# Patient Record
Sex: Male | Born: 1950 | Race: White | Hispanic: No | Marital: Single | Smoking: Former smoker
Health system: Southern US, Community
[De-identification: ages and names within clinical notes are randomized; demographics above are authoritative.]

## PROBLEM LIST (undated history)

## (undated) DIAGNOSIS — Q549 Hypospadias, unspecified: Secondary | ICD-10-CM

## (undated) DIAGNOSIS — I82621 Acute embolism and thrombosis of deep veins of right upper extremity: Secondary | ICD-10-CM

## (undated) DIAGNOSIS — Z720 Tobacco use: Secondary | ICD-10-CM

## (undated) DIAGNOSIS — K259 Gastric ulcer, unspecified as acute or chronic, without hemorrhage or perforation: Secondary | ICD-10-CM

## (undated) DIAGNOSIS — G9341 Metabolic encephalopathy: Secondary | ICD-10-CM

## (undated) DIAGNOSIS — E785 Hyperlipidemia, unspecified: Secondary | ICD-10-CM

## (undated) DIAGNOSIS — I639 Cerebral infarction, unspecified: Secondary | ICD-10-CM

## (undated) DIAGNOSIS — I2699 Other pulmonary embolism without acute cor pulmonale: Secondary | ICD-10-CM

## (undated) DIAGNOSIS — I824Z2 Acute embolism and thrombosis of unspecified deep veins of left distal lower extremity: Secondary | ICD-10-CM

## (undated) DIAGNOSIS — I219 Acute myocardial infarction, unspecified: Secondary | ICD-10-CM

## (undated) DIAGNOSIS — G9340 Encephalopathy, unspecified: Secondary | ICD-10-CM

## (undated) DIAGNOSIS — I10 Essential (primary) hypertension: Secondary | ICD-10-CM

---

## 2013-05-16 ENCOUNTER — Encounter (HOSPITAL_COMMUNITY): Payer: Self-pay | Admitting: Emergency Medicine

## 2013-05-16 ENCOUNTER — Inpatient Hospital Stay (HOSPITAL_COMMUNITY)
Admission: EM | Admit: 2013-05-16 | Discharge: 2013-05-19 | DRG: 065 | Disposition: A | Discharge status: Home Health | Payer: No Typology Code available for payment source | Attending: Internal Medicine | Admitting: Internal Medicine

## 2013-05-16 ENCOUNTER — Emergency Department (HOSPITAL_COMMUNITY): Payer: No Typology Code available for payment source

## 2013-05-16 DIAGNOSIS — E669 Obesity, unspecified: Secondary | ICD-10-CM | POA: Diagnosis present

## 2013-05-16 DIAGNOSIS — N179 Acute kidney failure, unspecified: Secondary | ICD-10-CM | POA: Diagnosis present

## 2013-05-16 DIAGNOSIS — I6509 Occlusion and stenosis of unspecified vertebral artery: Secondary | ICD-10-CM | POA: Diagnosis present

## 2013-05-16 DIAGNOSIS — H532 Diplopia: Secondary | ICD-10-CM | POA: Diagnosis present

## 2013-05-16 DIAGNOSIS — I6350 Cerebral infarction due to unspecified occlusion or stenosis of unspecified cerebral artery: Principal | ICD-10-CM | POA: Diagnosis present

## 2013-05-16 DIAGNOSIS — F17200 Nicotine dependence, unspecified, uncomplicated: Secondary | ICD-10-CM | POA: Diagnosis present

## 2013-05-16 DIAGNOSIS — I10 Essential (primary) hypertension: Secondary | ICD-10-CM | POA: Diagnosis present

## 2013-05-16 DIAGNOSIS — Z7902 Long term (current) use of antithrombotics/antiplatelets: Secondary | ICD-10-CM

## 2013-05-16 DIAGNOSIS — I639 Cerebral infarction, unspecified: Secondary | ICD-10-CM

## 2013-05-16 DIAGNOSIS — R4789 Other speech disturbances: Secondary | ICD-10-CM | POA: Diagnosis present

## 2013-05-16 DIAGNOSIS — Z23 Encounter for immunization: Secondary | ICD-10-CM

## 2013-05-16 DIAGNOSIS — H538 Other visual disturbances: Secondary | ICD-10-CM | POA: Diagnosis present

## 2013-05-16 DIAGNOSIS — E785 Hyperlipidemia, unspecified: Secondary | ICD-10-CM | POA: Diagnosis present

## 2013-05-16 DIAGNOSIS — Z7982 Long term (current) use of aspirin: Secondary | ICD-10-CM

## 2013-05-16 DIAGNOSIS — Z79899 Other long term (current) drug therapy: Secondary | ICD-10-CM

## 2013-05-16 HISTORY — DX: Gastric ulcer, unspecified as acute or chronic, without hemorrhage or perforation: K25.9

## 2013-05-16 HISTORY — DX: Essential (primary) hypertension: I10

## 2013-05-16 LAB — DIFFERENTIAL
Basophils Absolute: 0 10*3/uL (ref 0.0–0.1)
Basophils Relative: 0 % (ref 0–1)
EOS PCT: 1 % (ref 0–5)
Eosinophils Absolute: 0.2 10*3/uL (ref 0.0–0.7)
LYMPHS ABS: 1.5 10*3/uL (ref 0.7–4.0)
LYMPHS PCT: 13 % (ref 12–46)
Monocytes Absolute: 0.6 10*3/uL (ref 0.1–1.0)
Monocytes Relative: 5 % (ref 3–12)
Neutro Abs: 9.3 10*3/uL — ABNORMAL HIGH (ref 1.7–7.7)
Neutrophils Relative %: 80 % — ABNORMAL HIGH (ref 43–77)

## 2013-05-16 LAB — POCT I-STAT, CHEM 8
BUN: 22 mg/dL (ref 6–23)
CALCIUM ION: 1.15 mmol/L (ref 1.13–1.30)
Chloride: 102 mEq/L (ref 96–112)
Creatinine, Ser: 1.5 mg/dL — ABNORMAL HIGH (ref 0.50–1.35)
Glucose, Bld: 118 mg/dL — ABNORMAL HIGH (ref 70–99)
HEMATOCRIT: 44 % (ref 39.0–52.0)
HEMOGLOBIN: 15 g/dL (ref 13.0–17.0)
Potassium: 4 mEq/L (ref 3.7–5.3)
SODIUM: 138 meq/L (ref 137–147)
TCO2: 24 mmol/L (ref 0–100)

## 2013-05-16 LAB — COMPREHENSIVE METABOLIC PANEL
ALT: 11 U/L (ref 0–53)
AST: 17 U/L (ref 0–37)
Albumin: 3.6 g/dL (ref 3.5–5.2)
Alkaline Phosphatase: 69 U/L (ref 39–117)
BUN: 21 mg/dL (ref 6–23)
CHLORIDE: 99 meq/L (ref 96–112)
CO2: 22 meq/L (ref 19–32)
CREATININE: 1.42 mg/dL — AB (ref 0.50–1.35)
Calcium: 9.4 mg/dL (ref 8.4–10.5)
GFR, EST AFRICAN AMERICAN: 60 mL/min — AB (ref >90)
GFR, EST NON AFRICAN AMERICAN: 51 mL/min — AB (ref >90)
Glucose, Bld: 120 mg/dL — ABNORMAL HIGH (ref 70–99)
Potassium: 4.2 mEq/L (ref 3.7–5.3)
SODIUM: 137 meq/L (ref 137–147)
Total Protein: 7.3 g/dL (ref 6.0–8.3)

## 2013-05-16 LAB — CBC
HEMATOCRIT: 40.6 % (ref 39.0–52.0)
Hemoglobin: 14.4 g/dL (ref 13.0–17.0)
MCH: 30.7 pg (ref 26.0–34.0)
MCHC: 35.5 g/dL (ref 30.0–36.0)
MCV: 86.6 fL (ref 78.0–100.0)
Platelets: 248 10*3/uL (ref 150–400)
RBC: 4.69 MIL/uL (ref 4.22–5.81)
RDW: 12.9 % (ref 11.5–15.5)
WBC: 11.6 10*3/uL — ABNORMAL HIGH (ref 4.0–10.5)

## 2013-05-16 LAB — TROPONIN I: Troponin I: <0.3 ng/mL (ref <0.30)

## 2013-05-16 LAB — GLUCOSE, CAPILLARY: GLUCOSE-CAPILLARY: 113 mg/dL — AB (ref 70–99)

## 2013-05-16 LAB — ETHANOL: Alcohol, Ethyl (B): <11 mg/dL (ref 0–11)

## 2013-05-16 LAB — POCT I-STAT TROPONIN I: Troponin i, poc: 0 ng/mL (ref 0.00–0.08)

## 2013-05-16 LAB — PROTIME-INR
INR: 1.02 (ref 0.00–1.49)
Prothrombin Time: 13.2 seconds (ref 11.6–15.2)

## 2013-05-16 LAB — APTT: aPTT: 42 seconds — ABNORMAL HIGH (ref 24–37)

## 2013-05-16 MED ORDER — INFLUENZA VAC SPLIT QUAD 0.5 ML IM SUSP
0.5000 mL | INTRAMUSCULAR | Status: AC
  Administered 2013-05-17: 0.5 mL via INTRAMUSCULAR
  Filled 2013-05-16: qty 0.5

## 2013-05-16 MED ORDER — ASPIRIN 325 MG PO TABS
325.0000 mg | ORAL_TABLET | Freq: Every day | ORAL | Status: DC
Start: 2013-05-16 — End: 2013-05-17
  Administered 2013-05-17 (×2): 325 mg via ORAL
  Filled 2013-05-16 (×2): qty 1

## 2013-05-16 MED ORDER — HEPARIN (PORCINE) IN NACL 100-0.45 UNIT/ML-% IJ SOLN
900.0000 [IU]/h | INTRAMUSCULAR | Status: DC
  Administered 2013-05-16: 900 [IU]/h via INTRAVENOUS
  Filled 2013-05-16: qty 250

## 2013-05-16 MED ORDER — HEPARIN SODIUM (PORCINE) 5000 UNIT/ML IJ SOLN
5000.0000 [IU] | Freq: Three times a day (TID) | INTRAMUSCULAR | Status: DC
  Filled 2013-05-16: qty 1

## 2013-05-16 MED ORDER — CLOPIDOGREL BISULFATE 75 MG PO TABS: 75.0000 mg | ORAL_TABLET | Freq: Every day | ORAL | Status: DC

## 2013-05-16 NOTE — H&P (Signed)
[facility] History and Physical  Danton Gahan [id number] DOB: [birth year] DOA: 05/16/2013  Referring physician: EDP PCP: No PCP Per Patient   Chief Complaint: Diploplia   HPI: Luke Kaufman is a 63 y.o. male who presents to the ED with acute onset of diplopia and vertigo while driving at about [id number] pm this afternoon.  No history of stroke nor TIA that he knows about (old stroke demonstrated on imaging).  Also had slurred speech which cleared after arrival to the ED.  CT head shows and MRI confirms an old R lenticular nucleus / corona radiata small infarct.  MRI demonstrates a new infarct lateral to L thalamus.  Hospitalist admitting for CVA workup.  Review of Systems: Systems reviewed.  As above, otherwise negative  Past Medical History  Diagnosis Date  . Hypertension   . Gastric ulcer    History reviewed. No pertinent past surgical history. Social History:  reports that he has been smoking.  He does not have any smokeless tobacco history on file. He reports that he drinks alcohol. He reports that he does not use illicit drugs.  No Known Allergies  No family history on file.   Prior to Admission medications   Medication Sig Start Date End Date Taking? Authorizing Provider  ibuprofen (ADVIL,MOTRIN) 200 MG tablet Take 800 mg by mouth every 6 (six) hours as needed for headache.   Yes Historical Provider, MD  lisinopril (PRINIVIL,ZESTRIL) 10 MG tablet Take 5 mg by mouth daily.   Yes Historical Provider, MD  PRESCRIPTION MEDICATION Take 1 tablet by mouth daily.   Yes Historical Provider, MD  traMADol (ULTRAM) 50 MG tablet Take 50 mg by mouth every 6 (six) hours as needed.   Yes Historical Provider, MD   Physical Exam: Filed Vitals:   05/16/13 2145  BP:   Pulse:   Temp: 97.4 F (36.3 C)  Resp:     BP 180/91  Pulse 83  Temp(Src) 97.4 F (36.3 C) (Oral)  Resp 11  SpO2 100%  General Appearance:    Alert, oriented, no distress, appears stated age  Head:     Normocephalic, atraumatic  Eyes:    Gaze slightly dysconjugate.     Nose:   Nares without drainage or epistaxis. Mucosa, turbinates normal  Throat:   Moist mucous membranes. Oropharynx without erythema or exudate.  Neck:   Supple. No carotid bruits.  No thyromegaly.  No lymphadenopathy.   Back:     No CVA tenderness, no spinal tenderness  Lungs:     Clear to auscultation bilaterally, without wheezes, rhonchi or rales  Chest wall:    No tenderness to palpitation  Heart:    Regular rate and rhythm without murmurs, gallops, rubs  Abdomen:     Soft, non-tender, nondistended, normal bowel sounds, no organomegaly  Genitalia:    deferred  Rectal:    deferred  Extremities:   No clubbing, cyanosis or edema.  Pulses:   2+ and symmetric all extremities  Skin:   Skin color, texture, turgor normal, no rashes or lesions  Lymph nodes:   Cervical, supraclavicular, and axillary nodes normal  Neurologic:   CNII-XII intact. Normal strength, sensation and reflexes      throughout    Labs on Admission:  Basic Metabolic Panel:  Recent Labs Lab 05/16/13 1905 05/16/13 1921  NA 137 138  K 4.2 4.0  CL 99 102  CO2 22  --   GLUCOSE 120* 118*  BUN 21 22  CREATININE 1.42* 1.50*  CALCIUM 9.4  --  Liver Function Tests:  Recent Labs Lab 05/16/13 1905  AST 17  ALT 11  ALKPHOS 69  BILITOT <0.2*  PROT 7.3  ALBUMIN 3.6   No results found for this basename: LIPASE, AMYLASE,  in the last 168 hours No results found for this basename: AMMONIA,  in the last 168 hours CBC:  Recent Labs Lab 05/16/13 1905 05/16/13 1921  WBC 11.6*  --   NEUTROABS 9.3*  --   HGB 14.4 15.0  HCT 40.6 44.0  MCV 86.6  --   PLT 248  --    Cardiac Enzymes:  Recent Labs Lab 05/16/13 1905  TROPONINI <0.30    BNP (last 3 results) No results found for this basename: PROBNP,  in the last 8760 hours CBG:  Recent Labs Lab 05/16/13 1926  GLUCAP 113*    Radiological Exams on Admission: Ct Head Wo  Contrast  05/16/2013   CLINICAL DATA:  Diplopia.  Slurred speech.  Hypertensive patient.  EXAM: CT HEAD WITHOUT CONTRAST  TECHNIQUE: Contiguous axial images were obtained from the base of the skull through the vertex without intravenous contrast.  COMPARISON:  None.  FINDINGS: No intracranial hemorrhage.  Superior right lenticular nucleus/ corona radiata small infarct. This appears remote. Acute infarct less likely consideration although not entirely excluded.  Slightly dense appearance of the basilar tip/ proximal posterior cerebral arteries. This may be normal. Thrombus not excluded in the proper clinical setting.  Hypodensity right pons may be related to streak artifact limiting detection for presence of small acute infarct.  No hydrocephalus.  No intracranial mass lesion noted on this unenhanced exam.  Vascular calcifications.  Bilateral mastoid air cell opacification without destruction of the septi. No obvious mass posterior superior nasopharynx detected as cause of eustachian tube dysfunction. This is incompletely assessed on present exam.  IMPRESSION: No intracranial hemorrhage.  Superior right lenticular nucleus/ corona radiata small infarct. This appears remote. Acute infarct less likely consideration although not entirely excluded.  Slightly dense appearance of the basilar tip/ proximal posterior cerebral arteries. This may be normal. Thrombus not excluded in the proper clinical setting.  Hypodensity right pons may be related to streak artifact limiting detection for presence of small acute infarct.  Bilateral mastoid air cell opacification  Images reviewed with Dr. Nicole Kindred  on 05/16/2013  at 7:37 PM.   Electronically Signed   By: Chauncey Cruel M.D.   On: 05/16/2013 19:45   Mr Jodene Nam Head Wo Contrast  05/16/2013   CLINICAL DATA:  Slurred speech.  Blurred vision.  EXAM: MRI HEAD WITHOUT CONTRAST  MRA HEAD WITHOUT CONTRAST  TECHNIQUE: Multiplanar, multiecho pulse sequences of the brain and surrounding  structures were obtained without intravenous contrast. Angiographic images of the head were obtained using MRA technique without contrast.  COMPARISON:  05/16/2013 head CT.  No comparison brain MR.  FINDINGS: MRI HEAD FINDINGS  Some of the sequences are motion degraded.  Acute small to slightly moderate size infarct lateral to the left thalamus, posterior aspect of the posterior limb of the left internal capsule, posterior to the left lenticular nucleus and adjacent to the posterior margin of the left operculum.  T2 shine through from remote infarct mid right coronal radiata.  No intracranial hemorrhage.  Mild slightly moderate small vessel disease type changes.  Mild atrophy without hydrocephalus.  No intracranial mass lesion noted on this unenhanced exam.  Bilateral mastoid air cell middle ear opacification without obstructing lesion seen in the region of the posterior superior nasopharynx causing eustachian tube dysfunction.  Minimal to mild paranasal sinus mucosal thickening.  Cervical medullary junction, pituitary region, pineal region and orbital structures unremarkable.  MRA HEAD FINDINGS  Exam is motion degraded.  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 Partial reconstitution of flow distal aspect of the left vertebral artery. Etiology indeterminate. Considerations include atherosclerotic changes versus dissection.  No significant stenosis of the distal vertebral artery.  No significant stenosis of the basilar artery.  Mild irregularity of portions of the superior cerebral arteries and posterior cerebral arteries. No high-grade stenosis.  Mild narrowing, irregularity and areas of ectasia involving the cavernous segment and supraclinoid segment of the internal carotid arteries bilaterally.  Mild narrowing distal M1 segment right middle cerebral artery extending into the bifurcation.  Moderate to marked narrowing proximal left middle cerebral artery branch vessels with  mild narrowing of middle cerebral artery branch vessels bilaterally.  Mild to moderate narrowing A2 segment left anterior cerebral artery.  No aneurysm noted.  IMPRESSION: MRI HEAD:  Exam is motion degraded  Acute small to slightly moderate size infarct lateral to the left thalamus, posterior aspect of the posterior limb of the left internal capsule, posterior to the left lenticular nucleus and adjacent to the posterior margin of the left operculum.  Remote infarct mid right coronal radiata.  No intracranial hemorrhage.  Mild to slightly moderate small vessel disease type changes.  Mild atrophy.  Bilateral mastoid air cell middle ear opacification. Minimal to mild paranasal sinus mucosal thickening.  MRA HEAD:  Exam is motion degraded.  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 Partial reconstitution of flow distal aspect of the left vertebral artery. Etiology indeterminate. Considerations include atherosclerotic changes versus dissection.  Intracranial atherosclerotic type changes otherwise as noted above  Results discussed with the Dr. Nicole Kindred at the time of imaging.   Electronically Signed   By: Chauncey Cruel M.D.   On: 05/16/2013 21:43   Mr Brain Wo Contrast  05/16/2013   CLINICAL DATA:  Slurred speech.  Blurred vision.  EXAM: MRI HEAD WITHOUT CONTRAST  MRA HEAD WITHOUT CONTRAST  TECHNIQUE: Multiplanar, multiecho pulse sequences of the brain and surrounding structures were obtained without intravenous contrast. Angiographic images of the head were obtained using MRA technique without contrast.  COMPARISON:  05/16/2013 head CT.  No comparison brain MR.  FINDINGS: MRI HEAD FINDINGS  Some of the sequences are motion degraded.  Acute small to slightly moderate size infarct lateral to the left thalamus, posterior aspect of the posterior limb of the left internal capsule, posterior to the left lenticular nucleus and adjacent to the posterior margin of the left operculum.  T2  shine through from remote infarct mid right coronal radiata.  No intracranial hemorrhage.  Mild slightly moderate small vessel disease type changes.  Mild atrophy without hydrocephalus.  No intracranial mass lesion noted on this unenhanced exam.  Bilateral mastoid air cell middle ear opacification without obstructing lesion seen in the region of the posterior superior nasopharynx causing eustachian tube dysfunction. Minimal to mild paranasal sinus mucosal thickening.  Cervical medullary junction, pituitary region, pineal region and orbital structures unremarkable.  MRA HEAD FINDINGS  Exam is motion degraded.  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 Partial reconstitution of flow distal aspect of the left vertebral artery. Etiology indeterminate. Considerations include atherosclerotic changes versus dissection.  No significant stenosis of the distal vertebral artery.  No significant stenosis of the basilar artery.  Mild irregularity of portions of the superior  cerebral arteries and posterior cerebral arteries. No high-grade stenosis.  Mild narrowing, irregularity and areas of ectasia involving the cavernous segment and supraclinoid segment of the internal carotid arteries bilaterally.  Mild narrowing distal M1 segment right middle cerebral artery extending into the bifurcation.  Moderate to marked narrowing proximal left middle cerebral artery branch vessels with mild narrowing of middle cerebral artery branch vessels bilaterally.  Mild to moderate narrowing A2 segment left anterior cerebral artery.  No aneurysm noted.  IMPRESSION: MRI HEAD:  Exam is motion degraded  Acute small to slightly moderate size infarct lateral to the left thalamus, posterior aspect of the posterior limb of the left internal capsule, posterior to the left lenticular nucleus and adjacent to the posterior margin of the left operculum.  Remote infarct mid right coronal radiata.  No intracranial  hemorrhage.  Mild to slightly moderate small vessel disease type changes.  Mild atrophy.  Bilateral mastoid air cell middle ear opacification. Minimal to mild paranasal sinus mucosal thickening.  MRA HEAD:  Exam is motion degraded.  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 Partial reconstitution of flow distal aspect of the left vertebral artery. Etiology indeterminate. Considerations include atherosclerotic changes versus dissection.  Intracranial atherosclerotic type changes otherwise as noted above  Results discussed with the Dr. Nicole Kindred at the time of imaging.   Electronically Signed   By: Chauncey Cruel M.D.   On: 05/16/2013 21:43    EKG: Independently reviewed.  Assessment/Plan Principal Problem:   CVA (cerebral infarction) Active Problems:   HTN (hypertension)   Acute ischemic stroke - on stroke pathway, Dr. Nicole Kindred has seen the patient for neurology, work up ordered, MRI does confirm a new ischemic stroke.  ASA for antiplatelet.  Tele monitor, 2d echo, carotid dopplers all ordered.  Allowing permissive HTN and holding lisinopril given evidence of acute ischemic stroke.    Code Status: Full Code  Family Communication: No family in room Disposition Plan: Admit to inpatient   Time spent: 70 min  Tye Juarez M. [facility] Pager [phone]  If 7AM-7PM, please contact the day team taking care of the patient [email or web address] Password [facility] 05/16/2013, 9:51 PM

## 2013-05-16 NOTE — ED Notes (Signed)
CBG CHECKED 113

## 2013-05-16 NOTE — Progress Notes (Signed)
ANTICOAGULATION CONSULT NOTE - Initial Consult  Pharmacy Consult for Heparin  Indication: Vertebral artery occlusion, acute stroke  No Known Allergies  Patient Measurements: Height: 5\' 7"  (170.2 cm) Weight: 241 lb 3.2 oz (109.408 kg) IBW/kg (Calculated) : 66.1 Heparin Dosing Weight: ~90 kg  Vital Signs: Temp: 98.4 F (36.9 C) (01/25 2237) Temp src: Oral (01/25 2141) BP: 104/66 mmHg (01/25 2237) Pulse Rate: 80 (01/25 2237)  Labs:  Recent Labs  05/16/13 1905 05/16/13 1921  HGB 14.4 15.0  HCT 40.6 44.0  PLT 248  --   APTT 42*  --   LABPROT 13.2  --   INR 1.02  --   CREATININE 1.42* 1.50*  TROPONINI <0.30  --    Estimated Creatinine Clearance: 60.2 ml/min (by C-G formula based on Cr of 1.5).  Medical History: Past Medical History  Diagnosis Date  . Hypertension   . Gastric ulcer    Assessment: 63 y/o CODE STROKE (NO tpa given) found to have vertebral artery occlusion/acute stroke to start LOW dose heparin drip with NO BOLUS at the request of neurology. Labs as above, noted slightly elevated Scr.   Goal of Therapy:  Heparin level 0.3-0.5 units/ml Monitor platelets by anticoagulation protocol: Yes   Plan:  -Start heparin drip at 900 units/hr (NO BOLUS) -8 hour HL at 0800 -Daily CBC/HL -Monitor for bleeding, changes in mental status  Narda Bonds 05/16/2013,11:05 PM

## 2013-05-16 NOTE — ED Provider Notes (Signed)
CSN: [id number]     Arrival date & time 05/16/13  1902 History   First MD Initiated Contact with Patient 05/16/13 1903     Chief Complaint  Patient presents with  . Code Stroke   (Consider location/radiation/quality/duration/timing/severity/associated sxs/prior Treatment) HPI A LEVEL 5 CAVEAT PERTAINS DUE TO ALTERED MENTAL STATUS AND URGENT NEED FOR INTERVENTION.  Pt presents with c/o sudden onset at 4pm of changes in speech, blurry vision.  Per EMS he was initially coherent, but then became confused during EMS transport.  No report of focal weakness.  Pt was seen as a code stroke notification.   Past Medical History  Diagnosis Date  . Hypertension   . Gastric ulcer    History reviewed. No pertinent past surgical history. No family history on file. History  Substance Use Topics  . Smoking status: Current Every Day Smoker -- 1.50 packs/day  . Smokeless tobacco: Not on file  . Alcohol Use: Yes     Comment: occasional    Review of Systems UNABLE TO OBTAIN ROS DUE TO LEVEL 5 CAVEAT  Allergies  Review of patient's allergies indicates no known allergies.  Home Medications   Current Outpatient Rx  Name  Route  Sig  Dispense  Refill  . amLODipine (NORVASC) 10 MG tablet   Oral   Take 1 tablet (10 mg total) by mouth daily.   30 tablet   0   . aspirin EC 81 MG EC tablet   Oral   Take 1 tablet (81 mg total) by mouth daily.   30 tablet   0   . clopidogrel (PLAVIX) 75 MG tablet   Oral   Take 1 tablet (75 mg total) by mouth daily with breakfast.   30 tablet   0   . nicotine (NICODERM CQ - DOSED IN MG/24 HOURS) 14 mg/24hr patch   Transdermal   Place 1 patch (14 mg total) onto the skin daily.   28 patch   0   . simvastatin (ZOCOR) 20 MG tablet   Oral   Take 1 tablet (20 mg total) by mouth daily at 6 PM.   30 tablet   0    BP 154/53  Pulse 87  Temp(Src) 98.9 F (37.2 C) (Oral)  Resp 18  Ht 5\' 7"  (1.702 m)  Wt 241 lb 3.2 oz (109.408 kg)  BMI 37.77 kg/m2  SpO2  97% Vitals reviewed Physical Exam Physical Examination: General appearance - alert, well appearing, and in no distress Mental status - alert, on initial evaluation pt unable to answer orientation questions, on repeat exam after head CT he was oriented x 3 Eyes - pupils equal and reactive, extraocular eye movements intact Mouth - mucous membranes moist, pharynx normal without lesions Chest - clear to auscultation, no wheezes, rales or rhonchi, symmetric air entry Heart - normal rate, regular rhythm, normal S1, S2, no murmurs, rubs, clicks or gallops Abdomen - soft, nontender, nondistended, no masses or organomegaly Neurological - alert, speaking in words but not giving correct answers to questions, strength 5/5 in extremities x 4, sensation intact, cranial nerves 2-12 tested and intact Extremities - peripheral pulses normal, no pedal edema, no clubbing or cyanosis Skin - normal coloration and turgor, no rashes  ED Course  Procedures (including critical care time)  CRITICAL CARE Performed by: Threasa Beards Total critical care time: 40 Critical care time was exclusive of separately billable procedures and treating other patients. Critical care was necessary to treat or prevent imminent or life-threatening deterioration. Critical  care was time spent personally by me on the following activities: development of treatment plan with patient and/or surrogate as well as nursing, discussions with consultants, evaluation of patient's response to treatment, examination of patient, obtaining history from patient or surrogate, ordering and performing treatments and interventions, ordering and review of laboratory studies, ordering and review of radiographic studies, pulse oximetry and re-evaluation of patient's condition.   9:19 PM neurology has seen patient and recommends MR/MRA and requests hospitalist admit.  D/w Dr. Alcario Drought- pt to be admitted to tele bed, inpatient Labs Review Labs Reviewed  APTT  - Abnormal; Notable for the following:    aPTT 42 (*)    All other components within normal limits  CBC - Abnormal; Notable for the following:    WBC 11.6 (*)    All other components within normal limits  DIFFERENTIAL - Abnormal; Notable for the following:    Neutrophils Relative % 80 (*)    Neutro Abs 9.3 (*)    All other components within normal limits  COMPREHENSIVE METABOLIC PANEL - Abnormal; Notable for the following:    Glucose, Bld 120 (*)    Creatinine, Ser 1.42 (*)    Total Bilirubin <0.2 (*)    GFR calc non Af Amer 51 (*)    GFR calc Af Amer 60 (*)    All other components within normal limits  GLUCOSE, CAPILLARY - Abnormal; Notable for the following:    Glucose-Capillary 113 (*)    All other components within normal limits  HEMOGLOBIN A1C - Abnormal; Notable for the following:    Hemoglobin A1C 5.7 (*)    Mean Plasma Glucose 117 (*)    All other components within normal limits  LIPID PANEL - Abnormal; Notable for the following:    Triglycerides 200 (*)    HDL 25 (*)    LDL Cholesterol 105 (*)    All other components within normal limits  BASIC METABOLIC PANEL - Abnormal; Notable for the following:    Glucose, Bld 111 (*)    Creatinine, Ser 1.42 (*)    GFR calc non Af Amer 51 (*)    GFR calc Af Amer 60 (*)    All other components within normal limits  BASIC METABOLIC PANEL - Abnormal; Notable for the following:    Creatinine, Ser 1.56 (*)    GFR calc non Af Amer 46 (*)    GFR calc Af Amer 53 (*)    All other components within normal limits  POCT I-STAT, CHEM 8 - Abnormal; Notable for the following:    Creatinine, Ser 1.50 (*)    Glucose, Bld 118 (*)    All other components within normal limits  ETHANOL  PROTIME-INR  TROPONIN I  URINE RAPID DRUG SCREEN (HOSP PERFORMED)  URINALYSIS, ROUTINE W REFLEX MICROSCOPIC  POCT I-STAT TROPONIN I   Imaging Review No results found.  EKG Interpretation    Date/Time:  Sunday May 16 2013 19:17:55 EST Ventricular  Rate:  90 PR Interval:  173 QRS Duration: 87 QT Interval:  366 QTC Calculation: 448 R Axis:   -11 Text Interpretation:  Sinus rhythm Borderline repolarization abnormality ED PHYSICIAN INTERPRETATION AVAILABLE IN [facility] Confirmed by TEST, RECORD ([id number]) on 05/18/2013 9:28:43 AM           Date: 05/16/2013  Rate: 90  Rhythm: normal sinus rhythm  QRS Axis: normal  Intervals: normal  ST/T Wave abnormalities: normal  Conduction Disutrbances:none  Narrative Interpretation:   Old EKG Reviewed: none available EKG  locked in MUSE and not able to add interpretation  MDM   1. CVA (cerebral infarction)   2. HTN (hypertension)   3. Vertebral artery occlusion   4. Acute renal failure   5. Tobacco use disorder     Pt was seen by myself and neurology as a code stroke notification. Head CT showed a possible area of acute stroke.  MR/MRA ordered as well.  Pt will need admission to [facility] and further stroke workup.  His symptoms were beginning to improve during my rechecks.     Threasa Beards, MD 05/19/13 [phone]

## 2013-05-16 NOTE — ED Notes (Addendum)
Pt to ED via [facility] - code stroke called in route.  Pt presents with blurred vision and pressure behind his left eye- for EMS pt initially alert and oriented X 4, pt developed some confusion in EMS.  Alert upon arrival to ED, no slurred speech noted.  CBG 138, BP 156/99, IV in place, sinus rhythm on monitor.

## 2013-05-16 NOTE — Consult Note (Signed)
Referring Physician: Dr. Canary Brim    Chief Complaint: Acute onset of diplopia and vertigo.  HPI: Luke Kaufman is an 63 y.o. male history of hypertension who experienced acute onset of diplopia and vertigo while driving at about [id number] PM this afternoon. He has no previous history of stroke nor TIA. He has not been on antiplatelet therapy. He also has slurred speech which cleared after arriving in the emergency room. No focal weakness nor focal numbness was noted. CT scan of his head showed probable old right lenticular nucleus/corona radiata small infarct. Slight  hyperdensity of the basilar tip and proximal cerebral arteries was noted. Thrombus cannot be excluded. There was also an area of possible hypodensity involving the right pons likely artifact. NIH stroke score was 0. Patient was not deemed a candidate for TPA because of minimal residual symptoms. MRI study showed an acute small to slightly moderate size infarct lateral to the left thalamus, posterior aspect of the posterior limb of the left  internal capsule, posterior to the left lenticular nucleus and adjacent to the posterior margin of the left operculum. 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 Etiology considerations included atherosclerotic changes as well as possible dissection.  LSN: 3:30 PM on 05/16/2013 tPA Given: No: Minimal residual deficits with 0 NIH score MRankin: 1  Past Medical History  Diagnosis Date  . Hypertension   . Gastric ulcer     No family history on file.   Medications: I have reviewed the patient's current medications.  ROS: History obtained from the patient  General ROS: negative for - chills, fatigue, fever, night sweats, weight gain or weight loss Psychological ROS: negative for - behavioral disorder, hallucinations, memory difficulties, mood swings or suicidal ideation Ophthalmic ROS: negative for - blurry vision, double vision,  eye pain or loss of vision ENT ROS: negative for - epistaxis, nasal discharge, oral lesions, sore throat, tinnitus or vertigo Allergy and Immunology ROS: negative for - hives or itchy/watery eyes Hematological and Lymphatic ROS: negative for - bleeding problems, bruising or swollen lymph nodes Endocrine ROS: negative for - galactorrhea, hair pattern changes, polydipsia/polyuria or temperature intolerance Respiratory ROS: negative for - cough, hemoptysis, shortness of breath or wheezing Cardiovascular ROS: negative for - chest pain, dyspnea on exertion, edema or irregular heartbeat Gastrointestinal ROS: negative for - abdominal pain, diarrhea, hematemesis, nausea/vomiting or stool incontinence Genito-Urinary ROS: negative for - dysuria, hematuria, incontinence or urinary frequency/urgency Musculoskeletal ROS: negative for - joint swelling or muscular weakness Neurological ROS: as noted in HPI Dermatological ROS: negative for rash and skin lesion changes  Physical Examination: Blood pressure 166/91, pulse 90, temperature 98.5 F (36.9 C), temperature source Oral, resp. rate 12, SpO2 93.00%.  Neurologic Examination: Mental Status: Alert, oriented, thought content appropriate.  Speech fluent without evidence of aphasia. Able to follow commands without difficulty. Cranial Nerves: II-Visual fields were normal. III/IV/VI-Pupils were equal and reacted. Extraocular movements were full and conjugate; patient had diplopia in all fields of gaze with slightly worse diplopia on right gaze compared to left.    V/VII-no facial numbness and no facial weakness. VIII-normal. X-normal speech. Motor: 5/5 bilaterally with normal tone and bulk Sensory: Normal throughout. Deep Tendon Reflexes: 2+ and symmetric. Plantars: Mute bilaterally Cerebellar: Normal finger-to-nose testing. Carotid auscultation: Normal  Ct Head Wo Contrast  05/16/2013   CLINICAL DATA:  Diplopia.  Slurred speech.  Hypertensive  patient.  EXAM: CT HEAD WITHOUT CONTRAST  TECHNIQUE: Contiguous axial images were obtained from  the base of the skull through the vertex without intravenous contrast.  COMPARISON:  None.  FINDINGS: No intracranial hemorrhage.  Superior right lenticular nucleus/ corona radiata small infarct. This appears remote. Acute infarct less likely consideration although not entirely excluded.  Slightly dense appearance of the basilar tip/ proximal posterior cerebral arteries. This may be normal. Thrombus not excluded in the proper clinical setting.  Hypodensity right pons may be related to streak artifact limiting detection for presence of small acute infarct.  No hydrocephalus.  No intracranial mass lesion noted on this unenhanced exam.  Vascular calcifications.  Bilateral mastoid air cell opacification without destruction of the septi. No obvious mass posterior superior nasopharynx detected as cause of eustachian tube dysfunction. This is incompletely assessed on present exam.  IMPRESSION: No intracranial hemorrhage.  Superior right lenticular nucleus/ corona radiata small infarct. This appears remote. Acute infarct less likely consideration although not entirely excluded.  Slightly dense appearance of the basilar tip/ proximal posterior cerebral arteries. This may be normal. Thrombus not excluded in the proper clinical setting.  Hypodensity right pons may be related to streak artifact limiting detection for presence of small acute infarct.  Bilateral mastoid air cell opacification  Images reviewed with Dr. Nicole Kindred  on 05/16/2013  at 7:37 PM.   Electronically Signed   By: Chauncey Cruel M.D.   On: 05/16/2013 19:45    Assessment: 63 y.o. male no history of hypertension presenting with small acute left distal PCA territory infarction. Patient also has persistent diplopia as well as vertigo which is not explained by his left PCA infarction and and may well indicate acute midbrain or pontine infarction. Given changes on MRA,  multiple emboli of posterior circulation arterial source cannot be ruled out.  Stroke Risk Factors - hypertension, smoker.  Plan: 1. HgbA1c, fasting lipid panel 2. PT consult, OT consult 3. Echocardiogram 4. Carotid dopplers 5. Prophylactic therapy-anticoagulation with heparin IV, low dose with no bolus. 6. Risk factor modification 7. Possible cerebral catheter angiography   C.R. Nicole Kindred, MD [facility]  05/16/2013, 8:05 PM

## 2013-05-16 NOTE — ED Notes (Signed)
Pt still in MRI 

## 2013-05-17 DIAGNOSIS — F17200 Nicotine dependence, unspecified, uncomplicated: Secondary | ICD-10-CM

## 2013-05-17 DIAGNOSIS — I379 Nonrheumatic pulmonary valve disorder, unspecified: Secondary | ICD-10-CM

## 2013-05-17 DIAGNOSIS — N179 Acute kidney failure, unspecified: Secondary | ICD-10-CM

## 2013-05-17 LAB — RAPID URINE DRUG SCREEN, HOSP PERFORMED
Amphetamines: NOT DETECTED
BARBITURATES: NOT DETECTED
Benzodiazepines: NOT DETECTED
COCAINE: NOT DETECTED
Opiates: NOT DETECTED
TETRAHYDROCANNABINOL: NOT DETECTED

## 2013-05-17 LAB — URINALYSIS, ROUTINE W REFLEX MICROSCOPIC
BILIRUBIN URINE: NEGATIVE
Glucose, UA: NEGATIVE mg/dL
Hgb urine dipstick: NEGATIVE
Ketones, ur: NEGATIVE mg/dL
LEUKOCYTES UA: NEGATIVE
NITRITE: NEGATIVE
PH: 5.5 (ref 5.0–8.0)
Protein, ur: NEGATIVE mg/dL
SPECIFIC GRAVITY, URINE: 1.016 (ref 1.005–1.030)
UROBILINOGEN UA: 0.2 mg/dL (ref 0.0–1.0)

## 2013-05-17 LAB — HEMOGLOBIN A1C
Hgb A1c MFr Bld: 5.7 % — ABNORMAL HIGH (ref <5.7)
Mean Plasma Glucose: 117 mg/dL — ABNORMAL HIGH (ref <117)

## 2013-05-17 LAB — LIPID PANEL
Cholesterol: 170 mg/dL (ref 0–200)
HDL: 25 mg/dL — AB (ref >39)
LDL CALC: 105 mg/dL — AB (ref 0–99)
TRIGLYCERIDES: 200 mg/dL — AB (ref <150)
Total CHOL/HDL Ratio: 6.8 RATIO
VLDL: 40 mg/dL (ref 0–40)

## 2013-05-17 MED ORDER — OXYCODONE HCL 5 MG PO TABS
5.0000 mg | ORAL_TABLET | Freq: Four times a day (QID) | ORAL | Status: DC | PRN
  Administered 2013-05-17 – 2013-05-18 (×2): 5 mg via ORAL
  Filled 2013-05-17 (×2): qty 1

## 2013-05-17 MED ORDER — ASPIRIN EC 81 MG PO TBEC
81.0000 mg | DELAYED_RELEASE_TABLET | Freq: Every day | ORAL | Status: DC
  Administered 2013-05-18 – 2013-05-19 (×2): 81 mg via ORAL
  Filled 2013-05-17 (×2): qty 1

## 2013-05-17 MED ORDER — SODIUM CHLORIDE 0.9 % IV SOLN
INTRAVENOUS | Status: DC
  Administered 2013-05-17 – 2013-05-19 (×4): via INTRAVENOUS

## 2013-05-17 MED ORDER — ACETAMINOPHEN 325 MG PO TABS
650.0000 mg | ORAL_TABLET | Freq: Four times a day (QID) | ORAL | Status: DC | PRN
  Administered 2013-05-17 (×2): 650 mg via ORAL
  Filled 2013-05-17 (×2): qty 2

## 2013-05-17 MED ORDER — SIMVASTATIN 20 MG PO TABS
20.0000 mg | ORAL_TABLET | Freq: Every day | ORAL | Status: DC
  Administered 2013-05-17 – 2013-05-18 (×2): 20 mg via ORAL
  Filled 2013-05-17 (×3): qty 1

## 2013-05-17 MED ORDER — CLOPIDOGREL BISULFATE 75 MG PO TABS
75.0000 mg | ORAL_TABLET | Freq: Every day | ORAL | Status: DC
  Administered 2013-05-17 – 2013-05-19 (×3): 75 mg via ORAL
  Filled 2013-05-17 (×3): qty 1

## 2013-05-17 MED ORDER — MORPHINE SULFATE 2 MG/ML IJ SOLN
1.0000 mg | INTRAMUSCULAR | Status: DC | PRN
  Administered 2013-05-17: 1 mg via INTRAVENOUS
  Filled 2013-05-17: qty 1

## 2013-05-17 MED ORDER — ONDANSETRON HCL 4 MG/2ML IJ SOLN
4.0000 mg | Freq: Four times a day (QID) | INTRAMUSCULAR | Status: DC | PRN
  Administered 2013-05-17: 4 mg via INTRAVENOUS
  Filled 2013-05-17: qty 2

## 2013-05-17 MED ORDER — LISINOPRIL 5 MG PO TABS: 5.0000 mg | ORAL_TABLET | Freq: Every day | ORAL | Status: DC

## 2013-05-17 MED ORDER — ENOXAPARIN SODIUM 40 MG/0.4ML ~~LOC~~ SOLN
40.0000 mg | SUBCUTANEOUS | Status: DC
  Administered 2013-05-17 – 2013-05-18 (×2): 40 mg via SUBCUTANEOUS
  Filled 2013-05-17 (×3): qty 0.4

## 2013-05-17 MED ORDER — NICOTINE 14 MG/24HR TD PT24
14.0000 mg | MEDICATED_PATCH | Freq: Every day | TRANSDERMAL | Status: DC
  Administered 2013-05-17 – 2013-05-19 (×3): 14 mg via TRANSDERMAL
  Filled 2013-05-17 (×3): qty 1

## 2013-05-17 MED ORDER — OXYCODONE HCL 5 MG PO TABS
5.0000 mg | ORAL_TABLET | Freq: Four times a day (QID) | ORAL | Status: DC | PRN
  Administered 2013-05-17: 5 mg via ORAL
  Filled 2013-05-17: qty 1

## 2013-05-17 MED ORDER — AMLODIPINE BESYLATE 5 MG PO TABS
5.0000 mg | ORAL_TABLET | Freq: Every day | ORAL | Status: DC
  Administered 2013-05-17 – 2013-05-18 (×2): 5 mg via ORAL
  Filled 2013-05-17 (×2): qty 1

## 2013-05-17 NOTE — Progress Notes (Signed)
PATIENT DETAILS Name: Luke Kaufman Age: 63 y.o. Sex: male Date of Birth: Apr 12, 1951 Admit Date: 05/16/2013 Admitting Physician Etta Quill, DO PCP:No PCP Per Patient  Subjective: Main complaint this morning is Headache and Diplopia.  Assessment/Plan: Principal Problem:   Acute CVA (cerebral infarction) - Complains of vertigo and diplopia this morning, although MRI brain showed acute infarct in the left thalamus/internal capsule area, it does not explain patient's ongoing symptoms. Current suspicion is for a small brainstem CVA that is not seen on the MRI. On admission, because of concern for woodworking dissection/active thrombus in the left vertebral artery, was started on heparin and by neurology. However after evaluation by the stroke team, this has been discontinued. Now on aspirin and Plavix. Continue statins -Await further work up-Echo, Doppler, and a CTA Brain-once creatinine improves further after IVF hydration -LDL-105 -A1C-5.7  HTN -Hold lisinopril given mild renal insufficiency, start amlodipine  Acute renal failure - Hydrate, recheck creatinine in a.m. UA shows no proteinuria  Tobacco abuse - Transdermal nicotine  Disposition: Remain inpatient  DVT Prophylaxis: Prophylactic Lovenox   Code Status: Full code   Family Communication None at bedside  Procedures:  None  CONSULTS:  neurology  Time spent 40 minutes-which includes 50% of the time with face-to-face with patient/ family and coordinating care related to the above assessment and plan.    MEDICATIONS: Scheduled Meds: . aspirin EC  81 mg Oral Daily  . clopidogrel  75 mg Oral Q breakfast  . nicotine  14 mg Transdermal Daily  . simvastatin  20 mg Oral q1800   Continuous Infusions: . sodium chloride 75 mL/hr at 05/17/13 1138   PRN Meds:.acetaminophen, oxyCODONE  Antibiotics: Anti-infectives   None       PHYSICAL EXAM: Vital signs in last 24 hours: Filed Vitals:   05/17/13 0200 05/17/13 0400 05/17/13 0606 05/17/13 1207  BP: 155/77 128/51 144/83 183/65  Pulse: 72 65 68 73  Temp: 97.5 F (36.4 C) 98.1 F (36.7 C) 98 F (36.7 C) 98.6 F (37 C)  TempSrc: Oral  Oral Oral  Resp: 18 20 18 18   Height:      Weight:      SpO2: 99% 92% 97% 100%    Weight change:  Filed Weights   05/16/13 2237  Weight: 109.408 kg (241 lb 3.2 oz)   Body mass index is 37.77 kg/(m^2).   Gen Exam: Awake and alert with clear speech.   Neck: Supple, No JVD.   Chest: B/L Clear.   CVS: S1 S2 Regular, no murmurs.  Abdomen: soft, BS +, non tender, non distended.  Extremities: no edema, lower extremities warm to touch. Neurologic: Non Focal. Horizontal diplopia in all fields of gaze. Skin: No Rash.   Wounds: N/A.    Intake/Output from previous day: No intake or output data in the 24 hours ending 05/17/13 1408   LAB RESULTS: CBC  Recent Labs Lab 05/16/13 1905 05/16/13 1921  WBC 11.6*  --   HGB 14.4 15.0  HCT 40.6 44.0  PLT 248  --   MCV 86.6  --   MCH 30.7  --   MCHC 35.5  --   RDW 12.9  --   LYMPHSABS 1.5  --   MONOABS 0.6  --   EOSABS 0.2  --   BASOSABS 0.0  --     Chemistries   Recent Labs Lab 05/16/13 1905 05/16/13 1921  NA 137 138  K 4.2 4.0  CL  99 102  CO2 22  --   GLUCOSE 120* 118*  BUN 21 22  CREATININE 1.42* 1.50*  CALCIUM 9.4  --     CBG:  Recent Labs Lab 05/16/13 1926  GLUCAP 113*    GFR Estimated Creatinine Clearance: 60.2 ml/min (by C-G formula based on Cr of 1.5).  Coagulation profile  Recent Labs Lab 05/16/13 1905  INR 1.02    Cardiac Enzymes  Recent Labs Lab 05/16/13 1905  TROPONINI <0.30    No components found with this basename: POCBNP,  No results found for this basename: DDIMER,  in the last 72 hours  Recent Labs  05/17/13 0415  HGBA1C 5.7*    Recent Labs  05/17/13 0415  CHOL 170  HDL 25*  LDLCALC 105*  TRIG 200*  CHOLHDL 6.8   No results found for this basename: TSH, T4TOTAL,  FREET3, T3FREE, THYROIDAB,  in the last 72 hours No results found for this basename: VITAMINB12, FOLATE, FERRITIN, TIBC, IRON, RETICCTPCT,  in the last 72 hours No results found for this basename: LIPASE, AMYLASE,  in the last 72 hours  Urine Studies No results found for this basename: UACOL, UAPR, USPG, UPH, UTP, UGL, UKET, UBIL, UHGB, UNIT, UROB, ULEU, UEPI, UWBC, URBC, UBAC, CAST, CRYS, UCOM, BILUA,  in the last 72 hours  MICROBIOLOGY: No results found for this or any previous visit (from the past 240 hour(s)).  RADIOLOGY STUDIES/RESULTS: Ct Head Wo Contrast  05/16/2013   CLINICAL DATA:  Diplopia.  Slurred speech.  Hypertensive patient.  EXAM: CT HEAD WITHOUT CONTRAST  TECHNIQUE: Contiguous axial images were obtained from the base of the skull through the vertex without intravenous contrast.  COMPARISON:  None.  FINDINGS: No intracranial hemorrhage.  Superior right lenticular nucleus/ corona radiata small infarct. This appears remote. Acute infarct less likely consideration although not entirely excluded.  Slightly dense appearance of the basilar tip/ proximal posterior cerebral arteries. This may be normal. Thrombus not excluded in the proper clinical setting.  Hypodensity right pons may be related to streak artifact limiting detection for presence of small acute infarct.  No hydrocephalus.  No intracranial mass lesion noted on this unenhanced exam.  Vascular calcifications.  Bilateral mastoid air cell opacification without destruction of the septi. No obvious mass posterior superior nasopharynx detected as cause of eustachian tube dysfunction. This is incompletely assessed on present exam.  IMPRESSION: No intracranial hemorrhage.  Superior right lenticular nucleus/ corona radiata small infarct. This appears remote. Acute infarct less likely consideration although not entirely excluded.  Slightly dense appearance of the basilar tip/ proximal posterior cerebral arteries. This may be normal. Thrombus  not excluded in the proper clinical setting.  Hypodensity right pons may be related to streak artifact limiting detection for presence of small acute infarct.  Bilateral mastoid air cell opacification  Images reviewed with Dr. Nicole Kindred  on 05/16/2013  at 7:37 PM.   Electronically Signed   By: Chauncey Cruel M.D.   On: 05/16/2013 19:45   Mr Jodene Nam Head Wo Contrast  05/16/2013   CLINICAL DATA:  Slurred speech.  Blurred vision.  EXAM: MRI HEAD WITHOUT CONTRAST  MRA HEAD WITHOUT CONTRAST  TECHNIQUE: Multiplanar, multiecho pulse sequences of the brain and surrounding structures were obtained without intravenous contrast. Angiographic images of the head were obtained using MRA technique without contrast.  COMPARISON:  05/16/2013 head CT.  No comparison brain MR.  FINDINGS: MRI HEAD FINDINGS  Some of the sequences are motion degraded.  Acute small to slightly moderate size  infarct lateral to the left thalamus, posterior aspect of the posterior limb of the left internal capsule, posterior to the left lenticular nucleus and adjacent to the posterior margin of the left operculum.  T2 shine through from remote infarct mid right coronal radiata.  No intracranial hemorrhage.  Mild slightly moderate small vessel disease type changes.  Mild atrophy without hydrocephalus.  No intracranial mass lesion noted on this unenhanced exam.  Bilateral mastoid air cell middle ear opacification without obstructing lesion seen in the region of the posterior superior nasopharynx causing eustachian tube dysfunction. Minimal to mild paranasal sinus mucosal thickening.  Cervical medullary junction, pituitary region, pineal region and orbital structures unremarkable.  MRA HEAD FINDINGS  Exam is motion degraded.  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 Partial reconstitution of flow distal aspect of the left vertebral artery. Etiology indeterminate. Considerations include atherosclerotic changes versus  dissection.  No significant stenosis of the distal vertebral artery.  No significant stenosis of the basilar artery.  Mild irregularity of portions of the superior cerebral arteries and posterior cerebral arteries. No high-grade stenosis.  Mild narrowing, irregularity and areas of ectasia involving the cavernous segment and supraclinoid segment of the internal carotid arteries bilaterally.  Mild narrowing distal M1 segment right middle cerebral artery extending into the bifurcation.  Moderate to marked narrowing proximal left middle cerebral artery branch vessels with mild narrowing of middle cerebral artery branch vessels bilaterally.  Mild to moderate narrowing A2 segment left anterior cerebral artery.  No aneurysm noted.  IMPRESSION: MRI HEAD:  Exam is motion degraded  Acute small to slightly moderate size infarct lateral to the left thalamus, posterior aspect of the posterior limb of the left internal capsule, posterior to the left lenticular nucleus and adjacent to the posterior margin of the left operculum.  Remote infarct mid right coronal radiata.  No intracranial hemorrhage.  Mild to slightly moderate small vessel disease type changes.  Mild atrophy.  Bilateral mastoid air cell middle ear opacification. Minimal to mild paranasal sinus mucosal thickening.  MRA HEAD:  Exam is motion degraded.  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 Partial reconstitution of flow distal aspect of the left vertebral artery. Etiology indeterminate. Considerations include atherosclerotic changes versus dissection.  Intracranial atherosclerotic type changes otherwise as noted above  Results discussed with the Dr. Nicole Kindred at the time of imaging.   Electronically Signed   By: Chauncey Cruel M.D.   On: 05/16/2013 21:43   Mr Brain Wo Contrast  05/16/2013   CLINICAL DATA:  Slurred speech.  Blurred vision.  EXAM: MRI HEAD WITHOUT CONTRAST  MRA HEAD WITHOUT CONTRAST  TECHNIQUE: Multiplanar,  multiecho pulse sequences of the brain and surrounding structures were obtained without intravenous contrast. Angiographic images of the head were obtained using MRA technique without contrast.  COMPARISON:  05/16/2013 head CT.  No comparison brain MR.  FINDINGS: MRI HEAD FINDINGS  Some of the sequences are motion degraded.  Acute small to slightly moderate size infarct lateral to the left thalamus, posterior aspect of the posterior limb of the left internal capsule, posterior to the left lenticular nucleus and adjacent to the posterior margin of the left operculum.  T2 shine through from remote infarct mid right coronal radiata.  No intracranial hemorrhage.  Mild slightly moderate small vessel disease type changes.  Mild atrophy without hydrocephalus.  No intracranial mass lesion noted on this unenhanced exam.  Bilateral mastoid air cell middle ear opacification without obstructing lesion seen  in the region of the posterior superior nasopharynx causing eustachian tube dysfunction. Minimal to mild paranasal sinus mucosal thickening.  Cervical medullary junction, pituitary region, pineal region and orbital structures unremarkable.  MRA HEAD FINDINGS  Exam is motion degraded.  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 Partial reconstitution of flow distal aspect of the left vertebral artery. Etiology indeterminate. Considerations include atherosclerotic changes versus dissection.  No significant stenosis of the distal vertebral artery.  No significant stenosis of the basilar artery.  Mild irregularity of portions of the superior cerebral arteries and posterior cerebral arteries. No high-grade stenosis.  Mild narrowing, irregularity and areas of ectasia involving the cavernous segment and supraclinoid segment of the internal carotid arteries bilaterally.  Mild narrowing distal M1 segment right middle cerebral artery extending into the bifurcation.  Moderate to marked narrowing  proximal left middle cerebral artery branch vessels with mild narrowing of middle cerebral artery branch vessels bilaterally.  Mild to moderate narrowing A2 segment left anterior cerebral artery.  No aneurysm noted.  IMPRESSION: MRI HEAD:  Exam is motion degraded  Acute small to slightly moderate size infarct lateral to the left thalamus, posterior aspect of the posterior limb of the left internal capsule, posterior to the left lenticular nucleus and adjacent to the posterior margin of the left operculum.  Remote infarct mid right coronal radiata.  No intracranial hemorrhage.  Mild to slightly moderate small vessel disease type changes.  Mild atrophy.  Bilateral mastoid air cell middle ear opacification. Minimal to mild paranasal sinus mucosal thickening.  MRA HEAD:  Exam is motion degraded.  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 Partial reconstitution of flow distal aspect of the left vertebral artery. Etiology indeterminate. Considerations include atherosclerotic changes versus dissection.  Intracranial atherosclerotic type changes otherwise as noted above  Results discussed with the Dr. Nicole Kindred at the time of imaging.   Electronically Signed   By: Chauncey Cruel M.D.   On: 05/16/2013 21:43    Oren Binet, MD  [facility] Pager:[phone]  If 7PM-7AM, please contact night-coverage [email or web address] Password [email or web address] 05/17/2013, 2:08 PM   LOS: 1 day

## 2013-05-17 NOTE — Care Management Note (Signed)
    Page 1 of 2   05/19/2013     12:03:30 PM   CARE MANAGEMENT NOTE 05/19/2013  Patient:  Luke Kaufman, Luke Kaufman   Account Number:  [id number]  Date Initiated:  05/17/2013  Documentation initiated by:  GRAVES-BIGELOW,Edie Darley  Subjective/Objective Assessment:   Pt admitted for Acute onset of diplopia and vertigo.     Action/Plan:   Per PT recommendations were for CIR. CM will continue to monitor for disposition needs.   Anticipated DC Date:  05/19/2013   Anticipated DC Plan:  IP REHAB FACILITY      DC Planning Services  CM consult      [facility] Choice  HOME HEALTH   Choice offered to / List presented to:  C-1 Patient        [place] arranged  HH-1 RN  HH-10 DISEASE MANAGEMENT  HH-2 PT  HH-3 OT  [place].   Status of service:  Completed, signed off Medicare Important Message given?   (If response is "NO", the following Medicare IM given date fields will be blank) Date Medicare IM given:   Date Additional Medicare IM given:    Discharge Disposition:  [place]  Per UR Regulation:  Reviewed for med. necessity/level of care/duration of stay  If discussed at [place] of Stay Meetings, dates discussed:    Comments:  05-19-13 1201 Jacqlyn Krauss, RN,BSN [phone] CM did speak to pt and offered choice for [place]. Pt chose [facility] and CM did make referral. SOC to begin within 24-48 hrs post d/c. No further needs from CM at this time.

## 2013-05-17 NOTE — Progress Notes (Signed)
UR Completed Manases [place] Graves-Bigelow, RN,BSN [phone]  

## 2013-05-17 NOTE — Progress Notes (Signed)
Stroke Team Progress Note  HISTORY Luke Kaufman is an 63 y.o. male history of hypertension who experienced acute onset of diplopia and vertigo while driving at about [id number] PM this afternoon 05/16/2013. He has no previous history of stroke nor TIA. He has not been on antiplatelet therapy. He also has slurred speech which cleared after arriving in the emergency room. No focal weakness nor focal numbness was noted. CT scan of his head showed probable old right lenticular nucleus/corona radiata small infarct. Slight hyperdensity of the basilar tip and proximal cerebral arteries was noted. Thrombus cannot be excluded. There was also an area of possible hypodensity involving the right pons likely artifact. NIH stroke score was 0. Patient was not deemed a candidate for TPA because of minimal residual symptoms. MRI study showed an acute small to slightly moderate size infarct lateral to the left thalamus, posterior aspect of the posterior limb of the left internal capsule, posterior to the left lenticular nucleus and adjacent to the posterior margin of the left operculum. 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 Etiology considerations included atherosclerotic changes as well as possible dissection. Patient was not administerd TPA secondary to Minimal residual deficits with 0 NIH score . He was admitted for further evaluation and treatment.  He was placed on IV heparin over night due to MRI/A findings.  SUBJECTIVE No family is at the bedside.  Overall he feels his condition is stable, without significant change. He reports ongoing diplopia. Complains of headache.  OBJECTIVE Most recent Vital Signs: Filed Vitals:   05/17/13 0000 05/17/13 0200 05/17/13 0400 05/17/13 0606  BP: 139/68 155/77 128/51 144/83  Pulse: 73 72 65 68  Temp: 98.4 F (36.9 C) 97.5 F (36.4 C) 98.1 F (36.7 C) 98 F (36.7 C)  TempSrc:  Oral  Oral  Resp: 20 18 20 18    Height:      Weight:      SpO2: 93% 99% 92% 97%   CBG (last 3)   Recent Labs  05/16/13 1926  GLUCAP 113*    IV Fluid Intake:   . heparin 900 Units/hr (05/16/13 2338)    MEDICATIONS  . aspirin  325 mg Oral Daily  . influenza vac split quadrivalent PF  0.5 mL Intramuscular Tomorrow-1000   PRN:  acetaminophen  Diet:  NPO  Activity:  OOB with assistance DVT Prophylaxis:  IV heparin  CLINICALLY SIGNIFICANT STUDIES Basic Metabolic Panel:  Recent Labs Lab 05/16/13 1905 05/16/13 1921  NA 137 138  K 4.2 4.0  CL 99 102  CO2 22  --   GLUCOSE 120* 118*  BUN 21 22  CREATININE 1.42* 1.50*  CALCIUM 9.4  --    Liver Function Tests:  Recent Labs Lab 05/16/13 1905  AST 17  ALT 11  ALKPHOS 69  BILITOT <0.2*  PROT 7.3  ALBUMIN 3.6   CBC:  Recent Labs Lab 05/16/13 1905 05/16/13 1921  WBC 11.6*  --   NEUTROABS 9.3*  --   HGB 14.4 15.0  HCT 40.6 44.0  MCV 86.6  --   PLT 248  --    Coagulation:  Recent Labs Lab 05/16/13 1905  LABPROT 13.2  INR 1.02   Cardiac Enzymes:  Recent Labs Lab 05/16/13 1905  TROPONINI <0.30   Urinalysis:  Recent Labs Lab 05/17/13 0307  COLORURINE YELLOW  LABSPEC 1.016  PHURINE 5.5  GLUCOSEU NEGATIVE  HGBUR NEGATIVE  BILIRUBINUR NEGATIVE  KETONESUR NEGATIVE  PROTEINUR NEGATIVE  UROBILINOGEN 0.2  NITRITE NEGATIVE  LEUKOCYTESUR NEGATIVE   Lipid Panel    Component Value Date/Time   CHOL 170 05/17/2013 0415   TRIG 200* 05/17/2013 0415   HDL 25* 05/17/2013 0415   CHOLHDL 6.8 05/17/2013 0415   VLDL 40 05/17/2013 0415   LDLCALC 105* 05/17/2013 0415   HgbA1C  No results found for this basename: HGBA1C    Urine Drug Screen:     Component Value Date/Time   LABOPIA NONE DETECTED 05/17/2013 0307   COCAINSCRNUR NONE DETECTED 05/17/2013 0307   LABBENZ NONE DETECTED 05/17/2013 0307   AMPHETMU NONE DETECTED 05/17/2013 0307   THCU NONE DETECTED 05/17/2013 0307   LABBARB NONE DETECTED 05/17/2013 0307    Alcohol Level:  Recent  Labs Lab 05/16/13 1905  ETH <11    CT of the brain  05/16/2013    No intracranial hemorrhage.  Superior right lenticular nucleus/ corona radiata small infarct. This appears remote. Acute infarct less likely consideration although not entirely excluded.  Slightly dense appearance of the basilar tip/ proximal posterior cerebral arteries. This may be normal. Thrombus not excluded in the proper clinical setting.  Hypodensity right pons may be related to streak artifact limiting detection for presence of small acute infarct.  Bilateral mastoid air cell opacification    MRI of the brain  05/16/2013   Exam is motion degraded  Acute small to slightly moderate size infarct lateral to the left thalamus, posterior aspect of the posterior limb of the left internal capsule, posterior to the left lenticular nucleus and adjacent to the posterior margin of the left operculum.  Remote infarct mid right coronal radiata.  No intracranial hemorrhage.  Mild to slightly moderate small vessel disease type changes.  Mild atrophy.  Bilateral mastoid air cell middle ear opacification. Minimal to mild paranasal sinus mucosal thickening.    MRA of the brain  05/16/2013  Exam is motion degraded.  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 Partial reconstitution of flow distal aspect of the left vertebral artery. Etiology indeterminate. Considerations include atherosclerotic changes versus dissection.  Intracranial atherosclerotic type changes   2D Echocardiogram    Carotid Doppler    CXR    EKG  normal sinus rhythm. For complete results please see formal report.   Therapy Recommendations   Physical Exam   Awake alert. Afebrile. Head is nontraumatic. Neck is supple without bruit. Hearing is normal. Cardiac exam no murmur or gallop. Lungs are clear to auscultation. Distal pulses are well felt. Neurological Exam :  ;  Awake  Alert oriented x 3. Normal speech and language.eye movements  full without nystagmus.fundi were not visualized. Vision acuity  appear normal. Visual fields show partial right homonymous hemianopsia to bedside confrontational testing. Has subjective vertigo diplopia when looking straight tendon vertical plane.Hearing is normal. Palatal movements are normal. Face symmetric. Tongue midline. Normal strength, tone, reflexes and coordination. Normal sensation. Gait deferred. ASSESSMENT [name] is a 63 y.o. male presenting with acute onset of diplopia and vertigo. 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 Suspect current stroke is brainstem and not seen on MRI given presenting and ongoing symptoms of diplopia. 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  On no antithrombotics prior to admission. Now on aspirin 325 mg orally every day and heparin full dose,  no bolus, for secondary stroke prevention. Patient with resultant diplopia, right field cut. Work up underway.  Headache, addressed with tylenol hypertension Hyperlipidemia, LDL 105, on no statin PTA, now on no statin, goal LDL < 100  Cigarette smoker, 2 PPD Renal insufficiency Cr 1.5 Obesity, Body mass index is 37.77 kg/(m^2).   Hospital day # 1  TREATMENT/PLAN  Discontinue IV heparin. Discontinue full dose aspirin.  Add aspirin 81 mg orally every day and clopidogrel 75 mg orally every day for secondary stroke prevention.  Hydrate. If creatinine improves, will check CT angio head and neck in the am to confirm stenoses  Add statin  Stop smoking, nicotine patch  OOB, therapy evals  F/u 2D, carotid doppler  Burnetta Sabin, MSN, RN, ANVP-BC, ANP-BC, GNP-BC [facility] Pager: [phone] 05/17/2013 8:15 AM  I have personally obtained a history, examined the  patient, evaluated imaging results, and formulated the assessment and plan of care. I agree with the above. Antony Contras, MD

## 2013-05-17 NOTE — Evaluation (Signed)
Physical Therapy Evaluation Patient Details Name: Luke Kaufman MRN: [id number] DOB: 1950/09/24 Today's Date: 05/17/2013 Time: [id number] PT Time Calculation (min): 24 min  PT Assessment / Plan / Recommendation History of Present Illness  63 y.o. male admitted to [facility] on 05/16/13 with acute onset of diplopia and vertigo while driving at about [id number] pm this afternoon.  No history of stroke nor TIA that he knows about (old stroke demonstrated on imaging).  Also had slurred speech which cleared after arrival to the ED.  CT head shows and MRI confirms an old R lenticular nucleus / corona radiata small infarct.  MRI demonstrates a new infarct lateral to L thalamus.    Clinical Impression  Pt is very limited in his mobility by his vision.  He is quite off balance when he is on his feet, staggering to the right and requiring bil upper extremity support to help with balance during dynamic activities.  He lives alone and will need to mod I to return to independent living environment.  He also seemed to have some higher level cognition and language deficits.  I asked attending MD for SLP evaluation order.  This man would be an excellent inpatient rehab candidate.  PT to follow acutely for deficits listed below.       PT Assessment  Patient needs continued PT services    Follow Up Recommendations  CIR    Does the patient have the potential to tolerate intense rehabilitation     NA  Barriers to Discharge Decreased caregiver support He has a sister who lives in [place], but reports that no one can provide assist at d/c    Equipment Recommendations  Rolling walker with 5" wheels    Recommendations for Other Services Rehab consult;Speech consult   Frequency Min 4X/week    Precautions / Restrictions   Fall  Pertinent Vitals/Pain See vitals flow sheet.       Mobility  Bed Mobility Overal bed mobility: Needs Assistance Bed Mobility: Supine to Sit Supine to sit: Supervision General bed  mobility comments: supervision for safety Transfers Overall transfer level: Needs assistance Equipment used: None Transfers: Sit to/from Stand Sit to Stand: Min assist General transfer comment: min assist to steady pt at his trunk for balance.   Ambulation/Gait Ambulation/Gait assistance: Min assist;Mod assist Ambulation Distance (Feet): 120 Feet Assistive device: None (holding with bil hands to IV pole) Gait Pattern/deviations: Step-through pattern;Staggering right Gait velocity: decreased General Gait Details: Pt staggers to the right, attempted occluding one eye due to pt reports of double vision, but he continues to have deficits (likely more going on than just double vision-OT to assess).  Pt staggering and running into obstacles on his right side.  Mod assist at times to help support his trunk for balance and steer him around obstacles.   Modified Rankin (Stroke Patients Only) Pre-Morbid Rankin Score: No symptoms Modified Rankin: Moderately severe disability        PT Diagnosis: Difficulty walking;Abnormality of gait;Acute pain  PT Problem List: Decreased balance;Decreased mobility;Decreased knowledge of use of DME;Decreased cognition;Decreased safety awareness;Obesity PT Treatment Interventions: DME instruction;Gait training;Stair training;Therapeutic exercise;Functional mobility training;Therapeutic activities;Balance training;Neuromuscular re-education;Cognitive remediation;Patient/family education     PT Goals(Current goals can be found in the care plan section) Acute Rehab PT Goals Patient Stated Goal: to get better so he can go home and be independent again and see his dog.   PT Goal Formulation: With patient Time For Goal Achievement: 05/31/13 Potential to Achieve Goals: Good  Visit Information  Last PT Received On: 05/17/13 Assistance Needed: +1 History of Present Illness: 63 y.o. male admitted to [facility] on 05/16/13 with acute onset of diplopia and vertigo while driving  at about [id number] pm this afternoon.  No history of stroke nor TIA that he knows about (old stroke demonstrated on imaging).  Also had slurred speech which cleared after arrival to the ED.  CT head shows and MRI confirms an old R lenticular nucleus / corona radiata small infarct.  MRI demonstrates a new infarct lateral to L thalamus.         Prior [place] expects to be discharged to:: Private residence Living Arrangements: Alone Type of Home: Apartment Home Access: Stairs to enter [employer] of Steps: flight, landing, flight Entrance Stairs-Rails: Right;Left Home Layout: One level Home Equipment: None Prior Function Level of Independence: Independent Comments: retired, still driving.   Communication Communication: No difficulties Dominant Hand: Right    Cognition  Cognition Arousal/Alertness: Awake/alert Behavior During Therapy: WFL for tasks assessed/performed Overall Cognitive Status: Impaired/Different from baseline Area of Impairment: Problem solving;Memory Memory: Decreased short-term memory Problem Solving: Slow processing;Requires tactile cues;Requires verbal cues General Comments: PT seems to have eigher some slow processing or some retrieval issues.  He also seems to have some higher executive functioning issues.  Asked for SLP consult from MD to assess language and higher level cognition.      Extremity/Trunk Assessment Upper Extremity Assessment Upper Extremity Assessment: Defer to OT evaluation Lower Extremity Assessment Lower Extremity Assessment: Overall WFL for tasks assessed (strength 5/5 in bil legs, sensation/coordination intact.) Cervical / Trunk Assessment Cervical / Trunk Assessment: Normal   Balance Balance Overall balance assessment: Needs assistance Sitting-balance support: No upper extremity supported;Feet supported Sitting balance-Leahy Scale: Good Standing balance support: Bilateral upper extremity  supported Standing balance-Leahy Scale: Fair General Comments General comments (skin integrity, edema, etc.): Pt reports he wears glasses for seeing.  He reports to this therapist double vision (vertical images).    End of Session PT - End of Session Equipment Utilized During Treatment: Gait belt Activity Tolerance: Patient tolerated treatment well Patient left: in chair;with call bell/phone within reach;with chair alarm set Nurse Communication: Mobility status    Wells Guiles B. Woods, [place], DPT [phone]   05/17/2013, 10:42 AM

## 2013-05-17 NOTE — Progress Notes (Signed)
  Echocardiogram 2D Echocardiogram has been performed.  Luke Kaufman 05/17/2013, 8:55 AM

## 2013-05-17 NOTE — Progress Notes (Signed)
VASCULAR LAB PRELIMINARY  PRELIMINARY  PRELIMINARY  PRELIMINARY  Carotid duplex completed.    Preliminary report:  Bilateral: 1-39% ICA stenosis. Right vertebral artery flow is antegrade. Left vertebral artery flow is retrograde consistent with an occlusion.  Dixon Luczak, RVS 05/17/2013, 10:43 AM

## 2013-05-17 NOTE — Progress Notes (Signed)
Rehab Admissions Coordinator Note:  Patient was screened by Cleatrice Burke for appropriateness for an Inpatient Acute Rehab Consult.  At this time, we are recommending Inpatient Rehab consult.  Cleatrice Burke 05/17/2013, 6:10 PM  I can be reached at [phone].

## 2013-05-18 DIAGNOSIS — I6330 Cerebral infarction due to thrombosis of unspecified cerebral artery: Secondary | ICD-10-CM

## 2013-05-18 LAB — BASIC METABOLIC PANEL
BUN: 20 mg/dL (ref 6–23)
CALCIUM: 9.2 mg/dL (ref 8.4–10.5)
CO2: 23 meq/L (ref 19–32)
CREATININE: 1.42 mg/dL — AB (ref 0.50–1.35)
Chloride: 102 mEq/L (ref 96–112)
GFR calc non Af Amer: 51 mL/min — ABNORMAL LOW (ref >90)
GFR, EST AFRICAN AMERICAN: 60 mL/min — AB (ref >90)
Glucose, Bld: 111 mg/dL — ABNORMAL HIGH (ref 70–99)
Potassium: 4.3 mEq/L (ref 3.7–5.3)
SODIUM: 139 meq/L (ref 137–147)

## 2013-05-18 MED ORDER — GI COCKTAIL ~~LOC~~: 30.0000 mL | Freq: Once | ORAL | Status: DC

## 2013-05-18 MED ORDER — AMLODIPINE BESYLATE 10 MG PO TABS
10.0000 mg | ORAL_TABLET | Freq: Every day | ORAL | Status: DC
  Administered 2013-05-19: 10 mg via ORAL
  Filled 2013-05-18: qty 1

## 2013-05-18 NOTE — Evaluation (Signed)
Occupational Therapy Evaluation Patient Details Name: Luke Kaufman MRN: [id number] DOB: [birth year] Today's Date: 05/18/2013 Time: [id number] OT Time Calculation (min): 40 min  OT Assessment / Plan / Recommendation History of present illness 63 y.o. male admitted to [facility] on 05/16/13 with acute onset of diplopia and vertigo while driving at about [id number] pm this afternoon.  No history of stroke nor TIA that he knows about (old stroke demonstrated on imaging).  Also had slurred speech which cleared after arrival to the ED.  CT head shows and MRI confirms an old R lenticular nucleus / corona radiata small infarct.  MRI demonstrates a new infarct lateral to L thalamus.     Clinical Impression   This 63 yo male admitted and found to have above presents to acute OT with right eye right lower quadrant vision loss perhaps going into the left lower quadrant, also with decreased safety awareness (almost walks into items in hallway that are on his left), decreased ability to compensate for visual field deficits all affecting his ability to safely care for himself at home (especially for cooking tasks and driving). Would benefit from acute OT with follow up on CIR.    OT Assessment  Patient needs continued OT Services    Follow Up Recommendations  CIR    Barriers to Discharge Decreased caregiver support    Equipment Recommendations  None recommended by OT       Frequency  Min 2X/week    Precautions / Restrictions Precautions Precautions: Fall Precaution Comments: right eye right lower quadrant vision loss Restrictions Weight Bearing Restrictions: No       ADL  Eating/Feeding: Independent Where Assessed - Eating/Feeding: Chair Grooming: Supervision/safety (due to safety issues due to vision) Where Assessed - Grooming: Unsupported standing Upper Body Bathing: Supervision/safety (due to safety issues due to vision) Where Assessed - Upper Body Bathing: Unsupported sit to stand Lower Body  Bathing: Supervision/safety (due to safety issues due to vision) Where Assessed - Lower Body Bathing: Unsupported sit to stand Upper Body Dressing: Supervision/safety (due to safety issues due to vision) Where Assessed - Upper Body Dressing: Unsupported sit to stand Lower Body Dressing: Supervision/safety (due to safety issues due to vision) Where Assessed - Lower Body Dressing: Unsupported sit to stand Toilet Transfer: Min Psychiatric nurse Method: Sit to Loss adjuster, chartered:  (recliner>out and down hallway>to recliner) Toileting - Clothing Manipulation and Hygiene: Supervision/safety (due to safety issues due to vision) Where Assessed - Toileting Clothing Manipulation and Hygiene: Sit to stand from 3-in-1 or toilet Equipment Used: Gait belt Transfers/Ambulation Related to ADLs: min guard A for all without AD ADL Comments: Advised pt that he should not be driving.    OT Diagnosis: Disturbance of vision;Cognitive deficits  OT Problem List: Impaired balance (sitting and/or standing);Impaired vision/perception;Decreased cognition;Decreased safety awareness OT Treatment Interventions: Visual/perceptual remediation/compensation;Patient/family education;Balance training   OT Goals(Current goals can be found in the care plan section) Acute Rehab OT Goals Patient Stated Goal: I want to see my dog OT Goal Formulation: With patient Time For Goal Achievement: 05/25/13 Potential to Achieve Goals: Good  Visit Information  Last OT Received On: 05/18/13 Assistance Needed: +1 History of Present Illness: 63 y.o. male admitted to [facility] on 05/16/13 with acute onset of diplopia and vertigo while driving at about [id number] pm this afternoon.  No history of stroke nor TIA that he knows about (old stroke demonstrated on imaging).  Also had slurred speech which cleared after arrival to the ED.  CT head shows  and MRI confirms an old R lenticular nucleus / corona radiata small infarct.  MRI demonstrates  a new infarct lateral to L thalamus.         Prior [place] expects to be discharged to:: Private residence Living Arrangements: Alone Type of Home: Apartment Home Access: Stairs to enter [employer] of Steps: flight, landing, flight Entrance Stairs-Rails: Right;Left Home Layout: One level Home Equipment: None Prior Function Level of Independence: Independent Comments: retired, still driving.   Communication Communication: No difficulties Dominant Hand: Right         Vision/Perception Vision - History Baseline Vision: Wears glasses all the time Patient Visual Report:  (upon admisson pt reported double vision, now pt states his vision is all better) Vision - Assessment Eye Alignment: Within Functional Limits Vision Assessment: Vision tested Ocular Range of Motion: Within Functional Limits Alignment/Gaze Preference: Within Defined Limits Tracking/Visual Pursuits: Able to track stimulus in all quads without difficulty Saccades: Additional eye shifts occurred during testing Convergence: Within functional limits Visual Fields: Right visual field deficit (right eye, right lower quadrant)   Cognition  Cognition Arousal/Alertness: Awake/alert Behavior During Therapy: WFL for tasks assessed/performed Overall Cognitive Status: Impaired/Different from baseline Area of Impairment: Problem solving;Safety/judgement Safety/Judgement: Decreased awareness of deficits Problem Solving: Requires verbal cues (to find objects in right eyes right lower visual field) General Comments: Kept "checking" with me about what he was suppose to be looking for. He would say the correct item but could not necessarily find the item or take increased time to find the item    Extremity/Trunk Assessment Upper Extremity Assessment Upper Extremity Assessment: Overall WFL for tasks assessed              End of Session OT - End of Session Equipment  Utilized During Treatment: Gait belt Activity Tolerance: Patient tolerated treatment well Patient left: in chair;with call bell/phone within reach;with chair alarm set       Almon Register [id number] 05/18/2013, 12:15 PM

## 2013-05-18 NOTE — Progress Notes (Signed)
Stroke Team Progress Note  HISTORY Luke Kaufman is an 63 y.o. male history of hypertension who experienced acute onset of diplopia and vertigo while driving at about [id number] PM this afternoon 05/16/2013. He has no previous history of stroke nor TIA. He has not been on antiplatelet therapy. He also has slurred speech which cleared after arriving in the emergency room. No focal weakness nor focal numbness was noted. CT scan of his head showed probable old right lenticular nucleus/corona radiata small infarct. Slight hyperdensity of the basilar tip and proximal cerebral arteries was noted. Thrombus cannot be excluded. There was also an area of possible hypodensity involving the right pons likely artifact. NIH stroke score was 0. Patient was not deemed a candidate for TPA because of minimal residual symptoms. MRI study showed an acute small to slightly moderate size infarct lateral to the left thalamus, posterior aspect of the posterior limb of the left internal capsule, posterior to the left lenticular nucleus and adjacent to the posterior margin of the left operculum. 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 Etiology considerations included atherosclerotic changes as well as possible dissection. Patient was not administerd TPA secondary to Minimal residual deficits with 0 NIH score . He was admitted for further evaluation and treatment.  He was placed on IV heparin over night due to MRI/A findings.  SUBJECTIVE Pt sitting up in the chair at the bedside. His labs have not been drawn yet.  OBJECTIVE Most recent Vital Signs: Filed Vitals:   05/17/13 1957 05/18/13 0000 05/18/13 0435 05/18/13 0812  BP: 165/79 140/75 161/86 200/87  Pulse: 78 67 76 82  Temp: 98.2 F (36.8 C) 98.2 F (36.8 C) 98.3 F (36.8 C) 98.5 F (36.9 C)  TempSrc: Oral Oral Oral Oral  Resp: 18 20 18 17   Height:      Weight:      SpO2: 97% 92% 97% 97%   CBG (last  3)   Recent Labs  05/16/13 1926  GLUCAP 113*    IV Fluid Intake:   . sodium chloride 75 mL/hr at 05/18/13 0048    MEDICATIONS  . amLODipine  5 mg Oral Daily  . aspirin EC  81 mg Oral Daily  . clopidogrel  75 mg Oral Q breakfast  . enoxaparin (LOVENOX) injection  40 mg Subcutaneous Q24H  . nicotine  14 mg Transdermal Daily  . simvastatin  20 mg Oral q1800   PRN:  acetaminophen, morphine injection, ondansetron (ZOFRAN) IV, oxyCODONE  Diet:  Cardiac thin liquids Activity:  OOB with assistance DVT Prophylaxis:  Lovenox 40 mg sq daily   CLINICALLY SIGNIFICANT STUDIES Basic Metabolic Panel:   Recent Labs Lab 05/16/13 1905 05/16/13 1921  NA 137 138  K 4.2 4.0  CL 99 102  CO2 22  --   GLUCOSE 120* 118*  BUN 21 22  CREATININE 1.42* 1.50*  CALCIUM 9.4  --    Liver Function Tests:   Recent Labs Lab 05/16/13 1905  AST 17  ALT 11  ALKPHOS 69  BILITOT <0.2*  PROT 7.3  ALBUMIN 3.6   CBC:   Recent Labs Lab 05/16/13 1905 05/16/13 1921  WBC 11.6*  --   NEUTROABS 9.3*  --   HGB 14.4 15.0  HCT 40.6 44.0  MCV 86.6  --   PLT 248  --    Coagulation:   Recent Labs Lab 05/16/13 1905  LABPROT 13.2  INR 1.02   Cardiac Enzymes:  Recent Labs Lab 05/16/13 1905  TROPONINI <0.30   Urinalysis:   Recent Labs Lab 05/17/13 0307  COLORURINE YELLOW  LABSPEC 1.016  PHURINE 5.5  GLUCOSEU NEGATIVE  HGBUR NEGATIVE  BILIRUBINUR NEGATIVE  KETONESUR NEGATIVE  PROTEINUR NEGATIVE  UROBILINOGEN 0.2  NITRITE NEGATIVE  LEUKOCYTESUR NEGATIVE   Lipid Panel    Component Value Date/Time   CHOL 170 05/17/2013 0415   TRIG 200* 05/17/2013 0415   HDL 25* 05/17/2013 0415   CHOLHDL 6.8 05/17/2013 0415   VLDL 40 05/17/2013 0415   LDLCALC 105* 05/17/2013 0415   HgbA1C  Lab Results  Component Value Date   HGBA1C 5.7* 05/17/2013    Urine Drug Screen:     Component Value Date/Time   LABOPIA NONE DETECTED 05/17/2013 0307   COCAINSCRNUR NONE DETECTED 05/17/2013 0307    LABBENZ NONE DETECTED 05/17/2013 0307   AMPHETMU NONE DETECTED 05/17/2013 0307   THCU NONE DETECTED 05/17/2013 0307   LABBARB NONE DETECTED 05/17/2013 0307    Alcohol Level:   Recent Labs Lab 05/16/13 1905  ETH <11    CT of the brain  05/16/2013    No intracranial hemorrhage.  Superior right lenticular nucleus/ corona radiata small infarct. This appears remote. Acute infarct less likely consideration although not entirely excluded.  Slightly dense appearance of the basilar tip/ proximal posterior cerebral arteries. This may be normal. Thrombus not excluded in the proper clinical setting.  Hypodensity right pons may be related to streak artifact limiting detection for presence of small acute infarct.  Bilateral mastoid air cell opacification    MRI of the brain  05/16/2013   Exam is motion degraded  Acute small to slightly moderate size infarct lateral to the left thalamus, posterior aspect of the posterior limb of the left internal capsule, posterior to the left lenticular nucleus and adjacent to the posterior margin of the left operculum.  Remote infarct mid right coronal radiata.  No intracranial hemorrhage.  Mild to slightly moderate small vessel disease type changes.  Mild atrophy.  Bilateral mastoid air cell middle ear opacification. Minimal to mild paranasal sinus mucosal thickening.    MRA of the brain  05/16/2013  Exam is motion degraded.  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 Partial reconstitution of flow distal aspect of the left vertebral artery. Etiology indeterminate. Considerations include atherosclerotic changes versus dissection.  Intracranial atherosclerotic type changes   2D Echocardiogram  EF 60-65% with no source of embolus.   Carotid Doppler  Bilateral: 1-39% ICA stenosis. Right vertebral artery flow is antegrade. Left vertebral artery flow is retrograde consistent with an occlusion.  CXR    EKG  normal sinus rhythm. For complete  results please see formal report.   Therapy Recommendations CIR  Physical Exam   Awake alert. Afebrile. Head is nontraumatic. Neck is supple without bruit. Hearing is normal. Cardiac exam no murmur or gallop. Lungs are clear to auscultation. Distal pulses are well felt. Neurological Exam :  ;  Awake  Alert oriented x 3. Normal speech and language.eye movements full without nystagmus.fundi were not visualized. Vision acuity  appear normal. Visual fields appear full today  to bedside confrontational testing. Has subjective vertigo diplopia when looking straight  Down in  vertical plane.Hearing is normal. Palatal movements are normal. Face symmetric. Tongue midline. Normal strength, tone, reflexes and coordination. Normal sensation. Gait deferred.  ASSESSMENT [name] is a 63 y.o. male presenting with acute onset of diplopia and vertigo. 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 Suspect current stroke is brainstem and not seen on MRI given presenting and ongoing symptoms of diplopia. MRA shows LVA occlusion with retrograde flow; RVA dominant. Carotid shows LVA with retrograde flow. Pulse stronger on right than left, ? Subclavian steal - CTA would help confirm.  Current infarct felt to be thrombotic secondary to small vessel disease.  On no antithrombotics prior to admission. Now on aspirin 81 mg orally every day and clopidogrel 75 mg orally every day full dose, no bolus, for secondary stroke prevention. Patient with resultant diplopia, right field cut - his field cut is improving. Work up underway.  Headache, addressed with tylenol Hypertension, lisinopril on hold, amlodipine started Hyperlipidemia, LDL 105, on no statin PTA, now on zocor 20 mg daily, goal LDL < 100  Cigarette smoker, 2 PPD Renal insufficiency Cr 1.5->pending (labs not yet drawn, but is ordered), hydration over night Obesity, Body mass index is 37.77 kg/(m^2).    Hospital day # 2  TREATMENT/PLAN  Continue  aspirin 81 mg orally every day and clopidogrel 75 mg orally every day for secondary stroke prevention.  Hydrate. If creatinine improves, will check CT angio head and neck  to confirm stenoses - await Cr results from this am  Continue statin  Stop smoking, nicotine patch  Rehab consult in place  Check BP both arms and compare, ? Subclavian steal  Burnetta Sabin, MSN, RN, ANVP-BC, ANP-BC, GNP-BC [facility] Pager: [phone] 05/18/2013 9:29 AM  I have personally obtained a history, examined the patient, evaluated imaging results, and formulated the assessment and plan of care. I agree with the above.  Antony Contras, MD

## 2013-05-18 NOTE — Consult Note (Signed)
Physical Medicine and Rehabilitation Consult Reason for Consult: CVA Referring Physician: [facility]   HPI: Luke Kaufman is a 63 y.o. right-handed male with history of hypertension and tobacco abuse. Patient was independent prior to admission. Admitted 05/16/2013 with acute onset of diplopia and vertigo as well as associated speech upon arrival to the ED. MRI of the brain showed acute infarct left thalamus, posterior aspect of the posterior limb of the left internal capsule and left lenticular nuclear this as well as remote infarct mid right coronal radiata. MRA of the head showed left vertebral artery left posterior inferior cerebellar artery to be occluded. Patient did not receive TPA. Echocardiogram with ejection fraction of [id number] grade 1 diastolic dysfunction. Carotid Dopplers right vertebral artery flow is antegrade. Left vertebral artery flow is retrograde consistent with occlusion. Neurology services consulted with workup ongoing maintained on aspirin and Plavix therapy for CVA prophylaxis as well as subcutaneous Lovenox for DVT prophylaxis. A NicoDerm patch was placed for tobacco abuse. He is tolerating a regular diet. Physical therapy evaluation completed 05/17/2013 with recommendations of physical medicine rehabilitation consult to consider inpatient rehabilitation services.  Pt amb without device without Loss of balance with OT No nausea or vomiting  Review of Systems  Eyes: Positive for double vision.  Musculoskeletal: Positive for myalgias.  Neurological: Positive for dizziness.  All other systems reviewed and are negative.   Past Medical History  Diagnosis Date  . Hypertension   . Gastric ulcer    History reviewed. No pertinent past surgical history. No family history on file. Social History:  reports that he has been smoking.  He does not have any smokeless tobacco history on file. He reports that he drinks alcohol. He reports that he does not use illicit  drugs. Allergies: No Known Allergies Medications Prior to Admission  Medication Sig Dispense Refill  . ibuprofen (ADVIL,MOTRIN) 200 MG tablet Take 200-400 mg by mouth every 6 (six) hours as needed for headache.      . lisinopril (PRINIVIL,ZESTRIL) 10 MG tablet Take 5 mg by mouth daily.      . traMADol (ULTRAM) 50 MG tablet Take 50-100 mg by mouth every 6 (six) hours as needed (pain).        Home: Home Living Family/patient expects to be discharged to:: Private residence Living Arrangements: Alone Type of Home: Apartment Home Access: Stairs to enter [employer] of Steps: flight, landing, flight Entrance Stairs-Rails: Right;Left Home Layout: One level Home Equipment: None  Functional History: Prior Function Comments: retired, still driving.   Functional Status:  Mobility:         ADL:    Cognition: Cognition Overall Cognitive Status: Impaired/Different from baseline Orientation Level: Oriented X4 Cognition Arousal/Alertness: Awake/alert Behavior During Therapy: WFL for tasks assessed/performed Overall Cognitive Status: Impaired/Different from baseline Area of Impairment: Problem solving;Memory Memory: Decreased short-term memory Problem Solving: Slow processing;Requires tactile cues;Requires verbal cues General Comments: PT seems to have eigher some slow processing or some retrieval issues.  He also seems to have some higher executive functioning issues.  Asked for SLP consult from MD to assess language and higher level cognition.    Blood pressure 161/86, pulse 76, temperature 98.3 F (36.8 C), temperature source Oral, resp. rate 18, height 5\' 7"  (1.702 m), weight 109.408 kg (241 lb 3.2 oz), SpO2 97.00%. Physical Exam  Vitals reviewed. Constitutional: He is oriented to person, place, and time. He appears well-developed.  HENT:  Head: Normocephalic.  Eyes: EOM are normal.  No nystagmus  Neck: Normal range of motion. Neck supple. No thyromegaly present.   Cardiovascular: Normal rate and regular rhythm.   Respiratory: Effort normal and breath sounds normal. No respiratory distress.  GI: Soft. Bowel sounds are normal. He exhibits no distension.  Neurological: He is alert and oriented to person, place, and time.  Patient follows full commands. He does demonstrate some decreased fine motor skills  Skin: Skin is warm and dry.  Psychiatric: He has a normal mood and affect.   sit to stand modified independent, standing balance is good, ambulates with supervision no loss of balance no assistive device Mild diplopia on right lateral gaze Past pointing on right finger-nose-finger 5/5 strength bilateral deltoid, bicep, tricep, grip, hip flexor, knee extensors, ankle dorsiflexor plantar flexor Sensation intact to light touch in both upper and lower limbs as well as facial area. Cranial nerves II through XII intact with the exception of right cranial nerves 6  No results found for this or any previous visit (from the past 24 hour(s)). Ct Head Wo Contrast  05/16/2013   CLINICAL DATA:  Diplopia.  Slurred speech.  Hypertensive patient.  EXAM: CT HEAD WITHOUT CONTRAST  TECHNIQUE: Contiguous axial images were obtained from the base of the skull through the vertex without intravenous contrast.  COMPARISON:  None.  FINDINGS: No intracranial hemorrhage.  Superior right lenticular nucleus/ corona radiata small infarct. This appears remote. Acute infarct less likely consideration although not entirely excluded.  Slightly dense appearance of the basilar tip/ proximal posterior cerebral arteries. This may be normal. Thrombus not excluded in the proper clinical setting.  Hypodensity right pons may be related to streak artifact limiting detection for presence of small acute infarct.  No hydrocephalus.  No intracranial mass lesion noted on this unenhanced exam.  Vascular calcifications.  Bilateral mastoid air cell opacification without destruction of the septi. No obvious mass  posterior superior nasopharynx detected as cause of eustachian tube dysfunction. This is incompletely assessed on present exam.  IMPRESSION: No intracranial hemorrhage.  Superior right lenticular nucleus/ corona radiata small infarct. This appears remote. Acute infarct less likely consideration although not entirely excluded.  Slightly dense appearance of the basilar tip/ proximal posterior cerebral arteries. This may be normal. Thrombus not excluded in the proper clinical setting.  Hypodensity right pons may be related to streak artifact limiting detection for presence of small acute infarct.  Bilateral mastoid air cell opacification  Images reviewed with Dr. Nicole Kindred  on 05/16/2013  at 7:37 PM.   Electronically Signed   By: Chauncey Cruel M.D.   On: 05/16/2013 19:45   Mr Jodene Nam Head Wo Contrast  05/16/2013   CLINICAL DATA:  Slurred speech.  Blurred vision.  EXAM: MRI HEAD WITHOUT CONTRAST  MRA HEAD WITHOUT CONTRAST  TECHNIQUE: Multiplanar, multiecho pulse sequences of the brain and surrounding structures were obtained without intravenous contrast. Angiographic images of the head were obtained using MRA technique without contrast.  COMPARISON:  05/16/2013 head CT.  No comparison brain MR.  FINDINGS: MRI HEAD FINDINGS  Some of the sequences are motion degraded.  Acute small to slightly moderate size infarct lateral to the left thalamus, posterior aspect of the posterior limb of the left internal capsule, posterior to the left lenticular nucleus and adjacent to the posterior margin of the left operculum.  T2 shine through from remote infarct mid right coronal radiata.  No intracranial hemorrhage.  Mild slightly moderate small vessel disease type changes.  Mild atrophy without hydrocephalus.  No intracranial mass lesion noted on this  unenhanced exam.  Bilateral mastoid air cell middle ear opacification without obstructing lesion seen in the region of the posterior superior nasopharynx causing eustachian tube dysfunction.  Minimal to mild paranasal sinus mucosal thickening.  Cervical medullary junction, pituitary region, pineal region and orbital structures unremarkable.  MRA HEAD FINDINGS  Exam is motion degraded.  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 Partial reconstitution of flow distal aspect of the left vertebral artery. Etiology indeterminate. Considerations include atherosclerotic changes versus dissection.  No significant stenosis of the distal vertebral artery.  No significant stenosis of the basilar artery.  Mild irregularity of portions of the superior cerebral arteries and posterior cerebral arteries. No high-grade stenosis.  Mild narrowing, irregularity and areas of ectasia involving the cavernous segment and supraclinoid segment of the internal carotid arteries bilaterally.  Mild narrowing distal M1 segment right middle cerebral artery extending into the bifurcation.  Moderate to marked narrowing proximal left middle cerebral artery branch vessels with mild narrowing of middle cerebral artery branch vessels bilaterally.  Mild to moderate narrowing A2 segment left anterior cerebral artery.  No aneurysm noted.  IMPRESSION: MRI HEAD:  Exam is motion degraded  Acute small to slightly moderate size infarct lateral to the left thalamus, posterior aspect of the posterior limb of the left internal capsule, posterior to the left lenticular nucleus and adjacent to the posterior margin of the left operculum.  Remote infarct mid right coronal radiata.  No intracranial hemorrhage.  Mild to slightly moderate small vessel disease type changes.  Mild atrophy.  Bilateral mastoid air cell middle ear opacification. Minimal to mild paranasal sinus mucosal thickening.  MRA HEAD:  Exam is motion degraded.  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 Partial reconstitution of flow distal aspect of the left vertebral artery. Etiology indeterminate. Considerations  include atherosclerotic changes versus dissection.  Intracranial atherosclerotic type changes otherwise as noted above  Results discussed with the Dr. Nicole Kindred at the time of imaging.   Electronically Signed   By: Chauncey Cruel M.D.   On: 05/16/2013 21:43   Mr Brain Wo Contrast  05/16/2013   CLINICAL DATA:  Slurred speech.  Blurred vision.  EXAM: MRI HEAD WITHOUT CONTRAST  MRA HEAD WITHOUT CONTRAST  TECHNIQUE: Multiplanar, multiecho pulse sequences of the brain and surrounding structures were obtained without intravenous contrast. Angiographic images of the head were obtained using MRA technique without contrast.  COMPARISON:  05/16/2013 head CT.  No comparison brain MR.  FINDINGS: MRI HEAD FINDINGS  Some of the sequences are motion degraded.  Acute small to slightly moderate size infarct lateral to the left thalamus, posterior aspect of the posterior limb of the left internal capsule, posterior to the left lenticular nucleus and adjacent to the posterior margin of the left operculum.  T2 shine through from remote infarct mid right coronal radiata.  No intracranial hemorrhage.  Mild slightly moderate small vessel disease type changes.  Mild atrophy without hydrocephalus.  No intracranial mass lesion noted on this unenhanced exam.  Bilateral mastoid air cell middle ear opacification without obstructing lesion seen in the region of the posterior superior nasopharynx causing eustachian tube dysfunction. Minimal to mild paranasal sinus mucosal thickening.  Cervical medullary junction, pituitary region, pineal region and orbital structures unremarkable.  MRA HEAD FINDINGS  Exam is motion degraded.  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 Partial reconstitution of flow distal aspect of the left vertebral artery. Etiology indeterminate. Considerations include atherosclerotic changes versus  dissection.  No significant stenosis of the distal vertebral artery.  No significant  stenosis of the basilar artery.  Mild irregularity of portions of the superior cerebral arteries and posterior cerebral arteries. No high-grade stenosis.  Mild narrowing, irregularity and areas of ectasia involving the cavernous segment and supraclinoid segment of the internal carotid arteries bilaterally.  Mild narrowing distal M1 segment right middle cerebral artery extending into the bifurcation.  Moderate to marked narrowing proximal left middle cerebral artery branch vessels with mild narrowing of middle cerebral artery branch vessels bilaterally.  Mild to moderate narrowing A2 segment left anterior cerebral artery.  No aneurysm noted.  IMPRESSION: MRI HEAD:  Exam is motion degraded  Acute small to slightly moderate size infarct lateral to the left thalamus, posterior aspect of the posterior limb of the left internal capsule, posterior to the left lenticular nucleus and adjacent to the posterior margin of the left operculum.  Remote infarct mid right coronal radiata.  No intracranial hemorrhage.  Mild to slightly moderate small vessel disease type changes.  Mild atrophy.  Bilateral mastoid air cell middle ear opacification. Minimal to mild paranasal sinus mucosal thickening.  MRA HEAD:  Exam is motion degraded.  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 Partial reconstitution of flow distal aspect of the left vertebral artery. Etiology indeterminate. Considerations include atherosclerotic changes versus dissection.  Intracranial atherosclerotic type changes otherwise as noted above  Results discussed with the Dr. Nicole Kindred at the time of imaging.   Electronically Signed   By: Chauncey Cruel M.D.   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 Does the need for close, 24 hr/day medical supervision in concert with the patient's rehab needs make  it unreasonable for this patient to be served in a less intensive setting? No 2. Co-Morbidities requiring supervision/potential complications: Hypertension 3. Due to safety, disease management and medication administration, does the patient require 24 hr/day rehab nursing? No 4. Does the patient require coordinated care of a physician, rehab nurse, SLP (Not applicable hrs/day, NA days/week) to address physical and functional deficits in the context of the above medical diagnosis(es)? No Addressing deficits in the following areas: balance 5. Can the patient actively participate in an intensive therapy program of at least 3 hrs of therapy per day at least 5 days per week? Potentially 6. The potential for patient to make measurable gains while on inpatient rehab is NA 7. Anticipated functional outcomes upon discharge from inpatient rehab are N/A with PT, NA with OT, NA with SLP. 8. Estimated rehab length of stay to reach the above functional goals is: NA 9. Does the patient have adequate social supports to accommodate these discharge functional goals? Potentially 10. Anticipated D/C setting: Home 11. Anticipated post D/C treatments: Outpt therapy 12. Overall Rehab/Functional Prognosis: excellent  RECOMMENDATIONS: This patient's condition is appropriate for continued rehabilitative care in the following setting: Outpt Patient has agreed to participate in recommended program. Yes Note that insurance prior authorization may be required for reimbursement for recommended care.  Comment: Too high level for inpatient rehabilitation, if unable to have transportation outpatient, recommend [facility]  balance training program    05/18/2013

## 2013-05-18 NOTE — Evaluation (Signed)
Speech Language Pathology Evaluation Patient Details Name: Luke Kaufman MRN: [id number] DOB: 08-20-1950 Today's Date: 05/18/2013 Time: [id number] SLP Time Calculation (min): 29 min  Problem List:  Patient Active Problem List   Diagnosis Date Noted  . CVA (cerebral infarction) 05/16/2013  . HTN (hypertension) 05/16/2013  . 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 Hypertension   . Gastric ulcer    Past Surgical History: History reviewed. No pertinent past surgical history. HPI:  63 y.o. male admitted to [facility] on 05/16/13 with acute onset of diplopia and vertigo while driving at about [id number] pm this afternoon.  No history of stroke nor TIA that he knows about (old stroke demonstrated on imaging).  Also had slurred speech which cleared after arrival to the ED.  CT head shows and MRI confirms an old R lenticular nucleus / corona radiata small infarct.  MRI demonstrates a new infarct lateral to L thalamus, posterior aspect of the posterior limb of the left internal capsule, posterior to the left lenticular nucleus and adjacent to the posterior margin of the left operculum. .       Assessment / Plan / Recommendation Clinical Impression  Pt presents with acute changes in working and short-term memory s/p subcortical CVA.  Language and speech are intact.  Presents with mild delays in processing time, difficulty recalling verbal information, reduced divergent recall, and impaired anticipatory awareness.  Pt would benefit from SLP intervention to address higher-level cognitive deficits.  Pt agrees; verbalizes concerns about his vision, ability to drive s/p D/C.  Agree with CIR consult.     SLP Assessment  Patient needs continued Speech Lanaguage Pathology Services    Follow Up Recommendations  Inpatient Rehab    Frequency and Duration min 2x/week  2 weeks   Pertinent Vitals/Pain No c/o pain   SLP Goals  SLP Goals Potential to  Achieve Goals: Good Progress/Goals/Alternative treatment plan discussed with pt/caregiver and they: Agree  SLP Evaluation Prior Functioning  Cognitive/Linguistic Baseline: Within functional limits Type of Home: Apartment (second story)  Lives With: Alone Available Help at Discharge: Friend(s) Vocation: Retired   Associate Professor  Overall Cognitive Status: Impaired/Different from baseline Arousal/Alertness: Awake/alert Orientation Level: Oriented X4 Attention: Selective Selective Attention: Appears intact Memory: Impaired Memory Impairment: Storage deficit;Retrieval deficit Awareness: Impaired Awareness Impairment: Anticipatory impairment Problem Solving: Impaired Problem Solving Impairment: Verbal complex Safety/Judgment: Impaired    Comprehension  Auditory Comprehension Overall Auditory Comprehension: Appears within functional limits for tasks assessed Visual Recognition/Discrimination Discrimination: Within Function Limits    Expression Expression Primary Mode of Expression: Verbal Verbal Expression Overall Verbal Expression: Appears within functional limits for tasks assessed Written Expression Dominant Hand: Right Written Expression: Within Functional Limits   Oral / Motor Oral Motor/Sensory Function Overall Oral Motor/Sensory Function: Appears within functional limits for tasks assessed Motor Speech Overall Motor Speech: Appears within functional limits for tasks assessed   Luke Kaufman, [place] CCC/SLP Pager [phone]      Luke Kaufman 05/18/2013, 2:07 PM

## 2013-05-18 NOTE — Progress Notes (Signed)
PATIENT DETAILS Name: Luke Kaufman Age: 63 y.o. Sex: male Date of Birth: August 15, 1950 Admit Date: 05/16/2013 Admitting Physician Etta Quill, DO PCP:No PCP Per Patient  Subjective: Much better this morning, headache and diplopia have resolved.  Assessment/Plan: Principal Problem:   Acute CVA (cerebral infarction) - Admitted with vertigo and diplopia, although MRI brain showed acute infarct in the left thalamus/internal capsule area, it does not explain patient's presenting symptoms. Current suspicion is for a small brainstem CVA that is not seen on the MRI. On admission, because of concern for  dissection/active thrombus in the left vertebral artery, was started on heparin by neurology. However after evaluation by the stroke team, this has been discontinued. Now on aspirin and Plavix. Continue statins - Bilateral carotid Doppler shows-1/29% ICA stenosis bilaterally. - 2-D echocardiogram shows preserved ejection fraction around 60-65% with grade 1 diastolic dysfunction -[id number] -A1C-5.7 -Await  CTA Brain-once creatinine improves further after IVF hydration  HTN -Hold lisinopril given mild renal insufficiency, started amlodipine- daily with moderate control, hence increased amlodipine to 10 mg  Acute renal failure - Hydrate, recheck creatinine in a.m. UA shows no proteinuria  Tobacco abuse - Transdermal nicotine  Disposition: Remain inpatient- ? CIR in discharge  DVT Prophylaxis: Prophylactic Lovenox   Code Status: Full code   Family Communication None at bedside  Procedures:  None  CONSULTS:  neurology  Time spent 40 minutes-which includes 50% of the time with face-to-face with patient/ family and coordinating care related to the above assessment and plan.  MEDICATIONS: Scheduled Meds: . amLODipine  5 mg Oral Daily  . aspirin EC  81 mg Oral Daily  . clopidogrel  75 mg Oral Q breakfast  . enoxaparin (LOVENOX) injection  40 mg Subcutaneous Q24H  .  nicotine  14 mg Transdermal Daily  . simvastatin  20 mg Oral q1800   Continuous Infusions: . sodium chloride 75 mL/hr at 05/18/13 0048   PRN Meds:.acetaminophen, morphine injection, ondansetron (ZOFRAN) IV, oxyCODONE  Antibiotics: Anti-infectives   None       PHYSICAL EXAM: Vital signs in last 24 hours: Filed Vitals:   05/18/13 0812 05/18/13 0933 05/18/13 0935 05/18/13 1200  BP: 200/87 152/94 204/93 149/100  Pulse: 82 86  94  Temp: 98.5 F (36.9 C)   98.3 F (36.8 C)  TempSrc: Oral   Oral  Resp: 17     Height:      Weight:      SpO2: 97%   97%    Weight change:  Filed Weights   05/16/13 2237  Weight: 109.408 kg (241 lb 3.2 oz)   Body mass index is 37.77 kg/(m^2).   Gen Exam: Awake and alert with clear speech.   Neck: Supple, No JVD.   Chest: B/L Clear.   CVS: S1 S2 Regular, no murmurs.  Abdomen: soft, BS +, non tender, non distended.  Extremities: no edema, lower extremities warm to touch. Neurologic: Non Focal.  Skin: No Rash.   Wounds: N/A.    Intake/Output from previous day:  Intake/Output Summary (Last 24 hours) at 05/18/13 1436 Last data filed at 05/18/13 0800  Gross per 24 hour  Intake   1785 ml  Output      0 ml  Net   1785 ml     LAB RESULTS: CBC  Recent Labs Lab 05/16/13 1905 05/16/13 1921  WBC 11.6*  --   HGB 14.4 15.0  HCT 40.6 44.0  PLT 248  --  MCV 86.6  --   MCH 30.7  --   MCHC 35.5  --   RDW 12.9  --   LYMPHSABS 1.5  --   MONOABS 0.6  --   EOSABS 0.2  --   BASOSABS 0.0  --     Chemistries   Recent Labs Lab 05/16/13 1905 05/16/13 1921 05/18/13 0940  NA 137 138 139  K 4.2 4.0 4.3  CL 99 102 102  CO2 22  --  23  GLUCOSE 120* 118* 111*  BUN 21 22 20   CREATININE 1.42* 1.50* 1.42*  CALCIUM 9.4  --  9.2    CBG:  Recent Labs Lab 05/16/13 1926  GLUCAP 113*    GFR Estimated Creatinine Clearance: 63.6 ml/min (by C-G formula based on Cr of 1.42).  Coagulation profile  Recent Labs Lab 05/16/13 1905    INR 1.02    Cardiac Enzymes  Recent Labs Lab 05/16/13 1905  TROPONINI <0.30    No components found with this basename: POCBNP,  No results found for this basename: DDIMER,  in the last 72 hours  Recent Labs  05/17/13 0415  HGBA1C 5.7*    Recent Labs  05/17/13 0415  CHOL 170  HDL 25*  LDLCALC 105*  TRIG 200*  CHOLHDL 6.8   No results found for this basename: TSH, T4TOTAL, FREET3, T3FREE, THYROIDAB,  in the last 72 hours No results found for this basename: VITAMINB12, FOLATE, FERRITIN, TIBC, IRON, RETICCTPCT,  in the last 72 hours No results found for this basename: LIPASE, AMYLASE,  in the last 72 hours  Urine Studies No results found for this basename: UACOL, UAPR, USPG, UPH, UTP, UGL, UKET, UBIL, UHGB, UNIT, UROB, ULEU, UEPI, UWBC, URBC, UBAC, CAST, CRYS, UCOM, BILUA,  in the last 72 hours  MICROBIOLOGY: No results found for this or any previous visit (from the past 240 hour(s)).  RADIOLOGY STUDIES/RESULTS: Ct Head Wo Contrast  05/16/2013   CLINICAL DATA:  Diplopia.  Slurred speech.  Hypertensive patient.  EXAM: CT HEAD WITHOUT CONTRAST  TECHNIQUE: Contiguous axial images were obtained from the base of the skull through the vertex without intravenous contrast.  COMPARISON:  None.  FINDINGS: No intracranial hemorrhage.  Superior right lenticular nucleus/ corona radiata small infarct. This appears remote. Acute infarct less likely consideration although not entirely excluded.  Slightly dense appearance of the basilar tip/ proximal posterior cerebral arteries. This may be normal. Thrombus not excluded in the proper clinical setting.  Hypodensity right pons may be related to streak artifact limiting detection for presence of small acute infarct.  No hydrocephalus.  No intracranial mass lesion noted on this unenhanced exam.  Vascular calcifications.  Bilateral mastoid air cell opacification without destruction of the septi. No obvious mass posterior superior nasopharynx  detected as cause of eustachian tube dysfunction. This is incompletely assessed on present exam.  IMPRESSION: No intracranial hemorrhage.  Superior right lenticular nucleus/ corona radiata small infarct. This appears remote. Acute infarct less likely consideration although not entirely excluded.  Slightly dense appearance of the basilar tip/ proximal posterior cerebral arteries. This may be normal. Thrombus not excluded in the proper clinical setting.  Hypodensity right pons may be related to streak artifact limiting detection for presence of small acute infarct.  Bilateral mastoid air cell opacification  Images reviewed with Dr. Nicole Kindred  on 05/16/2013  at 7:37 PM.   Electronically Signed   By: Chauncey Cruel M.D.   On: 05/16/2013 19:45   Mr Jodene Nam Head Wo Contrast  05/16/2013  CLINICAL DATA:  Slurred speech.  Blurred vision.  EXAM: MRI HEAD WITHOUT CONTRAST  MRA HEAD WITHOUT CONTRAST  TECHNIQUE: Multiplanar, multiecho pulse sequences of the brain and surrounding structures were obtained without intravenous contrast. Angiographic images of the head were obtained using MRA technique without contrast.  COMPARISON:  05/16/2013 head CT.  No comparison brain MR.  FINDINGS: MRI HEAD FINDINGS  Some of the sequences are motion degraded.  Acute small to slightly moderate size infarct lateral to the left thalamus, posterior aspect of the posterior limb of the left internal capsule, posterior to the left lenticular nucleus and adjacent to the posterior margin of the left operculum.  T2 shine through from remote infarct mid right coronal radiata.  No intracranial hemorrhage.  Mild slightly moderate small vessel disease type changes.  Mild atrophy without hydrocephalus.  No intracranial mass lesion noted on this unenhanced exam.  Bilateral mastoid air cell middle ear opacification without obstructing lesion seen in the region of the posterior superior nasopharynx causing eustachian tube dysfunction. Minimal to mild paranasal sinus  mucosal thickening.  Cervical medullary junction, pituitary region, pineal region and orbital structures unremarkable.  MRA HEAD FINDINGS  Exam is motion degraded.  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 Partial reconstitution of flow distal aspect of the left vertebral artery. Etiology indeterminate. Considerations include atherosclerotic changes versus dissection.  No significant stenosis of the distal vertebral artery.  No significant stenosis of the basilar artery.  Mild irregularity of portions of the superior cerebral arteries and posterior cerebral arteries. No high-grade stenosis.  Mild narrowing, irregularity and areas of ectasia involving the cavernous segment and supraclinoid segment of the internal carotid arteries bilaterally.  Mild narrowing distal M1 segment right middle cerebral artery extending into the bifurcation.  Moderate to marked narrowing proximal left middle cerebral artery branch vessels with mild narrowing of middle cerebral artery branch vessels bilaterally.  Mild to moderate narrowing A2 segment left anterior cerebral artery.  No aneurysm noted.  IMPRESSION: MRI HEAD:  Exam is motion degraded  Acute small to slightly moderate size infarct lateral to the left thalamus, posterior aspect of the posterior limb of the left internal capsule, posterior to the left lenticular nucleus and adjacent to the posterior margin of the left operculum.  Remote infarct mid right coronal radiata.  No intracranial hemorrhage.  Mild to slightly moderate small vessel disease type changes.  Mild atrophy.  Bilateral mastoid air cell middle ear opacification. Minimal to mild paranasal sinus mucosal thickening.  MRA HEAD:  Exam is motion degraded.  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 Partial reconstitution of flow distal aspect of the left vertebral artery. Etiology indeterminate. Considerations include atherosclerotic changes  versus dissection.  Intracranial atherosclerotic type changes otherwise as noted above  Results discussed with the Dr. Nicole Kindred at the time of imaging.   Electronically Signed   By: Chauncey Cruel M.D.   On: 05/16/2013 21:43   Mr Brain Wo Contrast  05/16/2013   CLINICAL DATA:  Slurred speech.  Blurred vision.  EXAM: MRI HEAD WITHOUT CONTRAST  MRA HEAD WITHOUT CONTRAST  TECHNIQUE: Multiplanar, multiecho pulse sequences of the brain and surrounding structures were obtained without intravenous contrast. Angiographic images of the head were obtained using MRA technique without contrast.  COMPARISON:  05/16/2013 head CT.  No comparison brain MR.  FINDINGS: MRI HEAD FINDINGS  Some of the sequences are motion degraded.  Acute small to slightly moderate size infarct lateral to the left  thalamus, posterior aspect of the posterior limb of the left internal capsule, posterior to the left lenticular nucleus and adjacent to the posterior margin of the left operculum.  T2 shine through from remote infarct mid right coronal radiata.  No intracranial hemorrhage.  Mild slightly moderate small vessel disease type changes.  Mild atrophy without hydrocephalus.  No intracranial mass lesion noted on this unenhanced exam.  Bilateral mastoid air cell middle ear opacification without obstructing lesion seen in the region of the posterior superior nasopharynx causing eustachian tube dysfunction. Minimal to mild paranasal sinus mucosal thickening.  Cervical medullary junction, pituitary region, pineal region and orbital structures unremarkable.  MRA HEAD FINDINGS  Exam is motion degraded.  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 Partial reconstitution of flow distal aspect of the left vertebral artery. Etiology indeterminate. Considerations include atherosclerotic changes versus dissection.  No significant stenosis of the distal vertebral artery.  No significant stenosis of the basilar artery.  Mild  irregularity of portions of the superior cerebral arteries and posterior cerebral arteries. No high-grade stenosis.  Mild narrowing, irregularity and areas of ectasia involving the cavernous segment and supraclinoid segment of the internal carotid arteries bilaterally.  Mild narrowing distal M1 segment right middle cerebral artery extending into the bifurcation.  Moderate to marked narrowing proximal left middle cerebral artery branch vessels with mild narrowing of middle cerebral artery branch vessels bilaterally.  Mild to moderate narrowing A2 segment left anterior cerebral artery.  No aneurysm noted.  IMPRESSION: MRI HEAD:  Exam is motion degraded  Acute small to slightly moderate size infarct lateral to the left thalamus, posterior aspect of the posterior limb of the left internal capsule, posterior to the left lenticular nucleus and adjacent to the posterior margin of the left operculum.  Remote infarct mid right coronal radiata.  No intracranial hemorrhage.  Mild to slightly moderate small vessel disease type changes.  Mild atrophy.  Bilateral mastoid air cell middle ear opacification. Minimal to mild paranasal sinus mucosal thickening.  MRA HEAD:  Exam is motion degraded.  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 Partial reconstitution of flow distal aspect of the left vertebral artery. Etiology indeterminate. Considerations include atherosclerotic changes versus dissection.  Intracranial atherosclerotic type changes otherwise as noted above  Results discussed with the Dr. Nicole Kindred at the time of imaging.   Electronically Signed   By: Chauncey Cruel M.D.   On: 05/16/2013 21:43    Oren Binet, MD  [facility] Pager:[phone]  If 7PM-7AM, please contact night-coverage [email or web address] Password [email or web address] 05/18/2013, 2:36 PM   LOS: 2 days

## 2013-05-19 ENCOUNTER — Inpatient Hospital Stay (HOSPITAL_COMMUNITY): Payer: No Typology Code available for payment source

## 2013-05-19 DIAGNOSIS — I69993 Ataxia following unspecified cerebrovascular disease: Secondary | ICD-10-CM

## 2013-05-19 DIAGNOSIS — I6330 Cerebral infarction due to thrombosis of unspecified cerebral artery: Secondary | ICD-10-CM

## 2013-05-19 LAB — BASIC METABOLIC PANEL
BUN: 21 mg/dL (ref 6–23)
CO2: 25 mEq/L (ref 19–32)
Calcium: 9.1 mg/dL (ref 8.4–10.5)
Chloride: 105 mEq/L (ref 96–112)
Creatinine, Ser: 1.56 mg/dL — ABNORMAL HIGH (ref 0.50–1.35)
GFR calc Af Amer: 53 mL/min — ABNORMAL LOW (ref >90)
GFR, EST NON AFRICAN AMERICAN: 46 mL/min — AB (ref >90)
GLUCOSE: 95 mg/dL (ref 70–99)
POTASSIUM: 4.8 meq/L (ref 3.7–5.3)
Sodium: 142 mEq/L (ref 137–147)

## 2013-05-19 MED ORDER — ASPIRIN 81 MG PO TBEC: 81.0000 mg | DELAYED_RELEASE_TABLET | Freq: Every day | ORAL | Status: DC

## 2013-05-19 MED ORDER — CLOPIDOGREL BISULFATE 75 MG PO TABS: 75.0000 mg | ORAL_TABLET | Freq: Every day | ORAL | Status: DC

## 2013-05-19 MED ORDER — NICOTINE 14 MG/24HR TD PT24: 14.0000 mg | MEDICATED_PATCH | Freq: Every day | TRANSDERMAL | Status: DC

## 2013-05-19 MED ORDER — AMLODIPINE BESYLATE 10 MG PO TABS: 10.0000 mg | ORAL_TABLET | Freq: Every day | ORAL | Status: DC

## 2013-05-19 MED ORDER — SIMVASTATIN 20 MG PO TABS: 20.0000 mg | ORAL_TABLET | Freq: Every day | ORAL | Status: DC

## 2013-05-19 NOTE — Discharge Instructions (Addendum)
Follow with Primary MD Leonard Downing, MD in 2 days   Get CBC, CMP, checked 2 days by Primary MD and again as instructed by your Primary MD.   Activity: As tolerated with Full fall precautions use walker/cane & assistance as needed   Disposition Home    Diet: Heart Healthy  For Heart failure patients - Check your Weight same time everyday, if you gain over 2 pounds, or you develop in leg swelling, experience more shortness of breath or chest pain, call your Primary MD immediately. Follow Cardiac Low Salt Diet and 1.8 lit/day fluid restriction.   On your next visit with her primary care physician please Get Medicines reviewed and adjusted.  Please request your Prim.MD to go over all Hospital Tests and Procedure/Radiological results at the follow up, please get all Hospital records sent to your Prim MD by signing hospital release before you go home.   If you experience worsening of your admission symptoms, develop shortness of breath, life threatening emergency, suicidal or homicidal thoughts you must seek medical attention immediately by calling 911 or calling your MD immediately  if symptoms less severe.  You Must read complete instructions/literature along with all the possible adverse reactions/side effects for all the Medicines you take and that have been prescribed to you. Take any new Medicines after you have completely understood and accpet all the possible adverse reactions/side effects.   Do not drive and provide baby sitting services if your were admitted for syncope or siezures until you have seen by Primary MD or a Neurologist and advised to do so again.  Do not drive when taking Pain medications.    Do not take more than prescribed Pain, Sleep and Anxiety Medications  Special Instructions: If you have smoked or chewed Tobacco  in the last 2 yrs please stop smoking, stop any regular Alcohol  and or any Recreational drug use.  Wear Seat belts while  driving.   Please note  You were cared for by a hospitalist during your hospital stay. If you have any questions about your discharge medications or the care you received while you were in the hospital after you are discharged, you can call the unit and asked to speak with the hospitalist on call if the hospitalist that took care of you is not available. Once you are discharged, your primary care physician will handle any further medical issues. Please note that NO REFILLS for any discharge medications will be authorized once you are discharged, as it is imperative that you return to your primary care physician (or establish a relationship with a primary care physician if you do not have one) for your aftercare needs so that they can reassess your need for medications and monitor your lab values. STROKE/TIA DISCHARGE INSTRUCTIONS SMOKING Cigarette smoking nearly doubles your risk of having a stroke & is the single most alterable risk factor  If you smoke or have smoked in the last 12 months, you are advised to quit smoking for your health.  Most of the excess cardiovascular risk related to smoking disappears within a year of stopping.  Ask you doctor about anti-smoking medications  [place] Quit Line: 1-800-QUIT NOW  Free Smoking Cessation Classes [phone]  CHOLESTEROL Know your levels; limit fat & cholesterol in your diet  Lipid Panel     Component Value Date/Time   CHOL 170 05/17/2013 0415   TRIG 200* 05/17/2013 0415   HDL 25* 05/17/2013 0415   CHOLHDL 6.8 05/17/2013 0415   VLDL 40  05/17/2013 0415   LDLCALC 105* 05/17/2013 0415      Many patients benefit from treatment even if their cholesterol is at goal.  Goal: Total Cholesterol (CHOL) less than 160  Goal:  Triglycerides (TRIG) less than 150  Goal:  HDL greater than 40  Goal:  LDL (LDLCALC) less than 100   BLOOD PRESSURE American Stroke Association blood pressure target is less that 120/80 mm/Hg  Your discharge blood pressure is:   BP: 154/53 mmHg  Monitor your blood pressure  Limit your salt and alcohol intake  Many individuals will require more than one medication for high blood pressure  DIABETES (A1c is a blood sugar average for last 3 months) Goal HGBA1c is under 7% (HBGA1c is blood sugar average for last 3 months)  Diabetes: No known diagnosis of diabetes    Lab Results  Component Value Date   HGBA1C 5.7* 05/17/2013     Your HGBA1c can be lowered with medications, healthy diet, and exercise.  Check your blood sugar as directed by your physician  Call your physician if you experience unexplained or low blood sugars.  PHYSICAL ACTIVITY/REHABILITATION Goal is 30 minutes at least 4 days per week  Activity: Increase activity slowly, Therapies:Occupation/Speech/ Physical Therapy: Home Health Return to work:   Activity decreases your risk of heart attack and stroke and makes your heart stronger.  It helps control your weight and blood pressure; helps you relax and can improve your mood.  Participate in a regular exercise program.  Talk with your doctor about the best form of exercise for you (dancing, walking, swimming, cycling).  DIET/WEIGHT Goal is to maintain a healthy weight  Your discharge diet is: Cardiac  Your height is:  Height: 5\' 7"  (170.2 cm) Your current weight is: Weight: 109.408 kg (241 lb 3.2 oz) Your Body Mass Index (BMI) is:  BMI (Calculated): 37.9  Following the type of diet specifically designed for you will help prevent another stroke.  Your goal weight range is:    Your goal Body Mass Index (BMI) is 19-24.  Healthy food habits can help reduce 3 risk factors for stroke:  High cholesterol, hypertension, and excess weight.  RESOURCES Stroke/Support Group:  Call [phone]   STROKE EDUCATION PROVIDED/REVIEWED AND GIVEN TO PATIENT Stroke warning signs and symptoms How to activate emergency medical system (call 911). Medications prescribed at discharge. Need for follow-up after  discharge. Personal risk factors for stroke. Pneumonia vaccine given: Yes, Date 05/17/13 Flu vaccine given: Yes, Date 05/17/13 My questions have been answered, the writing is legible, and I understand these instructions.  I will adhere to these goals & educational materials that have been provided to me after my discharge from the hospital.    [place] arranged with [place]. [phone]. Registered Nurse, Physical, Occupational and Speech Therapies.

## 2013-05-19 NOTE — Discharge Summary (Addendum)
Luke Kaufman, is a 63 y.o. male  DOB 18-Dec-1950  MRN [id number].  Admission date:  05/16/2013  Admitting Physician  Etta Quill, DO  Discharge Date:  05/19/2013   Primary MD  Leonard Downing, MD  Recommendations for primary care physician for things to follow:   Follow renal function closely please make sure patient follows with recommended nephrologist and neurologist within a reasonable time frame   Admission Diagnosis  Vertebral artery occlusion [433.20] HTN (hypertension) [401.9] CVA (cerebral infarction) [434.91]  Discharge Diagnosis   CVA  Principal Problem:   CVA (cerebral infarction) Active Problems:   HTN (hypertension)   Vertebral artery occlusion      Past Medical History  Diagnosis Date  . Hypertension   . Gastric ulcer     History reviewed. No pertinent past surgical history.   Discharge Condition: stable    Follow UP  Follow-up Information   Follow up with Leonard Downing, MD. Schedule an appointment as soon as possible for a visit in 2 days.   Specialty:  Family Medicine   Contact information:   [place] [address] [phone]       Follow up with Forbes Cellar, MD. Schedule an appointment as soon as possible for a visit in 4 weeks.   Specialties:  Neurology, Radiology   Contact information:   [address] [place] [address] [phone]       Follow up with Ulla Potash., MD. Schedule an appointment as soon as possible for a visit in 1 week.   Specialty:  Nephrology   Contact information:   [place] [address] [phone]         Consults obtained - Neuro   Discharge Medications      Medication List    STOP taking these medications       ibuprofen 200 MG tablet    Commonly known as:  ADVIL,MOTRIN     lisinopril 10 MG tablet  Commonly known as:  PRINIVIL,ZESTRIL     traMADol 50 MG tablet  Commonly known as:  ULTRAM      TAKE these medications       amLODipine 10 MG tablet  Commonly known as:  NORVASC  Take 1 tablet (10 mg total) by mouth daily.     aspirin 81 MG EC tablet  Take 1 tablet (81 mg total) by mouth daily.     clopidogrel 75 MG tablet  Commonly known as:  PLAVIX  Take 1 tablet (75 mg total) by mouth daily with breakfast.     nicotine 14 mg/24hr patch  Commonly known as:  NICODERM CQ - dosed in mg/24 hours  Place 1 patch (14 mg total) onto the skin daily.     simvastatin 20 MG tablet  Commonly known as:  ZOCOR  Take 1 tablet (20 mg total) by mouth daily at 6 PM.         Diet and Activity recommendation: See Discharge Instructions below  Discharge Instructions     Follow with Primary MD Leonard Downing, MD in 2 days   Get CBC, CMP, checked 2 days by Primary MD and again as instructed by your Primary MD.   Activity: As tolerated with Full fall precautions use walker/cane & assistance as needed   Disposition rehabilitation/CIR if qualifies or with home health PT and RN.   Diet: Heart Healthy  For Heart failure patients - Check your Weight same time everyday, if you gain over 2 pounds, or you develop in leg swelling, experience more shortness of breath or chest pain, call your Primary MD immediately. Follow Cardiac Low Salt Diet and 1.8 lit/day fluid restriction.   On your next visit with her primary care physician please Get Medicines reviewed and adjusted.  Please request your Prim.MD to go over all Hospital Tests and Procedure/Radiological results at the follow up, please get all Hospital records sent to your Prim MD by signing hospital release before you go home.   If you experience worsening of your admission symptoms, develop shortness of breath, life threatening emergency, suicidal or homicidal  thoughts you must seek medical attention immediately by calling 911 or calling your MD immediately  if symptoms less severe.  You Must read complete instructions/literature along with all the possible adverse reactions/side effects for all the Medicines you take and that have been prescribed to you. Take any new Medicines after you have completely understood and accpet all the possible adverse reactions/side effects.   Do not drive and provide baby sitting services if your were admitted for syncope or siezures until you have seen by Primary MD or a Neurologist and advised to do so again.  Do not drive when taking Pain medications.    Do not take more than prescribed Pain, Sleep and Anxiety Medications  Special Instructions: If you have smoked or chewed Tobacco  in the last 2 yrs please stop smoking, stop any regular Alcohol  and or any Recreational drug use.  Wear Seat belts while driving.   Please note  You were cared for by a hospitalist during your hospital stay. If you have any questions about your discharge medications or the care you received while you were in the hospital after you are discharged, you can call the unit and asked to speak with the hospitalist on call if the hospitalist that took care of you is not available. Once you are discharged, your primary care physician will handle any further medical issues. Please note that NO REFILLS for any discharge medications will be authorized once you are discharged, as it is imperative that you return to your primary care physician (or establish a relationship with a primary care physician if you do not have one) for your aftercare needs so that they can reassess your need for medications and monitor your lab values.     Major procedures and Radiology Reports - PLEASE review detailed and final reports for all details, in brief -       Ct Head Wo Contrast  05/16/2013   CLINICAL DATA:  Diplopia.  Slurred speech.  Hypertensive  patient.  EXAM: CT HEAD WITHOUT CONTRAST  TECHNIQUE: Contiguous axial images were obtained from the base of the skull through the vertex without intravenous contrast.  COMPARISON:  None.  FINDINGS: No intracranial hemorrhage.  Superior right lenticular nucleus/ corona radiata small infarct. This appears remote. Acute infarct less likely consideration although not entirely excluded.  Slightly dense appearance of the basilar tip/ proximal posterior cerebral arteries. This  may be normal. Thrombus not excluded in the proper clinical setting.  Hypodensity right pons may be related to streak artifact limiting detection for presence of small acute infarct.  No hydrocephalus.  No intracranial mass lesion noted on this unenhanced exam.  Vascular calcifications.  Bilateral mastoid air cell opacification without destruction of the septi. No obvious mass posterior superior nasopharynx detected as cause of eustachian tube dysfunction. This is incompletely assessed on present exam.  IMPRESSION: No intracranial hemorrhage.  Superior right lenticular nucleus/ corona radiata small infarct. This appears remote. Acute infarct less likely consideration although not entirely excluded.  Slightly dense appearance of the basilar tip/ proximal posterior cerebral arteries. This may be normal. Thrombus not excluded in the proper clinical setting.  Hypodensity right pons may be related to streak artifact limiting detection for presence of small acute infarct.  Bilateral mastoid air cell opacification  Images reviewed with Dr. Nicole Kindred  on 05/16/2013  at 7:37 PM.   Electronically Signed   By: Chauncey Cruel M.D.   On: 05/16/2013 19:45   Mr Jodene Nam Head Wo Contrast  05/16/2013   CLINICAL DATA:  Slurred speech.  Blurred vision.  EXAM: MRI HEAD WITHOUT CONTRAST  MRA HEAD WITHOUT CONTRAST  TECHNIQUE: Multiplanar, multiecho pulse sequences of the brain and surrounding structures were obtained without intravenous contrast. Angiographic images of the head  were obtained using MRA technique without contrast.  COMPARISON:  05/16/2013 head CT.  No comparison brain MR.  FINDINGS: MRI HEAD FINDINGS  Some of the sequences are motion degraded.  Acute small to slightly moderate size infarct lateral to the left thalamus, posterior aspect of the posterior limb of the left internal capsule, posterior to the left lenticular nucleus and adjacent to the posterior margin of the left operculum.  T2 shine through from remote infarct mid right coronal radiata.  No intracranial hemorrhage.  Mild slightly moderate small vessel disease type changes.  Mild atrophy without hydrocephalus.  No intracranial mass lesion noted on this unenhanced exam.  Bilateral mastoid air cell middle ear opacification without obstructing lesion seen in the region of the posterior superior nasopharynx causing eustachian tube dysfunction. Minimal to mild paranasal sinus mucosal thickening.  Cervical medullary junction, pituitary region, pineal region and orbital structures unremarkable.  MRA HEAD FINDINGS  Exam is motion degraded.  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 Partial reconstitution of flow distal aspect of the left vertebral artery. Etiology indeterminate. Considerations include atherosclerotic changes versus dissection.  No significant stenosis of the distal vertebral artery.  No significant stenosis of the basilar artery.  Mild irregularity of portions of the superior cerebral arteries and posterior cerebral arteries. No high-grade stenosis.  Mild narrowing, irregularity and areas of ectasia involving the cavernous segment and supraclinoid segment of the internal carotid arteries bilaterally.  Mild narrowing distal M1 segment right middle cerebral artery extending into the bifurcation.  Moderate to marked narrowing proximal left middle cerebral artery branch vessels with mild narrowing of middle cerebral artery branch vessels bilaterally.  Mild to moderate  narrowing A2 segment left anterior cerebral artery.  No aneurysm noted.  IMPRESSION: MRI HEAD:  Exam is motion degraded  Acute small to slightly moderate size infarct lateral to the left thalamus, posterior aspect of the posterior limb of the left internal capsule, posterior to the left lenticular nucleus and adjacent to the posterior margin of the left operculum.  Remote infarct mid right coronal radiata.  No intracranial hemorrhage.  Mild to slightly moderate small vessel disease  type changes.  Mild atrophy.  Bilateral mastoid air cell middle ear opacification. Minimal to mild paranasal sinus mucosal thickening.  MRA HEAD:  Exam is motion degraded.  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 Partial reconstitution of flow distal aspect of the left vertebral artery. Etiology indeterminate. Considerations include atherosclerotic changes versus dissection.  Intracranial atherosclerotic type changes otherwise as noted above  Results discussed with the Dr. Nicole Kindred at the time of imaging.   Electronically Signed   By: Chauncey Cruel M.D.   On: 05/16/2013 21:43   Mr Brain Wo Contrast  05/16/2013   CLINICAL DATA:  Slurred speech.  Blurred vision.  EXAM: MRI HEAD WITHOUT CONTRAST  MRA HEAD WITHOUT CONTRAST  TECHNIQUE: Multiplanar, multiecho pulse sequences of the brain and surrounding structures were obtained without intravenous contrast. Angiographic images of the head were obtained using MRA technique without contrast.  COMPARISON:  05/16/2013 head CT.  No comparison brain MR.  FINDINGS: MRI HEAD FINDINGS  Some of the sequences are motion degraded.  Acute small to slightly moderate size infarct lateral to the left thalamus, posterior aspect of the posterior limb of the left internal capsule, posterior to the left lenticular nucleus and adjacent to the posterior margin of the left operculum.  T2 shine through from remote infarct mid right coronal radiata.  No intracranial  hemorrhage.  Mild slightly moderate small vessel disease type changes.  Mild atrophy without hydrocephalus.  No intracranial mass lesion noted on this unenhanced exam.  Bilateral mastoid air cell middle ear opacification without obstructing lesion seen in the region of the posterior superior nasopharynx causing eustachian tube dysfunction. Minimal to mild paranasal sinus mucosal thickening.  Cervical medullary junction, pituitary region, pineal region and orbital structures unremarkable.  MRA HEAD FINDINGS  Exam is motion degraded.  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 Partial reconstitution of flow distal aspect of the left vertebral artery. Etiology indeterminate. Considerations include atherosclerotic changes versus dissection.  No significant stenosis of the distal vertebral artery.  No significant stenosis of the basilar artery.  Mild irregularity of portions of the superior cerebral arteries and posterior cerebral arteries. No high-grade stenosis.  Mild narrowing, irregularity and areas of ectasia involving the cavernous segment and supraclinoid segment of the internal carotid arteries bilaterally.  Mild narrowing distal M1 segment right middle cerebral artery extending into the bifurcation.  Moderate to marked narrowing proximal left middle cerebral artery branch vessels with mild narrowing of middle cerebral artery branch vessels bilaterally.  Mild to moderate narrowing A2 segment left anterior cerebral artery.  No aneurysm noted.  IMPRESSION: MRI HEAD:  Exam is motion degraded  Acute small to slightly moderate size infarct lateral to the left thalamus, posterior aspect of the posterior limb of the left internal capsule, posterior to the left lenticular nucleus and adjacent to the posterior margin of the left operculum.  Remote infarct mid right coronal radiata.  No intracranial hemorrhage.  Mild to slightly moderate small vessel disease type changes.  Mild  atrophy.  Bilateral mastoid air cell middle ear opacification. Minimal to mild paranasal sinus mucosal thickening.  MRA HEAD:  Exam is motion degraded.  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 Partial reconstitution of flow distal aspect of the left vertebral artery. Etiology indeterminate. Considerations include atherosclerotic changes versus dissection.  Intracranial atherosclerotic type changes otherwise as noted above  Results discussed with the Dr. Nicole Kindred at the time of imaging.   Electronically Signed  By: Chauncey Cruel M.D.   On: 05/16/2013 21:43    Renal US - no acute changes   Micro Results      No results found for this or any previous visit (from the past 240 hour(s)).   History of present illness and  Hospital Course:     Kindly see H&P for history of present illness and admission details, please review complete Labs, Consult reports and Test reports for all details in brief Mccray Angermeier, is a 63 y.o. male, patient with history of hypertension, gastric ulcer who was admitted few days ago with acute onset of diplopia and vertigo likely secondary to CVA, CT scan of his head showed probable old right lenticular nucleus/corona radiata small infarct. Slight hyperdensity of the basilar tip and proximal cerebral arteries was noted. Thrombus cannot be excluded. There was also an area of possible hypodensity involving the right pons likely artifact. NIH stroke score was 0. Patient was not deemed a candidate for TPA because of minimal residual symptoms. MRI study showed an acute small to slightly moderate size infarct lateral to the left thalamus, posterior aspect of the posterior limb of the left internal capsule, posterior to the left lenticular nucleus and adjacent to the posterior margin of the left operculum. 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 Etiology considerations  included atherosclerotic changes as well as possible dissection. Patient was not administerd TPA secondary to Minimal residual deficits with 0 NIH score . He was admitted for further evaluation and treatment.     He was seen by neurology. He was started on aspirin, statin and Plavix for secondary prevention, he was scheduled to go for CT angiogram of the head and neck however this could not be done due to renal insufficiency, plan is to continue antiplatelet regimen with close outpatient followup with neurology. If qualifies he'll be discharged to appropriate rehabilitation. If not he will go to home with home health PT etc. I discussed his case personally with neurologist on call Dr. Leonie Man.    Hypertension. Stable on Norvasc.     Tobacco abuse. Counseled to quit.    Renal insufficiency. Called his primary care physician's office last creatinine in 2013 was 1, he has been legally using NSAIDs at home and was on lisinopril, despite IV fluids his renal function is staying stable at 1.5, UA and Renal ultrasound is unremarkable, he has good urine output. At this time I will withhold and discontinue NSAIDs and ACE inhibitors, I have called his PCP office and inform them that renal function should be monitored closely, have informed this to the patient also, we'll recommend repeating BMP in 3-5 days' time, also outpatient renal followup is recommended post discharge.     Today   Subjective:   Ashar Carmel today has no headache,no chest abdominal pain,no new weakness tingling or numbness, feels much better.  Objective:   Blood pressure 158/86, pulse 81, temperature 98.2 F (36.8 C), temperature source Oral, resp. rate 16, height 5\' 7"  (1.702 m), weight 109.408 kg (241 lb 3.2 oz), SpO2 93.00%.   Intake/Output Summary (Last 24 hours) at 05/19/13 0815 Last data filed at 05/18/13 1800  Gross per 24 hour  Intake 1480.42 ml  Output      0 ml  Net 1480.42 ml    Exam Awake Alert,  Oriented *3, No new F.N deficits, Normal affect [place].AT,PERRAL Supple Neck,No JVD, No cervical lymphadenopathy appriciated.  Symmetrical Chest wall movement, Good air  movement bilaterally, CTAB RRR,No Gallops,Rubs or new Murmurs, No Parasternal Heave +ve B.Sounds, Abd Soft, Non tender, No organomegaly appriciated, No rebound -guarding or rigidity. No Cyanosis, Clubbing or edema, No new Rash or bruise  Data Review   CBC w Diff: Lab Results  Component Value Date   WBC 11.6* 05/16/2013   HGB 15.0 05/16/2013   HCT 44.0 05/16/2013   PLT 248 05/16/2013   LYMPHOPCT 13 05/16/2013   MONOPCT 5 05/16/2013   EOSPCT 1 05/16/2013   BASOPCT 0 05/16/2013    CMP: Lab Results  Component Value Date   NA 142 05/19/2013   K 4.8 05/19/2013   CL 105 05/19/2013   CO2 25 05/19/2013   BUN 21 05/19/2013   CREATININE 1.56* 05/19/2013   PROT 7.3 05/16/2013   ALBUMIN 3.6 05/16/2013   BILITOT <0.2* 05/16/2013   ALKPHOS 69 05/16/2013   AST 17 05/16/2013   ALT 11 05/16/2013  .   Total Time in preparing paper work, data evaluation and todays exam - 35 minutes  Thurnell Lose M.D on 05/19/2013 at 8:15 AM  [place]  [phone]

## 2013-05-19 NOTE — Progress Notes (Signed)
Stroke Team Progress Note  HISTORY Wyler Southwood is an 63 y.o. male history of hypertension who experienced acute onset of diplopia and vertigo while driving at about [id number] PM this afternoon 05/16/2013. He has no previous history of stroke nor TIA. He has not been on antiplatelet therapy. He also has slurred speech which cleared after arriving in the emergency room. No focal weakness nor focal numbness was noted. CT scan of his head showed probable old right lenticular nucleus/corona radiata small infarct. Slight hyperdensity of the basilar tip and proximal cerebral arteries was noted. Thrombus cannot be excluded. There was also an area of possible hypodensity involving the right pons likely artifact. NIH stroke score was 0. Patient was not deemed a candidate for TPA because of minimal residual symptoms. MRI study showed an acute small to slightly moderate size infarct lateral to the left thalamus, posterior aspect of the posterior limb of the left internal capsule, posterior to the left lenticular nucleus and adjacent to the posterior margin of the left operculum. 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 Etiology considerations included atherosclerotic changes as well as possible dissection. Patient was not administerd TPA secondary to Minimal residual deficits with 0 NIH score . He was admitted for further evaluation and treatment.  He was placed on IV heparin over night due to MRI/A findings.  SUBJECTIVE No family at bedside. Pt feels his diplopia is improving. "I didn't know I had kidney problems until yesterday."  OBJECTIVE Most recent Vital Signs: Filed Vitals:   05/18/13 1600 05/18/13 1957 05/18/13 2357 05/19/13 0400  BP: 166/52 141/96 140/68 158/86  Pulse: 74 85 81 81  Temp: 98.8 F (37.1 C) 98.4 F (36.9 C) 98 F (36.7 C) 98.2 F (36.8 C)  TempSrc: Oral Oral Oral Oral  Resp:  18 18 16   Height:      Weight:       SpO2: 99% 97% 95% 93%   CBG (last 3)   Recent Labs  05/16/13 1926  GLUCAP 113*    IV Fluid Intake:   . sodium chloride 50 mL/hr at 05/19/13 0739    MEDICATIONS  . amLODipine  10 mg Oral Daily  . aspirin EC  81 mg Oral Daily  . clopidogrel  75 mg Oral Q breakfast  . enoxaparin (LOVENOX) injection  40 mg Subcutaneous Q24H  . gi cocktail  30 mL Oral Once  . nicotine  14 mg Transdermal Daily  . simvastatin  20 mg Oral q1800   PRN:  acetaminophen, morphine injection, ondansetron (ZOFRAN) IV, oxyCODONE  Diet:  Cardiac thin liquids Activity:  OOB with assistance DVT Prophylaxis:  Lovenox 40 mg sq daily   CLINICALLY SIGNIFICANT STUDIES Basic Metabolic Panel:   Recent Labs Lab 05/18/13 0940 05/19/13 0407  NA 139 142  K 4.3 4.8  CL 102 105  CO2 23 25  GLUCOSE 111* 95  BUN 20 21  CREATININE 1.42* 1.56*  CALCIUM 9.2 9.1   Liver Function Tests:   Recent Labs Lab 05/16/13 1905  AST 17  ALT 11  ALKPHOS 69  BILITOT <0.2*  PROT 7.3  ALBUMIN 3.6   CBC:   Recent Labs Lab 05/16/13 1905 05/16/13 1921  WBC 11.6*  --   NEUTROABS 9.3*  --   HGB 14.4 15.0  HCT 40.6 44.0  MCV 86.6  --   PLT 248  --    Coagulation:   Recent Labs Lab 05/16/13 1905  LABPROT 13.2  INR 1.02   Cardiac Enzymes:   Recent Labs Lab 05/16/13 1905  TROPONINI <0.30   Urinalysis:   Recent Labs Lab 05/17/13 0307  COLORURINE YELLOW  LABSPEC 1.016  PHURINE 5.5  GLUCOSEU NEGATIVE  HGBUR NEGATIVE  BILIRUBINUR NEGATIVE  KETONESUR NEGATIVE  PROTEINUR NEGATIVE  UROBILINOGEN 0.2  NITRITE NEGATIVE  LEUKOCYTESUR NEGATIVE   Lipid Panel    Component Value Date/Time   CHOL 170 05/17/2013 0415   TRIG 200* 05/17/2013 0415   HDL 25* 05/17/2013 0415   CHOLHDL 6.8 05/17/2013 0415   VLDL 40 05/17/2013 0415   LDLCALC 105* 05/17/2013 0415   HgbA1C  Lab Results  Component Value Date   HGBA1C 5.7* 05/17/2013    Urine Drug Screen:     Component Value Date/Time   LABOPIA NONE DETECTED  05/17/2013 0307   COCAINSCRNUR NONE DETECTED 05/17/2013 0307   LABBENZ NONE DETECTED 05/17/2013 0307   AMPHETMU NONE DETECTED 05/17/2013 0307   THCU NONE DETECTED 05/17/2013 0307   LABBARB NONE DETECTED 05/17/2013 0307    Alcohol Level:   Recent Labs Lab 05/16/13 1905  ETH <11    CT of the brain  05/16/2013    No intracranial hemorrhage.  Superior right lenticular nucleus/ corona radiata small infarct. This appears remote. Acute infarct less likely consideration although not entirely excluded.  Slightly dense appearance of the basilar tip/ proximal posterior cerebral arteries. This may be normal. Thrombus not excluded in the proper clinical setting.  Hypodensity right pons may be related to streak artifact limiting detection for presence of small acute infarct.  Bilateral mastoid air cell opacification    MRI of the brain  05/16/2013   Exam is motion degraded  Acute small to slightly moderate size infarct lateral to the left thalamus, posterior aspect of the posterior limb of the left internal capsule, posterior to the left lenticular nucleus and adjacent to the posterior margin of the left operculum.  Remote infarct mid right coronal radiata.  No intracranial hemorrhage.  Mild to slightly moderate small vessel disease type changes.  Mild atrophy.  Bilateral mastoid air cell middle ear opacification. Minimal to mild paranasal sinus mucosal thickening.    MRA of the brain  05/16/2013  Exam is motion degraded.  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 Partial reconstitution of flow distal aspect of the left vertebral artery. Etiology indeterminate. Considerations include atherosclerotic changes versus dissection.  Intracranial atherosclerotic type changes   2D Echocardiogram  EF 60-65% with no source of embolus.   Carotid Doppler  Bilateral: 1-39% ICA stenosis. Right vertebral artery flow is antegrade. Left vertebral artery flow is retrograde consistent with an  occlusion.  EKG  normal sinus rhythm. For complete results please see formal report.   Therapy Recommendations CIR  Physical Exam   Awake alert. Afebrile. Head is nontraumatic. Neck is supple without bruit. Hearing is normal. Cardiac exam no murmur or gallop. Lungs are clear to auscultation. Distal pulses are well felt. Neurological Exam :  ;  Awake  Alert oriented x 3. Normal speech and language.eye movements full without nystagmus.fundi were not visualized. Vision acuity  appear normal. Visual fields appear full today  to bedside confrontational testing. Has subjective vertigo diplopia when looking straight  Down in  vertical plane.Hearing is normal. Palatal movements are normal. Face symmetric. Tongue midline. Normal strength, tone, reflexes and coordination. Normal sensation. Gait deferred.  ASSESSMENT [name] is a 63 y.o. male presenting with acute onset of diplopia and vertigo. 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 Suspect current stroke is brainstem and not seen on MRI given presenting and ongoing symptoms of diplopia. MRA shows LVA occlusion with retrograde flow; RVA dominant. Carotid shows LVA with retrograde flow. Current infarct felt to be thrombotic secondary to small vessel disease.  On no antithrombotics prior to admission. Now on aspirin 81 mg orally every day and clopidogrel 75 mg orally every day full dose, no bolus, for secondary stroke prevention. Patient with resultant diplopia, right field cut - his field cut is improving. Work up underway.  Headache, addressed with tylenol Hypertension, lisinopril on hold, amlodipine started Hyperlipidemia, LDL 105, on no statin PTA, now on zocor 20 mg daily, goal LDL < 100  Cigarette smoker, 2 PPD Renal insufficiency Cr 1.5 Obesity, Body mass index is 37.77 kg/(m^2).  No significant difference in right and left arm BPs, not indicative for subclavian steal  Hospital day  # 3  TREATMENT/PLAN  Continue  aspirin 81 mg orally every day and clopidogrel 75 mg orally every day for secondary stroke prevention.  Unable to check CT angio head and neck  to confirm stenoses due to elevated Cr. Dr. Leonie Man will follow up as an OP and consider further testing at that time  Continue statin  Stop smoking, nicotine patch  Rehab consult in place - home vs rehab depending of progress - therapy to re-eval today  Patient has a 10-15% risk of having another stroke over the next year, the highest risk is within 2 weeks of the most recent stroke/TIA (risk of having a stroke following a stroke or TIA is the same). Ongoing risk factor control by Primary Care Physician Stroke Service will sign off. Please call should any needs arise. Follow up with Dr. Leonie Man, [facility], in 2 months.   Burnetta Sabin, MSN, RN, ANVP-BC, ANP-BC, [facility] Pager: [phone] 05/19/2013 8:12 AM  I have personally obtained a history, examined the patient, evaluated imaging results, and formulated the assessment and plan of care. I agree with the above. Antony Contras, MD

## 2013-05-19 NOTE — Progress Notes (Signed)
Rehab admissions - Evaluated for possible admission.  Patient did very well with therapies today.  Per unit case manager, plan is to discharge home with [facility] therapies.  Call me for questions.  RC:[id number]

## 2013-05-19 NOTE — Progress Notes (Signed)
Physical Therapy Treatment Patient Details Name: Luke Kaufman MRN: [id number] DOB: Dec 22, 1950 Today's Date: 05/19/2013 Time: 1050-1110 PT Time Calculation (min): 20 min  PT Assessment / Plan / Recommendation  History of Present Illness 63 y.o. male admitted to [facility] on 05/16/13 with acute onset of diplopia and vertigo while driving at about [id number] pm this afternoon.  No history of stroke nor TIA that he knows about (old stroke demonstrated on imaging).  Also had slurred speech which cleared after arrival to the ED.  CT head shows and MRI confirms an old R lenticular nucleus / corona radiata small infarct.  MRI demonstrates a new infarct lateral to L thalamus.     PT Comments   Pt is progressing well with mobility.  He is moving around his hospital room independently.  He still has some safety and balance issues, but is likely too high functioning to qualify for inpatient rehab.  He unfortunately, does not have 24/7 assist at home and there will still be some challenging situations that he will have to work through, but I believe at this point he will be best served to go home with max [facility] services (PT/OT SLP and an aide).  He does continue to understand and verbalize that he cannot drive.    Follow Up Recommendations  Home health PT;Supervision - Intermittent;Other (comment) (max HH services)     Does the patient have the potential to tolerate intense rehabilitation    NA  Barriers to Discharge  decreased caregiver support      Equipment Recommendations  None recommended by PT    Recommendations for Other Services   None  Frequency Min 4X/week   Progress towards PT Goals Progress towards PT goals: Progressing toward goals  Plan Discharge plan needs to be updated    Precautions / Restrictions Precautions Precautions: Fall Precaution Comments: pt often times moves too quickly and not cautiously enough and his vision is impaired, so it causes him to trip.   Restrictions Weight Bearing  Restrictions: No   Pertinent Vitals/Pain See vitals flow sheet.     Mobility  Bed Mobility Overal bed mobility: Independent Transfers Overall transfer level: Needs assistance Equipment used: None Transfers: Sit to/from Stand Sit to Stand: Modified independent (Device/Increase time) General transfer comment: uses hands for transitions Ambulation/Gait Ambulation/Gait assistance: Supervision Ambulation Distance (Feet): 350 Feet Assistive device: None Gait Pattern/deviations: Step-through pattern;Staggering right;Staggering left Gait velocity: pt is actually walking faster than what is safe given his vision deficits and increased need to scan environment.   General Gait Details: Pt staggering to right and left today. He only ran into one door frame on his right and he tripped up the stairs when practicing the stairs.   Stairs: Yes Stairs assistance: Min assist Stair Management: One rail Right;Alternating pattern;Forwards Number of Stairs: 18 General stair comments: Generally, pt was mod I going up and down the stairs with a reciprocal gait pattern, but he was going very quickly (almost running) and tripped on one of the stairs requiring min assit to prevent LOB when going up the stairs.  I urged him to slow down both his walking speed and his stair speed due to safety.  He could not afford to fall (especially down the stairs) at this point.   Modified Rankin (Stroke Patients Only) Pre-Morbid Rankin Score: No symptoms Modified Rankin: Moderately severe disability      PT Goals (current goals can now be found in the care plan section) Acute Rehab PT Goals Patient Stated Goal:  I want to see my dog  Visit Information  Last PT Received On: 05/19/13 Assistance Needed: +1 History of Present Illness: 63 y.o. male admitted to [facility] on 05/16/13 with acute onset of diplopia and vertigo while driving at about [id number] pm this afternoon.  No history of stroke nor TIA that he knows about (old stroke  demonstrated on imaging).  Also had slurred speech which cleared after arrival to the ED.  CT head shows and MRI confirms an old R lenticular nucleus / corona radiata small infarct.  MRI demonstrates a new infarct lateral to L thalamus.      Subjective Data  Subjective: Pt reports he is ready to go home now.   Patient Stated Goal: I want to see my dog   Cognition  Cognition Arousal/Alertness: Awake/alert Behavior During Therapy: WFL for tasks assessed/performed Overall Cognitive Status: Impaired/Different from baseline General Comments: see SLP notes re: higher level cognitive deficits.     Balance  Standardized Balance Assessment Standardized Balance Assessment : Berg Balance Test;Dynamic Gait Index Berg Balance Test Sit to Stand: Able to stand without using hands and stabilize independently Standing Unsupported: Able to stand safely 2 minutes Sitting with Back Unsupported but Feet Supported on Floor or Stool: Able to sit safely and securely 2 minutes Stand to Sit: Sits safely with minimal use of hands Transfers: Able to transfer safely, minor use of hands Standing Unsupported with Eyes Closed: Able to stand 10 seconds safely Standing Ubsupported with Feet Together: Able to place feet together independently and stand 1 minute safely From Standing, Reach Forward with Outstretched Arm: Can reach confidently >25 cm (10") From Standing Position, Pick up Object from Floor: Able to pick up shoe safely and easily From Standing Position, Turn to Look Behind Over each Shoulder: Looks behind from both sides and weight shifts well Turn 360 Degrees: Able to turn 360 degrees safely in 4 seconds or less Standing Unsupported, Alternately Place Feet on Step/Stool: Able to complete 4 steps without aid or supervision Standing Unsupported, One Foot in Front: Able to plae foot ahead of the other independently and hold 30 seconds Standing on One Leg: Unable to try or needs assist to prevent fall Total  Score: 49 (this still puts him at a moderate fall risk ~50% without an assistive device) Dynamic Gait Index Level Surface: Normal Change in Gait Speed: Normal Gait with Horizontal Head Turns: Mild Impairment Gait with Vertical Head Turns: Mild Impairment Gait and Pivot Turn: Normal Step Over Obstacle: Mild Impairment Step Around Obstacles: Normal Steps: Mild Impairment Total Score: 20 (<19 puts him at risk for recurrent falls, so he is just over this threshold)  End of Session PT - End of Session Activity Tolerance: Patient tolerated treatment well Patient left: in chair;with call bell/phone within reach     [place]. [place], [place], DPT [phone]   05/19/2013, 12:02 PM

## 2013-05-19 NOTE — Progress Notes (Addendum)
Occupational Therapy Treatment Patient Details Name: Luke Kaufman MRN: [id number] DOB: 12-03-1950 Today's Date: 05/19/2013 Time:  - [id number] and 1015 - 1025  29 minutes  OT Assessment / Plan / Recommendation  History of present illness 63 y.o. male admitted to [facility] on 05/16/13 with acute onset of diplopia and vertigo while driving at about [id number] pm this afternoon.  No history of stroke nor TIA that he knows about (old stroke demonstrated on imaging).  Also had slurred speech which cleared after arrival to the ED.  CT head shows and MRI confirms an old R lenticular nucleus / corona radiata small infarct.  MRI demonstrates a new infarct lateral to L thalamus.     OT comments  Pt is progressing with OT.  Able to locate all items scattered around room.  With paper/pencil task (letters), he had more difficulty. Pt wants to return home with dog with neighbors for support. Knows he cannot drive and talked to him about HHOT looking at bathroom to recommend appropriate seat/bench.  Recommend someone be with him when he prepares meals also, until assessed by [facility].  Follow Up Recommendations  Home health OT;Supervision - Intermittent (supervision for shower:  meal prep)    Barriers to Discharge       Equipment Recommendations   (HHOT to assess for tub seat/shower)    Recommendations for Other Services    Frequency Min 2X/week   Progress towards OT Goals Progress towards OT goals: Progressing toward goals  Plan      Precautions / Restrictions Precautions Precautions: Fall Restrictions Weight Bearing Restrictions: No   Pertinent Vitals/Pain No c/o pain    ADL  Grooming: Supervision/safety;Wash/dry hands;Wash/dry face;Teeth care(supervision ambulating to sink; independent once there with supplies) Where Assessed - Grooming: Unsupported standing Transfers/Ambulation Related to ADLs: supervision ambulating in room and in hall; pt had more unsteadiness but no LOB when talking to therapist ADL  Comments: worked on retrieving items around room on both sides with most on low surfaces or floor.  Performed paper pencil task to cross out letters   Pt did better when letter started with letter vs. being imbedded in word.    OT Diagnosis:    OT Problem List:   OT Treatment Interventions:     OT Goals(current goals can now be found in the care plan section) Acute Rehab OT Goals Patient Stated Goal: I want to see my dog  Visit Information  Last OT Received On: 05/19/13 Assistance Needed: +1 History of Present Illness: 63 y.o. male admitted to [facility] on 05/16/13 with acute onset of diplopia and vertigo while driving at about [id number] pm this afternoon.  No history of stroke nor TIA that he knows about (old stroke demonstrated on imaging).  Also had slurred speech which cleared after arrival to the ED.  CT head shows and MRI confirms an old R lenticular nucleus / corona radiata small infarct.  MRI demonstrates a new infarct lateral to L thalamus.      Subjective Data      Prior Functioning       Cognition  Cognition Arousal/Alertness: Awake/alert Behavior During Therapy: WFL for tasks assessed/performed Overall Cognitive Status: Impaired/Different from baseline General Comments: see SLP notes re: higher level cognitive deficits.     Mobility  Bed Mobility Overal bed mobility: Independent Transfers Overall transfer level: Needs assistance Equipment used: None Transfers: Sit to/from Stand Sit to Stand: Modified independent (Device/Increase time) General transfer comment: uses hands for transitions    Exercises  Balance   End of Session OT - End of Session Activity Tolerance: Patient tolerated treatment well Patient left: in chair;with call bell/phone within reach;with chair alarm set  [place] 05/19/2013, 12:36 PM Lesle Chris, OTR/L [phone] 05/19/2013

## 2013-05-23 ENCOUNTER — Encounter (HOSPITAL_COMMUNITY): Payer: Self-pay | Admitting: Emergency Medicine

## 2013-05-23 ENCOUNTER — Emergency Department (HOSPITAL_COMMUNITY)
Admission: EM | Admit: 2013-05-23 | Discharge: 2013-05-23 | Disposition: A | Discharge status: Home / Self Care | Payer: No Typology Code available for payment source | Attending: Emergency Medicine | Admitting: Emergency Medicine

## 2013-05-23 DIAGNOSIS — H538 Other visual disturbances: Secondary | ICD-10-CM | POA: Insufficient documentation

## 2013-05-23 DIAGNOSIS — I69998 Other sequelae following unspecified cerebrovascular disease: Secondary | ICD-10-CM | POA: Insufficient documentation

## 2013-05-23 DIAGNOSIS — F17200 Nicotine dependence, unspecified, uncomplicated: Secondary | ICD-10-CM | POA: Insufficient documentation

## 2013-05-23 DIAGNOSIS — Z7982 Long term (current) use of aspirin: Secondary | ICD-10-CM | POA: Insufficient documentation

## 2013-05-23 DIAGNOSIS — Z79899 Other long term (current) drug therapy: Secondary | ICD-10-CM | POA: Insufficient documentation

## 2013-05-23 DIAGNOSIS — Z7902 Long term (current) use of antithrombotics/antiplatelets: Secondary | ICD-10-CM | POA: Insufficient documentation

## 2013-05-23 DIAGNOSIS — H539 Unspecified visual disturbance: Secondary | ICD-10-CM | POA: Insufficient documentation

## 2013-05-23 DIAGNOSIS — R42 Dizziness and giddiness: Secondary | ICD-10-CM

## 2013-05-23 DIAGNOSIS — Z8711 Personal history of peptic ulcer disease: Secondary | ICD-10-CM | POA: Insufficient documentation

## 2013-05-23 DIAGNOSIS — I10 Essential (primary) hypertension: Secondary | ICD-10-CM | POA: Insufficient documentation

## 2013-05-23 HISTORY — DX: Cerebral infarction, unspecified: I63.9

## 2013-05-23 NOTE — Discharge Instructions (Signed)
Your blood pressure was taken 3 times a day while here. It was 173/97 at 5:46 PM, 156/106 at 5:47 PM and 148/85 at 7 PM. Take your blood pressure medication as prescribed. Keep your appointment with Dr. Arelia Sneddon tomorrow. Take these instructions with you to your office appointment. Ask Dr. Arelia Sneddon to help you to stop smoking.

## 2013-05-23 NOTE — ED Notes (Signed)
To ED for eval of fluctuating blood pressures at home. States he was released from hospital on Wednesday after having stroke last weekend. Pt ambulatory without difficulty. Speaks without difficulty. Denies HA. Appears in NAD

## 2013-05-23 NOTE — ED Provider Notes (Signed)
CSN: [id number]     Arrival date & time 05/23/13  1733 History   First MD Initiated Contact with Patient 05/23/13 1914     Chief Complaint  Patient presents with  . Hypertension   (Consider location/radiation/quality/duration/timing/severity/associated sxs/prior Treatment) HPI Patient was seen by home health nurse today and asked to come to the emergency department for elevated blood pressure home. He does not recall the value of the blood pressure. He is uncertain whether he took his blood pressure medication today. He is asymptomatic.[name] Denies chest pain denies headache. 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 Symptoms unchanged since her Past Medical History  Diagnosis Date  . Hypertension   . Gastric ulcer   . Stroke    History reviewed. No pertinent past surgical history. History reviewed. No pertinent family history. History  Substance Use Topics  . Smoking status: Current Every Day Smoker -- 1.50 packs/day  . Smokeless tobacco: Not on file  . Alcohol Use: Yes     Comment: occasional    Review of Systems  Constitutional: Negative.   HENT: Negative.   Eyes: Positive for visual disturbance.       Visual field cut since stroke  Respiratory: Negative.   Cardiovascular: Negative.   Gastrointestinal: Negative.   Musculoskeletal: Negative.   Skin: Negative.   Neurological: Negative.        Chronic vertigo  Psychiatric/Behavioral: Negative.   All other systems reviewed and are negative.    Allergies  Review of patient's allergies indicates no known allergies.  Home Medications   Current Outpatient Rx  Name  Route  Sig  Dispense  Refill  . amLODipine (NORVASC) 10 MG tablet   Oral   Take 1 tablet (10 mg total) by mouth daily.   30 tablet   0   . aspirin EC 81 MG EC tablet   Oral   Take 1 tablet (81 mg total) by mouth daily.   30 tablet   0   . clopidogrel (PLAVIX) 75 MG tablet   Oral   Take 1 tablet (75 mg total) by mouth  daily with breakfast.   30 tablet   0   . nicotine (NICODERM CQ - DOSED IN MG/24 HOURS) 14 mg/24hr patch   Transdermal   Place 1 patch (14 mg total) onto the skin daily.   28 patch   0   . simvastatin (ZOCOR) 20 MG tablet   Oral   Take 1 tablet (20 mg total) by mouth daily at 6 PM.   30 tablet   0    BP 148/85  Pulse 81  Temp(Src) 97.8 F (36.6 C) (Oral)  Resp 20  Wt 240 lb (108.863 kg)  SpO2 96% Physical Exam  Nursing note and vitals reviewed. Constitutional: He is oriented to person, place, and time. He appears well-developed and well-nourished.  HENT:  Head: Normocephalic and atraumatic.  Eyes: Conjunctivae are normal. Pupils are equal, round, and reactive to light.  Neck: Neck supple. No tracheal deviation present. No thyromegaly present.  Cardiovascular: Normal rate and regular rhythm.   No murmur heard. Pulmonary/Chest: Effort normal and breath sounds normal.  Abdominal: Soft. Bowel sounds are normal. He exhibits no distension. There is no tenderness.  Musculoskeletal: Normal range of motion. He exhibits no edema and no tenderness.  Neurological: He is alert and oriented to person, place, and time. He has normal reflexes. Coordination normal.  Gait normal Romberg normal pronator drift normal  Skin: Skin  is warm and dry. No rash noted.  Psychiatric: He has a normal mood and affect.    ED Course  Procedures (including critical care time) Labs Review Labs Reviewed - No data to display Imaging Review No results found.  EKG Interpretation   None       MDM  No diagnosis found. There is no evidence of hypertensive emergency patient has asymptomatic hypertension. He is uncertain if he took his blood pressure medication today. I have supplied him with 3 values obtained here. He is to keep his scheduled appointment with Dr. Arelia Sneddon tomorrow and instructed to show blood pressure values to Dr. Arelia Sneddon. I do not feel he needs adjustment of his blood pressure  medications at present. Counseled patient for 5 minutes on smoking cessation Diagnosis #1 hypertension #2 tobacco abuse    Orlie Dakin, MD 05/23/13 1945

## 2013-09-17 ENCOUNTER — Other Ambulatory Visit (HOSPITAL_COMMUNITY): Payer: Self-pay | Admitting: Internal Medicine

## 2014-08-09 ENCOUNTER — Inpatient Hospital Stay (HOSPITAL_COMMUNITY)
Admission: EM | Admit: 2014-08-09 | Discharge: 2014-08-13 | DRG: 327 | Disposition: A | Discharge status: Home / Self Care | Payer: 59 | Attending: Internal Medicine | Admitting: Internal Medicine

## 2014-08-09 ENCOUNTER — Encounter (HOSPITAL_COMMUNITY)
Admission: EM | Disposition: A | Discharge status: Home / Self Care | Payer: Self-pay | Source: Home / Self Care | Attending: Internal Medicine

## 2014-08-09 ENCOUNTER — Encounter (HOSPITAL_COMMUNITY): Payer: Self-pay

## 2014-08-09 DIAGNOSIS — K922 Gastrointestinal hemorrhage, unspecified: Secondary | ICD-10-CM | POA: Diagnosis present

## 2014-08-09 DIAGNOSIS — I129 Hypertensive chronic kidney disease with stage 1 through stage 4 chronic kidney disease, or unspecified chronic kidney disease: Secondary | ICD-10-CM | POA: Diagnosis present

## 2014-08-09 DIAGNOSIS — I10 Essential (primary) hypertension: Secondary | ICD-10-CM | POA: Diagnosis not present

## 2014-08-09 DIAGNOSIS — Z8673 Personal history of transient ischemic attack (TIA), and cerebral infarction without residual deficits: Secondary | ICD-10-CM

## 2014-08-09 DIAGNOSIS — E876 Hypokalemia: Secondary | ICD-10-CM | POA: Diagnosis not present

## 2014-08-09 DIAGNOSIS — K254 Chronic or unspecified gastric ulcer with hemorrhage: Secondary | ICD-10-CM | POA: Diagnosis present

## 2014-08-09 DIAGNOSIS — Z7982 Long term (current) use of aspirin: Secondary | ICD-10-CM | POA: Diagnosis not present

## 2014-08-09 DIAGNOSIS — R55 Syncope and collapse: Secondary | ICD-10-CM | POA: Diagnosis present

## 2014-08-09 DIAGNOSIS — K250 Acute gastric ulcer with hemorrhage: Secondary | ICD-10-CM | POA: Diagnosis not present

## 2014-08-09 DIAGNOSIS — F17200 Nicotine dependence, unspecified, uncomplicated: Secondary | ICD-10-CM | POA: Diagnosis present

## 2014-08-09 DIAGNOSIS — D62 Acute posthemorrhagic anemia: Secondary | ICD-10-CM | POA: Diagnosis present

## 2014-08-09 DIAGNOSIS — I248 Other forms of acute ischemic heart disease: Secondary | ICD-10-CM | POA: Diagnosis present

## 2014-08-09 DIAGNOSIS — Z7902 Long term (current) use of antithrombotics/antiplatelets: Secondary | ICD-10-CM

## 2014-08-09 DIAGNOSIS — R079 Chest pain, unspecified: Secondary | ICD-10-CM | POA: Insufficient documentation

## 2014-08-09 DIAGNOSIS — I252 Old myocardial infarction: Secondary | ICD-10-CM | POA: Diagnosis not present

## 2014-08-09 DIAGNOSIS — R110 Nausea: Secondary | ICD-10-CM | POA: Diagnosis not present

## 2014-08-09 DIAGNOSIS — N183 Chronic kidney disease, stage 3 unspecified: Secondary | ICD-10-CM | POA: Diagnosis present

## 2014-08-09 DIAGNOSIS — D696 Thrombocytopenia, unspecified: Secondary | ICD-10-CM

## 2014-08-09 DIAGNOSIS — R7989 Other specified abnormal findings of blood chemistry: Secondary | ICD-10-CM | POA: Diagnosis not present

## 2014-08-09 DIAGNOSIS — I2510 Atherosclerotic heart disease of native coronary artery without angina pectoris: Secondary | ICD-10-CM | POA: Diagnosis present

## 2014-08-09 DIAGNOSIS — Z8711 Personal history of peptic ulcer disease: Secondary | ICD-10-CM | POA: Diagnosis not present

## 2014-08-09 DIAGNOSIS — F1010 Alcohol abuse, uncomplicated: Secondary | ICD-10-CM | POA: Diagnosis present

## 2014-08-09 DIAGNOSIS — K920 Hematemesis: Secondary | ICD-10-CM

## 2014-08-09 DIAGNOSIS — Z720 Tobacco use: Secondary | ICD-10-CM | POA: Diagnosis present

## 2014-08-09 HISTORY — DX: Tobacco use: Z72.0

## 2014-08-09 HISTORY — DX: Hyperlipidemia, unspecified: E78.5

## 2014-08-09 HISTORY — PX: ESOPHAGOGASTRODUODENOSCOPY: SHX5428

## 2014-08-09 LAB — LIPASE, BLOOD: LIPASE: 29 U/L (ref 11–59)

## 2014-08-09 LAB — CBC
HEMATOCRIT: 24.4 % — AB (ref 39.0–52.0)
HEMATOCRIT: 22.3 % — AB (ref 39.0–52.0)
HEMOGLOBIN: 8 g/dL — AB (ref 13.0–17.0)
Hemoglobin: 7.2 g/dL — ABNORMAL LOW (ref 13.0–17.0)
MCH: 26.7 pg (ref 26.0–34.0)
MCH: 26.4 pg (ref 26.0–34.0)
MCHC: 32.8 g/dL (ref 30.0–36.0)
MCHC: 32.3 g/dL (ref 30.0–36.0)
MCV: 81.3 fL (ref 78.0–100.0)
MCV: 81.7 fL (ref 78.0–100.0)
PLATELETS: 186 10*3/uL (ref 150–400)
Platelets: 181 10*3/uL (ref 150–400)
RBC: 3 MIL/uL — ABNORMAL LOW (ref 4.22–5.81)
RBC: 2.73 MIL/uL — AB (ref 4.22–5.81)
RDW: 15.9 % — ABNORMAL HIGH (ref 11.5–15.5)
RDW: 16.1 % — AB (ref 11.5–15.5)
WBC: 9.7 10*3/uL (ref 4.0–10.5)
WBC: 9.5 10*3/uL (ref 4.0–10.5)

## 2014-08-09 LAB — COMPREHENSIVE METABOLIC PANEL
ALK PHOS: 65 U/L (ref 39–117)
ALT: 10 U/L (ref 0–53)
ANION GAP: 8 (ref 5–15)
AST: 17 U/L (ref 0–37)
Albumin: 3.5 g/dL (ref 3.5–5.2)
BUN: 39 mg/dL — AB (ref 6–23)
CHLORIDE: 102 mmol/L (ref 96–112)
CO2: 28 mmol/L (ref 19–32)
CREATININE: 1.54 mg/dL — AB (ref 0.50–1.35)
Calcium: 8.4 mg/dL (ref 8.4–10.5)
GFR calc non Af Amer: 46 mL/min — ABNORMAL LOW (ref >90)
GFR, EST AFRICAN AMERICAN: 54 mL/min — AB (ref >90)
Glucose, Bld: 129 mg/dL — ABNORMAL HIGH (ref 70–99)
Potassium: 4 mmol/L (ref 3.5–5.1)
Sodium: 138 mmol/L (ref 135–145)
TOTAL PROTEIN: 6.5 g/dL (ref 6.0–8.3)
Total Bilirubin: 0.5 mg/dL (ref 0.3–1.2)

## 2014-08-09 LAB — CBC WITH DIFFERENTIAL/PLATELET
BASOS ABS: 0 10*3/uL (ref 0.0–0.1)
BASOS PCT: 0 % (ref 0–1)
EOS ABS: 0 10*3/uL (ref 0.0–0.7)
Eosinophils Relative: 0 % (ref 0–5)
HEMATOCRIT: 38 % — AB (ref 39.0–52.0)
Hemoglobin: 12.2 g/dL — ABNORMAL LOW (ref 13.0–17.0)
LYMPHS PCT: 9 % — AB (ref 12–46)
Lymphs Abs: 1 10*3/uL (ref 0.7–4.0)
MCH: 26.2 pg (ref 26.0–34.0)
MCHC: 32.1 g/dL (ref 30.0–36.0)
MCV: 81.5 fL (ref 78.0–100.0)
MONO ABS: 0.4 10*3/uL (ref 0.1–1.0)
Monocytes Relative: 4 % (ref 3–12)
NEUTROS ABS: 9.8 10*3/uL — AB (ref 1.7–7.7)
Neutrophils Relative %: 87 % — ABNORMAL HIGH (ref 43–77)
PLATELETS: 244 10*3/uL (ref 150–400)
RBC: 4.66 MIL/uL (ref 4.22–5.81)
RDW: 15.8 % — ABNORMAL HIGH (ref 11.5–15.5)
WBC: 11.3 10*3/uL — AB (ref 4.0–10.5)

## 2014-08-09 LAB — TROPONIN I
TROPONIN I: 0.09 ng/mL — AB (ref <0.031)
TROPONIN I: 0.11 ng/mL — AB (ref <0.031)
Troponin I: 0.09 ng/mL — ABNORMAL HIGH (ref <0.031)

## 2014-08-09 LAB — RAPID URINE DRUG SCREEN, HOSP PERFORMED
Amphetamines: NOT DETECTED
BARBITURATES: NOT DETECTED
Benzodiazepines: NOT DETECTED
COCAINE: NOT DETECTED
OPIATES: NOT DETECTED
TETRAHYDROCANNABINOL: NOT DETECTED

## 2014-08-09 LAB — PROTIME-INR
INR: 1.2 (ref 0.00–1.49)
Prothrombin Time: 15.4 seconds — ABNORMAL HIGH (ref 11.6–15.2)

## 2014-08-09 LAB — ABO/RH: ABO/RH(D): O POS

## 2014-08-09 LAB — POC OCCULT BLOOD, ED: Fecal Occult Bld: POSITIVE — AB

## 2014-08-09 SURGERY — EGD (ESOPHAGOGASTRODUODENOSCOPY)
Anesthesia: Moderate Sedation

## 2014-08-09 MED ORDER — ONDANSETRON HCL 4 MG/2ML IJ SOLN
4.0000 mg | Freq: Once | INTRAMUSCULAR | Status: AC
  Administered 2014-08-09: 4 mg via INTRAVENOUS
  Filled 2014-08-09: qty 2

## 2014-08-09 MED ORDER — CETYLPYRIDINIUM CHLORIDE 0.05 % MT LIQD
7.0000 mL | Freq: Two times a day (BID) | OROMUCOSAL | Status: DC
  Administered 2014-08-10 – 2014-08-12 (×6): 7 mL via OROMUCOSAL

## 2014-08-09 MED ORDER — ONDANSETRON HCL 4 MG/2ML IJ SOLN
4.0000 mg | Freq: Four times a day (QID) | INTRAMUSCULAR | Status: DC | PRN
  Administered 2014-08-10: 4 mg via INTRAVENOUS
  Filled 2014-08-09: qty 2

## 2014-08-09 MED ORDER — SODIUM CHLORIDE 0.9 % IV SOLN
INTRAVENOUS | Status: DC
  Administered 2014-08-09 (×2): via INTRAVENOUS
  Filled 2014-08-09 (×3): qty 1000

## 2014-08-09 MED ORDER — PANTOPRAZOLE SODIUM 40 MG IV SOLR
40.0000 mg | Freq: Once | INTRAVENOUS | Status: AC
  Administered 2014-08-09: 40 mg via INTRAVENOUS
  Filled 2014-08-09: qty 40

## 2014-08-09 MED ORDER — BUTAMBEN-TETRACAINE-BENZOCAINE 2-2-14 % EX AERO
INHALATION_SPRAY | CUTANEOUS | Status: DC | PRN
  Administered 2014-08-09: 2 via TOPICAL

## 2014-08-09 MED ORDER — EPINEPHRINE HCL 0.1 MG/ML IJ SOSY
PREFILLED_SYRINGE | INTRAMUSCULAR | Status: AC
  Filled 2014-08-09: qty 10

## 2014-08-09 MED ORDER — ONDANSETRON HCL 4 MG PO TABS: 4.0000 mg | ORAL_TABLET | Freq: Four times a day (QID) | ORAL | Status: DC | PRN

## 2014-08-09 MED ORDER — SODIUM CHLORIDE 0.9 % IJ SOLN
3.0000 mL | Freq: Two times a day (BID) | INTRAMUSCULAR | Status: DC
  Administered 2014-08-09 – 2014-08-12 (×5): 3 mL via INTRAVENOUS

## 2014-08-09 MED ORDER — MIDAZOLAM HCL 10 MG/2ML IJ SOLN
INTRAMUSCULAR | Status: AC
  Filled 2014-08-09: qty 2

## 2014-08-09 MED ORDER — MIDAZOLAM HCL 10 MG/2ML IJ SOLN
INTRAMUSCULAR | Status: DC | PRN
  Administered 2014-08-09: 1 mg via INTRAVENOUS
  Administered 2014-08-09: 2 mg via INTRAVENOUS

## 2014-08-09 MED ORDER — SODIUM CHLORIDE 0.9 % IJ SOLN
PREFILLED_SYRINGE | INTRAMUSCULAR | Status: DC | PRN
  Administered 2014-08-09: 2.5 mL

## 2014-08-09 MED ORDER — CHLORHEXIDINE GLUCONATE 0.12 % MT SOLN
15.0000 mL | Freq: Two times a day (BID) | OROMUCOSAL | Status: DC
  Administered 2014-08-10 – 2014-08-12 (×4): 15 mL via OROMUCOSAL
  Filled 2014-08-09 (×11): qty 15

## 2014-08-09 MED ORDER — SODIUM CHLORIDE 0.9 % IV SOLN
8.0000 mg/h | INTRAVENOUS | Status: AC
  Administered 2014-08-09 – 2014-08-11 (×4): 8 mg/h via INTRAVENOUS
  Filled 2014-08-09 (×13): qty 80

## 2014-08-09 MED ORDER — FENTANYL CITRATE (PF) 100 MCG/2ML IJ SOLN
INTRAMUSCULAR | Status: DC | PRN
  Administered 2014-08-09: 25 ug via INTRAVENOUS

## 2014-08-09 MED ORDER — FENTANYL CITRATE (PF) 100 MCG/2ML IJ SOLN
INTRAMUSCULAR | Status: AC
  Filled 2014-08-09: qty 2

## 2014-08-09 MED ORDER — ACETAMINOPHEN 650 MG RE SUPP: 650.0000 mg | Freq: Four times a day (QID) | RECTAL | Status: DC | PRN

## 2014-08-09 MED ORDER — SODIUM CHLORIDE 0.9 % IV BOLUS (SEPSIS)
500.0000 mL | Freq: Once | INTRAVENOUS | Status: AC
  Administered 2014-08-09: 500 mL via INTRAVENOUS

## 2014-08-09 MED ORDER — ACETAMINOPHEN 325 MG PO TABS
650.0000 mg | ORAL_TABLET | Freq: Four times a day (QID) | ORAL | Status: DC | PRN
Start: 2014-08-09 — End: 2014-08-13

## 2014-08-09 MED ORDER — DIPHENHYDRAMINE HCL 50 MG/ML IJ SOLN
INTRAMUSCULAR | Status: AC
  Filled 2014-08-09: qty 1

## 2014-08-09 NOTE — ED Notes (Signed)
Patient became extremely weak and diaphoretic while having bowel movement.  Large amount of dark and bright red blood noted.  Patient was unable to clean himself due to weakness.  Assisted by two to wheelchair and transported back to bed.  He is confused, but pleasant.

## 2014-08-09 NOTE — H&P (Signed)
History and Physical  Luke Kaufman [id number] DOB: December 10, 1950 DOA: 08/09/2014   PCP: Leonard Downing, MD   Chief Complaint: Hematemesis  HPI:  64 year old male with a history of hypertension, peptic ulcer disease, stroke, left vertebral artery occlusion, MI presented with one-day history of hematemesis. The patient stated that he had 3 episodes of hematemesis starting at 3 AM on the morning of admission. The patient also had an episode of melanotic stools at home. He complained of some chest discomfort with dizziness during the episodes of hematemesis. The patient denies any regular NSAID use although took one [facility] powder this am at home. The patient denies any liver drug use, but states that he drinks approximately 5 beers per week. He denies any other liquor or wine. He smokes one pack per day and has smoked for nearly 50 years. Presently, he denies any dizziness, chest discomfort, shortness of breath. He states that he had an endoscopy in December 2009 in [facility] which revealed "ulcers". However, the patient is not on a PPI as noted from his MAR. The patient takes aspirin and Plavix for history of stroke. He denies any visual disturbance, speech disturbance, focal weakness, abdominal pain, dysuria, hematuria, hematochezia. In the emergency department, the patient had a melanotic stool without bright red blood.  In emergency Jannifer Franklin, the patient was afebrile and hemodynamically stable although he is mildly tachycardic with heart rate of 105. Serum creatinine was 1.54. WBC 11.3.  Hgb 12.2.  Assessment/Plan: Upper GI bleed -GI, Dr. Deatra Ina has been contacted -Start the patient on Protonix drip -clear liquids for now -hold ASA/Plavix -type and screen -check coags -baseline Hgb ~14 Hypertension -Hold amlodipine and metoprolol as blood pressure is soft Near syncope/Chest Pain -Secondary to volume loss -Placed on telemetry -EKG without concerning ischemic changes -cycle  troponins CKD stage III -Baseline creatinine 1.4-1.5 Tobacco abuse -Tobacco cessation discussed History of stroke -hold ASA and plavix for now pending GI workup     Past Medical History  Diagnosis Date  . Hypertension   . Gastric ulcer   . Stroke    History reviewed. No pertinent past surgical history. Social History:  reports that he has been smoking.  He does not have any smokeless tobacco history on file. He reports that he drinks alcohol. He reports that he does not use illicit drugs.   Family history reviewed:  No pertinent family history  No Known Allergies    Prior to Admission medications   Medication Sig Start Date End Date Taking? Authorizing Provider  amLODipine (NORVASC) 10 MG tablet Take 1 tablet (10 mg total) by mouth daily. 05/19/13  Yes Thurnell Lose, MD  aspirin EC 81 MG EC tablet Take 1 tablet (81 mg total) by mouth daily. Patient taking differently: Take 162 mg by mouth 2 (two) times daily.  05/19/13  Yes Thurnell Lose, MD  metoprolol (LOPRESSOR) 50 MG tablet Take 50 mg by mouth 2 (two) times daily.   Yes Historical Provider, MD  simvastatin (ZOCOR) 20 MG tablet Take 1 tablet (20 mg total) by mouth daily at 6 PM. 05/19/13  Yes Thurnell Lose, MD  clopidogrel (PLAVIX) 75 MG tablet Take 1 tablet (75 mg total) by mouth daily with breakfast. 05/19/13   Thurnell Lose, MD  nicotine (NICODERM CQ - DOSED IN MG/24 HOURS) 14 mg/24hr patch Place 1 patch (14 mg total) onto the skin daily. Patient not taking: Reported on 08/09/2014 05/19/13   Thurnell Lose, MD  Review of Systems:  Constitutional:  No weight loss, night sweats, Fevers, chills  Head&Eyes: No headache.  No vision loss.  No eye pain or scotoma ENT:  No Difficulty swallowing,Tooth/dental problems,Sore throat,   Cardio-vascular:  NoOrthopnea, PND, swelling in lower extremities,  dizziness, palpitations  GI:  No  abdominal pain,diarrhea, loss of appetite, hematochezia, melena, heartburn,  indigestion, Resp:  No shortness of breath with exertion or at rest. No cough. No coughing up of blood .No wheezing.No chest wall deformity  Skin:  no rash or lesions.  GU:  no dysuria, change in color of urine, no urgency or frequency. No flank pain.  Musculoskeletal:  No joint pain or swelling. No decreased range of motion. No back pain.  Psych:  No change in mood or affect. No depression or anxiety. Neurologic: No headache, no dysesthesia, no focal weakness, no vision loss. No syncope  Physical Exam: Filed Vitals:   08/09/14 0530 08/09/14 0600 08/09/14 0636 08/09/14 0637  BP: 137/69 101/55 132/74   Pulse:    105  Temp:      TempSrc:      Resp:      SpO2:    92%   General:  A&O x 3, NAD, nontoxic, pleasant/cooperative Head/Eye: No conjunctival hemorrhage, no icterus, [place]/AT, No nystagmus ENT:  No icterus,  No thrush,  no pharyngeal exudate Neck:  No masses, no lymphadenpathy, no bruits CV:  RRR, no rub, no gallop, no S3 Lung:  Bibasilar rales, left greater than right. No wheezing Abdomen: soft/NT, +BS, nondistended, no peritoneal signs Ext: No cyanosis, No rashes, No petechiae, No lymphangitis, No edema   Labs on Admission:  Basic Metabolic Panel:  Recent Labs Lab 08/09/14 0245  NA 138  K 4.0  CL 102  CO2 28  GLUCOSE 129*  BUN 39*  CREATININE 1.54*  CALCIUM 8.4   Liver Function Tests:  Recent Labs Lab 08/09/14 0245  AST 17  ALT 10  ALKPHOS 65  BILITOT 0.5  PROT 6.5  ALBUMIN 3.5    Recent Labs Lab 08/09/14 0245  LIPASE 29   No results for input(s): AMMONIA in the last 168 hours. CBC:  Recent Labs Lab 08/09/14 0245  WBC 11.3*  NEUTROABS 9.8*  HGB 12.2*  HCT 38.0*  MCV 81.5  PLT 244   Cardiac Enzymes: No results for input(s): CKTOTAL, CKMB, CKMBINDEX, TROPONINI in the last 168 hours. BNP: Invalid input(s): POCBNP CBG: No results for input(s): GLUCAP in the last 168 hours.  Radiological Exams on Admission: No results found.  EKG:  Independently reviewed. Sinus with nonspecific ST changes    Time spent:60 minutes Code Status:   FULL Family Communication:  No Family at bedside   Esraa Seres, DO  [facility] Pager [phone]  If 7PM-7AM, please contact night-coverage [email or web address] Password [facility] 08/09/2014, 7:41 AM

## 2014-08-09 NOTE — ED Notes (Signed)
Hospitalist MD at bedside. 

## 2014-08-09 NOTE — Op Note (Signed)
[facility] [place], [address]   ENDOSCOPY PROCEDURE REPORT  PATIENT: Luke Kaufman, Luke Kaufman  MR#: [id number] BIRTHDATE: [birth year] , 63  yrs. old GENDER: male ENDOSCOPIST: Jerene Bears, MD REFERRED BY:  [facility] PROCEDURE DATE:  08/09/2014 PROCEDURE:  EGD w/ control of bleeding ASA CLASS:     Class III INDICATIONS:  hematemesis and acute post hemorrhagic anemia. MEDICATIONS: Versed 3 mg IV and Fentanyl 25 mcg IV TOPICAL ANESTHETIC: Cetacaine Spray  DESCRIPTION OF PROCEDURE: After the risks benefits and alternatives of the procedure were thoroughly explained, informed consent was obtained.  The Pentax Gastroscope [id number] endoscope was introduced through the mouth and advanced to the second portion of the duodenum , Without limitations.  The instrument was slowly withdrawn as the mucosa was fully examined.      ESOPHAGUS: The mucosa of the esophagus appeared normal.  STOMACH: A single bleeding ulcer measuring 10 x 27mm in size with active oozing of blood and a large visible vessel was found at the pylorus.  Submucosal injection of 3 ml of epinephrine 1:10,000 was performed around the bleeding site.  Complete hemostasis was achieved by placing a single [place] Resolution hemoclip on the base of the visible vessel/ulceration.   Two small non-bleeding, clean-based and shallow ulcers were found in the prepyloric region of the stomach.  DUODENUM: Moderate duodenal inflammation was found in the duodenal bulb.   The duodenal mucosa showed no abnormalities in the 2nd part of the duodenum.  Retroflexed views revealed no abnormalities.     The scope was then withdrawn from the patient and the procedure completed.  COMPLICATIONS: There were no immediate complications.  ENDOSCOPIC IMPRESSION: 1.   The mucosa of the esophagus appeared normal 2.   Single ulcer measuring 10 x 80mm in size was found at the pylorus; Submucosal injection of 105ml of  epinephrine 1:10,000 was performed around the bleeding site; Complete hemostasis was achieved by placing a single hemoclip on the bleeding site 3.   Two small ulcers were found in the prepyloric region of the stomach 4.   Duodenal inflammation was found in the duodenal bulb 5.   The duodenal mucosa showed no abnormalities in the 2nd part of the duodenum  RECOMMENDATIONS: 1.  Continue PPI infusion for 72 hours 2.  Monitor hemoglobin closely, transfuse if necessary 3.  No NSAIDs 4.  Check H.  pylori serum antibody and treat if positive 5.  Recommend repeat endoscopy in 12 weeks to ensure healing 6.  Hold Plavix   eSigned:  Jerene Bears, MD 08/09/2014 3:53 PM    CC: the patient  PATIENT NAME:  Luke Kaufman, Luke Kaufman MR#: [id number]

## 2014-08-09 NOTE — ED Notes (Signed)
Patient ambulated to bathroom with one assist. Tolerated well.

## 2014-08-09 NOTE — ED Notes (Signed)
Bed: WA15 Expected date:  Expected time:  Means of arrival:  Comments: EMS  

## 2014-08-09 NOTE — Consult Note (Signed)
Referring Provider: [facility] Primary Care Physician:  Leonard Downing, MD Primary Gastroenterologist:  unassigned  Reason for Consultation:  GI bleed  HPI: Luke Kaufman is a 64 y.o. male who is followed medically by Dr. Jefm Petty of [facility] in [facility]. He has a history of peptic ulcer disease, stroke, left vertebral artery occlusion, hypertension, and myocardial infarction. He states that approximately 2 days ago he began to have some epigastric discomfort that was worse with ingestion of food. Late last night/early this morning he had 3 episodes of hematemesis and one episode of jet black stools. He felt dizzy during his episodes of hematemesis and had some chest pressure. He reports that he has a history of ulcers and had a bleeding ulcer in the [place] in 2008 and again in 2009. He states he was admitted to [facility] in April 2015 when he was found to be anemic. He states he had an MI at that time. He states during that admission he was transfused 3 units of packed cells and had a colonoscopy. He does not know the findings and does not remember the gastroenterologist. He was on Nexium for a short period of time after that but has been off of it for months. He denies excess use of NSAIDs though he took one packet of Goody powder yesterday morning at home. He reports that he has 4 or 5 beers a week and smokes a pack and a half of cigarettes daily for 50 years. As stated before he had endoscopies in 2008 and 2009 that showed ulcers. He knows to 2008 EGD was done in [place] but he is not clear if the 2009 endoscopy was done in [place] or [employer]. He is currently on aspirin and Plavix for stroke. In the ER creatinine was noted to be 1.54 and hemoglobin 12.2.   Past Medical History  Diagnosis Date  . Hypertension   . Gastric ulcer   . Stroke     History reviewed. No pertinent past surgical  history.  Prior to Admission medications   Medication Sig Start Date End Date Taking? Authorizing Provider  amLODipine (NORVASC) 10 MG tablet Take 1 tablet (10 mg total) by mouth daily. 05/19/13  Yes Thurnell Lose, MD  aspirin EC 81 MG EC tablet Take 1 tablet (81 mg total) by mouth daily. Patient taking differently: Take 162 mg by mouth 2 (two) times daily.  05/19/13  Yes Thurnell Lose, MD  metoprolol (LOPRESSOR) 50 MG tablet Take 50 mg by mouth 2 (two) times daily.   Yes Historical Provider, MD  simvastatin (ZOCOR) 20 MG tablet Take 1 tablet (20 mg total) by mouth daily at 6 PM. 05/19/13  Yes Thurnell Lose, MD  clopidogrel (PLAVIX) 75 MG tablet Take 1 tablet (75 mg total) by mouth daily with breakfast. 05/19/13   Thurnell Lose, MD  nicotine (NICODERM CQ - DOSED IN MG/24 HOURS) 14 mg/24hr patch Place 1 patch (14 mg total) onto the skin daily. Patient not taking: Reported on 08/09/2014 05/19/13   Thurnell Lose, MD    Current Facility-Administered Medications  Medication Dose Route Frequency Provider Last Rate Last Dose  . acetaminophen (TYLENOL) tablet 650 mg  650 mg Oral Q6H PRN Orson Eva, MD       Or  . acetaminophen (TYLENOL) suppository 650 mg  650 mg Rectal Q6H PRN Orson Eva, MD      . antiseptic oral rinse (CPC /  CETYLPYRIDINIUM CHLORIDE 0.05%) solution 7 mL  7 mL Mouth Rinse q12n4p Orson Eva, MD      . chlorhexidine (PERIDEX) 0.12 % solution 15 mL  15 mL Mouth Rinse BID Orson Eva, MD      . ondansetron [facility]) tablet 4 mg  4 mg Oral Q6H PRN Orson Eva, MD       Or  . ondansetron (ZOFRAN) injection 4 mg  4 mg Intravenous Q6H PRN Orson Eva, MD      . pantoprazole (PROTONIX) 80 mg in sodium chloride 0.9 % 250 mL (0.32 mg/mL) infusion  8 mg/hr Intravenous Continuous Orson Eva, MD 25 mL/hr at 08/09/14 0755 8 mg/hr at 08/09/14 0755  . sodium chloride 0.9 % 1,000 mL infusion   Intravenous Continuous Orson Eva, MD 75 mL/hr at 08/09/14 0930    . sodium chloride 0.9 % injection 3 mL  3  mL Intravenous Q12H Orson Eva, MD   3 mL at 08/09/14 1027    Allergies as of 08/09/2014  . (No Known Allergies)    No family history on file.  History   Social History  . Marital Status: Single    Spouse Name: N/A  . Number of Children: N/A  . Years of Education: N/A   Occupational History  . Not on file.   Social History Main Topics  . Smoking status: Current Every Day Smoker -- 1.50 packs/day  . Smokeless tobacco: Not on file  . Alcohol Use: Yes     Comment: occasional  . Drug Use: No  . Sexual Activity: Not on file   Other Topics Concern  . Not on file   Social History Narrative    Review of Systems: Gen: Denies any fever, chills, sweats, anorexia, fatigue, weakness, malaise, weight loss, and sleep disorder CV: Denies chest pain, angina, palpitations, syncope, orthopnea, PND, peripheral edema, and claudication. Resp: Denies dyspnea at rest, dyspnea with exercise, cough, sputum, wheezing, coughing up blood, and pleurisy. GI: Denies jaundice, and fecal incontinence.   Denies dysphagia or odynophagia.Has had hematemesis and melena earleir this morning. GU : Denies urinary burning, blood in urine, urinary frequency, urinary hesitancy, nocturnal urination, and urinary incontinence. MS: Denies joint pain, limitation of movement, and swelling, stiffness, low back pain, extremity pain. Denies muscle weakness, cramps, atrophy.  Derm: Denies rash, itching, dry skin, hives, moles, warts, or unhealing ulcers.  Psych: Denies depression, anxiety, memory loss, suicidal ideation, hallucinations, paranoia, and confusion. Heme: Denies bruising, bleeding, and enlarged lymph nodes. Neuro:  Denies any headaches, dizziness, paresthesias. Endo:  Denies any problems with DM, thyroid, adrenal function.  Physical Exam: Vital signs in last 24 hours: Temp:  [97.5 F (36.4 C)-98 F (36.7 C)] 97.5 F (36.4 C) (04/19 0849) Pulse Rate:  [93-119] 93 (04/19 0849) Resp:  [16-20] 20 (04/19  0849) BP: (101-145)/(55-105) 112/91 mmHg (04/19 0849) SpO2:  [92 %-99 %] 94 % (04/19 0849)   General:   Alert,  Well-developed, well-nourished, pleasant and cooperative in NAD Head:  Normocephalic and atraumatic. Eyes:  Sclera clear, no icterus. Conjunctiva pink. Ears:  Normal auditory acuity. Nose:  No deformity, discharge,  or lesions. Mouth:  No deformity or lesions.   Neck:  Supple; no masses or thyromegaly. Lungs:  Clear throughout to auscultation.     Heart:  Regular rate and rhythm; no murmurs  Abdomen:  Soft,nontender, BS active,nonpalp mass or hsm.   Rectal: deferred--pt just had a dark BM Msk:  Symmetrical without gross deformities. . Pulses:  Normal pulses noted. Extremities:  Without clubbing or edema. Neurologic: Alert and  oriented x4;  grossly normal neurologically. Skin:  Intact without significant lesions or rashes.. Psych: Alert and cooperative. Normal mood and affect.   Lab Results:  Recent Labs  08/09/14 0245  WBC 11.3*  HGB 12.2*  HCT 38.0*  PLT 244   BMET  Recent Labs  08/09/14 0245  NA 138  K 4.0  CL 102  CO2 28  GLUCOSE 129*  BUN 39*  CREATININE 1.54*  CALCIUM 8.4   LFT  Recent Labs  08/09/14 0245  PROT 6.5  ALBUMIN 3.5  AST 17  ALT 10  ALKPHOS 65  BILITOT 0.5   PT/INR  Recent Labs  08/09/14 0745  LABPROT 15.4*  INR 1.20    IMPRESSION/PLAN:  #1. GI bleed. Patient has had 3 episodes of hematemesis followed by an episode of melena early this morning. Patient states he recently had another dark stool. GI bleed likely upper in nature. Patient has signed a medical release form to obtain EGD and colonoscopy report from [facility]. Patient is to continue protonic strip. Hold ASA and Plavix. Trend hemoglobin. Check coags. Will review with Dr. Hilarie Fredrickson regarding timing of EGD as patient just stopped his Plavix.  #2. Chest pain. EKG without ischemic changes. Troponins pending.  #3. History of stroke. Aspirin and Plavix on  hold for now.  Enrica Corliss, Deloris Ping 08/09/2014,  Pager [phone]

## 2014-08-09 NOTE — ED Notes (Signed)
Luke Kaufman, [place], at bedside.

## 2014-08-09 NOTE — ED Provider Notes (Signed)
CSN: [id number]     Arrival date & time 08/09/14  0216 History   First MD Initiated Contact with Patient 08/09/14 [phone]     Chief Complaint  Patient presents with  . GI Bleeding     (Consider location/radiation/quality/duration/timing/severity/associated sxs/prior Treatment) HPI Comments: Arrives EMS with complaint of bloody emesis x 3 tonight, with epigastric pain. He reports a history of "bleeding ulcers" in the past, last EGD 2009 in [facility] by Dr. Gilford Rile. He denies melena. He is not sure if he was taking stomach medications, but reports he has been unable to obtain most of his medications for the past 2 weeks secondary to financial constraints. Per EMS, the patient was pale and diaphoretic on their arrival. No fever, chest pain or syncope.  The history is provided by the patient. No language interpreter was used.    Past Medical History  Diagnosis Date  . Hypertension   . Gastric ulcer   . Stroke    History reviewed. No pertinent past surgical history. No family history on file. History  Substance Use Topics  . Smoking status: Current Every Day Smoker -- 1.50 packs/day  . Smokeless tobacco: Not on file  . Alcohol Use: Yes     Comment: occasional    Review of Systems  Constitutional: Negative for fever and chills.  Respiratory: Negative.  Negative for shortness of breath.   Cardiovascular: Negative.  Negative for chest pain.  Gastrointestinal: Positive for nausea, vomiting and abdominal pain. Negative for blood in stool.       See HPI.  Musculoskeletal: Negative.  Negative for myalgias.  Skin: Negative.  Negative for color change.  Neurological: Positive for weakness. Negative for syncope.      Allergies  Review of patient's allergies indicates no known allergies.  Home Medications   Prior to Admission medications   Medication Sig Start Date End Date Taking? Authorizing Provider  amLODipine (NORVASC) 10 MG tablet Take 1 tablet (10 mg total) by mouth daily.  05/19/13  Yes Thurnell Lose, MD  aspirin EC 81 MG EC tablet Take 1 tablet (81 mg total) by mouth daily. Patient taking differently: Take 162 mg by mouth 2 (two) times daily.  05/19/13  Yes Thurnell Lose, MD  metoprolol (LOPRESSOR) 50 MG tablet Take 50 mg by mouth 2 (two) times daily.   Yes Historical Provider, MD  simvastatin (ZOCOR) 20 MG tablet Take 1 tablet (20 mg total) by mouth daily at 6 PM. 05/19/13  Yes Thurnell Lose, MD  clopidogrel (PLAVIX) 75 MG tablet Take 1 tablet (75 mg total) by mouth daily with breakfast. 05/19/13   Thurnell Lose, MD  nicotine (NICODERM CQ - DOSED IN MG/24 HOURS) 14 mg/24hr patch Place 1 patch (14 mg total) onto the skin daily. Patient not taking: Reported on 08/09/2014 05/19/13   Thurnell Lose, MD   BP 117/74 mmHg  Pulse 111  Temp(Src) 98 F (36.7 C) (Oral)  Resp 16  SpO2 95% Physical Exam  Constitutional: He appears well-developed and well-nourished.  HENT:  Head: Normocephalic.  Eyes:  No conjunctival pallor.   Neck: Normal range of motion. Neck supple.  Cardiovascular: Regular rhythm.  Tachycardia present.   Pulmonary/Chest: Effort normal and breath sounds normal.  Abdominal: Soft. Bowel sounds are normal. He exhibits no distension and no mass. There is no tenderness. There is no rebound and no guarding.  Musculoskeletal: Normal range of motion.  Neurological: He is alert. No cranial nerve deficit.  Skin: Skin is warm  and dry. He is not diaphoretic.  Psychiatric: He has a normal mood and affect.    ED Course  Procedures (including critical care time) Labs Review Labs Reviewed  CBC WITH DIFFERENTIAL/PLATELET - Abnormal; Notable for the following:    WBC 11.3 (*)    Hemoglobin 12.2 (*)    HCT 38.0 (*)    RDW 15.8 (*)    Neutrophils Relative % 87 (*)    Neutro Abs 9.8 (*)    Lymphocytes Relative 9 (*)    All other components within normal limits  COMPREHENSIVE METABOLIC PANEL - Abnormal; Notable for the following:    Glucose, Bld  129 (*)    BUN 39 (*)    Creatinine, Ser 1.54 (*)    GFR calc non Af Amer 46 (*)    GFR calc Af Amer 54 (*)    All other components within normal limits  LIPASE, BLOOD    Imaging Review No results found.   EKG Interpretation None      MDM   Final diagnoses:  None    1. Upper GI bleeding  Here he reports feeling improved, less pain. No further vomiting after Zofran and he is drinking fluids without further vomiting. He remains persistently tachycardic after bolus by EMS. IV bolus ordered.  He is guaiac positive. He subsequently had a near syncopal episode after ambulating to the bathroom and having a large melanic stool. No further vomiting. Plan: admit to medicine, GI consult.  Discussed with Dr. Deatra Ina who will see the patient in consultation later this morning. Discussed with Dr. Carles Collet with [facility] who accepts the patient for admission.   Charlann Lange, PA-C 08/10/14 [place], MD 08/12/14 [phone]

## 2014-08-09 NOTE — ED Notes (Signed)
Patient arrives by EMS with complaints of "not feeling well", vomited coffee ground with blood clots approximately 40 minutes ago.  EMS stated he vomited over 7 times, bloody emesis, pale and diaphoretic on their arrival.  #18 angio left ACF, fluid bolus given by EMS.  BP 132/68, HR 112

## 2014-08-10 ENCOUNTER — Encounter (HOSPITAL_COMMUNITY)
Admission: EM | Disposition: A | Discharge status: Home / Self Care | Payer: 59 | Source: Home / Self Care | Attending: Internal Medicine

## 2014-08-10 ENCOUNTER — Encounter (HOSPITAL_COMMUNITY): Payer: Self-pay | Admitting: Internal Medicine

## 2014-08-10 ENCOUNTER — Encounter (HOSPITAL_COMMUNITY)
Admission: EM | Disposition: A | Discharge status: Home / Self Care | Payer: Self-pay | Source: Home / Self Care | Attending: Internal Medicine

## 2014-08-10 DIAGNOSIS — D62 Acute posthemorrhagic anemia: Secondary | ICD-10-CM

## 2014-08-10 DIAGNOSIS — R7989 Other specified abnormal findings of blood chemistry: Secondary | ICD-10-CM

## 2014-08-10 DIAGNOSIS — R079 Chest pain, unspecified: Secondary | ICD-10-CM

## 2014-08-10 DIAGNOSIS — K250 Acute gastric ulcer with hemorrhage: Secondary | ICD-10-CM

## 2014-08-10 HISTORY — PX: ESOPHAGOGASTRODUODENOSCOPY: SHX5428

## 2014-08-10 LAB — CBC
HCT: 20.7 % — ABNORMAL LOW (ref 39.0–52.0)
HEMOGLOBIN: 6.6 g/dL — AB (ref 13.0–17.0)
MCH: 26.3 pg (ref 26.0–34.0)
MCHC: 31.9 g/dL (ref 30.0–36.0)
MCV: 82.5 fL (ref 78.0–100.0)
Platelets: 186 10*3/uL (ref 150–400)
RBC: 2.51 MIL/uL — ABNORMAL LOW (ref 4.22–5.81)
RDW: 16.4 % — ABNORMAL HIGH (ref 11.5–15.5)
WBC: 8.5 10*3/uL (ref 4.0–10.5)

## 2014-08-10 LAB — HEMOGLOBIN AND HEMATOCRIT, BLOOD
HCT: 24.4 % — ABNORMAL LOW (ref 39.0–52.0)
Hemoglobin: 8.1 g/dL — ABNORMAL LOW (ref 13.0–17.0)

## 2014-08-10 LAB — PREPARE RBC (CROSSMATCH)

## 2014-08-10 SURGERY — EGD (ESOPHAGOGASTRODUODENOSCOPY)
Anesthesia: Moderate Sedation

## 2014-08-10 MED ORDER — SODIUM CHLORIDE 0.9 % IV SOLN
INTRAVENOUS | Status: DC
  Administered 2014-08-10: 18:00:00 via INTRAVENOUS

## 2014-08-10 MED ORDER — BUTAMBEN-TETRACAINE-BENZOCAINE 2-2-14 % EX AERO
INHALATION_SPRAY | CUTANEOUS | Status: DC | PRN
  Administered 2014-08-10: 2 via TOPICAL

## 2014-08-10 MED ORDER — MIDAZOLAM HCL 10 MG/2ML IJ SOLN
INTRAMUSCULAR | Status: AC
  Filled 2014-08-10: qty 2

## 2014-08-10 MED ORDER — FENTANYL CITRATE (PF) 100 MCG/2ML IJ SOLN
INTRAMUSCULAR | Status: DC | PRN
  Administered 2014-08-10: 25 ug via INTRAVENOUS

## 2014-08-10 MED ORDER — SODIUM CHLORIDE 0.9 % IV SOLN
INTRAVENOUS | Status: DC
  Administered 2014-08-10: 16:00:00 via INTRAVENOUS

## 2014-08-10 MED ORDER — SODIUM CHLORIDE 0.9 % IV SOLN
Freq: Once | INTRAVENOUS | Status: AC
  Administered 2014-08-10: 1000 mL via INTRAVENOUS

## 2014-08-10 MED ORDER — MIDAZOLAM HCL 10 MG/2ML IJ SOLN
INTRAMUSCULAR | Status: DC | PRN
  Administered 2014-08-10 (×2): 2 mg via INTRAVENOUS
  Administered 2014-08-10: 1 mg via INTRAVENOUS

## 2014-08-10 MED ORDER — FENTANYL CITRATE (PF) 100 MCG/2ML IJ SOLN
INTRAMUSCULAR | Status: AC
  Filled 2014-08-10: qty 2

## 2014-08-10 NOTE — Progress Notes (Signed)
PROGRESS NOTE    Luke Kaufman [id number] DOB: 06/08/1950 DOA: 08/09/2014 PCP: Wonda Cerise, MD  Primary Neurologist: Dr. Maryjean Morn Primary Cardiologist:? Dr. Marlowe Sax  HPI/Brief narrative 64 year old male with history of PUD, prior GI bleeding, essential hypertension, CAD/MI, left vertebral artery occlusion, CVA on aspirin & Plavix, alcohol use (4 or 5 beers a week) and ongoing tobacco abuse presented with 3 episodes of hematemesis, 5 episodes of melena, dizziness and some chest discomfort. He took a packet of Goody's powder on day of admission. In the ED, patient was mildly tachycardic, creatinine 1.54 and hemoglobin of 12.2. He was admitted for acute upper GI bleed. [facility] consulted and patient underwent EGD with submucosal injection of epinephrine of a pyloric ulcer. Due to concern for ongoing GI bleed, he underwent second EGD on 4/20 which showed the larger nonbleeding pyloric ulcer and 2 small ulcers in the gastric antrum.   Assessment/Plan:  Principal Problem:   Upper GI bleeding/Gastric ulcer with hemorrhage - Secondary to bleeding gastric ulcer - GI consult at and patient underwent EGD on 08/09/14 with submucosal injection with epinephrine, of the larger pyloric ulcer - Patient had significant drop in hemoglobin over the last 24 hours along with dark red blood on rectal exam 4/20 a.m. Due to concern for ongoing upper GI bleed, he underwent repeat EGD on 4/20 and there was no active bleeding. - Continue PPI drip - Clear liquid diet - Aspirin and Plavix currently on hold. We'll await GI recommendations regarding timing of resumption of same. - As per GI, if brisk rebleeding then may require IR intervention for embolization - Monitor hemoglobin closely and transfuse as needed  Active Problems:   Acute blood loss anemia - Hemoglobin dropped from 12.2 on admission 4/19 to 6.6 on 4/20 - Secondary to upper GI bleeding -Transfusing 2 units of PRBCs. Follow posttransfusion  CBC - Aim to keep hemoglobin >8 g per DL.     Essential hypertension - Antihypertensives were held on admission secondary to soft blood pressure. - Blood pressure is currently controlled off medications. Continue to monitor    Chest pain/elevated troponin/CAD - Likely from demand ischemia related to acute blood loss - Peak troponin of 0.11. EKG without acute changes - We will get 2-D echo to look for wall motion abnormalities - Consult cardiology - Chest pain resolved    Near syncope - Most likely secondary to acute blood loss anemia - Treat anemia and monitor - Telemetry without arrhythmia    CKD (chronic kidney disease) stage 3, GFR 30-59 ml/min - Baseline creatinine probably in the 1.5 range - Follow BMP    Tobacco abuse - Cessation counseled    Alcohol use - check CIWA   History of CVA - Aspirin and Plavix on hold secondary to acute upper GI bleed - Patient gives history of bilateral, left greater than right visual abnormality for the last month of unclear etiology. - Outpatient follow-up   Code Status: Full Family Communication: None at bedside Disposition Plan: DC home when medically stable-not yet.   Consultants:  [facility]  Procedures:  EGD 08/09/14: ENDOSCOPIC IMPRESSION: 1. The mucosa of the esophagus appeared normal 2. Single ulcer measuring 10 x 62mm in size was found at the pylorus; Submucosal injection of 81ml of epinephrine 1:10,000 was performed around the bleeding site; Complete hemostasis was achieved by placing a single hemoclip on the bleeding site 3. Two small ulcers were found in the prepyloric region of the stomach 4. Duodenal inflammation was found in the  duodenal bulb 5. The duodenal mucosa showed no abnormalities in the 2nd part of the duodenum  RECOMMENDATIONS: 1. Continue PPI infusion for 72 hours 2. Monitor hemoglobin closely, transfuse if necessary 3. No NSAIDs 4. Check H. pylori serum antibody and treat if  positive 5. Recommend repeat endoscopy in 12 weeks to ensure healing 6. Hold Plavix   EGD 08/10/14: ENDOSCOPIC IMPRESSION: 1. The mucosa of the esophagus appeared normal 2. Large ulcer was found at the pylorus, nonbleeding now, see above 3. Two small ulcers were found in the gastric antrum 4. Duodenal inflammation was found in the duodenal bulb 5. The duodenal mucosa showed no abnormalities in the 2nd part of the duodenum  RECOMMENDATIONS: 1. Continue PPI drip 2. Monitor Hgb after transfusion, repeat transfusion if necessary 3. If brisk rebleeding may require IR intervention for embolization  Antibiotics:  None   Subjective: Patient seen this morning prior to EGD. Denied further vomiting. Stated that he had 3 episodes of small black stools overnight. Denied abdominal pain, dizziness, lightheadedness or chest pain. Patient complains of intermittent "hue" around objects b/l but mostly left eye for last 1 month  Objective: Filed Vitals:   08/10/14 1149 08/10/14 1300 08/10/14 1322 08/10/14 1520  BP: 127/60 135/57 125/59 139/60  Pulse: 69 77 69 69  Temp: 98.3 F (36.8 C) 98.3 F (36.8 C) 98 F (36.7 C) 98.3 F (36.8 C)  TempSrc: Oral Oral Oral Oral  Resp: 20 20 20 20   Height:      Weight:      SpO2: 99% 99% 99% 98%    Intake/Output Summary (Last 24 hours) at 08/10/14 1551 Last data filed at 08/10/14 1307  Gross per 24 hour  Intake 2532.5 ml  Output   1200 ml  Net 1332.5 ml   Filed Weights   08/09/14 1429  Weight: 92.987 kg (205 lb)     Exam:  General exam: Pleasant middle-aged male lying comfortably supine in bed Respiratory system: Clear. No increased work of breathing. Cardiovascular system: S1 & S2 heard, RRR. No JVD, murmurs, gallops, clicks or pedal edema. Telemetry: Sinus rhythm. Occasional sinus tachycardia in the 140s. Gastrointestinal system: Abdomen is nondistended, soft and nontender. Normal bowel sounds heard. Central nervous  system: Alert and oriented. No focal neurological deficits. Extremities: Symmetric 5 x 5 power.   Data Reviewed: Basic Metabolic Panel:  Recent Labs Lab 08/09/14 0245  NA 138  K 4.0  CL 102  CO2 28  GLUCOSE 129*  BUN 39*  CREATININE 1.54*  CALCIUM 8.4   Liver Function Tests:  Recent Labs Lab 08/09/14 0245  AST 17  ALT 10  ALKPHOS 65  BILITOT 0.5  PROT 6.5  ALBUMIN 3.5    Recent Labs Lab 08/09/14 0245  LIPASE 29   No results for input(s): AMMONIA in the last 168 hours. CBC:  Recent Labs Lab 08/09/14 0245 08/09/14 1648 08/09/14 2245 08/10/14 0532  WBC 11.3* 9.7 9.5 8.5  NEUTROABS 9.8*  --   --   --   HGB 12.2* 8.0* 7.2* 6.6*  HCT 38.0* 24.4* 22.3* 20.7*  MCV 81.5 81.3 81.7 82.5  PLT 244 181 186 186   Cardiac Enzymes:  Recent Labs Lab 08/09/14 1036 08/09/14 1648 08/09/14 2245  TROPONINI 0.09* 0.11* 0.09*   BNP (last 3 results) No results for input(s): PROBNP in the last 8760 hours. CBG: No results for input(s): GLUCAP in the last 168 hours.  No results found for this or any previous visit (from the past 240  hour(s)).        Studies: No results found.      Scheduled Meds: . antiseptic oral rinse  7 mL Mouth Rinse q12n4p  . chlorhexidine  15 mL Mouth Rinse BID  . sodium chloride  3 mL Intravenous Q12H   Continuous Infusions: . pantoprozole (PROTONIX) infusion 8 mg/hr (08/09/14 2230)  . sodium chloride 0.9 % 1,000 mL infusion 75 mL/hr at 08/09/14 2231    Principal Problem:   Upper GI bleeding Active Problems:   Gastric ulcer with hemorrhage   Acute blood loss anemia   Essential hypertension   Chest pain   Near syncope   CKD (chronic kidney disease) stage 3, GFR 30-59 ml/min   Tobacco abuse    Time spent: 40 minutes.    Vernell Leep, MD, FACP, FHM. [facility] Pager [phone]  If 7PM-7AM, please contact night-coverage [email or web address] Password [email or web address] 08/10/2014, 3:51 PM    LOS: 1 day

## 2014-08-10 NOTE — Progress Notes (Signed)
    Patient passed a dark clot this am. On rectal exam he has dark red blood in vault and hgb has declined overnight. Suspect he is, or did bleed again post EGD with clip placement yesterday. Patient for repeat EGD with am. Transfusion was already ordered.   Tye Savoy, NP

## 2014-08-10 NOTE — Consult Note (Signed)
CARDIOLOGY CONSULT NOTE  Patient ID: Luke Kaufman MRN: [id number] DOB/AGE: July 24, 1950 64 y.o.  Admit date: 08/09/2014 Primary Physician Wonda Cerise, MD Primary Cardiologist Dr. Joette Catching Chief Complaint  Elevated troponin  HPI:  The patient presented with hematemesis.  On admission he did report near syncope.  GI saw him and he was found to have a single ulcer in the pylorus. This was injected with epinephrine. He had while inflammation.  He has required blood transfusion.  As he was thought to have ongoing bleeding a second EGD was done and hewas found to have a larger nonbleeding pyloric ulcer and smaller ulcers in the gastric antrum. Aspirin and Plavix have been on hold. We are called because he had some mildly elevated troponins. 0.11 and has trended down. He's not had any acute EKG changes. He denies any chest pains and knee.  He does have a questionable history of coronary disease reporting and MI at [facility] in April of last year without any stenting.  "I had a blockage behind my collar bone.".  He does not recall ever having a cath.  I don't have any access to these records.  He says he does not have symptoms. He can exercise. He denies any chest pressure, neck or arm discomfort. Doesn't have any palpitations, presyncope or syncope. He has no weight gain or edema.  Past Medical History  Diagnosis Date  . Hypertension   . Gastric ulcer   . Stroke     Past Surgical History  Procedure Laterality Date  . Esophagogastroduodenoscopy N/A 08/09/2014    Procedure: ESOPHAGOGASTRODUODENOSCOPY (EGD);  Surgeon: Jerene Bears, MD;  Location: [facility];  Service: Gastroenterology;  Laterality: N/A;    No Known Allergies Prescriptions prior to admission  Medication Sig Dispense Refill Last Dose  . amLODipine (NORVASC) 10 MG tablet Take 1 tablet (10 mg total) by mouth daily. 30 tablet 0 2 weeks ago  . aspirin EC 81 MG EC tablet Take 1 tablet (81 mg total) by mouth daily.  (Patient taking differently: Take 162 mg by mouth 2 (two) times daily. ) 30 tablet 0 08/08/2014 at Unknown time  . metoprolol (LOPRESSOR) 50 MG tablet Take 50 mg by mouth 2 (two) times daily.   08/08/2014 at 1800  . simvastatin (ZOCOR) 20 MG tablet Take 1 tablet (20 mg total) by mouth daily at 6 PM. 30 tablet 0 2 weeks ago  . clopidogrel (PLAVIX) 75 MG tablet Take 1 tablet (75 mg total) by mouth daily with breakfast. 30 tablet 0 2 weeks ago  . nicotine (NICODERM CQ - DOSED IN MG/24 HOURS) 14 mg/24hr patch Place 1 patch (14 mg total) onto the skin daily. (Patient not taking: Reported on 08/09/2014) 28 patch 0    History reviewed. No pertinent family history.  History   Social History  . Marital Status: Single    Spouse Name: N/A  . Number of Children: N/A  . Years of Education: N/A   Occupational History  . Not on file.   Social History Main Topics  . Smoking status: Current Every Day Smoker -- 1.50 packs/day  . Smokeless tobacco: Not on file  . Alcohol Use: Yes     Comment: occasional  . Drug Use: No  . Sexual Activity: Not on file   Other Topics Concern  . Not on file   Social History Narrative     ROS:  Mild orthostatis.   As stated in the HPI and negative for all other systems.  Physical Exam: Blood pressure 139/60, pulse 69, temperature 98.3 F (36.8 C), temperature source Oral, resp. rate 20, height 5\' 7"  (1.702 m), weight 205 lb (92.987 kg), SpO2 98 %.  GENERAL:  Well appearing HEENT:  Pupils equal round and reactive, fundi not visualized, oral mucosa unremarkable NECK:  No jugular venous distention, waveform within normal limits, carotid upstroke brisk and symmetric, no bruits, no thyromegaly LYMPHATICS:  No cervical, inguinal adenopathy LUNGS:  Clear to auscultation bilaterally BACK:  No CVA tenderness CHEST:  Unremarkable HEART:  PMI not displaced or sustained,S1 and S2 within normal limits, no S3, no S4, no clicks, no rubs, no murmurs ABD:  Flat, positive bowel  sounds normal in frequency in pitch, no bruits, no rebound, no guarding, no midline pulsatile mass, no hepatomegaly, no splenomegaly EXT:  2 plus pulses throughout, no edema, no cyanosis no clubbing SKIN:  No rashes no nodules NEURO:  Cranial nerves II through XII grossly intact, motor grossly intact throughout PSYCH:  Cognitively intact, oriented to person place and time   Labs: Lab Results  Component Value Date   BUN 39* 08/09/2014   Lab Results  Component Value Date   CREATININE 1.54* 08/09/2014   Lab Results  Component Value Date   NA 138 08/09/2014   K 4.0 08/09/2014   CL 102 08/09/2014   CO2 28 08/09/2014   Lab Results  Component Value Date   TROPONINI 0.09* 08/09/2014   Lab Results  Component Value Date   WBC 8.5 08/10/2014   HGB 6.6* 08/10/2014   HCT 20.7* 08/10/2014   MCV 82.5 08/10/2014   PLT 186 08/10/2014   Lab Results  Component Value Date   CHOL 170 05/17/2013   HDL 25* 05/17/2013   LDLCALC 105* 05/17/2013   TRIG 200* 05/17/2013   CHOLHDL 6.8 05/17/2013   Lab Results  Component Value Date   ALT 10 08/09/2014   AST 17 08/09/2014   ALKPHOS 65 08/09/2014   BILITOT 0.5 08/09/2014      EKG:NSR, rate 91, axis WNL, intervals WNL, possible old inferior MI.  Nonspecific T wave inversion in the inferior leads unchanged from previous.  ASSESSMENT AND PLAN:   ELEVATED TROPONIN:  This is nonspecific given the severity of his anemia and his acute illness and absence of chest pain symptoms by his report. An echocardiogram is pending. If this demonstrates no significant wall motion abnormality compared with previous I would not suggest further testing would be indicated.  TOBACCO:  He and I had a long discussion about this. I suggest He tries to quit cold [place].   SignedMinus Breeding 08/10/2014, 6:10 PM

## 2014-08-10 NOTE — Op Note (Signed)
[facility] [place], [address]   ENDOSCOPY PROCEDURE REPORT  PATIENT: Luke Kaufman, Luke Kaufman  MR#: [id number] BIRTHDATE: [birth year] , 63  yrs. old GENDER: male ENDOSCOPIST: Jerene Bears, MD REFERRED BY:  [facility] PROCEDURE DATE:  08/10/2014 PROCEDURE:  EGD, diagnostic ASA CLASS:     Class III INDICATIONS:  recurrent melena with further drop in Hgb in patient with known pyloric channel ulcer s/p injection and clip yesterday.  MEDICATIONS: Fentanyl 25 mcg IV and Versed 5 mg IV TOPICAL ANESTHETIC: Cetacaine Spray  DESCRIPTION OF PROCEDURE: After the risks benefits and alternatives of the procedure were thoroughly explained, informed consent was obtained.  The Pentax Gastroscope [id number] endoscope was introduced through the mouth and advanced to the second portion of the duodenum , Without limitations.  The instrument was slowly withdrawn as the mucosa was fully examined.  ESOPHAGUS: The mucosa of the esophagus appeared normal.  STOMACH: A large non-bleeding and deep ulcer with a large pigmented spot and surrounding edema was found at the pylorus.  Hemostatic clip placed yesterday is no longer present.  Given lack of bleeding today, difficult location in the pyloric channel and inability to close the entire lesion with hemostatic clips due to the large size, additional hemostatic clips were not placed today.   Two small non-bleeding, clean-based and shallow ulcers were found in the gastric antrum.  DUODENUM: Duodenal inflammation was found in the duodenal bulb. The duodenal mucosa showed no abnormalities in the 2nd part of the duodenum.  Retroflexed views revealed no abnormalities.     The scope was then withdrawn from the patient and the procedure completed.  COMPLICATIONS: There were no immediate complications.  ENDOSCOPIC IMPRESSION: 1.   The mucosa of the esophagus appeared normal 2.   Large ulcer was found at the pylorus, nonbleeding  now, see above 3.   Two small ulcers were found in the gastric antrum 4.   Duodenal inflammation was found in the duodenal bulb 5.   The duodenal mucosa showed no abnormalities in the 2nd part of the duodenum  RECOMMENDATIONS: 1.  Continue PPI drip 2.  Monitor Hgb after transfusion, repeat transfusion if necessary 3.  If brisk rebleeding may require IR intervention for embolization   eSigned:  Jerene Bears, MD 08/10/2014 10:15 AM    CC: the patient  PATIENT NAME:  Luke Kaufman, Luke Kaufman MR#: [id number]

## 2014-08-10 NOTE — Progress Notes (Signed)
Drop in hgb overnight with 2 additional units of pRBC ordered this am at 06:00 Did pass one dark blot but no copious melena/hematochezia [place] EGD is recommended given ulcer with VV seen yesterday The nature of the procedure, as well as the risks, benefits, and alternatives were carefully and thoroughly reviewed with the patient. Ample time for discussion and questions allowed. The patient understood, was satisfied, and agreed to proceed.

## 2014-08-11 ENCOUNTER — Encounter (HOSPITAL_COMMUNITY): Payer: Self-pay | Admitting: Internal Medicine

## 2014-08-11 ENCOUNTER — Other Ambulatory Visit: Payer: Self-pay | Admitting: Physician Assistant

## 2014-08-11 ENCOUNTER — Encounter: Payer: Self-pay | Admitting: Internal Medicine

## 2014-08-11 DIAGNOSIS — E876 Hypokalemia: Secondary | ICD-10-CM

## 2014-08-11 DIAGNOSIS — R079 Chest pain, unspecified: Secondary | ICD-10-CM

## 2014-08-11 DIAGNOSIS — D696 Thrombocytopenia, unspecified: Secondary | ICD-10-CM

## 2014-08-11 DIAGNOSIS — D62 Acute posthemorrhagic anemia: Secondary | ICD-10-CM

## 2014-08-11 LAB — CBC
HCT: 24.3 % — ABNORMAL LOW (ref 39.0–52.0)
HCT: 24.6 % — ABNORMAL LOW (ref 39.0–52.0)
HEMOGLOBIN: 7.9 g/dL — AB (ref 13.0–17.0)
Hemoglobin: 8 g/dL — ABNORMAL LOW (ref 13.0–17.0)
MCH: 27.5 pg (ref 26.0–34.0)
MCH: 27.5 pg (ref 26.0–34.0)
MCHC: 32.5 g/dL (ref 30.0–36.0)
MCHC: 32.5 g/dL (ref 30.0–36.0)
MCV: 84.5 fL (ref 78.0–100.0)
MCV: 84.7 fL (ref 78.0–100.0)
Platelets: 141 10*3/uL — ABNORMAL LOW (ref 150–400)
Platelets: 147 10*3/uL — ABNORMAL LOW (ref 150–400)
RBC: 2.87 MIL/uL — ABNORMAL LOW (ref 4.22–5.81)
RBC: 2.91 MIL/uL — ABNORMAL LOW (ref 4.22–5.81)
RDW: 15.6 % — ABNORMAL HIGH (ref 11.5–15.5)
RDW: 15.8 % — ABNORMAL HIGH (ref 11.5–15.5)
WBC: 7.2 10*3/uL (ref 4.0–10.5)
WBC: 9.1 10*3/uL (ref 4.0–10.5)

## 2014-08-11 LAB — BASIC METABOLIC PANEL
Anion gap: 6 (ref 5–15)
BUN: 18 mg/dL (ref 6–23)
CALCIUM: 7.8 mg/dL — AB (ref 8.4–10.5)
CO2: 24 mmol/L (ref 19–32)
Chloride: 110 mmol/L (ref 96–112)
Creatinine, Ser: 1.32 mg/dL (ref 0.50–1.35)
GFR calc Af Amer: 65 mL/min — ABNORMAL LOW (ref >90)
GFR calc non Af Amer: 56 mL/min — ABNORMAL LOW (ref >90)
GLUCOSE: 96 mg/dL (ref 70–99)
Potassium: 3.4 mmol/L — ABNORMAL LOW (ref 3.5–5.1)
Sodium: 140 mmol/L (ref 135–145)

## 2014-08-11 MED ORDER — POTASSIUM CHLORIDE CRYS ER 20 MEQ PO TBCR
40.0000 meq | EXTENDED_RELEASE_TABLET | Freq: Once | ORAL | Status: AC
  Administered 2014-08-11: 40 meq via ORAL
  Filled 2014-08-11: qty 2

## 2014-08-11 NOTE — Progress Notes (Addendum)
PROGRESS NOTE    Luke Kaufman [id number] DOB: [birth year] DOA: 08/09/2014 PCP: Wonda Cerise, MD  Primary Neurologist: Dr. Maryjean Morn Primary Cardiologist:? Dr. Marlowe Sax  HPI/Brief narrative 64 year old male with history of PUD, prior GI bleeding, essential hypertension, CAD/MI, left vertebral artery occlusion, CVA on aspirin & Plavix, alcohol use (4 or 5 beers a week) and ongoing tobacco abuse presented with 3 episodes of hematemesis, 5 episodes of melena, dizziness and some chest discomfort. He took a packet of Goody's powder on day of admission. In the ED, patient was mildly tachycardic, creatinine 1.54 and hemoglobin of 12.2. He was admitted for acute upper GI bleed. [facility] consulted and patient underwent EGD with submucosal injection of epinephrine of a pyloric ulcer. Due to concern for ongoing GI bleed, he underwent second EGD on 4/20 which showed the larger nonbleeding pyloric ulcer and 2 small ulcers in the gastric antrum.   Assessment/Plan:  Principal Problem:   Upper GI bleeding/Gastric ulcer with hemorrhage - Secondary to bleeding gastric ulcer - GI consulted and patient underwent EGD on 08/09/14 with submucosal injection with epinephrine, of the larger pyloric ulcer - Next day after 1st EGD, patient had significant drop in hemoglobin of ~ 2g over the previous 24 hours along with dark red blood on rectal exam 4/20 a.m. Due to concern for ongoing upper GI bleed, he underwent repeat EGD on 4/20 and there was no active bleeding. - Continue PPI drip - Clear liquid diet - Aspirin and Plavix currently on hold. We'll await GI recommendations regarding timing of resumption of same. - As per GI, if brisk rebleeding then may require IR intervention for embolization - Monitor hemoglobin closely and transfuse as needed - GI bleeding seems to have abated clinically as evidenced by decreased Malena and stable Hb. - Await GI follow up re advancing diet, change PPI to BID- PO/IV, timing  of resumption of antiplatelets  Active Problems:   Acute blood loss anemia - Hemoglobin dropped from 12.2 on admission 4/19 to 6.6 on 4/20 - Secondary to upper GI bleeding - S/P 2 units of PRBCs.  - Hemoglobin stable in the 7.9-8.1 range over the last 12 hours. Follow CBC in a.m. - Aim to keep hemoglobin >8 g per DL.     Essential hypertension - Antihypertensives were held on admission secondary to soft blood pressure. - Blood pressure is currently controlled off medications.  - May consider initiating one of her antihypertensives due to blood pressures starting to climb up.    Chest pain/elevated troponin/CAD - Likely from demand ischemia related to acute blood loss - Peak troponin of 0.11. EKG without acute changes - 2-D echo: Pending - Cardiology consultation and follow-up appreciated. Likely presentation due to demand ischemia. If echo does not show wall motion abnormalities, no further cardiology evaluation. - Chest pain resolved    Near syncope - Most likely secondary to acute blood loss anemia - Treat anemia and monitor - Telemetry without arrhythmia    CKD (chronic kidney disease) stage 3, GFR 30-59 ml/min - Baseline creatinine probably in the 1.5 range - Creatinine 4/21:1.3    Tobacco abuse - Cessation counseled    Alcohol use - check CIWA - No overt withdrawal   History of CVA - Aspirin and Plavix on hold secondary to acute upper GI bleed - Patient gives history of bilateral, left greater than right visual abnormality for the last month of unclear etiology. - Outpatient follow-up   Hypokalemia - Replace and follow   Thrombocytopenia  -  possibly d/t ABLA - follow CBC in am.   Code Status: Full Family Communication: None at bedside Disposition Plan: DC home when medically stable- possibly 08/12/14   Consultants:  [facility]  Procedures:  EGD 08/09/14: ENDOSCOPIC IMPRESSION: 1. The mucosa of the esophagus appeared normal 2. Single ulcer  measuring 10 x 82mm in size was found at the pylorus; Submucosal injection of 45ml of epinephrine 1:10,000 was performed around the bleeding site; Complete hemostasis was achieved by placing a single hemoclip on the bleeding site 3. Two small ulcers were found in the prepyloric region of the stomach 4. Duodenal inflammation was found in the duodenal bulb 5. The duodenal mucosa showed no abnormalities in the 2nd part of the duodenum  RECOMMENDATIONS: 1. Continue PPI infusion for 72 hours 2. Monitor hemoglobin closely, transfuse if necessary 3. No NSAIDs 4. Check H. pylori serum antibody and treat if positive 5. Recommend repeat endoscopy in 12 weeks to ensure healing 6. Hold Plavix   EGD 08/10/14: ENDOSCOPIC IMPRESSION: 1. The mucosa of the esophagus appeared normal 2. Large ulcer was found at the pylorus, nonbleeding now, see above 3. Two small ulcers were found in the gastric antrum 4. Duodenal inflammation was found in the duodenal bulb 5. The duodenal mucosa showed no abnormalities in the 2nd part of the duodenum  RECOMMENDATIONS: 1. Continue PPI drip 2. Monitor Hgb after transfusion, repeat transfusion if necessary 3. If brisk rebleeding may require IR intervention for embolization  Antibiotics:  None   Subjective: Patient had single soft black pudding-like BM last night. Denies dizziness, lightheadedness or chest pain or dyspnea.  Objective: Filed Vitals:   08/10/14 2103 08/11/14 0637 08/11/14 0745 08/11/14 1011  BP: 151/73 126/63 150/61 140/60  Pulse: 75 72 61 66  Temp: 98.4 F (36.9 C) 98.1 F (36.7 C) 97.9 F (36.6 C) 97.9 F (36.6 C)  TempSrc: Oral Oral Oral Oral  Resp: 20 20 20 20   Height:      Weight:      SpO2: 95% 99% 98% 98%    Intake/Output Summary (Last 24 hours) at 08/11/14 1100 Last data filed at 08/11/14 0859  Gross per 24 hour  Intake 1902.5 ml  Output   2050 ml  Net -147.5 ml   Filed Weights   08/09/14  1429  Weight: 92.987 kg (205 lb)     Exam:  General exam: Pleasant middle-aged male lying comfortably supine in bed Respiratory system: Clear. No increased work of breathing. Cardiovascular system: S1 & S2 heard, RRR. No JVD, murmurs, gallops, clicks or pedal edema. Telemetry: SB 50's - SR. Gastrointestinal system: Abdomen is nondistended, soft and nontender. Normal bowel sounds heard. Central nervous system: Alert and oriented. No focal neurological deficits. Extremities: Symmetric 5 x 5 power.   Data Reviewed: Basic Metabolic Panel:  Recent Labs Lab 08/09/14 0245 08/11/14 0525  NA 138 140  K 4.0 3.4*  CL 102 110  CO2 28 24  GLUCOSE 129* 96  BUN 39* 18  CREATININE 1.54* 1.32  CALCIUM 8.4 7.8*   Liver Function Tests:  Recent Labs Lab 08/09/14 0245  AST 17  ALT 10  ALKPHOS 65  BILITOT 0.5  PROT 6.5  ALBUMIN 3.5    Recent Labs Lab 08/09/14 0245  LIPASE 29   No results for input(s): AMMONIA in the last 168 hours. CBC:  Recent Labs Lab 08/09/14 0245 08/09/14 1648 08/09/14 2245 08/10/14 0532 08/10/14 2023 08/10/14 2357 08/11/14 0525  WBC 11.3* 9.7 9.5 8.5  --  9.1  7.2  NEUTROABS 9.8*  --   --   --   --   --   --   HGB 12.2* 8.0* 7.2* 6.6* 8.1* 7.9* 8.0*  HCT 38.0* 24.4* 22.3* 20.7* 24.4* 24.3* 24.6*  MCV 81.5 81.3 81.7 82.5  --  84.7 84.5  PLT 244 181 186 186  --  147* 141*   Cardiac Enzymes:  Recent Labs Lab 08/09/14 1036 08/09/14 1648 08/09/14 2245  TROPONINI 0.09* 0.11* 0.09*   BNP (last 3 results) No results for input(s): PROBNP in the last 8760 hours. CBG: No results for input(s): GLUCAP in the last 168 hours.  No results found for this or any previous visit (from the past 240 hour(s)).        Studies: No results found.      Scheduled Meds: . antiseptic oral rinse  7 mL Mouth Rinse q12n4p  . chlorhexidine  15 mL Mouth Rinse BID  . sodium chloride  3 mL Intravenous Q12H   Continuous Infusions: . pantoprozole  (PROTONIX) infusion 8 mg/hr (08/11/14 0746)    Principal Problem:   Upper GI bleeding Active Problems:   Gastric ulcer with hemorrhage   Acute blood loss anemia   Essential hypertension   Chest pain   Near syncope   CKD (chronic kidney disease) stage 3, GFR 30-59 ml/min   Tobacco abuse    Time spent: 20 minutes.    Vernell Leep, MD, FACP, FHM. [facility] Pager [phone]  If 7PM-7AM, please contact night-coverage [email or web address] Password [email or web address] 08/11/2014, 11:00 AM    LOS: 2 days

## 2014-08-11 NOTE — Progress Notes (Signed)
[facility] Progress Note  Subjective:   Feels well. Tol clears. Hungry. Had repeat EGD  Yesterday with large ulcer clipped but ulcer not completely closed. No melena today.   Objective:  Vital signs in last 24 hours: Temp:  [97.9 F (36.6 C)-98.4 F (36.9 C)] 97.9 F (36.6 C) (04/21 1011) Pulse Rate:  [61-77] 66 (04/21 1011) Resp:  [20] 20 (04/21 1011) BP: (125-151)/(57-73) 140/60 mmHg (04/21 1011) SpO2:  [95 %-99 %] 98 % (04/21 1011) Last BM Date: 08/09/14 General:   Alert,  Well-developed,    in NAD Heart:  Regular rate and rhythm; no murmurs Pulm;lungs clear Abdomen:  Soft, nontender and nondistended. Normal bowel sounds, without guarding, and without rebound.   Extremities:  Without edema. Neurologic: Alert and  oriented x4;  grossly normal neurologically. Psych: Alert and cooperative. Normal mood and affect.  Intake/Output from previous day: 04/20 0701 - 04/21 0700 In: 2247.5 [P.O.:720; I.V.:857.5; Blood:670] Out: 2700 [Urine:2700] Intake/Output this shift: Total I/O In: 480 [P.O.:480] Out: 550 [Urine:550]  Lab Results:  Recent Labs  08/10/14 0532 08/10/14 2023 08/10/14 2357 08/11/14 0525  WBC 8.5  --  9.1 7.2  HGB 6.6* 8.1* 7.9* 8.0*  HCT 20.7* 24.4* 24.3* 24.6*  PLT 186  --  147* 141*   BMET  Recent Labs  08/09/14 0245 08/11/14 0525  NA 138 140  K 4.0 3.4*  CL 102 110  CO2 28 24  GLUCOSE 129* 96  BUN 39* 18  CREATININE 1.54* 1.32  CALCIUM 8.4 7.8*   LFT  Recent Labs  08/09/14 0245  PROT 6.5  ALBUMIN 3.5  AST 17  ALT 10  ALKPHOS 65  BILITOT 0.5   PT/INR  Recent Labs  08/09/14 0745  LABPROT 15.4*  INR 1.20   ENDOSCOPIES: ENDOSCOPIC IMPRESSION: 1. The mucosa of the esophagus appeared normal 2. Single ulcer measuring 10 x 41mm in size was found at the pylorus; Submucosal injection of 29ml of epinephrine 1:10,000 was performed around the bleeding site; Complete hemostasis was achieved by placing a single  hemoclip on the bleeding site 3. Two small ulcers were found in the prepyloric region of the stomach 4. Duodenal inflammation was found in the duodenal bulb 5. The duodenal mucosa showed no abnormalities in the 2nd part of the duodenum RECOMMENDATIONS: 1. Continue PPI infusion for 72 hours 2. Monitor hemoglobin closely, transfuse if necessary 3. No NSAIDs 4. Check H. pylori serum antibody and treat if positive 5. Recommend repeat endoscopy in 12 weeks to ensure healing 6. Hold Plavix eSigned: Jerene Bears, MD 08/09/2014 3:53 PM  ENDOSCOPIC IMPRESSION: 1. The mucosa of the esophagus appeared normal 2. Large ulcer was found at the pylorus, nonbleeding now, see above 3. Two small ulcers were found in the gastric antrum 4. Duodenal inflammation was found in the duodenal bulb 5. The duodenal mucosa showed no abnormalities in the 2nd part of the duodenum RECOMMENDATIONS: 1. Continue PPI drip 2. Monitor Hgb after transfusion, repeat transfusion if necessary 3. If brisk rebleeding may require IR intervention for embolization Page 1 of 2 eSigned: Jerene Bears, MD 08/10/2014 10:15 AM  ASSESSMENT/PLAN:   Pt with GI bleed--s/p egd x 2, secong with clip placed on ulcer. May advance diet. Continue PPI bid. Will need follow up in GI office in 1 1/2-2 weeks, then repeat EGD in 8 weeks.Revbiewd with Dr Hilarie Fredrickson and would hold off on restarting aspirin/plavix til he is seen in GI office in 2 weeks and h/h rechecked.  LOS: 2 days   Stori Royse, Vita Barley PA-C 08/11/2014, Pager [phone]

## 2014-08-11 NOTE — Progress Notes (Signed)
  Echocardiogram 2D Echocardiogram has been performed.  Luke Kaufman 08/11/2014, 2:57 PM

## 2014-08-11 NOTE — Progress Notes (Signed)
Patient Name: Luke Kaufman Date of Encounter: 08/11/2014     Principal Problem:   Upper GI bleeding Active Problems:   Essential hypertension   Chest pain   Near syncope   CKD (chronic kidney disease) stage 3, GFR 30-59 ml/min   Tobacco abuse   Gastric ulcer with hemorrhage   Acute blood loss anemia    SUBJECTIVE nonproductive cough.  No chest pain.  Telemetry shows normal sinus rhythm  CURRENT MEDS . antiseptic oral rinse  7 mL Mouth Rinse q12n4p  . chlorhexidine  15 mL Mouth Rinse BID  . sodium chloride  3 mL Intravenous Q12H    OBJECTIVE  Filed Vitals:   08/10/14 1520 08/10/14 2103 08/11/14 0637 08/11/14 0745  BP: 139/60 151/73 126/63 150/61  Pulse: 69 75 72 61  Temp: 98.3 F (36.8 C) 98.4 F (36.9 C) 98.1 F (36.7 C) 97.9 F (36.6 C)  TempSrc: Oral Oral Oral Oral  Resp: 20 20 20 20   Height:      Weight:      SpO2: 98% 95% 99% 98%    Intake/Output Summary (Last 24 hours) at 08/11/14 0832 Last data filed at 08/11/14 [id number]  Gross per 24 hour  Intake 2007.5 ml  Output   3250 ml  Net -1242.5 ml   Filed Weights   08/09/14 1429  Weight: 205 lb (92.987 kg)    PHYSICAL EXAM  General: Pleasant, NAD. Neuro: Alert and oriented X 3. Moves all extremities spontaneously. Psych: Normal affect. HEENT:  Normal  Neck: Supple without bruits or JVD. Lungs:  Resp regular and unlabored, CTA. Heart: RRR no s3, s4, or murmurs. Abdomen: Soft, non-tender, non-distended, BS + x 4.  Extremities: No clubbing, cyanosis or edema. DP/PT/Radials 2+ and equal bilaterally.  Accessory Clinical Findings  CBC  Recent Labs  08/09/14 0245  08/10/14 2357 08/11/14 0525  WBC 11.3*  < > 9.1 7.2  NEUTROABS 9.8*  --   --   --   HGB 12.2*  < > 7.9* 8.0*  HCT 38.0*  < > 24.3* 24.6*  MCV 81.5  < > 84.7 84.5  PLT 244  < > 147* 141*  < > = values in this interval not displayed. Basic Metabolic Panel  Recent Labs  08/09/14 0245 08/11/14 0525  NA 138 140  K 4.0 3.4*  CL  102 110  CO2 28 24  GLUCOSE 129* 96  BUN 39* 18  CREATININE 1.54* 1.32  CALCIUM 8.4 7.8*   Liver Function Tests  Recent Labs  08/09/14 0245  AST 17  ALT 10  ALKPHOS 65  BILITOT 0.5  PROT 6.5  ALBUMIN 3.5    Recent Labs  08/09/14 0245  LIPASE 29   Cardiac Enzymes  Recent Labs  08/09/14 1036 08/09/14 1648 08/09/14 2245  TROPONINI 0.09* 0.11* 0.09*   BNP Invalid input(s): POCBNP D-Dimer No results for input(s): DDIMER in the last 72 hours. Hemoglobin A1C No results for input(s): HGBA1C in the last 72 hours. Fasting Lipid Panel No results for input(s): CHOL, HDL, LDLCALC, TRIG, CHOLHDL, LDLDIRECT in the last 72 hours. Thyroid Function Tests No results for input(s): TSH, T4TOTAL, T3FREE, THYROIDAB in the last 72 hours.  Invalid input(s): FREET3  TELE  Normal sinus rhythm  ECG    Radiology/Studies  No results found.  ASSESSMENT AND PLAN 1.  Elevated troponin.  This is nonspecific.  Await results of echocardiogram.  If there is no significant wall motion abnormality, no further testing. 2.  Tobacco abuse 3.  Recent GI bleed with severe anemia   Signed, Darlin Coco MD

## 2014-08-12 DIAGNOSIS — K920 Hematemesis: Secondary | ICD-10-CM

## 2014-08-12 DIAGNOSIS — R110 Nausea: Secondary | ICD-10-CM

## 2014-08-12 LAB — BASIC METABOLIC PANEL
Anion gap: 4 — ABNORMAL LOW (ref 5–15)
BUN: 12 mg/dL (ref 6–23)
CO2: 25 mmol/L (ref 19–32)
Calcium: 7.7 mg/dL — ABNORMAL LOW (ref 8.4–10.5)
Chloride: 109 mmol/L (ref 96–112)
Creatinine, Ser: 1.39 mg/dL — ABNORMAL HIGH (ref 0.50–1.35)
GFR, EST AFRICAN AMERICAN: 60 mL/min — AB (ref >90)
GFR, EST NON AFRICAN AMERICAN: 52 mL/min — AB (ref >90)
Glucose, Bld: 97 mg/dL (ref 70–99)
Potassium: 3.6 mmol/L (ref 3.5–5.1)
SODIUM: 138 mmol/L (ref 135–145)

## 2014-08-12 LAB — CBC
HEMATOCRIT: 23.5 % — AB (ref 39.0–52.0)
HEMOGLOBIN: 7.6 g/dL — AB (ref 13.0–17.0)
MCH: 27.4 pg (ref 26.0–34.0)
MCHC: 32.3 g/dL (ref 30.0–36.0)
MCV: 84.8 fL (ref 78.0–100.0)
Platelets: 151 10*3/uL (ref 150–400)
RBC: 2.77 MIL/uL — AB (ref 4.22–5.81)
RDW: 15.6 % — ABNORMAL HIGH (ref 11.5–15.5)
WBC: 6.6 10*3/uL (ref 4.0–10.5)

## 2014-08-12 LAB — PREPARE RBC (CROSSMATCH)

## 2014-08-12 MED ORDER — PANTOPRAZOLE SODIUM 40 MG PO TBEC
40.0000 mg | DELAYED_RELEASE_TABLET | Freq: Two times a day (BID) | ORAL | Status: DC
Start: 2014-08-12 — End: 2014-08-13
  Administered 2014-08-12 – 2014-08-13 (×3): 40 mg via ORAL
  Filled 2014-08-12 (×4): qty 1

## 2014-08-12 MED ORDER — SODIUM CHLORIDE 0.9 % IV SOLN
Freq: Once | INTRAVENOUS | Status: AC
  Administered 2014-08-12: 10:00:00 via INTRAVENOUS

## 2014-08-12 NOTE — Progress Notes (Signed)
     [facility] Progress Note  Subjective:   Hgb 7.6 today. Receiving a unit of blood. No complaints. Hungry.No melena today.  Objective:  Vital signs in last 24 hours: Temp:  [97.7 F (36.5 C)-99.1 F (37.3 C)] 97.7 F (36.5 C) (04/22 1032) Pulse Rate:  [65-70] 69 (04/22 1032) Resp:  [20] 20 (04/22 1032) BP: (130-156)/(53-90) 147/83 mmHg (04/22 1032) SpO2:  [96 %-99 %] 99 % (04/22 1032) Last BM Date: 08/09/14 General:   Alert,  Well-developed,  in NAD Heart:  Regular rate and rhythm; no murmurs Pulm;lungs clear Abdomen:  Soft, nontender and nondistended. Normal bowel sounds, without guarding, and without rebound.   Extremities:  Without edema. Neurologic: Alert and  oriented x4;  grossly normal neurologically. Psych: Alert and cooperative. Normal mood and affect.  Intake/Output from previous day: 04/21 0701 - 04/22 0700 In: 2327.1 [P.O.:600; I.V.:1727.1] Out: 550 [Urine:550] Intake/Output this shift:    Lab Results:  Recent Labs  08/10/14 2357 08/11/14 0525 08/12/14 0510  WBC 9.1 7.2 6.6  HGB 7.9* 8.0* 7.6*  HCT 24.3* 24.6* 23.5*  PLT 147* 141* 151   BMET  Recent Labs  08/11/14 0525 08/12/14 0510  NA 140 138  K 3.4* 3.6  CL 110 109  CO2 24 25  GLUCOSE 96 97  BUN 18 12  CREATININE 1.32 1.39*  CALCIUM 7.8* 7.7*     ASSESSMENT/PLAN:   Pt with GI bleed--s/p egd x 2, secong with clip placed on ulcer. May advance to soft diet. Continue PPI bid. Will need follow up in GI office in 1 1/2-2 weeks, then repeat EGD in 8 weeks.Reviewed with Dr Hilarie Fredrickson and would hold off on restarting aspirin/plavix til he is seen in GI office in 2 weeks and h/h rechecked. ( If bleeding recurs may need IR for possible embolization).     LOS: 3 days   Naraya Stoneberg, Deloris Ping 08/12/2014, Pager [phone]

## 2014-08-12 NOTE — Progress Notes (Signed)
Two-dimensional echocardiogram shows vigorous left ventricular systolic function.  Ejection fraction 70%.  No wall motion and modalities. No further cardiac testing indicated.  We will sign off.

## 2014-08-12 NOTE — Progress Notes (Addendum)
[facility] PROGRESS NOTE Interim History: 64 year old male with history of PUD, prior GI bleeding, essential hypertension, CAD/MI, left vertebral artery occlusion, CVA on aspirin & Plavix, alcohol use (4 or 5 beers a week) and ongoing tobacco abuse presented with 3 episodes of hematemesis, 5 episodes of melena, dizziness and some chest discomfort. He took a packet of Goody's powder on day of admission. On admisison hemoglobin of 12.2. [facility] consulted and patient underwent EGD with submucosal injection of epinephrine of a pyloric ulcer. Due to concern for ongoing GI bleed, he underwent second EGD on 4/20 which showed the larger nonbleeding pyloric ulcer and 2 small ulcers in the gastric antrum.  Assessment/Plan: Acute Upper GI bleeding/ Gastric ulcer with hemorrhage/Acute blood loss anemia - Started on IV PPI, aspirin and Plavix discontinued, consulted GI recommended an EGD - Status post EGD on 08/09/2014 with submucosal injection with epinephrine of a large pyloric ulcer. Status post 2 units of packed red blood cells on 08/10/2014 - After this the patient had a 2 g drop of his hemoglobin along with dark red stools on rectal exam 08/10/2014, underwent repeated EGD on 4.20 that showed a large nonbleeding pyloric ulcer and 2 small gastric antrum ulcer. - If bleeding recurs may need interventional radiologist for possible embolization. - Hemoglobin continues to drift down - Hemoglobin continues to drop on 08/12/2014 6.6 we'll go ahead and transfuse 2 units of packed red blood cells. - Check repeat hemoglobin closer to 9. As he has a known history of coronary artery sees an MI. - Diet per GI.  Thrombocytopenia: - Now resolved. - Likely due to acute blood loss anemia.  Essential hypertension: - Antihypertensive were held on admission. - Blood pressure is rising gently  Near syncope: - Likely due to acute blood loss anemia.  Tobacco abuse  Chest pain/Elevated troponins: -  Likely demand ischemia from dropping hemoglobin - The setting of GI bleed, aspirin and Plavix have been on hold. - Consulted cardiology recommended an echocardiogram that showed an EF of 70% with no wall motion abnormality. - We'll try to keep hemoglobin greater than 9.  CKD (chronic kidney disease) stage 3, GFR 30-59 ml/min - Baseline creatinine probably in the 1.5 range - Creatinine 4/21:1.3  HypoCalcemia: When corrected for the albumin seems within normal limits.    Hypokalemia - Replace and monitor. Alcohol abuse; - Signs of withdrawal.  History of CVA: Aspirin and Plavix on hold due to upper GI bleed.  Code Status: Full Family Communication: None at bedside Disposition Plan: DC home when medically stable- possibly 08/13/14  Consultants:  [facility]  Procedures:  EGD 08/09/14: ENDOSCOPIC IMPRESSION: 1. The mucosa of the esophagus appeared normal 2. Single ulcer measuring 10 x 31mm in size was found at the pylorus; Submucosal injection of 27ml of epinephrine 1:10,000 was performed around the bleeding site; Complete hemostasis was achieved by placing a single hemoclip on the bleeding site 3. Two small ulcers were found in the prepyloric region of the stomach 4. Duodenal inflammation was found in the duodenal bulb 5. The duodenal mucosa showed no abnormalities in the 2nd part of the duodenum  RECOMMENDATIONS: 1. Continue PPI infusion for 72 hours 2. Monitor hemoglobin closely, transfuse if necessary 3. No NSAIDs 4. Check H. pylori serum antibody and treat if positive 5. Recommend repeat endoscopy in 12 weeks to ensure healing 6. Hold Plavix   EGD 08/10/14: ENDOSCOPIC IMPRESSION: 1. The mucosa of the esophagus appeared normal 2. Large ulcer was found at the pylorus,  nonbleeding now, see above 3. Two small ulcers were found in the gastric antrum 4. Duodenal inflammation was found in the duodenal bulb 5. The duodenal mucosa showed no  abnormalities in the 2nd part of the duodenum  RECOMMENDATIONS: 1. Continue PPI drip 2. Monitor Hgb after transfusion, repeat transfusion if necessary 3. If brisk rebleeding may require IR intervention for embolization  Antibiotics:  none  HPI/Subjective: No abdominal pain or chest pain, is hungry will like to a.  Objective: Filed Vitals:   08/11/14 1011 08/11/14 1536 08/11/14 2122 08/12/14 0605  BP: 140/60 130/53 142/90 156/67  Pulse: 66 65 70 65  Temp: 97.9 F (36.6 C) 98.4 F (36.9 C) 99.1 F (37.3 C) 98.8 F (37.1 C)  TempSrc: Oral Oral Oral Oral  Resp: 20 20 20 20   Height:      Weight:      SpO2: 98% 96% 99% 99%    Intake/Output Summary (Last 24 hours) at 08/12/14 0734 Last data filed at 08/12/14 0500  Gross per 24 hour  Intake 2327.08 ml  Output    550 ml  Net 1777.08 ml   Filed Weights   08/09/14 1429  Weight: 92.987 kg (205 lb)    Exam:  General: Alert, awake, oriented x3, in no acute distress.  HEENT: No bruits, no goiter.  Heart: Regular rate and rhythm. Lungs: Good air movement, clear Abdomen: Soft, nontender, nondistended, positive bowel sounds.  Neuro: Grossly intact, nonfocal.   Data Reviewed: Basic Metabolic Panel:  Recent Labs Lab 08/09/14 0245 08/11/14 0525 08/12/14 0510  NA 138 140 138  K 4.0 3.4* 3.6  CL 102 110 109  CO2 28 24 25   GLUCOSE 129* 96 97  BUN 39* 18 12  CREATININE 1.54* 1.32 1.39*  CALCIUM 8.4 7.8* 7.7*   Liver Function Tests:  Recent Labs Lab 08/09/14 0245  AST 17  ALT 10  ALKPHOS 65  BILITOT 0.5  PROT 6.5  ALBUMIN 3.5    Recent Labs Lab 08/09/14 0245  LIPASE 29   No results for input(s): AMMONIA in the last 168 hours. CBC:  Recent Labs Lab 08/09/14 0245  08/09/14 2245 08/10/14 0532 08/10/14 2023 08/10/14 2357 08/11/14 0525 08/12/14 0510  WBC 11.3*  < > 9.5 8.5  --  9.1 7.2 6.6  NEUTROABS 9.8*  --   --   --   --   --   --   --   HGB 12.2*  < > 7.2* 6.6* 8.1* 7.9* 8.0* 7.6*  HCT  38.0*  < > 22.3* 20.7* 24.4* 24.3* 24.6* 23.5*  MCV 81.5  < > 81.7 82.5  --  84.7 84.5 84.8  PLT 244  < > 186 186  --  147* 141* 151  < > = values in this interval not displayed. Cardiac Enzymes:  Recent Labs Lab 08/09/14 1036 08/09/14 1648 08/09/14 2245  TROPONINI 0.09* 0.11* 0.09*   BNP (last 3 results) No results for input(s): BNP in the last 8760 hours.  ProBNP (last 3 results) No results for input(s): PROBNP in the last 8760 hours.  CBG: No results for input(s): GLUCAP in the last 168 hours.  No results found for this or any previous visit (from the past 240 hour(s)).   Studies: No results found.  Scheduled Meds: . antiseptic oral rinse  7 mL Mouth Rinse q12n4p  . chlorhexidine  15 mL Mouth Rinse BID  . sodium chloride  3 mL Intravenous Q12H   Continuous Infusions: . pantoprozole (PROTONIX) infusion 8  mg/hr (08/11/14 2121)    Time Spent: 30   Charlynne Cousins  [facility] Pager [phone]. If 7PM-7AM, please contact night-coverage at [email or web address], password [facility] 08/12/2014, 7:34 AM  LOS: 3 days

## 2014-08-13 LAB — CBC
HEMATOCRIT: 29.8 % — AB (ref 39.0–52.0)
Hemoglobin: 9.7 g/dL — ABNORMAL LOW (ref 13.0–17.0)
MCH: 27.3 pg (ref 26.0–34.0)
MCHC: 32.6 g/dL (ref 30.0–36.0)
MCV: 83.9 fL (ref 78.0–100.0)
Platelets: 164 10*3/uL (ref 150–400)
RBC: 3.55 MIL/uL — AB (ref 4.22–5.81)
RDW: 15.6 % — ABNORMAL HIGH (ref 11.5–15.5)
WBC: 7 10*3/uL (ref 4.0–10.5)

## 2014-08-13 LAB — TYPE AND SCREEN
ABO/RH(D): O POS
Antibody Screen: NEGATIVE
UNIT DIVISION: 0
Unit division: 0
Unit division: 0
Unit division: 0

## 2014-08-13 MED ORDER — AMLODIPINE BESYLATE 10 MG PO TABS
10.0000 mg | ORAL_TABLET | Freq: Every day | ORAL | Status: DC
  Administered 2014-08-13: 10 mg via ORAL
  Filled 2014-08-13: qty 1

## 2014-08-13 MED ORDER — METOPROLOL TARTRATE 50 MG PO TABS
50.0000 mg | ORAL_TABLET | Freq: Two times a day (BID) | ORAL | Status: DC
  Administered 2014-08-13: 50 mg via ORAL
  Filled 2014-08-13 (×2): qty 1

## 2014-08-13 MED ORDER — CLOPIDOGREL BISULFATE 75 MG PO TABS: 75.0000 mg | ORAL_TABLET | Freq: Every day | ORAL | Status: DC

## 2014-08-13 MED ORDER — SIMVASTATIN 20 MG PO TABS
20.0000 mg | ORAL_TABLET | Freq: Every day | ORAL | Status: DC
  Filled 2014-08-13: qty 1

## 2014-08-13 MED ORDER — ASPIRIN 81 MG PO TBEC: 81.0000 mg | DELAYED_RELEASE_TABLET | Freq: Every day | ORAL | Status: DC

## 2014-08-13 NOTE — Progress Notes (Signed)
[facility] PROGRESS NOTE Interim History: 64 year old male with history of PUD, prior GI bleeding, essential hypertension, CAD/MI, left vertebral artery occlusion, CVA on aspirin & Plavix, alcohol use (4 or 5 beers a week) and ongoing tobacco abuse presented with 3 episodes of hematemesis, 5 episodes of melena, dizziness and some chest discomfort. He took a packet of [employer] powder on day of admission. On admisison hemoglobin of 12.2. [facility] consulted and patient underwent EGD with submucosal injection of epinephrine of a pyloric ulcer. Hbg started to decline 4.20.2016 to 6.6 2, develop CP, 2 units of PRBC were transfused. Due to concern for ongoing GI bleed, he underwent second EGD on 4/20 which showed the larger nonbleeding pyloric ulcer and 2 small ulcers in the gastric antrum. Hbg drifted down again to 7.6 due to CAD/MI transfused additional 2 units of PRBC.  Assessment/Plan: Acute Upper GI bleeding/ Gastric ulcer with hemorrhage/Acute blood loss anemia - cont PPI orally. - S/p 2 additional untis of PRBC on 4.22.2016. - Keep hemoglobin > 9. As he has a known history of coronary artery disease/MI. - Tolerating diet,no black stools.  Thrombocytopenia: - Now resolved. - Likely due to acute blood loss anemia.  Essential hypertension: - Antihypertensive were held on admission. - Resume antihypertensive medications  Near syncope: - Likely due to acute blood loss anemia.  Tobacco abuse  Chest pain/Elevated troponins: - Likely demand ischemia from drop in hemoglobin - The setting of GI bleed, aspirin and Plavix have been on hold. - Consulted cardiology recommended an echocardiogram that showed an EF of 70% with no wall motion abnormality. - We'll try to keep hemoglobin greater than 9.  CKD (chronic kidney disease) stage 3, GFR 30-59 ml/min - Baseline creatinine probably in the 1.5 range - Creatinine 4/21:1.3  HypoCalcemia: When corrected for the albumin seems within  normal limits.    Hypokalemia  - Replace and monitor.  Alcohol abuse; - Signs of withdrawal.  History of CVA: Aspirin and Plavix on hold due to upper GI bleed.  Code Status: Full Family Communication: None at bedside Disposition Plan: DC home when medically stable- possibly 08/13/14  Consultants:  [facility]  Procedures:  EGD 08/09/14: ENDOSCOPIC IMPRESSION: 1. The mucosa of the esophagus appeared normal 2. Single ulcer measuring 10 x 25mm in size was found at the pylorus; Submucosal injection of 24ml of epinephrine 1:10,000 was performed around the bleeding site; Complete hemostasis was achieved by placing a single hemoclip on the bleeding site 3. Two small ulcers were found in the prepyloric region of the stomach 4. Duodenal inflammation was found in the duodenal bulb 5. The duodenal mucosa showed no abnormalities in the 2nd part of the duodenum  RECOMMENDATIONS: 1. Continue PPI infusion for 72 hours 2. Monitor hemoglobin closely, transfuse if necessary 3. No NSAIDs 4. Check H. pylori serum antibody and treat if positive 5. Recommend repeat endoscopy in 12 weeks to ensure healing 6. Hold Plavix   EGD 08/10/14: ENDOSCOPIC IMPRESSION: 1. The mucosa of the esophagus appeared normal 2. Large ulcer was found at the pylorus, nonbleeding now, see above 3. Two small ulcers were found in the gastric antrum 4. Duodenal inflammation was found in the duodenal bulb 5. The duodenal mucosa showed no abnormalities in the 2nd part of the duodenum  RECOMMENDATIONS: 1. Continue PPI drip 2. Monitor Hgb after transfusion, repeat transfusion if necessary 3. If brisk rebleeding may require IR intervention for embolization  Antibiotics:  none  HPI/Subjective: No complains  Objective: Filed Vitals:  08/12/14 1524 08/12/14 1840 08/12/14 2129 08/13/14 0528  BP: 136/62 161/74 163/74 155/79  Pulse: 69 68 67 63  Temp: 97.4 F (36.3 C) 98.3 F  (36.8 C) 98.8 F (37.1 C) 97.8 F (36.6 C)  TempSrc: Oral Oral Oral Oral  Resp: 20  20 20   Height:      Weight:      SpO2: 96% 99% 99% 99%    Intake/Output Summary (Last 24 hours) at 08/13/14 0819 Last data filed at 08/12/14 1850  Gross per 24 hour  Intake    825 ml  Output   1050 ml  Net   -225 ml   Filed Weights   08/09/14 1429  Weight: 92.987 kg (205 lb)    Exam:  General: Alert, awake, oriented x3, in no acute distress.  HEENT: No bruits, no goiter.  Heart: Regular rate and rhythm. Lungs: Good air movement, clear Abdomen: Soft, nontender, nondistended, positive bowel sounds.  Neuro: Grossly intact, nonfocal.   Data Reviewed: Basic Metabolic Panel:  Recent Labs Lab 08/09/14 0245 08/11/14 0525 08/12/14 0510  NA 138 140 138  K 4.0 3.4* 3.6  CL 102 110 109  CO2 28 24 25   GLUCOSE 129* 96 97  BUN 39* 18 12  CREATININE 1.54* 1.32 1.39*  CALCIUM 8.4 7.8* 7.7*   Liver Function Tests:  Recent Labs Lab 08/09/14 0245  AST 17  ALT 10  ALKPHOS 65  BILITOT 0.5  PROT 6.5  ALBUMIN 3.5    Recent Labs Lab 08/09/14 0245  LIPASE 29   No results for input(s): AMMONIA in the last 168 hours. CBC:  Recent Labs Lab 08/09/14 0245  08/10/14 0532 08/10/14 2023 08/10/14 2357 08/11/14 0525 08/12/14 0510 08/13/14 0644  WBC 11.3*  < > 8.5  --  9.1 7.2 6.6 7.0  NEUTROABS 9.8*  --   --   --   --   --   --   --   HGB 12.2*  < > 6.6* 8.1* 7.9* 8.0* 7.6* 9.7*  HCT 38.0*  < > 20.7* 24.4* 24.3* 24.6* 23.5* 29.8*  MCV 81.5  < > 82.5  --  84.7 84.5 84.8 83.9  PLT 244  < > 186  --  147* 141* 151 164  < > = values in this interval not displayed. Cardiac Enzymes:  Recent Labs Lab 08/09/14 1036 08/09/14 1648 08/09/14 2245  TROPONINI 0.09* 0.11* 0.09*   BNP (last 3 results) No results for input(s): BNP in the last 8760 hours.  ProBNP (last 3 results) No results for input(s): PROBNP in the last 8760 hours.  CBG: No results for input(s): GLUCAP in the last  168 hours.  No results found for this or any previous visit (from the past 240 hour(s)).   Studies: No results found.  Scheduled Meds: . antiseptic oral rinse  7 mL Mouth Rinse q12n4p  . chlorhexidine  15 mL Mouth Rinse BID  . pantoprazole  40 mg Oral BID  . sodium chloride  3 mL Intravenous Q12H   Continuous Infusions:    Time Spent: 30   Charlynne Cousins  [facility] Pager [phone]. If 7PM-7AM, please contact night-coverage at [email or web address], password [facility] 08/13/2014, 8:19 AM  LOS: 4 days

## 2014-08-13 NOTE — Discharge Summary (Signed)
Physician Discharge Summary  Luke Kaufman [id number] DOB: 10/07/1950 DOA: 08/09/2014  PCP: Wonda Cerise, MD  Admit date: 08/09/2014 Discharge date: 08/13/2014  Time spent: 25 minutes  Recommendations for Outpatient Follow-up:  1. Follow-up with GI in 2 weeks check a CBC. Can restart hemoglobin and Plavix.  Discharge Diagnoses:  Principal Problem:   Upper GI bleeding Active Problems:   Essential hypertension   Near syncope   CKD (chronic kidney disease) stage 3, GFR 30-59 ml/min   Tobacco abuse   Gastric ulcer with hemorrhage   Acute blood loss anemia   Thrombocytopenia   Hypokalemia   Discharge Condition: stable  Diet recommendation: regular  Filed Weights   08/09/14 1429  Weight: 92.987 kg (205 lb)    History of present illness:  64 year old male with a history of hypertension, peptic ulcer disease, stroke, left vertebral artery occlusion, MI presented with one-day history of hematemesis. The patient stated that he had 3 episodes of hematemesis starting at 3 AM on the morning of admission. The patient also had an episode of melanotic stools at home. He complained of some chest discomfort with dizziness during the episodes of hematemesis. The patient denies any regular NSAID use although took one [facility] powder this am at home. The patient denies any liver drug use, but states that he drinks approximately 5 beers per week. He denies any other liquor or wine. He smokes one pack per day and has smoked for nearly 50 years. Presently, he denies any dizziness, chest discomfort, shortness of breath. He states that he had an endoscopy in December 2009 in [facility] which revealed "ulcers". However, the patient is not on a PPI as noted from his MAR. The patient takes aspirin and Plavix for history of stroke. He denies any visual disturbance, speech disturbance, focal weakness, abdominal pain, dysuria, hematuria, hematochezia. In the emergency department, the patient had a melanotic  stool without bright red blood.   Hospital Course:  Acute Upper GI bleeding/ Gastric ulcer with hemorrhage/Acute blood loss anemia - Started on IV PPI, aspirin and Plavix discontinued, consulted GI recommended an EGD - Status post EGD on 08/09/2014 with submucosal injection with epinephrine of a large pyloric ulcer. Status post 2 units of packed red blood cells on 08/10/2014 - Patient had a 2 g drop of his hemoglobin along with dark red stools on rectal exam 08/10/2014, underwent repeated EGD on 4.20 that showed a large nonbleeding pyloric ulcer and 2 small gastric antrum ulcer. - Hemoglobin drop on 08/12/2014 to 6.6,  transfuse 2 units of packed red blood cells. - Hbg remained stable. - Will restart ASA and plavix in 2 weeks. Follow up wit GI as an outpatient.  Thrombocytopenia: - Now resolved. - Likely due to acute blood loss anemia.  Essential hypertension: - no changes made to his antihypertensive medications.  Near syncope: - Likely due to acute blood loss anemia.  Tobacco abuse  Chest pain/Elevated troponins: - Likely demand ischemia from dropping hemoglobin - The setting of GI bleed, aspirin and Plavix have been on hold. - Consulted cardiology recommended an echocardiogram that showed an EF of 70% with no wall motion abnormality. - We'll try to keep hemoglobin greater than 9.  CKD (chronic kidney disease) stage 3, GFR 30-59 ml/min - Baseline creatinine probably in the 1.5 range  HypoCalcemia: When corrected for the albumin seems within normal limits.   Hypokalemia - Replace and monitor.  Alcohol abuse; - No signs of withdrawal.  History of CVA: Aspirin and Plavix on  hold due to upper GI bleed.   Procedures:  EGD x 2 as above.  Consultations:  GI  cardiology  Discharge Exam: Filed Vitals:   08/13/14 0528  BP: 155/79  Pulse: 63  Temp: 97.8 F (36.6 C)  Resp: 20    General: See progress note  Discharge Instructions   Discharge Instructions     Diet - low sodium heart healthy    Complete by:  As directed      Increase activity slowly    Complete by:  As directed           Current Discharge Medication List    CONTINUE these medications which have CHANGED   Details  aspirin 81 MG EC tablet Take 1 tablet (81 mg total) by mouth daily. Qty: 30 tablet, Refills: 0    clopidogrel (PLAVIX) 75 MG tablet Take 1 tablet (75 mg total) by mouth daily with breakfast. Qty: 30 tablet, Refills: 0      CONTINUE these medications which have NOT CHANGED   Details  amLODipine (NORVASC) 10 MG tablet Take 1 tablet (10 mg total) by mouth daily. Qty: 30 tablet, Refills: 0    metoprolol (LOPRESSOR) 50 MG tablet Take 50 mg by mouth 2 (two) times daily.    simvastatin (ZOCOR) 20 MG tablet Take 1 tablet (20 mg total) by mouth daily at 6 PM. Qty: 30 tablet, Refills: 0    nicotine (NICODERM CQ - DOSED IN MG/24 HOURS) 14 mg/24hr patch Place 1 patch (14 mg total) onto the skin daily. Qty: 28 patch, Refills: 0       No Known Allergies Follow-up Information    Follow up with Tye Savoy, NP On 08/23/2014.   Specialty:  Nurse Practitioner   Why:  appt 1:30--be there 1:15. Please go to the lab on the basement floor of the [place] building Mon May 2 for blood work.   Contact information:   [address] [place] [address] [phone]        The results of significant diagnostics from this hospitalization (including imaging, microbiology, ancillary and laboratory) are listed below for reference.    Significant Diagnostic Studies: No results found.  Microbiology: No results found for this or any previous visit (from the past 240 hour(s)).   Labs: Basic Metabolic Panel:  Recent Labs Lab 08/09/14 0245 08/11/14 0525 08/12/14 0510  NA 138 140 138  K 4.0 3.4* 3.6  CL 102 110 109  CO2 28 24 25   GLUCOSE 129* 96 97  BUN 39* 18 12  CREATININE 1.54* 1.32 1.39*  CALCIUM 8.4 7.8* 7.7*   Liver Function Tests:  Recent Labs Lab  08/09/14 0245  AST 17  ALT 10  ALKPHOS 65  BILITOT 0.5  PROT 6.5  ALBUMIN 3.5    Recent Labs Lab 08/09/14 0245  LIPASE 29   No results for input(s): AMMONIA in the last 168 hours. CBC:  Recent Labs Lab 08/09/14 0245  08/10/14 0532 08/10/14 2023 08/10/14 2357 08/11/14 0525 08/12/14 0510 08/13/14 0644  WBC 11.3*  < > 8.5  --  9.1 7.2 6.6 7.0  NEUTROABS 9.8*  --   --   --   --   --   --   --   HGB 12.2*  < > 6.6* 8.1* 7.9* 8.0* 7.6* 9.7*  HCT 38.0*  < > 20.7* 24.4* 24.3* 24.6* 23.5* 29.8*  MCV 81.5  < > 82.5  --  84.7 84.5 84.8 83.9  PLT 244  < >  186  --  147* 141* 151 164  < > = values in this interval not displayed. Cardiac Enzymes:  Recent Labs Lab 08/09/14 1036 08/09/14 1648 08/09/14 2245  TROPONINI 0.09* 0.11* 0.09*   BNP: BNP (last 3 results) No results for input(s): BNP in the last 8760 hours.  ProBNP (last 3 results) No results for input(s): PROBNP in the last 8760 hours.  CBG: No results for input(s): GLUCAP in the last 168 hours.     Signed:  Charlynne Cousins  [facility] 08/13/2014, 8:31 AM

## 2014-08-13 NOTE — Progress Notes (Signed)
Pt VSS. Pt IV removed. Tele d/c. Reviewed d/c instructions at bedside, no further questions. Kizzie Ide, RN

## 2014-08-15 ENCOUNTER — Other Ambulatory Visit: Payer: Self-pay

## 2014-08-15 ENCOUNTER — Telehealth: Payer: Self-pay | Admitting: *Deleted

## 2014-08-15 LAB — H. PYLORI ANTIBODY, IGG: H Pylori IgG: 4.2

## 2014-08-15 MED ORDER — AMOXICILL-CLARITHRO-LANSOPRAZ PO MISC: Freq: Two times a day (BID) | ORAL | Status: DC

## 2014-08-15 NOTE — Telephone Encounter (Signed)
Prior authorization request has been placed through [email or web address] for patient's prevpac. We are awaiting response from insurance company.

## 2014-08-16 MED ORDER — CLARITHROMYCIN 500 MG PO TABS: 500.0000 mg | ORAL_TABLET | Freq: Two times a day (BID) | ORAL | Status: DC

## 2014-08-16 MED ORDER — AMOXICILLIN 500 MG PO TABS: 1000.0000 mg | ORAL_TABLET | Freq: Two times a day (BID) | ORAL | Status: DC

## 2014-08-16 MED ORDER — PANTOPRAZOLE SODIUM 40 MG PO TBEC: 40.0000 mg | DELAYED_RELEASE_TABLET | Freq: Two times a day (BID) | ORAL | Status: DC

## 2014-08-16 NOTE — Telephone Encounter (Signed)
The issue with plavix is the omeprazole Please try to RX in 3 separate meds Amox 1g BID Clarithro 500 BID Pantoprazole 40 mg BID

## 2014-08-16 NOTE — Addendum Note (Signed)
Addended by: Larina Bras on: 08/16/2014 01:46 PM   Modules accepted: Orders, Medications

## 2014-08-16 NOTE — Telephone Encounter (Signed)
Patient's insurance has denied authorization for prevpac [phone]). They will cover omeclamox-Pak. Please advise.Marland KitchenMarland KitchenMarland Kitchen

## 2014-08-16 NOTE — Telephone Encounter (Addendum)
Dr Hilarie Fredrickson- I get the following when trying to rx omeclamox.... Please advise.....    Drug-Drug Interaction Report Macrolides and Ketolides / Statins  Significance: Major  Warning: Pharmacologic and toxic effects of simvastatin may be increased by Amoxicill-Clarithro-Omeprazole. Coadministration of Amoxicill-Clarithro-Omeprazole and simvastatin may increase the risk of liver dysfunction and rhabdomyolysis. Coadministration may be contraindicated or not recommended in official package labeling, depending on the specific agents involved.  Onset: Delayed Document Level: Probable  Interacting Medications/Orders:  Macrolides and Ketolides Oral or Non-Oral, Systemic Statins Oral or Non-Oral, Systemic  1. Amoxicill-Clarithro-Omeprazole Order: Amoxicill-Clarithro-Omeprazole [id number] MG MISC Route: none Start: 08/16/2014 End: none Frequency:  1. simvastatin Order ([id number]): simvastatin (ZOCOR) 20 MG tablet Route: Oral Start: 05/19/2013 End: none Frequency: Daily-1800   Management Code: Professional intervention required  Effects: Amoxicill-Clarithro-Omeprazole may increase pharmacologic effects of simvastatin. Elevated plasma concentrations with toxicity characterized by liver enzyme elevation and myopathy may occur.  ___________________________________________________________________________________________________________________________________________________________________________________ Drug-Drug Interaction Report Proton Pump Inhibitors / Clopidogrel  Significance: Major  Warning: Use of Amoxicill-Clarithro-Omeprazole may lead to reduced ability of clopidogrel to inhibit platelet aggregation and increase the risk of subsequent cardiovascular events. Coadministration of Amoxicill-Clarithro-Omeprazole and clopidogrel should be avoided according to the official package labeling of clopidogrel. However, according to an expert consensus document, the benefits of  Amoxicill-Clarithro-Omeprazole may outweigh the risk of the potential reduction in cardiovascular efficacy in patients receiving clopidogrel who are at high risk for gastrointestinal bleeding.  Onset: Delayed Document Level: Possible  Interacting Medications/Orders:  Proton Pump Inhibitors Oral or Non-Oral, Systemic Clopidogrel Oral, Systemic  2. Amoxicill-Clarithro-Omeprazole Order: Amoxicill-Clarithro-Omeprazole (OMECLAMOX-PAK) [id number] MG MISC Route: none Start: 08/16/2014 End: none Frequency:  2. clopidogrel Order ([id number]): clopidogrel (PLAVIX) 75 MG tablet Route: Oral Start: 08/27/2014 End: none Frequency: Daily with breakfast   Management Code: Professional review suggested  Effects: Use of Amoxicill-Clarithro-Omeprazole may lead to reduced ability of clopidogrel to inhibit platelet aggregation and increase the risk of subsequent cardiovascular events. Coadministration of Amoxicill-Clarithro-Omeprazole and clopidogrel should be avoided according to the official package labeling of clopidogrel. However, according to an expert consensus document, the benefits of Amoxicill-Clarithro-Omeprazole may outweigh the risk of the potential reduction in cardiovascular efficacy in patients receiving clopidogrel who are at high risk for gastrointestinal bleeding.

## 2014-08-16 NOTE — Telephone Encounter (Signed)
How about the anticoagulant?

## 2014-08-16 NOTE — Telephone Encounter (Signed)
That substitution is fine

## 2014-08-16 NOTE — Telephone Encounter (Signed)
Hold STATIN while taking h pylori antibiotics and for 1 week afterward Then can resume Thanks

## 2014-08-16 NOTE — Telephone Encounter (Signed)
I have d/c'ed script for Prevpac with [facility] due to insurance denial. I have also sent scripts for amoxicillin, clarithromycin and pantoprazole in it's place. I have asked patient to call back to discuss. Patient will still need to hold statin while taking antibiotics and for 1 week after that.

## 2014-08-16 NOTE — Addendum Note (Signed)
Addended by: Larina Bras on: 08/16/2014 04:16 PM   Modules accepted: Orders

## 2014-08-17 NOTE — Telephone Encounter (Signed)
Left message for patient to call back  

## 2014-08-18 NOTE — Telephone Encounter (Signed)
Patient has been advised to hold statin medications while on antibiotics and for 1 week afterwards due to possible interaction if taken with antibiotics. Patient verbalizes understanding.

## 2014-08-23 ENCOUNTER — Encounter: Payer: Self-pay | Admitting: Nurse Practitioner

## 2014-08-23 ENCOUNTER — Other Ambulatory Visit (INDEPENDENT_AMBULATORY_CARE_PROVIDER_SITE_OTHER): Payer: 59

## 2014-08-23 ENCOUNTER — Ambulatory Visit (INDEPENDENT_AMBULATORY_CARE_PROVIDER_SITE_OTHER): Payer: 59 | Admitting: Nurse Practitioner

## 2014-08-23 VITALS — BP 150/78 | HR 72 | Ht 67.0 in | Wt 215.4 lb

## 2014-08-23 DIAGNOSIS — K250 Acute gastric ulcer with hemorrhage: Secondary | ICD-10-CM

## 2014-08-23 DIAGNOSIS — D62 Acute posthemorrhagic anemia: Secondary | ICD-10-CM

## 2014-08-23 DIAGNOSIS — D649 Anemia, unspecified: Secondary | ICD-10-CM

## 2014-08-23 DIAGNOSIS — K279 Peptic ulcer, site unspecified, unspecified as acute or chronic, without hemorrhage or perforation: Secondary | ICD-10-CM | POA: Diagnosis not present

## 2014-08-23 LAB — CBC WITH DIFFERENTIAL/PLATELET
BASOS ABS: 0.1 10*3/uL (ref 0.0–0.1)
Basophils Relative: 0.8 % (ref 0.0–3.0)
EOS ABS: 0.2 10*3/uL (ref 0.0–0.7)
Eosinophils Relative: 2.1 % (ref 0.0–5.0)
HEMATOCRIT: 37.3 % — AB (ref 39.0–52.0)
HEMOGLOBIN: 12.4 g/dL — AB (ref 13.0–17.0)
LYMPHS ABS: 1.1 10*3/uL (ref 0.7–4.0)
Lymphocytes Relative: 11.7 % — ABNORMAL LOW (ref 12.0–46.0)
MCHC: 33.3 g/dL (ref 30.0–36.0)
MCV: 80.1 fl (ref 78.0–100.0)
Monocytes Absolute: 0.6 10*3/uL (ref 0.1–1.0)
Monocytes Relative: 6.1 % (ref 3.0–12.0)
Neutro Abs: 7.7 10*3/uL (ref 1.4–7.7)
Neutrophils Relative %: 79.3 % — ABNORMAL HIGH (ref 43.0–77.0)
Platelets: 374 10*3/uL (ref 150.0–400.0)
RBC: 4.67 Mil/uL (ref 4.22–5.81)
RDW: 16.9 % — AB (ref 11.5–15.5)
WBC: 9.7 10*3/uL (ref 4.0–10.5)

## 2014-08-23 NOTE — Patient Instructions (Addendum)
Your physician has requested that you go to the basement for the following lab work before leaving today:  CBC; Stool equipment.  Continue taking your Protonix twice a day for 2 more weeks and then begin taking it once daily.  You can restart baby aspirin if your doctor wants you on it.  Please bring your H pylori stool test back in one month

## 2014-08-23 NOTE — Progress Notes (Addendum)
History of Present Illness:   Patient is a 64 year old male hospitalized a couple of weeks ago with an upper GI bleed on aspirin and Plavix. Inpatient EGD by Dr. Hilarie Fredrickson revealed two small nonbleeding clean-based ulcers in the prepyloric region and an.actively oozing ulcer with a visible vessel at the pylorus. Area was injected and a hemoclip placed.  On the second hospital day patient had recurrent bleeding with drop in hgb. Repeat EGD again showed a large ulcer at the pylorus. The Hemoclip was no longer present but the lesion was not bleeding.   Patient was transfused 2 units of blood with rise in hgb from 7.6 to 9.7.  H. Pylori antibody was positive, patient is on amoxicillin, clarithromycin and twice daily PPI. No further bleeding. Patient feels well. He restarted Plavix the day after hospital discharge 08/13/14.    Current Medications, Allergies, Past Medical History, Past Surgical History, Family History and Social History were reviewed in [employer] record.   Physical Exam: General: Pleasant, well developed , white male in no acute distress Head: Normocephalic and atraumatic Eyes:  sclerae anicteric, conjunctiva pink  Ears: Normal auditory acuity Lungs: Clear throughout to auscultation Heart: Regular rate and rhythm Abdomen: Soft, non distended, non-tender. No masses, no hepatomegaly. Normal bowel sounds Musculoskeletal: Symmetrical with no gross deformities  Extremities: No edema  Neurological: Alert oriented x 4, grossly nonfocal Psychological:  Alert and cooperative. Normal mood and affect  Assessment and Recommendations:   50.  64 year old male with recent upper GI bleed on Plavix / ASA.  Gastric ulcer with visible vessel on EGD, status post  epi injection and clip placement.  Patient required a second inpatient procedure for recurrent bleeding /anemia but no active bleeding seen.  H. Pylori IgG positive, patient states he also had a "bacteria in  stomach"  years ago with another ulcer so antibiotics could still be positive vrs recurrent infection. He was also on twice daily aspirin. No further bleeding, patient is back on Plavix. He has not resumed aspirin (no one told him when to).  -Continue twice daily PPI for 2 more weeks then reduce to once daily.  -Can restart daily baby aspirin if PCP/cardiology desires.  -Patient will return in a couple weeks after completion of antibiotics so we can check an H. Pylori stool antigen given history of recurrent bleeding ulcers.  -Will check a CBC . Hopefully hgb improving, if not then consider oral iron.   2. Colon cancer screening. Apparently patient had a colonoscopy in [place] in 2015. Patient wants [place] to assume his GI care as he lives closer to [facility] than [employer].    3. Tobacco abuse. Patient tells me he has failed efforts to stop   Addendum: Reviewed and agree with management. Obtain copy of colonoscopy from [place] MD Closely monitor Hgb to ensure improvement, replace iron if needed Agree with confirmation of eradication with stool Ag in about 1 month Jerene Bears, MD     Records from [facility]:   Patient admitted to [facility] March 2015 for anemia on asa / plavix. Inpatient EGD 06/23/13 - some edema in pylorus, one erosion in bulb. No biopsies done. Colonoscopy with TI intubation up to 10cm.  Good prep, Hemorrhoids found, exam otherwise negative.   According to hospital consult note ptient had been hospitalized 2009 for melena / anemia. EGD at that time revealed a duodenal ulcer with visible vessel. Bx were positive for H.pylori. Compliance with treatment  was questionable.  Will scan reports into EPIC

## 2014-08-24 DIAGNOSIS — D62 Acute posthemorrhagic anemia: Secondary | ICD-10-CM | POA: Insufficient documentation

## 2014-10-19 ENCOUNTER — Telehealth: Payer: Self-pay

## 2014-10-19 ENCOUNTER — Other Ambulatory Visit: Payer: Self-pay

## 2014-10-19 DIAGNOSIS — A048 Other specified bacterial intestinal infections: Secondary | ICD-10-CM

## 2014-10-19 NOTE — Telephone Encounter (Signed)
Pt knows to come to the lab.

## 2014-10-19 NOTE — Telephone Encounter (Signed)
-----   Message from Algernon Huxley, RN sent at 08/16/2014 10:28 AM EDT ----- Regarding: h pylori stool test Pt needs h pylori stool test, order in epic.

## 2014-10-28 ENCOUNTER — Other Ambulatory Visit: Payer: 59

## 2014-10-28 DIAGNOSIS — B9681 Helicobacter pylori [H. pylori] as the cause of diseases classified elsewhere: Secondary | ICD-10-CM

## 2014-10-28 DIAGNOSIS — K253 Acute gastric ulcer without hemorrhage or perforation: Principal | ICD-10-CM

## 2014-10-29 LAB — HELICOBACTER PYLORI  SPECIAL ANTIGEN: H. PYLORI ANTIGEN STOOL: NEGATIVE

## 2015-01-11 ENCOUNTER — Encounter (HOSPITAL_COMMUNITY): Payer: Self-pay | Admitting: *Deleted

## 2015-01-11 ENCOUNTER — Emergency Department (HOSPITAL_COMMUNITY)
Admission: EM | Admit: 2015-01-11 | Discharge: 2015-01-12 | Disposition: A | Discharge status: Home / Self Care | Payer: 59 | Attending: Emergency Medicine | Admitting: Emergency Medicine

## 2015-01-11 ENCOUNTER — Emergency Department (HOSPITAL_COMMUNITY): Payer: 59

## 2015-01-11 DIAGNOSIS — E785 Hyperlipidemia, unspecified: Secondary | ICD-10-CM | POA: Insufficient documentation

## 2015-01-11 DIAGNOSIS — I252 Old myocardial infarction: Secondary | ICD-10-CM | POA: Insufficient documentation

## 2015-01-11 DIAGNOSIS — I10 Essential (primary) hypertension: Secondary | ICD-10-CM | POA: Diagnosis not present

## 2015-01-11 DIAGNOSIS — Z720 Tobacco use: Secondary | ICD-10-CM | POA: Diagnosis not present

## 2015-01-11 DIAGNOSIS — Z79899 Other long term (current) drug therapy: Secondary | ICD-10-CM | POA: Diagnosis not present

## 2015-01-11 DIAGNOSIS — R001 Bradycardia, unspecified: Secondary | ICD-10-CM | POA: Diagnosis not present

## 2015-01-11 DIAGNOSIS — Z792 Long term (current) use of antibiotics: Secondary | ICD-10-CM | POA: Insufficient documentation

## 2015-01-11 DIAGNOSIS — R42 Dizziness and giddiness: Secondary | ICD-10-CM | POA: Diagnosis not present

## 2015-01-11 DIAGNOSIS — Z8673 Personal history of transient ischemic attack (TIA), and cerebral infarction without residual deficits: Secondary | ICD-10-CM | POA: Insufficient documentation

## 2015-01-11 DIAGNOSIS — R0602 Shortness of breath: Secondary | ICD-10-CM | POA: Insufficient documentation

## 2015-01-11 DIAGNOSIS — Z8719 Personal history of other diseases of the digestive system: Secondary | ICD-10-CM | POA: Diagnosis not present

## 2015-01-11 DIAGNOSIS — R55 Syncope and collapse: Secondary | ICD-10-CM | POA: Insufficient documentation

## 2015-01-11 HISTORY — DX: Acute myocardial infarction, unspecified: I21.9

## 2015-01-11 LAB — CBC
HCT: 40.9 % (ref 39.0–52.0)
Hemoglobin: 13.2 g/dL (ref 13.0–17.0)
MCH: 24.6 pg — ABNORMAL LOW (ref 26.0–34.0)
MCHC: 32.3 g/dL (ref 30.0–36.0)
MCV: 76.2 fL — ABNORMAL LOW (ref 78.0–100.0)
Platelets: 244 10*3/uL (ref 150–400)
RBC: 5.37 MIL/uL (ref 4.22–5.81)
RDW: 18.1 % — ABNORMAL HIGH (ref 11.5–15.5)
WBC: 12 10*3/uL — ABNORMAL HIGH (ref 4.0–10.5)

## 2015-01-11 LAB — BASIC METABOLIC PANEL
Anion gap: 9 (ref 5–15)
BUN: 17 mg/dL (ref 6–20)
CO2: 24 mmol/L (ref 22–32)
Calcium: 8.9 mg/dL (ref 8.9–10.3)
Chloride: 105 mmol/L (ref 101–111)
Creatinine, Ser: 1.35 mg/dL — ABNORMAL HIGH (ref 0.61–1.24)
GFR calc Af Amer: >60 mL/min (ref >60)
GFR calc non Af Amer: 54 mL/min — ABNORMAL LOW (ref >60)
Glucose, Bld: 121 mg/dL — ABNORMAL HIGH (ref 65–99)
Potassium: 4.5 mmol/L (ref 3.5–5.1)
Sodium: 138 mmol/L (ref 135–145)

## 2015-01-11 LAB — I-STAT TROPONIN, ED: Troponin i, poc: 0 ng/mL (ref 0.00–0.08)

## 2015-01-11 NOTE — ED Notes (Signed)
Pt taken to xray 

## 2015-01-11 NOTE — ED Provider Notes (Signed)
CSN: [id number]     Arrival date & time 01/11/15  2215 History  This chart was scribed for Virgel Manifold, MD by Hansel Feinstein, ED Scribe. This patient was seen in room A02C/A02C and the patient's care was started at 11:08 PM.     No chief complaint on file.  The history is provided by the patient. No language interpreter was used.    HPI Comments: Sipriano Cancel is a 64 y.o. male with hx of HTN, TIA, HLD, MI, gastric ulcer who presents to the Emergency Department complaining of 2 episodes of syncope onset today with associated lightheadedness. Pt reports that he lost consciousness from a standing position when he felt off-balanced while making coffee. He notes that he was able to get up after the syncope but felt lightheaded, which was relieved after sitting down. He notes that he felt no symptoms prior to tonight. He also notes a change in the color of his stool to a beige color. His Cardiologist is with [facility] in [employer]. He has been taking his medications regularly. No new medications. He denies any pains, new SOB, fevers, chills, melena, hematochezia.   Past Medical History  Diagnosis Date  . Hypertension   . Gastric ulcer   . Stroke   . Tobacco abuse   . Hyperlipemia   . MI (myocardial infarction)    Past Surgical History  Procedure Laterality Date  . Esophagogastroduodenoscopy N/A 08/09/2014    Procedure: ESOPHAGOGASTRODUODENOSCOPY (EGD);  Surgeon: Jerene Bears, MD;  Location: [facility];  Service: Gastroenterology;  Laterality: N/A;  . Esophagogastroduodenoscopy N/A 08/10/2014    Procedure: ESOPHAGOGASTRODUODENOSCOPY (EGD);  Surgeon: Jerene Bears, MD;  Location: [facility];  Service: Gastroenterology;  Laterality: N/A;   Family History  Problem Relation Age of Onset  . Pancreatic cancer Mother   . Heart attack Father 32   Social History  Substance Use Topics  . Smoking status: Current Every Day Smoker -- 1.50 packs/day    Types: Cigarettes  . Smokeless  tobacco: Never Used     Comment: He was taking Chantix but had nightmares.  . Alcohol Use: 0.0 oz/week    0 Standard drinks or equivalent per week     Comment: occasional    Review of Systems  Constitutional: Negative for fever and chills.  Respiratory: Positive for shortness of breath ( baseline).   Gastrointestinal: Negative for blood in stool.  Musculoskeletal: Negative for arthralgias.  Neurological: Positive for syncope and light-headedness.  All other systems reviewed and are negative.  Allergies  Review of patient's allergies indicates no known allergies.  Home Medications   Prior to Admission medications   Medication Sig Start Date End Date Taking? Authorizing Provider  amLODipine (NORVASC) 10 MG tablet Take 1 tablet (10 mg total) by mouth daily. 05/19/13  Yes Thurnell Lose, MD  clopidogrel (PLAVIX) 75 MG tablet Take 1 tablet (75 mg total) by mouth daily with breakfast. 08/27/14  Yes Charlynne Cousins, MD  metoprolol (LOPRESSOR) 50 MG tablet Take 50 mg by mouth 2 (two) times daily.   Yes Historical Provider, MD  pantoprazole (PROTONIX) 40 MG tablet Take 1 tablet (40 mg total) by mouth 2 (two) times daily. Patient taking differently: Take 40 mg by mouth daily.  08/16/14  Yes Jerene Bears, MD  simvastatin (ZOCOR) 40 MG tablet Take 40 mg by mouth every evening. 12/19/14  Yes Historical Provider, MD  amoxicillin (AMOXIL) 500 MG tablet Take 2 tablets (1,000 mg total) by mouth 2 (  two) times daily. 08/16/14   Jerene Bears, MD  clarithromycin (BIAXIN) 500 MG tablet Take 1 tablet (500 mg total) by mouth 2 (two) times daily. 08/16/14   Jerene Bears, MD  simvastatin (ZOCOR) 20 MG tablet Take 1 tablet (20 mg total) by mouth daily at 6 PM. 05/19/13   Thurnell Lose, MD   BP 136/63 mmHg  Pulse 52  Temp(Src) 98.2 F (36.8 C) (Oral)  Resp 16  Ht 5' 7.5" (1.715 m)  Wt 230 lb (104.327 kg)  BMI 35.47 kg/m2  SpO2 97% Physical Exam  Constitutional: He appears well-developed and  well-nourished. No distress.  HENT:  Head: Normocephalic and atraumatic.  Mouth/Throat: Oropharynx is clear and moist. No oropharyngeal exudate.  Eyes: Conjunctivae and EOM are normal. Pupils are equal, round, and reactive to light. Right eye exhibits no discharge. Left eye exhibits no discharge. No scleral icterus.  Neck: Normal range of motion. Neck supple. No JVD present. No thyromegaly present.  Cardiovascular: Regular rhythm, normal heart sounds and intact distal pulses.  Exam reveals no gallop and no friction rub.   No murmur heard. Mild bradycardia  Pulmonary/Chest: Effort normal and breath sounds normal. No respiratory distress. He has no wheezes. He has no rales.  Abdominal: Soft. Bowel sounds are normal. He exhibits no distension and no mass. There is no tenderness.  Musculoskeletal: Normal range of motion. He exhibits no edema or tenderness.  No calf tenderness.   Lymphadenopathy:    He has no cervical adenopathy.  Neurological: He is alert. Coordination normal.  Skin: Skin is warm and dry. No rash noted. No erythema.  Psychiatric: He has a normal mood and affect. His behavior is normal.  Nursing note and vitals reviewed.  ED Course  Procedures (including critical care time) DIAGNOSTIC STUDIES: Oxygen Saturation is 97% on RA, normal by my interpretation.    COORDINATION OF CARE: 11:14 PM Discussed treatment plan with pt at bedside and pt agreed to plan.   Labs Review Labs Reviewed  BASIC METABOLIC PANEL - Abnormal; Notable for the following:    Glucose, Bld 121 (*)    Creatinine, Ser 1.35 (*)    GFR calc non Af Amer 54 (*)    All other components within normal limits  CBC - Abnormal; Notable for the following:    WBC 12.0 (*)    MCV 76.2 (*)    MCH 24.6 (*)    RDW 18.1 (*)    All other components within normal limits  URINALYSIS, ROUTINE W REFLEX MICROSCOPIC (NOT AT [facility])  I-STAT TROPOININ, ED    Imaging Review No results found. I have personally reviewed and  evaluated these images and lab results as part of my medical decision-making.   EKG Interpretation   Date/Time:  Wednesday January 11 2015 22:25:18 EDT Ventricular Rate:  50 PR Interval:  205 QRS Duration: 80 QT Interval:  459 QTC Calculation: 419 R Axis:   -10 Text Interpretation:  Sinus rhythm Non-specific ST-t changes Confirmed by  Wilson Singer  MD, STEPHEN ([id number]) on 01/12/2015 12:18:14 AM      MDM   Final diagnoses:  Near syncope    64yM with pre-syncope. Associated with changes in position. None with exertion. No pain, dyspnea, palpitations, diaphoresis, nausea, etc. Some bradycardia noted in ED. High 40/slow 50s. On beta blocker. Not sure if contributory. Will have decrease to 25mg  BID for a couple days and see if any improvement in symptoms. Low suspicion for emergent process. Needs to follow-up with PCP  or cardiologist. Return precautions discussed.   I personally preformed the services scribed in my presence. The recorded information has been reviewed is accurate. Virgel Manifold, MD.    Virgel Manifold, MD 01/12/15 [phone]

## 2015-01-11 NOTE — ED Notes (Signed)
Pt to ED via [facility] c/o generalized weakness and near syncope x 2 for the past week. Pt reports standing from lying position, felt nausea and lightheaded, then laid back on the couch. Hx of "bleeding ulcer" and blood transfusions in the past. Pt noted to be bradycardic with a rate of 50

## 2015-01-12 NOTE — Discharge Instructions (Signed)
Decrease your metoprolol to 25mg  twice a day for 3 days. Your heart rate is a little low and this may be contributing to your symptoms. Follow-up with your PCP or cardiologist to discuss further.

## 2015-04-10 DIAGNOSIS — I252 Old myocardial infarction: Secondary | ICD-10-CM | POA: Insufficient documentation

## 2015-04-10 DIAGNOSIS — M25569 Pain in unspecified knee: Secondary | ICD-10-CM | POA: Insufficient documentation

## 2015-04-10 DIAGNOSIS — H269 Unspecified cataract: Secondary | ICD-10-CM | POA: Insufficient documentation

## 2015-04-10 DIAGNOSIS — I708 Atherosclerosis of other arteries: Secondary | ICD-10-CM | POA: Insufficient documentation

## 2015-04-10 DIAGNOSIS — I701 Atherosclerosis of renal artery: Secondary | ICD-10-CM | POA: Insufficient documentation

## 2015-04-10 DIAGNOSIS — I7090 Unspecified atherosclerosis: Secondary | ICD-10-CM | POA: Insufficient documentation

## 2015-04-10 DIAGNOSIS — Z8673 Personal history of transient ischemic attack (TIA), and cerebral infarction without residual deficits: Secondary | ICD-10-CM | POA: Insufficient documentation

## 2015-04-10 DIAGNOSIS — E785 Hyperlipidemia, unspecified: Secondary | ICD-10-CM | POA: Insufficient documentation

## 2015-04-22 ENCOUNTER — Emergency Department (HOSPITAL_COMMUNITY)
Admission: EM | Admit: 2015-04-22 | Discharge: 2015-04-23 | Disposition: A | Discharge status: Home / Self Care | Payer: 59 | Attending: Emergency Medicine | Admitting: Emergency Medicine

## 2015-04-22 ENCOUNTER — Emergency Department (HOSPITAL_COMMUNITY): Payer: 59

## 2015-04-22 ENCOUNTER — Encounter (HOSPITAL_COMMUNITY): Payer: Self-pay

## 2015-04-22 DIAGNOSIS — Z8673 Personal history of transient ischemic attack (TIA), and cerebral infarction without residual deficits: Secondary | ICD-10-CM | POA: Diagnosis not present

## 2015-04-22 DIAGNOSIS — Z79899 Other long term (current) drug therapy: Secondary | ICD-10-CM | POA: Insufficient documentation

## 2015-04-22 DIAGNOSIS — Z792 Long term (current) use of antibiotics: Secondary | ICD-10-CM | POA: Diagnosis not present

## 2015-04-22 DIAGNOSIS — Z7982 Long term (current) use of aspirin: Secondary | ICD-10-CM | POA: Insufficient documentation

## 2015-04-22 DIAGNOSIS — R0902 Hypoxemia: Secondary | ICD-10-CM | POA: Diagnosis not present

## 2015-04-22 DIAGNOSIS — R0602 Shortness of breath: Secondary | ICD-10-CM | POA: Diagnosis present

## 2015-04-22 DIAGNOSIS — I10 Essential (primary) hypertension: Secondary | ICD-10-CM | POA: Diagnosis not present

## 2015-04-22 DIAGNOSIS — J209 Acute bronchitis, unspecified: Secondary | ICD-10-CM | POA: Insufficient documentation

## 2015-04-22 DIAGNOSIS — K259 Gastric ulcer, unspecified as acute or chronic, without hemorrhage or perforation: Secondary | ICD-10-CM | POA: Diagnosis not present

## 2015-04-22 DIAGNOSIS — Z7902 Long term (current) use of antithrombotics/antiplatelets: Secondary | ICD-10-CM | POA: Diagnosis not present

## 2015-04-22 DIAGNOSIS — F17210 Nicotine dependence, cigarettes, uncomplicated: Secondary | ICD-10-CM | POA: Insufficient documentation

## 2015-04-22 DIAGNOSIS — E785 Hyperlipidemia, unspecified: Secondary | ICD-10-CM | POA: Diagnosis not present

## 2015-04-22 DIAGNOSIS — J40 Bronchitis, not specified as acute or chronic: Secondary | ICD-10-CM

## 2015-04-22 LAB — CBC WITH DIFFERENTIAL/PLATELET
Basophils Absolute: 0 10*3/uL (ref 0.0–0.1)
Basophils Relative: 0 %
Eosinophils Absolute: 0.1 10*3/uL (ref 0.0–0.7)
Eosinophils Relative: 2 %
HEMATOCRIT: 42.4 % (ref 39.0–52.0)
HEMOGLOBIN: 13.9 g/dL (ref 13.0–17.0)
LYMPHS ABS: 1 10*3/uL (ref 0.7–4.0)
LYMPHS PCT: 12 %
MCH: 27.1 pg (ref 26.0–34.0)
MCHC: 32.8 g/dL (ref 30.0–36.0)
MCV: 82.7 fL (ref 78.0–100.0)
MONOS PCT: 7 %
Monocytes Absolute: 0.6 10*3/uL (ref 0.1–1.0)
NEUTROS ABS: 6.7 10*3/uL (ref 1.7–7.7)
NEUTROS PCT: 79 %
Platelets: 162 10*3/uL (ref 150–400)
RBC: 5.13 MIL/uL (ref 4.22–5.81)
RDW: 15.3 % (ref 11.5–15.5)
WBC: 8.4 10*3/uL (ref 4.0–10.5)

## 2015-04-22 LAB — BASIC METABOLIC PANEL
Anion gap: 11 (ref 5–15)
BUN: 14 mg/dL (ref 6–20)
CALCIUM: 8.7 mg/dL — AB (ref 8.9–10.3)
CO2: 22 mmol/L (ref 22–32)
CREATININE: 1.22 mg/dL (ref 0.61–1.24)
Chloride: 105 mmol/L (ref 101–111)
Glucose, Bld: 112 mg/dL — ABNORMAL HIGH (ref 65–99)
POTASSIUM: 3.3 mmol/L — AB (ref 3.5–5.1)
Sodium: 138 mmol/L (ref 135–145)

## 2015-04-22 LAB — I-STAT TROPONIN, ED: TROPONIN I, POC: 0.01 ng/mL (ref 0.00–0.08)

## 2015-04-22 LAB — BRAIN NATRIURETIC PEPTIDE: B Natriuretic Peptide: 166.6 pg/mL — ABNORMAL HIGH (ref 0.0–100.0)

## 2015-04-22 MED ORDER — METHYLPREDNISOLONE SODIUM SUCC 125 MG IJ SOLR
125.0000 mg | Freq: Once | INTRAMUSCULAR | Status: AC
  Administered 2015-04-22: 125 mg via INTRAVENOUS
  Filled 2015-04-22: qty 2

## 2015-04-22 NOTE — ED Provider Notes (Signed)
CSN: [id number]     Arrival date & time 04/22/15  2119 History   First MD Initiated Contact with Patient 04/22/15 2133     Chief Complaint  Patient presents with  . Shortness of Breath     (Consider location/radiation/quality/duration/timing/severity/associated sxs/prior Treatment) HPI Comments: Pt comes in with cc of DIB. Pt has hx of stroke, stomach ulcer, MI (seems more like NSTEMI/demand ischemia denies cath or interventions).  He has always had some exertional dyspnea, but starting last night he has had worsening dyspnea even at rest. There is wheezing, no new cough, no fevers, chills, chest pains. Pt has been giving himself albuterol tx, but doesn't feel like he has any relief.   ROS 10 Systems reviewed and are negative for acute change except as noted in the HPI.      Patient is a 64 y.o. male presenting with shortness of breath. The history is provided by the patient.  Shortness of Breath   Past Medical History  Diagnosis Date  . Hypertension   . Gastric ulcer   . Stroke ([place])   . Tobacco abuse   . Hyperlipemia   . MI (myocardial infarction) [facility])    Past Surgical History  Procedure Laterality Date  . Esophagogastroduodenoscopy N/A 08/09/2014    Procedure: ESOPHAGOGASTRODUODENOSCOPY (EGD);  Surgeon: Jerene Bears, MD;  Location: [facility];  Service: Gastroenterology;  Laterality: N/A;  . Esophagogastroduodenoscopy N/A 08/10/2014    Procedure: ESOPHAGOGASTRODUODENOSCOPY (EGD);  Surgeon: Jerene Bears, MD;  Location: [facility];  Service: Gastroenterology;  Laterality: N/A;   Family History  Problem Relation Age of Onset  . Pancreatic cancer Mother   . Heart attack Father 46   Social History  Substance Use Topics  . Smoking status: Current Every Day Smoker -- 1.50 packs/day    Types: Cigarettes  . Smokeless tobacco: Never Used     Comment: He was taking Chantix but had nightmares.  . Alcohol Use: 0.0 oz/week    0 Standard drinks or equivalent per week    Comment: occasional    Review of Systems  Respiratory: Positive for shortness of breath.       Allergies  Review of patient's allergies indicates no known allergies.  Home Medications   Prior to Admission medications   Medication Sig Start Date End Date Taking? Authorizing Provider  amLODipine (NORVASC) 10 MG tablet Take 1 tablet (10 mg total) by mouth daily. 05/19/13  Yes Thurnell Lose, MD  aspirin EC 81 MG tablet Take 81 mg by mouth every 6 (six) hours as needed for moderate pain.   Yes Historical Provider, MD  metoprolol (LOPRESSOR) 50 MG tablet Take 25 mg by mouth 2 (two) times daily.    Yes Historical Provider, MD  amoxicillin (AMOXIL) 500 MG tablet Take 2 tablets (1,000 mg total) by mouth 2 (two) times daily. 08/16/14   Jerene Bears, MD  clarithromycin (BIAXIN) 500 MG tablet Take 1 tablet (500 mg total) by mouth 2 (two) times daily. 08/16/14   Jerene Bears, MD  clopidogrel (PLAVIX) 75 MG tablet Take 1 tablet (75 mg total) by mouth daily with breakfast. Patient not taking: Reported on 04/22/2015 08/27/14   Charlynne Cousins, MD  pantoprazole (PROTONIX) 40 MG tablet Take 1 tablet (40 mg total) by mouth 2 (two) times daily. Patient not taking: Reported on 04/22/2015 08/16/14   Jerene Bears, MD  simvastatin (ZOCOR) 20 MG tablet Take 1 tablet (20 mg total) by mouth daily at 6 PM. Patient not  taking: Reported on 04/22/2015 05/19/13   Thurnell Lose, MD   BP 128/86 mmHg  Pulse 97  Temp(Src) 99.8 F (37.7 C) (Oral)  Resp 20  Ht 5\' 7"  (1.702 m)  Wt 235 lb (106.595 kg)  BMI 36.80 kg/m2  SpO2 99% Physical Exam  Constitutional: He is oriented to person, place, and time. He appears well-developed.  HENT:  Head: Normocephalic and atraumatic.  Eyes: Conjunctivae and EOM are normal. Pupils are equal, round, and reactive to light.  Neck: Normal range of motion. Neck supple.  Cardiovascular: Normal rate, regular rhythm, normal heart sounds and intact distal pulses.   Pulmonary/Chest:  Effort normal. No respiratory distress. He has no wheezes. He has no rales. He exhibits no tenderness.  Abdominal: Soft. Bowel sounds are normal. He exhibits no distension. There is no tenderness. There is no rebound and no guarding.  Musculoskeletal: He exhibits no edema or tenderness.  Neurological: He is alert and oriented to person, place, and time.  Skin: Skin is warm.  Nursing note and vitals reviewed.   ED Course  Procedures (including critical care time) Labs Review Labs Reviewed  BASIC METABOLIC PANEL - Abnormal; Notable for the following:    Potassium 3.3 (*)    Glucose, Bld 112 (*)    Calcium 8.7 (*)    All other components within normal limits  BRAIN NATRIURETIC PEPTIDE - Abnormal; Notable for the following:    B Natriuretic Peptide 166.6 (*)    All other components within normal limits  CBC WITH DIFFERENTIAL/PLATELET  Randolm Idol, ED    Imaging Review Dg Chest 2 View  04/22/2015  CLINICAL DATA:  Acute onset of shortness of breath. Initial encounter. EXAM: CHEST  2 VIEW COMPARISON:  Chest radiograph performed 01/11/2015 FINDINGS: The lungs are well-aerated. Peribronchial thickening is noted, with mild bibasilar atelectasis. There is no evidence of pleural effusion or pneumothorax. The heart is normal in size; the mediastinal contour is within normal limits. No acute osseous abnormalities are seen. IMPRESSION: Peribronchial thickening, with mild bibasilar atelectasis. Electronically Signed   By: Garald Balding M.D.   On: 04/22/2015 22:16   I have personally reviewed and evaluated these images and lab results as part of my medical decision-making.   EKG Interpretation   Date/Time:  Saturday April 22 2015 21:28:55 EST Ventricular Rate:  101 PR Interval:  190 QRS Duration: 77 QT Interval:  344 QTC Calculation: 446 R Axis:   -20 Text Interpretation:  Sinus tachycardia Atrial premature complex  Borderline left axis deviation Anterior infarct, old - q waves seen  with  subtle ST elevation avR ST elevation Nonspecific ST and T wave abnormality  - new Confirmed by Kathrynn Humble, MD, Thelma Comp [phone]) on 04/22/2015 9:41:25 PM      MDM   Final diagnoses:  Bronchitis  Hypoxia    Pt comes in with cc of dib. Pt has hx of HTN, Asthma. Also has extensive smoking hx. Reports chronic dyspnea, which got acutely worse last night. Pt is in resp distress at arrival - RR 22, he is getting nebs, and he doesn't have severe wheezing. Echo earlier in the year was neg for CHF. No leg  Swelling, no orthopnea.  Pt has no hx of PE, DVT and denies any exogenous estrogen use, long distance travels or surgery in the past 6 weeks, active cancer, recent immobilization.  CXR shows bronchitis - and initial gestalt was that this might be new onset COPD.  11:53 PM Pt reassessed post albuterol tx X 2.  He still feels unwell. Will get ambulatory pulsox. His O2 sats ar rest at low 90s, so if he becomes hypoxic, he will need admission.     Varney Biles, MD 04/22/15 [phone]

## 2015-04-22 NOTE — ED Notes (Signed)
Pt does have a clot in his left upper chest - he is unsure what the plan with this is. Unsure of which artery it is in.

## 2015-04-22 NOTE — ED Notes (Signed)
Per [facility]: pt coming from home, congestion and shortness of breath that started yesterday, shortness of breath worse with exertion, left lower back pain with cough, productive clear cough, also sinus congestion. HX of COPD. 12 lead unremarkable. Lung sounds clear. My or may not have been having fevers, no nausea or vomiting, no abnormal urination.   5 of albuterol with no improvement --started on second dose of 5 of albuterol and 0.5 of atrovent - treatment in progress upon arrival.

## 2015-04-23 LAB — I-STAT TROPONIN, ED: Troponin i, poc: 0.01 ng/mL (ref 0.00–0.08)

## 2015-04-23 MED ORDER — AZITHROMYCIN 250 MG PO TABS: 250.0000 mg | ORAL_TABLET | Freq: Every day | ORAL | Status: DC

## 2015-04-23 MED ORDER — ALBUTEROL SULFATE HFA 108 (90 BASE) MCG/ACT IN AERS: 2.0000 | INHALATION_SPRAY | RESPIRATORY_TRACT | Status: DC | PRN

## 2015-04-23 MED ORDER — PREDNISONE 20 MG PO TABS: 60.0000 mg | ORAL_TABLET | Freq: Every day | ORAL | Status: DC

## 2015-04-23 NOTE — ED Provider Notes (Signed)
Patient signed out to me to follow-up on second troponin. Patient was seen with shortness of breath. He has had 2 troponins which are negative. His workup was otherwise unremarkable. Patient responded to albuterol therapy. He has ambulated here in the ER without desaturation. He is appropriate for outpatient treatment with Zithromax, prednisone, albuterol. Follow-up with primary care and pulmonology. Return if symptoms worsen.  Orpah Greek, MD 04/23/15 [phone]

## 2015-04-23 NOTE — ED Notes (Signed)
Pt left with all his belongings and ambulated out of the treatment area.  

## 2015-04-23 NOTE — ED Notes (Signed)
Pt walked with this RN and Yvone Neu NT. Pts oxygen saturation level stayed above 96% the entire time that he was walking.

## 2015-04-23 NOTE — Discharge Instructions (Signed)
We saw you in the ER for your breathing related complains. We gave you some breathing treatments in the ER, and seems like your symptoms have improved. Please take albuterol as needed every 4 hours. Please take the medications prescribed. Please refrain from smoking or smoke exposure. Please see a primary care doctor in 1 week. Return to the ER if your symptoms worsen.   Please see the Pulmonologist as requested.   Upper Respiratory Infection, Adult Most upper respiratory infections (URIs) are a viral infection of the air passages leading to the lungs. A URI affects the nose, throat, and upper air passages. The most common type of URI is nasopharyngitis and is typically referred to as "the common cold." URIs run their course and usually go away on their own. Most of the time, a URI does not require medical attention, but sometimes a bacterial infection in the upper airways can follow a viral infection. This is called a secondary infection. Sinus and middle ear infections are common types of secondary upper respiratory infections. Bacterial pneumonia can also complicate a URI. A URI can worsen asthma and chronic obstructive pulmonary disease (COPD). Sometimes, these complications can require emergency medical care and may be life threatening.  CAUSES Almost all URIs are caused by viruses. A virus is a type of germ and can spread from one person to another.  RISKS FACTORS You may be at risk for a URI if:   You smoke.   You have chronic heart or lung disease.  You have a weakened defense (immune) system.   You are very young or very old.   You have nasal allergies or asthma.  You work in crowded or poorly ventilated areas.  You work in health care facilities or schools. SIGNS AND SYMPTOMS  Symptoms typically develop 2-3 days after you come in contact with a cold virus. Most viral URIs last 7-10 days. However, viral URIs from the influenza virus (flu virus) can last 14-18 days and  are typically more severe. Symptoms may include:   Runny or stuffy (congested) nose.   Sneezing.   Cough.   Sore throat.   Headache.   Fatigue.   Fever.   Loss of appetite.   Pain in your forehead, behind your eyes, and over your cheekbones (sinus pain).  Muscle aches.  DIAGNOSIS  Your health care provider may diagnose a URI by:  Physical exam.  Tests to check that your symptoms are not due to another condition such as:  Strep throat.  Sinusitis.  Pneumonia.  Asthma. TREATMENT  A URI goes away on its own with time. It cannot be cured with medicines, but medicines may be prescribed or recommended to relieve symptoms. Medicines may help:  Reduce your fever.  Reduce your cough.  Relieve nasal congestion. HOME CARE INSTRUCTIONS   Take medicines only as directed by your health care provider.   Gargle warm saltwater or take cough drops to comfort your throat as directed by your health care provider.  Use a warm mist humidifier or inhale steam from a shower to increase air moisture. This may make it easier to breathe.  Drink enough fluid to keep your urine clear or pale yellow.   Eat soups and other clear broths and maintain good nutrition.   Rest as needed.   Return to work when your temperature has returned to normal or as your health care provider advises. You may need to stay home longer to avoid infecting others. You can also use a face mask  and careful hand washing to prevent spread of the virus.  Increase the usage of your inhaler if you have asthma.   Do not use any tobacco products, including cigarettes, chewing tobacco, or electronic cigarettes. If you need help quitting, ask your health care provider. PREVENTION  The best way to protect yourself from getting a cold is to practice good hygiene.  1. Avoid oral or hand contact with people with cold symptoms.  2. Wash your hands often if contact occurs.  There is no clear evidence that  vitamin C, vitamin E, echinacea, or exercise reduces the chance of developing a cold. However, it is always recommended to get plenty of rest, exercise, and practice good nutrition.  SEEK MEDICAL CARE IF:   You are getting worse rather than better.   Your symptoms are not controlled by medicine.   You have chills.  You have worsening shortness of breath.  You have brown or red mucus.  You have yellow or brown nasal discharge.  You have pain in your face, especially when you bend forward.  You have a fever.  You have swollen neck glands.  You have pain while swallowing.  You have white areas in the back of your throat. SEEK IMMEDIATE MEDICAL CARE IF:   You have severe or persistent:  Headache.  Ear pain.  Sinus pain.  Chest pain.  You have chronic lung disease and any of the following:  Wheezing.  Prolonged cough.  Coughing up blood.  A change in your usual mucus.  You have a stiff neck.  You have changes in your:  Vision.  Hearing.  Thinking.  Mood. MAKE SURE YOU:   Understand these instructions.  Will watch your condition.  Will get help right away if you are not doing well or get worse.   This information is not intended to replace advice given to you by your health care provider. Make sure you discuss any questions you have with your health care provider.   Document Released: 10/02/2000 Document Revised: 08/23/2014 Document Reviewed: 07/14/2013 Elsevier Interactive Patient Education 2016 [employer] Chronic Obstructive Pulmonary Disease Chronic obstructive pulmonary disease (COPD) is a common lung condition in which airflow from the lungs is limited. COPD is a general term that can be used to describe many different lung problems that limit airflow, including both chronic bronchitis and emphysema. If you have COPD, your lung function will probably never return to normal, but there are measures you can take to improve lung function and make  yourself feel better. CAUSES   Smoking (common).  Exposure to secondhand smoke.  Genetic problems.  Chronic inflammatory lung diseases or recurrent infections. SYMPTOMS  Shortness of breath, especially with physical activity.  Deep, persistent (chronic) cough with a large amount of thick mucus.  Wheezing.  Rapid breaths (tachypnea).  Gray or bluish discoloration (cyanosis) of the skin, especially in your fingers, toes, or lips.  Fatigue.  Weight loss.  Frequent infections or episodes when breathing symptoms become much worse (exacerbations).  Chest tightness. DIAGNOSIS Your health care provider will take a medical history and perform a physical examination to diagnose COPD. Additional tests for COPD may include:  Lung (pulmonary) function tests.  Chest X-ray.  CT scan.  Blood tests. TREATMENT  Treatment for COPD may include:  Inhaler and nebulizer medicines. These help manage the symptoms of COPD and make your breathing more comfortable.  Supplemental oxygen. Supplemental oxygen is only helpful if you have a low oxygen level in your blood.  Exercise and physical activity. These are beneficial for nearly all people with COPD.  Lung surgery or transplant.  Nutrition therapy to gain weight, if you are underweight.  Pulmonary rehabilitation. This may involve working with a team of health care providers and specialists, such as respiratory, occupational, and physical therapists. HOME CARE INSTRUCTIONS  Take all medicines (inhaled or pills) as directed by your health care provider.  Avoid over-the-counter medicines or cough syrups that dry up your airway (such as antihistamines) and slow down the elimination of secretions unless instructed otherwise by your health care provider.  If you are a smoker, the most important thing that you can do is stop smoking. Continuing to smoke will cause further lung damage and breathing trouble. Ask your health care provider for  help with quitting smoking. He or she can direct you to community resources or hospitals that provide support.  Avoid exposure to irritants such as smoke, chemicals, and fumes that aggravate your breathing.  Use oxygen therapy and pulmonary rehabilitation if directed by your health care provider. If you require home oxygen therapy, ask your health care provider whether you should purchase a pulse oximeter to measure your oxygen level at home.  Avoid contact with individuals who have a contagious illness.  Avoid extreme temperature and humidity changes.  Eat healthy foods. Eating smaller, more frequent meals and resting before meals may help you maintain your strength.  Stay active, but balance activity with periods of rest. Exercise and physical activity will help you maintain your ability to do things you want to do.  Preventing infection and hospitalization is very important when you have COPD. Make sure to receive all the vaccines your health care provider recommends, especially the pneumococcal and influenza vaccines. Ask your health care provider whether you need a pneumonia vaccine.  Learn and use relaxation techniques to manage stress.  Learn and use controlled breathing techniques as directed by your health care provider. Controlled breathing techniques include:  Pursed lip breathing. Start by breathing in (inhaling) through your nose for 1 second. Then, purse your lips as if you were going to whistle and breathe out (exhale) through the pursed lips for 2 seconds.  Diaphragmatic breathing. Start by putting one hand on your abdomen just above your waist. Inhale slowly through your nose. The hand on your abdomen should move out. Then purse your lips and exhale slowly. You should be able to feel the hand on your abdomen moving in as you exhale.  Learn and use controlled coughing to clear mucus from your lungs. Controlled coughing is a series of short, progressive coughs. The steps of  controlled coughing are: 3. Lean your head slightly forward. 4. Breathe in deeply using diaphragmatic breathing. 5. Try to hold your breath for 3 seconds. 6. Keep your mouth slightly open while coughing twice. 7. Spit any mucus out into a tissue. 8. Rest and repeat the steps once or twice as needed. SEEK MEDICAL CARE IF:  You are coughing up more mucus than usual.  There is a change in the color or thickness of your mucus.  Your breathing is more labored than usual.  Your breathing is faster than usual. SEEK IMMEDIATE MEDICAL CARE IF:  You have shortness of breath while you are resting.  You have shortness of breath that prevents you from:  Being able to talk.  Performing your usual physical activities.  You have chest pain lasting longer than 5 minutes.  Your skin color is more cyanotic than usual.  You  measure low oxygen saturations for longer than 5 minutes with a pulse oximeter. MAKE SURE YOU:  Understand these instructions.  Will watch your condition.  Will get help right away if you are not doing well or get worse.   This information is not intended to replace advice given to you by your health care provider. Make sure you discuss any questions you have with your health care provider.   Document Released: 01/16/2005 Document Revised: 04/29/2014 Document Reviewed: 12/03/2012 Elsevier Interactive Patient Education [employer].

## 2015-08-25 DIAGNOSIS — E785 Hyperlipidemia, unspecified: Secondary | ICD-10-CM | POA: Diagnosis not present

## 2015-08-25 DIAGNOSIS — I129 Hypertensive chronic kidney disease with stage 1 through stage 4 chronic kidney disease, or unspecified chronic kidney disease: Secondary | ICD-10-CM | POA: Diagnosis not present

## 2015-08-25 DIAGNOSIS — N183 Chronic kidney disease, stage 3 (moderate): Secondary | ICD-10-CM | POA: Diagnosis not present

## 2015-08-25 DIAGNOSIS — I10 Essential (primary) hypertension: Secondary | ICD-10-CM | POA: Diagnosis not present

## 2015-08-29 ENCOUNTER — Emergency Department (HOSPITAL_COMMUNITY)
Admission: EM | Admit: 2015-08-29 | Discharge: 2015-08-29 | Disposition: A | Discharge status: Home / Self Care | Payer: PPO | Attending: Emergency Medicine | Admitting: Emergency Medicine

## 2015-08-29 ENCOUNTER — Encounter (HOSPITAL_COMMUNITY): Payer: Self-pay

## 2015-08-29 DIAGNOSIS — Z79899 Other long term (current) drug therapy: Secondary | ICD-10-CM | POA: Insufficient documentation

## 2015-08-29 DIAGNOSIS — Z7952 Long term (current) use of systemic steroids: Secondary | ICD-10-CM | POA: Insufficient documentation

## 2015-08-29 DIAGNOSIS — Z7982 Long term (current) use of aspirin: Secondary | ICD-10-CM | POA: Diagnosis not present

## 2015-08-29 DIAGNOSIS — I252 Old myocardial infarction: Secondary | ICD-10-CM | POA: Diagnosis not present

## 2015-08-29 DIAGNOSIS — N183 Chronic kidney disease, stage 3 (moderate): Secondary | ICD-10-CM | POA: Diagnosis not present

## 2015-08-29 DIAGNOSIS — E785 Hyperlipidemia, unspecified: Secondary | ICD-10-CM | POA: Diagnosis not present

## 2015-08-29 DIAGNOSIS — F17200 Nicotine dependence, unspecified, uncomplicated: Secondary | ICD-10-CM

## 2015-08-29 DIAGNOSIS — I129 Hypertensive chronic kidney disease with stage 1 through stage 4 chronic kidney disease, or unspecified chronic kidney disease: Secondary | ICD-10-CM | POA: Diagnosis not present

## 2015-08-29 DIAGNOSIS — E663 Overweight: Secondary | ICD-10-CM | POA: Diagnosis not present

## 2015-08-29 DIAGNOSIS — I6509 Occlusion and stenosis of unspecified vertebral artery: Secondary | ICD-10-CM | POA: Diagnosis not present

## 2015-08-29 DIAGNOSIS — R51 Headache: Secondary | ICD-10-CM | POA: Diagnosis not present

## 2015-08-29 DIAGNOSIS — F17210 Nicotine dependence, cigarettes, uncomplicated: Secondary | ICD-10-CM | POA: Diagnosis not present

## 2015-08-29 DIAGNOSIS — N261 Atrophy of kidney (terminal): Secondary | ICD-10-CM | POA: Diagnosis not present

## 2015-08-29 DIAGNOSIS — I10 Essential (primary) hypertension: Secondary | ICD-10-CM | POA: Insufficient documentation

## 2015-08-29 DIAGNOSIS — I708 Atherosclerosis of other arteries: Secondary | ICD-10-CM | POA: Diagnosis not present

## 2015-08-29 DIAGNOSIS — F329 Major depressive disorder, single episode, unspecified: Secondary | ICD-10-CM | POA: Diagnosis not present

## 2015-08-29 DIAGNOSIS — Z8719 Personal history of other diseases of the digestive system: Secondary | ICD-10-CM | POA: Diagnosis not present

## 2015-08-29 DIAGNOSIS — H538 Other visual disturbances: Secondary | ICD-10-CM | POA: Diagnosis not present

## 2015-08-29 DIAGNOSIS — Z8673 Personal history of transient ischemic attack (TIA), and cerebral infarction without residual deficits: Secondary | ICD-10-CM | POA: Diagnosis not present

## 2015-08-29 DIAGNOSIS — I700 Atherosclerosis of aorta: Secondary | ICD-10-CM | POA: Diagnosis not present

## 2015-08-29 DIAGNOSIS — I701 Atherosclerosis of renal artery: Secondary | ICD-10-CM | POA: Diagnosis not present

## 2015-08-29 MED ORDER — AMLODIPINE BESYLATE 5 MG PO TABS
10.0000 mg | ORAL_TABLET | Freq: Once | ORAL | Status: AC
  Administered 2015-08-29: 10 mg via ORAL
  Filled 2015-08-29: qty 2

## 2015-08-29 NOTE — ED Notes (Signed)
Pt here with c/o high blood pressure 210/110  Today at [place]. Pt states his meds are being adjusted. He has had a headache and blurred vision. Pt A&Ox4, speaking clear complete sentences.

## 2015-08-29 NOTE — ED Provider Notes (Signed)
CSN: [id number]     Arrival date & time 08/29/15  1609 History   First MD Initiated Contact with Patient 08/29/15 1852     Chief Complaint  Patient presents with  . Hypertension     (Consider location/radiation/quality/duration/timing/severity/associated sxs/prior Treatment) HPI   Blood pressure 171/105, pulse 73, temperature 98 F (36.7 C), temperature source Oral, resp. rate 18, height 5' 7.5" (1.715 m), weight 111.131 kg, SpO2 94 %.  Luke Kaufman is a 65 y.o. male with PMHx significant for HTN, prior CVA,  NSTEMI, chronic tobacco use, who presents to the ED after an elevated BP reading at his PCPs office today. Pt reports he had an annual checkup today and had BP reading of 210/110. Pt reports he has taken metoprolol as prescribed but has failed to take Norvasc for 5 months due to interruption in insurance coverage. Pt notes associated blurred vision and headaches that have been more frequent for the past few months. Headache typical for prior. Patient denies chest pain, nausea, vomiting, change in vision, dysarthria, ataxia, cervicalgia, abdominal pain, change in bowel or bladder habits. Of note, patient's gained 25 pounds in last 2 months. Was advised to present to the ED by primary care. Primary care physician has called in a prescription for Norvasc and has made that follow-up with this patient,  Past Medical History  Diagnosis Date  . Hypertension   . Gastric ulcer   . Stroke ([place])   . Tobacco abuse   . Hyperlipemia   . MI (myocardial infarction) [facility])    Past Surgical History  Procedure Laterality Date  . Esophagogastroduodenoscopy N/A 08/09/2014    Procedure: ESOPHAGOGASTRODUODENOSCOPY (EGD);  Surgeon: Jerene Bears, MD;  Location: [facility];  Service: Gastroenterology;  Laterality: N/A;  . Esophagogastroduodenoscopy N/A 08/10/2014    Procedure: ESOPHAGOGASTRODUODENOSCOPY (EGD);  Surgeon: Jerene Bears, MD;  Location: [facility];  Service: Gastroenterology;  Laterality:  N/A;   Family History  Problem Relation Age of Onset  . Pancreatic cancer Mother   . Heart attack Father 72   Social History  Substance Use Topics  . Smoking status: Current Every Day Smoker -- 1.50 packs/day    Types: Cigarettes  . Smokeless tobacco: Never Used     Comment: He was taking Chantix but had nightmares.  . Alcohol Use: 0.0 oz/week    0 Standard drinks or equivalent per week     Comment: occasional    Review of Systems  10 systems reviewed and found to be negative, except as noted in the HPI.  Allergies  Review of patient's allergies indicates no known allergies.  Home Medications   Prior to Admission medications   Medication Sig Start Date End Date Taking? Authorizing Provider  amLODipine (NORVASC) 10 MG tablet Take 1 tablet (10 mg total) by mouth daily. 05/19/13   Thurnell Lose, MD  aspirin EC 81 MG tablet Take 81 mg by mouth every 6 (six) hours as needed for moderate pain.    Historical Provider, MD  azithromycin (ZITHROMAX) 250 MG tablet Take 1 tablet (250 mg total) by mouth daily. Take first 2 tablets together, then 1 every day until finished. 04/23/15   Orpah Greek, MD  clopidogrel (PLAVIX) 75 MG tablet Take 1 tablet (75 mg total) by mouth daily with breakfast. Patient not taking: Reported on 04/22/2015 08/27/14   Charlynne Cousins, MD  metoprolol (LOPRESSOR) 50 MG tablet Take 25 mg by mouth 2 (two) times daily.     Historical Provider, MD  pantoprazole (  PROTONIX) 40 MG tablet Take 1 tablet (40 mg total) by mouth 2 (two) times daily. Patient not taking: Reported on 04/22/2015 08/16/14   Jerene Bears, MD  predniSONE (DELTASONE) 20 MG tablet Take 3 tablets (60 mg total) by mouth daily with breakfast. 04/23/15   Orpah Greek, MD  simvastatin (ZOCOR) 20 MG tablet Take 1 tablet (20 mg total) by mouth daily at 6 PM. Patient not taking: Reported on 04/22/2015 05/19/13   Thurnell Lose, MD   BP 171/105 mmHg  Pulse 73  Temp(Src) 98 F (36.7 C)  (Oral)  Resp 18  Ht 5' 7.5" (1.715 m)  Wt 111.131 kg  BMI 37.78 kg/m2  SpO2 94% Physical Exam  Constitutional: He is oriented to person, place, and time. He appears well-developed and well-nourished. No distress.  HENT:  Head: Normocephalic and atraumatic.  Mouth/Throat: Oropharynx is clear and moist.  Eyes: Conjunctivae and EOM are normal. Pupils are equal, round, and reactive to light.  No TTP of maxillary or frontal sinuses  No TTP or induration of temporal arteries bilaterally  Neck: Normal range of motion. Neck supple. No JVD present. No tracheal deviation present.  FROM to C-spine. Pt can touch chin to chest without discomfort. No TTP of midline cervical spine.   Cardiovascular: Normal rate, regular rhythm and intact distal pulses.   Radial pulse equal bilaterally  Pulmonary/Chest: Effort normal and breath sounds normal. No stridor. No respiratory distress. He has no wheezes. He has no rales. He exhibits no tenderness.  Abdominal: Soft. Bowel sounds are normal. He exhibits no distension and no mass. There is no tenderness. There is no rebound and no guarding.  Musculoskeletal: Normal range of motion. He exhibits no edema or tenderness.  No calf asymmetry, superficial collaterals, palpable cords, edema, Homans sign negative bilaterally.    Neurological: He is alert and oriented to person, place, and time. No cranial nerve deficit.  II-Visual fields grossly intact. III/IV/VI-Extraocular movements intact.  Pupils reactive bilaterally. V/VII-Smile symmetric, equal eyebrow raise,  facial sensation intact VIII- Hearing grossly intact IX/X-Normal gag XI-bilateral shoulder shrug XII-midline tongue extension Motor: 5/5 bilaterally with normal tone and bulk Cerebellar: Normal finger-to-nose  and normal heel-to-shin test.   Romberg negative Ambulates with a coordinated gait   Skin: Skin is warm. He is not diaphoretic.  Psychiatric: He has a normal mood and affect.  Nursing note and  vitals reviewed.   ED Course  Procedures (including critical care time) Labs Review Labs Reviewed - No data to display  Imaging Review No results found. I have personally reviewed and evaluated these images and lab results as part of my medical decision-making.   EKG Interpretation None      MDM   Final diagnoses:  Uncontrolled hypertension  Tobacco use disorder  Overweight    Filed Vitals:   08/29/15 1615 08/29/15 1902  BP: 171/105 194/104  Pulse: 73 72  Temp: 98 F (36.7 C)   TempSrc: Oral   Resp: 18 17  Height: 5' 7.5" (1.715 m)   Weight: 111.131 kg   SpO2: 94% 96%    Medications  amLODipine (NORVASC) tablet 10 mg (10 mg Oral Given 08/29/15 2001)    Aimee Koeppe is 65 y.o. male presenting with Sent from primary care for evaluation of elevated blood pressure. Blood pressure in the ED is 171/105, he has been noncompliant with his amlodipine for the last 5 months, patient smokes regularly and has also gained 25 pounds. Neurologic exam is nonfocal, headache is typical,  no signs or symptoms of endorgan damage, likely asymptomatic uncontrolled hypertension. Counseled patient on tobacco cessation, patient will follow with his primary care and he pledges that he will take his antihypertensive medications regularly. We've had an extensive discussion of return precautions patient verbalizes understanding.  Discussed case with attending physician who agrees with care plan and disposition.   Evaluation does not show pathology that would require ongoing emergent intervention or inpatient treatment. Pt is hemodynamically stable and mentating appropriately. Discussed findings and plan with patient/guardian, who agrees with care plan. All questions answered. Return precautions discussed and outpatient follow up given.     Luke Blitz, PA-C 08/29/15 2049

## 2015-08-29 NOTE — Discharge Instructions (Signed)
Please follow with your primary care doctor in the next 2 days for a check-up. They must obtain records for further management.   Do not hesitate to return to the Emergency Department for any new, worsening or concerning symptoms.    Hypertension Hypertension, commonly called high blood pressure, is when the force of blood pumping through your arteries is too strong. Your arteries are the blood vessels that carry blood from your heart throughout your body. A blood pressure reading consists of a higher number over a lower number, such as 110/72. The higher number (systolic) is the pressure inside your arteries when your heart pumps. The lower number (diastolic) is the pressure inside your arteries when your heart relaxes. Ideally you want your blood pressure below 120/80. Hypertension forces your heart to work harder to pump blood. Your arteries may become narrow or stiff. Having untreated or uncontrolled hypertension can cause heart attack, stroke, kidney disease, and other problems. RISK FACTORS Some risk factors for high blood pressure are controllable. Others are not.  Risk factors you cannot control include:   Race. You may be at higher risk if you are African American.  Age. Risk increases with age.  Gender. Men are at higher risk than women before age 3 years. After age 22, women are at higher risk than men. Risk factors you can control include:  Not getting enough exercise or physical activity.  Being overweight.  Getting too much fat, sugar, calories, or salt in your diet.  Drinking too much alcohol. SIGNS AND SYMPTOMS Hypertension does not usually cause signs or symptoms. Extremely high blood pressure (hypertensive crisis) may cause headache, anxiety, shortness of breath, and nosebleed. DIAGNOSIS To check if you have hypertension, your health care provider will measure your blood pressure while you are seated, with your arm held at the level of your heart. It should be measured  at least twice using the same arm. Certain conditions can cause a difference in blood pressure between your right and left arms. A blood pressure reading that is higher than normal on one occasion does not mean that you need treatment. If it is not clear whether you have high blood pressure, you may be asked to return on a different day to have your blood pressure checked again. Or, you may be asked to monitor your blood pressure at home for 1 or more weeks. TREATMENT Treating high blood pressure includes making lifestyle changes and possibly taking medicine. Living a healthy lifestyle can help lower high blood pressure. You may need to change some of your habits. Lifestyle changes may include:  Following the DASH diet. This diet is high in fruits, vegetables, and whole grains. It is low in salt, red meat, and added sugars.  Keep your sodium intake below 2,300 mg per day.  Getting at least 30-45 minutes of aerobic exercise at least 4 times per week.  Losing weight if necessary.  Not smoking.  Limiting alcoholic beverages.  Learning ways to reduce stress. Your health care provider may prescribe medicine if lifestyle changes are not enough to get your blood pressure under control, and if one of the following is true:  You are 30-87 years of age and your systolic blood pressure is above 140.  You are 81 years of age or older, and your systolic blood pressure is above 150.  Your diastolic blood pressure is above 90.  You have diabetes, and your systolic blood pressure is over [id number] or your diastolic blood pressure is over 90.  You have kidney disease and your blood pressure is above 140/90.  You have heart disease and your blood pressure is above 140/90. Your personal target blood pressure may vary depending on your medical conditions, your age, and other factors. HOME CARE INSTRUCTIONS  Have your blood pressure rechecked as directed by your health care provider.   Take medicines only  as directed by your health care provider. Follow the directions carefully. Blood pressure medicines must be taken as prescribed. The medicine does not work as well when you skip doses. Skipping doses also puts you at risk for problems.  Do not smoke.   Monitor your blood pressure at home as directed by your health care provider. SEEK MEDICAL CARE IF:   You think you are having a reaction to medicines taken.  You have recurrent headaches or feel dizzy.  You have swelling in your ankles.  You have trouble with your vision. SEEK IMMEDIATE MEDICAL CARE IF:  You develop a severe headache or confusion.  You have unusual weakness, numbness, or feel faint.  You have severe chest or abdominal pain.  You vomit repeatedly.  You have trouble breathing. MAKE SURE YOU:   Understand these instructions.  Will watch your condition.  Will get help right away if you are not doing well or get worse.   This information is not intended to replace advice given to you by your health care provider. Make sure you discuss any questions you have with your health care provider.   Document Released: 04/08/2005 Document Revised: 08/23/2014 Document Reviewed: 01/29/2013 Elsevier Interactive Patient Education [employer].

## 2015-08-31 DIAGNOSIS — E559 Vitamin D deficiency, unspecified: Secondary | ICD-10-CM | POA: Insufficient documentation

## 2015-09-07 DIAGNOSIS — F329 Major depressive disorder, single episode, unspecified: Secondary | ICD-10-CM | POA: Diagnosis not present

## 2015-09-07 DIAGNOSIS — I10 Essential (primary) hypertension: Secondary | ICD-10-CM | POA: Diagnosis not present

## 2015-09-07 DIAGNOSIS — I341 Nonrheumatic mitral (valve) prolapse: Secondary | ICD-10-CM | POA: Insufficient documentation

## 2015-09-07 DIAGNOSIS — Z09 Encounter for follow-up examination after completed treatment for conditions other than malignant neoplasm: Secondary | ICD-10-CM | POA: Diagnosis not present

## 2015-09-28 DIAGNOSIS — I700 Atherosclerosis of aorta: Secondary | ICD-10-CM | POA: Diagnosis not present

## 2015-09-28 DIAGNOSIS — I701 Atherosclerosis of renal artery: Secondary | ICD-10-CM | POA: Diagnosis not present

## 2015-09-28 DIAGNOSIS — I6509 Occlusion and stenosis of unspecified vertebral artery: Secondary | ICD-10-CM | POA: Diagnosis not present

## 2015-09-28 DIAGNOSIS — Z8673 Personal history of transient ischemic attack (TIA), and cerebral infarction without residual deficits: Secondary | ICD-10-CM | POA: Diagnosis not present

## 2015-09-28 DIAGNOSIS — N261 Atrophy of kidney (terminal): Secondary | ICD-10-CM | POA: Diagnosis not present

## 2015-09-28 DIAGNOSIS — I708 Atherosclerosis of other arteries: Secondary | ICD-10-CM | POA: Diagnosis not present

## 2015-09-28 DIAGNOSIS — G458 Other transient cerebral ischemic attacks and related syndromes: Secondary | ICD-10-CM | POA: Diagnosis not present

## 2015-10-19 DIAGNOSIS — I10 Essential (primary) hypertension: Secondary | ICD-10-CM | POA: Diagnosis not present

## 2015-10-19 DIAGNOSIS — F3341 Major depressive disorder, recurrent, in partial remission: Secondary | ICD-10-CM | POA: Diagnosis not present

## 2015-11-08 NOTE — ED Provider Notes (Signed)
Medical screening examination/treatment/procedure(s) were conducted as a shared visit with non-physician practitioner(s) and myself.  I personally evaluated the patient during the encounter.  Agree with plan.   EKG Interpretation None       Isla Pence, MD 11/08/15 [phone]

## 2015-11-10 DIAGNOSIS — I70209 Unspecified atherosclerosis of native arteries of extremities, unspecified extremity: Secondary | ICD-10-CM | POA: Diagnosis not present

## 2015-11-10 DIAGNOSIS — I70203 Unspecified atherosclerosis of native arteries of extremities, bilateral legs: Secondary | ICD-10-CM | POA: Diagnosis not present

## 2015-11-10 DIAGNOSIS — I700 Atherosclerosis of aorta: Secondary | ICD-10-CM | POA: Diagnosis not present

## 2015-11-10 DIAGNOSIS — I6523 Occlusion and stenosis of bilateral carotid arteries: Secondary | ICD-10-CM | POA: Diagnosis not present

## 2015-11-21 DIAGNOSIS — R200 Anesthesia of skin: Secondary | ICD-10-CM | POA: Diagnosis not present

## 2015-11-21 DIAGNOSIS — R202 Paresthesia of skin: Secondary | ICD-10-CM | POA: Diagnosis not present

## 2015-11-22 DIAGNOSIS — F17210 Nicotine dependence, cigarettes, uncomplicated: Secondary | ICD-10-CM | POA: Diagnosis not present

## 2015-11-22 DIAGNOSIS — Z79899 Other long term (current) drug therapy: Secondary | ICD-10-CM | POA: Diagnosis not present

## 2015-11-22 DIAGNOSIS — N183 Chronic kidney disease, stage 3 (moderate): Secondary | ICD-10-CM | POA: Diagnosis not present

## 2015-11-22 DIAGNOSIS — Z8673 Personal history of transient ischemic attack (TIA), and cerebral infarction without residual deficits: Secondary | ICD-10-CM | POA: Diagnosis not present

## 2015-11-22 DIAGNOSIS — I6523 Occlusion and stenosis of bilateral carotid arteries: Secondary | ICD-10-CM | POA: Diagnosis not present

## 2015-11-22 DIAGNOSIS — I700 Atherosclerosis of aorta: Secondary | ICD-10-CM | POA: Diagnosis not present

## 2015-11-22 DIAGNOSIS — I739 Peripheral vascular disease, unspecified: Secondary | ICD-10-CM | POA: Diagnosis not present

## 2015-11-22 DIAGNOSIS — Z7982 Long term (current) use of aspirin: Secondary | ICD-10-CM | POA: Diagnosis not present

## 2015-11-23 DIAGNOSIS — R05 Cough: Secondary | ICD-10-CM | POA: Diagnosis not present

## 2015-11-23 DIAGNOSIS — M542 Cervicalgia: Secondary | ICD-10-CM | POA: Diagnosis not present

## 2015-11-23 DIAGNOSIS — R200 Anesthesia of skin: Secondary | ICD-10-CM | POA: Diagnosis not present

## 2015-11-23 DIAGNOSIS — J42 Unspecified chronic bronchitis: Secondary | ICD-10-CM | POA: Diagnosis not present

## 2015-12-06 DIAGNOSIS — M50322 Other cervical disc degeneration at C5-C6 level: Secondary | ICD-10-CM | POA: Diagnosis not present

## 2015-12-06 DIAGNOSIS — M47812 Spondylosis without myelopathy or radiculopathy, cervical region: Secondary | ICD-10-CM | POA: Diagnosis not present

## 2015-12-14 DIAGNOSIS — G458 Other transient cerebral ischemic attacks and related syndromes: Secondary | ICD-10-CM | POA: Diagnosis not present

## 2015-12-14 DIAGNOSIS — Z7982 Long term (current) use of aspirin: Secondary | ICD-10-CM | POA: Diagnosis not present

## 2015-12-14 DIAGNOSIS — Z79899 Other long term (current) drug therapy: Secondary | ICD-10-CM | POA: Diagnosis not present

## 2015-12-14 DIAGNOSIS — R200 Anesthesia of skin: Secondary | ICD-10-CM | POA: Insufficient documentation

## 2015-12-14 DIAGNOSIS — F17210 Nicotine dependence, cigarettes, uncomplicated: Secondary | ICD-10-CM | POA: Diagnosis not present

## 2015-12-14 DIAGNOSIS — R202 Paresthesia of skin: Secondary | ICD-10-CM | POA: Diagnosis not present

## 2015-12-14 DIAGNOSIS — M5031 Other cervical disc degeneration,  high cervical region: Secondary | ICD-10-CM | POA: Diagnosis not present

## 2016-01-26 DIAGNOSIS — R200 Anesthesia of skin: Secondary | ICD-10-CM | POA: Diagnosis not present

## 2016-01-26 DIAGNOSIS — R197 Diarrhea, unspecified: Secondary | ICD-10-CM | POA: Diagnosis not present

## 2016-02-02 DIAGNOSIS — Z135 Encounter for screening for eye and ear disorders: Secondary | ICD-10-CM | POA: Diagnosis not present

## 2016-02-02 DIAGNOSIS — I6782 Cerebral ischemia: Secondary | ICD-10-CM | POA: Diagnosis not present

## 2016-02-02 DIAGNOSIS — I10 Essential (primary) hypertension: Secondary | ICD-10-CM | POA: Diagnosis not present

## 2016-02-02 DIAGNOSIS — E785 Hyperlipidemia, unspecified: Secondary | ICD-10-CM | POA: Diagnosis not present

## 2016-02-02 DIAGNOSIS — Z01818 Encounter for other preprocedural examination: Secondary | ICD-10-CM | POA: Diagnosis not present

## 2016-02-02 DIAGNOSIS — R200 Anesthesia of skin: Secondary | ICD-10-CM | POA: Diagnosis not present

## 2016-02-07 DIAGNOSIS — Z8673 Personal history of transient ischemic attack (TIA), and cerebral infarction without residual deficits: Secondary | ICD-10-CM | POA: Diagnosis not present

## 2016-02-07 DIAGNOSIS — N183 Chronic kidney disease, stage 3 (moderate): Secondary | ICD-10-CM | POA: Diagnosis not present

## 2016-02-07 DIAGNOSIS — F3341 Major depressive disorder, recurrent, in partial remission: Secondary | ICD-10-CM | POA: Diagnosis not present

## 2016-02-07 DIAGNOSIS — I129 Hypertensive chronic kidney disease with stage 1 through stage 4 chronic kidney disease, or unspecified chronic kidney disease: Secondary | ICD-10-CM | POA: Diagnosis not present

## 2016-02-07 DIAGNOSIS — E785 Hyperlipidemia, unspecified: Secondary | ICD-10-CM | POA: Diagnosis not present

## 2016-02-12 DIAGNOSIS — R200 Anesthesia of skin: Secondary | ICD-10-CM | POA: Diagnosis not present

## 2016-02-12 DIAGNOSIS — I6930 Unspecified sequelae of cerebral infarction: Secondary | ICD-10-CM | POA: Diagnosis not present

## 2016-02-12 DIAGNOSIS — R0681 Apnea, not elsewhere classified: Secondary | ICD-10-CM | POA: Diagnosis not present

## 2016-03-20 DIAGNOSIS — R0683 Snoring: Secondary | ICD-10-CM | POA: Diagnosis not present

## 2016-03-20 DIAGNOSIS — G4712 Idiopathic hypersomnia without long sleep time: Secondary | ICD-10-CM | POA: Diagnosis not present

## 2016-03-20 DIAGNOSIS — R0681 Apnea, not elsewhere classified: Secondary | ICD-10-CM | POA: Diagnosis not present

## 2016-03-27 DIAGNOSIS — F3341 Major depressive disorder, recurrent, in partial remission: Secondary | ICD-10-CM | POA: Diagnosis not present

## 2016-08-12 DIAGNOSIS — G4733 Obstructive sleep apnea (adult) (pediatric): Secondary | ICD-10-CM | POA: Diagnosis not present

## 2016-08-12 DIAGNOSIS — I10 Essential (primary) hypertension: Secondary | ICD-10-CM | POA: Diagnosis not present

## 2016-08-12 DIAGNOSIS — Z0000 Encounter for general adult medical examination without abnormal findings: Secondary | ICD-10-CM | POA: Diagnosis not present

## 2017-03-27 DIAGNOSIS — H40033 Anatomical narrow angle, bilateral: Secondary | ICD-10-CM | POA: Diagnosis not present

## 2017-03-27 DIAGNOSIS — H2513 Age-related nuclear cataract, bilateral: Secondary | ICD-10-CM | POA: Diagnosis not present

## 2017-05-23 DIAGNOSIS — H25811 Combined forms of age-related cataract, right eye: Secondary | ICD-10-CM | POA: Diagnosis not present

## 2017-05-23 DIAGNOSIS — H25812 Combined forms of age-related cataract, left eye: Secondary | ICD-10-CM | POA: Diagnosis not present

## 2017-05-23 DIAGNOSIS — H2512 Age-related nuclear cataract, left eye: Secondary | ICD-10-CM | POA: Diagnosis not present

## 2017-05-23 DIAGNOSIS — H02889 Meibomian gland dysfunction of unspecified eye, unspecified eyelid: Secondary | ICD-10-CM | POA: Diagnosis not present

## 2017-05-23 DIAGNOSIS — H02831 Dermatochalasis of right upper eyelid: Secondary | ICD-10-CM | POA: Diagnosis not present

## 2017-06-05 DIAGNOSIS — H2511 Age-related nuclear cataract, right eye: Secondary | ICD-10-CM | POA: Diagnosis not present

## 2017-06-05 DIAGNOSIS — H25811 Combined forms of age-related cataract, right eye: Secondary | ICD-10-CM | POA: Diagnosis not present

## 2017-06-18 ENCOUNTER — Other Ambulatory Visit: Payer: Self-pay | Admitting: Family Medicine

## 2017-06-20 DIAGNOSIS — Z049 Encounter for examination and observation for unspecified reason: Secondary | ICD-10-CM | POA: Diagnosis not present

## 2017-06-20 DIAGNOSIS — R0602 Shortness of breath: Secondary | ICD-10-CM | POA: Diagnosis not present

## 2017-06-20 DIAGNOSIS — R079 Chest pain, unspecified: Secondary | ICD-10-CM | POA: Diagnosis not present

## 2017-06-23 ENCOUNTER — Other Ambulatory Visit: Payer: Self-pay

## 2017-06-23 ENCOUNTER — Ambulatory Visit: Payer: PPO | Admitting: Cardiology

## 2017-07-04 ENCOUNTER — Other Ambulatory Visit: Payer: Self-pay | Admitting: Family Medicine

## 2017-09-03 ENCOUNTER — Inpatient Hospital Stay (HOSPITAL_COMMUNITY)
Admission: EM | Admit: 2017-09-03 | Discharge: 2017-10-09 | DRG: 003 | Disposition: A | Discharge status: Other Acute Inpatient Hospital | Payer: PPO | Attending: Internal Medicine | Admitting: Internal Medicine

## 2017-09-03 ENCOUNTER — Encounter (HOSPITAL_COMMUNITY): Payer: Self-pay | Admitting: Emergency Medicine

## 2017-09-03 ENCOUNTER — Emergency Department (HOSPITAL_COMMUNITY): Payer: PPO

## 2017-09-03 DIAGNOSIS — D6489 Other specified anemias: Secondary | ICD-10-CM | POA: Diagnosis not present

## 2017-09-03 DIAGNOSIS — Y95 Nosocomial condition: Secondary | ICD-10-CM | POA: Diagnosis present

## 2017-09-03 DIAGNOSIS — K432 Incisional hernia without obstruction or gangrene: Secondary | ICD-10-CM | POA: Diagnosis not present

## 2017-09-03 DIAGNOSIS — K55069 Acute infarction of intestine, part and extent unspecified: Secondary | ICD-10-CM | POA: Diagnosis present

## 2017-09-03 DIAGNOSIS — J440 Chronic obstructive pulmonary disease with acute lower respiratory infection: Secondary | ICD-10-CM | POA: Diagnosis present

## 2017-09-03 DIAGNOSIS — I824Z2 Acute embolism and thrombosis of unspecified deep veins of left distal lower extremity: Secondary | ICD-10-CM

## 2017-09-03 DIAGNOSIS — I82622 Acute embolism and thrombosis of deep veins of left upper extremity: Secondary | ICD-10-CM | POA: Diagnosis not present

## 2017-09-03 DIAGNOSIS — J9601 Acute respiratory failure with hypoxia: Secondary | ICD-10-CM

## 2017-09-03 DIAGNOSIS — I5020 Unspecified systolic (congestive) heart failure: Secondary | ICD-10-CM | POA: Diagnosis not present

## 2017-09-03 DIAGNOSIS — E878 Other disorders of electrolyte and fluid balance, not elsewhere classified: Secondary | ICD-10-CM | POA: Diagnosis not present

## 2017-09-03 DIAGNOSIS — E785 Hyperlipidemia, unspecified: Secondary | ICD-10-CM | POA: Diagnosis present

## 2017-09-03 DIAGNOSIS — A419 Sepsis, unspecified organism: Secondary | ICD-10-CM | POA: Diagnosis not present

## 2017-09-03 DIAGNOSIS — K55059 Acute (reversible) ischemia of intestine, part and extent unspecified: Secondary | ICD-10-CM | POA: Diagnosis present

## 2017-09-03 DIAGNOSIS — E872 Acidosis: Secondary | ICD-10-CM | POA: Diagnosis present

## 2017-09-03 DIAGNOSIS — R1013 Epigastric pain: Secondary | ICD-10-CM | POA: Diagnosis not present

## 2017-09-03 DIAGNOSIS — T8131XA Disruption of external operation (surgical) wound, not elsewhere classified, initial encounter: Secondary | ICD-10-CM | POA: Diagnosis not present

## 2017-09-03 DIAGNOSIS — K55049 Acute infarction of large intestine, extent unspecified: Secondary | ICD-10-CM | POA: Diagnosis present

## 2017-09-03 DIAGNOSIS — I2510 Atherosclerotic heart disease of native coronary artery without angina pectoris: Secondary | ICD-10-CM | POA: Diagnosis present

## 2017-09-03 DIAGNOSIS — I252 Old myocardial infarction: Secondary | ICD-10-CM

## 2017-09-03 DIAGNOSIS — R109 Unspecified abdominal pain: Secondary | ICD-10-CM | POA: Diagnosis not present

## 2017-09-03 DIAGNOSIS — I491 Atrial premature depolarization: Secondary | ICD-10-CM | POA: Diagnosis not present

## 2017-09-03 DIAGNOSIS — J9621 Acute and chronic respiratory failure with hypoxia: Secondary | ICD-10-CM | POA: Diagnosis not present

## 2017-09-03 DIAGNOSIS — Z9911 Dependence on respirator [ventilator] status: Secondary | ICD-10-CM

## 2017-09-03 DIAGNOSIS — N179 Acute kidney failure, unspecified: Secondary | ICD-10-CM | POA: Diagnosis present

## 2017-09-03 DIAGNOSIS — K2970 Gastritis, unspecified, without bleeding: Secondary | ICD-10-CM | POA: Diagnosis not present

## 2017-09-03 DIAGNOSIS — R6521 Severe sepsis with septic shock: Secondary | ICD-10-CM | POA: Diagnosis present

## 2017-09-03 DIAGNOSIS — Z789 Other specified health status: Secondary | ICD-10-CM

## 2017-09-03 DIAGNOSIS — K559 Vascular disorder of intestine, unspecified: Secondary | ICD-10-CM

## 2017-09-03 DIAGNOSIS — E669 Obesity, unspecified: Secondary | ICD-10-CM | POA: Diagnosis present

## 2017-09-03 DIAGNOSIS — K279 Peptic ulcer, site unspecified, unspecified as acute or chronic, without hemorrhage or perforation: Secondary | ICD-10-CM | POA: Diagnosis present

## 2017-09-03 DIAGNOSIS — Z419 Encounter for procedure for purposes other than remedying health state, unspecified: Secondary | ICD-10-CM

## 2017-09-03 DIAGNOSIS — I110 Hypertensive heart disease with heart failure: Secondary | ICD-10-CM | POA: Diagnosis present

## 2017-09-03 DIAGNOSIS — I10 Essential (primary) hypertension: Secondary | ICD-10-CM | POA: Diagnosis present

## 2017-09-03 DIAGNOSIS — Z7982 Long term (current) use of aspirin: Secondary | ICD-10-CM

## 2017-09-03 DIAGNOSIS — J9620 Acute and chronic respiratory failure, unspecified whether with hypoxia or hypercapnia: Secondary | ICD-10-CM

## 2017-09-03 DIAGNOSIS — Z4659 Encounter for fitting and adjustment of other gastrointestinal appliance and device: Secondary | ICD-10-CM

## 2017-09-03 DIAGNOSIS — T85598D Other mechanical complication of other gastrointestinal prosthetic devices, implants and grafts, subsequent encounter: Secondary | ICD-10-CM

## 2017-09-03 DIAGNOSIS — R1033 Periumbilical pain: Secondary | ICD-10-CM | POA: Diagnosis not present

## 2017-09-03 DIAGNOSIS — G9340 Encephalopathy, unspecified: Secondary | ICD-10-CM | POA: Diagnosis not present

## 2017-09-03 DIAGNOSIS — I7090 Unspecified atherosclerosis: Secondary | ICD-10-CM | POA: Diagnosis present

## 2017-09-03 DIAGNOSIS — I4901 Ventricular fibrillation: Secondary | ICD-10-CM | POA: Diagnosis not present

## 2017-09-03 DIAGNOSIS — Z8673 Personal history of transient ischemic attack (TIA), and cerebral infarction without residual deficits: Secondary | ICD-10-CM

## 2017-09-03 DIAGNOSIS — E43 Unspecified severe protein-calorie malnutrition: Secondary | ICD-10-CM

## 2017-09-03 DIAGNOSIS — I462 Cardiac arrest due to underlying cardiac condition: Secondary | ICD-10-CM | POA: Diagnosis not present

## 2017-09-03 DIAGNOSIS — Y92230 Patient room in hospital as the place of occurrence of the external cause: Secondary | ICD-10-CM | POA: Diagnosis not present

## 2017-09-03 DIAGNOSIS — G8194 Hemiplegia, unspecified affecting left nondominant side: Secondary | ICD-10-CM | POA: Diagnosis not present

## 2017-09-03 DIAGNOSIS — T426X5A Adverse effect of other antiepileptic and sedative-hypnotic drugs, initial encounter: Secondary | ICD-10-CM | POA: Diagnosis not present

## 2017-09-03 DIAGNOSIS — I2699 Other pulmonary embolism without acute cor pulmonale: Secondary | ICD-10-CM | POA: Diagnosis not present

## 2017-09-03 DIAGNOSIS — I700 Atherosclerosis of aorta: Secondary | ICD-10-CM | POA: Diagnosis present

## 2017-09-03 DIAGNOSIS — F17210 Nicotine dependence, cigarettes, uncomplicated: Secondary | ICD-10-CM | POA: Diagnosis present

## 2017-09-03 DIAGNOSIS — R571 Hypovolemic shock: Secondary | ICD-10-CM | POA: Diagnosis present

## 2017-09-03 DIAGNOSIS — R778 Other specified abnormalities of plasma proteins: Secondary | ICD-10-CM

## 2017-09-03 DIAGNOSIS — Z9889 Other specified postprocedural states: Secondary | ICD-10-CM

## 2017-09-03 DIAGNOSIS — I469 Cardiac arrest, cause unspecified: Secondary | ICD-10-CM

## 2017-09-03 DIAGNOSIS — I471 Supraventricular tachycardia: Secondary | ICD-10-CM | POA: Diagnosis not present

## 2017-09-03 DIAGNOSIS — E876 Hypokalemia: Secondary | ICD-10-CM | POA: Diagnosis not present

## 2017-09-03 DIAGNOSIS — J9600 Acute respiratory failure, unspecified whether with hypoxia or hypercapnia: Secondary | ICD-10-CM

## 2017-09-03 DIAGNOSIS — R7989 Other specified abnormal findings of blood chemistry: Secondary | ICD-10-CM

## 2017-09-03 DIAGNOSIS — I214 Non-ST elevation (NSTEMI) myocardial infarction: Secondary | ICD-10-CM

## 2017-09-03 DIAGNOSIS — I708 Atherosclerosis of other arteries: Secondary | ICD-10-CM | POA: Diagnosis present

## 2017-09-03 DIAGNOSIS — Z800 Family history of malignant neoplasm of digestive organs: Secondary | ICD-10-CM

## 2017-09-03 DIAGNOSIS — Z6841 Body Mass Index (BMI) 40.0 and over, adult: Secondary | ICD-10-CM

## 2017-09-03 DIAGNOSIS — Z8711 Personal history of peptic ulcer disease: Secondary | ICD-10-CM

## 2017-09-03 DIAGNOSIS — I82412 Acute embolism and thrombosis of left femoral vein: Secondary | ICD-10-CM | POA: Diagnosis not present

## 2017-09-03 DIAGNOSIS — E870 Hyperosmolality and hypernatremia: Secondary | ICD-10-CM | POA: Diagnosis not present

## 2017-09-03 DIAGNOSIS — Z8249 Family history of ischemic heart disease and other diseases of the circulatory system: Secondary | ICD-10-CM

## 2017-09-03 DIAGNOSIS — R110 Nausea: Secondary | ICD-10-CM | POA: Diagnosis not present

## 2017-09-03 DIAGNOSIS — R739 Hyperglycemia, unspecified: Secondary | ICD-10-CM | POA: Diagnosis present

## 2017-09-03 DIAGNOSIS — I480 Paroxysmal atrial fibrillation: Secondary | ICD-10-CM | POA: Diagnosis not present

## 2017-09-03 DIAGNOSIS — J9690 Respiratory failure, unspecified, unspecified whether with hypoxia or hypercapnia: Secondary | ICD-10-CM

## 2017-09-03 DIAGNOSIS — K5730 Diverticulosis of large intestine without perforation or abscess without bleeding: Secondary | ICD-10-CM | POA: Diagnosis present

## 2017-09-03 DIAGNOSIS — R000 Tachycardia, unspecified: Secondary | ICD-10-CM | POA: Diagnosis not present

## 2017-09-03 DIAGNOSIS — Z79899 Other long term (current) drug therapy: Secondary | ICD-10-CM

## 2017-09-03 DIAGNOSIS — Q549 Hypospadias, unspecified: Secondary | ICD-10-CM

## 2017-09-03 DIAGNOSIS — N261 Atrophy of kidney (terminal): Secondary | ICD-10-CM | POA: Diagnosis present

## 2017-09-03 DIAGNOSIS — I82621 Acute embolism and thrombosis of deep veins of right upper extremity: Secondary | ICD-10-CM | POA: Diagnosis not present

## 2017-09-03 DIAGNOSIS — J155 Pneumonia due to Escherichia coli: Secondary | ICD-10-CM | POA: Diagnosis present

## 2017-09-03 LAB — COMPREHENSIVE METABOLIC PANEL
ALBUMIN: 4 g/dL (ref 3.5–5.0)
ALT: 16 U/L — ABNORMAL LOW (ref 17–63)
AST: 25 U/L (ref 15–41)
Alkaline Phosphatase: 104 U/L (ref 38–126)
Anion gap: 15 (ref 5–15)
BUN: 16 mg/dL (ref 6–20)
CHLORIDE: 101 mmol/L (ref 101–111)
CO2: 22 mmol/L (ref 22–32)
Calcium: 9.2 mg/dL (ref 8.9–10.3)
Creatinine, Ser: 1.44 mg/dL — ABNORMAL HIGH (ref 0.61–1.24)
GFR calc Af Amer: 57 mL/min — ABNORMAL LOW (ref >60)
GFR calc non Af Amer: 49 mL/min — ABNORMAL LOW (ref >60)
GLUCOSE: 172 mg/dL — AB (ref 65–99)
POTASSIUM: 4 mmol/L (ref 3.5–5.1)
SODIUM: 138 mmol/L (ref 135–145)
Total Bilirubin: 0.6 mg/dL (ref 0.3–1.2)
Total Protein: 8.4 g/dL — ABNORMAL HIGH (ref 6.5–8.1)

## 2017-09-03 LAB — URINALYSIS, ROUTINE W REFLEX MICROSCOPIC
Bilirubin Urine: NEGATIVE
GLUCOSE, UA: NEGATIVE mg/dL
HGB URINE DIPSTICK: NEGATIVE
KETONES UR: NEGATIVE mg/dL
LEUKOCYTES UA: NEGATIVE
Nitrite: NEGATIVE
PH: 5 (ref 5.0–8.0)
Protein, ur: NEGATIVE mg/dL
Specific Gravity, Urine: 1.032 — ABNORMAL HIGH (ref 1.005–1.030)

## 2017-09-03 LAB — CBC
HEMATOCRIT: 48.7 % (ref 39.0–52.0)
Hemoglobin: 16.7 g/dL (ref 13.0–17.0)
MCH: 30.6 pg (ref 26.0–34.0)
MCHC: 34.3 g/dL (ref 30.0–36.0)
MCV: 89.2 fL (ref 78.0–100.0)
Platelets: 263 10*3/uL (ref 150–400)
RBC: 5.46 MIL/uL (ref 4.22–5.81)
RDW: 13.5 % (ref 11.5–15.5)
WBC: 11.4 10*3/uL — AB (ref 4.0–10.5)

## 2017-09-03 LAB — I-STAT CG4 LACTIC ACID, ED: Lactic Acid, Venous: 3.47 mmol/L (ref 0.5–1.9)

## 2017-09-03 LAB — LIPASE, BLOOD: LIPASE: 37 U/L (ref 11–51)

## 2017-09-03 MED ORDER — ONDANSETRON HCL 4 MG/2ML IJ SOLN
4.0000 mg | Freq: Once | INTRAMUSCULAR | Status: AC
  Administered 2017-09-03: 4 mg via INTRAVENOUS
  Filled 2017-09-03: qty 2

## 2017-09-03 MED ORDER — MORPHINE SULFATE (PF) 4 MG/ML IV SOLN
4.0000 mg | Freq: Once | INTRAVENOUS | Status: AC
  Administered 2017-09-03: 4 mg via INTRAVENOUS
  Filled 2017-09-03: qty 1

## 2017-09-03 MED ORDER — IOPAMIDOL (ISOVUE-300) INJECTION 61%
INTRAVENOUS | Status: AC
  Filled 2017-09-03: qty 100

## 2017-09-03 MED ORDER — ONDANSETRON 4 MG PO TBDP
4.0000 mg | ORAL_TABLET | Freq: Once | ORAL | Status: AC | PRN
  Administered 2017-09-03: 4 mg via ORAL
  Filled 2017-09-03: qty 1

## 2017-09-03 MED ORDER — SODIUM CHLORIDE 0.9 % IV BOLUS
1000.0000 mL | Freq: Once | INTRAVENOUS | Status: AC
  Administered 2017-09-03: 1000 mL via INTRAVENOUS

## 2017-09-03 MED ORDER — IOPAMIDOL (ISOVUE-300) INJECTION 61%
100.0000 mL | Freq: Once | INTRAVENOUS | Status: AC | PRN
  Administered 2017-09-03: 100 mL via INTRAVENOUS

## 2017-09-03 NOTE — ED Notes (Signed)
Pt has a lactic of 3.47 EDP Cook made aware.

## 2017-09-03 NOTE — ED Notes (Signed)
Bp has to be done on right arm he has a blood restriction in his left arm

## 2017-09-03 NOTE — ED Provider Notes (Addendum)
[facility] DEPT Provider Note   CSN: [id number] Arrival date & time: 09/03/17  1623  History   Chief Complaint Chief Complaint  Patient presents with  . Abdominal Pain  . Nausea    HPI Luke Kaufman is a 67 y.o. male h/o PVD, atherosclerosis of distal aorta, CAD, CVA, HTN, HLD, tobacco use, ulcers with GI bleeding here for evaluation of periumbilical, epigastric abdominal pain.  Onset around 1 PM, sudden.  Pain is described as achy, constant.  Pain intermittently radiates into his back.  Associated symptoms include nausea, dry heaving, he also broke out in a cold sweat.  Unsure about provoking factors, unsure if worse with oral intake. No recent forceful vomiting.  Occasional EtOH abuse.  Tobacco use.  Denies illicit drugs.  No use of NSAIDs, Goody powders, aspirin.  He denies radiation into the chest, chest pain, shortness of breath, cough, vomiting, changes in bowel movements, urinary symptoms.  No previous abdominal surgeries.  Does not take anything for ulcers over-the-counter.  HPI  Past Medical History:  Diagnosis Date  . Gastric ulcer   . Hyperlipemia   . Hypertension   . MI (myocardial infarction) ([place])   . Stroke ([place])   . Tobacco abuse     Patient Active Problem List   Diagnosis Date Noted  . Left arm numbness 12/14/2015  . Numbness 12/14/2015  . Bilateral carotid artery stenosis 11/22/2015  . Recurrent major depressive disorder in partial remission ([place]) 10/19/2015  . Mitral valve prolapse 09/07/2015  . Vitamin D deficiency 08/31/2015  . Arterial atherosclerosis 04/10/2015  . Cataract 04/10/2015  . Cerebrovascular accident, old 04/10/2015  . Gonalgia 04/10/2015  . HLD (hyperlipidemia) 04/10/2015  . 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 Renal artery stenosis ([place]) 04/10/2015  . History of non-ST elevation myocardial infarction (NSTEMI) 04/10/2015  . Anemia associated with acute blood loss 08/24/2014  . Thrombocytopenia ([place])     . Hypokalemia   . Essential hypertension 08/09/2014  . Chest pain 08/09/2014  . Near syncope 08/09/2014  . CKD (chronic kidney disease) stage 3, GFR 30-59 ml/min (HCC) 08/09/2014  . Tobacco abuse 08/09/2014  . Upper GI bleeding   . Gastric ulcer with hemorrhage   . Acute blood loss anemia   . CVA (cerebral infarction) 05/16/2013  . HTN (hypertension) 05/16/2013  . 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    Past Surgical History:  Procedure Laterality Date  . ESOPHAGOGASTRODUODENOSCOPY N/A 08/09/2014   Procedure: ESOPHAGOGASTRODUODENOSCOPY (EGD);  Surgeon: Jerene Bears, MD;  Location: [facility];  Service: Gastroenterology;  Laterality: N/A;  . ESOPHAGOGASTRODUODENOSCOPY N/A 08/10/2014   Procedure: ESOPHAGOGASTRODUODENOSCOPY (EGD);  Surgeon: Jerene Bears, MD;  Location: [facility];  Service: Gastroenterology;  Laterality: N/A;        Home Medications    Prior to Admission medications   Medication Sig Start Date End Date Taking? Authorizing Provider  amLODipine (NORVASC) 10 MG tablet Take 1 tablet (10 mg total) by mouth daily. 05/19/13  Yes Thurnell Lose, MD  aspirin EC 81 MG tablet Take 81 mg by mouth every 6 (six) hours as needed for moderate pain.   Yes [provider]  atorvastatin (LIPITOR) 40 MG tablet Take 40 mg by mouth daily. 06/23/17  Yes [provider]  gemfibrozil (LOPID) 600 MG tablet Take 600 mg by mouth 2 (two) times daily before a meal.  06/20/17  Yes [provider]  metoprolol tartrate (LOPRESSOR) 100 MG tablet Take 100 mg by mouth 2 (two) times daily. 07/04/17  Yes [provider]  triamterene-hydrochlorothiazide (MAXZIDE-25) 37.5-25 MG tablet TAKE 1 TABLET BY MOUTH DAILY 05/15/16  Yes [provider]    Family History Family History  Problem Relation Age of Onset  . Pancreatic cancer Mother   . Heart attack Father 63    Social History Social History   Tobacco Use  . Smoking status: Current Every Day Smoker     Packs/day: 1.50    Types: Cigarettes  . Smokeless tobacco: Never Used  . Tobacco comment: He was taking Chantix but had nightmares.  Substance Use Topics  . Alcohol use: Yes    Alcohol/week: 0.0 oz    Comment: occasional  . Drug use: No     Allergies   Patient has no known allergies.   Review of Systems Review of Systems  Gastrointestinal: Positive for abdominal pain and nausea.  All other systems reviewed and are negative.    Physical Exam Updated Vital Signs BP 122/82   Pulse (!) 113   Temp 97.6 F (36.4 C) (Oral)   Resp 18   SpO2 97%   Physical Exam  Constitutional: He is oriented to person, place, and time. He appears well-developed and well-nourished. No distress.  Non toxic.  HENT:  Head: Normocephalic and atraumatic.  Nose: Nose normal.  Moist mucous membranes   Eyes: Pupils are equal, round, and reactive to light. Conjunctivae and EOM are normal.  Neck: Normal range of motion.  Cardiovascular: Normal rate, regular rhythm, normal heart sounds and intact distal pulses.  No murmur heard. Pulses:      Radial pulses are 2+ on the right side, and 1+ on the left side.       Dorsalis pedis pulses are 2+ on the right side, and 1+ on the left side.  Decreased pulses on left. No LE edema.   Pulmonary/Chest: Effort normal and breath sounds normal.  Abdominal: Soft. Bowel sounds are normal. There is tenderness in the epigastric area and periumbilical area.  Mild tenderness to epigastric periumbilical areas. No G/R/R. No suprapubic or CVA tenderness. Negative Murphy's and McBurney's.   Musculoskeletal: Normal range of motion.  Neurological: He is alert and oriented to person, place, and time.  Skin: Skin is warm and dry. Capillary refill takes less than 2 seconds.  Psychiatric: He has a normal mood and affect. His behavior is normal. Judgment and thought content normal.  Nursing note and vitals reviewed.   ED Treatments / Results  Labs (all labs ordered are  listed, but only abnormal results are displayed) Labs Reviewed  COMPREHENSIVE METABOLIC PANEL - Abnormal; Notable for the following components:      Result Value   Glucose, Bld 172 (*)    Creatinine, Ser 1.44 (*)    Total Protein 8.4 (*)    ALT 16 (*)    GFR calc non Af Amer 49 (*)    GFR calc Af Amer 57 (*)    All other components within normal limits  CBC - Abnormal; Notable for the following components:   WBC 11.4 (*)    All other components within normal limits  URINALYSIS, ROUTINE W REFLEX MICROSCOPIC - Abnormal; Notable for the following components:   Specific Gravity, Urine 1.032 (*)    All other components within normal limits  I-STAT CG4 LACTIC ACID, ED - Abnormal; Notable for the following components:   Lactic Acid, Venous 3.47 (*)    All other components within normal limits  LIPASE, BLOOD    EKG None  Radiology Ct  Abdomen Pelvis W Contrast  Result Date: 09/03/2017 CLINICAL DATA:  Abdominal pain with nausea history of ulcer EXAM: CT ABDOMEN AND PELVIS WITH CONTRAST TECHNIQUE: Multidetector CT imaging of the abdomen and pelvis was performed using the standard protocol following bolus administration of intravenous contrast. CONTRAST:  100 cc Isovue-300 intravenous COMPARISON:  Renal ultrasound 05/19/2013 FINDINGS: Lower chest: Lung bases demonstrate patchy dependent atelectasis on the right. No pleural effusion. Heart size upper normal. Coronary vascular calcification. Small hiatal hernia. Hepatobiliary: Peripherally distributed gas in the caudate and left hepatic lobe consistent with portal venous gas. No calcified gallstones. No biliary dilatation Pancreas: Fatty atrophy with no inflammation Spleen: Normal in size without focal abnormality. Adrenals/Urinary Tract: Adrenal glands are within normal limits. Atrophic left kidney. No hydronephrosis. Bladder is normal Stomach/Bowel: Stomach is nonenlarged. No dilated small bowel. No colon wall thickening. Negative appendix. Sigmoid  colon diverticular disease without acute inflammation Vascular/Lymphatic: Moderate severe aortic atherosclerosis. No aneurysm. No significantly enlarged lymph nodes. Reproductive: Enlarged prostate Other: Negative for free air or free fluid. Musculoskeletal: Degenerative changes of the spine. No acute or suspicious abnormality IMPRESSION: 1. Portal venous gas in the caudate and left hepatic lobe, uncertain source or etiology. No areas of abnormal bowel wall thickening or intramural air are visualized. Suggest correlation with lactic acid levels. 2. 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 Atrophic left kidney Electronically Signed   By: Donavan Foil M.D.   On: 09/03/2017 21:36    Procedures Procedures (including critical care time)  Medications Ordered in ED Medications  morphine 4 MG/ML injection 4 mg (0 mg Intravenous Hold 09/03/17 2338)  ondansetron (ZOFRAN-ODT) disintegrating tablet 4 mg (4 mg Oral Given 09/03/17 1635)  sodium chloride 0.9 % bolus 1,000 mL (0 mLs Intravenous Stopped 09/03/17 2138)  iopamidol (ISOVUE-300) 61 % injection 100 mL (100 mLs Intravenous Contrast Given 09/03/17 2050)  ondansetron (ZOFRAN) injection 4 mg (4 mg Intravenous Given 09/03/17 2338)     Initial Impression / Assessment and Plan / ED Course  I have reviewed the triage vital signs and the nursing notes.  Pertinent labs & imaging results that were available during my care of the patient were reviewed by me and considered in my medical decision making (see chart for details).  Clinical Course as of Sep 03 2344  Wed Sep 03, 2017  1920 No med for ulcers    [CG]  2159 IMPRESSION: 1. Portal venous gas in the caudate and left hepatic lobe, uncertain source or etiology. No areas of abnormal bowel wall thickening or intramural air are visualized. Suggest correlation with lactic acid levels. 2. 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 Atrophic left kidney     CT  ABDOMEN PELVIS W CONTRAST [CG]  2228 Spoke to Dr. Francoise Ceo (radiology) states portal venous gas in liver may be from ischemic gut although no other convincing signs of this on Ct, however pt with significant vascular disease on CT and at higher risk. Recommending lactic acid level to correlate.  Pt discussed with Dr. Lacinda Axon who evaluated patient, leaning towards admission.    [CG]  2257 Spoke to GI (Dr Carlean Purl) who agrees with admission for obs.  Portal venous gas  generally a bad sign but not sensitive.  Will see pt in AM. Not recommending any interventions in ER including abx.   [CG]  2319 WBC(!): 11.4 [CG]  2319 Creatinine(!): 1.44 [CG]  2319 Lactic Acid, Venous(!!): 3.47 [CG]    Clinical Course User Index [CG] Kinnie Feil, PA-C  Ddx includes viral gastroenteritis, PUD, perforated viscus, esophageal rupture, AAA, SBO.  He has had 2 previous ulcers and abd pain is epigastric with radiation into back.  Has significant risk factors for ischemic gut including atherosclerosis of distal aorta, CVA, HTN, HLD, tobacco use.  He has no radiation or pain in chest, SOB so ACS unlikely.  2322: Labs and imaging reviewed, remarkable for portal venous gas in left hepatic lobe but no other abnormalities on bowel.  Lactic acid 3.47.  Concern for ischemic bowel given risk factors. On repeat evaluation pt continues to report vague, 8/10 abd pain.  Spoke to GI who recommends observation, will see in AM.  Spoke to hospitalist (Dr. Alcario Drought) who recommends vascular surgery consult.   Final Clinical Impressions(s) / ED Diagnoses   2330: Spoke to Dr Oneida Alar (vascular surgery) who recommends transfer to [facility] ER for evaluation, also recommending general surgery consult and admission. Pt discussed with Dr Alcario Drought who will accept patient to SDU. Spoke to charge RN who will assist with ER to ER transfer to expedite transfer. Spoke to Dr Dina Rich who is aware of patient en route. Re-evaluated pt prior to transfer,  VSS. He is aware of plan of transfer and care. Accepting provider to page Dr Oneida Alar when pt arrives to ER. Dr Alcario Drought to place admitting orders to SDU.  Final diagnoses:  Periumbilical abdominal pain    ED Discharge Orders    None         Arlean Hopping 09/03/17 2347    Nat Christen, MD 09/06/17 [phone]

## 2017-09-03 NOTE — ED Notes (Signed)
Pt states he is unable to give a urine sample at this time. 

## 2017-09-03 NOTE — ED Triage Notes (Signed)
Per [facility] pt from home for abd pains with nausea that started today. Reports hx of ulcers. Denies vomiting or issues with bowels. Vitals: 140/70, 60HR, 18R, 93% on ra.

## 2017-09-03 NOTE — ED Notes (Signed)
Pt has a urinal at bedside 

## 2017-09-03 NOTE — ED Notes (Signed)
Linna Hoff, RN charge nurse at [facility] and called [facility] for transport

## 2017-09-03 NOTE — ED Notes (Signed)
Report given to [facility] 

## 2017-09-04 ENCOUNTER — Inpatient Hospital Stay (HOSPITAL_COMMUNITY): Payer: PPO

## 2017-09-04 ENCOUNTER — Inpatient Hospital Stay (HOSPITAL_COMMUNITY): Payer: PPO | Admitting: Anesthesiology

## 2017-09-04 ENCOUNTER — Encounter (HOSPITAL_COMMUNITY)
Admission: EM | Disposition: A | Discharge status: Nursing Home (Includes ICF) | Payer: Self-pay | Source: Home / Self Care | Attending: Pulmonary Disease

## 2017-09-04 DIAGNOSIS — T8131XA Disruption of external operation (surgical) wound, not elsewhere classified, initial encounter: Secondary | ICD-10-CM | POA: Diagnosis not present

## 2017-09-04 DIAGNOSIS — I214 Non-ST elevation (NSTEMI) myocardial infarction: Secondary | ICD-10-CM | POA: Diagnosis not present

## 2017-09-04 DIAGNOSIS — I4891 Unspecified atrial fibrillation: Secondary | ICD-10-CM | POA: Diagnosis not present

## 2017-09-04 DIAGNOSIS — R571 Hypovolemic shock: Secondary | ICD-10-CM | POA: Diagnosis not present

## 2017-09-04 DIAGNOSIS — Q6439 Other atresia and stenosis of urethra and bladder neck: Secondary | ICD-10-CM | POA: Diagnosis not present

## 2017-09-04 DIAGNOSIS — E785 Hyperlipidemia, unspecified: Secondary | ICD-10-CM | POA: Diagnosis not present

## 2017-09-04 DIAGNOSIS — J9622 Acute and chronic respiratory failure with hypercapnia: Secondary | ICD-10-CM | POA: Diagnosis not present

## 2017-09-04 DIAGNOSIS — K432 Incisional hernia without obstruction or gangrene: Secondary | ICD-10-CM | POA: Diagnosis not present

## 2017-09-04 DIAGNOSIS — Z4682 Encounter for fitting and adjustment of non-vascular catheter: Secondary | ICD-10-CM | POA: Diagnosis not present

## 2017-09-04 DIAGNOSIS — E7849 Other hyperlipidemia: Secondary | ICD-10-CM | POA: Diagnosis not present

## 2017-09-04 DIAGNOSIS — R109 Unspecified abdominal pain: Secondary | ICD-10-CM | POA: Diagnosis not present

## 2017-09-04 DIAGNOSIS — Z930 Tracheostomy status: Secondary | ICD-10-CM | POA: Diagnosis not present

## 2017-09-04 DIAGNOSIS — J431 Panlobular emphysema: Secondary | ICD-10-CM | POA: Diagnosis not present

## 2017-09-04 DIAGNOSIS — I82A11 Acute embolism and thrombosis of right axillary vein: Secondary | ICD-10-CM | POA: Diagnosis not present

## 2017-09-04 DIAGNOSIS — I82622 Acute embolism and thrombosis of deep veins of left upper extremity: Secondary | ICD-10-CM | POA: Diagnosis not present

## 2017-09-04 DIAGNOSIS — I480 Paroxysmal atrial fibrillation: Secondary | ICD-10-CM | POA: Diagnosis not present

## 2017-09-04 DIAGNOSIS — Z6841 Body Mass Index (BMI) 40.0 and over, adult: Secondary | ICD-10-CM | POA: Diagnosis not present

## 2017-09-04 DIAGNOSIS — R0602 Shortness of breath: Secondary | ICD-10-CM | POA: Diagnosis not present

## 2017-09-04 DIAGNOSIS — J90 Pleural effusion, not elsewhere classified: Secondary | ICD-10-CM | POA: Diagnosis not present

## 2017-09-04 DIAGNOSIS — I471 Supraventricular tachycardia: Secondary | ICD-10-CM | POA: Diagnosis not present

## 2017-09-04 DIAGNOSIS — I5020 Unspecified systolic (congestive) heart failure: Secondary | ICD-10-CM | POA: Diagnosis not present

## 2017-09-04 DIAGNOSIS — I2609 Other pulmonary embolism with acute cor pulmonale: Secondary | ICD-10-CM | POA: Diagnosis not present

## 2017-09-04 DIAGNOSIS — I2510 Atherosclerotic heart disease of native coronary artery without angina pectoris: Secondary | ICD-10-CM | POA: Diagnosis not present

## 2017-09-04 DIAGNOSIS — I2699 Other pulmonary embolism without acute cor pulmonale: Secondary | ICD-10-CM | POA: Diagnosis not present

## 2017-09-04 DIAGNOSIS — E43 Unspecified severe protein-calorie malnutrition: Secondary | ICD-10-CM | POA: Diagnosis not present

## 2017-09-04 DIAGNOSIS — R748 Abnormal levels of other serum enzymes: Secondary | ICD-10-CM | POA: Diagnosis not present

## 2017-09-04 DIAGNOSIS — Z481 Encounter for planned postprocedural wound closure: Secondary | ICD-10-CM | POA: Diagnosis not present

## 2017-09-04 DIAGNOSIS — J9601 Acute respiratory failure with hypoxia: Secondary | ICD-10-CM

## 2017-09-04 DIAGNOSIS — Z452 Encounter for adjustment and management of vascular access device: Secondary | ICD-10-CM | POA: Diagnosis not present

## 2017-09-04 DIAGNOSIS — N189 Chronic kidney disease, unspecified: Secondary | ICD-10-CM | POA: Diagnosis not present

## 2017-09-04 DIAGNOSIS — J9600 Acute respiratory failure, unspecified whether with hypoxia or hypercapnia: Secondary | ICD-10-CM | POA: Diagnosis not present

## 2017-09-04 DIAGNOSIS — Z8719 Personal history of other diseases of the digestive system: Secondary | ICD-10-CM | POA: Diagnosis not present

## 2017-09-04 DIAGNOSIS — A419 Sepsis, unspecified organism: Principal | ICD-10-CM

## 2017-09-04 DIAGNOSIS — R4182 Altered mental status, unspecified: Secondary | ICD-10-CM | POA: Diagnosis not present

## 2017-09-04 DIAGNOSIS — I82621 Acute embolism and thrombosis of deep veins of right upper extremity: Secondary | ICD-10-CM | POA: Diagnosis not present

## 2017-09-04 DIAGNOSIS — N183 Chronic kidney disease, stage 3 (moderate): Secondary | ICD-10-CM | POA: Diagnosis not present

## 2017-09-04 DIAGNOSIS — K55039 Acute (reversible) ischemia of large intestine, extent unspecified: Secondary | ICD-10-CM | POA: Diagnosis not present

## 2017-09-04 DIAGNOSIS — R6521 Severe sepsis with septic shock: Secondary | ICD-10-CM

## 2017-09-04 DIAGNOSIS — J9811 Atelectasis: Secondary | ICD-10-CM | POA: Diagnosis not present

## 2017-09-04 DIAGNOSIS — J9620 Acute and chronic respiratory failure, unspecified whether with hypoxia or hypercapnia: Secondary | ICD-10-CM | POA: Diagnosis not present

## 2017-09-04 DIAGNOSIS — J810 Acute pulmonary edema: Secondary | ICD-10-CM | POA: Diagnosis not present

## 2017-09-04 DIAGNOSIS — I82412 Acute embolism and thrombosis of left femoral vein: Secondary | ICD-10-CM | POA: Diagnosis not present

## 2017-09-04 DIAGNOSIS — R579 Shock, unspecified: Secondary | ICD-10-CM | POA: Diagnosis not present

## 2017-09-04 DIAGNOSIS — R1033 Periumbilical pain: Secondary | ICD-10-CM

## 2017-09-04 DIAGNOSIS — E876 Hypokalemia: Secondary | ICD-10-CM | POA: Diagnosis not present

## 2017-09-04 DIAGNOSIS — K55069 Acute infarction of intestine, part and extent unspecified: Secondary | ICD-10-CM | POA: Diagnosis not present

## 2017-09-04 DIAGNOSIS — G8194 Hemiplegia, unspecified affecting left nondominant side: Secondary | ICD-10-CM | POA: Diagnosis not present

## 2017-09-04 DIAGNOSIS — K55059 Acute (reversible) ischemia of intestine, part and extent unspecified: Secondary | ICD-10-CM | POA: Diagnosis not present

## 2017-09-04 DIAGNOSIS — G9340 Encephalopathy, unspecified: Secondary | ICD-10-CM | POA: Diagnosis not present

## 2017-09-04 DIAGNOSIS — I129 Hypertensive chronic kidney disease with stage 1 through stage 4 chronic kidney disease, or unspecified chronic kidney disease: Secondary | ICD-10-CM | POA: Diagnosis not present

## 2017-09-04 DIAGNOSIS — E870 Hyperosmolality and hypernatremia: Secondary | ICD-10-CM | POA: Diagnosis not present

## 2017-09-04 DIAGNOSIS — I469 Cardiac arrest, cause unspecified: Secondary | ICD-10-CM | POA: Diagnosis not present

## 2017-09-04 DIAGNOSIS — J9621 Acute and chronic respiratory failure with hypoxia: Secondary | ICD-10-CM | POA: Diagnosis not present

## 2017-09-04 DIAGNOSIS — I462 Cardiac arrest due to underlying cardiac condition: Secondary | ICD-10-CM | POA: Diagnosis not present

## 2017-09-04 DIAGNOSIS — T81599A Other complications of foreign body accidentally left in body following unspecified procedure, initial encounter: Secondary | ICD-10-CM | POA: Diagnosis not present

## 2017-09-04 DIAGNOSIS — R1084 Generalized abdominal pain: Secondary | ICD-10-CM | POA: Diagnosis not present

## 2017-09-04 DIAGNOSIS — Z9911 Dependence on respirator [ventilator] status: Secondary | ICD-10-CM | POA: Diagnosis not present

## 2017-09-04 DIAGNOSIS — D122 Benign neoplasm of ascending colon: Secondary | ICD-10-CM | POA: Diagnosis not present

## 2017-09-04 DIAGNOSIS — I10 Essential (primary) hypertension: Secondary | ICD-10-CM | POA: Diagnosis not present

## 2017-09-04 DIAGNOSIS — J440 Chronic obstructive pulmonary disease with acute lower respiratory infection: Secondary | ICD-10-CM | POA: Diagnosis not present

## 2017-09-04 DIAGNOSIS — I739 Peripheral vascular disease, unspecified: Secondary | ICD-10-CM | POA: Diagnosis not present

## 2017-09-04 DIAGNOSIS — J155 Pneumonia due to Escherichia coli: Secondary | ICD-10-CM | POA: Diagnosis not present

## 2017-09-04 DIAGNOSIS — I824Z2 Acute embolism and thrombosis of unspecified deep veins of left distal lower extremity: Secondary | ICD-10-CM | POA: Diagnosis not present

## 2017-09-04 DIAGNOSIS — K279 Peptic ulcer, site unspecified, unspecified as acute or chronic, without hemorrhage or perforation: Secondary | ICD-10-CM

## 2017-09-04 DIAGNOSIS — Y92230 Patient room in hospital as the place of occurrence of the external cause: Secondary | ICD-10-CM | POA: Diagnosis not present

## 2017-09-04 DIAGNOSIS — I708 Atherosclerosis of other arteries: Secondary | ICD-10-CM | POA: Diagnosis not present

## 2017-09-04 DIAGNOSIS — K631 Perforation of intestine (nontraumatic): Secondary | ICD-10-CM | POA: Diagnosis not present

## 2017-09-04 DIAGNOSIS — J9690 Respiratory failure, unspecified, unspecified whether with hypoxia or hypercapnia: Secondary | ICD-10-CM | POA: Diagnosis not present

## 2017-09-04 DIAGNOSIS — K55049 Acute infarction of large intestine, extent unspecified: Secondary | ICD-10-CM | POA: Diagnosis not present

## 2017-09-04 DIAGNOSIS — Y95 Nosocomial condition: Secondary | ICD-10-CM | POA: Diagnosis not present

## 2017-09-04 DIAGNOSIS — I639 Cerebral infarction, unspecified: Secondary | ICD-10-CM | POA: Diagnosis not present

## 2017-09-04 DIAGNOSIS — N179 Acute kidney failure, unspecified: Secondary | ICD-10-CM | POA: Diagnosis not present

## 2017-09-04 DIAGNOSIS — E7800 Pure hypercholesterolemia, unspecified: Secondary | ICD-10-CM | POA: Diagnosis not present

## 2017-09-04 DIAGNOSIS — E872 Acidosis: Secondary | ICD-10-CM | POA: Diagnosis not present

## 2017-09-04 DIAGNOSIS — S31109A Unspecified open wound of abdominal wall, unspecified quadrant without penetration into peritoneal cavity, initial encounter: Secondary | ICD-10-CM | POA: Diagnosis not present

## 2017-09-04 DIAGNOSIS — K559 Vascular disorder of intestine, unspecified: Secondary | ICD-10-CM | POA: Diagnosis not present

## 2017-09-04 DIAGNOSIS — G9341 Metabolic encephalopathy: Secondary | ICD-10-CM | POA: Diagnosis not present

## 2017-09-04 DIAGNOSIS — I4901 Ventricular fibrillation: Secondary | ICD-10-CM | POA: Diagnosis not present

## 2017-09-04 HISTORY — PX: VISCERAL ANGIOGRAM: SHX5515

## 2017-09-04 HISTORY — PX: BYPASS GRAFT AORTA TO AORTA: SHX6294

## 2017-09-04 HISTORY — PX: APPLICATION OF WOUND VAC: SHX5189

## 2017-09-04 HISTORY — PX: LAPAROTOMY: SHX154

## 2017-09-04 LAB — POCT I-STAT 7, (LYTES, BLD GAS, ICA,H+H)
ACID-BASE DEFICIT: 9 mmol/L — AB (ref 0.0–2.0)
ACID-BASE DEFICIT: 6 mmol/L — AB (ref 0.0–2.0)
Acid-base deficit: 7 mmol/L — ABNORMAL HIGH (ref 0.0–2.0)
Acid-base deficit: 7 mmol/L — ABNORMAL HIGH (ref 0.0–2.0)
Acid-base deficit: 7 mmol/L — ABNORMAL HIGH (ref 0.0–2.0)
BICARBONATE: 21.8 mmol/L (ref 20.0–28.0)
BICARBONATE: 20 mmol/L (ref 20.0–28.0)
Bicarbonate: 14 mmol/L — ABNORMAL LOW (ref 20.0–28.0)
Bicarbonate: 18.2 mmol/L — ABNORMAL LOW (ref 20.0–28.0)
Bicarbonate: 18.6 mmol/L — ABNORMAL LOW (ref 20.0–28.0)
CALCIUM ION: 1.04 mmol/L — AB (ref 1.15–1.40)
Calcium, Ion: 0.9 mmol/L — ABNORMAL LOW (ref 1.15–1.40)
Calcium, Ion: 0.96 mmol/L — ABNORMAL LOW (ref 1.15–1.40)
Calcium, Ion: 1.02 mmol/L — ABNORMAL LOW (ref 1.15–1.40)
Calcium, Ion: 1.08 mmol/L — ABNORMAL LOW (ref 1.15–1.40)
HCT: 32 % — ABNORMAL LOW (ref 39.0–52.0)
HCT: 38 % — ABNORMAL LOW (ref 39.0–52.0)
HCT: 30 % — ABNORMAL LOW (ref 39.0–52.0)
HCT: 36 % — ABNORMAL LOW (ref 39.0–52.0)
HCT: 33 % — ABNORMAL LOW (ref 39.0–52.0)
HEMOGLOBIN: 10.2 g/dL — AB (ref 13.0–17.0)
Hemoglobin: 10.9 g/dL — ABNORMAL LOW (ref 13.0–17.0)
Hemoglobin: 12.9 g/dL — ABNORMAL LOW (ref 13.0–17.0)
Hemoglobin: 12.2 g/dL — ABNORMAL LOW (ref 13.0–17.0)
Hemoglobin: 11.2 g/dL — ABNORMAL LOW (ref 13.0–17.0)
O2 SAT: 100 %
O2 Saturation: 96 %
O2 Saturation: 99 %
O2 Saturation: 99 %
O2 Saturation: 100 %
PCO2 ART: 32.7 mmHg (ref 32.0–48.0)
PCO2 ART: 44.3 mmHg (ref 32.0–48.0)
PH ART: 7.39 (ref 7.350–7.450)
PH ART: 7.251 — AB (ref 7.350–7.450)
PO2 ART: 79 mmHg — AB (ref 83.0–108.0)
PO2 ART: 192 mmHg — AB (ref 83.0–108.0)
Patient temperature: 36.9
Potassium: 2.9 mmol/L — ABNORMAL LOW (ref 3.5–5.1)
Potassium: 3.8 mmol/L (ref 3.5–5.1)
Potassium: 4.3 mmol/L (ref 3.5–5.1)
Potassium: 4.4 mmol/L (ref 3.5–5.1)
Potassium: 4.7 mmol/L (ref 3.5–5.1)
SODIUM: 144 mmol/L (ref 135–145)
SODIUM: 138 mmol/L (ref 135–145)
Sodium: 136 mmol/L (ref 135–145)
Sodium: 137 mmol/L (ref 135–145)
Sodium: 138 mmol/L (ref 135–145)
TCO2: 15 mmol/L — ABNORMAL LOW (ref 22–32)
TCO2: 23 mmol/L (ref 22–32)
TCO2: 19 mmol/L — ABNORMAL LOW (ref 22–32)
TCO2: 21 mmol/L — AB (ref 22–32)
TCO2: 20 mmol/L — ABNORMAL LOW (ref 22–32)
pCO2 arterial: 23 mmHg — ABNORMAL LOW (ref 32.0–48.0)
pCO2 arterial: 49.6 mmHg — ABNORMAL HIGH (ref 32.0–48.0)
pCO2 arterial: 37.2 mmHg (ref 32.0–48.0)
pH, Arterial: 7.263 — ABNORMAL LOW (ref 7.350–7.450)
pH, Arterial: 7.353 (ref 7.350–7.450)
pH, Arterial: 7.307 — ABNORMAL LOW (ref 7.350–7.450)
pO2, Arterial: 160 mmHg — ABNORMAL HIGH (ref 83.0–108.0)
pO2, Arterial: 144 mmHg — ABNORMAL HIGH (ref 83.0–108.0)
pO2, Arterial: 261 mmHg — ABNORMAL HIGH (ref 83.0–108.0)

## 2017-09-04 LAB — POCT I-STAT 3, VENOUS BLOOD GAS (G3P V)
Acid-base deficit: 7 mmol/L — ABNORMAL HIGH (ref 0.0–2.0)
BICARBONATE: 19.6 mmol/L — AB (ref 20.0–28.0)
O2 Saturation: 92 %
PO2 VEN: 70 mmHg — AB (ref 32.0–45.0)
TCO2: 21 mmol/L — ABNORMAL LOW (ref 22–32)
pCO2, Ven: 40.6 mmHg — ABNORMAL LOW (ref 44.0–60.0)
pH, Ven: 7.291 (ref 7.250–7.430)

## 2017-09-04 LAB — CBC
HCT: 38.9 % — ABNORMAL LOW (ref 39.0–52.0)
HCT: 40.8 % (ref 39.0–52.0)
HEMOGLOBIN: 13 g/dL (ref 13.0–17.0)
Hemoglobin: 13.7 g/dL (ref 13.0–17.0)
MCH: 30.1 pg (ref 26.0–34.0)
MCH: 29.9 pg (ref 26.0–34.0)
MCHC: 33.4 g/dL (ref 30.0–36.0)
MCHC: 33.6 g/dL (ref 30.0–36.0)
MCV: 90 fL (ref 78.0–100.0)
MCV: 89.1 fL (ref 78.0–100.0)
PLATELETS: 178 10*3/uL (ref 150–400)
Platelets: 192 10*3/uL (ref 150–400)
RBC: 4.32 MIL/uL (ref 4.22–5.81)
RBC: 4.58 MIL/uL (ref 4.22–5.81)
RDW: 13.5 % (ref 11.5–15.5)
RDW: 13.9 % (ref 11.5–15.5)
WBC: 13.9 10*3/uL — AB (ref 4.0–10.5)
WBC: 10.1 10*3/uL (ref 4.0–10.5)

## 2017-09-04 LAB — I-STAT CG4 LACTIC ACID, ED: LACTIC ACID, VENOUS: 3.2 mmol/L — AB (ref 0.5–1.9)

## 2017-09-04 LAB — BASIC METABOLIC PANEL
ANION GAP: 10 (ref 5–15)
BUN: 16 mg/dL (ref 6–20)
CHLORIDE: 108 mmol/L (ref 101–111)
CO2: 21 mmol/L — ABNORMAL LOW (ref 22–32)
Calcium: 7.8 mg/dL — ABNORMAL LOW (ref 8.9–10.3)
Creatinine, Ser: 1.5 mg/dL — ABNORMAL HIGH (ref 0.61–1.24)
GFR calc Af Amer: 54 mL/min — ABNORMAL LOW (ref >60)
GFR, EST NON AFRICAN AMERICAN: 46 mL/min — AB (ref >60)
Glucose, Bld: 141 mg/dL — ABNORMAL HIGH (ref 65–99)
POTASSIUM: 4.3 mmol/L (ref 3.5–5.1)
Sodium: 139 mmol/L (ref 135–145)

## 2017-09-04 LAB — PROTIME-INR
INR: 1.6
PROTHROMBIN TIME: 18.9 s — AB (ref 11.4–15.2)

## 2017-09-04 LAB — PREPARE RBC (CROSSMATCH)

## 2017-09-04 LAB — GLUCOSE, CAPILLARY
GLUCOSE-CAPILLARY: 148 mg/dL — AB (ref 65–99)
Glucose-Capillary: 146 mg/dL — ABNORMAL HIGH (ref 65–99)
Glucose-Capillary: 143 mg/dL — ABNORMAL HIGH (ref 65–99)

## 2017-09-04 LAB — MRSA PCR SCREENING: MRSA by PCR: POSITIVE — AB

## 2017-09-04 LAB — ABO/RH: ABO/RH(D): O POS

## 2017-09-04 LAB — TROPONIN I: Troponin I: 0.5 ng/mL (ref <0.03)

## 2017-09-04 LAB — APTT: APTT: 56 s — AB (ref 24–36)

## 2017-09-04 LAB — MAGNESIUM: MAGNESIUM: 1.8 mg/dL (ref 1.7–2.4)

## 2017-09-04 LAB — TRIGLYCERIDES: Triglycerides: 113 mg/dL (ref <150)

## 2017-09-04 LAB — COOXEMETRY PANEL
Carboxyhemoglobin: 1.4 % (ref 0.5–1.5)
Methemoglobin: 1.4 % (ref 0.0–1.5)
O2 Saturation: 62.7 %
TOTAL HEMOGLOBIN: 13.4 g/dL (ref 12.0–16.0)

## 2017-09-04 LAB — LACTIC ACID, PLASMA: Lactic Acid, Venous: 1.7 mmol/L (ref 0.5–1.9)

## 2017-09-04 SURGERY — VISCERAL ANGIOGRAM
Anesthesia: General | Site: Abdomen | Laterality: Right

## 2017-09-04 MED ORDER — 0.9 % SODIUM CHLORIDE (POUR BTL) OPTIME
TOPICAL | Status: DC | PRN
  Administered 2017-09-04: 5000 mL
  Administered 2017-09-04: 3000 mL

## 2017-09-04 MED ORDER — FENTANYL CITRATE (PF) 250 MCG/5ML IJ SOLN
INTRAMUSCULAR | Status: DC | PRN
  Administered 2017-09-04 (×2): 50 ug via INTRAVENOUS
  Administered 2017-09-04 (×3): 100 ug via INTRAVENOUS
  Administered 2017-09-04: 50 ug via INTRAVENOUS
  Administered 2017-09-04: 100 ug via INTRAVENOUS

## 2017-09-04 MED ORDER — FENTANYL CITRATE (PF) 250 MCG/5ML IJ SOLN
INTRAMUSCULAR | Status: AC
Start: 2017-09-04 — End: ?
  Filled 2017-09-04: qty 5

## 2017-09-04 MED ORDER — PIPERACILLIN-TAZOBACTAM 3.375 G IVPB 30 MIN: 3.3750 g | Freq: Once | INTRAVENOUS | Status: DC

## 2017-09-04 MED ORDER — ACETAMINOPHEN 325 MG PO TABS
650.0000 mg | ORAL_TABLET | Freq: Four times a day (QID) | ORAL | Status: DC | PRN
  Administered 2017-09-24 – 2017-09-26 (×2): 650 mg via ORAL
  Filled 2017-09-04 (×2): qty 2

## 2017-09-04 MED ORDER — METOPROLOL TARTRATE 5 MG/5ML IV SOLN: 2.0000 mg | INTRAVENOUS | Status: DC | PRN

## 2017-09-04 MED ORDER — MIDAZOLAM HCL 5 MG/5ML IJ SOLN
INTRAMUSCULAR | Status: DC | PRN
  Administered 2017-09-04: 2 mg via INTRAVENOUS

## 2017-09-04 MED ORDER — IPRATROPIUM-ALBUTEROL 0.5-2.5 (3) MG/3ML IN SOLN
3.0000 mL | Freq: Four times a day (QID) | RESPIRATORY_TRACT | Status: DC
  Administered 2017-09-04 – 2017-09-15 (×43): 3 mL via RESPIRATORY_TRACT
  Filled 2017-09-04 (×45): qty 3

## 2017-09-04 MED ORDER — ORAL CARE MOUTH RINSE
15.0000 mL | OROMUCOSAL | Status: DC
  Administered 2017-09-04 – 2017-10-05 (×295): 15 mL via OROMUCOSAL

## 2017-09-04 MED ORDER — DEXTROSE 5 % IV SOLN
0.0000 ug/min | INTRAVENOUS | Status: DC
  Administered 2017-09-04: 5 ug/min via INTRAVENOUS
  Filled 2017-09-04 (×3): qty 4

## 2017-09-04 MED ORDER — FENTANYL CITRATE (PF) 100 MCG/2ML IJ SOLN
50.0000 ug | INTRAMUSCULAR | Status: DC | PRN
  Administered 2017-09-04 – 2017-09-22 (×18): 50 ug via INTRAVENOUS
  Filled 2017-09-04 (×4): qty 2

## 2017-09-04 MED ORDER — ALUM & MAG HYDROXIDE-SIMETH 200-200-20 MG/5ML PO SUSP: 15.0000 mL | ORAL | Status: DC | PRN

## 2017-09-04 MED ORDER — SODIUM CHLORIDE 0.9 % IV SOLN
INTRAVENOUS | Status: DC
  Administered 2017-09-04: 16:00:00 via INTRAVENOUS

## 2017-09-04 MED ORDER — ROCURONIUM BROMIDE 50 MG/5ML IV SOLN
INTRAVENOUS | Status: AC
  Filled 2017-09-04: qty 2

## 2017-09-04 MED ORDER — MORPHINE SULFATE (PF) 4 MG/ML IV SOLN: 2.0000 mg | INTRAVENOUS | Status: DC | PRN

## 2017-09-04 MED ORDER — CHLORHEXIDINE GLUCONATE CLOTH 2 % EX PADS
6.0000 | MEDICATED_PAD | Freq: Every day | CUTANEOUS | Status: AC
  Administered 2017-09-05 – 2017-09-09 (×5): 6 via TOPICAL

## 2017-09-04 MED ORDER — DOCUSATE SODIUM 100 MG PO CAPS: 100.0000 mg | ORAL_CAPSULE | Freq: Every day | ORAL | Status: DC

## 2017-09-04 MED ORDER — KCL IN DEXTROSE-NACL 20-5-0.45 MEQ/L-%-% IV SOLN
INTRAVENOUS | Status: DC
  Administered 2017-09-04: 11:00:00 via INTRAVENOUS
  Filled 2017-09-04: qty 1000

## 2017-09-04 MED ORDER — PROPOFOL 500 MG/50ML IV EMUL
INTRAVENOUS | Status: DC | PRN
  Administered 2017-09-04: 50 ug/kg/min via INTRAVENOUS

## 2017-09-04 MED ORDER — SUCCINYLCHOLINE CHLORIDE 20 MG/ML IJ SOLN
INTRAMUSCULAR | Status: DC | PRN
  Administered 2017-09-04: 110 mg via INTRAVENOUS

## 2017-09-04 MED ORDER — SODIUM CHLORIDE 0.9% FLUSH
10.0000 mL | INTRAVENOUS | Status: DC | PRN
  Administered 2017-09-26: 30 mL
  Filled 2017-09-04: qty 40

## 2017-09-04 MED ORDER — NOREPINEPHRINE BITARTRATE 1 MG/ML IV SOLN
0.0000 ug/min | INTRAVENOUS | Status: DC
  Administered 2017-09-04 – 2017-09-06 (×6): 40 ug/min via INTRAVENOUS
  Administered 2017-09-06: 35 ug/min via INTRAVENOUS
  Administered 2017-09-07: 26 ug/min via INTRAVENOUS
  Administered 2017-09-07: 34 ug/min via INTRAVENOUS
  Administered 2017-09-07: 40 ug/min via INTRAVENOUS
  Filled 2017-09-04 (×12): qty 16

## 2017-09-04 MED ORDER — SODIUM CHLORIDE 0.9 % IV SOLN
INTRAVENOUS | Status: DC | PRN
  Administered 2017-09-04: 1000 mL

## 2017-09-04 MED ORDER — PANTOPRAZOLE SODIUM 40 MG IV SOLR: 40.0000 mg | Freq: Two times a day (BID) | INTRAVENOUS | Status: DC

## 2017-09-04 MED ORDER — SODIUM CHLORIDE 0.9 % IV SOLN
INTRAVENOUS | Status: AC
  Filled 2017-09-04: qty 1.2

## 2017-09-04 MED ORDER — POTASSIUM CHLORIDE CRYS ER 20 MEQ PO TBCR: 20.0000 meq | EXTENDED_RELEASE_TABLET | Freq: Every day | ORAL | Status: DC | PRN

## 2017-09-04 MED ORDER — BISACODYL 10 MG RE SUPP: 10.0000 mg | Freq: Every day | RECTAL | Status: DC | PRN

## 2017-09-04 MED ORDER — SODIUM CHLORIDE 0.9 % IV SOLN: 500.0000 mL | Freq: Once | INTRAVENOUS | Status: DC | PRN

## 2017-09-04 MED ORDER — LIDOCAINE HCL (CARDIAC) PF 100 MG/5ML IV SOSY
PREFILLED_SYRINGE | INTRAVENOUS | Status: DC | PRN
  Administered 2017-09-04: 80 mg via INTRATRACHEAL

## 2017-09-04 MED ORDER — SODIUM CHLORIDE 0.9 % IV SOLN
8.0000 mg/h | INTRAVENOUS | Status: DC
  Administered 2017-09-04 – 2017-09-06 (×4): 8 mg/h via INTRAVENOUS
  Filled 2017-09-04 (×10): qty 80

## 2017-09-04 MED ORDER — MIDAZOLAM HCL 2 MG/2ML IJ SOLN
INTRAMUSCULAR | Status: AC
  Filled 2017-09-04: qty 2

## 2017-09-04 MED ORDER — PIPERACILLIN-TAZOBACTAM 3.375 G IVPB
3.3750 g | Freq: Three times a day (TID) | INTRAVENOUS | Status: DC
  Administered 2017-09-04 – 2017-09-07 (×10): 3.375 g via INTRAVENOUS
  Filled 2017-09-04 (×11): qty 50

## 2017-09-04 MED ORDER — FENTANYL CITRATE (PF) 100 MCG/2ML IJ SOLN: 50.0000 ug | INTRAMUSCULAR | Status: DC | PRN

## 2017-09-04 MED ORDER — SODIUM CHLORIDE 0.9 % IV BOLUS
1000.0000 mL | Freq: Once | INTRAVENOUS | Status: AC
  Administered 2017-09-04: 1000 mL via INTRAVENOUS

## 2017-09-04 MED ORDER — INSULIN ASPART 100 UNIT/ML ~~LOC~~ SOLN
0.0000 [IU] | SUBCUTANEOUS | Status: DC
  Administered 2017-09-04 (×3): 2 [IU] via SUBCUTANEOUS
  Administered 2017-09-05: 3 [IU] via SUBCUTANEOUS
  Administered 2017-09-05: 2 [IU] via SUBCUTANEOUS
  Administered 2017-09-05: 3 [IU] via SUBCUTANEOUS
  Administered 2017-09-05 (×2): 2 [IU] via SUBCUTANEOUS
  Administered 2017-09-05: 3 [IU] via SUBCUTANEOUS
  Administered 2017-09-05: 2 [IU] via SUBCUTANEOUS
  Administered 2017-09-06: 3 [IU] via SUBCUTANEOUS
  Administered 2017-09-06: 2 [IU] via SUBCUTANEOUS
  Administered 2017-09-06 – 2017-09-07 (×3): 3 [IU] via SUBCUTANEOUS
  Administered 2017-09-07: 2 [IU] via SUBCUTANEOUS
  Administered 2017-09-07 (×4): 3 [IU] via SUBCUTANEOUS
  Administered 2017-09-08 – 2017-09-12 (×12): 2 [IU] via SUBCUTANEOUS
  Administered 2017-09-13: 3 [IU] via SUBCUTANEOUS
  Administered 2017-09-13 – 2017-10-08 (×51): 2 [IU] via SUBCUTANEOUS

## 2017-09-04 MED ORDER — SODIUM CHLORIDE 0.9 % IV SOLN
0.0000 ug/min | INTRAVENOUS | Status: DC
  Administered 2017-09-04: 100 ug/min via INTRAVENOUS
  Filled 2017-09-04: qty 4
  Filled 2017-09-04: qty 40

## 2017-09-04 MED ORDER — HYDRALAZINE HCL 20 MG/ML IJ SOLN: 5.0000 mg | INTRAMUSCULAR | Status: DC | PRN

## 2017-09-04 MED ORDER — ONDANSETRON HCL 4 MG PO TABS
4.0000 mg | ORAL_TABLET | Freq: Four times a day (QID) | ORAL | Status: DC | PRN
Start: 2017-09-04 — End: 2017-09-30

## 2017-09-04 MED ORDER — SODIUM CHLORIDE 0.9% FLUSH
10.0000 mL | Freq: Two times a day (BID) | INTRAVENOUS | Status: DC
  Administered 2017-09-04: 10 mL
  Administered 2017-09-04: 20 mL
  Administered 2017-09-06 – 2017-09-09 (×5): 10 mL
  Administered 2017-09-11: 20 mL
  Administered 2017-09-13 – 2017-09-25 (×11): 10 mL
  Administered 2017-09-26: 20 mL
  Administered 2017-09-26: 10 mL
  Administered 2017-09-28: 20 mL
  Administered 2017-09-28: 10 mL
  Administered 2017-09-29: 20 mL
  Administered 2017-09-29 – 2017-10-02 (×5): 10 mL
  Administered 2017-10-03: 20 mL
  Administered 2017-10-03 – 2017-10-08 (×7): 10 mL
  Administered 2017-10-08 (×2): 20 mL
  Administered 2017-10-09: 10 mL

## 2017-09-04 MED ORDER — CALCIUM CHLORIDE 10 % IV SOLN: INTRAVENOUS | Status: DC | PRN

## 2017-09-04 MED ORDER — SODIUM CHLORIDE 0.9 % IV SOLN: Freq: Once | INTRAVENOUS | Status: DC

## 2017-09-04 MED ORDER — SODIUM CHLORIDE 0.9 % IJ SOLN
INTRAVENOUS | Status: DC | PRN
  Administered 2017-09-04: 100 mL via INTRAMUSCULAR

## 2017-09-04 MED ORDER — SODIUM CHLORIDE 0.9 % IV SOLN
0.5000 mg/h | INTRAVENOUS | Status: DC
  Administered 2017-09-04: 0.5 mg/h via INTRAVENOUS
  Administered 2017-09-05 – 2017-09-06 (×2): 3 mg/h via INTRAVENOUS
  Administered 2017-09-07: 2 mg/h via INTRAVENOUS
  Filled 2017-09-04 (×4): qty 10

## 2017-09-04 MED ORDER — PIPERACILLIN-TAZOBACTAM 3.375 G IVPB 30 MIN
3.3750 g | INTRAVENOUS | Status: AC
  Administered 2017-09-04: 3.375 g via INTRAVENOUS
  Filled 2017-09-04: qty 50

## 2017-09-04 MED ORDER — LACTATED RINGERS IV SOLN
INTRAVENOUS | Status: DC | PRN
  Administered 2017-09-04 (×5): via INTRAVENOUS

## 2017-09-04 MED ORDER — LACTATED RINGERS IV SOLN
INTRAVENOUS | Status: DC | PRN
  Administered 2017-09-04: 03:00:00 via INTRAVENOUS

## 2017-09-04 MED ORDER — CEFAZOLIN SODIUM-DEXTROSE 2-4 GM/100ML-% IV SOLN
2.0000 g | Freq: Three times a day (TID) | INTRAVENOUS | Status: DC
  Filled 2017-09-04 (×2): qty 100

## 2017-09-04 MED ORDER — FENTANYL CITRATE (PF) 250 MCG/5ML IJ SOLN
INTRAMUSCULAR | Status: AC
  Filled 2017-09-04: qty 5

## 2017-09-04 MED ORDER — SODIUM CHLORIDE 0.9 % IV SOLN
80.0000 mg | Freq: Once | INTRAVENOUS | Status: AC
  Administered 2017-09-04: 80 mg via INTRAVENOUS
  Filled 2017-09-04: qty 80

## 2017-09-04 MED ORDER — ACETAMINOPHEN 650 MG RE SUPP
650.0000 mg | Freq: Four times a day (QID) | RECTAL | Status: DC | PRN
  Administered 2017-09-07: 650 mg via RECTAL
  Filled 2017-09-04: qty 1

## 2017-09-04 MED ORDER — ONDANSETRON HCL 4 MG/2ML IJ SOLN
4.0000 mg | Freq: Four times a day (QID) | INTRAMUSCULAR | Status: DC | PRN
  Administered 2017-09-13: 4 mg via INTRAVENOUS
  Filled 2017-09-04: qty 2

## 2017-09-04 MED ORDER — ALBUMIN HUMAN 5 % IV SOLN
INTRAVENOUS | Status: DC | PRN
  Administered 2017-09-04 (×5): via INTRAVENOUS

## 2017-09-04 MED ORDER — PROPOFOL 10 MG/ML IV BOLUS
INTRAVENOUS | Status: AC
  Filled 2017-09-04: qty 20

## 2017-09-04 MED ORDER — LIDOCAINE-EPINEPHRINE (PF) 1 %-1:200000 IJ SOLN
INTRAMUSCULAR | Status: DC | PRN
  Administered 2017-09-04: 30 mL

## 2017-09-04 MED ORDER — SODIUM CHLORIDE 0.9 % IV SOLN
INTRAVENOUS | Status: DC | PRN
  Administered 2017-09-04: 08:00:00 via INTRAVENOUS

## 2017-09-04 MED ORDER — MUPIROCIN 2 % EX OINT
1.0000 "application " | TOPICAL_OINTMENT | Freq: Two times a day (BID) | CUTANEOUS | Status: AC
  Administered 2017-09-04 – 2017-09-08 (×10): 1 via NASAL
  Filled 2017-09-04 (×3): qty 22

## 2017-09-04 MED ORDER — HYDROMORPHONE HCL 1 MG/ML IJ SOLN: 0.5000 mg | INTRAMUSCULAR | Status: DC | PRN

## 2017-09-04 MED ORDER — SODIUM CHLORIDE 0.9 % IV SOLN: INTRAVENOUS | Status: DC

## 2017-09-04 MED ORDER — ROCURONIUM BROMIDE 100 MG/10ML IV SOLN
INTRAVENOUS | Status: DC | PRN
  Administered 2017-09-04: 50 mg via INTRAVENOUS
  Administered 2017-09-04 (×5): 20 mg via INTRAVENOUS

## 2017-09-04 MED ORDER — ROCURONIUM BROMIDE 50 MG/5ML IV SOLN
INTRAVENOUS | Status: AC
  Filled 2017-09-04: qty 1

## 2017-09-04 MED ORDER — LIDOCAINE 2% (20 MG/ML) 5 ML SYRINGE
INTRAMUSCULAR | Status: AC
  Filled 2017-09-04: qty 5

## 2017-09-04 MED ORDER — MAGNESIUM SULFATE 2 GM/50ML IV SOLN: 2.0000 g | Freq: Every day | INTRAVENOUS | Status: DC | PRN

## 2017-09-04 MED ORDER — PROPOFOL 10 MG/ML IV BOLUS
INTRAVENOUS | Status: DC | PRN
  Administered 2017-09-04: 20 mg via INTRAVENOUS
  Administered 2017-09-04: 30 mg via INTRAVENOUS

## 2017-09-04 MED ORDER — ARTIFICIAL TEARS OPHTHALMIC OINT
TOPICAL_OINTMENT | OPHTHALMIC | Status: AC
  Filled 2017-09-04: qty 3.5

## 2017-09-04 MED ORDER — PIPERACILLIN-TAZOBACTAM 3.375 G IVPB 30 MIN
3.3750 g | Freq: Once | INTRAVENOUS | Status: AC
  Administered 2017-09-04: 3.375 g via INTRAVENOUS
  Filled 2017-09-04: qty 50

## 2017-09-04 MED ORDER — ACETAMINOPHEN 650 MG RE SUPP
650.0000 mg | Freq: Once | RECTAL | Status: AC
  Administered 2017-09-04: 650 mg via RECTAL
  Filled 2017-09-04: qty 1

## 2017-09-04 MED ORDER — ATROPINE SULFATE 1 MG/10ML IJ SOSY
PREFILLED_SYRINGE | INTRAMUSCULAR | Status: AC
  Filled 2017-09-04: qty 10

## 2017-09-04 MED ORDER — PROTAMINE SULFATE 10 MG/ML IV SOLN
INTRAVENOUS | Status: DC | PRN
  Administered 2017-09-04 (×5): 10 mg via INTRAVENOUS

## 2017-09-04 MED ORDER — PHENYLEPHRINE 40 MCG/ML (10ML) SYRINGE FOR IV PUSH (FOR BLOOD PRESSURE SUPPORT)
PREFILLED_SYRINGE | INTRAVENOUS | Status: DC | PRN
  Administered 2017-09-04 (×2): 120 ug via INTRAVENOUS
  Administered 2017-09-04 (×2): 80 ug via INTRAVENOUS

## 2017-09-04 MED ORDER — HEPARIN SODIUM (PORCINE) 1000 UNIT/ML IJ SOLN
INTRAMUSCULAR | Status: DC | PRN
  Administered 2017-09-04: 5000 [IU] via INTRAVENOUS
  Administered 2017-09-04: 10000 [IU] via INTRAVENOUS

## 2017-09-04 MED ORDER — LIDOCAINE-EPINEPHRINE (PF) 1 %-1:200000 IJ SOLN
INTRAMUSCULAR | Status: AC
  Filled 2017-09-04: qty 30

## 2017-09-04 MED ORDER — SODIUM CHLORIDE 0.9 % IV BOLUS
1000.0000 mL | Freq: Once | INTRAVENOUS | Status: AC
Start: 2017-09-04 — End: 2017-09-04
  Administered 2017-09-04: 1000 mL via INTRAVENOUS

## 2017-09-04 MED ORDER — SUCCINYLCHOLINE CHLORIDE 200 MG/10ML IV SOSY
PREFILLED_SYRINGE | INTRAVENOUS | Status: AC
  Filled 2017-09-04: qty 20

## 2017-09-04 MED ORDER — FENTANYL 2500MCG IN NS 250ML (10MCG/ML) PREMIX INFUSION
0.0000 ug/h | INTRAVENOUS | Status: DC
  Administered 2017-09-04: 100 ug/h via INTRAVENOUS
  Administered 2017-09-05: 300 ug/h via INTRAVENOUS
  Administered 2017-09-05: 250 ug/h via INTRAVENOUS
  Administered 2017-09-05: 300 ug/h via INTRAVENOUS
  Administered 2017-09-06: 150 ug/h via INTRAVENOUS
  Administered 2017-09-06 (×2): 300 ug/h via INTRAVENOUS
  Administered 2017-09-07: 100 ug/h via INTRAVENOUS
  Administered 2017-09-08: 200 ug/h via INTRAVENOUS
  Administered 2017-09-09 (×2): 225 ug/h via INTRAVENOUS
  Administered 2017-09-09 – 2017-09-10 (×2): 250 ug/h via INTRAVENOUS
  Administered 2017-09-10: 400 ug/h via INTRAVENOUS
  Administered 2017-09-10: 300 ug/h via INTRAVENOUS
  Administered 2017-09-11 – 2017-09-12 (×7): 400 ug/h via INTRAVENOUS
  Administered 2017-09-12: 200 ug/h via INTRAVENOUS
  Administered 2017-09-12: 400 ug/h via INTRAVENOUS
  Administered 2017-09-13 (×2): 200 ug/h via INTRAVENOUS
  Administered 2017-09-14 (×3): 300 ug/h via INTRAVENOUS
  Administered 2017-09-15 (×3): 400 ug/h via INTRAVENOUS
  Administered 2017-09-15: 250 ug/h via INTRAVENOUS
  Administered 2017-09-16 – 2017-09-21 (×20): 400 ug/h via INTRAVENOUS
  Administered 2017-09-21: 50 ug/h via INTRAVENOUS
  Administered 2017-09-21 – 2017-09-24 (×10): 400 ug/h via INTRAVENOUS
  Administered 2017-09-25 – 2017-09-26 (×2): 100 ug/h via INTRAVENOUS
  Filled 2017-09-04 (×66): qty 250

## 2017-09-04 MED ORDER — CALCIUM CHLORIDE 10 % IV SOLN
INTRAVENOUS | Status: DC | PRN
  Administered 2017-09-04: 300 mg via INTRAVENOUS
  Administered 2017-09-04 (×2): 200 mg via INTRAVENOUS
  Administered 2017-09-04: 300 mg via INTRAVENOUS

## 2017-09-04 MED ORDER — LABETALOL HCL 5 MG/ML IV SOLN: 10.0000 mg | INTRAVENOUS | Status: DC | PRN

## 2017-09-04 MED ORDER — PHENYLEPHRINE HCL 10 MG/ML IJ SOLN
INTRAVENOUS | Status: DC | PRN
  Administered 2017-09-04: 50 ug/min via INTRAVENOUS
  Administered 2017-09-04: 25 ug/min via INTRAVENOUS
  Administered 2017-09-04: 50 ug/min via INTRAVENOUS

## 2017-09-04 MED ORDER — PROPOFOL 1000 MG/100ML IV EMUL
0.0000 ug/kg/min | INTRAVENOUS | Status: DC
  Administered 2017-09-04: 35 ug/kg/min via INTRAVENOUS
  Filled 2017-09-04: qty 100

## 2017-09-04 MED ORDER — GUAIFENESIN-DM 100-10 MG/5ML PO SYRP: 15.0000 mL | ORAL_SOLUTION | ORAL | Status: DC | PRN

## 2017-09-04 MED ORDER — PHENOL 1.4 % MT LIQD: 1.0000 | OROMUCOSAL | Status: DC | PRN

## 2017-09-04 MED ORDER — CHLORHEXIDINE GLUCONATE 0.12% ORAL RINSE (MEDLINE KIT)
15.0000 mL | Freq: Two times a day (BID) | OROMUCOSAL | Status: DC
  Administered 2017-09-04 – 2017-10-04 (×60): 15 mL via OROMUCOSAL

## 2017-09-04 SURGICAL SUPPLY — 83 items
BAG BANDED W/RUBBER/TAPE 36X54 (MISCELLANEOUS) ×5 IMPLANT
BENZOIN TINCTURE PRP APPL 2/3 (GAUZE/BANDAGES/DRESSINGS) ×20 IMPLANT
CANISTER SUCT 3000ML PPV (MISCELLANEOUS) ×5 IMPLANT
CANISTER WOUNDNEG PRESSURE 500 (CANNISTER) ×5 IMPLANT
CANNULA VESSEL 3MM 2 BLNT TIP (CANNULA) ×5 IMPLANT
CATH ACCU-VU SIZ PIG 5F 70CM (CATHETERS) ×5 IMPLANT
CATH ANGIO 5F BER2 65CM (CATHETERS) ×5 IMPLANT
CATH COUDE FOLEY 5CC 14FR (CATHETERS) ×10 IMPLANT
CHLORAPREP W/TINT 26ML (MISCELLANEOUS) ×5 IMPLANT
CLIP VESOCCLUDE MED 24/CT (CLIP) ×5 IMPLANT
COVER BACK TABLE 80X110 HD (DRAPES) ×5 IMPLANT
COVER DOME SNAP 22 D (MISCELLANEOUS) ×5 IMPLANT
COVER PROBE W GEL 5X96 (DRAPES) ×5 IMPLANT
COVER SURGICAL LIGHT HANDLE (MISCELLANEOUS) ×5 IMPLANT
DERMABOND ADHESIVE PROPEN (GAUZE/BANDAGES/DRESSINGS) ×1
DERMABOND ADVANCED .7 DNX6 (GAUZE/BANDAGES/DRESSINGS) ×4 IMPLANT
DEVICE TORQUE KENDALL .025-038 (MISCELLANEOUS) ×5 IMPLANT
DRAPE FEMORAL ANGIO 80X135IN (DRAPES) ×5 IMPLANT
DRSG TEGADERM 4X4.75 (GAUZE/BANDAGES/DRESSINGS) ×5 IMPLANT
ELECT REM PT RETURN 9FT ADLT (ELECTROSURGICAL) ×5
ELECTRODE REM PT RTRN 9FT ADLT (ELECTROSURGICAL) ×4 IMPLANT
FELT TEFLON 1X6 (MISCELLANEOUS) ×5 IMPLANT
GAUZE SPONGE 4X4 16PLY XRAY LF (GAUZE/BANDAGES/DRESSINGS) ×5 IMPLANT
GLOVE BIO SURGEON STRL SZ 6.5 (GLOVE) ×5 IMPLANT
GLOVE BIO SURGEON STRL SZ7.5 (GLOVE) ×30 IMPLANT
GLOVE BIOGEL M 7.0 STRL (GLOVE) ×5 IMPLANT
GLOVE BIOGEL M STRL SZ7.5 (GLOVE) ×15 IMPLANT
GLOVE BIOGEL PI IND STRL 6.5 (GLOVE) ×16 IMPLANT
GLOVE BIOGEL PI IND STRL 7.5 (GLOVE) ×4 IMPLANT
GLOVE BIOGEL PI IND STRL 8 (GLOVE) ×4 IMPLANT
GLOVE BIOGEL PI INDICATOR 6.5 (GLOVE) ×4
GLOVE BIOGEL PI INDICATOR 7.5 (GLOVE) ×1
GLOVE BIOGEL PI INDICATOR 8 (GLOVE) ×1
GLOVE ECLIPSE 7.0 STRL STRAW (GLOVE) ×5 IMPLANT
GLOVE ECLIPSE 7.5 STRL STRAW (GLOVE) ×5 IMPLANT
GLOVE SURG SS PI 7.5 STRL IVOR (GLOVE) ×10 IMPLANT
GOWN STRL REUS W/ TWL LRG LVL3 (GOWN DISPOSABLE) ×36 IMPLANT
GOWN STRL REUS W/ TWL XL LVL3 (GOWN DISPOSABLE) ×4 IMPLANT
GOWN STRL REUS W/TWL 2XL LVL3 (GOWN DISPOSABLE) ×5 IMPLANT
GOWN STRL REUS W/TWL LRG LVL3 (GOWN DISPOSABLE) ×9
GOWN STRL REUS W/TWL XL LVL3 (GOWN DISPOSABLE) ×1
GRAFT CV STRG TUBE 60X6KNIT (Vascular Products) ×4 IMPLANT
GRAFT HEMASHIELD 6MM (Vascular Products) ×1 IMPLANT
GRAFT HEMASHIELD 8MM (Vascular Products) ×1 IMPLANT
GRAFT VASC STRG 30X8KNIT (Vascular Products) ×4 IMPLANT
GUIDEWIRE ANGLED .035X150CM (WIRE) ×5 IMPLANT
KIT BASIN OR (CUSTOM PROCEDURE TRAY) ×5 IMPLANT
KIT TURNOVER KIT B (KITS) ×5 IMPLANT
LIGASURE IMPACT 36 18CM CVD LR (INSTRUMENTS) ×5 IMPLANT
NEEDLE HYPO 25GX1X1/2 BEV (NEEDLE) ×5 IMPLANT
NEEDLE PERC 18GX7CM (NEEDLE) ×5 IMPLANT
NS IRRIG 1000ML POUR BTL (IV SOLUTION) ×5 IMPLANT
PACK SURGICAL SETUP 50X90 (CUSTOM PROCEDURE TRAY) ×5 IMPLANT
PROTECTION STATION PRESSURIZED (MISCELLANEOUS) ×5
PUNCH AORTIC ROT 5.0MM RCL 50 (MISCELLANEOUS) ×5 IMPLANT
RELOAD PROXIMATE 75MM BLUE (ENDOMECHANICALS) ×10 IMPLANT
SET MICROPUNCTURE 5F STIFF (MISCELLANEOUS) ×5 IMPLANT
SHEATH AVANTI 11CM 5FR (SHEATH) ×5 IMPLANT
SHEATH PINNACLE 6F 10CM (SHEATH) ×5 IMPLANT
SPONGE ABDOMINAL VAC ABTHERA (MISCELLANEOUS) ×5 IMPLANT
SPONGE LAP 18X18 X RAY DECT (DISPOSABLE) ×5 IMPLANT
STAPLER PROXIMATE 75MM BLUE (STAPLE) ×5 IMPLANT
STAPLER VISISTAT 35W (STAPLE) ×5 IMPLANT
STATION PROTECTION PRESSURIZED (MISCELLANEOUS) ×4 IMPLANT
STOPCOCK MORSE 400PSI 3WAY (MISCELLANEOUS) ×5 IMPLANT
SUT PROLENE 3 0 SH DA (SUTURE) ×5 IMPLANT
SUT PROLENE 3 0 SH1 36 (SUTURE) ×20 IMPLANT
SUT PROLENE 5 0 C 1 24 (SUTURE) ×5 IMPLANT
SUT PROLENE 5 0 C 1 36 (SUTURE) ×40 IMPLANT
SUT PROLENE 5 0 CC1 (SUTURE) ×5 IMPLANT
SUT PROLENE 6 0 C 1 30 (SUTURE) ×20 IMPLANT
SUT PROLENE 6 0 CC (SUTURE) ×15 IMPLANT
SUT SILK 2 0 PERMA HAND 18 BK (SUTURE) ×5 IMPLANT
SUT VIC AB 3-0 SH 27 (SUTURE) ×3
SUT VIC AB 3-0 SH 27X BRD (SUTURE) ×12 IMPLANT
SUT VIC AB 4-0 PS2 18 (SUTURE) ×5 IMPLANT
SYR 10ML LL (SYRINGE) ×20 IMPLANT
SYR 20CC LL (SYRINGE) ×5 IMPLANT
SYR CONTROL 10ML LL (SYRINGE) ×5 IMPLANT
SYR MEDRAD MARK V 150ML (SYRINGE) ×5 IMPLANT
TOWEL GREEN STERILE (TOWEL DISPOSABLE) ×10 IMPLANT
TUBING HIGH PRESSURE 120CM (CONNECTOR) ×5 IMPLANT
WIRE BENTSON .035X145CM (WIRE) ×5 IMPLANT

## 2017-09-04 NOTE — Anesthesia Procedure Notes (Signed)
Procedure Name: Intubation Date/Time: 09/04/2017 3:27 AM Performed by: Valetta Fuller, CRNA Pre-anesthesia Checklist: Patient identified, Emergency Drugs available, Suction available and Patient being monitored Patient Re-evaluated:Patient Re-evaluated prior to induction Oxygen Delivery Method: Circle system utilized Preoxygenation: Pre-oxygenation with 100% oxygen Induction Type: IV induction and Rapid sequence Laryngoscope Size: [name] and 2 Grade View: Grade II Tube type: Subglottic suction tube Tube size: 7.5 mm Number of attempts: 1 Airway Equipment and Method: Stylet Placement Confirmation: ETT inserted through vocal cords under direct vision,  positive ETCO2 and breath sounds checked- equal and bilateral Secured at: 23 cm Tube secured with: Tape Dental Injury: Teeth and Oropharynx as per pre-operative assessment

## 2017-09-04 NOTE — Anesthesia Postprocedure Evaluation (Signed)
Anesthesia Post Note  Patient: Luke Kaufman  Procedure(s) Performed: Non Selective MESENTERIC ANGIOGRAM, (N/A Abdomen) EXPLORATORY LAPAROTOMY Right Colon Resection (Right Abdomen) URETHRA DILATATION and insertion of foley catheter (N/A ) APPLICATION OF WOUND VAC and Exploration of Abdomen. (N/A Abdomen) Aorta to Superior Mesinteric aorta bypass ultrason Left common Femoral/ (Abdomen)     Patient location during evaluation: SICU Anesthesia Type: General Level of consciousness: sedated Pain management: pain level controlled Vital Signs Assessment: post-procedure vital signs reviewed and stable Respiratory status: patient remains intubated per anesthesia plan Cardiovascular status: stable Postop Assessment: no apparent nausea or vomiting Anesthetic complications: no    Last Vitals:  Vitals:   09/04/17 1045 09/04/17 1100  BP:    Pulse: (!) 57 61  Resp: 18 18  Temp:    SpO2: 91% 95%    Last Pain:  Vitals:   09/04/17 0154  TempSrc: Oral  PainSc:                  [name],W. EDMOND

## 2017-09-04 NOTE — Op Note (Signed)
PRE-OPERATIVE DIAGNOSIS: Mesenteric ischemia  POST-OPERATIVE DIAGNOSIS:  Same  PROCEDURE:  Procedure(s): Exploratory laparotomy, right hemicolectomy  SURGEON:  Surgeon(s): Stark Klein, MD  ASSIST: Ruta Hinds, MD  ANESTHESIA:   general  DRAINS: Vac  LOCAL MEDICATIONS USED:  NONE  SPECIMEN:  Source of Specimen:  right colon  DISPOSITION OF SPECIMEN:  PATHOLOGY  COUNTS:  YES  DICTATION: .Dragon Dictation  PLAN OF CARE: Admit to inpatient   PATIENT DISPOSITION:  ICU - intubated and critically ill.  FINDINGS:  Arteriogram by Dr. 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 Oneida Alar.  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  An exploratory laparotomy was requested to assess whether or not there was salvageable small bowel and colon.  Emergency consent was assumed and a timeout was performed.  An upper midline incision was made with a #10 blade.  The subcutaneous tissues were divided with the cautery and the fascia was entered in the midline.  The peritoneum was entered.  There was no foul smell in the abdomen.  There was no immediately apparent necrotic bowel.    The incision was extended as Dr. Oneida Alar planned to do an arterial bypass.  The bowel and colon were more carefully evaluated.  Along the right paracolic gutter near the hepatic flexure was an area of purulent drainage.  The cecum in this region was seen to have patchy ischemia.  It was extremely dilated.  The right colon was taken down.  The terminal ileum was divided with the GIA 75 mm stapler.  The LigaSure was used to take down the mesocolon.  Transverse colon was divided approximately 10 cm distal to the hepatic flexure with another fire of the stapler.  The  terminal ileum was marked using 3-0 Prolene suture x2 with long tails.  The gallbladder was visualized and had no evidence of ischemia.  It was not distended.  There was no evidence of ischemia to the stomach.  The transverse colon, left colon, splenic flexure, and sigmoid colon were seen and were slightly pale, but there was no evidence of ischemia.  The right upper quadrant was reexamined and there was no evidence of bleeding.  The patient was left open for Dr. Oneida Alar to perform a mesenteric bypass.

## 2017-09-04 NOTE — Anesthesia Preprocedure Evaluation (Addendum)
Anesthesia Evaluation  Patient identified by MRN, date of birth, ID band Patient unresponsive    Reviewed: Unable to perform ROS - Chart review onlyPreop documentation limited or incomplete due to emergent nature of procedure.  Airway Mallampati: III  TM Distance: >3 FB Neck ROM: Full    Dental  (+) Missing, Poor Dentition, Chipped   Pulmonary Current Smoker,    breath sounds clear to auscultation       Cardiovascular hypertension, Pt. on medications + CAD, + Past MI and + Peripheral Vascular Disease   Rhythm:Regular Rate:Tachycardia     Neuro/Psych PSYCHIATRIC DISORDERS Depression CVA    GI/Hepatic PUD,   Endo/Other  Morbid obesity  Renal/GU Renal InsufficiencyRenal disease     Musculoskeletal   Abdominal   Peds  Hematology   Anesthesia Other Findings   Reproductive/Obstetrics                            Anesthesia Physical Anesthesia Plan  ASA: IV and emergent  Anesthesia Plan: General   Post-op Pain Management:    Induction: Intravenous, Rapid sequence and Cricoid pressure planned  PONV Risk Score and Plan: 1 and Treatment may vary due to age or medical condition  Airway Management Planned: Oral ETT  Additional Equipment: Arterial line and CVP  Intra-op Plan:   Post-operative Plan: Post-operative intubation/ventilation  Informed Consent:   History available from chart only and Only emergency history available  Plan Discussed with: CRNA and Surgeon  Anesthesia Plan Comments:        Anesthesia Quick Evaluation

## 2017-09-04 NOTE — Transfer of Care (Signed)
Immediate Anesthesia Transfer of Care Note  Patient: Luke Kaufman  Procedure(s) Performed: Non Selective MESENTERIC ANGIOGRAM, (N/A Abdomen) EXPLORATORY LAPAROTOMY Right Colon Resection (Right Abdomen) URETHRA DILATATION and insertion of foley catheter (N/A ) APPLICATION OF WOUND VAC and Exploration of Abdomen. (N/A Abdomen) Aorta to Superior Mesinteric aorta bypass ultrason Left common Femoral/ (Abdomen)  Patient Location: ICU  Anesthesia Type:General  Level of Consciousness: sedated, unresponsive and Patient remains intubated per anesthesia plan  Airway & Oxygen Therapy: Patient remains intubated per anesthesia plan and Patient placed on Ventilator (see vital sign flow sheet for setting)  Post-op Assessment: Report given to RN and Post -op Vital signs reviewed and stable  Post vital signs: Reviewed and stable  Last Vitals:  Vitals Value Taken Time  BP 96/48 09/04/2017 10:23 AM  Temp    Pulse    Resp 18 09/04/2017 10:37 AM  SpO2    Vitals shown include unvalidated device data.  Last Pain:  Vitals:   09/04/17 0154  TempSrc: Oral  PainSc:          Complications: No apparent anesthesia complications

## 2017-09-04 NOTE — Anesthesia Procedure Notes (Signed)
Arterial Line Insertion Start/End5/16/2019 3:44 AM, 09/04/2017 3:47 AM Performed by: Oleta Mouse, MD  Patient location: OR. Preanesthetic checklist: patient identified, IV checked, site marked, risks and benefits discussed, surgical consent, monitors and equipment checked, pre-op evaluation, timeout performed and anesthesia consent Right, radial was placed Catheter size: 20 G Hand hygiene performed  and maximum sterile barriers used   Attempts: 1 Procedure performed without using ultrasound guided technique. Following insertion, dressing applied. Post procedure assessment: normal and unchanged  Patient tolerated the procedure well with no immediate complications.

## 2017-09-04 NOTE — Consult Note (Signed)
Referring Physician: [place] Dr Alcario Drought  Patient name: Oral Remache MRN: [id number] DOB: 07-May-1950 Sex: male  REASON FOR CONSULT: abdominal pain rule out mesenteric ischemia  HPI: Luke Kaufman is a 67 y.o. male with abdominal pain since yesterday afternoon.  He describes it as constant and periumbilical.  No nausea or vomiting.  No fever or chills.  No prior abdominal operations.  He is a 2 PPD smoker.  He has had a prior stroke and has residual double vision. Other medical problems include hyperlipidemia, hypertension, history gastric ulcer.   Past Medical History:  Diagnosis Date  . Gastric ulcer   . Hyperlipemia   . Hypertension   . MI (myocardial infarction) ([place])   . Stroke ([place])   . Tobacco abuse    Past Surgical History:  Procedure Laterality Date  . ESOPHAGOGASTRODUODENOSCOPY N/A 08/09/2014   Procedure: ESOPHAGOGASTRODUODENOSCOPY (EGD);  Surgeon: Jerene Bears, MD;  Location: [facility];  Service: Gastroenterology;  Laterality: N/A;  . ESOPHAGOGASTRODUODENOSCOPY N/A 08/10/2014   Procedure: ESOPHAGOGASTRODUODENOSCOPY (EGD);  Surgeon: Jerene Bears, MD;  Location: [facility];  Service: Gastroenterology;  Laterality: N/A;    Family History  Problem Relation Age of Onset  . Pancreatic cancer Mother   . Heart attack Father 47    SOCIAL HISTORY: Social History   Socioeconomic History  . Marital status: Single    Spouse name: Not on file  . Number of children: 1  . Years of education: Not on file  . Highest education level: Not on file  Occupational History  . Not on file  Social Needs  . Financial resource strain: Not on file  . Food insecurity:    Worry: Not on file    Inability: Not on file  . Transportation needs:    Medical: Not on file    Non-medical: Not on file  Tobacco Use  . Smoking status: Current Every Day Smoker    Packs/day: 1.50    Types: Cigarettes  . Smokeless tobacco: Never Used  . Tobacco comment: He was taking Chantix but had  nightmares.  Substance and Sexual Activity  . Alcohol use: Yes    Alcohol/week: 0.0 oz    Comment: occasional  . Drug use: No  . Sexual activity: Not on file  Lifestyle  . Physical activity:    Days per week: Not on file    Minutes per session: Not on file  . Stress: Not on file  Relationships  . Social connections:    Talks on phone: Not on file    Gets together: Not on file    Attends religious service: Not on file    Active member of club or organization: Not on file    Attends meetings of clubs or organizations: Not on file    Relationship status: Not on file  . Intimate partner violence:    Fear of current or ex partner: Not on file    Emotionally abused: Not on file    Physically abused: Not on file    Forced sexual activity: Not on file  Other Topics Concern  . Not on file  Social History Narrative   Retired from [employer] se[employer].      No Known Allergies  Current Facility-Administered Medications  Medication Dose Route Frequency Provider Last Rate Last Dose  . pantoprazole (PROTONIX) 80 mg in sodium chloride 0.9 % 100 mL IVPB  80 mg Intravenous Once Alcario Drought, Jared M, DO      . pantoprazole (PROTONIX) 80  mg in sodium chloride 0.9 % 250 mL (0.32 mg/mL) infusion  8 mg/hr Intravenous Continuous Etta Quill, DO      . [START ON 09/07/2017] pantoprazole (PROTONIX) injection 40 mg  40 mg Intravenous Q12H Alcario Drought, Jared M, DO      . piperacillin-tazobactam (ZOSYN) IVPB 3.375 g  3.375 g Intravenous Once Horton, Barbette Hair, MD      . sodium chloride 0.9 % bolus 1,000 mL  1,000 mL Intravenous Once Merryl Hacker, MD 2,000 mL/hr at 09/04/17 0042 1,000 mL at 09/04/17 0042   Current Outpatient Medications  Medication Sig Dispense Refill  . amLODipine (NORVASC) 10 MG tablet Take 1 tablet (10 mg total) by mouth daily. 30 tablet 0  . aspirin EC 81 MG tablet Take 81 mg by mouth every 6 (six) hours as needed for moderate pain.    [name] atorvastatin (LIPITOR) 40 MG  tablet Take 40 mg by mouth daily.  1  . gemfibrozil (LOPID) 600 MG tablet Take 600 mg by mouth 2 (two) times daily before a meal.   1  . metoprolol tartrate (LOPRESSOR) 100 MG tablet Take 100 mg by mouth 2 (two) times daily.  1  . triamterene-hydrochlorothiazide (MAXZIDE-25) 37.5-25 MG tablet TAKE 1 TABLET BY MOUTH DAILY      ROS:   Unable to obtain.  Pt just received morphine and is very difficult to obtain history  Physical Examination  Vitals:   09/03/17 2355 09/04/17 0017 09/04/17 0020 09/04/17 0026  BP: 132/85 90/73 90/73  (!) 76/49  Pulse: (!) 104 (!) 110  (!) 107  Resp: (!) 24   (!) 25  Temp:    (!) 100.7 F (38.2 C)  TempSrc:    Axillary  SpO2: 97% 94%  95%    There is no height or weight on file to calculate BMI.  General:  Awake but disoriented HEENT: Normal Neck: No JVD Pulmonary: Clear to auscultation bilaterally Cardiac: Regular Rate and Rhythm tachycardia without murmur Abdomen: obese, distended, no mass, periumbilical pain, more right upper quadrant pain Skin: no ulcer or rash Extremity Pulses:  2+ radial, brachial, femoral,absent dorsalis pedis, posterior tibial pulses bilaterally Musculoskeletal: No deformity or edema  Neurologic: Upper and lower extremity motor 5/5 and symmetric  DATA:  CT abdomen pelvis.  Not designated arterial phase. Mesenterics appear patent but some calcification of the aorta and near the origins.  Portal venous gas confined to left side of liver  Lactate 3  CBC    Component Value Date/Time   WBC 11.4 (H) 09/03/2017 1644   RBC 5.46 09/03/2017 1644   HGB 16.7 09/03/2017 1644   HCT 48.7 09/03/2017 1644   PLT 263 09/03/2017 1644   MCV 89.2 09/03/2017 1644   MCH 30.6 09/03/2017 1644   MCHC 34.3 09/03/2017 1644   RDW 13.5 09/03/2017 1644   LYMPHSABS 1.0 04/22/2015 2232   MONOABS 0.6 04/22/2015 2232   EOSABS 0.1 04/22/2015 2232   BASOSABS 0.0 04/22/2015 2232    BMET    Component Value Date/Time   NA 138 09/03/2017 1644    K 4.0 09/03/2017 1644   CL 101 09/03/2017 1644   CO2 22 09/03/2017 1644   GLUCOSE 172 (H) 09/03/2017 1644   BUN 16 09/03/2017 1644   CREATININE 1.44 (H) 09/03/2017 1644   CALCIUM 9.2 09/03/2017 1644   GFRNONAA 49 (L) 09/03/2017 1644   GFRAA 57 (L) 09/03/2017 1644     ASSESSMENT:  Abdominal pain with portal venous gas unknown etiology.  Mesenterics appear  patent but not dedicated scan   PLAN:  12 lead EKG Will do mesenteric angio to further define gut blood supply. General surgery to see.   Ruta Hinds, MD [facility] Office: [phone] Pager: [phone]

## 2017-09-04 NOTE — Consult Note (Addendum)
PULMONARY / CRITICAL CARE MEDICINE   Name: Luke Kaufman MRN: [id number] DOB: Dec 16, 1950    ADMISSION DATE:  09/03/2017 CONSULTATION DATE:  5/16  REFERRING MD:  Oneida Alar   CHIEF COMPLAINT:  Post op Vent management   HISTORY OF PRESENT ILLNESS:   67 year old male active smoker (2 PPD) with history of hypertension, CAD, MI, stroke, PAD who presented 5/15 to the ED with complaints of severe epigastric and periumbilical pain.  ER work-up revealed lactate 3.5, fever.  CT abdomen was concerning for portal venous gas.  He was seen by vascular surgery with concern for mesenteric ischemia and underwent mesenteric angiography which showed ischemic right colon.  He then underwent exploratory lap by general surgery with right hemicolectomy.  Postop abd remains open with VAC, he remains on ventilator, transferred to ICU and PCCM consulted for ventilator and medical management.  PAST MEDICAL HISTORY :  He  has a past medical history of Gastric ulcer, Hyperlipemia, Hypertension, MI (myocardial infarction) ([place]), Stroke ([place]), and Tobacco abuse.  PAST SURGICAL HISTORY: He  has a past surgical history that includes Esophagogastroduodenoscopy (N/A, 08/09/2014) and Esophagogastroduodenoscopy (N/A, 08/10/2014).  No Known Allergies  No current facility-administered medications on file prior to encounter.    Current Outpatient Medications on File Prior to Encounter  Medication Sig  . amLODipine (NORVASC) 10 MG tablet Take 1 tablet (10 mg total) by mouth daily.  [name] aspirin EC 81 MG tablet Take 81 mg by mouth every 6 (six) hours as needed for moderate pain.  [name] atorvastatin (LIPITOR) 40 MG tablet Take 40 mg by mouth daily.  [name] gemfibrozil (LOPID) 600 MG tablet Take 600 mg by mouth 2 (two) times daily before a meal.   . metoprolol tartrate (LOPRESSOR) 100 MG tablet Take 100 mg by mouth 2 (two) times daily.  [name] triamterene-hydrochlorothiazide (MAXZIDE-25) 37.5-25 MG tablet TAKE 1 TABLET BY MOUTH DAILY    FAMILY  HISTORY:  His indicated that the status of his mother is unknown. He indicated that the status of his father is unknown.   SOCIAL HISTORY: He  reports that he has been smoking cigarettes.  He has been smoking about 1.50 packs per day. He has never used smokeless tobacco. He reports that he drinks alcohol. He reports that he does not use drugs.  REVIEW OF SYSTEMS:   Unable. As per HPI above obtained from records, RN, MD.   SUBJECTIVE:    VITAL SIGNS: BP (!) 164/96   Pulse 61   Temp (!) 101 F (38.3 C) (Oral)   Resp 18   SpO2 95%   HEMODYNAMICS:    VENTILATOR SETTINGS: Vent Mode: PRVC FiO2 (%):  [40 %] 40 % Set Rate:  [18 bmp] 18 bmp Vt Set:  [570 mL] 570 mL PEEP:  [5 cmH20] 5 cmH20  INTAKE / OUTPUT: I/O last 3 completed shifts: In: 7266.7 [I.V.:3300; IV Piggyback:3966.7] Out: 800 [Urine:500; Blood:300]  PHYSICAL EXAMINATION: General:  Chronically ill appearing male, NAD post op on vent  Neuro:  Sedated post op, RASS -3 HEENT:  Mm moist, ETT  Cardiovascular:  s1s2 rrr Lungs:  resps even non labored on vent, coarse throughout  Abdomen:  Open, large wound VAC in place Musculoskeletal:  Warm and dry, scant BLE edema   LABS:  BMET Recent Labs  Lab 09/03/17 1644  NA 138  K 4.0  CL 101  CO2 22  BUN 16  CREATININE 1.44*  GLUCOSE 172*    Electrolytes Recent Labs  Lab 09/03/17 1644  CALCIUM 9.2  CBC Recent Labs  Lab 09/03/17 1644  WBC 11.4*  HGB 16.7  HCT 48.7  PLT 263    Coag's No results for input(s): APTT, INR in the last 168 hours.  Sepsis Markers Recent Labs  Lab 09/03/17 2252 09/04/17 0126  LATICACIDVEN 3.47* 3.20*    ABG No results for input(s): PHART, PCO2ART, PO2ART in the last 168 hours.  Liver Enzymes Recent Labs  Lab 09/03/17 1644  AST 25  ALT 16*  ALKPHOS 104  BILITOT 0.6  ALBUMIN 4.0    Cardiac Enzymes No results for input(s): TROPONINI, PROBNP in the last 168 hours.  Glucose No results for input(s): GLUCAP  in the last 168 hours.  Imaging Ct Abdomen Pelvis W Contrast  Result Date: 09/03/2017 CLINICAL DATA:  Abdominal pain with nausea history of ulcer EXAM: CT ABDOMEN AND PELVIS WITH CONTRAST TECHNIQUE: Multidetector CT imaging of the abdomen and pelvis was performed using the standard protocol following bolus administration of intravenous contrast. CONTRAST:  100 cc Isovue-300 intravenous COMPARISON:  Renal ultrasound 05/19/2013 FINDINGS: Lower chest: Lung bases demonstrate patchy dependent atelectasis on the right. No pleural effusion. Heart size upper normal. Coronary vascular calcification. Small hiatal hernia. Hepatobiliary: Peripherally distributed gas in the caudate and left hepatic lobe consistent with portal venous gas. No calcified gallstones. No biliary dilatation Pancreas: Fatty atrophy with no inflammation Spleen: Normal in size without focal abnormality. Adrenals/Urinary Tract: Adrenal glands are within normal limits. Atrophic left kidney. No hydronephrosis. Bladder is normal Stomach/Bowel: Stomach is nonenlarged. No dilated small bowel. No colon wall thickening. Negative appendix. Sigmoid colon diverticular disease without acute inflammation Vascular/Lymphatic: Moderate severe aortic atherosclerosis. No aneurysm. No significantly enlarged lymph nodes. Reproductive: Enlarged prostate Other: Negative for free air or free fluid. Musculoskeletal: Degenerative changes of the spine. No acute or suspicious abnormality IMPRESSION: 1. Portal venous gas in the caudate and left hepatic lobe, uncertain source or etiology. No areas of abnormal bowel wall thickening or intramural air are visualized. Suggest correlation with lactic acid levels. 2. 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 Atrophic left kidney Electronically Signed   By: Donavan Foil M.D.   On: 09/03/2017 21:36   Dg Chest Port 1 View  Result Date: 09/04/2017 CLINICAL DATA:  Hypoxia EXAM: PORTABLE CHEST 1 VIEW COMPARISON:   November 23, 2015 FINDINGS: Endotracheal tube tip is at the carina. Nasogastric tube tip and side port are below the diaphragm. Central catheter tip is in the superior vena cava. No pneumothorax. There is atelectatic change in the right mid lung. There is a small left pleural effusion. Lungs elsewhere are clear. Heart is mildly enlarged with pulmonary vascularity normal. No adenopathy. There is aortic atherosclerosis. No bone lesions. IMPRESSION: Tube and catheter positions as described without pneumothorax. Note that the endotracheal tube tip is at the carina. Suggest withdrawing endotracheal tube approximately 3 cm. Right midlung atelectasis. Small left pleural effusion. Lungs elsewhere clear. Stable cardiac prominence. These results will be called to the ordering clinician or representative by the Radiologist Assistant, and communication documented in the PACS or zVision Dashboard. Electronically Signed   By: Lowella Grip III M.D.   On: 09/04/2017 10:51   Dg Abd Portable 1v  Result Date: 09/04/2017 CLINICAL DATA:  Status post colon resection and laparotomy. EXAM: PORTABLE ABDOMEN - 1 VIEW COMPARISON:  Radiographs of same day. FINDINGS: The bowel gas pattern is normal. Distal tip of nasogastric tube is seen in expected position of proximal stomach. No radio-opaque calculi or other significant radiographic abnormality are seen.  IMPRESSION: Distal tip of nasogastric tube is seen in expected position of proximal stomach. No evidence of bowel obstruction or ileus. Electronically Signed   By: Marijo Conception, M.D.   On: 09/04/2017 11:41   Dg Or Local Abdomen  Result Date: 09/04/2017 CLINICAL DATA:  Instrument count in the operating room EXAM: OR LOCAL ABDOMEN COMPARISON:  CT abdomen and pelvis of 09/03/2017 FINDINGS: Portable supine views of the abdomen show no opaque foreign body. Bowel gas is present which may indicate mild ileus. Single small amount of contrast is noted in the urinary bladder with Foley  catheter present. IMPRESSION: No opaque foreign body is seen. I called this report to Otila Kluver in the operating room at the time of interpretation. Electronically Signed   By: Ivar Drape M.D.   On: 09/04/2017 09:52     STUDIES:  CT abd/pelvis 5/15>>> 1. Portal venous gas in the caudate and left hepatic lobe, uncertain source or etiology. No areas of abnormal bowel wall thickening or intramural air are visualized. Suggest correlation with lactic acid levels. 2. 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 Atrophic left kidney  CULTURES: BC x 2 5/16>>>  ANTIBIOTICS: Zosyn 5/16>>>  SIGNIFICANT EVENTS: OR 5/16>>>  LINES/TUBES: ETT 5/16>>>ischemic R colon, exp lap, R hemicolectomy    DISCUSSION: 67 year old male with ischemic bowel, status post exploratory lap 5/16 with postop vent needs.  ASSESSMENT / PLAN:  PULMONARY Acute respiratory failure-postop exploratory laparotomy for ischemic bowel Tobacco abuse-2 PPD P:   Vent support - 8cc/kg  F/u CXR  F/u ABG No SBT/Wean for now  Smoking cessation PRN BD  CARDIOVASCULAR History of HTN History CAD, MI PAD Hypotension  P:  Telemetry monitoring Gentle volume  Trend CVP  Check lactate, pct  Trend troponin  RENAL Lactic acidosis  AKI - mild.  Baseline Scr ~ 1.2.  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 PPI Per surgery Antibiotics as above Planned back to OR in am 5/17 for closure per RN  HEMATOLOGIC No active issue  P:  F/u CBC now and in am  SCD's   INFECTIOUS Mesenteric ischemia  Open abd  P:   Zosyn per pharmacy  Trend pct   ENDOCRINE Hyperglycemia  P:   hgbA1c  SSI   NEUROLOGIC Sedation needs (ooen abd) P:   RASS goal: -2 Propofol, PRN fentanyl  No WUA prior to OR in am    FAMILY  - Updates: no family at bedside 5/16, updated  by surgery   - Inter-disciplinary family meet or Palliative Care meeting due by:  Day 7      Nickolas Madrid, NP 09/04/2017  11:49 AM Pager: [phone] or [phone]  Attending Note:  67 year old male with PMH of COPD who presents for colonic resection s/p SMA occlusion who presents to PCCM for vent management as patient is going back to the OR since abdomen is still open.  On exam, diffuse end exp wheezes with an open abdomen.  I reviewed CXR myself, ETT is too low and will be adjusted.  Discussed with surgery and PCCM-NP.  Will proceed with vent adjustment.  Check ABG and adjust vent to accommodate for breath stacking.  May need PVC.  Keep completely sedate given overall picture.  Propofol.  Add fentanyl drip.  F/U on cultures.  Abx as ordered.    The patient  is critically ill with multiple organ systems failure and requires high complexity decision making for assessment and support, frequent evaluation and titration of therapies, application of advanced monitoring technologies and extensive interpretation of multiple databases.   Critical Care Time devoted to patient care services described in this note is  34  Minutes. This time reflects time of care of this signee Dr Jennet Maduro. This critical care time does not reflect procedure time, or teaching time or supervisory time of PA/NP/Med student/Med Resident etc but could involve care discussion time.  Rush Farmer, M.D. [facility]. Pager: [phone]. After hours pager: [phone].

## 2017-09-04 NOTE — Op Note (Signed)
Preoperative diagnosis: Unable to insert Foley catheter Postoperative diagnosis: Unable to insert Foley cath Surgery: Urethral dilation and insertion of Foley catheter Surgeon: Dr. Nicki Reaper Asberry Lascola  The patient has the above diagnosis and I was called to assist by vascular and general surgery to insert a Foley catheter.  The patient was already under anesthetic.  He was undergoing a laparotomy.  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  The meatus was small but soft and supple.  I do not see evidence of a previous urethroplasty.  I gently dilated him from approximately 10 [place] to 84 [place] with male sounds well lubricated.  There is no bleeding or injury  A 14 [place] coud catheter was easily inserted into the bladder with clear urine return.  The patient will have the catheter removed as usual.

## 2017-09-04 NOTE — ED Provider Notes (Signed)
12:27 AM  She has not received in transfer from [facility].  Concerns for ischemic gut.  On exam he still complains of some abdominal pain.  He has mild diffuse tenderness.  No focal tenderness.  Dr. Oneida Alar was aware of patient's arrival.  He requested to make sure the general surgery also evaluates the patient.  General surgery, Dr. Barry Dienes, consulted.  She will evaluate the patient.  Of note, initial blood pressure 90 systolic upon arrival.  Prior blood pressures have been in the 130s.  Patient was given a liter of fluids.  He has not received antibiotics.  Given concern for ischemic gut, will cover with Zosyn.  Blood cultures already pending.  Also ordered.  1:30 AM Cussed with Dr. Barry Dienes.  Plan from surgical perspective is to get a CT angiogram to ensure vasculature is patent.  No surgery at this time.  Suspect peptic ulcer.  Recommend admission to the hospitalist.  Patient notably febrile here.  He was given Zosyn and blood cultures are pending.  2:07 AM Discussed with Dr. Blaine Hamper.  Admission done by Dr. Alcario Drought at [facility].  Will release admission orders.   Merryl Hacker, MD 09/04/17 [phone]

## 2017-09-04 NOTE — H&P (Signed)
History and Physical    Luke Kaufman VZD:[id number] DOB: Apr 29, 1950 DOA: 09/03/2017  PCP: Jefm Petty, MD  Patient coming from: Home  I have personally briefly reviewed patient's old medical records in [place]  Chief Complaint: Abd pain  HPI: Luke Kaufman is a 67 y.o. male with medical history significant of PUD, vascular disease including CAD, CVA, PAD; HTN, HLD.  Patient presents to the ED with c/o epigastric and peri-umbilical abd pain.  Severe, between 8 and 10 / 10 in intensity.  Dull ache quality.  Constant.  Onset around 1 PM, sudden.  Associated nausea, dry heaving.  Unsure if worse with PO intake.  No recent NSAID use.  No CP, no SOB, no changes in bowel movements, no urinary symptoms.  Last hospital admit was 2016 for UGIB due to peptic ulcers.  This feels much more severe than pain from PUD did he says.  ED Course: CT concerning for portal venous gas findings.  Lactate is elevated at 3.5.   Review of Systems: As per HPI otherwise 10 point review of systems negative.   Past Medical History:  Diagnosis Date  . Gastric ulcer   . Hyperlipemia   . Hypertension   . MI (myocardial infarction) ([place])   . Stroke ([place])   . Tobacco abuse     Past Surgical History:  Procedure Laterality Date  . ESOPHAGOGASTRODUODENOSCOPY N/A 08/09/2014   Procedure: ESOPHAGOGASTRODUODENOSCOPY (EGD);  Surgeon: Jerene Bears, MD;  Location: [facility];  Service: Gastroenterology;  Laterality: N/A;  . ESOPHAGOGASTRODUODENOSCOPY N/A 08/10/2014   Procedure: ESOPHAGOGASTRODUODENOSCOPY (EGD);  Surgeon: Jerene Bears, MD;  Location: [facility];  Service: Gastroenterology;  Laterality: N/A;     reports that he has been smoking cigarettes.  He has been smoking about 1.50 packs per day. He has never used smokeless tobacco. He reports that he drinks alcohol. He reports that he does not use drugs.  No Known Allergies  Family History  Problem Relation Age of Onset  . Pancreatic  cancer Mother   . Heart attack Father 32     Prior to Admission medications   Medication Sig Start Date End Date Taking? Authorizing Provider  amLODipine (NORVASC) 10 MG tablet Take 1 tablet (10 mg total) by mouth daily. 05/19/13  Yes Thurnell Lose, MD  aspirin EC 81 MG tablet Take 81 mg by mouth every 6 (six) hours as needed for moderate pain.   Yes [provider]  atorvastatin (LIPITOR) 40 MG tablet Take 40 mg by mouth daily. 06/23/17  Yes [provider]  gemfibrozil (LOPID) 600 MG tablet Take 600 mg by mouth 2 (two) times daily before a meal.  06/20/17  Yes [provider]  metoprolol tartrate (LOPRESSOR) 100 MG tablet Take 100 mg by mouth 2 (two) times daily. 07/04/17  Yes [provider]  triamterene-hydrochlorothiazide (MAXZIDE-25) 37.5-25 MG tablet TAKE 1 TABLET BY MOUTH DAILY 05/15/16  Yes [provider]    Physical Exam: Vitals:   09/03/17 2212 09/03/17 2215 09/03/17 2341 09/03/17 2355  BP: (!) 146/72 130/80 122/82 132/85  Pulse: (!) 104 (!) 113 (!) 104 (!) 104  Resp:   (!) 23 (!) 24  Temp:      TempSrc:      SpO2: 98% 97%  97%    Constitutional: NAD, calm, comfortable Eyes: PERRL, lids and conjunctivae normal ENMT: Mucous membranes are moist. Posterior pharynx clear of any exudate or lesions.Normal dentition.  Neck: normal, supple, no masses, no thyromegaly Respiratory: clear to  auscultation bilaterally, no wheezing, no crackles. Normal respiratory effort. No accessory muscle use.  Cardiovascular: Regular rate and rhythm, no murmurs / rubs / gallops. No extremity edema. 2+ pedal pulses. No carotid bruits.  Abdomen: TTP, but no guarding, no rebound. Musculoskeletal: no clubbing / cyanosis. No joint deformity upper and lower extremities. Good ROM, no contractures. Normal muscle tone.  Skin: no rashes, lesions, ulcers. No induration Neurologic: CN 2-12 grossly intact. Sensation intact, DTR normal. Strength 5/5 in all 4.    Psychiatric: Normal judgment and insight. Alert and oriented x 3. Normal mood.    Labs on Admission: I have personally reviewed following labs and imaging studies  CBC: Recent Labs  Lab 09/03/17 1644  WBC 11.4*  HGB 16.7  HCT 48.7  MCV 89.2  PLT 676   Basic Metabolic Panel: Recent Labs  Lab 09/03/17 1644  NA 138  K 4.0  CL 101  CO2 22  GLUCOSE 172*  BUN 16  CREATININE 1.44*  CALCIUM 9.2   GFR: CrCl cannot be calculated (Unknown ideal weight.). Liver Function Tests: Recent Labs  Lab 09/03/17 1644  AST 25  ALT 16*  ALKPHOS 104  BILITOT 0.6  PROT 8.4*  ALBUMIN 4.0   Recent Labs  Lab 09/03/17 1644  LIPASE 37   No results for input(s): AMMONIA in the last 168 hours. Coagulation Profile: No results for input(s): INR, PROTIME in the last 168 hours. Cardiac Enzymes: No results for input(s): CKTOTAL, CKMB, CKMBINDEX, TROPONINI in the last 168 hours. BNP (last 3 results) No results for input(s): PROBNP in the last 8760 hours. HbA1C: No results for input(s): HGBA1C in the last 72 hours. CBG: No results for input(s): GLUCAP in the last 168 hours. Lipid Profile: No results for input(s): CHOL, HDL, LDLCALC, TRIG, CHOLHDL, LDLDIRECT in the last 72 hours. Thyroid Function Tests: No results for input(s): TSH, T4TOTAL, FREET4, T3FREE, THYROIDAB in the last 72 hours. Anemia Panel: No results for input(s): VITAMINB12, FOLATE, FERRITIN, TIBC, IRON, RETICCTPCT in the last 72 hours. Urine analysis:    Component Value Date/Time   COLORURINE YELLOW 09/03/2017 2211   APPEARANCEUR CLEAR 09/03/2017 2211   LABSPEC 1.032 (H) 09/03/2017 2211   PHURINE 5.0 09/03/2017 2211   GLUCOSEU NEGATIVE 09/03/2017 2211   HGBUR NEGATIVE 09/03/2017 2211   BILIRUBINUR NEGATIVE 09/03/2017 2211   KETONESUR NEGATIVE 09/03/2017 2211   PROTEINUR NEGATIVE 09/03/2017 2211   UROBILINOGEN 0.2 05/17/2013 0307   NITRITE NEGATIVE 09/03/2017 2211   LEUKOCYTESUR NEGATIVE 09/03/2017 2211     Radiological Exams on Admission: Ct Abdomen Pelvis W Contrast  Result Date: 09/03/2017 CLINICAL DATA:  Abdominal pain with nausea history of ulcer EXAM: CT ABDOMEN AND PELVIS WITH CONTRAST TECHNIQUE: Multidetector CT imaging of the abdomen and pelvis was performed using the standard protocol following bolus administration of intravenous contrast. CONTRAST:  100 cc Isovue-300 intravenous COMPARISON:  Renal ultrasound 05/19/2013 FINDINGS: Lower chest: Lung bases demonstrate patchy dependent atelectasis on the right. No pleural effusion. Heart size upper normal. Coronary vascular calcification. Small hiatal hernia. Hepatobiliary: Peripherally distributed gas in the caudate and left hepatic lobe consistent with portal venous gas. No calcified gallstones. No biliary dilatation Pancreas: Fatty atrophy with no inflammation Spleen: Normal in size without focal abnormality. Adrenals/Urinary Tract: Adrenal glands are within normal limits. Atrophic left kidney. No hydronephrosis. Bladder is normal Stomach/Bowel: Stomach is nonenlarged. No dilated small bowel. No colon wall thickening. Negative appendix. Sigmoid colon diverticular disease without acute inflammation Vascular/Lymphatic: Moderate severe aortic atherosclerosis. No aneurysm. No significantly enlarged  lymph nodes. Reproductive: Enlarged prostate Other: Negative for free air or free fluid. Musculoskeletal: Degenerative changes of the spine. No acute or suspicious abnormality IMPRESSION: 1. Portal venous gas in the caudate and left hepatic lobe, uncertain source or etiology. No areas of abnormal bowel wall thickening or intramural air are visualized. Suggest correlation with lactic acid levels. 2. 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 Atrophic left kidney Electronically Signed   By: Donavan Foil M.D.   On: 09/03/2017 21:36    EKG: Independently reviewed.  Assessment/Plan Principal Problem:   Abdominal pain Active Problems:    Essential hypertension   Arterial atherosclerosis   HLD (hyperlipidemia)   PUD (peptic ulcer disease)    1. Abdominal pain - 1. DDX includes but not limited to ischemia and PUD 2. Concerning findings for possible mesenteric ischemia in this patient include 1. Pain out of proportion to physical exam findings 2. Positive lactate 3. Portal venous gas on CT scan 4. Extensive history of atherosclerotic vascular disease (see below). 3. Dr. Oneida Alar consulted by EDP: 1. requests emergent ED to ED transfer for him to evaluate 4. Will keep patient NPO for now 5. IVF: NS at 125 6. I will put in tele-obs orders into sign and held for IP observation and GI will eval in AM; to be released if Dr. Oneida Alar decides patient doesn't need to go to OR tonight. 7. Obviously if patient needs to go to OR tonight then will likely need SDU / ICU post-op. 2. H/o PUD - 1. Will put on PPI GTT in case this represents ulcerative disease recurrence. 2. Dr. Carlean Purl consulted and will see in AM if he ends up going the tele-obs route. 3. HTN - 1. Holding off on ordering home BP meds pending Dr. Oneida Alar' evaluation 4. HLD - 1. Holding off on re-ordering statin pending Dr. Oneida Alar' evaluation. 5. Atherosclerotic disease - 1. Includes prior NSTEMI, CVA, B carotid stenosis, vertebral artery occlusion, Arm artery stenosis (BP always lower on one arm than the other), renal artery stenosis (may have others as well).  DVT prophylaxis: SCDs only for now Code Status: Full Family Communication: No family in room Disposition Plan: Home after admit Consults called: EDP spoke with Dr. Carlean Purl, then Dr. Oneida Alar Admission status: Place in obs for now (obviously if it is mesenteric ischemia, convert to IP).   Etta Quill DO [facility] Pager [phone]  If 7AM-7PM, please contact day team taking care of patient [email or web address] Password [email or web address]  09/04/2017, 12:05 AM

## 2017-09-04 NOTE — Consult Note (Addendum)
Reason for Consult:  portal venous gas and abdominal pain Referring Physician: Dina Rich  Luke Kaufman is an 67 y.o. male.  HPI:  Pt is a 67 yo M who presented to [facility] with sudden abdominal pain that occurred around 12 hours ago.  He has history of CAD, CVA, PVD, HTN, smoking, ulcers.  He has no h/o abdominal surgery.  The pain was accompanied by a cold sweat.  The residual pain is achy.  No h/o emesis.  No sick contacts.  No constipation or diarrhea.  No bloody stools.  No NSAIDS.    Past Medical History:  Diagnosis Date  . Gastric ulcer   . Hyperlipemia   . Hypertension   . MI (myocardial infarction) ([place])   . Stroke ([place])   . Tobacco abuse     Past Surgical History:  Procedure Laterality Date  . ESOPHAGOGASTRODUODENOSCOPY N/A 08/09/2014   Procedure: ESOPHAGOGASTRODUODENOSCOPY (EGD);  Surgeon: Jerene Bears, MD;  Location: [facility];  Service: Gastroenterology;  Laterality: N/A;  . ESOPHAGOGASTRODUODENOSCOPY N/A 08/10/2014   Procedure: ESOPHAGOGASTRODUODENOSCOPY (EGD);  Surgeon: Jerene Bears, MD;  Location: [facility];  Service: Gastroenterology;  Laterality: N/A;    Family History  Problem Relation Age of Onset  . Pancreatic cancer Mother   . Heart attack Father 70    Social History:  reports that he has been smoking cigarettes.  He has been smoking about 1.50 packs per day. He has never used smokeless tobacco. He reports that he drinks alcohol. He reports that he does not use drugs.  Allergies: No Known Allergies  Medications:  Current Meds  Medication Sig  . amLODipine (NORVASC) 10 MG tablet Take 1 tablet (10 mg total) by mouth daily.  [name] aspirin EC 81 MG tablet Take 81 mg by mouth every 6 (six) hours as needed for moderate pain.  [name] atorvastatin (LIPITOR) 40 MG tablet Take 40 mg by mouth daily.  [name] gemfibrozil (LOPID) 600 MG tablet Take 600 mg by mouth 2 (two) times daily before a meal.   . metoprolol tartrate (LOPRESSOR) 100 MG tablet Take 100 mg by  mouth 2 (two) times daily.  [name] triamterene-hydrochlorothiazide (MAXZIDE-25) 37.5-25 MG tablet TAKE 1 TABLET BY MOUTH DAILY     Results for orders placed or performed during the hospital encounter of 09/03/17 (from the past 48 hour(s))  Lipase, blood     Status: None   Collection Time: 09/03/17  4:44 PM  Result Value Ref Range   Lipase 37 11 - 51 U/L    Comment: Performed at [facility], [place] [address], [place], [place] [address]  Comprehensive metabolic panel     Status: Abnormal   Collection Time: 09/03/17  4:44 PM  Result Value Ref Range   Sodium 138 135 - 145 mmol/L   Potassium 4.0 3.5 - 5.1 mmol/L   Chloride 101 101 - 111 mmol/L   CO2 22 22 - 32 mmol/L   Glucose, Bld 172 (H) 65 - 99 mg/dL   BUN 16 6 - 20 mg/dL   Creatinine, Ser 1.44 (H) 0.61 - 1.24 mg/dL   Calcium 9.2 8.9 - 10.3 mg/dL   Total Protein 8.4 (H) 6.5 - 8.1 g/dL   Albumin 4.0 3.5 - 5.0 g/dL   AST 25 15 - 41 U/L   ALT 16 (L) 17 - 63 U/L   Alkaline Phosphatase 104 38 - 126 U/L   Total Bilirubin 0.6 0.3 - 1.2 mg/dL   GFR calc non Af Amer 49 (  L) >60 mL/min   GFR calc Af Amer 57 (L) >60 mL/min    Comment: (NOTE) The eGFR has been calculated using the CKD EPI equation. This calculation has not been validated in all clinical situations. eGFR's persistently <60 mL/min signify possible Chronic Kidney Disease.    Anion gap 15 5 - 15    Comment: Performed at [facility], [place] [address] [place], [place] [address]  CBC     Status: Abnormal   Collection Time: 09/03/17  4:44 PM  Result Value Ref Range   WBC 11.4 (H) 4.0 - 10.5 K/uL   RBC 5.46 4.22 - 5.81 MIL/uL   Hemoglobin 16.7 13.0 - 17.0 g/dL   HCT 48.7 39.0 - 52.0 %   MCV 89.2 78.0 - 100.0 fL   MCH 30.6 26.0 - 34.0 pg   MCHC 34.3 30.0 - 36.0 g/dL   RDW 13.5 11.5 - 15.5 %   Platelets 263 150 - 400 K/uL    Comment: Performed at [facility], [place] [address] [place],  [address]  Urinalysis, Routine w  reflex microscopic     Status: Abnormal   Collection Time: 09/03/17 10:11 PM  Result Value Ref Range   Color, Urine YELLOW YELLOW   APPearance CLEAR CLEAR   Specific Gravity, Urine 1.032 (H) 1.005 - 1.030   pH 5.0 5.0 - 8.0   Glucose, UA NEGATIVE NEGATIVE mg/dL   Hgb urine dipstick NEGATIVE NEGATIVE   Bilirubin Urine NEGATIVE NEGATIVE   Ketones, ur NEGATIVE NEGATIVE mg/dL   Protein, ur NEGATIVE NEGATIVE mg/dL   Nitrite NEGATIVE NEGATIVE   Leukocytes, UA NEGATIVE NEGATIVE    Comment: Performed at [facility] [address] [place],  [address]  I-Stat CG4 Lactic Acid, ED     Status: Abnormal   Collection Time: 09/03/17 10:52 PM  Result Value Ref Range   Lactic Acid, Venous 3.47 (HH) 0.5 - 1.9 mmol/L   Comment NOTIFIED PHYSICIAN     Ct Abdomen Pelvis W Contrast  Result Date: 09/03/2017 CLINICAL DATA:  Abdominal pain with nausea history of ulcer EXAM: CT ABDOMEN AND PELVIS WITH CONTRAST TECHNIQUE: Multidetector CT imaging of the abdomen and pelvis was performed using the standard protocol following bolus administration of intravenous contrast. CONTRAST:  100 cc Isovue-300 intravenous COMPARISON:  Renal ultrasound 05/19/2013 FINDINGS: Lower chest: Lung bases demonstrate patchy dependent atelectasis on the right. No pleural effusion. Heart size upper normal. Coronary vascular calcification. Small hiatal hernia. Hepatobiliary: Peripherally distributed gas in the caudate and left hepatic lobe consistent with portal venous gas. No calcified gallstones. No biliary dilatation Pancreas: Fatty atrophy with no inflammation Spleen: Normal in size without focal abnormality. Adrenals/Urinary Tract: Adrenal glands are within normal limits. Atrophic left kidney. No hydronephrosis. Bladder is normal Stomach/Bowel: Stomach is nonenlarged. No dilated small bowel. No colon wall thickening. Negative appendix. Sigmoid colon diverticular disease without acute inflammation  Vascular/Lymphatic: Moderate severe aortic atherosclerosis. No aneurysm. No significantly enlarged lymph nodes. Reproductive: Enlarged prostate Other: Negative for free air or free fluid. Musculoskeletal: Degenerative changes of the spine. No acute or suspicious abnormality IMPRESSION: 1. Portal venous gas in the caudate and left hepatic lobe, uncertain source or etiology. No areas of abnormal bowel wall thickening or intramural air are visualized. Suggest correlation with lactic acid levels. 2. 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 Atrophic left kidney Electronically Signed   By: Donavan Foil M.D.   On: 09/03/2017 21:36    Review of Systems  Unable to perform ROS: Critical illness  Constitutional: Positive for chills and fever.   Blood pressure (!) 76/49, pulse (!) 107, temperature (!) 100.7 F (38.2 C), temperature source Axillary, resp. rate (!) 25, SpO2 95 %. Physical Exam  Constitutional: He appears well-developed and well-nourished. He appears distressed.  rigors  HENT:  Head: Normocephalic and atraumatic.  Right Ear: External ear normal.  Left Ear: External ear normal.  Nose: Nose normal.  Eyes: Conjunctivae are normal. Right eye exhibits no discharge. Left eye exhibits no discharge. No scleral icterus.  Neck: Neck supple. No tracheal deviation present. No thyromegaly present.  Cardiovascular: Normal rate, regular rhythm, normal heart sounds and intact distal pulses.  Respiratory: Effort normal and breath sounds normal. No respiratory distress.  GI: Soft. He exhibits distension (mildly). He exhibits no mass. There is tenderness (mild RUQ/epigastric tenderness). There is no rebound and no guarding.  Musculoskeletal: Normal range of motion. He exhibits edema (trace).  Skin: Skin is warm and dry. No rash noted. He is not diaphoretic. No erythema. No pallor.  Chronic venous stasis changes  Psychiatric:  Mumbling, says "leave me alone!"  Won't answer questions     Assessment/Plan:  Acute abdominal pain Portal venous gas H/o ulcer disease CAD/PVD H/o CVA Altered mental status likely secondary to fever.  Concern for mesenteric ischemia. No evidence of pneumotosis No evidence of peritonitis Vascular surgery planning arteriogram tonight to assess for acute arterial ischemia Agree with treating for ulcer disease given association of portal venous gas with ulcer disease.   Agree with antibiotic therapy given portal venous gas and fever.   Maintain good hydration Surgery team to follow.    Stark Klein 09/04/2017, 12:33 AM

## 2017-09-04 NOTE — Op Note (Addendum)
09/03/2017 - 09/04/2017  9:28 AM  PATIENT:  Luke Kaufman  67 y.o. male  Patient Care Team: Jefm Petty, MD as PCP - General (Family Medicine)  PRE-OPERATIVE DIAGNOSIS:  MESENTERIC ISCHEMIA  POST-OPERATIVE DIAGNOSIS:  MESENTERIC ISCHEMIA  PROCEDURE:  1. Abdominal exploration 2. Application of temporary abdominal closure device (ABTHERA)  SURGEON:  Sharon Mt. Dema Severin, MD  ASSISTANT: Gae Gallop, MD - Vascular surgery  ANESTHESIA:   general  COUNTS:  After I scrubbed, I requested a count. There was a missing needle driver and hemostat. An abdominal x-ray was therefore performed and showed no radiopaque foreign bodies.  EBL: 0cc for my portion of the procedure  DRAINS: Temporary abdominal closure device  SPECIMEN: none  COMPLICATIONS: none  FINDINGS: I was called intraoperatively to assist with abdominal closure.  The patient had previously undergone exploratory laparotomy and SMA bypass by Drs. Byerly and Fields.  The bypass had been completed and I was called to assist with closure.  The abdominal cavity was inspected.  The stomach was clearly viable as was the visible portion of duodenum, entire small bowel to the point of a bowel resection.  There was a healthy-appearing staple line at the distal ileum.  The cecum and ascending colon were surgically absent.  The transverse colon, descending colon, sigmoid, and intraperitoneal rectum were clearly viable.  There was one area of oozing along the omentum which was controlled with electrocautery.  The vascular surgery team subsequently reversed his heparin with protamine.  DISPOSITION: ICU in guarded condition  INDICATION: Please refer to findings  DESCRIPTION: I was called intraoperatively to assist with abdominal closure.  The patient had previously undergone exploratory laparotomy, right colectomy and SMA bypass by Drs. Byerly and Fields.  The bypass had been completed and I was called to assist with closure.  The  abdominal cavity was inspected. The stomach was clearly viable as was the visible portion of proximal duodenum, entire small bowel to the point of a bowel resection.  There was a healthy-appearing staple line at the distal ileum.  The cecum and ascending colon were surgically absent.  The transverse colon, descending colon, sigmoid, and intraperitoneal rectum were clearly viable and pink.  The liver, gallbladder and spleen were viable.  The omentum was viable.  The abdomen was irrigated with 2 L of warm saline and hemostasis verified.  Given the recent vascular event and initial concern for ischemic bowel, although none is present at this time, the decision was made to perform temporary abdominal closure leaving the intestinal tract in discontinuity with planned reexploration in the next 24 to 48 hours to reassess.  Note: This dictation was prepared with Dragon/digital dictation along with [employer]. Any transcriptional errors that result from this process are unintentional.

## 2017-09-04 NOTE — ED Notes (Signed)
Spoke with PA states nursing communication was not meant for pt, does not need an EKG

## 2017-09-04 NOTE — Op Note (Signed)
Procedure: #1 ultrasound right groin #2 cannulation right common femoral artery #3 nonselective aortogram #4 nonselective mesenteric angiogram #5 aorto to superior mesenteric artery bypass (8 mm Dacron)  Preoperative diagnosis: Acute mesenteric ischemia  Postoperative diagnosis: Same  Anesthesia: General  Assistant: Gae Gallop, MD, Laurence Slate, PA-C  Operative findings: #1 ischemic right colon resection per general surgery dictated a separate operative note  2.  Pink viable appearing small bowel at conclusion of procedure  Operative details: After obtaining informed consent, the patient was taken the operating.  The patient was placed in supine position operating table.  Both groins were prepped and draped in usual sterile fashion.  Ultrasound was used to identify the right common femoral artery and femoral bifurcation.  An introducer needle was then used to cannulate the right common femoral artery without difficulty.  An 63 Bentson wire was threaded up the abdominal aorta under fluoroscopic guidance.  Next a 5 French sheath was placed over the guidewire in the right common femoral artery.  This was thoroughly flushed with heparin saline.  Next the pigtail catheter was advanced over the guidewire into the abdominal aorta.  An abdominal aortogram was then obtained in a lateral projection.  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  The superior mesenteric artery was occluded.  It did reconstitute distally after about 10 seconds probably from filling of celiac collaterals.  At this point I decided to explore the patient with the intent of doing a mesenteric bypass.  Dr. Barry Dienes from general surgery was consulted.  The patient was reprepped and draped from the nipples to the knees.  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  Sewed urology was called to place a Foley catheter.  The penis was prepped into the operative  field.  Next a midline laparotomy incision was made extending from the xiphoid to the pubis.  Incision was carried through the subtendinous tissues down level of fascia the peritoneum was incised with full length incision.  Nasogastric tube was placed by the anesthesia team and this was felt to be in good position stomach.  Urology came in and placed the Foley catheter successfully.  Upon opening the abdomen the small bowel had a congested type appearance but had no areas of frank necrosis.  There was some murky fluid in the hepatic flexure area.  The cecum and ascending colon appeared very ischemic essentially infarcted.  Dr. Barry Dienes proceeded to do a right colectomy and staple off the terminal ileum.  This is dictated as a separate operative note.  After the colon was removed I proceeded to place the Omni retractor and exposed the infrarenal abdominal aorta.  This was fairly difficult due to the patient's obesity.  The infrarenal abdominal aorta was very heavily calcified but there was a soft area a few centimeters below the renal arteries.  In order to expose this I divided the left renal vein and oversewed this with a 5-0 Prolene proximally and distally.  The superior mesenteric artery was exposed by mobilizing the duodenum to the right side and taking down its attachments.  I then exposed the superior mesenteric artery at the root of the mesentery.  The artery was thickened on palpation but did appear to have a lumen.  Several centimeters were exposed and Vesseloops placed around this.  The patient was given 10000 units of intravenous heparin.  Aorta was clamped just below the renal arteries and  just above the inferior mesenteric artery.  A small opening was made in the aorta and this was extended with a 5 mm punch.  An 8 mm Dacron graft was then brought up in the operative field and sewn endograft to side of aorta using a running 5-0 Prolene suture.  The aorta was calcified and very friable and I had to remove  several segments of this calcified plaque in order to have a reasonable lumen to sew to.  The anastomosis was technically fairly challenging due to the patient's obesity.  A completion anastomosis of them was forebled backbled and thoroughly flushed.  The patient had a transient drop of blood pressure into the 70 systolic range and this quickly recovered with administration of fluid and Neo-Synephrine.  The graft was then cut the length a longitudinal opening was made in superior mesenteric artery after clamping proximally distally with Henley clamps.  The patient was given an additional 5000 units of heparin.  The graft was then sewn endograft to side of superior mesenteric artery using a running 6-0 Prolene suture.  Again this is fairly technically difficult due to the thickening of the superior mesenteric artery and the depth of the patient's SMA secondary to his obesity.  Just prior to completion of the anastomosis it was forebled backbled and thoroughly flushed.  Anastomosis was secured clamps released there is pulsatile flow in the distal SMA immediately.  There was good Doppler flow as well.  Small bowel began to pink up immediately and all appeared to be fairly viable at the conclusion of the bypass.  General surgery was called back in to inspect the bowel and a VAC dressing was placed and the patient will be scheduled for a second look laparotomy tomorrow.  The retroperitoneum was closed over the graft.  Everything was hemostatic.  The patient was taken to the ICU in stable but critical condition.  Ruta Hinds, MD [facility] [place] Office: [phone] Pager: [phone]

## 2017-09-04 NOTE — Progress Notes (Addendum)
I discussed / reviewed the pharmacy note by Almira Bar and I agree with the findings and plans as documented.  Hildred Laser, PharmD Clinical Pharmacist Clinical phone from 8:30-4:00 is [phone] After 4pm, please call Main Rx [phone]) for assistance. 09/04/2017 1:09 PM --  Pharmacy Antibiotic Note  Luke Kaufman is a 67 y.o. male admitted on 09/03/2017 with abdominal pain due to mesenteric ischemia. He is noted s/p right hemicolectomy.  Pharmacy has been consulted for zosyn dosing for empiric coverage. -CrCl ~ 60  Plan: - Initiate zosyn 3.375 g q8h. - Monitor for WBC, Scr, culture, temp, lactic acid, clinical improvement.    Temp (24hrs), Avg:99.8 F (37.7 C), Min:97.6 F (36.4 C), Max:101 F (38.3 C)  Recent Labs  Lab 09/03/17 1644 09/03/17 2252 09/04/17 0126  WBC 11.4*  --   --   CREATININE 1.44*  --   --   LATICACIDVEN  --  3.47* 3.20*    CrCl cannot be calculated (Unknown ideal weight.).    No Known Allergies  Antimicrobials this admission: Zosyn 5/16 >>  Dose adjustments this admission:  Microbiology results: None noted  Thank you for allowing pharmacy to be a part of this patient's care.  Almira Bar  [facility] PharmD Candidate 09/04/2017 11:43 AM

## 2017-09-04 NOTE — OR Nursing (Signed)
Multiple attempts to get foley cath in .  Dr Oneida Alar also tried.  Paged urology awaiting call back.

## 2017-09-04 NOTE — Anesthesia Procedure Notes (Signed)
Central Venous Catheter Insertion Performed by: Oleta Mouse, MD, anesthesiologist Start/End5/16/2019 3:30 AM, 09/04/2017 3:37 AM Patient location: OR. Preanesthetic checklist: patient identified, IV checked, site marked, risks and benefits discussed, surgical consent, monitors and equipment checked, pre-op evaluation, timeout performed and anesthesia consent Position: supine Hand hygiene performed  and maximum sterile barriers used  Catheter size: 8 Fr Total catheter length 16. Central line was placed.Double lumen Procedure performed without using ultrasound guided technique. Attempts: 1 Following insertion, dressing applied, line sutured and Biopatch. Post procedure assessment: blood return through all ports, free fluid flow and no air  Patient tolerated the procedure well with no immediate complications.

## 2017-09-05 ENCOUNTER — Inpatient Hospital Stay (HOSPITAL_COMMUNITY): Payer: PPO

## 2017-09-05 ENCOUNTER — Encounter (HOSPITAL_COMMUNITY): Payer: Self-pay | Admitting: Vascular Surgery

## 2017-09-05 DIAGNOSIS — K559 Vascular disorder of intestine, unspecified: Secondary | ICD-10-CM

## 2017-09-05 DIAGNOSIS — G9340 Encephalopathy, unspecified: Secondary | ICD-10-CM

## 2017-09-05 DIAGNOSIS — A419 Sepsis, unspecified organism: Secondary | ICD-10-CM

## 2017-09-05 DIAGNOSIS — R6521 Severe sepsis with septic shock: Secondary | ICD-10-CM

## 2017-09-05 DIAGNOSIS — J9601 Acute respiratory failure with hypoxia: Secondary | ICD-10-CM

## 2017-09-05 LAB — BLOOD GAS, ARTERIAL
Acid-base deficit: 9.1 mmol/L — ABNORMAL HIGH (ref 0.0–2.0)
Bicarbonate: 16.3 mmol/L — ABNORMAL LOW (ref 20.0–28.0)
Drawn by: 51155
FIO2: 40
PEEP: 8 cmH2O
PH ART: 7.28 — AB (ref 7.350–7.450)
Patient temperature: 99.6
RATE: 20 resp/min
VT: 540 mL
pCO2 arterial: 36.1 mmHg (ref 32.0–48.0)
pO2, Arterial: 80.2 mmHg — ABNORMAL LOW (ref 83.0–108.0)

## 2017-09-05 LAB — POCT I-STAT 3, ART BLOOD GAS (G3+)
ACID-BASE DEFICIT: 6 mmol/L — AB (ref 0.0–2.0)
ACID-BASE DEFICIT: 12 mmol/L — AB (ref 0.0–2.0)
ACID-BASE DEFICIT: 9 mmol/L — AB (ref 0.0–2.0)
Acid-base deficit: 8 mmol/L — ABNORMAL HIGH (ref 0.0–2.0)
Acid-base deficit: 7 mmol/L — ABNORMAL HIGH (ref 0.0–2.0)
BICARBONATE: 19.7 mmol/L — AB (ref 20.0–28.0)
BICARBONATE: 14.9 mmol/L — AB (ref 20.0–28.0)
BICARBONATE: 17.7 mmol/L — AB (ref 20.0–28.0)
Bicarbonate: 17.6 mmol/L — ABNORMAL LOW (ref 20.0–28.0)
Bicarbonate: 18.2 mmol/L — ABNORMAL LOW (ref 20.0–28.0)
O2 SAT: 97 %
O2 SAT: 93 %
O2 SAT: 93 %
O2 SAT: 93 %
O2 SAT: 95 %
PO2 ART: 100 mmHg (ref 83.0–108.0)
Patient temperature: 97.6
TCO2: 21 mmol/L — ABNORMAL LOW (ref 22–32)
TCO2: 16 mmol/L — ABNORMAL LOW (ref 22–32)
TCO2: 19 mmol/L — ABNORMAL LOW (ref 22–32)
TCO2: 19 mmol/L — AB (ref 22–32)
TCO2: 19 mmol/L — ABNORMAL LOW (ref 22–32)
pCO2 arterial: 40.5 mmHg (ref 32.0–48.0)
pCO2 arterial: 33.7 mmHg (ref 32.0–48.0)
pCO2 arterial: 39.8 mmHg (ref 32.0–48.0)
pCO2 arterial: 36.2 mmHg (ref 32.0–48.0)
pCO2 arterial: 36.9 mmHg (ref 32.0–48.0)
pH, Arterial: 7.294 — ABNORMAL LOW (ref 7.350–7.450)
pH, Arterial: 7.249 — ABNORMAL LOW (ref 7.350–7.450)
pH, Arterial: 7.254 — ABNORMAL LOW (ref 7.350–7.450)
pH, Arterial: 7.298 — ABNORMAL LOW (ref 7.350–7.450)
pH, Arterial: 7.303 — ABNORMAL LOW (ref 7.350–7.450)
pO2, Arterial: 76 mmHg — ABNORMAL LOW (ref 83.0–108.0)
pO2, Arterial: 78 mmHg — ABNORMAL LOW (ref 83.0–108.0)
pO2, Arterial: 75 mmHg — ABNORMAL LOW (ref 83.0–108.0)
pO2, Arterial: 87 mmHg (ref 83.0–108.0)

## 2017-09-05 LAB — ECHOCARDIOGRAM COMPLETE
HEIGHTINCHES: 67 in
WEIGHTICAEL: 4031.77 [oz_av]

## 2017-09-05 LAB — COMPREHENSIVE METABOLIC PANEL
ALT: 313 U/L — AB (ref 17–63)
ANION GAP: 9 (ref 5–15)
AST: 376 U/L — ABNORMAL HIGH (ref 15–41)
Albumin: 2.4 g/dL — ABNORMAL LOW (ref 3.5–5.0)
Alkaline Phosphatase: 47 U/L (ref 38–126)
BUN: 27 mg/dL — ABNORMAL HIGH (ref 6–20)
CALCIUM: 6.8 mg/dL — AB (ref 8.9–10.3)
CHLORIDE: 109 mmol/L (ref 101–111)
CO2: 17 mmol/L — ABNORMAL LOW (ref 22–32)
CREATININE: 2.32 mg/dL — AB (ref 0.61–1.24)
GFR calc Af Amer: 32 mL/min — ABNORMAL LOW (ref >60)
GFR calc non Af Amer: 27 mL/min — ABNORMAL LOW (ref >60)
Glucose, Bld: 141 mg/dL — ABNORMAL HIGH (ref 65–99)
Potassium: 4.2 mmol/L (ref 3.5–5.1)
Sodium: 135 mmol/L (ref 135–145)
Total Bilirubin: 0.8 mg/dL (ref 0.3–1.2)
Total Protein: 4.4 g/dL — ABNORMAL LOW (ref 6.5–8.1)

## 2017-09-05 LAB — TYPE AND SCREEN
ABO/RH(D): O POS
Antibody Screen: NEGATIVE
UNIT DIVISION: 0
Unit division: 0

## 2017-09-05 LAB — GLUCOSE, CAPILLARY
GLUCOSE-CAPILLARY: 133 mg/dL — AB (ref 65–99)
GLUCOSE-CAPILLARY: 135 mg/dL — AB (ref 65–99)
GLUCOSE-CAPILLARY: 137 mg/dL — AB (ref 65–99)
GLUCOSE-CAPILLARY: 151 mg/dL — AB (ref 65–99)
GLUCOSE-CAPILLARY: 163 mg/dL — AB (ref 65–99)
Glucose-Capillary: 125 mg/dL — ABNORMAL HIGH (ref 65–99)
Glucose-Capillary: 178 mg/dL — ABNORMAL HIGH (ref 65–99)

## 2017-09-05 LAB — BASIC METABOLIC PANEL
ANION GAP: 6 (ref 5–15)
BUN: 28 mg/dL — ABNORMAL HIGH (ref 6–20)
CALCIUM: 6.2 mg/dL — AB (ref 8.9–10.3)
CHLORIDE: 111 mmol/L (ref 101–111)
CO2: 19 mmol/L — AB (ref 22–32)
Creatinine, Ser: 2.29 mg/dL — ABNORMAL HIGH (ref 0.61–1.24)
GFR calc non Af Amer: 28 mL/min — ABNORMAL LOW (ref >60)
GFR, EST AFRICAN AMERICAN: 32 mL/min — AB (ref >60)
GLUCOSE: 167 mg/dL — AB (ref 65–99)
POTASSIUM: 4 mmol/L (ref 3.5–5.1)
Sodium: 136 mmol/L (ref 135–145)

## 2017-09-05 LAB — CBC
HCT: 37.7 % — ABNORMAL LOW (ref 39.0–52.0)
Hemoglobin: 12.8 g/dL — ABNORMAL LOW (ref 13.0–17.0)
MCH: 30.5 pg (ref 26.0–34.0)
MCHC: 34 g/dL (ref 30.0–36.0)
MCV: 90 fL (ref 78.0–100.0)
PLATELETS: 162 10*3/uL (ref 150–400)
RBC: 4.19 MIL/uL — AB (ref 4.22–5.81)
RDW: 14 % (ref 11.5–15.5)
WBC: 11 10*3/uL — ABNORMAL HIGH (ref 4.0–10.5)

## 2017-09-05 LAB — PROCALCITONIN: Procalcitonin: 3.22 ng/mL

## 2017-09-05 LAB — BPAM RBC
BLOOD PRODUCT EXPIRATION DATE: 201906082359
Blood Product Expiration Date: 201906092359
ISSUE DATE / TIME: 201905160758
ISSUE DATE / TIME: 201905160758
UNIT TYPE AND RH: 5100
Unit Type and Rh: 5100

## 2017-09-05 LAB — TROPONIN I
TROPONIN I: 0.55 ng/mL — AB (ref <0.03)
TROPONIN I: 0.27 ng/mL — AB (ref <0.03)
Troponin I: 0.36 ng/mL (ref <0.03)

## 2017-09-05 LAB — COOXEMETRY PANEL
Carboxyhemoglobin: 1 % (ref 0.5–1.5)
Methemoglobin: 1.5 % (ref 0.0–1.5)
O2 Saturation: 69.4 %
Total hemoglobin: 12.3 g/dL (ref 12.0–16.0)

## 2017-09-05 LAB — MAGNESIUM: Magnesium: 1.4 mg/dL — ABNORMAL LOW (ref 1.7–2.4)

## 2017-09-05 LAB — LACTIC ACID, PLASMA: Lactic Acid, Venous: 1.4 mmol/L (ref 0.5–1.9)

## 2017-09-05 LAB — AMYLASE: Amylase: 60 U/L (ref 28–100)

## 2017-09-05 MED ORDER — CHLORHEXIDINE GLUCONATE CLOTH 2 % EX PADS
6.0000 | MEDICATED_PAD | Freq: Once | CUTANEOUS | Status: AC
  Administered 2017-09-05: 6 via TOPICAL

## 2017-09-05 MED ORDER — SODIUM BICARBONATE 8.4 % IV SOLN
INTRAVENOUS | Status: DC
  Administered 2017-09-05 – 2017-09-07 (×6): via INTRAVENOUS
  Filled 2017-09-05 (×11): qty 150

## 2017-09-05 MED ORDER — SODIUM CHLORIDE 0.9 % IV SOLN
Freq: Once | INTRAVENOUS | Status: AC
  Administered 2017-09-05: 09:00:00 via INTRAVENOUS

## 2017-09-05 MED ORDER — MAGNESIUM SULFATE 2 GM/50ML IV SOLN
2.0000 g | Freq: Once | INTRAVENOUS | Status: AC
  Administered 2017-09-05: 2 g via INTRAVENOUS
  Filled 2017-09-05: qty 50

## 2017-09-05 MED ORDER — SODIUM CHLORIDE 0.9 % IV SOLN
1.0000 g | Freq: Once | INTRAVENOUS | Status: AC
  Administered 2017-09-05: 1 g via INTRAVENOUS
  Filled 2017-09-05: qty 10

## 2017-09-05 MED ORDER — LACTATED RINGERS IV SOLN
INTRAVENOUS | Status: DC
  Administered 2017-09-05 – 2017-09-09 (×6): via INTRAVENOUS

## 2017-09-05 MED ORDER — SODIUM CHLORIDE 0.9 % IV BOLUS
500.0000 mL | Freq: Once | INTRAVENOUS | Status: AC
  Administered 2017-09-06: 500 mL via INTRAVENOUS

## 2017-09-05 MED ORDER — SODIUM CHLORIDE 0.9 % IV BOLUS
2000.0000 mL | Freq: Once | INTRAVENOUS | Status: AC
  Administered 2017-09-05: 2000 mL via INTRAVENOUS

## 2017-09-05 NOTE — Progress Notes (Signed)
Patient ID: Luke Kaufman, male   DOB: [birth year], 67 y.o.   MRN: [id number]    1 Day Post-Op  Subjective: Pt sedated on vent.  RN states he is on 54mc of levo and his bowel has started to protrude of out dressing.  Minimal UOP  Objective: Vital signs in last 24 hours: Temp:  [97.6 F (36.4 C)-99.6 F (37.6 C)] 97.6 F (36.4 C) (05/17 0744) Pulse Rate:  [57-111] 96 (05/17 0808) Resp:  [0-127] 20 (05/17 0808) BP: (97)/(40) 97/40 (05/17 0808) SpO2:  [91 %-100 %] 94 % (05/17 0808) Arterial Line BP: (84-140)/(39-81) 123/57 (05/17 0600) FiO2 (%):  [40 %-60 %] 40 % (05/17 0808) Weight:  [114.3 kg (251 lb 15.8 oz)] 114.3 kg (251 lb 15.8 oz) (05/16 1025)    Intake/Output from previous day: 05/16 0701 - 05/17 0700 In: 7844 [I.V.:5114; Blood:630; IV JOACZYSAY:3016] Out: 4150 [Urine:625; Emesis/NG output:275; Drains:1950; Blood:1300] Intake/Output this shift: No intake/output data recorded.  PE: Heart: regular, but tachy in 100s Lungs: CTAB on vent Abd: soft, obese, open abdomen, -BS, bowel is protruding from underneath the dressing.  VAC with around 1950cc out since placement.  Serosang.  NGT with bilious output about 500cc in cannister       Lab Results:  Recent Labs    09/04/17 1036 09/04/17 2238  WBC 13.9* 10.1  HGB 13.0 13.7  HCT 38.9* 40.8  PLT 178 192   BMET Recent Labs    09/03/17 1644  09/04/17 0857 09/04/17 1036  NA 138   < > 138 139  K 4.0   < > 4.7 4.3  CL 101  --   --  108  CO2 22  --   --  21*  GLUCOSE 172*  --   --  141*  BUN 16  --   --  16  CREATININE 1.44*  --   --  1.50*  CALCIUM 9.2  --   --  7.8*   < > = values in this interval not displayed.   PT/INR Recent Labs    09/04/17 1036  LABPROT 18.9*  INR 1.60   CMP     Component Value Date/Time   NA 139 09/04/2017 1036   K 4.3 09/04/2017 1036   CL 108 09/04/2017 1036   CO2 21 (L) 09/04/2017 1036   GLUCOSE 141 (H) 09/04/2017 1036   BUN 16 09/04/2017 1036   CREATININE 1.50 (H)  09/04/2017 1036   CALCIUM 7.8 (L) 09/04/2017 1036   PROT 8.4 (H) 09/03/2017 1644   ALBUMIN 4.0 09/03/2017 1644   AST 25 09/03/2017 1644   ALT 16 (L) 09/03/2017 1644   ALKPHOS 104 09/03/2017 1644   BILITOT 0.6 09/03/2017 1644   GFRNONAA 46 (L) 09/04/2017 1036   GFRAA 54 (L) 09/04/2017 1036   Lipase     Component Value Date/Time   LIPASE 37 09/03/2017 1644       Studies/Results: Ct Abdomen Pelvis W Contrast  Result Date: 09/03/2017 CLINICAL DATA:  Abdominal pain with nausea history of ulcer EXAM: CT ABDOMEN AND PELVIS WITH CONTRAST TECHNIQUE: Multidetector CT imaging of the abdomen and pelvis was performed using the standard protocol following bolus administration of intravenous contrast. CONTRAST:  100 cc Isovue-300 intravenous COMPARISON:  Renal ultrasound 05/19/2013 FINDINGS: Lower chest: Lung bases demonstrate patchy dependent atelectasis on the right. No pleural effusion. Heart size upper normal. Coronary vascular calcification. Small hiatal hernia. Hepatobiliary: Peripherally distributed gas in the caudate and left hepatic lobe consistent with portal venous gas. No  calcified gallstones. No biliary dilatation Pancreas: Fatty atrophy with no inflammation Spleen: Normal in size without focal abnormality. Adrenals/Urinary Tract: Adrenal glands are within normal limits. Atrophic left kidney. No hydronephrosis. Bladder is normal Stomach/Bowel: Stomach is nonenlarged. No dilated small bowel. No colon wall thickening. Negative appendix. Sigmoid colon diverticular disease without acute inflammation Vascular/Lymphatic: Moderate severe aortic atherosclerosis. No aneurysm. No significantly enlarged lymph nodes. Reproductive: Enlarged prostate Other: Negative for free air or free fluid. Musculoskeletal: Degenerative changes of the spine. No acute or suspicious abnormality IMPRESSION: 1. Portal venous gas in the caudate and left hepatic lobe, uncertain source or etiology. No areas of abnormal bowel wall  thickening or intramural air are visualized. Suggest correlation with lactic acid levels. 2. 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 Atrophic left kidney Electronically Signed   By: Donavan Foil M.D.   On: 09/03/2017 21:36   Dg Chest Port 1 View  Result Date: 09/04/2017 CLINICAL DATA:  Hypoxia EXAM: PORTABLE CHEST 1 VIEW COMPARISON:  November 23, 2015 FINDINGS: Endotracheal tube tip is at the carina. Nasogastric tube tip and side port are below the diaphragm. Central catheter tip is in the superior vena cava. No pneumothorax. There is atelectatic change in the right mid lung. There is a small left pleural effusion. Lungs elsewhere are clear. Heart is mildly enlarged with pulmonary vascularity normal. No adenopathy. There is aortic atherosclerosis. No bone lesions. IMPRESSION: Tube and catheter positions as described without pneumothorax. Note that the endotracheal tube tip is at the carina. Suggest withdrawing endotracheal tube approximately 3 cm. Right midlung atelectasis. Small left pleural effusion. Lungs elsewhere clear. Stable cardiac prominence. These results will be called to the ordering clinician or representative by the Radiologist Assistant, and communication documented in the PACS or zVision Dashboard. Electronically Signed   By: Lowella Grip III M.D.   On: 09/04/2017 10:51   Dg Abd Portable 1v  Result Date: 09/04/2017 CLINICAL DATA:  Status post colon resection and laparotomy. EXAM: PORTABLE ABDOMEN - 1 VIEW COMPARISON:  Radiographs of same day. FINDINGS: The bowel gas pattern is normal. Distal tip of nasogastric tube is seen in expected position of proximal stomach. No radio-opaque calculi or other significant radiographic abnormality are seen. IMPRESSION: Distal tip of nasogastric tube is seen in expected position of proximal stomach. No evidence of bowel obstruction or ileus. Electronically Signed   By: Marijo Conception, M.D.   On: 09/04/2017 11:41   Dg Or Local  Abdomen  Result Date: 09/04/2017 CLINICAL DATA:  Instrument count in the operating room EXAM: OR LOCAL ABDOMEN COMPARISON:  CT abdomen and pelvis of 09/03/2017 FINDINGS: Portable supine views of the abdomen show no opaque foreign body. Bowel gas is present which may indicate mild ileus. Single small amount of contrast is noted in the urinary bladder with Foley catheter present. IMPRESSION: No opaque foreign body is seen. I called this report to Otila Kluver in the operating room at the time of interpretation. Electronically Signed   By: Ivar Drape M.D.   On: 09/04/2017 09:52    Anti-infectives: Anti-infectives (From admission, onward)   Start     Dose/Rate Route Frequency Ordered Stop   09/04/17 1400  piperacillin-tazobactam (ZOSYN) IVPB 3.375 g     3.375 g 12.5 mL/hr over 240 Minutes Intravenous Every 8 hours 09/04/17 1157     09/04/17 1100  ceFAZolin (ANCEF) IVPB 2g/100 mL premix  Status:  Discontinued     2 g 200 mL/hr over 30 Minutes Intravenous Every 8 hours 09/04/17  1035 09/04/17 1157   09/04/17 0630  piperacillin-tazobactam (ZOSYN) IVPB 3.375 g  Status:  Discontinued     3.375 g 100 mL/hr over 30 Minutes Intravenous  Once 09/04/17 0617 09/04/17 0626   09/04/17 0619  piperacillin-tazobactam (ZOSYN) IVPB 3.375 g     3.375 g 100 mL/hr over 30 Minutes Intravenous 60 min pre-op 09/04/17 0620 09/04/17 0717   09/04/17 0030  piperacillin-tazobactam (ZOSYN) IVPB 3.375 g     3.375 g 100 mL/hr over 30 Minutes Intravenous  Once 09/04/17 0024 09/04/17 0144       Assessment/Plan Mesenteric ischemia POD 1, s/p right colectomy, discontinuity, open abdominal VAC applied by Dr. Dorris Fetch Byerly/Dr. Annye English POD 1, s/p aortogram with aorto to superior mesenteric artery bypass (8 mm Dacron) by Dr. Ruta Hinds -patient on pressor support this morning still with hypotension. -low UOP, 200cc overnight -unfortunately, bowel is beginning to protrude out from abdominal VAC.  Plan was for return to OR  tomorrow after better resuscitated and to see if he can get off pressors prior to anastomosis; however, will need to discuss current finding with Dr. Dema Severin to determine if he will need a VAC change today to minimize risk to protruding bowel. -cont sedation secondary to open abdomen. -labs pending -on zosyn  VDRF -per CCM  H/O HTN/CAD/MI Troponin bumped to 0.5 yesterday, but not rechecked.  Will order now to see where it's trending.  Decreased UOP - per primary, CCM.   -MAP 55 and hypotensive on pressor.  Decrease in UOP and a lot of output from abdominal VAC, ? Need for more fluid; however, he does appear to be likely be third spacing given distention of bowel and protrusion, likely has some mesenteric edema Hypotension -per CCM, on pressors   FEN - NPO, NGT VTE - SCDs/no chemical prophylaxis currently ID - zosyn  Dispo -  Would like to wait to go back to OR until tomorrow, but given protruding bowel from under VAC, will need to d/w Dr. Dema Severin to determine if this can just be followed or if he needs to go back today for a VAC change.   LOS: 1 day    Henreitta Cea , [facility] 09/05/2017, 8:20 AM Pager: [phone]

## 2017-09-05 NOTE — Progress Notes (Signed)
PT Cancellation Note  Patient Details Name: Luke Kaufman MRN: [id number] DOB: Feb 14, 1951   Cancelled Treatment:    Reason Eval/Treat Not Completed: Medical issues which prohibited therapy Attempted to evaluate patient at [facility], per RN pt not stable or appropriate to work with therapy, will cont to follow acutely.   Reinaldo Berber, PT, DPT Acute Rehab Services Pager: [phone]     Reinaldo Berber 09/05/2017, 8:20 AM

## 2017-09-05 NOTE — Progress Notes (Signed)
  Echocardiogram 2D Echocardiogram has been performed.  Luke Kaufman [name] 09/05/2017, 3:50 PM

## 2017-09-05 NOTE — Progress Notes (Signed)
Oliguric this afternoon but urine output increased after 2L NS bolus.  Currently getting about 300 mL fluid per hour.  On bicarb levophed neo drip. Hopefully third space losses and sepsis will resolve soon.  Filling pressures still reasonable would continue to fluid bolus every few hours as necessary.  2nd look tomorrow per general surgery  Ruta Hinds, MD [facility] [place] Office: [phone] Pager: [phone]

## 2017-09-05 NOTE — Progress Notes (Signed)
Initial Nutrition Assessment  DOCUMENTATION CODES:   Obesity unspecified  INTERVENTION:   Monitor ability to start trickle feedings  Pt is at high nutrition risk due to catabolic illness, if NPO status expected recommend initiation of TPN.    NUTRITION DIAGNOSIS:   Increased nutrient needs related to wound healing as evidenced by estimated needs.  GOAL:   Provide needs based on ASPEN/SCCM guidelines  MONITOR:   Skin, I & O's  REASON FOR ASSESSMENT:   Ventilator    ASSESSMENT:   Pt with PMH of HTN, active smoker (2 PPD), CAD, MI, stroke, PAD who was admitted 5/15 with severe epigastric and periumbilical pain found to have ischemic R colon. Pt s/p ex lap with R hemicolectomy 5/16.   Abd remains open with high output VAC (2500 ml x 24 hr) Plan for closure vs ileostomy 5/18  Patient is currently intubated on ventilator support  Medications reviewed and include: colace, SSI, 2 g mag sulfate x 1 and PRN 40 mcg levophed NS @ 200 ml/hr due to volume losses Labs reviewed: magnesium 1.4 (L), AST/ALT: 376/313 (H)    NUTRITION - FOCUSED PHYSICAL EXAM:    Most Recent Value  Orbital Region  No depletion  Upper Arm Region  No depletion  Thoracic and Lumbar Region  No depletion  Buccal Region  No depletion  Temple Region  No depletion  Clavicle Bone Region  No depletion  Clavicle and Acromion Bone Region  No depletion  Scapular Bone Region  Unable to assess  Dorsal Hand  No depletion  Patellar Region  No depletion  Anterior Thigh Region  No depletion  Posterior Calf Region  No depletion  Edema (RD Assessment)  Mild  Hair  Reviewed  Eyes  Unable to assess  Mouth  Unable to assess  Skin  Reviewed  Nails  Reviewed       Diet Order:   Diet Order           Diet NPO time specified  Diet effective now          EDUCATION NEEDS:   No education needs have been identified at this time  Skin:  Skin Assessment: (open abdomen)  Last BM:  unknown  Height:   Ht  Readings from Last 1 Encounters:  09/04/17 5\' 7"  (1.702 m)    Weight:   Wt Readings from Last 1 Encounters:  09/04/17 251 lb 15.8 oz (114.3 kg)    Ideal Body Weight:  67.2 kg  BMI:  Body mass index is 39.47 kg/m.  Estimated Nutritional Needs:   Kcal:  1400-1700  Protein:  145-168 grams  Fluid:  > 2 L/day  Maylon Peppers RD, LDN, CNSC [phone] Pager [phone] After Hours Pager

## 2017-09-05 NOTE — Progress Notes (Signed)
[facility] [place]  Subjective  - relatively stable overnight although requiring some levophed   Objective (!) 97/40 96 97.6 F (36.4 C) (Oral) 20 94%  Intake/Output Summary (Last 24 hours) at 09/05/2017 0847 Last data filed at 09/05/2017 0800 Gross per 24 hour  Intake 5529.01 ml  Output 2850 ml  Net 2679.01 ml   Feet cool with doppler signals Abdomen open visible small bowel pink Urine output marginal Losing around 100-200 per hour from VAC serosanguinous fluid  Assessment/Planning: Hypovolemic shock/sepsis.  He is losing large amounts of fluid with 3rd space bowel edema and VAC losses  Bolus 1 liter saline now.  Increase maintenance fluids to 200/hr for now until we catch up  General surgery for second look laparotomy tomorrow  Wean levophed if possible  Vent per CCM.  We have room on FIO2 and PEEP to give more fluid.  Hemoglobin stable no active bleeding  Elevated troponin probably would benefit from [facility] 09/05/2017 8:47 AM --  Laboratory Lab Results: Recent Labs    09/04/17 1036 09/04/17 2238  WBC 13.9* 10.1  HGB 13.0 13.7  HCT 38.9* 40.8  PLT 178 192   BMET Recent Labs    09/03/17 1644  09/04/17 0857 09/04/17 1036  NA 138   < > 138 139  K 4.0   < > 4.7 4.3  CL 101  --   --  108  CO2 22  --   --  21*  GLUCOSE 172*  --   --  141*  BUN 16  --   --  16  CREATININE 1.44*  --   --  1.50*  CALCIUM 9.2  --   --  7.8*   < > = values in this interval not displayed.    COAG Lab Results  Component Value Date   INR 1.60 09/04/2017   INR 1.20 08/09/2014   INR 1.02 05/16/2013   No results found for: PTT

## 2017-09-05 NOTE — Progress Notes (Signed)
Dr. Oneida Alar updated on pt's poor urine output of 14ml in two hours. Total of 83ml since 0800 this morning, even after 1L NS bolus and changes in IV fluids. CVP is 9, BP 110/50's, HR 110's, O2 97%. Ordered to give 2L bolus of NS.

## 2017-09-05 NOTE — Progress Notes (Addendum)
PULMONARY / CRITICAL CARE MEDICINE   Name: Luke Kaufman MRN: [id number] DOB: 1951-02-22    ADMISSION DATE:  09/03/2017 CONSULTATION DATE:  5/16  REFERRING MD:  Oneida Alar   CHIEF COMPLAINT:  Post op Vent management   HISTORY OF PRESENT ILLNESS:   67 year old male active smoker (2 PPD) with history of hypertension, CAD, MI, stroke, PAD who presented 5/15 to the ED with complaints of severe epigastric and periumbilical pain.  ER work-up revealed lactate 3.5, fever.  CT abdomen was concerning for portal venous gas.  He was seen by vascular surgery with concern for mesenteric ischemia and underwent mesenteric angiography which showed ischemic right colon.  He then underwent exploratory lap by general surgery with right hemicolectomy.  Postop abd remains open with VAC, he remains on ventilator, transferred to ICU and PCCM consulted for ventilator and medical management.   SUBJECTIVE:  Labile BP, on 40 mcg of Levophed Large volume loss through [facility] dressing>> 500 cc last 4 hours, 2400 last 24 hours Volume repletion ( NS)  VITAL SIGNS: BP (!) 97/40   Pulse 96   Temp 97.6 F (36.4 C) (Oral)   Resp 20   Ht 5\' 7"  (1.702 m)   Wt 251 lb 15.8 oz (114.3 kg)   SpO2 94%   BMI 39.47 kg/m   HEMODYNAMICS: CVP:  [9 mmHg-30 mmHg] 11 mmHg  VENTILATOR SETTINGS: Vent Mode: PRVC FiO2 (%):  [40 %-60 %] 40 % Set Rate:  [18 bmp-20 bmp] 20 bmp Vt Set:  [540 mL-570 mL] 540 mL PEEP:  [5 cmH20-8 cmH20] 8 cmH20 Plateau Pressure:  [18 cmH20-22 cmH20] 18 cmH20  INTAKE / OUTPUT: I/O last 3 completed shifts: In: 15110.7 [I.V.:8414; Blood:630; IV Piggyback:6066.7] Out: 1517 [Urine:1125; Emesis/NG output:275; Drains:1950; Blood:1600]  PHYSICAL EXAMINATION: General:  Chronically ill appearing male, hemodynamically labile Neuro:  Sedated post op, RASS -3,  HEENT: NCAT,  Mm moist, ETT  Cardiovascular: S1, S2, tachy with PAC's,  Lungs:  resps even non labored on vent, coarse throughout , few crackles per  bases Abdomen:  Open, large wound VAC in place with bowel edema and  protrusion noted around VAC sponge Musculoskeletal:  Warm and dry, scant BLE edema , no mottling noted  LABS:  BMET Recent Labs  Lab 09/03/17 1644  09/04/17 0721 09/04/17 0857 09/04/17 1036  NA 138   < > 137 138 139  K 4.0   < > 4.3 4.7 4.3  CL 101  --   --   --  108  CO2 22  --   --   --  21*  BUN 16  --   --   --  16  CREATININE 1.44*  --   --   --  1.50*  GLUCOSE 172*  --   --   --  141*   < > = values in this interval not displayed.    Electrolytes Recent Labs  Lab 09/03/17 1644 09/04/17 1036  CALCIUM 9.2 7.8*  MG  --  1.8    CBC Recent Labs  Lab 09/03/17 1644  09/04/17 0857 09/04/17 1036 09/04/17 2238  WBC 11.4*  --   --  13.9* 10.1  HGB 16.7   < > 11.2* 13.0 13.7  HCT 48.7   < > 33.0* 38.9* 40.8  PLT 263  --   --  178 192   < > = values in this interval not displayed.    Coag's Recent Labs  Lab 09/04/17 1036  APTT 56*  INR 1.60  Sepsis Markers Recent Labs  Lab 09/03/17 2252 09/04/17 0126 09/04/17 1624  LATICACIDVEN 3.47* 3.20* 1.7    ABG Recent Labs  Lab 09/04/17 0721 09/04/17 0857 09/05/17 0355  PHART 7.353 7.307* 7.280*  PCO2ART 32.7 37.2 36.1  PO2ART 192.0* 261.0* 80.2*    Liver Enzymes Recent Labs  Lab 09/03/17 1644  AST 25  ALT 16*  ALKPHOS 104  BILITOT 0.6  ALBUMIN 4.0    Cardiac Enzymes Recent Labs  Lab 09/04/17 1036  TROPONINI 0.50*    Glucose Recent Labs  Lab 09/04/17 1313 09/04/17 1528 09/04/17 1955 09/04/17 2350 09/05/17 0341 09/05/17 0742  GLUCAP 146* 148* 143* 133* 125* 137*    Imaging Dg Chest Port 1 View  Result Date: 09/05/2017 CLINICAL DATA:  ETT placement EXAM: PORTABLE CHEST 1 VIEW COMPARISON:  Sep 04, 2017 FINDINGS: The ETT is in good position. The NG tube terminates below today's film. A right central line is stable terminating in the SVC. The cardiomediastinal silhouette is change unchanged. Left lung is clear.  Opacity in the medial right lung base is likely vascular crowding. No other interval changes. IMPRESSION: 1. Support apparatus as above. 2. Mild opacity in the medial right lung base is favored to represent vascular crowding. Recommend attention on follow-up. No other abnormalities. Electronically Signed   By: Dorise Bullion III M.D   On: 09/05/2017 08:28   Dg Chest Port 1 View  Result Date: 09/04/2017 CLINICAL DATA:  Hypoxia EXAM: PORTABLE CHEST 1 VIEW COMPARISON:  November 23, 2015 FINDINGS: Endotracheal tube tip is at the carina. Nasogastric tube tip and side port are below the diaphragm. Central catheter tip is in the superior vena cava. No pneumothorax. There is atelectatic change in the right mid lung. There is a small left pleural effusion. Lungs elsewhere are clear. Heart is mildly enlarged with pulmonary vascularity normal. No adenopathy. There is aortic atherosclerosis. No bone lesions. IMPRESSION: Tube and catheter positions as described without pneumothorax. Note that the endotracheal tube tip is at the carina. Suggest withdrawing endotracheal tube approximately 3 cm. Right midlung atelectasis. Small left pleural effusion. Lungs elsewhere clear. Stable cardiac prominence. These results will be called to the ordering clinician or representative by the Radiologist Assistant, and communication documented in the PACS or zVision Dashboard. Electronically Signed   By: Lowella Grip III M.D.   On: 09/04/2017 10:51   Dg Abd Portable 1v  Result Date: 09/04/2017 CLINICAL DATA:  Status post colon resection and laparotomy. EXAM: PORTABLE ABDOMEN - 1 VIEW COMPARISON:  Radiographs of same day. FINDINGS: The bowel gas pattern is normal. Distal tip of nasogastric tube is seen in expected position of proximal stomach. No radio-opaque calculi or other significant radiographic abnormality are seen. IMPRESSION: Distal tip of nasogastric tube is seen in expected position of proximal stomach. No evidence of bowel  obstruction or ileus. Electronically Signed   By: Marijo Conception, M.D.   On: 09/04/2017 11:41   Dg Or Local Abdomen  Result Date: 09/04/2017 CLINICAL DATA:  Instrument count in the operating room EXAM: OR LOCAL ABDOMEN COMPARISON:  CT abdomen and pelvis of 09/03/2017 FINDINGS: Portable supine views of the abdomen show no opaque foreign body. Bowel gas is present which may indicate mild ileus. Single small amount of contrast is noted in the urinary bladder with Foley catheter present. IMPRESSION: No opaque foreign body is seen. I called this report to Otila Kluver in the operating room at the time of interpretation. Electronically Signed   By: Windy Canny.D.  On: 09/04/2017 09:52     STUDIES:  CT abd/pelvis 5/15>>> 1. Portal venous gas in the caudate and left hepatic lobe, uncertain source or etiology. No areas of abnormal bowel wall thickening or intramural air are visualized. Suggest correlation with lactic acid levels. 2. 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 Atrophic left kidney  CULTURES: BC x 2 5/17 >>>  ANTIBIOTICS: Zosyn 5/16>>>  SIGNIFICANT EVENTS: OR 5/16>>>  LINES/TUBES: ETT 5/16>>>ischemic R colon, exp lap, R hemicolectomy    DISCUSSION: 67 year old male with ischemic bowel, status post exploratory lap 5/16 with postop vent needs.  ASSESSMENT / PLAN:  PULMONARY Acute respiratory failure-postop exploratory laparotomy for ischemic bowel Tobacco abuse-2 PPD CXR 5/17>>  mild opacity medial right base>> vascular crowding P:   Vent support - 8cc/kg  Trend CXR >> Large volume resuscitation ABG now No SBT/Wean for now  Smoking cessation PRN BD  CARDIOVASCULAR History of HTN History CAD, MI PAD Hypotension Tachy Troponin bump>> ? Ischemic demand Large VAC dressing output ( 2500 cc last 24)  ( 2/2 third spaced bowel edema) CVP is 11 + 10 L per flowsheet Lactate had returned to WNL, but will re-check as has become more pressor  dependent  P:  Telemetry monitoring Pressors to maintain MAP > 65 Fluid replacement for wound loss with LR for now CVP q 4 Trend  lactate, pct  Trend troponin until clear Check co-ox now EKG now and PRN Will order Echo   RENAL Lactic acidosis  Non-gap acidosis ( ? 2/2 saline repletion) AKI - mild.  Baseline Scr ~ 1.2.  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 Peptic ulcer disease Bowel perfusion and distension noted, Bowel is pink NG output about 300 since OR 5/16 Large volume output from VAC P:   N.P.O PPI Per surgery>> plan to take to OR 5/18 ( close or ileostomy) Antibiotics as above   HEMATOLOGIC No active issue  P:  F/u CBC now and in am Transfuse for HGB < 7  SCD's  Monitor for active bleeding  INFECTIOUS Mesenteric ischemia  Open abd  P:   Zosyn per pharmacy  Trend pct  Follow Micro Pan culture  ENDOCRINE Hyperglycemia  P:   hgbA1c  CBG Q 4 SSI   NEUROLOGIC Sedation needs (ooen abd) Per surgery, keep well sedated  P:   RASS goal: -3 Propofol, PRN fentanyl  No WUA prior to OR 5/18   FAMILY  - Updates: no family at bedside 5/17,   - Inter-disciplinary family meet or Palliative Care meeting due by:  Day Ardencroft, AGACNP-BC [place]. Pager: [phone].  09/05/2017  8:51 AM

## 2017-09-05 NOTE — Progress Notes (Addendum)
CRITICAL VALUE ALERT  Critical Value:  Calcium 6.2  Date & Time Notied:  09/05/17 1703  Provider Notified: Dr. Debbora Dus  Orders Received/Actions taken: Calcium gluconate IV  Dr. Debbora Dus also updated on 1600 ABG results, ordered to increase Bicarb drip to 163mL for 1L, then decrease rate back to 135mL

## 2017-09-05 NOTE — Progress Notes (Signed)
OT Cancellation Note  Patient Details Name: Luke Kaufman MRN: [id number] DOB: 1951-01-15   Cancelled Treatment:    Reason Eval/Treat Not Completed: Medical issues which prohibited therapy.  Pt currently intubated.  Ashia Dehner [place], OTR/L [phone]   [name] 09/05/2017, 1:14 PM

## 2017-09-06 ENCOUNTER — Inpatient Hospital Stay (HOSPITAL_COMMUNITY): Payer: PPO

## 2017-09-06 ENCOUNTER — Inpatient Hospital Stay (HOSPITAL_COMMUNITY): Payer: PPO | Admitting: Anesthesiology

## 2017-09-06 ENCOUNTER — Encounter (HOSPITAL_COMMUNITY)
Admission: EM | Disposition: A | Discharge status: Nursing Home (Includes ICF) | Payer: Self-pay | Source: Home / Self Care | Attending: Pulmonary Disease

## 2017-09-06 ENCOUNTER — Encounter (HOSPITAL_COMMUNITY): Payer: Self-pay | Admitting: Certified Registered"

## 2017-09-06 HISTORY — PX: APPLICATION OF WOUND VAC: SHX5189

## 2017-09-06 HISTORY — PX: LAPAROTOMY: SHX154

## 2017-09-06 HISTORY — PX: ILEOSTOMY: SHX1783

## 2017-09-06 LAB — POCT I-STAT 3, ART BLOOD GAS (G3+)
ACID-BASE DEFICIT: 1 mmol/L (ref 0.0–2.0)
Bicarbonate: 23.9 mmol/L (ref 20.0–28.0)
O2 Saturation: 97 %
PH ART: 7.401 (ref 7.350–7.450)
PO2 ART: 91 mmHg (ref 83.0–108.0)
TCO2: 25 mmol/L (ref 22–32)
pCO2 arterial: 38.5 mmHg (ref 32.0–48.0)

## 2017-09-06 LAB — GLUCOSE, CAPILLARY
GLUCOSE-CAPILLARY: 159 mg/dL — AB (ref 65–99)
GLUCOSE-CAPILLARY: 139 mg/dL — AB (ref 65–99)
Glucose-Capillary: 163 mg/dL — ABNORMAL HIGH (ref 65–99)
Glucose-Capillary: 164 mg/dL — ABNORMAL HIGH (ref 65–99)

## 2017-09-06 LAB — BASIC METABOLIC PANEL
Anion gap: 9 (ref 5–15)
BUN: 23 mg/dL — ABNORMAL HIGH (ref 6–20)
CALCIUM: 6.4 mg/dL — AB (ref 8.9–10.3)
CHLORIDE: 105 mmol/L (ref 101–111)
CO2: 24 mmol/L (ref 22–32)
CREATININE: 1.76 mg/dL — AB (ref 0.61–1.24)
GFR calc non Af Amer: 38 mL/min — ABNORMAL LOW (ref >60)
GFR, EST AFRICAN AMERICAN: 44 mL/min — AB (ref >60)
Glucose, Bld: 162 mg/dL — ABNORMAL HIGH (ref 65–99)
Potassium: 3.6 mmol/L (ref 3.5–5.1)
Sodium: 138 mmol/L (ref 135–145)

## 2017-09-06 LAB — CBC
HEMATOCRIT: 32.8 % — AB (ref 39.0–52.0)
Hemoglobin: 11.2 g/dL — ABNORMAL LOW (ref 13.0–17.0)
MCH: 30.5 pg (ref 26.0–34.0)
MCHC: 34.1 g/dL (ref 30.0–36.0)
MCV: 89.4 fL (ref 78.0–100.0)
Platelets: 147 10*3/uL — ABNORMAL LOW (ref 150–400)
RBC: 3.67 MIL/uL — ABNORMAL LOW (ref 4.22–5.81)
RDW: 13.8 % (ref 11.5–15.5)
WBC: 10.4 10*3/uL (ref 4.0–10.5)

## 2017-09-06 LAB — TROPONIN I: TROPONIN I: 0.29 ng/mL — AB (ref <0.03)

## 2017-09-06 LAB — PROCALCITONIN: Procalcitonin: 2.1 ng/mL

## 2017-09-06 LAB — URINE CULTURE: CULTURE: NO GROWTH

## 2017-09-06 SURGERY — LAPAROTOMY, EXPLORATORY
Anesthesia: General | Site: Abdomen | Laterality: Right

## 2017-09-06 MED ORDER — FENTANYL CITRATE (PF) 250 MCG/5ML IJ SOLN
INTRAMUSCULAR | Status: AC
  Filled 2017-09-06: qty 5

## 2017-09-06 MED ORDER — PHENYLEPHRINE 40 MCG/ML (10ML) SYRINGE FOR IV PUSH (FOR BLOOD PRESSURE SUPPORT)
PREFILLED_SYRINGE | INTRAVENOUS | Status: DC | PRN
  Administered 2017-09-06: 160 ug via INTRAVENOUS

## 2017-09-06 MED ORDER — SODIUM CHLORIDE 0.9 % IV SOLN: INTRAVENOUS | Status: DC | PRN

## 2017-09-06 MED ORDER — PROPOFOL 10 MG/ML IV BOLUS
INTRAVENOUS | Status: AC
  Filled 2017-09-06: qty 20

## 2017-09-06 MED ORDER — EPHEDRINE SULFATE 50 MG/ML IJ SOLN
INTRAMUSCULAR | Status: AC
  Filled 2017-09-06: qty 1

## 2017-09-06 MED ORDER — SODIUM CHLORIDE 0.9 % IJ SOLN
INTRAMUSCULAR | Status: AC
  Filled 2017-09-06: qty 10

## 2017-09-06 MED ORDER — PHENYLEPHRINE HCL 10 MG/ML IJ SOLN
INTRAVENOUS | Status: DC | PRN
  Administered 2017-09-06: 20 ug/min via INTRAVENOUS

## 2017-09-06 MED ORDER — SODIUM CHLORIDE 0.9 % IV SOLN
INTRAVENOUS | Status: DC | PRN
  Administered 2017-09-06: 09:00:00 via INTRAVENOUS

## 2017-09-06 MED ORDER — FAMOTIDINE IN NACL 20-0.9 MG/50ML-% IV SOLN
20.0000 mg | Freq: Two times a day (BID) | INTRAVENOUS | Status: DC
  Administered 2017-09-06 – 2017-09-29 (×46): 20 mg via INTRAVENOUS
  Filled 2017-09-06 (×46): qty 50

## 2017-09-06 MED ORDER — ROCURONIUM BROMIDE 10 MG/ML (PF) SYRINGE
PREFILLED_SYRINGE | INTRAVENOUS | Status: AC
  Filled 2017-09-06: qty 10

## 2017-09-06 MED ORDER — ROCURONIUM BROMIDE 10 MG/ML (PF) SYRINGE
PREFILLED_SYRINGE | INTRAVENOUS | Status: DC | PRN
  Administered 2017-09-06 (×2): 50 mg via INTRAVENOUS

## 2017-09-06 SURGICAL SUPPLY — 40 items
BLADE CLIPPER SURG (BLADE) IMPLANT
CANISTER SUCT 3000ML PPV (MISCELLANEOUS) ×5 IMPLANT
CHLORAPREP W/TINT 26ML (MISCELLANEOUS) ×5 IMPLANT
COVER SURGICAL LIGHT HANDLE (MISCELLANEOUS) ×5 IMPLANT
DRAPE LAPAROSCOPIC ABDOMINAL (DRAPES) ×5 IMPLANT
DRAPE STERI IOBAN 125X83 (DRAPES) ×5 IMPLANT
DRAPE WARM FLUID 44X44 (DRAPE) ×5 IMPLANT
DRSG OPSITE POSTOP 4X10 (GAUZE/BANDAGES/DRESSINGS) IMPLANT
DRSG OPSITE POSTOP 4X8 (GAUZE/BANDAGES/DRESSINGS) IMPLANT
ELECT BLADE 6.5 EXT (BLADE) IMPLANT
ELECT CAUTERY BLADE 6.4 (BLADE) ×5 IMPLANT
ELECT REM PT RETURN 9FT ADLT (ELECTROSURGICAL) ×5
ELECTRODE REM PT RTRN 9FT ADLT (ELECTROSURGICAL) ×3 IMPLANT
GLOVE BIO SURGEON STRL SZ8 (GLOVE) ×5 IMPLANT
GLOVE BIOGEL PI IND STRL 8 (GLOVE) ×3 IMPLANT
GLOVE BIOGEL PI INDICATOR 8 (GLOVE) ×2
GOWN STRL REUS W/ TWL LRG LVL3 (GOWN DISPOSABLE) ×3 IMPLANT
GOWN STRL REUS W/ TWL XL LVL3 (GOWN DISPOSABLE) ×3 IMPLANT
GOWN STRL REUS W/TWL LRG LVL3 (GOWN DISPOSABLE) ×2
GOWN STRL REUS W/TWL XL LVL3 (GOWN DISPOSABLE) ×2
KIT BASIN OR (CUSTOM PROCEDURE TRAY) ×5 IMPLANT
KIT TURNOVER KIT B (KITS) ×5 IMPLANT
LIGASURE IMPACT 36 18CM CVD LR (INSTRUMENTS) IMPLANT
NS IRRIG 1000ML POUR BTL (IV SOLUTION) ×10 IMPLANT
PACK GENERAL/GYN (CUSTOM PROCEDURE TRAY) ×5 IMPLANT
PAD ARMBOARD 7.5X6 YLW CONV (MISCELLANEOUS) ×5 IMPLANT
SPECIMEN JAR LARGE (MISCELLANEOUS) IMPLANT
SPONGE ABD ABTHERA ADVANCE (MISCELLANEOUS) ×5 IMPLANT
SPONGE LAP 18X18 X RAY DECT (DISPOSABLE) IMPLANT
STAPLER VISISTAT 35W (STAPLE) ×5 IMPLANT
SUCTION POOLE TIP (SUCTIONS) ×5 IMPLANT
SUT PDS AB 1 TP1 96 (SUTURE) ×10 IMPLANT
SUT SILK 2 0 SH CR/8 (SUTURE) ×5 IMPLANT
SUT SILK 2 0 TIES 10X30 (SUTURE) ×5 IMPLANT
SUT SILK 3 0 SH CR/8 (SUTURE) ×5 IMPLANT
SUT SILK 3 0 TIES 10X30 (SUTURE) ×5 IMPLANT
SUT VIC AB 3-0 SH 18 (SUTURE) ×10 IMPLANT
TOWEL OR 17X26 10 PK STRL BLUE (TOWEL DISPOSABLE) ×5 IMPLANT
TRAY FOLEY MTR SLVR 16FR STAT (SET/KITS/TRAYS/PACK) IMPLANT
YANKAUER SUCT BULB TIP NO VENT (SUCTIONS) IMPLANT

## 2017-09-06 NOTE — Progress Notes (Signed)
PULMONARY / CRITICAL CARE MEDICINE   Name: Luke Kaufman MRN: [id number] DOB: 15-Oct-1950    ADMISSION DATE:  09/03/2017 CONSULTATION DATE:  5/16  REFERRING MD:  Oneida Alar   CHIEF COMPLAINT:  Post op Vent management   HISTORY OF PRESENT ILLNESS:   67 year old male active smoker (2 PPD) with history of hypertension, CAD, MI, stroke, PAD who presented 5/15 to the ED with complaints of severe epigastric and periumbilical pain.  ER work-up revealed lactate 3.5, fever.  CT abdomen was concerning for portal venous gas.  He was seen by vascular surgery with concern for mesenteric ischemia and underwent mesenteric angiography which showed ischemic right colon.  He then underwent exploratory lap by general surgery with right hemicolectomy.  Postop abd remains open with VAC, he remains on ventilator, transferred to ICU and PCCM consulted for ventilator and medical management.  5/18 The patient is heavily sedated on the ventilaltor. He remains on high doses of pressors. Has put out about 2 liters in the past 24 hours through his wound vac Labs are pending. CXR shows moderate bilateral effusions.     VITAL SIGNS: BP (!) 132/40   Pulse 98   Temp 99.4 F (37.4 C) (Axillary)   Resp 20   Ht 5\' 7"  (1.702 m)   Wt 251 lb 15.8 oz (114.3 kg)   SpO2 95%   BMI 39.47 kg/m   HEMODYNAMICS: CVP:  [6 mmHg-12 mmHg] 11 mmHg  VENTILATOR SETTINGS: Vent Mode: PRVC FiO2 (%):  [40 %] 40 % Set Rate:  [20 bmp] 20 bmp Vt Set:  [540 mL] 540 mL PEEP:  [5 cmH20-8 cmH20] 5 cmH20 Plateau Pressure:  [18 cmH20-21 cmH20] 21 cmH20  INTAKE / OUTPUT: I/O last 3 completed shifts: In: 10921.6 [I.V.:7695; IV Piggyback:3226.7] Out: 3480 [Urine:705; Emesis/NG output:325; Drains:2450]  PHYSICAL EXAMINATION: General:  Acutelyhronically ill appearing male, hemodynamically labile Neuro:  Sedated post op, RASS -3,  HEENT: NCAT,  Mm moist, ETT  Cardiovascular: S1, S2, tachy with PAC's,  Lungs:  resps even non labored on  vent, coarse throughout , few crackles per bases Abdomen:  Open, large wound VAC in place with bowel edema and  protrusion noted around VAC sponge Musculoskeletal:  Warm and dry, scant BLE edema , no mottling noted  LABS:  BMET Recent Labs  Lab 09/04/17 1036 09/05/17 0750 09/05/17 1543  NA 139 135 136  K 4.3 4.2 4.0  CL 108 109 111  CO2 21* 17* 19*  BUN 16 27* 28*  CREATININE 1.50* 2.32* 2.29*  GLUCOSE 141* 141* 167*    Electrolytes Recent Labs  Lab 09/04/17 1036 09/05/17 0750 09/05/17 1543  CALCIUM 7.8* 6.8* 6.2*  MG 1.8 1.4*  --     CBC Recent Labs  Lab 09/04/17 1036 09/04/17 2238 09/05/17 0750  WBC 13.9* 10.1 11.0*  HGB 13.0 13.7 12.8*  HCT 38.9* 40.8 37.7*  PLT 178 192 162    Coag's Recent Labs  Lab 09/04/17 1036  APTT 56*  INR 1.60    Sepsis Markers Recent Labs  Lab 09/04/17 0126 09/04/17 1624 09/05/17 0750 09/05/17 1021  LATICACIDVEN 3.20* 1.7  --  1.4  PROCALCITON  --   --  3.22  --     ABG Recent Labs  Lab 09/05/17 1618 09/05/17 1656 09/05/17 2041  PHART 7.254* 7.298* 7.303*  PCO2ART 39.8 36.2 36.9  PO2ART 78.0* 75.0* 87.0    Liver Enzymes Recent Labs  Lab 09/03/17 1644 09/05/17 0750  AST 25 376*  ALT 16* 313*  ALKPHOS 104 47  BILITOT 0.6 0.8  ALBUMIN 4.0 2.4*    Cardiac Enzymes Recent Labs  Lab 09/05/17 1352 09/05/17 2035 09/06/17 0148  TROPONINI 0.36* 0.27* 0.29*    Glucose Recent Labs  Lab 09/05/17 0742 09/05/17 1152 09/05/17 1557 09/05/17 1938 09/05/17 2311 09/06/17 0316  GLUCAP 137* 135* 151* 178* 163* 163*    Imaging No results found.   STUDIES:  CT abd/pelvis 5/15>>> 1. Portal venous gas in the caudate and left hepatic lobe, uncertain source or etiology. No areas of abnormal bowel wall thickening or intramural air are visualized. Suggest correlation with lactic acid levels. 2. 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 Atrophic left kidney  CULTURES: BC x 2 5/17  >>> still negative  ANTIBIOTICS: Zosyn 5/16>>>  SIGNIFICANT EVENTS: OR 5/16>>>  LINES/TUBES: ETT 5/16>>>ischemic R colon, exp lap, R hemicolectomy    DISCUSSION: 67 year old male with ischemic bowel, status post exploratory lap 5/16 with postop vent needs.  ASSESSMENT / PLAN:  PULMONARY Acute respiratory failure-postop exploratory laparotomy for ischemic bowel Tobacco abuse-2 PPD Patient has increasing infusions. Not surprising given his surgery, decreased albumin and vigorous resuscitation efforts. It will likely be several days more on the ventialtor    CARDIOVASCULAR History of HTN History CAD, MI PAD Hypotension Tachy Troponin bump>> ? Ischemic demand Large VAC dressing output ( 2500 cc last 24)  ( 2/2 third spaced bowel edema) CVP is 11 + 10 L per flowsheet Lactate had returned to [facility], but will re-check as has become more pressor dependent 5/18 Patient is in a sinus tach He remains on 35 mcg/min Levophed which has been the case.   P:  Telemetry monitoring Pressors to maintain MAP > 65 Fluid replacement for wound loss with LR for now CVP q 4 Trend  lactate, pct  Trend troponin until clear Check co-ox now EKG now and PRN Will order Echo   RENAL Lactic acidosis  Non-gap acidosis ( ? 2/2 saline repletion) AKI - mild.  Baseline Scr ~ 1.2.  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 Peptic ulcer disease Bowel perfusion and distension noted, Bowel is pink NG output about 300 since OR 5/16 Large volume output from VAC P:   N.P.O PPI Per surgery>> plan to take to OR 5/18 ( close or ileostomy) Antibiotics as above   HEMATOLOGIC No active issue  P:  F/u CBC now and in am Transfuse for HGB < 7  SCD's  Monitor for  active bleeding  INFECTIOUS Mesenteric ischemia  Open abd  P:   Zosyn per pharmacy  Trend pct  Follow Micro Pan culture  Blood cultures still negative as of 5/18  ENDOCRINE Hyperglycemia  P:   hgbA1c  CBG Q 4 SSI   NEUROLOGIC Sedation needs (ooen abd) Per surgery, keep well sedated  P:   RASS goal: -3 Propofol, PRN fentanyl  No WUA prior to OR 5/18   In summary we have an older man who presented with signidfcant bowel ischemia s/p right hemicolectomy on the ventialtor on high pressors. Outlook remains guarded   FAMILY  - Updates: no family at bedside 5/17,   - Inter-disciplinary family meet or Palliative Care meeting due by:  Day 7   Magdalen Spatz, AGACNP-BC [facility]. Pager: [phone].  09/06/2017  7:32 AM

## 2017-09-06 NOTE — Progress Notes (Signed)
Subjective: Interval History: Remains critically ill on vent.  For return to the operating room today by general surgery for second look and washout.  Blood pressure maintained with pressors but no increase required.  Objective: Vital signs in last 24 hours: Temp:  [98.4 F (36.9 C)-99.5 F (37.5 C)] 99.4 F (37.4 C) (05/18 0400) Pulse Rate:  [77-125] 98 (05/18 0700) Resp:  [18-23] 20 (05/18 0737) BP: (79-144)/(40-73) 144/45 (05/18 0737) SpO2:  [92 %-95 %] 95 % (05/18 0700) Arterial Line BP: (104-165)/(39-67) 134/44 (05/18 0700) FiO2 (%):  [40 %] 40 % (05/18 0738)  Intake/Output from previous day: 05/17 0701 - 05/18 0700 In: 10899.6 [I.V.:7723; IV Piggyback:3176.7] Out: 2955 [Urine:505; Emesis/NG output:450; Drains:2000] Intake/Output this shift: No intake/output data recorded.  Abdomen soft.  VAC in place.  Feet cool but perfused.  Lab Results: Recent Labs    09/05/17 0750 09/06/17 0630  WBC 11.0* 10.4  HGB 12.8* 11.2*  HCT 37.7* 32.8*  PLT 162 147*   BMET Recent Labs    09/05/17 0750 09/05/17 1543  NA 135 136  K 4.2 4.0  CL 109 111  CO2 17* 19*  GLUCOSE 141* 167*  BUN 27* 28*  CREATININE 2.32* 2.29*  CALCIUM 6.8* 6.2*    Studies/Results: Ct Abdomen Pelvis W Contrast  Result Date: 09/03/2017 CLINICAL DATA:  Abdominal pain with nausea history of ulcer EXAM: CT ABDOMEN AND PELVIS WITH CONTRAST TECHNIQUE: Multidetector CT imaging of the abdomen and pelvis was performed using the standard protocol following bolus administration of intravenous contrast. CONTRAST:  100 cc Isovue-300 intravenous COMPARISON:  Renal ultrasound 05/19/2013 FINDINGS: Lower chest: Lung bases demonstrate patchy dependent atelectasis on the right. No pleural effusion. Heart size upper normal. Coronary vascular calcification. Small hiatal hernia. Hepatobiliary: Peripherally distributed gas in the caudate and left hepatic lobe consistent with portal venous gas. No calcified gallstones. No biliary  dilatation Pancreas: Fatty atrophy with no inflammation Spleen: Normal in size without focal abnormality. Adrenals/Urinary Tract: Adrenal glands are within normal limits. Atrophic left kidney. No hydronephrosis. Bladder is normal Stomach/Bowel: Stomach is nonenlarged. No dilated small bowel. No colon wall thickening. Negative appendix. Sigmoid colon diverticular disease without acute inflammation Vascular/Lymphatic: Moderate severe aortic atherosclerosis. No aneurysm. No significantly enlarged lymph nodes. Reproductive: Enlarged prostate Other: Negative for free air or free fluid. Musculoskeletal: Degenerative changes of the spine. No acute or suspicious abnormality IMPRESSION: 1. Portal venous gas in the caudate and left hepatic lobe, uncertain source or etiology. No areas of abnormal bowel wall thickening or intramural air are visualized. Suggest correlation with lactic acid levels. 2. 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 Atrophic left kidney Electronically Signed   By: Donavan Foil M.D.   On: 09/03/2017 21:36   Dg Chest Port 1 View  Result Date: 09/06/2017 CLINICAL DATA:  Respiratory failure. EXAM: PORTABLE CHEST 1 VIEW COMPARISON:  09/05/2017. FINDINGS: Endotracheal tube terminates 3.1 cm above the carina. Right sided central line tip projects over the SVC. Nasogastric tube is followed into the stomach with the tip projecting beyond the inferior margin of the image. Heart size within normal limits. Thoracic aorta is calcified. Lungs are low in volume with bibasilar airspace opacification and bilateral pleural effusions. IMPRESSION: 1. Bibasilar airspace opacification may be due to atelectasis and/or pneumonia. 2. Bilateral pleural effusions. Electronically Signed   By: Lorin Picket M.D.   On: 09/06/2017 07:47   Dg Chest Port 1 View  Result Date: 09/05/2017 CLINICAL DATA:  ETT placement EXAM: PORTABLE CHEST 1 VIEW COMPARISON:  Sep 04, 2017 FINDINGS: The ETT is in good  position. The NG tube terminates below today's film. A right central line is stable terminating in the SVC. The cardiomediastinal silhouette is change unchanged. Left lung is clear. Opacity in the medial right lung base is likely vascular crowding. No other interval changes. IMPRESSION: 1. Support apparatus as above. 2. Mild opacity in the medial right lung base is favored to represent vascular crowding. Recommend attention on follow-up. No other abnormalities. Electronically Signed   By: Dorise Bullion III M.D   On: 09/05/2017 08:28   Dg Chest Port 1 View  Result Date: 09/04/2017 CLINICAL DATA:  Hypoxia EXAM: PORTABLE CHEST 1 VIEW COMPARISON:  November 23, 2015 FINDINGS: Endotracheal tube tip is at the carina. Nasogastric tube tip and side port are below the diaphragm. Central catheter tip is in the superior vena cava. No pneumothorax. There is atelectatic change in the right mid lung. There is a small left pleural effusion. Lungs elsewhere are clear. Heart is mildly enlarged with pulmonary vascularity normal. No adenopathy. There is aortic atherosclerosis. No bone lesions. IMPRESSION: Tube and catheter positions as described without pneumothorax. Note that the endotracheal tube tip is at the carina. Suggest withdrawing endotracheal tube approximately 3 cm. Right midlung atelectasis. Small left pleural effusion. Lungs elsewhere clear. Stable cardiac prominence. These results will be called to the ordering clinician or representative by the Radiologist Assistant, and communication documented in the PACS or zVision Dashboard. Electronically Signed   By: Lowella Grip III M.D.   On: 09/04/2017 10:51   Dg Abd Portable 1v  Result Date: 09/04/2017 CLINICAL DATA:  Status post colon resection and laparotomy. EXAM: PORTABLE ABDOMEN - 1 VIEW COMPARISON:  Radiographs of same day. FINDINGS: The bowel gas pattern is normal. Distal tip of nasogastric tube is seen in expected position of proximal stomach. No  radio-opaque calculi or other significant radiographic abnormality are seen. IMPRESSION: Distal tip of nasogastric tube is seen in expected position of proximal stomach. No evidence of bowel obstruction or ileus. Electronically Signed   By: Marijo Conception, M.D.   On: 09/04/2017 11:41   Dg Or Local Abdomen  Result Date: 09/04/2017 CLINICAL DATA:  Instrument count in the operating room EXAM: OR LOCAL ABDOMEN COMPARISON:  CT abdomen and pelvis of 09/03/2017 FINDINGS: Portable supine views of the abdomen show no opaque foreign body. Bowel gas is present which may indicate mild ileus. Single small amount of contrast is noted in the urinary bladder with Foley catheter present. IMPRESSION: No opaque foreign body is seen. I called this report to Otila Kluver in the operating room at the time of interpretation. Electronically Signed   By: Ivar Drape M.D.   On: 09/04/2017 09:52   Anti-infectives: Anti-infectives (From admission, onward)   Start     Dose/Rate Route Frequency Ordered Stop   09/04/17 1400  piperacillin-tazobactam (ZOSYN) IVPB 3.375 g     3.375 g 12.5 mL/hr over 240 Minutes Intravenous Every 8 hours 09/04/17 1157     09/04/17 1100  ceFAZolin (ANCEF) IVPB 2g/100 mL premix  Status:  Discontinued     2 g 200 mL/hr over 30 Minutes Intravenous Every 8 hours 09/04/17 1035 09/04/17 1157   09/04/17 0630  piperacillin-tazobactam (ZOSYN) IVPB 3.375 g  Status:  Discontinued     3.375 g 100 mL/hr over 30 Minutes Intravenous  Once 09/04/17 0617 09/04/17 0626   09/04/17 0619  piperacillin-tazobactam (ZOSYN) IVPB 3.375 g     3.375 g 100 mL/hr over 30  Minutes Intravenous 60 min pre-op 09/04/17 0620 09/04/17 0717   09/04/17 0030  piperacillin-tazobactam (ZOSYN) IVPB 3.375 g     3.375 g 100 mL/hr over 30 Minutes Intravenous  Once 09/04/17 0024 09/04/17 0144      Assessment/Plan: s/p Procedure(s) with comments: Non Selective MESENTERIC ANGIOGRAM, (N/A) EXPLORATORY LAPAROTOMY Right Colon Resection  (Right) URETHRA DILATATION and insertion of foley catheter (N/A) - by Dr Matilde Sprang APPLICATION OF WOUND VAC and Exploration of Abdomen. (N/A) Aorta to Superior Mesinteric aorta bypass ultrason Left common Femoral/ Continue to support per critical care medicine.  Second look today with general surgery   LOS: 2 days   Luke Kaufman 09/06/2017, 7:52 AM

## 2017-09-06 NOTE — Transfer of Care (Signed)
Immediate Anesthesia Transfer of Care Note  Patient: Gabino Hagin  Procedure(s) Performed: EXPLORATORY LAPAROTOMY (N/A Abdomen) POSSIBLE ILEOSTOMY (Right Abdomen) APPLICATION OF WOUND VAC (N/A Abdomen)  Patient Location: ICU  Anesthesia Type:General  Level of Consciousness: Patient remains intubated per anesthesia plan  Airway & Oxygen Therapy: Patient remains intubated per anesthesia plan and Patient placed on Ventilator (see vital sign flow sheet for setting)  Post-op Assessment: Report given to RN  Post vital signs: Reviewed and stable  Last Vitals:  Vitals Value Taken Time  BP    Temp    Pulse 107 09/06/2017 10:03 AM  Resp 20 09/06/2017 10:03 AM  SpO2 92 % 09/06/2017 10:03 AM  Vitals shown include unvalidated device data.  Last Pain:  Vitals:   09/06/17 0759  TempSrc: Oral  PainSc:          Complications: No apparent anesthesia complications

## 2017-09-06 NOTE — Procedures (Signed)
The patient lost his arterial line in theright radial positin. There is not a great pulse in the right radial position. I attempted to place a right brachaial line because iof thenhis rent abdominal surgery, preference for not using the femoral in this patient. I was unable to find the artery although there was an excellent doppler signal. I tan decided to try the right femoral approach. The patient is on high ddose steroids and it isimportant ot have accurate Bps. I cleansed and draped the area. I was able to paplpate thepulse. I awas able to cannulate th eartery and place a 4.5 cm cather using a guidworse approach. The line was suture inplace. There was godd wavefrom following placement.

## 2017-09-06 NOTE — Op Note (Signed)
09/06/2017  9:48 AM  PATIENT:  Luke Kaufman  67 y.o. 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  Significant edema.  Unable to close.  Procedure in detail: Mr. Belvedere returns to the operating room for reexploration and possible ileostomy.  He remains critical ill on pressor support.  He is on IV antibiotics.  He was brought directly from the intensive care unit to the operating room on the ventilator.  Informed consent was obtained from his sister.  His outer VAC dressing was removed.  His abdomen was prepped and draped in sterile fashion.  We did a timeout procedure.  The inner VAC drape was carefully removed.  The abdomen was copiously irrigated.  There was no significant contamination.  The bowel loops were quite edematous.  The small bowel was freed up from some filmy adhesions and I inspected it from the ligament of Treitz down to the stapled off and of the.  It all looked viable.  The remaining proximal transverse colon and the rest of the colon appeared viable.  Decision was made to go ahead and bring up an ileostomy.  The bowel was far too edematous to be able to close his abdomen.  Circular incision was made in the right lower quadrant and this incision was carried down to the fascial layer.  A cruciate incision was made in the fascia and opening was made to allow the end of the ileum to pass  out.  It was brought out and I wanted to evaluate it further prior to placing the midline back.  I went ahead and protected the midline wound I then matured the ileostomy with 3-0 Vicryls.  There was some mucosal slough distally but most of this wiped away and the ileostomy appeared viable and patent.  Next, we changed our gloves and I replaced an open abdomen VAC device in standard fashion.  I adjusted the inner drape to allow space for the ileostomy and it was packed around all of the bowel well.  2 blue sponges were placed followed by VAC drape and a small piece of Ioban.  Excellent seal was achieved.  An ostomy appliance was placed.  All counts were correct.  He tolerated the procedure and was taken directly back to the intensive care unit in critical condition on the ventilator.  We will plan reexploration for possible closure in 48 hours.  No apparent complications. PATIENT DISPOSITION:  ICU - intubated and critically ill.   Delay start of Pharmacological VTE agent (>24hrs) due to surgical blood loss or risk of bleeding:  no  Georganna Skeans, MD, MPH, FACS Pager: [phone]  5/18/20199:48 AM

## 2017-09-06 NOTE — Progress Notes (Signed)
2 Days Post-Op   Subjective/Chief Complaint: On vent   Objective: Vital signs in last 24 hours: Temp:  [98.4 F (36.9 C)-99.5 F (37.5 C)] 99.4 F (37.4 C) (05/18 0400) Pulse Rate:  [77-125] 98 (05/18 0700) Resp:  [18-23] 20 (05/18 0737) BP: (79-144)/(40-73) 144/45 (05/18 0737) SpO2:  [92 %-95 %] 95 % (05/18 0700) Arterial Line BP: (104-165)/(39-67) 134/44 (05/18 0700) FiO2 (%):  [40 %] 40 % (05/18 0738)    Intake/Output from previous day: 05/17 0701 - 05/18 0700 In: 10899.6 [I.V.:7723; IV Piggyback:3176.7] Out: 2955 [Urine:505; Emesis/NG output:450; Drains:2000] Intake/Output this shift: No intake/output data recorded.  General appearance: no distress Resp: rhonchi and rales Cardio: regular rate and rhythm GI: open abdomen VAC with bowel seen at edges, edematous  Lab Results:  Recent Labs    09/05/17 0750 09/06/17 0630  WBC 11.0* 10.4  HGB 12.8* 11.2*  HCT 37.7* 32.8*  PLT 162 147*   BMET Recent Labs    09/05/17 0750 09/05/17 1543  NA 135 136  K 4.2 4.0  CL 109 111  CO2 17* 19*  GLUCOSE 141* 167*  BUN 27* 28*  CREATININE 2.32* 2.29*  CALCIUM 6.8* 6.2*   PT/INR Recent Labs    09/04/17 1036  LABPROT 18.9*  INR 1.60   ABG Recent Labs    09/05/17 2041 09/06/17 0747  PHART 7.303* 7.401  HCO3 18.2* 23.9    Studies/Results: Dg Chest Port 1 View  Result Date: 09/06/2017 CLINICAL DATA:  Respiratory failure. EXAM: PORTABLE CHEST 1 VIEW COMPARISON:  09/05/2017. FINDINGS: Endotracheal tube terminates 3.1 cm above the carina. Right sided central line tip projects over the SVC. Nasogastric tube is followed into the stomach with the tip projecting beyond the inferior margin of the image. Heart size within normal limits. Thoracic aorta is calcified. Lungs are low in volume with bibasilar airspace opacification and bilateral pleural effusions. IMPRESSION: 1. Bibasilar airspace opacification may be due to atelectasis and/or pneumonia. 2. Bilateral pleural  effusions. Electronically Signed   By: Lorin Picket M.D.   On: 09/06/2017 07:47   Dg Chest Port 1 View  Result Date: 09/05/2017 CLINICAL DATA:  ETT placement EXAM: PORTABLE CHEST 1 VIEW COMPARISON:  Sep 04, 2017 FINDINGS: The ETT is in good position. The NG tube terminates below today's film. A right central line is stable terminating in the SVC. The cardiomediastinal silhouette is change unchanged. Left lung is clear. Opacity in the medial right lung base is likely vascular crowding. No other interval changes. IMPRESSION: 1. Support apparatus as above. 2. Mild opacity in the medial right lung base is favored to represent vascular crowding. Recommend attention on follow-up. No other abnormalities. Electronically Signed   By: Dorise Bullion III M.D   On: 09/05/2017 08:28   Dg Chest Port 1 View  Result Date: 09/04/2017 CLINICAL DATA:  Hypoxia EXAM: PORTABLE CHEST 1 VIEW COMPARISON:  November 23, 2015 FINDINGS: Endotracheal tube tip is at the carina. Nasogastric tube tip and side port are below the diaphragm. Central catheter tip is in the superior vena cava. No pneumothorax. There is atelectatic change in the right mid lung. There is a small left pleural effusion. Lungs elsewhere are clear. Heart is mildly enlarged with pulmonary vascularity normal. No adenopathy. There is aortic atherosclerosis. No bone lesions. IMPRESSION: Tube and catheter positions as described without pneumothorax. Note that the endotracheal tube tip is at the carina. Suggest withdrawing endotracheal tube approximately 3 cm. Right midlung atelectasis. Small left pleural effusion. Lungs elsewhere clear. Stable  cardiac prominence. These results will be called to the ordering clinician or representative by the Radiologist Assistant, and communication documented in the PACS or zVision Dashboard. Electronically Signed   By: Lowella Grip III M.D.   On: 09/04/2017 10:51   Dg Abd Portable 1v  Result Date: 09/04/2017 CLINICAL DATA:   Status post colon resection and laparotomy. EXAM: PORTABLE ABDOMEN - 1 VIEW COMPARISON:  Radiographs of same day. FINDINGS: The bowel gas pattern is normal. Distal tip of nasogastric tube is seen in expected position of proximal stomach. No radio-opaque calculi or other significant radiographic abnormality are seen. IMPRESSION: Distal tip of nasogastric tube is seen in expected position of proximal stomach. No evidence of bowel obstruction or ileus. Electronically Signed   By: Marijo Conception, M.D.   On: 09/04/2017 11:41   Dg Or Local Abdomen  Result Date: 09/04/2017 CLINICAL DATA:  Instrument count in the operating room EXAM: OR LOCAL ABDOMEN COMPARISON:  CT abdomen and pelvis of 09/03/2017 FINDINGS: Portable supine views of the abdomen show no opaque foreign body. Bowel gas is present which may indicate mild ileus. Single small amount of contrast is noted in the urinary bladder with Foley catheter present. IMPRESSION: No opaque foreign body is seen. I called this report to Otila Kluver in the operating room at the time of interpretation. Electronically Signed   By: Ivar Drape M.D.   On: 09/04/2017 09:52    Anti-infectives: Anti-infectives (From admission, onward)   Start     Dose/Rate Route Frequency Ordered Stop   09/04/17 1400  piperacillin-tazobactam (ZOSYN) IVPB 3.375 g     3.375 g 12.5 mL/hr over 240 Minutes Intravenous Every 8 hours 09/04/17 1157     09/04/17 1100  ceFAZolin (ANCEF) IVPB 2g/100 mL premix  Status:  Discontinued     2 g 200 mL/hr over 30 Minutes Intravenous Every 8 hours 09/04/17 1035 09/04/17 1157   09/04/17 0630  piperacillin-tazobactam (ZOSYN) IVPB 3.375 g  Status:  Discontinued     3.375 g 100 mL/hr over 30 Minutes Intravenous  Once 09/04/17 0617 09/04/17 0626   09/04/17 0619  piperacillin-tazobactam (ZOSYN) IVPB 3.375 g     3.375 g 100 mL/hr over 30 Minutes Intravenous 60 min pre-op 09/04/17 0620 09/04/17 0717   09/04/17 0030  piperacillin-tazobactam (ZOSYN) IVPB 3.375 g      3.375 g 100 mL/hr over 30 Minutes Intravenous  Once 09/04/17 0024 09/04/17 0144      Assessment/Plan: s/p Procedure(s) with comments: Non Selective MESENTERIC ANGIOGRAM, (N/A) EXPLORATORY LAPAROTOMY Right Colon Resection (Right) URETHRA DILATATION and insertion of foley catheter (N/A) - by Dr Matilde Sprang APPLICATION OF WOUND VAC and Exploration of Abdomen. (N/A) Aorta to Superior Mesinteric aorta bypass ultrason Left common Femoral/  Mesenteric ischemia POD 2, s/p right colectomy, discontinuity, open abdominal VAC applied by Dr. Dorris Fetch Byerly/Dr. Annye English POD 2, s/p aortogram with aorto to superior mesenteric artery bypass (8 mm Dacron) by Dr. Ruta Hinds -patient on pressor support this morning still with hypotension. -low UOP -on zosyn -back to OR this AM for ex lap, washout, possible ileostomy. Doubt will be able to close yet. I spoke with his sister, Bethena Roys,  who has consented for the procedure. I again discussed the risks and benefits.  VDRF -per CCM  H/O HTN/CAD/MI  Decreased UOP - per primary, CCM.    Hypotension -per CCM, on pressors   FEN - NPO, NGT VTE - SCDs/no chemical prophylaxis currently ID - zosyn  Dispo -   LOS: 2 days  Zenovia Jarred 09/06/2017

## 2017-09-06 NOTE — OR Nursing (Signed)
Critical  lab value for calcium 6.4 informed surgeon who informed anesthesia

## 2017-09-06 NOTE — Progress Notes (Addendum)
Arterial line positional and very difficult to draw back blood samples.  Waveform underdampened with no oscillations after square wave form test.  RN assessed the line, rezero'd, and flushed with no improvement.  RN spoke with respiratory therapist requesting assistance in assessing the arterial line as patient is on 40 mcg of Levophed and arterial line is needed for accurate BP monitoring.   RT tried to correct arterial line but was not able to and removed arterial line.  Paged MD Early, MD advised okay for RT to place new arterial line.  RN discussed patient's BP with MD Early, last cuff reading 76/62 (68) and patient on 40 mcg Levophed.  MD Early advised after arterial line placed if arterial line correlates and patient hypotensive to reach out to CCM for orders to address hypotension as they are managing IV medications.   RT unable to place arterial line in right radial and patient's left arm restricted.  Per notes patient has arterial disease and BP reads lower in one arm than the other, presumably the left per notes.  RN was taking manual BP in left arm due to right upper arm IV.  Moved BP cuff to right and left leg.   Called and spoke with MD Debbora Dus, MD advised will come to unit this afternoon and assess patient for arterial line placement.

## 2017-09-06 NOTE — Anesthesia Preprocedure Evaluation (Signed)
Anesthesia Evaluation  Patient identified by MRN, date of birth, ID band Patient unresponsive    Reviewed: Patient's Chart, lab work & pertinent test results, Unable to perform ROS - Chart review only  History of Anesthesia Complications Negative for: history of anesthetic complications  Airway Mallampati: Intubated       Dental  (+) Missing, Poor Dentition, Chipped   Pulmonary Current Smoker,    breath sounds clear to auscultation       Cardiovascular hypertension, Pt. on medications + CAD, + Past MI and + Peripheral Vascular Disease   Rhythm:Regular Rate:Tachycardia     Neuro/Psych PSYCHIATRIC DISORDERS Depression CVA    GI/Hepatic PUD,   Endo/Other  Morbid obesity  Renal/GU Renal Insufficiency and ARFRenal disease     Musculoskeletal negative musculoskeletal ROS (+)   Abdominal   Peds  Hematology  (+) anemia ,   Anesthesia Other Findings   Reproductive/Obstetrics                             Anesthesia Physical Anesthesia Plan  ASA: III  Anesthesia Plan: General   Post-op Pain Management:    Induction:   PONV Risk Score and Plan: 1 and Treatment may vary due to age or medical condition  Airway Management Planned: Oral ETT  Additional Equipment: Arterial line and CVP  Intra-op Plan:   Post-operative Plan: Post-operative intubation/ventilation  Informed Consent:   Plan Discussed with: CRNA and Surgeon  Anesthesia Plan Comments:         Anesthesia Quick Evaluation

## 2017-09-06 NOTE — Anesthesia Postprocedure Evaluation (Signed)
Anesthesia Post Note  Patient: Luke Kaufman  Procedure(s) Performed: EXPLORATORY LAPAROTOMY (N/A Abdomen) POSSIBLE ILEOSTOMY (Right Abdomen) APPLICATION OF WOUND VAC (N/A Abdomen)     Patient location during evaluation: SICU Anesthesia Type: General Level of consciousness: sedated Pain management: pain level controlled Vital Signs Assessment: post-procedure vital signs reviewed and stable Respiratory status: patient remains intubated per anesthesia plan Cardiovascular status: stable Postop Assessment: no apparent nausea or vomiting Anesthetic complications: no    Last Vitals:  Vitals:   09/06/17 1333 09/06/17 1345  BP: (!) 114/59 (!) 117/57  Pulse: 95 92  Resp: 20 20  Temp:    SpO2: 96% 96%    Last Pain:  Vitals:   09/06/17 1200  TempSrc: Oral  PainSc:                  Luke Kaufman

## 2017-09-07 ENCOUNTER — Inpatient Hospital Stay (HOSPITAL_COMMUNITY): Payer: PPO

## 2017-09-07 LAB — CBC
HEMATOCRIT: 30.9 % — AB (ref 39.0–52.0)
Hemoglobin: 10.6 g/dL — ABNORMAL LOW (ref 13.0–17.0)
MCH: 30.5 pg (ref 26.0–34.0)
MCHC: 34.3 g/dL (ref 30.0–36.0)
MCV: 88.8 fL (ref 78.0–100.0)
PLATELETS: 156 10*3/uL (ref 150–400)
RBC: 3.48 MIL/uL — ABNORMAL LOW (ref 4.22–5.81)
RDW: 13.5 % (ref 11.5–15.5)
WBC: 12.2 10*3/uL — ABNORMAL HIGH (ref 4.0–10.5)

## 2017-09-07 LAB — CORTISOL: CORTISOL PLASMA: 14.8 ug/dL

## 2017-09-07 LAB — POCT I-STAT 3, VENOUS BLOOD GAS (G3P V)
Acid-Base Excess: 5 mmol/L — ABNORMAL HIGH (ref 0.0–2.0)
Bicarbonate: 30.5 mmol/L — ABNORMAL HIGH (ref 20.0–28.0)
O2 SAT: 68 %
PCO2 VEN: 52.5 mmHg (ref 44.0–60.0)
PH VEN: 7.374 (ref 7.250–7.430)
PO2 VEN: 38 mmHg (ref 32.0–45.0)
Patient temperature: 99.4
TCO2: 32 mmol/L (ref 22–32)

## 2017-09-07 LAB — POCT I-STAT 3, ART BLOOD GAS (G3+)
ACID-BASE EXCESS: 5 mmol/L — AB (ref 0.0–2.0)
Acid-Base Excess: 7 mmol/L — ABNORMAL HIGH (ref 0.0–2.0)
BICARBONATE: 31.7 mmol/L — AB (ref 20.0–28.0)
BICARBONATE: 30.8 mmol/L — AB (ref 20.0–28.0)
O2 SAT: 94 %
O2 Saturation: 93 %
PCO2 ART: 47.6 mmHg (ref 32.0–48.0)
PH ART: 7.433 (ref 7.350–7.450)
PH ART: 7.434 (ref 7.350–7.450)
PO2 ART: 70 mmHg — AB (ref 83.0–108.0)
PO2 ART: 66 mmHg — AB (ref 83.0–108.0)
Patient temperature: 99.4
Patient temperature: 99
TCO2: 33 mmol/L — AB (ref 22–32)
TCO2: 32 mmol/L (ref 22–32)
pCO2 arterial: 46.1 mmHg (ref 32.0–48.0)

## 2017-09-07 LAB — CULTURE, RESPIRATORY W GRAM STAIN: Gram Stain: NONE SEEN

## 2017-09-07 LAB — GLUCOSE, CAPILLARY
GLUCOSE-CAPILLARY: 152 mg/dL — AB (ref 65–99)
GLUCOSE-CAPILLARY: 158 mg/dL — AB (ref 65–99)
GLUCOSE-CAPILLARY: 162 mg/dL — AB (ref 65–99)
GLUCOSE-CAPILLARY: 138 mg/dL — AB (ref 65–99)
Glucose-Capillary: 157 mg/dL — ABNORMAL HIGH (ref 65–99)
Glucose-Capillary: 159 mg/dL — ABNORMAL HIGH (ref 65–99)
Glucose-Capillary: 161 mg/dL — ABNORMAL HIGH (ref 65–99)

## 2017-09-07 LAB — BASIC METABOLIC PANEL
ANION GAP: 8 (ref 5–15)
BUN: 14 mg/dL (ref 6–20)
CHLORIDE: 98 mmol/L — AB (ref 101–111)
CO2: 31 mmol/L (ref 22–32)
Calcium: 6.3 mg/dL — CL (ref 8.9–10.3)
Creatinine, Ser: 1.57 mg/dL — ABNORMAL HIGH (ref 0.61–1.24)
GFR, EST AFRICAN AMERICAN: 51 mL/min — AB (ref >60)
GFR, EST NON AFRICAN AMERICAN: 44 mL/min — AB (ref >60)
Glucose, Bld: 151 mg/dL — ABNORMAL HIGH (ref 65–99)
POTASSIUM: 3.1 mmol/L — AB (ref 3.5–5.1)
SODIUM: 137 mmol/L (ref 135–145)

## 2017-09-07 LAB — MAGNESIUM: Magnesium: 1.6 mg/dL — ABNORMAL LOW (ref 1.7–2.4)

## 2017-09-07 LAB — PHOSPHORUS: Phosphorus: 1.2 mg/dL — ABNORMAL LOW (ref 2.5–4.6)

## 2017-09-07 LAB — CULTURE, RESPIRATORY

## 2017-09-07 MED ORDER — SODIUM CHLORIDE 0.9 % IV SOLN
1.0000 g | Freq: Once | INTRAVENOUS | Status: AC
  Administered 2017-09-07: 1 g via INTRAVENOUS
  Filled 2017-09-07: qty 10

## 2017-09-07 MED ORDER — VASOPRESSIN 20 UNIT/ML IV SOLN
0.0400 [IU]/min | INTRAVENOUS | Status: DC
  Administered 2017-09-07 – 2017-09-08 (×2): 0.04 [IU]/min via INTRAVENOUS
  Filled 2017-09-07 (×4): qty 2

## 2017-09-07 MED ORDER — SODIUM CHLORIDE 0.9 % IV BOLUS
250.0000 mL | Freq: Once | INTRAVENOUS | Status: AC
  Administered 2017-09-07: 250 mL via INTRAVENOUS

## 2017-09-07 MED ORDER — DEXTROSE 5 % IV SOLN
20.0000 mmol | Freq: Once | INTRAVENOUS | Status: AC
Start: 2017-09-07 — End: 2017-09-07
  Administered 2017-09-07: 20 mmol via INTRAVENOUS
  Filled 2017-09-07: qty 6.67

## 2017-09-07 MED ORDER — VASOPRESSIN 20 UNIT/ML IV SOLN
0.0300 [IU]/min | INTRAVENOUS | Status: DC
  Filled 2017-09-07: qty 2

## 2017-09-07 MED ORDER — MAGNESIUM SULFATE 2 GM/50ML IV SOLN
2.0000 g | Freq: Once | INTRAVENOUS | Status: AC
Start: 2017-09-07 — End: 2017-09-07
  Administered 2017-09-07: 2 g via INTRAVENOUS
  Filled 2017-09-07: qty 50

## 2017-09-07 MED ORDER — SODIUM CHLORIDE 0.9 % IV SOLN
2.0000 g | INTRAVENOUS | Status: AC
  Administered 2017-09-07 – 2017-09-11 (×5): 2 g via INTRAVENOUS
  Filled 2017-09-07 (×5): qty 20

## 2017-09-07 NOTE — Progress Notes (Deleted)
Patient's heart rate 120's, temperature has not improved and is currently 39.3 and patient now hypertensive.  RN stopped levophed drip.  Spoke with MD Debbora Dus and new orders received.

## 2017-09-07 NOTE — Progress Notes (Signed)
OT Cancellation Note  Patient Details Name: Luke Kaufman MRN: [id number] DOB: 1950-06-18   Cancelled Treatment:    Reason Eval/Treat Not Completed: Medical issues which prohibited therapy.  Will check back.  [place], OTR/L [phone]   [name] 09/07/2017, 8:27 AM

## 2017-09-07 NOTE — Progress Notes (Signed)
Subjective: Interval History: none.  Sedated on vent.  Requiring high doses of levo fed to maintain blood pressure and fluid boluses as well.  2500 cc out from abdominal VAC yesterday  Objective: Vital signs in last 24 hours: Temp:  [97.9 F (36.6 C)-100.7 F (38.2 C)] 99.4 F (37.4 C) (05/19 0756) Pulse Rate:  [79-126] 94 (05/19 0733) Resp:  [16-20] 20 (05/19 0600) BP: (72-128)/(37-86) 105/66 (05/19 0733) SpO2:  [88 %-98 %] 94 % (05/19 0600) Arterial Line BP: (86-175)/(44-108) 109/101 (05/19 0600) FiO2 (%):  [40 %-100 %] 50 % (05/19 0738)  Intake/Output from previous day: 05/18 0701 - 05/19 0700 In: 5462.2 [I.V.:5312.2; IV Piggyback:150] Out: 2797 [Urine:997; Emesis/NG output:50; Drains:1700; Blood:50] Intake/Output this shift: No intake/output data recorded.  Diffuse edema.  Abdomen with VAC intact  Lab Results: Recent Labs    09/06/17 0630 09/07/17 0404  WBC 10.4 12.2*  HGB 11.2* 10.6*  HCT 32.8* 30.9*  PLT 147* 156   BMET Recent Labs    09/05/17 1543 09/06/17 0630  NA 136 138  K 4.0 3.6  CL 111 105  CO2 19* 24  GLUCOSE 167* 162*  BUN 28* 23*  CREATININE 2.29* 1.76*  CALCIUM 6.2* 6.4*    Studies/Results: Ct Abdomen Pelvis W Contrast  Result Date: 09/03/2017 CLINICAL DATA:  Abdominal pain with nausea history of ulcer EXAM: CT ABDOMEN AND PELVIS WITH CONTRAST TECHNIQUE: Multidetector CT imaging of the abdomen and pelvis was performed using the standard protocol following bolus administration of intravenous contrast. CONTRAST:  100 cc Isovue-300 intravenous COMPARISON:  Renal ultrasound 05/19/2013 FINDINGS: Lower chest: Lung bases demonstrate patchy dependent atelectasis on the right. No pleural effusion. Heart size upper normal. Coronary vascular calcification. Small hiatal hernia. Hepatobiliary: Peripherally distributed gas in the caudate and left hepatic lobe consistent with portal venous gas. No calcified gallstones. No biliary dilatation Pancreas: Fatty  atrophy with no inflammation Spleen: Normal in size without focal abnormality. Adrenals/Urinary Tract: Adrenal glands are within normal limits. Atrophic left kidney. No hydronephrosis. Bladder is normal Stomach/Bowel: Stomach is nonenlarged. No dilated small bowel. No colon wall thickening. Negative appendix. Sigmoid colon diverticular disease without acute inflammation Vascular/Lymphatic: Moderate severe aortic atherosclerosis. No aneurysm. No significantly enlarged lymph nodes. Reproductive: Enlarged prostate Other: Negative for free air or free fluid. Musculoskeletal: Degenerative changes of the spine. No acute or suspicious abnormality IMPRESSION: 1. Portal venous gas in the caudate and left hepatic lobe, uncertain source or etiology. No areas of abnormal bowel wall thickening or intramural air are visualized. Suggest correlation with lactic acid levels. 2. 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 Atrophic left kidney Electronically Signed   By: Donavan Foil M.D.   On: 09/03/2017 21:36   Dg Chest Port 1 View  Result Date: 09/07/2017 CLINICAL DATA:  Acute respiratory failure. EXAM: PORTABLE CHEST 1 VIEW COMPARISON:  09/06/2017 FINDINGS: Nasogastric tube has tip and side-port over the stomach in the left upper quadrant. Left subclavian central venous catheter unchanged with tip over the SVC. Endotracheal tube has tip approximately 3.6 cm above the carina. Lungs are hypoinflated with persistent hazy bibasilar opacification suggesting small layering effusions/atelectasis. Mild stable cardiomegaly. Remainder of the exam is unchanged. IMPRESSION: Mild hazy bibasilar opacification likely small layering effusions/atelectasis. Infection in the lung bases is possible. Tubes and lines as described. Electronically Signed   By: Marin Olp M.D.   On: 09/07/2017 09:09   Dg Chest Port 1 View  Result Date: 09/06/2017 CLINICAL DATA:  Respiratory failure. EXAM: PORTABLE CHEST 1 VIEW  COMPARISON:   09/05/2017. FINDINGS: Endotracheal tube terminates 3.1 cm above the carina. Right sided central line tip projects over the SVC. Nasogastric tube is followed into the stomach with the tip projecting beyond the inferior margin of the image. Heart size within normal limits. Thoracic aorta is calcified. Lungs are low in volume with bibasilar airspace opacification and bilateral pleural effusions. IMPRESSION: 1. Bibasilar airspace opacification may be due to atelectasis and/or pneumonia. 2. Bilateral pleural effusions. Electronically Signed   By: Lorin Picket M.D.   On: 09/06/2017 07:47   Dg Chest Port 1 View  Result Date: 09/05/2017 CLINICAL DATA:  ETT placement EXAM: PORTABLE CHEST 1 VIEW COMPARISON:  Sep 04, 2017 FINDINGS: The ETT is in good position. The NG tube terminates below today's film. A right central line is stable terminating in the SVC. The cardiomediastinal silhouette is change unchanged. Left lung is clear. Opacity in the medial right lung base is likely vascular crowding. No other interval changes. IMPRESSION: 1. Support apparatus as above. 2. Mild opacity in the medial right lung base is favored to represent vascular crowding. Recommend attention on follow-up. No other abnormalities. Electronically Signed   By: Dorise Bullion III M.D   On: 09/05/2017 08:28   Dg Chest Port 1 View  Result Date: 09/04/2017 CLINICAL DATA:  Hypoxia EXAM: PORTABLE CHEST 1 VIEW COMPARISON:  November 23, 2015 FINDINGS: Endotracheal tube tip is at the carina. Nasogastric tube tip and side port are below the diaphragm. Central catheter tip is in the superior vena cava. No pneumothorax. There is atelectatic change in the right mid lung. There is a small left pleural effusion. Lungs elsewhere are clear. Heart is mildly enlarged with pulmonary vascularity normal. No adenopathy. There is aortic atherosclerosis. No bone lesions. IMPRESSION: Tube and catheter positions as described without pneumothorax. Note that the  endotracheal tube tip is at the carina. Suggest withdrawing endotracheal tube approximately 3 cm. Right midlung atelectasis. Small left pleural effusion. Lungs elsewhere clear. Stable cardiac prominence. These results will be called to the ordering clinician or representative by the Radiologist Assistant, and communication documented in the PACS or zVision Dashboard. Electronically Signed   By: Lowella Grip III M.D.   On: 09/04/2017 10:51   Dg Abd Portable 1v  Result Date: 09/04/2017 CLINICAL DATA:  Status post colon resection and laparotomy. EXAM: PORTABLE ABDOMEN - 1 VIEW COMPARISON:  Radiographs of same day. FINDINGS: The bowel gas pattern is normal. Distal tip of nasogastric tube is seen in expected position of proximal stomach. No radio-opaque calculi or other significant radiographic abnormality are seen. IMPRESSION: Distal tip of nasogastric tube is seen in expected position of proximal stomach. No evidence of bowel obstruction or ileus. Electronically Signed   By: Marijo Conception, M.D.   On: 09/04/2017 11:41   Dg Or Local Abdomen  Result Date: 09/04/2017 CLINICAL DATA:  Instrument count in the operating room EXAM: OR LOCAL ABDOMEN COMPARISON:  CT abdomen and pelvis of 09/03/2017 FINDINGS: Portable supine views of the abdomen show no opaque foreign body. Bowel gas is present which may indicate mild ileus. Single small amount of contrast is noted in the urinary bladder with Foley catheter present. IMPRESSION: No opaque foreign body is seen. I called this report to Otila Kluver in the operating room at the time of interpretation. Electronically Signed   By: Ivar Drape M.D.   On: 09/04/2017 09:52   Anti-infectives: Anti-infectives (From admission, onward)   Start     Dose/Rate Route Frequency Ordered Stop  09/04/17 1400  piperacillin-tazobactam (ZOSYN) IVPB 3.375 g     3.375 g 12.5 mL/hr over 240 Minutes Intravenous Every 8 hours 09/04/17 1157     09/04/17 1100  ceFAZolin (ANCEF) IVPB 2g/100 mL  premix  Status:  Discontinued     2 g 200 mL/hr over 30 Minutes Intravenous Every 8 hours 09/04/17 1035 09/04/17 1157   09/04/17 0630  piperacillin-tazobactam (ZOSYN) IVPB 3.375 g  Status:  Discontinued     3.375 g 100 mL/hr over 30 Minutes Intravenous  Once 09/04/17 0617 09/04/17 0626   09/04/17 0619  piperacillin-tazobactam (ZOSYN) IVPB 3.375 g     3.375 g 100 mL/hr over 30 Minutes Intravenous 60 min pre-op 09/04/17 0620 09/04/17 0717   09/04/17 0030  piperacillin-tazobactam (ZOSYN) IVPB 3.375 g     3.375 g 100 mL/hr over 30 Minutes Intravenous  Once 09/04/17 0024 09/04/17 0144      Assessment/Plan: s/p Procedure(s): EXPLORATORY LAPAROTOMY (N/A) POSSIBLE ILEOSTOMY (Right) APPLICATION OF WOUND VAC (N/A) Nothing to add.  Management per critical care medicine.   LOS: 3 days   Kayia Billinger 09/07/2017, 9:27 AM

## 2017-09-07 NOTE — Progress Notes (Signed)
CRITICAL VALUE ALERT  Critical Value:  Calcium 6.3  Date & Time Notied:  09/07/2017  At 1150  Provider Notified: MD Debbora Dus  Orders Received/Actions taken: new orders received

## 2017-09-07 NOTE — Progress Notes (Deleted)
Patient has only had 25 mL of urine output since 0700.  Spoke with MD Debbora Dus, new orders received.

## 2017-09-07 NOTE — Progress Notes (Signed)
PULMONARY / CRITICAL CARE MEDICINE   Name: Luke Kaufman MRN: [id number] DOB: 19-Jan-1951    ADMISSION DATE:  09/03/2017 CONSULTATION DATE:  5/16  REFERRING MD:  Oneida Alar   CHIEF COMPLAINT:  Post op Vent management   HISTORY OF PRESENT ILLNESS:   67 year old male active smoker (2 PPD) with history of hypertension, CAD, MI, stroke, PAD who presented 5/15 to the ED with complaints of severe epigastric and periumbilical pain.  ER work-up revealed lactate 3.5, fever.  CT abdomen was concerning for portal venous gas.  He was seen by vascular surgery with concern for mesenteric ischemia and underwent mesenteric angiography which showed ischemic right colon.  He then underwent exploratory lap by general surgery with right hemicolectomy.  Postop abd remains open with VAC, he remains on ventilator, transferred to ICU and PCCM consulted for ventilator and medical management.  5/18 The patient is heavily sedated on the ventilaltor. He remains on high doses of pressors. Has put out about 2 liters in the past 24 hours through his wound vac Labs are pending. CXR shows moderate bilateral effusions.  5/19 Little change in overall status The patient remains on high doses of pressors at levophed 40 mcg. The patient did undergo reexploration yesterday with Dr. Grandville Silos in the OR His pH is normal this  M.Awaiting lytes. Has low Mag and low phospjhorous.WBC is little changed.T max about 100.7 last 24 hours. Unfortunately the patient remains fairly oliguric despite vigorous volume replacement      VITAL SIGNS: BP 105/66   Pulse 94   Temp 99.4 F (37.4 C) (Oral)   Resp 20   Ht 5\' 7"  (1.702 m)   Wt 251 lb 15.8 oz (114.3 kg)   SpO2 94%   BMI 39.47 kg/m   HEMODYNAMICS: CVP:  [6 mmHg-10 mmHg] 7 mmHg  VENTILATOR SETTINGS: Vent Mode: CPAP;PSV FiO2 (%):  [40 %-100 %] 50 % Set Rate:  [20 bmp] 20 bmp Vt Set:  [540 mL] 540 mL PEEP:  [8 cmH20] 8 cmH20 Pressure Support:  [5 cmH20] 5 cmH20 Plateau  Pressure:  [20 cmH20-27 cmH20] 21 cmH20  INTAKE / OUTPUT: I/O last 3 completed shifts: In: 9939.4 [I.V.:9189.4; IV Piggyback:750] Out: 9233 [Urine:1202; Emesis/NG output:250; Drains:2700; Blood:50]  PHYSICAL EXAMINATION: General:  Acutelyhronically ill appearing male, hemodynamically labile Neuro:  Sedated post op, RASS -3,  HEENT: NCAT,  Mm moist, ETT  Cardiovascular: S1, S2, tachy with PAC's,  Lungs:  resps even non labored on vent, coarse throughout , few crackles per bases Abdomen:  Open, large wound VAC in place with bowel edema and  protrusion noted around VAC sponge Musculoskeletal:  Warm and dry, scant BLE edema , no mottling noted  LABS:  BMET Recent Labs  Lab 09/05/17 0750 09/05/17 1543 09/06/17 0630  NA 135 136 138  K 4.2 4.0 3.6  CL 109 111 105  CO2 17* 19* 24  BUN 27* 28* 23*  CREATININE 2.32* 2.29* 1.76*  GLUCOSE 141* 167* 162*    Electrolytes Recent Labs  Lab 09/04/17 1036 09/05/17 0750 09/05/17 1543 09/06/17 0630 09/07/17 0404  CALCIUM 7.8* 6.8* 6.2* 6.4*  --   MG 1.8 1.4*  --   --  1.6*  PHOS  --   --   --   --  1.2*    CBC Recent Labs  Lab 09/05/17 0750 09/06/17 0630 09/07/17 0404  WBC 11.0* 10.4 12.2*  HGB 12.8* 11.2* 10.6*  HCT 37.7* 32.8* 30.9*  PLT 162 147* 156    Coag's Recent  Labs  Lab 09/04/17 1036  APTT 56*  INR 1.60    Sepsis Markers Recent Labs  Lab 09/04/17 0126 09/04/17 1624 09/05/17 0750 09/05/17 1021 09/06/17 0630  LATICACIDVEN 3.20* 1.7  --  1.4  --   PROCALCITON  --   --  3.22  --  2.10    ABG Recent Labs  Lab 09/05/17 2041 09/06/17 0747 09/07/17 0315  PHART 7.303* 7.401 7.433  PCO2ART 36.9 38.5 47.6  PO2ART 87.0 91.0 70.0*    Liver Enzymes Recent Labs  Lab 09/03/17 1644 09/05/17 0750  AST 25 376*  ALT 16* 313*  ALKPHOS 104 47  BILITOT 0.6 0.8  ALBUMIN 4.0 2.4*    Cardiac Enzymes Recent Labs  Lab 09/05/17 1352 09/05/17 2035 09/06/17 0148  TROPONINI 0.36* 0.27* 0.29*     Glucose Recent Labs  Lab 09/06/17 0819 09/06/17 1214 09/06/17 1630 09/06/17 2010 09/07/17 0044 09/07/17 0344  GLUCAP 159* 139* 164* 157* 159* 161*    Imaging No results found.   STUDIES:  CT abd/pelvis 5/15>>> 1. Portal venous gas in the caudate and left hepatic lobe, uncertain source or etiology. No areas of abnormal bowel wall thickening or intramural air are visualized. Suggest correlation with lactic acid levels. 2. 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 Atrophic left kidney  CULTURES: BC x 2 5/17 >>> still negative  ANTIBIOTICS: Zosyn 5/16>>>  SIGNIFICANT EVENTS: OR 5/16>>>  LINES/TUBES: ETT 5/16>>>ischemic R colon, exp lap, R hemicolectomy    DISCUSSION: 67 year old male with ischemic bowel, status post exploratory lap 5/16 with postop vent needs.  ASSESSMENT / PLAN:  PULMONARY Acute respiratory failure-postop exploratory laparotomy for ischemic bowel Tobacco abuse-2 PPD Patient has increasing infusions. Not surprising given his surgery, decreased albumin and vigorous resuscitation efforts. It will likely be several days more on the ventialtor. 5/19 The patient is sufficiently oxygenating and ventilating. CXR show mild to moderate bilateral effusions    CARDIOVASCULAR History of HTN History CAD, MI PAD Hypotension Tachy Troponin bump>> ? Ischemic demand Large VAC dressing output ( 2500 cc last 24)  ( 2/2 third spaced bowel edema) CVP is 11 + 10 L per flowsheet Lactate had returned to [facility], but will re-check as has become more pressor dependent 5/18 Patient is in a sinus tach He remains on 35 mcg/min Levophed which has been the case. 5/19 Little change hemodynamically.Will check random cortisol.   P:  Telemetry monitoring Pressors to maintain MAP > 65 Fluid replacement for wound loss with LR for now CVP q 4 Trend  lactate, pct  Trend troponin until clear Check co-ox now EKG now and PRN Will order Echo    RENAL Lactic acidosis  Non-gap acidosis ( ? 2/2 saline repletion) AKI - mild.  Baseline Scr ~ 1.2.  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 Peptic ulcer disease Bowel perfusion and distension noted, Bowel is pink NG output about 300 since OR 5/16 Large volume output from VAC P:   N.P.O PPI Per surgery>> plan to take to OR 5/18 ( close or ileostomy) Antibiotics as above   HEMATOLOGIC No active issue  P:  F/u CBC now and in am Transfuse for HGB < 7  SCD's  Monitor for active bleeding 5/19 HB >10 Platelets 156K  INFECTIOUS  Mesenteric ischemia  Open abd  P:   Zosyn per pharmacy  Trend pct  Follow Micro Pan culture  Blood cultures still negative as of 5/18  ENDOCRINE Hyperglycemia  P:   hgbA1c  CBG Q 4 SSI  5/19 Blood sugara are reasonably well controlled  NEUROLOGIC Sedation needs (ooen abd) Per surgery, keep well sedated  P:   RASS goal: -3 Propofol, PRN fentanyl  No WUA prior to OR 5/18   In summary we have an older man who presented with signidfcant bowel ischemia s/p right hemicolectomy on the ventialtor on high pressors. Outlook remains guarded  I have not seen any family at the bedsie. I believe e thepatient has a sister who lives locally. He has no wife of kidsd. His prognosis remains guarded given his pressor requirements and mutlisytem issues.       V  Day [place], AGACNP-BC [place]. Pager: [phone].  09/07/2017  8:08 AM

## 2017-09-07 NOTE — Consult Note (Addendum)
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 Placed in the OR area.  The abdomen is significantly distended. Education provided: None, patient unable at this time to participate. At the time of my assessment in 2H02, the patient is heavily sedated, on a vent requiring IV pressors and fluid boluses to manage his BP.  Large Prevena VAC to abdominal wound, putting out large amounts of sersanginous drainage.  The RUQ ileostomy pouch is placed over the drape of the Prevena.  The primary RN states the patient will likely go back to the OR 09/08/17.  A decision was made not to remove the pouch placed in the OR yesterday to preserve the VAC seal and pouch seal.  The seal on the pouch is intact; it is best to leave it that way.  Per Dr. Scherrie Merritts note 09/07/17 at 0808,  the patient "underwent mesenteric angiography which showed ischemic right colon".  As best I can tell, the stoma looks darkened.  I have sent an A11 to material management for the following items to be placed at the patient's bedside:  4 barrier rings Kellie Simmering [phone]); 4, 2 inch convex pouches Kellie Simmering [phone]).  Val Riles, RN, MSN, CWOCN, CNS-BC, pager [phone]

## 2017-09-07 NOTE — Progress Notes (Addendum)
Patient experienced episode of significant hypotension, MD Debbora Dus to bedside and new orders received to give patient 250 mL bolus and to start vasopressin drip.

## 2017-09-07 NOTE — Progress Notes (Signed)
1 Day Post-Op   Subjective/Chief Complaint: On vent   Objective: Vital signs in last 24 hours: Temp:  [97.9 F (36.6 C)-100.7 F (38.2 C)] 99.4 F (37.4 C) (05/19 0756) Pulse Rate:  [79-126] 98 (05/19 1005) Resp:  [10-20] 10 (05/19 1005) BP: (72-130)/(37-86) 130/78 (05/19 1005) SpO2:  [92 %-98 %] 93 % (05/19 1005) Arterial Line BP: (86-128)/(44-108) 109/101 (05/19 0600) FiO2 (%):  [50 %-60 %] 50 % (05/19 0738)    Intake/Output from previous day: 05/18 0701 - 05/19 0700 In: 5462.2 [I.V.:5312.2; IV Piggyback:150] Out: 2797 [Urine:997; Emesis/NG output:50; Drains:1700; Blood:50] Intake/Output this shift: Total I/O In: -  Out: 685 [Urine:185; Drains:500]  General appearance: no distress Resp: ronchi and rales bilaterally Cardio: irregularly irregular rhythm GI: open abdomen VAC with pink bowel seen at edges today, edematous; ileostomy with dusky but viable mucosa with stool in bag  Lab Results:  Recent Labs    09/06/17 0630 09/07/17 0404  WBC 10.4 12.2*  HGB 11.2* 10.6*  HCT 32.8* 30.9*  PLT 147* 156   BMET Recent Labs    09/05/17 1543 09/06/17 0630  NA 136 138  K 4.0 3.6  CL 111 105  CO2 19* 24  GLUCOSE 167* 162*  BUN 28* 23*  CREATININE 2.29* 1.76*  CALCIUM 6.2* 6.4*   PT/INR Recent Labs    09/04/17 1036  LABPROT 18.9*  INR 1.60   ABG Recent Labs    09/06/17 0747 09/07/17 0315 09/07/17 0924  PHART 7.401 7.433  --   HCO3 23.9 31.7* 30.5*    Studies/Results: Dg Chest Port 1 View  Result Date: 09/07/2017 CLINICAL DATA:  Acute respiratory failure. EXAM: PORTABLE CHEST 1 VIEW COMPARISON:  09/06/2017 FINDINGS: Nasogastric tube has tip and side-port over the stomach in the left upper quadrant. Left subclavian central venous catheter unchanged with tip over the SVC. Endotracheal tube has tip approximately 3.6 cm above the carina. Lungs are hypoinflated with persistent hazy bibasilar opacification suggesting small layering effusions/atelectasis. Mild  stable cardiomegaly. Remainder of the exam is unchanged. IMPRESSION: Mild hazy bibasilar opacification likely small layering effusions/atelectasis. Infection in the lung bases is possible. Tubes and lines as described. Electronically Signed   By: Marin Olp M.D.   On: 09/07/2017 09:09   Dg Chest Port 1 View  Result Date: 09/06/2017 CLINICAL DATA:  Respiratory failure. EXAM: PORTABLE CHEST 1 VIEW COMPARISON:  09/05/2017. FINDINGS: Endotracheal tube terminates 3.1 cm above the carina. Right sided central line tip projects over the SVC. Nasogastric tube is followed into the stomach with the tip projecting beyond the inferior margin of the image. Heart size within normal limits. Thoracic aorta is calcified. Lungs are low in volume with bibasilar airspace opacification and bilateral pleural effusions. IMPRESSION: 1. Bibasilar airspace opacification may be due to atelectasis and/or pneumonia. 2. Bilateral pleural effusions. Electronically Signed   By: Lorin Picket M.D.   On: 09/06/2017 07:47    Anti-infectives: Anti-infectives (From admission, onward)   Start     Dose/Rate Route Frequency Ordered Stop   09/04/17 1400  piperacillin-tazobactam (ZOSYN) IVPB 3.375 g     3.375 g 12.5 mL/hr over 240 Minutes Intravenous Every 8 hours 09/04/17 1157     09/04/17 1100  ceFAZolin (ANCEF) IVPB 2g/100 mL premix  Status:  Discontinued     2 g 200 mL/hr over 30 Minutes Intravenous Every 8 hours 09/04/17 1035 09/04/17 1157   09/04/17 0630  piperacillin-tazobactam (ZOSYN) IVPB 3.375 g  Status:  Discontinued     3.375 g 100  mL/hr over 30 Minutes Intravenous  Once 09/04/17 0617 09/04/17 0626   09/04/17 0619  piperacillin-tazobactam (ZOSYN) IVPB 3.375 g     3.375 g 100 mL/hr over 30 Minutes Intravenous 60 min pre-op 09/04/17 0620 09/04/17 0717   09/04/17 0030  piperacillin-tazobactam (ZOSYN) IVPB 3.375 g     3.375 g 100 mL/hr over 30 Minutes Intravenous  Once 09/04/17 0024 09/04/17 0144       Assessment/Plan: s/p Procedure(s): EXPLORATORY LAPAROTOMY (N/A) POSSIBLE ILEOSTOMY (Right) APPLICATION OF WOUND VAC (N/A)  Mesenteric ischemia POD 1, s/p washout, end ilesotomy and reapplication of temporary abdominal closure device POD 3, s/p right colectomy, discontinuity, open abdominal VAC applied by Dr. Dorris Fetch Byerly/Dr. Annye English POD 3, s/p aortogram with aorto to superior mesenteric artery bypass (8 mm Dacron) by Dr. Ruta Hinds -patient on pressor support this morning still with hypotension. -low UOP -on zosyn -back to OR tomorrow for ex lap, washout, possible abdominal closure - will depend on amount of bowel edema, clinical status, etc  VDRF -per CCM  H/O HTN/CAD/MI  Decreased UOP - per primary, CCM.    Hypotension -per CCM, on pressors  FEN - NPO, NGT VTE - SCDs/no chemical prophylaxis currently ID - zosyn  Dispo -   LOS: 3 days   Ileana Roup 09/07/2017

## 2017-09-08 ENCOUNTER — Inpatient Hospital Stay (HOSPITAL_COMMUNITY): Payer: PPO | Admitting: Anesthesiology

## 2017-09-08 ENCOUNTER — Encounter (HOSPITAL_COMMUNITY)
Admission: EM | Disposition: A | Discharge status: Nursing Home (Includes ICF) | Payer: Self-pay | Source: Home / Self Care | Attending: Pulmonary Disease

## 2017-09-08 ENCOUNTER — Encounter (HOSPITAL_COMMUNITY): Payer: Self-pay | Admitting: Certified Registered"

## 2017-09-08 DIAGNOSIS — N179 Acute kidney failure, unspecified: Secondary | ICD-10-CM

## 2017-09-08 HISTORY — PX: LAPAROTOMY: SHX154

## 2017-09-08 HISTORY — PX: APPLICATION OF WOUND VAC: SHX5189

## 2017-09-08 LAB — POCT I-STAT 3, ART BLOOD GAS (G3+)
Acid-Base Excess: 8 mmol/L — ABNORMAL HIGH (ref 0.0–2.0)
Bicarbonate: 31.7 mmol/L — ABNORMAL HIGH (ref 20.0–28.0)
O2 SAT: 97 %
PCO2 ART: 40.2 mmHg (ref 32.0–48.0)
PH ART: 7.506 — AB (ref 7.350–7.450)
PO2 ART: 79 mmHg — AB (ref 83.0–108.0)
Patient temperature: 98.9
TCO2: 33 mmol/L — ABNORMAL HIGH (ref 22–32)

## 2017-09-08 LAB — POCT I-STAT 7, (LYTES, BLD GAS, ICA,H+H)
Acid-Base Excess: 1 mmol/L (ref 0.0–2.0)
BICARBONATE: 25.8 mmol/L (ref 20.0–28.0)
Calcium, Ion: 0.96 mmol/L — ABNORMAL LOW (ref 1.15–1.40)
HCT: 24 % — ABNORMAL LOW (ref 39.0–52.0)
Hemoglobin: 8.2 g/dL — ABNORMAL LOW (ref 13.0–17.0)
O2 Saturation: 97 %
PCO2 ART: 39.6 mmHg (ref 32.0–48.0)
PH ART: 7.423 (ref 7.350–7.450)
PO2 ART: 89 mmHg (ref 83.0–108.0)
Potassium: 3.1 mmol/L — ABNORMAL LOW (ref 3.5–5.1)
Sodium: 136 mmol/L (ref 135–145)
TCO2: 27 mmol/L (ref 22–32)

## 2017-09-08 LAB — CBC
HEMATOCRIT: 29.9 % — AB (ref 39.0–52.0)
Hemoglobin: 10.2 g/dL — ABNORMAL LOW (ref 13.0–17.0)
MCH: 30 pg (ref 26.0–34.0)
MCHC: 34.1 g/dL (ref 30.0–36.0)
MCV: 87.9 fL (ref 78.0–100.0)
Platelets: 163 10*3/uL (ref 150–400)
RBC: 3.4 MIL/uL — ABNORMAL LOW (ref 4.22–5.81)
RDW: 13.5 % (ref 11.5–15.5)
WBC: 11.3 10*3/uL — ABNORMAL HIGH (ref 4.0–10.5)

## 2017-09-08 LAB — GLUCOSE, CAPILLARY
GLUCOSE-CAPILLARY: 119 mg/dL — AB (ref 65–99)
GLUCOSE-CAPILLARY: 128 mg/dL — AB (ref 65–99)
GLUCOSE-CAPILLARY: 136 mg/dL — AB (ref 65–99)
GLUCOSE-CAPILLARY: 137 mg/dL — AB (ref 65–99)
GLUCOSE-CAPILLARY: 139 mg/dL — AB (ref 65–99)
Glucose-Capillary: 123 mg/dL — ABNORMAL HIGH (ref 65–99)
Glucose-Capillary: 140 mg/dL — ABNORMAL HIGH (ref 65–99)

## 2017-09-08 LAB — BASIC METABOLIC PANEL
Anion gap: 9 (ref 5–15)
BUN: 12 mg/dL (ref 6–20)
CHLORIDE: 99 mmol/L — AB (ref 101–111)
CO2: 27 mmol/L (ref 22–32)
CREATININE: 1.25 mg/dL — AB (ref 0.61–1.24)
Calcium: 6.7 mg/dL — ABNORMAL LOW (ref 8.9–10.3)
GFR calc Af Amer: >60 mL/min (ref >60)
GFR calc non Af Amer: 58 mL/min — ABNORMAL LOW (ref >60)
GLUCOSE: 133 mg/dL — AB (ref 65–99)
Potassium: 3.1 mmol/L — ABNORMAL LOW (ref 3.5–5.1)
Sodium: 135 mmol/L (ref 135–145)

## 2017-09-08 LAB — PHOSPHORUS
Phosphorus: 1.9 mg/dL — ABNORMAL LOW (ref 2.5–4.6)
Phosphorus: 1.9 mg/dL — ABNORMAL LOW (ref 2.5–4.6)

## 2017-09-08 LAB — MAGNESIUM
MAGNESIUM: 1.8 mg/dL (ref 1.7–2.4)
Magnesium: 2 mg/dL (ref 1.7–2.4)

## 2017-09-08 SURGERY — LAPAROTOMY, EXPLORATORY
Anesthesia: General | Site: Abdomen

## 2017-09-08 MED ORDER — ROCURONIUM BROMIDE 100 MG/10ML IV SOLN
INTRAVENOUS | Status: DC | PRN
  Administered 2017-09-08 (×3): 50 mg via INTRAVENOUS

## 2017-09-08 MED ORDER — 0.9 % SODIUM CHLORIDE (POUR BTL) OPTIME
TOPICAL | Status: DC | PRN
  Administered 2017-09-08 (×4): 1000 mL

## 2017-09-08 MED ORDER — MIDAZOLAM HCL 2 MG/2ML IJ SOLN
1.0000 mg | INTRAMUSCULAR | Status: DC | PRN
  Administered 2017-09-10 – 2017-09-15 (×12): 1 mg via INTRAVENOUS
  Filled 2017-09-08 (×12): qty 2

## 2017-09-08 MED ORDER — BACITRACIN ZINC 500 UNIT/GM EX OINT
TOPICAL_OINTMENT | CUTANEOUS | Status: AC
  Filled 2017-09-08: qty 28.35

## 2017-09-08 MED ORDER — NOREPINEPHRINE 16 MG/250ML-% IV SOLN
0.0000 ug/min | INTRAVENOUS | Status: DC
  Administered 2017-09-08: 4 ug/min via INTRAVENOUS
  Administered 2017-09-09: 12 ug/min via INTRAVENOUS
  Administered 2017-09-10: 10 ug/min via INTRAVENOUS
  Administered 2017-09-11: 17 ug/min via INTRAVENOUS
  Administered 2017-09-12: 8 ug/min via INTRAVENOUS
  Administered 2017-09-12: 9 ug/min via INTRAVENOUS
  Administered 2017-09-15: 2 ug/min via INTRAVENOUS
  Filled 2017-09-08 (×10): qty 250

## 2017-09-08 MED ORDER — POTASSIUM CHLORIDE 10 MEQ/100ML IV SOLN
10.0000 meq | INTRAVENOUS | Status: AC
  Administered 2017-09-08 (×4): 10 meq via INTRAVENOUS
  Filled 2017-09-08 (×4): qty 100

## 2017-09-08 MED ORDER — VITAL HIGH PROTEIN PO LIQD
1000.0000 mL | ORAL | Status: DC
  Administered 2017-09-08 – 2017-09-09 (×4): 1000 mL

## 2017-09-08 MED ORDER — LACTATED RINGERS IV SOLN
INTRAVENOUS | Status: DC | PRN
  Administered 2017-09-08: 07:00:00 via INTRAVENOUS

## 2017-09-08 MED ORDER — ALBUMIN HUMAN 5 % IV SOLN
INTRAVENOUS | Status: DC | PRN
  Administered 2017-09-08 (×2): via INTRAVENOUS

## 2017-09-08 MED ORDER — PRO-STAT SUGAR FREE PO LIQD
30.0000 mL | Freq: Two times a day (BID) | ORAL | Status: DC
  Administered 2017-09-08 – 2017-09-09 (×3): 30 mL
  Filled 2017-09-08 (×3): qty 30

## 2017-09-08 SURGICAL SUPPLY — 44 items
BLADE CLIPPER SURG (BLADE) IMPLANT
CANISTER SUCT 3000ML PPV (MISCELLANEOUS) ×4 IMPLANT
CANISTER WOUND CARE 500ML ATS (WOUND CARE) ×4 IMPLANT
CHLORAPREP W/TINT 26ML (MISCELLANEOUS) IMPLANT
COVER SURGICAL LIGHT HANDLE (MISCELLANEOUS) ×4 IMPLANT
DRAPE LAPAROSCOPIC ABDOMINAL (DRAPES) ×4 IMPLANT
DRAPE WARM FLUID 44X44 (DRAPE) ×4 IMPLANT
DRSG OPSITE POSTOP 4X10 (GAUZE/BANDAGES/DRESSINGS) IMPLANT
DRSG OPSITE POSTOP 4X8 (GAUZE/BANDAGES/DRESSINGS) IMPLANT
ELECT BLADE 6.5 EXT (BLADE) IMPLANT
ELECT CAUTERY BLADE 6.4 (BLADE) ×4 IMPLANT
ELECT REM PT RETURN 9FT ADLT (ELECTROSURGICAL) ×4
ELECTRODE REM PT RTRN 9FT ADLT (ELECTROSURGICAL) ×2 IMPLANT
GLOVE BIOGEL PI IND STRL 6.5 (GLOVE) ×4 IMPLANT
GLOVE BIOGEL PI IND STRL 8 (GLOVE) ×2 IMPLANT
GLOVE BIOGEL PI INDICATOR 6.5 (GLOVE) ×4
GLOVE BIOGEL PI INDICATOR 8 (GLOVE) ×2
GLOVE ECLIPSE 7.5 STRL STRAW (GLOVE) ×8 IMPLANT
GLOVE SURG SS PI 6.5 STRL IVOR (GLOVE) ×8 IMPLANT
GOWN STRL REUS W/ TWL LRG LVL3 (GOWN DISPOSABLE) ×6 IMPLANT
GOWN STRL REUS W/TWL LRG LVL3 (GOWN DISPOSABLE) ×6
KIT BASIN OR (CUSTOM PROCEDURE TRAY) ×4 IMPLANT
KIT COLOSTOMY ILEOSTOMY 4 (WOUND CARE) ×4 IMPLANT
KIT TURNOVER KIT B (KITS) ×4 IMPLANT
LIGASURE IMPACT 36 18CM CVD LR (INSTRUMENTS) IMPLANT
NS IRRIG 1000ML POUR BTL (IV SOLUTION) ×16 IMPLANT
PACK GENERAL/GYN (CUSTOM PROCEDURE TRAY) ×4 IMPLANT
PAD ARMBOARD 7.5X6 YLW CONV (MISCELLANEOUS) ×4 IMPLANT
RETAINER VISCERA MED (MISCELLANEOUS) ×4 IMPLANT
SEPRAFILM PROCEDURAL PACK 3X5 (MISCELLANEOUS) IMPLANT
SPECIMEN JAR LARGE (MISCELLANEOUS) IMPLANT
SPONGE ABDOMINAL VAC ABTHERA (MISCELLANEOUS) ×4 IMPLANT
SPONGE LAP 18X18 X RAY DECT (DISPOSABLE) ×8 IMPLANT
STAPLER VISISTAT 35W (STAPLE) ×4 IMPLANT
SUCTION POOLE TIP (SUCTIONS) ×4 IMPLANT
SUT NOVA 1 T20/GS 25DT (SUTURE) ×12 IMPLANT
SUT PDS AB 1 TP1 96 (SUTURE) IMPLANT
SUT SILK 2 0 SH CR/8 (SUTURE) ×4 IMPLANT
SUT SILK 2 0 TIES 10X30 (SUTURE) IMPLANT
SUT SILK 3 0 SH CR/8 (SUTURE) IMPLANT
SUT SILK 3 0 TIES 10X30 (SUTURE) IMPLANT
TOWEL OR 17X26 10 PK STRL BLUE (TOWEL DISPOSABLE) ×4 IMPLANT
TRAY FOLEY MTR SLVR 16FR STAT (SET/KITS/TRAYS/PACK) IMPLANT
YANKAUER SUCT BULB TIP NO VENT (SUCTIONS) IMPLANT

## 2017-09-08 NOTE — Anesthesia Preprocedure Evaluation (Signed)
Anesthesia Evaluation  Patient identified by MRN, date of birth, ID bandGeneral Assessment Comment:Pt intubated on vent  Reviewed: Allergy & Precautions, H&P , NPO status , Patient's Chart, lab work & pertinent test results  Airway Mallampati: Intubated       Dental   Pulmonary Current Smoker,  On vent   breath sounds clear to auscultation       Cardiovascular hypertension, + CAD, + Past MI and + Peripheral Vascular Disease   Rhythm:regular Rate:Normal     Neuro/Psych Depression CVA    GI/Hepatic PUD, S/p ex-lap bowel resection for ischemia.     Endo/Other    Renal/GU      Musculoskeletal   Abdominal   Peds  Hematology   Anesthesia Other Findings   Reproductive/Obstetrics                             Anesthesia Physical Anesthesia Plan  ASA: IV  Anesthesia Plan: General   Post-op Pain Management:    Induction: Intravenous  PONV Risk Score and Plan: 1 and Treatment may vary due to age or medical condition and Ondansetron  Airway Management Planned: Oral ETT  Additional Equipment:   Intra-op Plan:   Post-operative Plan: Post-operative intubation/ventilation  Informed Consent: I have reviewed the patients History and Physical, chart, labs and discussed the procedure including the risks, benefits and alternatives for the proposed anesthesia with the patient or authorized representative who has indicated his/her understanding and acceptance.     Plan Discussed with: Anesthesiologist, Surgeon and CRNA  Anesthesia Plan Comments:         Anesthesia Quick Evaluation

## 2017-09-08 NOTE — Progress Notes (Signed)
PULMONARY / CRITICAL CARE MEDICINE   Name: Luke Kaufman MRN: [id number] DOB: Feb 26, 1951    ADMISSION DATE:  09/03/2017 CONSULTATION DATE:  5/16  REFERRING MD:  Oneida Alar  CHIEF COMPLAINT:  Post op Vent management  HISTORY OF PRESENT ILLNESS:   67 y/o male presented on 5/15 with abdominal pain, had an ischemic right colon.  Had a R hemicolectomy and eventually an ileostomy.    SUBJECTIVE:  Went back to OR this morning, had fascia partially closed, abdominal washout  VITAL SIGNS: BP (!) 113/99 (BP Location: Left Leg)   Pulse (!) 128   Temp 97.8 F (36.6 C) (Oral)   Resp 20   Ht 5\' 7"  (1.702 m)   Wt 114.3 kg (251 lb 15.8 oz)   SpO2 96%   BMI 39.47 kg/m   HEMODYNAMICS: CVP:  [4 mmHg-11 mmHg] 11 mmHg  VENTILATOR SETTINGS: Vent Mode: PRVC FiO2 (%):  [40 %-100 %] 100 % Set Rate:  [20 bmp] 20 bmp Vt Set:  [540 mL] 540 mL PEEP:  [8 cmH20] 8 cmH20 Plateau Pressure:  [18 cmH20-22 cmH20] 18 cmH20  INTAKE / OUTPUT: I/O last 3 completed shifts: In: 6854.9 [I.V.:5838.2; IV Piggyback:1016.7] Out: 8841 [YSAYT:0160; Emesis/NG output:250; Drains:2650; Stool:100]  PHYSICAL EXAMINATION:  General:  In bed on vent HENT: NCAT ETT in place PULM: CTA B, vent supported breathing CV: RRR, no mgr GI: abdominal wound vac in place, no bowel sounds MSK: normal bulk and tone Derm: edema legs Neuro: sedated on vent   LABS:  BMET Recent Labs  Lab 09/06/17 0630 09/07/17 1031 09/08/17 0423 09/08/17 0842  NA 138 137 135 136  K 3.6 3.1* 3.1* 3.1*  CL 105 98* 99*  --   CO2 24 31 27   --   BUN 23* 14 12  --   CREATININE 1.76* 1.57* 1.25*  --   GLUCOSE 162* 151* 133*  --     Electrolytes Recent Labs  Lab 09/05/17 0750  09/06/17 0630 09/07/17 0404 09/07/17 1031 09/08/17 0423  CALCIUM 6.8*   < > 6.4*  --  6.3* 6.7*  MG 1.4*  --   --  1.6*  --  2.0  PHOS  --   --   --  1.2*  --  1.9*   < > = values in this interval not displayed.    CBC Recent Labs  Lab 09/06/17 0630  09/07/17 0404 09/08/17 0423 09/08/17 0842  WBC 10.4 12.2* 11.3*  --   HGB 11.2* 10.6* 10.2* 8.2*  HCT 32.8* 30.9* 29.9* 24.0*  PLT 147* 156 163  --     Coag's Recent Labs  Lab 09/04/17 1036  APTT 56*  INR 1.60    Sepsis Markers Recent Labs  Lab 09/04/17 0126 09/04/17 1624 09/05/17 0750 09/05/17 1021 09/06/17 0630  LATICACIDVEN 3.20* 1.7  --  1.4  --   PROCALCITON  --   --  3.22  --  2.10    ABG Recent Labs  Lab 09/07/17 1041 09/08/17 0210 09/08/17 0842  PHART 7.434 7.506* 7.423  PCO2ART 46.1 40.2 39.6  PO2ART 66.0* 79.0* 89.0    Liver Enzymes Recent Labs  Lab 09/03/17 1644 09/05/17 0750  AST 25 376*  ALT 16* 313*  ALKPHOS 104 47  BILITOT 0.6 0.8  ALBUMIN 4.0 2.4*    Cardiac Enzymes Recent Labs  Lab 09/05/17 1352 09/05/17 2035 09/06/17 0148  TROPONINI 0.36* 0.27* 0.29*    Glucose Recent Labs  Lab 09/07/17 0922 09/07/17 1222 09/07/17 1616 09/07/17 1945  09/07/17 2356 09/08/17 0421  GLUCAP 152* 158* 162* 138* 137* 136*    Imaging No results found.   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 As of 5/20, fascia is partially open.  ASSESSMENT / PLAN:  PULMONARY A: Acute respiratory failure with hypoxemia P:   Full mechanical vent support VAP prevention No plans for weaning vent until the abdominal fascia is closed   CARDIOVASCULAR A:  Shock:  presumably sedation related as rapidly improving 5/20 P:  Tele Wean off of levophed and vasopressin for MAP > 65  RENAL A:   Hypokalemia AKI> improved P:   LR at 50cc/hr Stop D5 Supplement K Monitor BMET and UOP Replace electrolytes as needed  GASTROINTESTINAL A:    Ischemic colon, s/p hemicolectomy and ileostomy Abdomen still open, partially closed on 5/20 P:   Discuss with surgery when we can use his gut for enteral feeding Continue famotidine Back to OR on 5/22  HEMATOLOGIC A:   Anemia without bleeding P:  Monitor for bleeding Transfuse for Hgb < 7gm/dL  INFECTIOUS A:   Ischemic bowel, s/p abdominal washout several times, appeared clear on 5/20 E.coli HCAP P:   Continue ceftriaxone, plan 5 days  ENDOCRINE A:    Mild hyperglycemia P:   SSI  NEUROLOGIC A:   Sedation needs for vent synchrony P:   RASS goal: -1 Stop versed drip, use intermittent Continue fentanyl infusion and prn per PAD protocol   FAMILY  - Updates: I called his sister Bethena Roys for an update today.  She tells me that she is his only living relative.  He has a special needs child.    - Inter-disciplinary family meet or Palliative Care meeting due by:  day 7  My CC time 35 minutes  Roselie Awkward, MD [facility] Pager: [phone] Cell: [phone] After 3pm or if no response, call [phone]   09/08/2017, 9:41 AM

## 2017-09-08 NOTE — Progress Notes (Signed)
PT Cancellation Note  Patient Details Name: Luke Kaufman MRN: [id number] DOB: March 15, 1951   Cancelled Treatment:    Reason Eval/Treat Not Completed: Medical issues which prohibited therapy. Pt remains on the vent and sedated. Per chart/notes. Pt may be going back to surgery today as well. Acute PT to return as able, as appropriate.  Kittie Plater, PT, DPT Pager #: [phone] Office #: [phone]    Berline Lopes 09/08/2017, 7:48 AM

## 2017-09-08 NOTE — Op Note (Signed)
OPERATIVE REPORT  DATE OF OPERATION: 09/08/2017  PATIENT:  Luke Kaufman  67 y.o. male  PRE-OPERATIVE DIAGNOSIS:  OPEN ABDOMEN, MESENTERIC ISCHEMIA  POST-OPERATIVE DIAGNOSIS:  OPEN ABDOMEN, MESENTERIC ISCHEMIA  INDICATION(S) FOR OPERATION:  Open abdomen for attempted closure  FINDINGS:  No obvious infection.  Bowel was viable.  PROCEDURE:  Procedure(s): EXPLORATORY LAPAROTOMY, ABDOMINAL WASHOUT, PARTIAL CLOSURE OF ABDOMEN APPLICATION OF WOUND VAC  SURGEON:  Surgeon(s): Judeth Horn, MD  ASSISTANT: None  ANESTHESIA:   general  COMPLICATIONS:  None  EBL: <10 ml  BLOOD ADMINISTERED: none  DRAINS: Nasogastric Tube, Urinary Catheter (Foley) and NPWD   SPECIMEN:  No Specimen  COUNTS CORRECT:  YES  PROCEDURE DETAILS: The patient was taken to the operating room directly from his intensive care unit bed and placed on the table in supine position.  He had an open abdomen from prior procedures and today was for aspiration and possible closure.  A proper timeout was performed identifying the patient and the procedure to be performed.  We removed the outer portion of the dressing and prepped and draped in usual sterile manner.  We explored the abdomen and irrigated with saline in all 4 quadrants.  There was no area of infectious concern and the bowel appeared to be completely viable.  We closed part of the abdomen and part of the fascia superiorly and inferiorly using interrupted #1 Novafil sutures.  Approximately 7 sutures were placed on the inferior aspect of the wound and 5 on the superior aspect leaving a central portion of approximately 12 cm open lower the place the negative pressure wound dressing.  We subsequently placed the negative pressure wound dressing using a small piece of the first external sponge to cover the inner portion and then almost a complete sponge on the subcutaneous tissue.  All counts were correct including needles, sponges, and instruments.  An ileostomy  device was replaced.  PATIENT DISPOSITION:  ICU - intubated and hemodynamically stable.   Judeth Horn 5/20/20198:55 AM

## 2017-09-08 NOTE — Progress Notes (Signed)
Patient ID: Luke Kaufman, male   DOB: 03/13/1951, 67 y.o.   MRN: [id number] Remains critically ill.  No active vascular issues.  Discussed with Dr.McQuaid.  Will transfer to critical care medicine service as primary attending.  Following with you

## 2017-09-08 NOTE — Transfer of Care (Signed)
Immediate Anesthesia Transfer of Care Note  Patient: Luke Kaufman  Procedure(s) Performed: EXPLORATORY LAPAROTOMY, ABDOMINAL WASHOUT, PARTIAL CLOSURE OF ABDOMEN (N/A Abdomen) APPLICATION OF WOUND VAC (N/A Abdomen)  Patient Location: ICU  Anesthesia Type:General  Level of Consciousness: Patient remains intubated per anesthesia plan  Airway & Oxygen Therapy: Patient remains intubated per anesthesia plan and Patient placed on Ventilator (see vital sign flow sheet for setting)  Post-op Assessment: Report given to RN and Post -op Vital signs reviewed and stable  Post vital signs: Reviewed and stable  Last Vitals:  Vitals Value Taken Time  BP    Temp    Pulse    Resp    SpO2      Last Pain:  Vitals:   09/08/17 0400  TempSrc: Oral  PainSc:          Complications: No apparent anesthesia complications

## 2017-09-08 NOTE — Progress Notes (Signed)
OT Cancellation Note  Patient Details Name: [name] MRN: [id number] DOB: 1951-04-21   Cancelled Treatment:    Reason Eval/Treat Not Completed: Medical issues which prohibited therapy;Patient not medically ready. Pt intubated. Will return as schedule allows. Thank you.  [place], OTR/L Acute Rehab Pager: [phone] Office: [phone] 09/08/2017, 7:41 AM

## 2017-09-08 NOTE — Progress Notes (Signed)
Pt transported on vent from OR to 2H02. Pt's vitals remained stable throughout the trip.

## 2017-09-09 ENCOUNTER — Encounter (HOSPITAL_COMMUNITY): Payer: Self-pay | Admitting: General Surgery

## 2017-09-09 ENCOUNTER — Inpatient Hospital Stay (HOSPITAL_COMMUNITY): Payer: PPO

## 2017-09-09 DIAGNOSIS — I10 Essential (primary) hypertension: Secondary | ICD-10-CM

## 2017-09-09 DIAGNOSIS — R109 Unspecified abdominal pain: Secondary | ICD-10-CM

## 2017-09-09 DIAGNOSIS — I708 Atherosclerosis of other arteries: Secondary | ICD-10-CM

## 2017-09-09 LAB — CBC WITH DIFFERENTIAL/PLATELET
Abs Immature Granulocytes: 0.1 10*3/uL (ref 0.0–0.1)
BASOS ABS: 0.1 10*3/uL (ref 0.0–0.1)
Basophils Relative: 0 %
EOS ABS: 0.2 10*3/uL (ref 0.0–0.7)
EOS PCT: 1 %
HEMATOCRIT: 27.8 % — AB (ref 39.0–52.0)
Hemoglobin: 9.2 g/dL — ABNORMAL LOW (ref 13.0–17.0)
Immature Granulocytes: 1 %
LYMPHS PCT: 6 %
Lymphs Abs: 0.7 10*3/uL (ref 0.7–4.0)
MCH: 30.3 pg (ref 26.0–34.0)
MCHC: 33.1 g/dL (ref 30.0–36.0)
MCV: 91.4 fL (ref 78.0–100.0)
MONO ABS: 0.7 10*3/uL (ref 0.1–1.0)
MONOS PCT: 7 %
NEUTROS ABS: 9.4 10*3/uL — AB (ref 1.7–7.7)
Neutrophils Relative %: 85 %
Platelets: 154 10*3/uL (ref 150–400)
RBC: 3.04 MIL/uL — AB (ref 4.22–5.81)
RDW: 14.1 % (ref 11.5–15.5)
WBC: 11.2 10*3/uL — AB (ref 4.0–10.5)

## 2017-09-09 LAB — BASIC METABOLIC PANEL
ANION GAP: 8 (ref 5–15)
Anion gap: 10 (ref 5–15)
BUN: 13 mg/dL (ref 6–20)
BUN: 14 mg/dL (ref 6–20)
CO2: 24 mmol/L (ref 22–32)
CO2: 26 mmol/L (ref 22–32)
CREATININE: 1.3 mg/dL — AB (ref 0.61–1.24)
Calcium: 6.3 mg/dL — CL (ref 8.9–10.3)
Calcium: 7 mg/dL — ABNORMAL LOW (ref 8.9–10.3)
Chloride: 105 mmol/L (ref 101–111)
Chloride: 102 mmol/L (ref 101–111)
Creatinine, Ser: 1.23 mg/dL (ref 0.61–1.24)
GFR calc Af Amer: >60 mL/min (ref >60)
GFR calc Af Amer: >60 mL/min (ref >60)
GFR calc non Af Amer: 55 mL/min — ABNORMAL LOW (ref >60)
GFR, EST NON AFRICAN AMERICAN: 59 mL/min — AB (ref >60)
GLUCOSE: 117 mg/dL — AB (ref 65–99)
GLUCOSE: 104 mg/dL — AB (ref 65–99)
POTASSIUM: 3.1 mmol/L — AB (ref 3.5–5.1)
Potassium: 3.3 mmol/L — ABNORMAL LOW (ref 3.5–5.1)
SODIUM: 137 mmol/L (ref 135–145)
Sodium: 138 mmol/L (ref 135–145)

## 2017-09-09 LAB — GLUCOSE, CAPILLARY
GLUCOSE-CAPILLARY: 104 mg/dL — AB (ref 65–99)
Glucose-Capillary: 134 mg/dL — ABNORMAL HIGH (ref 65–99)
Glucose-Capillary: 130 mg/dL — ABNORMAL HIGH (ref 65–99)
Glucose-Capillary: 83 mg/dL (ref 65–99)
Glucose-Capillary: 104 mg/dL — ABNORMAL HIGH (ref 65–99)

## 2017-09-09 LAB — COOXEMETRY PANEL
CARBOXYHEMOGLOBIN: 1 % (ref 0.5–1.5)
Methemoglobin: 1.2 % (ref 0.0–1.5)
O2 Saturation: 68.2 %
Total hemoglobin: 8.3 g/dL — ABNORMAL LOW (ref 12.0–16.0)

## 2017-09-09 LAB — MAGNESIUM
MAGNESIUM: 1.8 mg/dL (ref 1.7–2.4)
MAGNESIUM: 2.4 mg/dL (ref 1.7–2.4)

## 2017-09-09 LAB — PHOSPHORUS
PHOSPHORUS: 1.6 mg/dL — AB (ref 2.5–4.6)
PHOSPHORUS: 2.4 mg/dL — AB (ref 2.5–4.6)

## 2017-09-09 MED ORDER — FUROSEMIDE 10 MG/ML IJ SOLN
40.0000 mg | Freq: Four times a day (QID) | INTRAMUSCULAR | Status: AC
  Administered 2017-09-09 (×2): 40 mg via INTRAVENOUS
  Filled 2017-09-09 (×2): qty 4

## 2017-09-09 MED ORDER — SODIUM CHLORIDE 0.9 % IV BOLUS
500.0000 mL | Freq: Once | INTRAVENOUS | Status: AC
  Administered 2017-09-09: 500 mL via INTRAVENOUS

## 2017-09-09 MED ORDER — POTASSIUM PHOSPHATES 15 MMOLE/5ML IV SOLN
30.0000 mmol | Freq: Once | INTRAVENOUS | Status: AC
  Administered 2017-09-09: 30 mmol via INTRAVENOUS
  Filled 2017-09-09: qty 10

## 2017-09-09 MED ORDER — ALBUMIN HUMAN 25 % IV SOLN
50.0000 g | Freq: Once | INTRAVENOUS | Status: AC
  Administered 2017-09-09: 50 g via INTRAVENOUS
  Filled 2017-09-09: qty 50

## 2017-09-09 MED ORDER — POTASSIUM CHLORIDE 10 MEQ/100ML IV SOLN
10.0000 meq | INTRAVENOUS | Status: AC
  Administered 2017-09-09 (×4): 10 meq via INTRAVENOUS
  Filled 2017-09-09 (×4): qty 100

## 2017-09-09 MED ORDER — MAGNESIUM SULFATE 2 GM/50ML IV SOLN
2.0000 g | Freq: Once | INTRAVENOUS | Status: AC
  Administered 2017-09-09: 2 g via INTRAVENOUS
  Filled 2017-09-09: qty 50

## 2017-09-09 NOTE — Anesthesia Postprocedure Evaluation (Signed)
Anesthesia Post Note  Patient: Luke Kaufman  Procedure(s) Performed: EXPLORATORY LAPAROTOMY, ABDOMINAL WASHOUT, PARTIAL CLOSURE OF ABDOMEN (N/A Abdomen) APPLICATION OF WOUND VAC (N/A Abdomen)     Patient location during evaluation: SICU Anesthesia Type: General Level of consciousness: sedated Pain management: pain level controlled Vital Signs Assessment: post-procedure vital signs reviewed and stable Respiratory status: patient remains intubated per anesthesia plan Cardiovascular status: stable Postop Assessment: no apparent nausea or vomiting Anesthetic complications: no    Last Vitals:  Vitals:   09/09/17 0736 09/09/17 0800  BP: (!) 109/58 (!) 113/59  Pulse: (!) 104 84  Resp: (!) 22 (!) 22  Temp:  37.6 C  SpO2: 92% 93%    Last Pain:  Vitals:   09/09/17 0800  TempSrc: Oral  PainSc:     LLE Motor Response: (P) No movement to painful stimulus (09/09/17 0800)   RLE Motor Response: (P) No movement to painful stimulus (09/09/17 0800)        [place]

## 2017-09-09 NOTE — Progress Notes (Signed)
Nutrition Follow-up  DOCUMENTATION CODES:   Obesity unspecified  INTERVENTION:   As able recommend increasing Vital High Protein to goal rate of 55 ml/hr  30 ml Prostat TID  Provides: 1620 kcal, 160 grams protein, and 1103 ml free water  NUTRITION DIAGNOSIS:   Increased nutrient needs related to wound healing as evidenced by estimated needs. Ongoing.   GOAL:   Provide needs based on ASPEN/SCCM guidelines Progressing.   MONITOR:   TF tolerance, Skin  REASON FOR ASSESSMENT:   Consult Enteral/tube feeding initiation and management  ASSESSMENT:   Pt with PMH of HTN, active smoker (2 PPD), CAD, MI, stroke, PAD who was admitted 5/15 with severe epigastric and periumbilical pain found to have ischemic R colon. Pt s/p ex lap with R hemicolectomy 5/16.   5/16 mesenteric angiogram and aorto to SMA bypass 5/16 R hemicolectomy 5/18 ileostomy 5/20 abdominal washout and partial fascial closure 5/20 trickle TF started, Vital High Protein @ 10 ml/hr with 30 ml Prostat BID (provides: 440 kcal and 51 grams protein)    OR planned tomorrow, possible closure  Patient is currently intubated on ventilator support  Medications reviewed and include: colace, SSI, 2 g mag sulfate daily, 10 mEq KCl x 4  Levophed @ 14 mcg/min Labs reviewed: K+3.3 (L), PO4 2.4 (L) UOP: 650 ml VAC: 1150 ml 24 L positive  Diet Order:   Diet Order           Diet NPO time specified  Diet effective now          EDUCATION NEEDS:   No education needs have been identified at this time  Skin:  Skin Assessment: (open abdomen)  Last BM:  5/21 50 ml via ileostomy   Height:   Ht Readings from Last 1 Encounters:  09/06/17 5\' 7"  (1.702 m)    Weight:   Wt Readings from Last 1 Encounters:  09/09/17 290 lb 2 oz (131.6 kg)    Ideal Body Weight:  67.2 kg  BMI:  Body mass index is 45.44 kg/m.  Estimated Nutritional Needs:   Kcal:  1400-1700  Protein:  145-168 grams  Fluid:  > 2  L/day  Maylon Peppers RD, LDN, CNSC [phone] Pager [phone] After Hours Pager

## 2017-09-09 NOTE — Progress Notes (Signed)
CRITICAL VALUE ALERT  Critical Value:  Calcium  Date & Time Notied:  9390  Provider Notified: [phone]  Orders Received/Actions taken: see new orders

## 2017-09-09 NOTE — Progress Notes (Signed)
[facility] Progress Note Patient Name: Luke Kaufman DOB: 03-28-1951 MRN: [id number]   Date of Service  09/09/2017  HPI/Events of Note  K+ = 3.1, PO4--- = 1.6 and Creatinine = 1.23.   eICU Interventions  Will order: 1. Replace K+ and PO4---. 2. Repeat BMP and PO4--- level at 2 PM.      Intervention Category Major Interventions: Electrolyte abnormality - evaluation and management  Lajuanna Pompa Eugene 09/09/2017, 6:40 AM

## 2017-09-09 NOTE — Progress Notes (Signed)
PT Cancellation Note  Patient Details Name: Luke Kaufman MRN: [id number] DOB: [birth year]   Cancelled Treatment:    Reason Eval/Treat Not Completed: Patient not medically ready.  Pt remains intubated, sedated with open abdominal wound. Cancelled PT order. Please re-consult when appropriate in the future.  Kittie Plater, PT, DPT Pager #: [phone] Office #: [phone]    Berline Lopes 09/09/2017, 9:48 AM

## 2017-09-09 NOTE — Progress Notes (Signed)
PULMONARY / CRITICAL CARE MEDICINE   Name: Luke Kaufman MRN: [id number] DOB: 06/24/1950    ADMISSION DATE:  09/03/2017 CONSULTATION DATE:  5/16  REFERRING MD:  Oneida Alar  CHIEF COMPLAINT:  Post op Vent management  HISTORY OF PRESENT ILLNESS:   67 y/o male presented on 5/15 with abdominal pain, had an ischemic right colon.  Had a R hemicolectomy and eventually an ileostomy.    SUBJECTIVE:  Started tube feeding Still having some hypoxemia when turned  VITAL SIGNS: BP 120/62 (BP Location: Left Leg)   Pulse 87   Temp 99.6 F (37.6 C) (Oral)   Resp (!) 22   Ht 5\' 7"  (1.702 m)   Wt 131.6 kg (290 lb 2 oz)   SpO2 94%   BMI 45.44 kg/m   HEMODYNAMICS: CVP:  [10 mmHg-36 mmHg] 34 mmHg  VENTILATOR SETTINGS: Vent Mode: PRVC FiO2 (%):  [60 %-100 %] 60 % Set Rate:  [20 bmp] 20 bmp Vt Set:  [540 mL] 540 mL PEEP:  [8 cmH20] 8 cmH20 Pressure Support:  [5 cmH20] 5 cmH20 Plateau Pressure:  [14 cmH20-21 cmH20] 20 cmH20  INTAKE / OUTPUT: I/O last 3 completed shifts: In: 6792.7 [I.V.:4661.7; NG/GT:125.7; IV Piggyback:2005.3] Out: 2130 [QMVHQ:4696; Emesis/NG output:250; Drains:2000; Stool:250; Blood:50]  PHYSICAL EXAMINATION:  General:  In bed on vent HENT: NCAT ETT in place PULM: CTA B, vent supported breathing CV: RRR, no mgr GI: no bowel sounds, wound vac in place MSK: normal bulk and tone Neuro: sedated on vent    LABS:  BMET Recent Labs  Lab 09/07/17 1031 09/08/17 0423 09/08/17 0842 09/09/17 0355  NA 137 135 136 137  K 3.1* 3.1* 3.1* 3.1*  CL 98* 99*  --  105  CO2 31 27  --  24  BUN 14 12  --  13  CREATININE 1.57* 1.25*  --  1.23  GLUCOSE 151* 133*  --  117*    Electrolytes Recent Labs  Lab 09/07/17 1031 09/08/17 0423 09/08/17 1812 09/09/17 0355  CALCIUM 6.3* 6.7*  --  6.3*  MG  --  2.0 1.8 1.8  PHOS  --  1.9* 1.9* 1.6*    CBC Recent Labs  Lab 09/07/17 0404 09/08/17 0423 09/08/17 0842 09/09/17 0355  WBC 12.2* 11.3*  --  11.2*  HGB 10.6*  10.2* 8.2* 9.2*  HCT 30.9* 29.9* 24.0* 27.8*  PLT 156 163  --  154    Coag's Recent Labs  Lab 09/04/17 1036  APTT 56*  INR 1.60    Sepsis Markers Recent Labs  Lab 09/04/17 0126 09/04/17 1624 09/05/17 0750 09/05/17 1021 09/06/17 0630  LATICACIDVEN 3.20* 1.7  --  1.4  --   PROCALCITON  --   --  3.22  --  2.10    ABG Recent Labs  Lab 09/07/17 1041 09/08/17 0210 09/08/17 0842  PHART 7.434 7.506* 7.423  PCO2ART 46.1 40.2 39.6  PO2ART 66.0* 79.0* 89.0    Liver Enzymes Recent Labs  Lab 09/03/17 1644 09/05/17 0750  AST 25 376*  ALT 16* 313*  ALKPHOS 104 47  BILITOT 0.6 0.8  ALBUMIN 4.0 2.4*    Cardiac Enzymes Recent Labs  Lab 09/05/17 1352 09/05/17 2035 09/06/17 0148  TROPONINI 0.36* 0.27* 0.29*    Glucose Recent Labs  Lab 09/08/17 1142 09/08/17 1627 09/08/17 1927 09/08/17 2334 09/09/17 0333 09/09/17 0737  GLUCAP 139* 128* 119* 140* 134* 130*    Imaging Dg Chest Port 1 View  Result Date: 09/09/2017 CLINICAL DATA:  Ventilator dependence. EXAM:  PORTABLE CHEST 1 VIEW COMPARISON:  09/07/2017 FINDINGS: 0532 hours. Endotracheal tube tip is 3.8 cm above the base of the carina. Right IJ central line tip overlies the expected location of the mid SVC. There is bibasilar collapse/consolidation with small bilateral pleural effusions. The cardiopericardial silhouette is within normal limits for size. The visualized bony structures of the thorax are intact. Telemetry leads overlie the chest. IMPRESSION: 1. Rotated film with bibasilar collapse/consolidation and pleural effusions. No substantial interval change. Electronically Signed   By: Misty Stanley M.D.   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 As of 5/20, fascia is partially open.  ASSESSMENT / PLAN:  PULMONARY A: Acute respiratory failure with hypoxemia Acute pulmonary edema P:   Full mechanical vent support VAP prevention No weaning until after abdomen closed Diurese today  CARDIOVASCULAR A:  Shock: presumably sedation related as rapidly improving 5/20 Albumin today Don't give more fluid boluses P:  Tele Stop vaopressin Continue levophed, wean for MAP > 65 Albumin x1 dose today Levophed given diffuse anasarca Send coox > we don't have an echo  RENAL A:   Hypokalemia AKI> improved P:   KVO fluids Monitor BMET and UOP Replace electrolytes as needed Diuresis today  GASTROINTESTINAL A:    Ischemic colon, s/p hemicolectomy and ileostomy Abdomen still open, partially closed on 5/20 P:   Continue trickle feeding, hold tonight for OR tomorrow Continue famotidine To OR tomorrow  HEMATOLOGIC A:   Anemia without bleeding P:  Monitor for bleeding  INFECTIOUS A:   Ischemic bowel, s/p abdominal washout several times, appeared clear on 5/20 E.coli HCAP P:   Continue ceftriaxone 5 days  ENDOCRINE A:    Mild hyperglycemia P:   SSI  NEUROLOGIC A:   Sedation needs for vent synchrony P:   RASS goal Continue fentanyl infusion per PAD protocol, intermittent versed   FAMILY  - Updates: family updated by phone on 5/20  - Inter-disciplinary family meet or Palliative Care meeting due by:  day 7  My CC time 31 minutes  Roselie Awkward, MD [facility] Pager: [phone] Cell: [phone] After 3pm or if no response, call [phone]   09/09/2017, 10:21  AM

## 2017-09-09 NOTE — Progress Notes (Signed)
OT Cancellation Note  Patient Details Name: Delrico Minehart MRN: [id number] DOB: [birth year]   Cancelled Treatment:    Reason Eval/Treat Not Completed: Medical issues which prohibited therapy;Patient not medically ready. Pt intubated, sedated, and on pressors. Will return when pt medically ready. Thank you.   [place], OTR/L Acute Rehab Pager: [phone] Office: [phone] 09/09/2017, 9:43 AM

## 2017-09-09 NOTE — Progress Notes (Signed)
[facility] Progress Note Patient Name: Luke Kaufman DOB: [birth year] MRN: [id number]   Date of Service  09/09/2017  HPI/Events of Note  Oliguria - Urine output for this shift = 115 mL. CVP = 17 (?)  eICU Interventions  Will bolus with 0.9 NaCl 500 mL IV over 30 minutes now.      Intervention Category Intermediate Interventions: Oliguria - evaluation and management  Sommer,Steven Eugene 09/09/2017, 1:00 AM

## 2017-09-09 NOTE — Progress Notes (Signed)
Patient sedated and still on pressors.  His abdomen appears as though the temproary fascial sutures have probably come apart.  There is some bowel under the dressing laterally.  Will plan on taking back to the OR tomorrow.  May need to resort to more creative measures for closure.  Kathryne Eriksson. Dahlia Bailiff, MD, [place] [phone] [facility]

## 2017-09-10 ENCOUNTER — Encounter (HOSPITAL_COMMUNITY)
Admission: EM | Disposition: A | Discharge status: Nursing Home (Includes ICF) | Payer: Self-pay | Source: Home / Self Care | Attending: Pulmonary Disease

## 2017-09-10 ENCOUNTER — Inpatient Hospital Stay (HOSPITAL_COMMUNITY): Payer: PPO | Admitting: Anesthesiology

## 2017-09-10 ENCOUNTER — Encounter (HOSPITAL_COMMUNITY): Payer: Self-pay | Admitting: Certified Registered Nurse Anesthetist

## 2017-09-10 ENCOUNTER — Inpatient Hospital Stay (HOSPITAL_COMMUNITY): Payer: PPO

## 2017-09-10 DIAGNOSIS — K55069 Acute infarction of intestine, part and extent unspecified: Secondary | ICD-10-CM

## 2017-09-10 DIAGNOSIS — Z481 Encounter for planned postprocedural wound closure: Secondary | ICD-10-CM | POA: Diagnosis not present

## 2017-09-10 DIAGNOSIS — I10 Essential (primary) hypertension: Secondary | ICD-10-CM | POA: Diagnosis not present

## 2017-09-10 DIAGNOSIS — I739 Peripheral vascular disease, unspecified: Secondary | ICD-10-CM | POA: Diagnosis not present

## 2017-09-10 DIAGNOSIS — I639 Cerebral infarction, unspecified: Secondary | ICD-10-CM | POA: Diagnosis not present

## 2017-09-10 HISTORY — PX: LAPAROTOMY: SHX154

## 2017-09-10 LAB — POCT I-STAT 3, ART BLOOD GAS (G3+)
ACID-BASE EXCESS: 1 mmol/L (ref 0.0–2.0)
Bicarbonate: 25.5 mmol/L (ref 20.0–28.0)
O2 SAT: 99 %
PCO2 ART: 40.3 mmHg (ref 32.0–48.0)
PO2 ART: 119 mmHg — AB (ref 83.0–108.0)
Patient temperature: 99.8
TCO2: 27 mmol/L (ref 22–32)
pH, Arterial: 7.412 (ref 7.350–7.450)

## 2017-09-10 LAB — CBC WITH DIFFERENTIAL/PLATELET
Abs Immature Granulocytes: 0.1 10*3/uL (ref 0.0–0.1)
BASOS ABS: 0 10*3/uL (ref 0.0–0.1)
Basophils Relative: 0 %
EOS ABS: 0.2 10*3/uL (ref 0.0–0.7)
EOS PCT: 2 %
HEMATOCRIT: 28.3 % — AB (ref 39.0–52.0)
Hemoglobin: 9.2 g/dL — ABNORMAL LOW (ref 13.0–17.0)
Immature Granulocytes: 1 %
Lymphocytes Relative: 9 %
Lymphs Abs: 0.9 10*3/uL (ref 0.7–4.0)
MCH: 30 pg (ref 26.0–34.0)
MCHC: 32.5 g/dL (ref 30.0–36.0)
MCV: 92.2 fL (ref 78.0–100.0)
MONO ABS: 0.7 10*3/uL (ref 0.1–1.0)
Monocytes Relative: 6 %
Neutro Abs: 8.4 10*3/uL — ABNORMAL HIGH (ref 1.7–7.7)
Neutrophils Relative %: 82 %
Platelets: 175 10*3/uL (ref 150–400)
RBC: 3.07 MIL/uL — AB (ref 4.22–5.81)
RDW: 14.3 % (ref 11.5–15.5)
WBC: 10.3 10*3/uL (ref 4.0–10.5)

## 2017-09-10 LAB — GLUCOSE, CAPILLARY
GLUCOSE-CAPILLARY: 93 mg/dL (ref 65–99)
GLUCOSE-CAPILLARY: 105 mg/dL — AB (ref 65–99)
Glucose-Capillary: 104 mg/dL — ABNORMAL HIGH (ref 65–99)
Glucose-Capillary: 108 mg/dL — ABNORMAL HIGH (ref 65–99)
Glucose-Capillary: 108 mg/dL — ABNORMAL HIGH (ref 65–99)
Glucose-Capillary: 125 mg/dL — ABNORMAL HIGH (ref 65–99)
Glucose-Capillary: 95 mg/dL (ref 65–99)

## 2017-09-10 LAB — BASIC METABOLIC PANEL
ANION GAP: 9 (ref 5–15)
BUN: 14 mg/dL (ref 6–20)
CALCIUM: 7 mg/dL — AB (ref 8.9–10.3)
CO2: 26 mmol/L (ref 22–32)
CREATININE: 1.44 mg/dL — AB (ref 0.61–1.24)
Chloride: 105 mmol/L (ref 101–111)
GFR, EST AFRICAN AMERICAN: 57 mL/min — AB (ref >60)
GFR, EST NON AFRICAN AMERICAN: 49 mL/min — AB (ref >60)
Glucose, Bld: 100 mg/dL — ABNORMAL HIGH (ref 65–99)
POTASSIUM: 3.3 mmol/L — AB (ref 3.5–5.1)
Sodium: 140 mmol/L (ref 135–145)

## 2017-09-10 LAB — CULTURE, BLOOD (ROUTINE X 2)
CULTURE: NO GROWTH
Culture: NO GROWTH
SPECIAL REQUESTS: ADEQUATE
SPECIAL REQUESTS: ADEQUATE

## 2017-09-10 LAB — CG4 I-STAT (LACTIC ACID): Lactic Acid, Venous: 2.11 mmol/L (ref 0.5–1.9)

## 2017-09-10 LAB — MAGNESIUM: Magnesium: 2.1 mg/dL (ref 1.7–2.4)

## 2017-09-10 LAB — PHOSPHORUS: PHOSPHORUS: 2.7 mg/dL (ref 2.5–4.6)

## 2017-09-10 SURGERY — LAPAROTOMY, EXPLORATORY
Anesthesia: General

## 2017-09-10 MED ORDER — POTASSIUM CHLORIDE 10 MEQ/50ML IV SOLN
10.0000 meq | INTRAVENOUS | Status: AC
  Administered 2017-09-10 (×4): 10 meq via INTRAVENOUS
  Filled 2017-09-10 (×4): qty 50

## 2017-09-10 MED ORDER — FENTANYL CITRATE (PF) 100 MCG/2ML IJ SOLN
INTRAMUSCULAR | Status: DC | PRN
  Administered 2017-09-10: 50 ug via INTRAVENOUS
  Administered 2017-09-10: 100 ug via INTRAVENOUS
  Administered 2017-09-10 (×2): 50 ug via INTRAVENOUS

## 2017-09-10 MED ORDER — SODIUM CHLORIDE 0.9 % IV SOLN
INTRAVENOUS | Status: DC | PRN
  Administered 2017-09-10: 17:00:00 via INTRAVENOUS

## 2017-09-10 MED ORDER — ROCURONIUM BROMIDE 100 MG/10ML IV SOLN
INTRAVENOUS | Status: DC | PRN
  Administered 2017-09-10: 50 mg via INTRAVENOUS

## 2017-09-10 MED ORDER — FENTANYL CITRATE (PF) 250 MCG/5ML IJ SOLN
INTRAMUSCULAR | Status: AC
  Filled 2017-09-10: qty 5

## 2017-09-10 MED ORDER — PROPOFOL 10 MG/ML IV BOLUS
INTRAVENOUS | Status: AC
  Filled 2017-09-10: qty 20

## 2017-09-10 MED ORDER — 0.9 % SODIUM CHLORIDE (POUR BTL) OPTIME
TOPICAL | Status: DC | PRN
  Administered 2017-09-10: 1000 mL

## 2017-09-10 MED ORDER — FUROSEMIDE 10 MG/ML IJ SOLN
40.0000 mg | Freq: Two times a day (BID) | INTRAMUSCULAR | Status: AC
  Administered 2017-09-10 (×2): 40 mg via INTRAVENOUS
  Filled 2017-09-10 (×2): qty 4

## 2017-09-10 SURGICAL SUPPLY — 39 items
BLADE CLIPPER SURG (BLADE) IMPLANT
CANISTER SUCT 3000ML PPV (MISCELLANEOUS) ×3 IMPLANT
CHLORAPREP W/TINT 26ML (MISCELLANEOUS) ×3 IMPLANT
COVER SURGICAL LIGHT HANDLE (MISCELLANEOUS) ×3 IMPLANT
DRAPE LAPAROSCOPIC ABDOMINAL (DRAPES) ×3 IMPLANT
DRAPE WARM FLUID 44X44 (DRAPE) ×3 IMPLANT
DRSG OPSITE POSTOP 4X10 (GAUZE/BANDAGES/DRESSINGS) IMPLANT
DRSG OPSITE POSTOP 4X8 (GAUZE/BANDAGES/DRESSINGS) IMPLANT
ELECT BLADE 6.5 EXT (BLADE) IMPLANT
ELECT CAUTERY BLADE 6.4 (BLADE) ×3 IMPLANT
ELECT REM PT RETURN 9FT ADLT (ELECTROSURGICAL) ×3
ELECTRODE REM PT RTRN 9FT ADLT (ELECTROSURGICAL) ×1 IMPLANT
GLOVE BIOGEL PI IND STRL 8 (GLOVE) ×1 IMPLANT
GLOVE BIOGEL PI INDICATOR 8 (GLOVE) ×2
GLOVE ECLIPSE 7.5 STRL STRAW (GLOVE) ×3 IMPLANT
GOWN STRL REUS W/ TWL LRG LVL3 (GOWN DISPOSABLE) ×2 IMPLANT
GOWN STRL REUS W/TWL LRG LVL3 (GOWN DISPOSABLE) ×4
KIT BASIN OR (CUSTOM PROCEDURE TRAY) ×3 IMPLANT
KIT OSTOMY DRAINABLE 2.75 STR (WOUND CARE) ×3 IMPLANT
KIT TURNOVER KIT B (KITS) ×3 IMPLANT
LIGASURE IMPACT 36 18CM CVD LR (INSTRUMENTS) IMPLANT
NS IRRIG 1000ML POUR BTL (IV SOLUTION) ×6 IMPLANT
PACK GENERAL/GYN (CUSTOM PROCEDURE TRAY) ×3 IMPLANT
PAD ARMBOARD 7.5X6 YLW CONV (MISCELLANEOUS) ×3 IMPLANT
SEPRAFILM PROCEDURAL PACK 3X5 (MISCELLANEOUS) IMPLANT
SPECIMEN JAR LARGE (MISCELLANEOUS) IMPLANT
SPONGE ABD ABTHERA ADVANCE (MISCELLANEOUS) ×3 IMPLANT
SPONGE LAP 18X18 X RAY DECT (DISPOSABLE) IMPLANT
STAPLER VISISTAT 35W (STAPLE) ×3 IMPLANT
SUCTION POOLE TIP (SUCTIONS) ×3 IMPLANT
SUT NOVA 1 T20/GS 25DT (SUTURE) IMPLANT
SUT PDS AB 1 TP1 96 (SUTURE) ×6 IMPLANT
SUT SILK 2 0 SH CR/8 (SUTURE) ×3 IMPLANT
SUT SILK 2 0 TIES 10X30 (SUTURE) ×3 IMPLANT
SUT SILK 3 0 SH CR/8 (SUTURE) ×3 IMPLANT
SUT SILK 3 0 TIES 10X30 (SUTURE) ×3 IMPLANT
TOWEL OR 17X26 10 PK STRL BLUE (TOWEL DISPOSABLE) ×3 IMPLANT
TRAY FOLEY MTR SLVR 16FR STAT (SET/KITS/TRAYS/PACK) IMPLANT
YANKAUER SUCT BULB TIP NO VENT (SUCTIONS) IMPLANT

## 2017-09-10 NOTE — Progress Notes (Addendum)
[facility]  Subjective  - Intubated and sedated.   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  Plan to return to OR today with General surgery for bowel exploration    Roxy Horseman 09/10/2017 7:44 AM --  Laboratory Lab Results: Recent Labs    09/09/17 0355 09/10/17 0500  WBC 11.2* 10.3  HGB 9.2* 9.2*  HCT 27.8* 28.3*  PLT 154 175   BMET Recent Labs    09/09/17 1320 09/10/17 0500  NA 138 140  K 3.3* 3.3*  CL 102 105  CO2 26 26  GLUCOSE 104* 100*  BUN 14 14  CREATININE 1.30* 1.44*  CALCIUM 7.0* 7.0*    COAG Lab Results  Component Value Date   INR 1.60 09/04/2017   INR 1.20 08/09/2014   INR 1.02 05/16/2013   No results found for: PTT  I have examined the patient, reviewed and agree with above.  Remains critically ill.  Had return to operating room today for washout.  Not able to closed.  No evidence of necrotic bowel.  Nothing to add.  Following with you Curt Jews, MD 09/10/2017 6:57 PM

## 2017-09-10 NOTE — Progress Notes (Signed)
OT Cancellation Note  Patient Details Name: Luke Kaufman MRN: [id number] DOB: June 15, 1950   Cancelled Treatment:    Reason Eval/Treat Not Completed: Medical issues which prohibited therapy;Patient not medically ready. Pt remains intubated and not medically ready for therapy. Will sign off. Please re-consult when appropriate for therapies. Thank you.  [place], OTR/L Acute Rehab Pager: [phone] Office: [phone] 09/10/2017, 2:25 PM

## 2017-09-10 NOTE — Progress Notes (Signed)
[facility] Progress Note 2 Days Post-Op  Subjective: Patient still on the ventilator.  Will not follow. Commands because he is too heavily sedated.  Objective: Vital signs in last 24 hours: Temp:  [99.1 F (37.3 C)-100.6 F (38.1 C)] 99.8 F (37.7 C) (05/22 0730) Pulse Rate:  [70-122] 85 (05/22 0700) Resp:  [20-22] 20 (05/22 0707) BP: (96-135)/(47-71) 96/47 (05/22 0707) SpO2:  [91 %-98 %] 95 % (05/22 0721) Arterial Line BP: (84-136)/(44-75) 98/48 (05/22 0700) FiO2 (%):  [60 %-100 %] 100 % (05/22 0721) Weight:  [129.6 kg (285 lb 11.5 oz)] 129.6 kg (285 lb 11.5 oz) (05/22 0500) Last BM Date: 09/09/17  Intake/Output from previous day: 05/21 0701 - 05/22 0700 In: 2228.9 [I.V.:1018.9; NG/GT:360; IV Piggyback:850] Out: 6205 [Urine:4855; Drains:1300; Stool:50] Intake/Output this shift: No intake/output data recorded.  General: No acute distress, but sedated  Lungs: Clear.  On the ventilator and not much success in weaning  Abd: distended, bowel starting to peek out from side of VAC  Extremities: No changes  Neuro: Sedated  Lab Results:  @LABLAST2 (wbc:2,hgb:2,hct:2,plt:2) BMET ) Recent Labs    09/09/17 1320 09/10/17 0500  NA 138 140  K 3.3* 3.3*  CL 102 105  CO2 26 26  GLUCOSE 104* 100*  BUN 14 14  CREATININE 1.30* 1.44*  CALCIUM 7.0* 7.0*   PT/INR No results for input(s): LABPROT, INR in the last 72 hours. ABG Recent Labs    09/08/17 0210 09/08/17 0842  PHART 7.506* 7.423  HCO3 31.7* 25.8    Studies/Results: Dg Chest Port 1 View  Result Date: 09/09/2017 CLINICAL DATA:  Ventilator dependence. EXAM: PORTABLE CHEST 1 VIEW COMPARISON:  09/07/2017 FINDINGS: 0532 hours. Endotracheal tube tip is 3.8 cm above the base of the carina. Right IJ central line tip overlies the expected location of the mid SVC. There is bibasilar collapse/consolidation with small bilateral pleural effusions. The cardiopericardial silhouette is within normal limits for size. The visualized  bony structures of the thorax are intact. Telemetry leads overlie the chest. IMPRESSION: 1. Rotated film with bibasilar collapse/consolidation and pleural effusions. No substantial interval change. Electronically Signed   By: Misty Stanley M.D.   On: 09/09/2017 09:17    Anti-infectives: Anti-infectives (From admission, onward)   Start     Dose/Rate Route Frequency Ordered Stop   09/07/17 1545  cefTRIAXone (ROCEPHIN) 2 g in sodium chloride 0.9 % 100 mL IVPB     2 g 200 mL/hr over 30 Minutes Intravenous Every 24 hours 09/07/17 1543 09/12/17 1544   09/04/17 1400  piperacillin-tazobactam (ZOSYN) IVPB 3.375 g  Status:  Discontinued     3.375 g 12.5 mL/hr over 240 Minutes Intravenous Every 8 hours 09/04/17 1157 09/07/17 1541   09/04/17 1100  ceFAZolin (ANCEF) IVPB 2g/100 mL premix  Status:  Discontinued     2 g 200 mL/hr over 30 Minutes Intravenous Every 8 hours 09/04/17 1035 09/04/17 1157   09/04/17 0630  piperacillin-tazobactam (ZOSYN) IVPB 3.375 g  Status:  Discontinued     3.375 g 100 mL/hr over 30 Minutes Intravenous  Once 09/04/17 0617 09/04/17 0626   09/04/17 0619  piperacillin-tazobactam (ZOSYN) IVPB 3.375 g     3.375 g 100 mL/hr over 30 Minutes Intravenous 60 min pre-op 09/04/17 0620 09/04/17 0717   09/04/17 0030  piperacillin-tazobactam (ZOSYN) IVPB 3.375 g     3.375 g 100 mL/hr over 30 Minutes Intravenous  Once 09/04/17 0024 09/04/17 0144      Assessment/Plan: s/p Procedure(s): EXPLORATORY LAPAROTOMY, ABDOMINAL WASHOUT, PARTIAL CLOSURE OF  ABDOMEN APPLICATION OF WOUND VAC OR later today for likely VAC replacement.  LOS: 6 days   Kathryne Eriksson. Dahlia Bailiff, MD, FACS [phone] [facility] 09/10/2017

## 2017-09-10 NOTE — Progress Notes (Signed)
Tube feeding was not turned off last night (5/21) @ 10PM per order.  Just now off at Luke Kaufman

## 2017-09-10 NOTE — Op Note (Signed)
OPERATIVE REPORT  DATE OF OPERATION: 09/10/2017  PATIENT:  Luke Kaufman  67 y.o. male  PRE-OPERATIVE DIAGNOSIS:  OPEN ABDOMINAL WOUND WITN NEGATIVE PRESSURE WOUND DRESSING IN PLACE  POST-OPERATIVE DIAGNOSIS:  SAME  INDICATION(S) FOR OPERATION: Patient has an open abdomen with a negative pressure wound dressing in place.  FINDINGS: The previous sutures that have been put in place in the upper lower portion of the fascia had broken and only 1 of the superior sutures remain in place and 2 of the inferior sutures were in place.  PROCEDURE:  Procedure(s): EXPLORATORY LAPAROTOMY WITH POSSIBLE WOUND CLOSURE ABDOMEN  SURGEON:  Surgeon(s): Judeth Horn, MD  ASSISTANT: None  ANESTHESIA:   general  COMPLICATIONS:  None  EBL: 0 ml  BLOOD ADMINISTERED: none  DRAINS: Nasogastric Tube, Urinary Catheter (Foley) and Negative pressure wound dressing   SPECIMEN:  No Specimen  COUNTS CORRECT:  YES  PROCEDURE DETAILS: The patient was taken to the operating room and placed on the table in supine position.  He was intubated from the intensive care unit therefore only required inhalation anesthetic for the procedure.  A proper timeout was performed identifying the patient and the procedure be performed.  We removed the outer portion of his negative pressure wound dressing and also the inner portion which was adhered to the outer portion significantly.  Part of the bowel had eviscerated around the internal portion of the negative pressure wound dressing and was somewhat adherent to the outer plastic.  This did not create any injury or fistulization however needs to be avoided in the future.  Was noted that the sutures in the upper and the lower portion of the wound had broken come out.  Therefore the patient's abdomen was pretty much at the state where was on the original operation on Monday.  We irrigated all 4 quadrants with saline.  Once this was done the negative pressure wound dressing was  reapplied with the inner sheath with foam placed inside and underneath the fascia followed by 2 external foam sheets.  The stoma in the right lower quadrant was covered by plastic also underwent caudal hole in it for the stoma device.  He has excellent vacuum from the negative pressure wound dressing.  All needle counts, sponge counts and instrument counts were correct.  PATIENT DISPOSITION:  ICU - intubated and critically ill.   Judeth Horn 5/22/20195:30 PM

## 2017-09-10 NOTE — Anesthesia Preprocedure Evaluation (Addendum)
Anesthesia Evaluation  Patient identified by MRN, date of birth, ID band Patient unresponsive    Reviewed: Allergy & Precautions, NPO status , Patient's Chart, lab work & pertinent test results, Unable to perform ROS - Chart review only  Airway Mallampati: Intubated  TM Distance: >3 FB     Dental   Pulmonary Current Smoker,     + decreased breath sounds      Cardiovascular hypertension, + Past MI and + Peripheral Vascular Disease   Rhythm:Regular Rate:Normal     Neuro/Psych CVA    GI/Hepatic PUD,   Endo/Other    Renal/GU Renal diseaseRenal artery stenosis     Musculoskeletal   Abdominal   Peds  Hematology   Anesthesia Other Findings   Reproductive/Obstetrics                           Anesthesia Physical Anesthesia Plan  ASA: III  Anesthesia Plan: General   Post-op Pain Management:    Induction: Intravenous  PONV Risk Score and Plan: Ondansetron and Dexamethasone  Airway Management Planned: Oral ETT  Additional Equipment:   Intra-op Plan:   Post-operative Plan: Post-operative intubation/ventilation  Informed Consent: I have reviewed the patients History and Physical, chart, labs and discussed the procedure including the risks, benefits and alternatives for the proposed anesthesia with the patient or authorized representative who has indicated his/her understanding and acceptance.     Plan Discussed with: CRNA and Anesthesiologist  Anesthesia Plan Comments:        Anesthesia Quick Evaluation

## 2017-09-10 NOTE — Transfer of Care (Signed)
Immediate Anesthesia Transfer of Care Note  Patient: Luke Kaufman  Procedure(s) Performed: EXPLORATORY LAPAROTOMY WITH POSSIBLE WOUND CLOSURE ABDOMEN (N/A )  Patient Location: ICU  Anesthesia Type:General  Level of Consciousness: sedated and Patient remains intubated per anesthesia plan  Airway & Oxygen Therapy: Patient remains intubated per anesthesia plan and Patient placed on Ventilator (see vital sign flow sheet for setting)  Post-op Assessment: Report given to RN and Post -op Vital signs reviewed and stable  Post vital signs: Reviewed and stable  Last Vitals:  Vitals Value Taken Time  BP    Temp    Pulse    Resp    SpO2      Last Pain:  Vitals:   09/10/17 1617  TempSrc: Oral  PainSc:          Complications: No apparent anesthesia complications

## 2017-09-10 NOTE — Anesthesia Postprocedure Evaluation (Signed)
Anesthesia Post Note  Patient: Johnluke Haugen  Procedure(s) Performed: EXPLORATORY LAPAROTOMY WITH POSSIBLE WOUND CLOSURE ABDOMEN (N/A )     Patient location during evaluation: SICU Anesthesia Type: General Level of consciousness: sedated and patient remains intubated per anesthesia plan Pain management: pain level controlled Vital Signs Assessment: post-procedure vital signs reviewed and stable Respiratory status: patient remains intubated per anesthesia plan and patient on ventilator - see flowsheet for VS Cardiovascular status: stable Postop Assessment: no apparent nausea or vomiting Anesthetic complications: no    Last Vitals:  Vitals:   09/10/17 1617 09/10/17 1753  BP:    Pulse:  (!) 107  Resp:  20  Temp: 37.8 C   SpO2:  92%    Last Pain:  Vitals:   09/10/17 1617  TempSrc: Oral  PainSc:                  Jafet Wissing COKER

## 2017-09-10 NOTE — Progress Notes (Signed)
Per RN report ealier today Lenor Coffin RN), plans for femoral a-line to be d/c s/p OR.  RN reports that a-line pressure bag does not need to be replaced.

## 2017-09-10 NOTE — Progress Notes (Addendum)
PULMONARY / CRITICAL CARE MEDICINE   Name: Margarita Bobrowski MRN: [id number] DOB: 1951/04/16    ADMISSION DATE:  09/03/2017 CONSULTATION DATE:  5/16  REFERRING MD:  Oneida Alar  CHIEF COMPLAINT:  Post op Vent management  HISTORY OF PRESENT ILLNESS:   67 y/o male presented on 5/15 with abdominal pain, had an ischemic right colon.  Had a R hemicolectomy and eventually an ileostomy.    SUBJECTIVE:  RN reports episodes of desaturation over night, fiO2 increased to 100%, PEEP 8.  Planned return to OR for exploration / possible closure.  I/O net neg 3.9L in last 24 hours, remains positive balance for admit.    VITAL SIGNS: BP (!) 115/58 (BP Location: Left Leg)   Pulse 85   Temp 99.8 F (37.7 C) (Oral)   Resp 20   Ht 5\' 7"  (1.702 m)   Wt 285 lb 11.5 oz (129.6 kg)   SpO2 97%   BMI 44.75 kg/m   HEMODYNAMICS: CVP:  [3 mmHg-34 mmHg] 5 mmHg  VENTILATOR SETTINGS: Vent Mode: PRVC FiO2 (%):  [60 %-100 %] 100 % Set Rate:  [20 bmp] 20 bmp Vt Set:  [540 mL] 540 mL PEEP:  [8 cmH20] 8 cmH20 Plateau Pressure:  [14 cmH20-21 cmH20] 19 cmH20  INTAKE / OUTPUT: I/O last 3 completed shifts: In: 2979 [I.V.:2149.7; NG/GT:480; IV Piggyback:2055.3] Out: 7100 [Urine:5100; Drains:1950; Stool:50]  PHYSICAL EXAMINATION: General:  Obese male in NAD on vent HEENT: MM pink/moist, ETT, pupils 25mm=/briskly reactive, scleral icterus Neuro: sedate, no response to verbal stimuli CV: s1s2 rrr, no m/r/g PULM: even/non-labored, lungs bilaterally clear anterior, diminished lateral/bases  GI: soft, midline open incision with wound VAC in place, RLQ colostomy Extremities: warm/dry, 2-3+ pitting edema in BLE   Skin: no rashes or lesions Lines: R femoral aline c/d/i, R [place] TLC clean, unable to visualize insertion site due to biopatch placement   LABS:  BMET Recent Labs  Lab 09/09/17 0355 09/09/17 1320 09/10/17 0500  NA 137 138 140  K 3.1* 3.3* 3.3*  CL 105 102 105  CO2 24 26 26   BUN 13 14 14   CREATININE  1.23 1.30* 1.44*  GLUCOSE 117* 104* 100*    Electrolytes Recent Labs  Lab 09/09/17 0355 09/09/17 1320 09/10/17 0500  CALCIUM 6.3* 7.0* 7.0*  MG 1.8 2.4 2.1  PHOS 1.6* 2.4* 2.7    CBC Recent Labs  Lab 09/08/17 0423 09/08/17 0842 09/09/17 0355 09/10/17 0500  WBC 11.3*  --  11.2* 10.3  HGB 10.2* 8.2* 9.2* 9.2*  HCT 29.9* 24.0* 27.8* 28.3*  PLT 163  --  154 175    Coag's Recent Labs  Lab 09/04/17 1036  APTT 56*  INR 1.60    Sepsis Markers Recent Labs  Lab 09/04/17 0126 09/04/17 1624 09/05/17 0750 09/05/17 1021 09/06/17 0630  LATICACIDVEN 3.20* 1.7  --  1.4  --   PROCALCITON  --   --  3.22  --  2.10    ABG Recent Labs  Lab 09/07/17 1041 09/08/17 0210 09/08/17 0842  PHART 7.434 7.506* 7.423  PCO2ART 46.1 40.2 39.6  PO2ART 66.0* 79.0* 89.0    Liver Enzymes Recent Labs  Lab 09/03/17 1644 09/05/17 0750  AST 25 376*  ALT 16* 313*  ALKPHOS 104 47  BILITOT 0.6 0.8  ALBUMIN 4.0 2.4*    Cardiac Enzymes Recent Labs  Lab 09/05/17 1352 09/05/17 2035 09/06/17 0148  TROPONINI 0.36* 0.27* 0.29*    Glucose Recent Labs  Lab 09/09/17 0737 09/09/17 1137 09/09/17 1641 09/09/17  1941 09/10/17 0429 09/10/17 0727  GLUCAP 130* 83 104* 104* 104* 108*    Imaging Dg Chest Port 1 View  Result Date: 09/10/2017 CLINICAL DATA:  Acute respiratory failure EXAM: PORTABLE CHEST 1 VIEW COMPARISON:  09/09/2017 FINDINGS: Endotracheal tube nasogastric catheter and right-sided subclavian central line are again seen and stable. Cardiac shadow is stable. Bilateral pleural effusions right greater than left are seen stable in appearance from the prior exam. Bibasilar changes are again noted and stable. No bony abnormality is noted. IMPRESSION: Bibasilar changes with associated effusions right greater than left. Electronically Signed   By: Inez Catalina M.D.   On: 09/10/2017 08:01     STUDIES:  5/15 CT ab/pelvis > portal venous gas in liver, sigmoid colon with  diverticular disease, atrophic left kidney  CULTURES: 5/17 resp > e coli resistant to multiple antibiotics 5/17 blood >    ANTIBIOTICS: 5/15 zosyn > 5/19  5/19 ceftriaxone >   SIGNIFICANT EVENTS: 5/16 mesenteric angiogram and aorto to SMA bypass 5/16 R hemicolectomy 5/18 ileostomy 5/19 abdominal washout and partial fascial closure 5/21 TF started, hypoxia with turning  5/22 Return to OR for exploration   LINES/TUBES: 5/16 R [place] CVL >  5/16 ETT >  5/18 R femoral arterial line >   DISCUSSION: 67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 As of 5/22, fascia is partially open with plan to return to OR 5/22 for exploration / closure.  ASSESSMENT / PLAN:  PULMONARY A: Acute respiratory failure with hypoxemia Acute pulmonary edema P:   PRVC 8cc/kg, R 20 Wean PEEP / FiO2 for sats > 92% Assess ABG with rate/fiO2 adjustment as appropriate No weaning until abdomen closed  Repeat diuresis 5/22 with KCL   CARDIOVASCULAR A:  Shock - suspect sedation related  Tachycardia  P:  ICU monitoring  Wean levophed for MAP >65  RENAL A:   Hypokalemia AKI  Hypomagnesemia Hypophosphatemia  P:   KVO IVF  Lasix as above  Trend BMP / urinary output Replace electrolytes as indicated Avoid nephrotoxic agents as able, ensure adequate renal perfusion  GASTROINTESTINAL A:    Ischemic colon, s/p hemicolectomy and ileostomy Abdomen still open, partially closed on 5/20, for exploration / closure 5/22 P:   NPO for surgery 5/22 Resume trickle TF pm of 5/22 if ok with CCS Continue famotidine  Assess LFT's in am with scleral icterus  HEMATOLOGIC A:   Anemia - without bleeding P:  Trend CBC  INFECTIOUS A:   Ischemic bowel, s/p abdominal washout several times, appeared clear on 5/20 E.coli HCAP P:   Ceftriaxone as above, D8 abx total Follow fever curve / WBC trend Discontinue aline once surgery complete Consider PICC  line in next few days, central line on day 7  ENDOCRINE A:   Hyperglycemia P:   SSI, moderate scale   NEUROLOGIC A:   Sedation needs for vent synchrony P:   RASS Goal: -1 to -2, lighten once abd closed  Fentanyl gtt per PAD protocol  PRN versed for sedation    FAMILY  - Updates: Sister Bethena Roys) updated via phone 5/22.  She is his Media planner (not married, adult child who is special needs).     CC Time: 30 minutes   Noe Gens, NP-C [facility] Pgr: [phone] or if no answer [phone] 09/10/2017, 8:39 AM

## 2017-09-10 NOTE — Progress Notes (Signed)
[facility] Progress Note Patient Name: Luke Kaufman DOB: 13-Jan-1951 MRN: [id number]   Date of Service  09/10/2017  HPI/Events of Note  K+ = 3.3 and Creatinine = 1.44.  eICU Interventions  Will replace K+.     Intervention Category Major Interventions: Electrolyte abnormality - evaluation and management  Maye Parkinson Eugene 09/10/2017, 7:06 AM

## 2017-09-11 ENCOUNTER — Inpatient Hospital Stay (HOSPITAL_COMMUNITY): Payer: PPO

## 2017-09-11 ENCOUNTER — Encounter (HOSPITAL_COMMUNITY): Payer: Self-pay | Admitting: General Surgery

## 2017-09-11 DIAGNOSIS — R579 Shock, unspecified: Secondary | ICD-10-CM

## 2017-09-11 LAB — GLUCOSE, CAPILLARY
GLUCOSE-CAPILLARY: 116 mg/dL — AB (ref 65–99)
GLUCOSE-CAPILLARY: 131 mg/dL — AB (ref 65–99)
Glucose-Capillary: 67 mg/dL (ref 65–99)
Glucose-Capillary: 141 mg/dL — ABNORMAL HIGH (ref 65–99)
Glucose-Capillary: 121 mg/dL — ABNORMAL HIGH (ref 65–99)
Glucose-Capillary: 101 mg/dL — ABNORMAL HIGH (ref 65–99)
Glucose-Capillary: 110 mg/dL — ABNORMAL HIGH (ref 65–99)

## 2017-09-11 LAB — COMPREHENSIVE METABOLIC PANEL
ALT: 27 U/L (ref 17–63)
ANION GAP: 10 (ref 5–15)
AST: 30 U/L (ref 15–41)
Albumin: 1.7 g/dL — ABNORMAL LOW (ref 3.5–5.0)
Alkaline Phosphatase: 67 U/L (ref 38–126)
BUN: 15 mg/dL (ref 6–20)
CHLORIDE: 104 mmol/L (ref 101–111)
CO2: 28 mmol/L (ref 22–32)
Calcium: 7.4 mg/dL — ABNORMAL LOW (ref 8.9–10.3)
Creatinine, Ser: 1.65 mg/dL — ABNORMAL HIGH (ref 0.61–1.24)
GFR calc non Af Amer: 41 mL/min — ABNORMAL LOW (ref >60)
GFR, EST AFRICAN AMERICAN: 48 mL/min — AB (ref >60)
Glucose, Bld: 110 mg/dL — ABNORMAL HIGH (ref 65–99)
Potassium: 3.8 mmol/L (ref 3.5–5.1)
SODIUM: 142 mmol/L (ref 135–145)
Total Bilirubin: 0.8 mg/dL (ref 0.3–1.2)
Total Protein: 4.6 g/dL — ABNORMAL LOW (ref 6.5–8.1)

## 2017-09-11 MED ORDER — FUROSEMIDE 10 MG/ML IJ SOLN
40.0000 mg | Freq: Two times a day (BID) | INTRAMUSCULAR | Status: AC
  Administered 2017-09-11 (×2): 40 mg via INTRAVENOUS
  Filled 2017-09-11 (×2): qty 4

## 2017-09-11 MED ORDER — DEXTROSE 50 % IV SOLN
INTRAVENOUS | Status: AC
  Administered 2017-09-11: 50 mL
  Filled 2017-09-11: qty 50

## 2017-09-11 MED ORDER — DEXTROSE 50 % IV SOLN
25.0000 mL | Freq: Once | INTRAVENOUS | Status: AC
  Administered 2017-09-11: 25 mL via INTRAVENOUS

## 2017-09-11 MED ORDER — PRO-STAT SUGAR FREE PO LIQD
30.0000 mL | Freq: Two times a day (BID) | ORAL | Status: DC
  Administered 2017-09-11 (×2): 30 mL
  Filled 2017-09-11 (×2): qty 30

## 2017-09-11 MED ORDER — VITAL HIGH PROTEIN PO LIQD
1000.0000 mL | ORAL | Status: AC
  Administered 2017-09-11: 1000 mL

## 2017-09-11 MED ORDER — DOCUSATE SODIUM 50 MG/5ML PO LIQD
100.0000 mg | Freq: Every day | ORAL | Status: DC
  Administered 2017-09-11 – 2017-09-26 (×10): 100 mg
  Filled 2017-09-11 (×10): qty 10

## 2017-09-11 NOTE — Progress Notes (Signed)
[facility] Progress Note 1 Day Post-Op  Subjective: Could not get the patient's abdomen closed yesterday.  Ileostomy dusky but viable.    Objective: Vital signs in last 24 hours: Temp:  [98.4 F (36.9 C)-100 F (37.8 C)] 99 F (37.2 C) (05/23 0000) Pulse Rate:  [63-109] 102 (05/23 0801) Resp:  [0-29] 20 (05/23 0801) BP: (99-172)/(41-101) 110/58 (05/23 0801) SpO2:  [91 %-100 %] 91 % (05/23 0801) Arterial Line BP: (71-213)/(44-210) 107/54 (05/23 0800) FiO2 (%):  [80 %-100 %] 80 % (05/23 0801) Weight:  [130 kg (286 lb 9.6 oz)] 130 kg (286 lb 9.6 oz) (05/23 0500) Last BM Date: 09/10/17  Intake/Output from previous day: 05/22 0701 - 05/23 0700 In: 1705.3 [I.V.:1375.3; NG/GT:30; IV Piggyback:300] Out: 4525 [Urine:2950; Emesis/NG output:550; Drains:950; Stool:50; Blood:25] Intake/Output this shift: No intake/output data recorded.  General: Gets generally very agitated with sedation being weaned and starts to strain with abdominal breathing.  So far this has not had a negative affect on his NPWD, but it could cause the device to fail  Lungs: Coarse breath sounds on the left.  Clear on the right.  Abd: No bowel sounds. But ileostomy is putting out bilious fluid.  Extremities: No changes.  Edematous  Neuro: Sedated  Lab Results:  @LABLAST2 (wbc:2,hgb:2,hct:2,plt:2) BMET ) Recent Labs    09/10/17 0500 09/11/17 0320  NA 140 142  K 3.3* 3.8  CL 105 104  CO2 26 28  GLUCOSE 100* 110*  BUN 14 15  CREATININE 1.44* 1.65*  CALCIUM 7.0* 7.4*   PT/INR No results for input(s): LABPROT, INR in the last 72 hours. ABG Recent Labs    09/08/17 0842 09/10/17 1014  PHART 7.423 7.412  HCO3 25.8 25.5    Studies/Results: Dg Chest Port 1 View  Result Date: 09/10/2017 CLINICAL DATA:  Acute respiratory failure EXAM: PORTABLE CHEST 1 VIEW COMPARISON:  09/09/2017 FINDINGS: Endotracheal tube nasogastric catheter and right-sided subclavian central line are again seen and stable. Cardiac  shadow is stable. Bilateral pleural effusions right greater than left are seen stable in appearance from the prior exam. Bibasilar changes are again noted and stable. No bony abnormality is noted. IMPRESSION: Bibasilar changes with associated effusions right greater than left. Electronically Signed   By: Inez Catalina M.D.   On: 09/10/2017 08:01    Anti-infectives: Anti-infectives (From admission, onward)   Start     Dose/Rate Route Frequency Ordered Stop   09/07/17 1545  cefTRIAXone (ROCEPHIN) 2 g in sodium chloride 0.9 % 100 mL IVPB     2 g 200 mL/hr over 30 Minutes Intravenous Every 24 hours 09/07/17 1543 09/12/17 1544   09/04/17 1400  piperacillin-tazobactam (ZOSYN) IVPB 3.375 g  Status:  Discontinued     3.375 g 12.5 mL/hr over 240 Minutes Intravenous Every 8 hours 09/04/17 1157 09/07/17 1541   09/04/17 1100  ceFAZolin (ANCEF) IVPB 2g/100 mL premix  Status:  Discontinued     2 g 200 mL/hr over 30 Minutes Intravenous Every 8 hours 09/04/17 1035 09/04/17 1157   09/04/17 0630  piperacillin-tazobactam (ZOSYN) IVPB 3.375 g  Status:  Discontinued     3.375 g 100 mL/hr over 30 Minutes Intravenous  Once 09/04/17 0617 09/04/17 0626   09/04/17 0619  piperacillin-tazobactam (ZOSYN) IVPB 3.375 g     3.375 g 100 mL/hr over 30 Minutes Intravenous 60 min pre-op 09/04/17 0620 09/04/17 0717   09/04/17 0030  piperacillin-tazobactam (ZOSYN) IVPB 3.375 g     3.375 g 100 mL/hr over 30 Minutes Intravenous  Once  09/04/17 0024 09/04/17 0144      Assessment/Plan: s/p Procedure(s): EXPLORATORY LAPAROTOMY WITH POSSIBLE WOUND CLOSURE ABDOMEN Will need to go back to the OR tomorrow for possible Elastic/tension closure.  LOS: 7 days   Kathryne Eriksson. Dahlia Bailiff, MD, FACS [phone] [facility] 09/11/2017

## 2017-09-11 NOTE — Progress Notes (Signed)
PULMONARY / CRITICAL CARE MEDICINE   Name: Luke Kaufman MRN: [id number] DOB: 16-Oct-1950    ADMISSION DATE:  09/03/2017 CONSULTATION DATE:  5/16  REFERRING MD:  Oneida Alar  CHIEF COMPLAINT:  Post op Vent management  HISTORY OF PRESENT ILLNESS:   67 y/o male presented on 5/15 with abdominal pain, had an ischemic right colon.  Had a R hemicolectomy and eventually an ileostomy.    SUBJECTIVE:   Went back for attempted closure 5/22, but unable due to current anatomy Reviewed case w dr Hulen Skains - some concern that his movement, agitation may contribute to difficulty closing  Negative I/O last 48h Norepi at 16, fentanyl 400, Versed given once overnight  VITAL SIGNS: BP (!) 110/58   Pulse (!) 102   Temp 99.8 F (37.7 C) (Oral)   Resp 20   Ht 5\' 7"  (1.702 m)   Wt 130 kg (286 lb 9.6 oz)   SpO2 91%   BMI 44.89 kg/m   HEMODYNAMICS: CVP:  [5 mmHg-17 mmHg] 10 mmHg  VENTILATOR SETTINGS: Vent Mode: PRVC FiO2 (%):  [80 %-100 %] 80 % Set Rate:  [20 bmp] 20 bmp Vt Set:  [540 mL] 540 mL PEEP:  [8 cmH20] 8 cmH20 Plateau Pressure:  [12 cmH20-22 cmH20] 22 cmH20  INTAKE / OUTPUT: I/O last 3 completed shifts: In: 2417.6 [I.V.:1797.6; NG/GT:270; IV Piggyback:350] Out: 1245 [Urine:5750; Emesis/NG output:550; Drains:1600; Stool:50; Blood:25]  PHYSICAL EXAMINATION: General: Obese ill-appearing man, ventilated, sedated HEENT: Endotracheal tube in place, pupils equal, no apparent oral lesions Neuro: Completely sedated, unresponsive to voice, stimulation CV: Tachycardic, regular, no murmur heard PULM: Clear bilaterally, decreased at both bases, some double breaths on mechanical ventilation GI: Soft, large midline incision wound with a wound VAC in place, ostomy well-appearing, positive bowel sounds Extremities: 1+ pretibial pitting edema Skin: No rash Lines: Not assessed 5/23  LABS:  BMET Recent Labs  Lab 09/09/17 1320 09/10/17 0500 09/11/17 0320  NA 138 140 142  K 3.3* 3.3* 3.8  CL  102 105 104  CO2 26 26 28   BUN 14 14 15   CREATININE 1.30* 1.44* 1.65*  GLUCOSE 104* 100* 110*    Electrolytes Recent Labs  Lab 09/09/17 0355 09/09/17 1320 09/10/17 0500 09/11/17 0320  CALCIUM 6.3* 7.0* 7.0* 7.4*  MG 1.8 2.4 2.1  --   PHOS 1.6* 2.4* 2.7  --     CBC Recent Labs  Lab 09/08/17 0423 09/08/17 0842 09/09/17 0355 09/10/17 0500  WBC 11.3*  --  11.2* 10.3  HGB 10.2* 8.2* 9.2* 9.2*  HCT 29.9* 24.0* 27.8* 28.3*  PLT 163  --  154 175    Coag's Recent Labs  Lab 09/04/17 1036  APTT 56*  INR 1.60    Sepsis Markers Recent Labs  Lab 09/04/17 1624 09/05/17 0750 09/05/17 1021 09/06/17 0630 09/10/17 1007  LATICACIDVEN 1.7  --  1.4  --  2.11*  PROCALCITON  --  3.22  --  2.10  --     ABG Recent Labs  Lab 09/08/17 0210 09/08/17 0842 09/10/17 1014  PHART 7.506* 7.423 7.412  PCO2ART 40.2 39.6 40.3  PO2ART 79.0* 89.0 119.0*    Liver Enzymes Recent Labs  Lab 09/05/17 0750 09/11/17 0320  AST 376* 30  ALT 313* 27  ALKPHOS 47 67  BILITOT 0.8 0.8  ALBUMIN 2.4* 1.7*    Cardiac Enzymes Recent Labs  Lab 09/05/17 1352 09/05/17 2035 09/06/17 0148  TROPONINI 0.36* 0.27* 0.29*    Glucose Recent Labs  Lab 09/10/17 1134 09/10/17 1610  09/10/17 1932 09/10/17 2325 09/11/17 0321 09/11/17 0349  GLUCAP 95 93 105* 108* 67 141*    Imaging No results found.   STUDIES:  5/15 CT ab/pelvis > portal venous gas in liver, sigmoid colon with diverticular disease, atrophic left kidney  CULTURES: 5/17 resp > e coli resistant to multiple antibiotics 5/17 blood >  negative  ANTIBIOTICS: 5/15 zosyn > 5/19  5/19 ceftriaxone >   SIGNIFICANT EVENTS: 5/16 mesenteric angiogram and aorto to SMA bypass 5/16 R hemicolectomy 5/18 ileostomy 5/19 abdominal washout and partial fascial closure 5/21 TF started, hypoxia with turning  5/22 Return to OR for exploration, attempted closure > unable  LINES/TUBES: 5/16 R [place] CVL >  5/16 ETT >  5/18 R femoral  arterial line >   DISCUSSION: 67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 Fascia remains open  ASSESSMENT / PLAN:  PULMONARY A: Acute respiratory failure with hypoxemia Acute pulmonary edema P:   Continue PRVC 8 cc/kg Wean FiO2, currently 80%.  Continue PEEP of 8 Agree that volume overload is a contributor here, diuresis as he can tolerate His abdomen is tenuous and any movement, abdominal strain puts his closure at risk; suspect that based on this and his current vent needs he will require tracheostomy to get through this.  CARDIOVASCULAR A:  Shock - suspect sedation related +/-sepsis Tachycardia  P:  Continue to wean norepinephrine as able.  Suspect that diuresis, sedation are playing a role here. Would like to remove arterial catheter if we can achieve adequate cuff pressures 5/23  RENAL A:   Hypokalemia, resolved AKI  Hypomagnesemia Hypophosphatemia  P:   Minimize fluids Repeat Lasix 40 mg twice daily 5/23 Follow BMP, urine output, may need to scale back Lasix if serum creatinine continues to rise on 5/24 Replace electrolytes as indicated  GASTROINTESTINAL A:    Ischemic colon, s/p hemicolectomy and ileostomy Abdomen remains open, wound VAC in place. P:   Discussed with Dr. Hulen Skains 5/23, unable to close in the OR.  Likely will need a tension device placed, 5/24 Trickle tube feeding, plan for stop at midnight for trip back to the OR 5/24 Famotidine as ordered LFTs normalized  HEMATOLOGIC A:   Anemia - without bleeding P:  Follow CBC  INFECTIOUS A:   Ischemic bowel, s/p abdominal washout several times, appeared clear on 5/20 E.coli HCAP P:   Currently on ceftriaxone, has completed 8 days.  Duration of treatment unclear given continued open abdomen. D9 abx.  Consider discontinue on 5/24 and follow clinically. Goal discontinue arterial catheter, central venous catheter soon.  Will likely need PICC  line placement.  ENDOCRINE A:   Hyperglycemia P:   Continue sliding scale insulin, moderate scale  NEUROLOGIC A:   Sedation needs for vent synchrony P:   RASS Goal: -2 given risk for perturbing abdomen Continue phenyl drip as ordered, consider decreasing ceiling if we can add other sedation Consider addition of Precedex to facilitate a better wake up, possibly ventilator weaning PRN Versed available   FAMILY  - Updates: Sister Bethena Roys) updated via phone 5/22.  She is his Media planner (not married, adult child who is special needs).     Independent critical care time 34 minutes  Baltazar Apo, MD, PhD 09/11/2017, 8:57 AM [facility] [phone] or if no answer [phone]

## 2017-09-11 NOTE — Care Management Note (Signed)
Case Management Note Marvetta Gibbons RN,BSN [facility]22 Case Manager  [phone]  Patient Details  Name: Luke Kaufman MRN: [id number] Date of Birth: [birth year]  Subjective/Objective:  Pt admitted 5/15 with abdominal pain, had an ischemic right colon.  Had a R hemicolectomy and eventually an ileostomy.   5/22 returned to OR for attempted closure however unable doe to current anatomy. Plans for trip back to OR on 5/24- remains on Vent currently                  Action/Plan: PTA pt lived at home, will most likely need PT/OT evals when able to participate- CM to follow for transition of care needs  Expected Discharge Date:                  Expected Discharge Plan:     In-House Referral:     Discharge planning Services  CM Consult  Post Acute Care Choice:    Choice offered to:     DME Arranged:    DME Agency:     HH Arranged:    [place] Agency:     Status of Service:  In process, will continue to follow  If discussed at Long Length of Stay Meetings, dates discussed:    Discharge Disposition:   Additional Comments:  Dawayne Patricia, RN 09/11/2017, 10:12 AM

## 2017-09-11 NOTE — Progress Notes (Signed)
Two RT's attempted to place R radial aline with no success. Unable to try to the left side as it is restricted. RN notified.

## 2017-09-11 NOTE — Progress Notes (Signed)
Pt starting to wake up, belly breathing and double stacking breaths on vent, briefly desat to 86%.  Gave 100% fio2 breaths x 2 minutes and RN increased sedation.  VSS currently, pt still double stacking breaths on vent w/ increased sedation.  RN aware.

## 2017-09-12 ENCOUNTER — Inpatient Hospital Stay: Payer: Self-pay

## 2017-09-12 ENCOUNTER — Inpatient Hospital Stay (HOSPITAL_COMMUNITY): Payer: PPO | Admitting: Anesthesiology

## 2017-09-12 ENCOUNTER — Encounter (HOSPITAL_COMMUNITY)
Admission: EM | Disposition: A | Discharge status: Nursing Home (Includes ICF) | Payer: Self-pay | Source: Home / Self Care | Attending: Pulmonary Disease

## 2017-09-12 HISTORY — PX: TRACHEOSTOMY TUBE PLACEMENT: SHX814

## 2017-09-12 HISTORY — PX: APPLICATION OF WOUND VAC: SHX5189

## 2017-09-12 HISTORY — PX: LAPAROTOMY: SHX154

## 2017-09-12 LAB — GLUCOSE, CAPILLARY
GLUCOSE-CAPILLARY: 104 mg/dL — AB (ref 65–99)
GLUCOSE-CAPILLARY: 118 mg/dL — AB (ref 65–99)
GLUCOSE-CAPILLARY: 139 mg/dL — AB (ref 65–99)
Glucose-Capillary: 103 mg/dL — ABNORMAL HIGH (ref 65–99)
Glucose-Capillary: 100 mg/dL — ABNORMAL HIGH (ref 65–99)
Glucose-Capillary: 147 mg/dL — ABNORMAL HIGH (ref 65–99)

## 2017-09-12 LAB — BASIC METABOLIC PANEL
Anion gap: 10 (ref 5–15)
BUN: 17 mg/dL (ref 6–20)
CO2: 29 mmol/L (ref 22–32)
CREATININE: 1.69 mg/dL — AB (ref 0.61–1.24)
Calcium: 7.4 mg/dL — ABNORMAL LOW (ref 8.9–10.3)
Chloride: 104 mmol/L (ref 101–111)
GFR calc non Af Amer: 40 mL/min — ABNORMAL LOW (ref >60)
GFR, EST AFRICAN AMERICAN: 47 mL/min — AB (ref >60)
Glucose, Bld: 117 mg/dL — ABNORMAL HIGH (ref 65–99)
Potassium: 3.6 mmol/L (ref 3.5–5.1)
Sodium: 143 mmol/L (ref 135–145)

## 2017-09-12 LAB — CBC
HCT: 29.1 % — ABNORMAL LOW (ref 39.0–52.0)
Hemoglobin: 9.2 g/dL — ABNORMAL LOW (ref 13.0–17.0)
MCH: 30 pg (ref 26.0–34.0)
MCHC: 31.6 g/dL (ref 30.0–36.0)
MCV: 94.8 fL (ref 78.0–100.0)
Platelets: 146 10*3/uL — ABNORMAL LOW (ref 150–400)
RBC: 3.07 MIL/uL — AB (ref 4.22–5.81)
RDW: 14.6 % (ref 11.5–15.5)
WBC: 12.9 10*3/uL — AB (ref 4.0–10.5)

## 2017-09-12 LAB — MAGNESIUM: Magnesium: 2 mg/dL (ref 1.7–2.4)

## 2017-09-12 SURGERY — LAPAROTOMY, EXPLORATORY
Anesthesia: General | Site: Neck

## 2017-09-12 SURGERY — CREATION, TRACHEOSTOMY
Anesthesia: General

## 2017-09-12 MED ORDER — DEXAMETHASONE SODIUM PHOSPHATE 10 MG/ML IJ SOLN
INTRAMUSCULAR | Status: DC | PRN
  Administered 2017-09-12: 10 mg via INTRAVENOUS

## 2017-09-12 MED ORDER — MIDAZOLAM HCL 2 MG/2ML IJ SOLN
INTRAMUSCULAR | Status: AC
  Filled 2017-09-12: qty 2

## 2017-09-12 MED ORDER — PRO-STAT SUGAR FREE PO LIQD
30.0000 mL | Freq: Three times a day (TID) | ORAL | Status: DC
  Administered 2017-09-12 – 2017-10-02 (×53): 30 mL
  Filled 2017-09-12 (×54): qty 30

## 2017-09-12 MED ORDER — LACTATED RINGERS IV SOLN
INTRAVENOUS | Status: DC | PRN
  Administered 2017-09-12: 13:00:00 via INTRAVENOUS

## 2017-09-12 MED ORDER — PROPOFOL 10 MG/ML IV BOLUS
INTRAVENOUS | Status: AC
  Filled 2017-09-12: qty 20

## 2017-09-12 MED ORDER — VITAL HIGH PROTEIN PO LIQD
1000.0000 mL | ORAL | Status: AC
  Administered 2017-09-12: 1000 mL

## 2017-09-12 MED ORDER — FUROSEMIDE 10 MG/ML IJ SOLN
40.0000 mg | Freq: Two times a day (BID) | INTRAMUSCULAR | Status: AC
  Administered 2017-09-12 (×2): 40 mg via INTRAVENOUS
  Filled 2017-09-12 (×2): qty 4

## 2017-09-12 MED ORDER — SUGAMMADEX SODIUM 200 MG/2ML IV SOLN
INTRAVENOUS | Status: AC
  Filled 2017-09-12: qty 2

## 2017-09-12 MED ORDER — FENTANYL CITRATE (PF) 250 MCG/5ML IJ SOLN
INTRAMUSCULAR | Status: AC
  Filled 2017-09-12: qty 5

## 2017-09-12 MED ORDER — MIDAZOLAM HCL 5 MG/5ML IJ SOLN
INTRAMUSCULAR | Status: DC | PRN
  Administered 2017-09-12: 2 mg via INTRAVENOUS

## 2017-09-12 MED ORDER — ONDANSETRON HCL 4 MG/2ML IJ SOLN
INTRAMUSCULAR | Status: AC
  Filled 2017-09-12: qty 2

## 2017-09-12 MED ORDER — FENTANYL CITRATE (PF) 250 MCG/5ML IJ SOLN
INTRAMUSCULAR | Status: DC | PRN
  Administered 2017-09-12 (×2): 50 ug via INTRAVENOUS

## 2017-09-12 MED ORDER — EPHEDRINE SULFATE 50 MG/ML IJ SOLN
INTRAMUSCULAR | Status: AC
  Filled 2017-09-12: qty 1

## 2017-09-12 MED ORDER — PROPOFOL 10 MG/ML IV BOLUS
INTRAVENOUS | Status: DC | PRN
  Administered 2017-09-12: 30 mg via INTRAVENOUS

## 2017-09-12 MED ORDER — 0.9 % SODIUM CHLORIDE (POUR BTL) OPTIME
TOPICAL | Status: DC | PRN
  Administered 2017-09-12: 1000 mL

## 2017-09-12 MED ORDER — PHENYLEPHRINE 40 MCG/ML (10ML) SYRINGE FOR IV PUSH (FOR BLOOD PRESSURE SUPPORT)
PREFILLED_SYRINGE | INTRAVENOUS | Status: AC
  Filled 2017-09-12: qty 10

## 2017-09-12 MED ORDER — ROCURONIUM BROMIDE 10 MG/ML (PF) SYRINGE
PREFILLED_SYRINGE | INTRAVENOUS | Status: DC | PRN
Start: 2017-09-12 — End: 2017-09-12
  Administered 2017-09-12 (×2): 30 mg via INTRAVENOUS
  Administered 2017-09-12: 20 mg via INTRAVENOUS

## 2017-09-12 MED ORDER — ONDANSETRON HCL 4 MG/2ML IJ SOLN
INTRAMUSCULAR | Status: DC | PRN
  Administered 2017-09-12: 4 mg via INTRAVENOUS

## 2017-09-12 MED ORDER — SODIUM CHLORIDE 0.9 % IJ SOLN
INTRAMUSCULAR | Status: AC
  Filled 2017-09-12: qty 10

## 2017-09-12 MED ORDER — PHENYLEPHRINE 40 MCG/ML (10ML) SYRINGE FOR IV PUSH (FOR BLOOD PRESSURE SUPPORT)
PREFILLED_SYRINGE | INTRAVENOUS | Status: DC | PRN
  Administered 2017-09-12: 200 ug via INTRAVENOUS
  Administered 2017-09-12: 120 ug via INTRAVENOUS

## 2017-09-12 SURGICAL SUPPLY — 71 items
ATTRACTOMAT 16X20 MAGNETIC DRP (DRAPES) IMPLANT
BENZOIN TINCTURE PRP APPL 2/3 (GAUZE/BANDAGES/DRESSINGS) ×4 IMPLANT
BLADE CLIPPER SURG (BLADE) IMPLANT
BLADE SURG 11 STRL SS (BLADE) ×4 IMPLANT
CANISTER SUCT 3000ML PPV (MISCELLANEOUS) ×4 IMPLANT
CANISTER WOUND CARE 500ML ATS (WOUND CARE) ×4 IMPLANT
CHLORAPREP W/TINT 26ML (MISCELLANEOUS) ×4 IMPLANT
CLEANER TIP ELECTROSURG 2X2 (MISCELLANEOUS) IMPLANT
COVER MAYO STAND STRL (DRAPES) ×4 IMPLANT
COVER SURGICAL LIGHT HANDLE (MISCELLANEOUS) ×4 IMPLANT
DRAPE LAPAROSCOPIC ABDOMINAL (DRAPES) ×4 IMPLANT
DRAPE ORTHO SPLIT 77X108 STRL (DRAPES) ×2
DRAPE SURG ORHT 6 SPLT 77X108 (DRAPES) ×2 IMPLANT
DRAPE UTILITY XL STRL (DRAPES) ×4 IMPLANT
DRAPE WARM FLUID 44X44 (DRAPE) ×4 IMPLANT
DRSG OPSITE POSTOP 4X10 (GAUZE/BANDAGES/DRESSINGS) IMPLANT
DRSG OPSITE POSTOP 4X8 (GAUZE/BANDAGES/DRESSINGS) IMPLANT
ELECT BLADE 6.5 EXT (BLADE) IMPLANT
ELECT CAUTERY BLADE 6.4 (BLADE) ×4 IMPLANT
ELECT REM PT RETURN 9FT ADLT (ELECTROSURGICAL) ×4
ELECTRODE REM PT RTRN 9FT ADLT (ELECTROSURGICAL) ×2 IMPLANT
GAUZE SPONGE 4X4 16PLY XRAY LF (GAUZE/BANDAGES/DRESSINGS) ×4 IMPLANT
GAUZE XEROFORM 5X9 LF (GAUZE/BANDAGES/DRESSINGS) ×4 IMPLANT
GEL ULTRASOUND 20GR AQUASONIC (MISCELLANEOUS) ×4 IMPLANT
GLOVE BIO SURGEON STRL SZ7 (GLOVE) ×4 IMPLANT
GLOVE BIOGEL PI IND STRL 8 (GLOVE) ×6 IMPLANT
GLOVE BIOGEL PI INDICATOR 8 (GLOVE) ×6
GLOVE ECLIPSE 7.5 STRL STRAW (GLOVE) ×8 IMPLANT
GOWN STRL REUS W/ TWL LRG LVL3 (GOWN DISPOSABLE) ×10 IMPLANT
GOWN STRL REUS W/TWL LRG LVL3 (GOWN DISPOSABLE) ×10
HOLDER TRACH TUBE VELCRO 19.5 (MISCELLANEOUS) ×4 IMPLANT
KIT BASIN OR (CUSTOM PROCEDURE TRAY) ×4 IMPLANT
KIT OSTOMY DRAINABLE 2.75 STR (WOUND CARE) ×4 IMPLANT
KIT TURNOVER KIT B (KITS) ×4 IMPLANT
LIGASURE IMPACT 36 18CM CVD LR (INSTRUMENTS) IMPLANT
MARKER SKIN DUAL TIP RULER LAB (MISCELLANEOUS) ×4 IMPLANT
NS IRRIG 1000ML POUR BTL (IV SOLUTION) ×8 IMPLANT
PACK EENT II TURBAN DRAPE (CUSTOM PROCEDURE TRAY) IMPLANT
PACK GENERAL/GYN (CUSTOM PROCEDURE TRAY) ×4 IMPLANT
PAD ABD 8X10 STRL (GAUZE/BANDAGES/DRESSINGS) ×4 IMPLANT
PAD ARMBOARD 7.5X6 YLW CONV (MISCELLANEOUS) ×8 IMPLANT
PENCIL BUTTON HOLSTER BLD 10FT (ELECTRODE) ×4 IMPLANT
SEPRAFILM PROCEDURAL PACK 3X5 (MISCELLANEOUS) IMPLANT
SOLUTION BETADINE 4OZ (MISCELLANEOUS) ×4 IMPLANT
SPECIMEN JAR LARGE (MISCELLANEOUS) IMPLANT
SPONGE ABD ABTHERA ADVANCE (MISCELLANEOUS) ×4 IMPLANT
SPONGE DRAIN TRACH 4X4 STRL 2S (GAUZE/BANDAGES/DRESSINGS) ×4 IMPLANT
SPONGE INTESTINAL PEANUT (DISPOSABLE) ×4 IMPLANT
SPONGE LAP 18X18 X RAY DECT (DISPOSABLE) IMPLANT
SPRAY BETADINE AEROSOL XXX (MISCELLANEOUS) ×4 IMPLANT
STAPLER VISISTAT 35W (STAPLE) IMPLANT
SUCTION POOLE TIP (SUCTIONS) ×4 IMPLANT
SUT ETHILON 3 0 FSL (SUTURE) ×4 IMPLANT
SUT NOVA 1 T20/GS 25DT (SUTURE) IMPLANT
SUT PDS AB 1 TP1 96 (SUTURE) IMPLANT
SUT SILK 2 0 SH (SUTURE) ×4 IMPLANT
SUT SILK 2 0 SH CR/8 (SUTURE) IMPLANT
SUT SILK 2 0 TIES 10X30 (SUTURE) IMPLANT
SUT SILK 3 0 SH CR/8 (SUTURE) IMPLANT
SUT SILK 3 0 TIES 10X30 (SUTURE) IMPLANT
SUT VICRYL AB 3 0 TIES (SUTURE) ×4 IMPLANT
SYR 20CC LL (SYRINGE) ×4 IMPLANT
TOWEL OR 17X24 6PK STRL BLUE (TOWEL DISPOSABLE) ×4 IMPLANT
TOWEL OR 17X26 10 PK STRL BLUE (TOWEL DISPOSABLE) ×4 IMPLANT
TRAY FOLEY MTR SLVR 16FR STAT (SET/KITS/TRAYS/PACK) IMPLANT
TUBE CONNECTING 12'X1/4 (SUCTIONS) ×1
TUBE CONNECTING 12X1/4 (SUCTIONS) ×3 IMPLANT
TUBE TRACH SHILEY  6 DIST  CUF (TUBING) IMPLANT
TUBE TRACH SHILEY 10 DIST CUFF (TUBING) IMPLANT
TUBE TRACH SHILEY 8 DIST CUF (TUBING) ×4 IMPLANT
YANKAUER SUCT BULB TIP NO VENT (SUCTIONS) ×4 IMPLANT

## 2017-09-12 NOTE — Progress Notes (Signed)
Peripherally Inserted Central Catheter/Midline Placement  The IV Nurse has discussed with the patient and/or persons authorized to consent for the patient, the purpose of this procedure and the potential benefits and risks involved with this procedure.  The benefits include less needle sticks, lab draws from the catheter, and the patient may be discharged home with the catheter. Risks include, but not limited to, infection, bleeding, blood clot (thrombus formation), and puncture of an artery; nerve damage and irregular heartbeat and possibility to perform a PICC exchange if needed/ordered by physician.  Alternatives to this procedure were also discussed.  Bard Power PICC patient education guide, fact sheet on infection prevention and patient information card has been provided to patient /or left at bedside.    PICC/Midline Placement Documentation  PICC Double Lumen 11/21/21 PICC Left Cephalic 47 cm 0 cm (Active)  Indication for Insertion or Continuance of Line Poor Vasculature-patient has had multiple peripheral attempts or PIVs lasting less than 24 hours 09/12/2017  8:17 PM  Exposed Catheter (cm) 0 cm 09/12/2017  8:17 PM  Site Assessment Clean;Dry;Intact 09/12/2017  8:17 PM  Lumen #1 Status Blood return noted;Flushed;Saline locked 09/12/2017  8:17 PM  Lumen #2 Status Blood return noted;Flushed;Saline locked 09/12/2017  8:17 PM  Dressing Type Transparent;Occlusive 09/12/2017  8:17 PM  Dressing Status Clean;Dry;Intact;Antimicrobial disc in place 09/12/2017  8:17 PM  Dressing Intervention New dressing 09/12/2017  8:17 PM  Dressing Change Due 09/19/17 09/12/2017  8:17 PM       Aldona Lento L 09/12/2017, 8:30 PM

## 2017-09-12 NOTE — Consult Note (Signed)
[place] Nurse ostomy follow up Stoma type/location: RUQ created 09/06/17.  IN surgery yesterday and pouch was changed.  WIll not change today. Stoma is dusky but viable per MD note.  Output liquid brown effluent Ostomy pouching: 1pc.flat pouch.  Applied yesterday in OR Education provided: [employer] Supp[employer] at bedside.  POuches at bedside.  WIll change again 09/15/17 [place] team will follow.  Domenic Moras RN BSN [place] Pager [phone]

## 2017-09-12 NOTE — Anesthesia Postprocedure Evaluation (Signed)
Anesthesia Post Note  Patient: Luke Kaufman  Procedure(s) Performed: EXPLORATORY LAPAROTOMY (N/A Abdomen) TRACHEOSTOMY (N/A Neck) APPLICATION OF WOUND VAC  (CHANGE) (N/A Abdomen)     Patient location during evaluation: SICU Anesthesia Type: General Level of consciousness: sedated Pain management: pain level controlled Vital Signs Assessment: post-procedure vital signs reviewed and stable Respiratory status: patient remains intubated per anesthesia plan Cardiovascular status: stable Postop Assessment: no apparent nausea or vomiting Anesthetic complications: no    Last Vitals:  Vitals:   09/12/17 1419 09/12/17 1448  BP: (!) 102/54 (!) 128/55  Pulse:  64  Resp: (!) 22 20  Temp: 36.8 C   SpO2: 96% 98%    Last Pain:  Vitals:   09/12/17 1419  TempSrc: Oral  PainSc:                  Koryn Charlot,W. EDMOND

## 2017-09-12 NOTE — Op Note (Signed)
OPERATIVE REPORT  DATE OF OPERATION:  09/12/2017  PATIENT:  Luke Kaufman  67 y.o. male  PRE-OPERATIVE DIAGNOSIS:  Open abdomen, respiratory failure  POST-OPERATIVE DIAGNOSIS:  Same  INDICATION(S) FOR OPERATION:  Patient h as an open abdomen from a bowel resection associated with mesenteric ischemia, postoperative respiratory failure  FINDINGS:  Normal anatomy with shortened neck, No abscess cavity or bowel ischemia noted  PROCEDURE:  Procedure(s): EXPLORATORY LAPAROTOMY TRACHEOSTOMY APPLICATION OF WOUND VAC  (CHANGE)  SURGEON:  Surgeon(s): Judeth Horn, MD  ASSISTANT: [place], PA-C  ANESTHESIA:   general  COMPLICATIONS:  None  EBL: <20 ml  BLOOD ADMINISTERED: none  DRAINS: Nasogastric Tube, Urinary Catheter (Foley) and Negative pressure wound dressing.   SPECIMEN:  No Specimen  COUNTS CORRECT:  YES  PROCEDURE DETAILS: The patient was taken to the operating room and placed on table in supine position.  He was brought directly from the intensive care unit intubated and sedated with an open abdomen dressing in place.  The patient was in respiratory failure and required tracheostomy.  We started with the procedure of placing a roll underneath his shoulders and extending his neck.  We subsequently prepped and draped his neck in usual sterile manner.  A proper timeout was performed identifying the patient and procedure to be performed.  We palpated the thyroid notch and the trachea just above the sternal notch made a transverse incision approximately 4 cm long.  We dissected down to subcutaneous tissue using electrocautery also blunt dissection with a hemostat clamp down to the tracheal rings.  Were able to easily identify the first and second tracheal rings with retractors in place.  Stay sutures of 2-0 silk were placed on the lateral aspect of the second tracheal ring.  We subsequently made an inferior tracheotomy using a #11 blade however the second cartilage was calcified  we had to use Metzenbaum scissors to cut it.  We subsequently enlarged the tracheotomy and watch the endotracheal tube as it was pulled back.  We then passed a #8 Shiley tracheostomy tube into the tracheotomy.  The inner cannula was placed, the balloon was inflated, we connected to the ventilator circuit which demonstrated excellent carbon dioxide gas return in good oxygenation.  After he was in place a drain sponge was placed underneath the flanges of the tracheostomy tube.  We secured the tracheostomy with 3-0 nylon sutures and then a tracheal tie.  All counts were correct including needles sponges and instruments.  We subsequently went onto the second portion of procedure which was to change his negative pressure wound dressing to a special closure device called the ABRA device.  We open the abdomen by removing the previous negative pressure wound dressing, irrigated all 4 quadrants with saline solution.  We explored it briefly for any localized abscesses and none were found.  We then were to place the special device however the components of the device were expired by dates.  We therefore replaced the negative pressure wound dressing and took the patient from the operating room.  All needle counts, sponge counts, and instrument counts were correct.  PATIENT DISPOSITION:  ventilated through the new tracheostomy tube and in critical condition.     Judeth Horn 5/24/20192:04 PM

## 2017-09-12 NOTE — OR Nursing (Signed)
Obturator taped to head of bed prior to leaving OR.

## 2017-09-12 NOTE — Significant Event (Signed)
Patient has a Cortrak feeding tube now. Able to use per RD Barnetta Chapel who placed it. Spoke with Dr. Lamonte Sakai who gave RN verbal order that RN can start tube feeds at 10cc/hour at this time.    Luke Kaufman

## 2017-09-12 NOTE — Progress Notes (Signed)
Nutrition Follow-up  DOCUMENTATION CODES:   Obesity unspecified  INTERVENTION:   As able recommend Vital High Protein goal rate of 55 ml/hr   30 ml Prostat TID ordered  At goal will provide: 1620 kcal, 160 grams protein, and 1103 ml free water  NUTRITION DIAGNOSIS:   Increased nutrient needs related to wound healing as evidenced by estimated needs. Ongoing.   GOAL:   Provide needs based on ASPEN/SCCM guidelines Progressing.   MONITOR:   TF tolerance, Skin  REASON FOR ASSESSMENT:   Consult Enteral/tube feeding initiation and management  ASSESSMENT:   Pt with PMH of HTN, active smoker (2 PPD), CAD, MI, stroke, PAD who was admitted 5/15 with severe epigastric and periumbilical pain found to have ischemic R colon. Pt s/p ex lap with R hemicolectomy 5/16.   5/16 mesenteric angiogram and aorto to SMA bypass 5/16 R hemicolectomy 5/18 ileostomy 5/20 abdominal washout and partial fascial closure 5/20 trickle TF started, Vital High Protein @ 10 ml/hr with 30 ml Prostat BID (provides: 440 kcal and 51 grams protein)  5/24 OR for trach and wound VAC change, Cortrak placed, tip post pyloric    Medications reviewed and include: colace, lasix, SSI, 2 g mag sulfate daily Levophed @ 10 mcg/min Labs reviewed UOP: 2130 ml VAC: 2175 ml 14 L positive, 3+ edema   Diet Order:   Diet Order           Diet NPO time specified  Diet effective midnight          EDUCATION NEEDS:   No education needs have been identified at this time  Skin:  Skin Assessment: Skin Integrity Issues: Skin Integrity Issues:: Wound VAC Wound Vac: abd  Last BM:  30 ml via ileostomy  Height:   Ht Readings from Last 1 Encounters:  09/06/17 5\' 7"  (1.702 m)    Weight:   Wt Readings from Last 1 Encounters:  09/11/17 286 lb 9.6 oz (130 kg)    Ideal Body Weight:  67.2 kg  BMI:  Body mass index is 44.89 kg/m.  Estimated Nutritional Needs:   Kcal:  1400-1700  Protein:  145-168  grams  Fluid:  > 2 L/day  Maylon Peppers RD, LDN, CNSC [phone] Pager [phone] After Hours Pager

## 2017-09-12 NOTE — Progress Notes (Signed)
On the schedule today for tracheostomy and ABRA wound partial closure.  Kathryne Eriksson. Dahlia Bailiff, MD, [place] [phone] [facility]

## 2017-09-12 NOTE — Transfer of Care (Signed)
Immediate Anesthesia Transfer of Care Note  Patient: Luke Kaufman  Procedure(s) Performed: EXPLORATORY LAPAROTOMY (N/A Abdomen) TRACHEOSTOMY (N/A Neck) APPLICATION OF WOUND VAC  (CHANGE) (N/A Abdomen)  Patient Location: ICU  Anesthesia Type:General  Level of Consciousness: sedated  Airway & Oxygen Therapy: Patient remains intubated per anesthesia plan and Patient placed on Ventilator (see vital sign flow sheet for setting)  Post-op Assessment: Report given to RN and Post -op Vital signs reviewed and stable  Post vital signs: Reviewed and stable  Last Vitals:  Vitals Value Taken Time  BP 102/54 09/12/2017  2:18 PM  Temp    Pulse    Resp 19 09/12/2017  2:21 PM  SpO2    Vitals shown include unvalidated device data.  Last Pain:  Vitals:   09/12/17 1217  TempSrc: Oral  PainSc:          Complications: No apparent anesthesia complications

## 2017-09-12 NOTE — Anesthesia Preprocedure Evaluation (Signed)
Anesthesia Evaluation  Patient identified by MRN, date of birth, ID band Patient unresponsive    Reviewed: Allergy & Precautions, NPO status , Patient's Chart, lab work & pertinent test results, Unable to perform ROS - Chart review only  Airway Mallampati: Intubated  TM Distance: >3 FB     Dental   Pulmonary Current Smoker,     + decreased breath sounds      Cardiovascular hypertension, + Past MI and + Peripheral Vascular Disease   Rhythm:Regular Rate:Normal     Neuro/Psych CVA    GI/Hepatic PUD,   Endo/Other    Renal/GU Renal diseaseRenal artery stenosis     Musculoskeletal   Abdominal   Peds  Hematology   Anesthesia Other Findings   Reproductive/Obstetrics                             Anesthesia Physical  Anesthesia Plan  ASA: III  Anesthesia Plan: General   Post-op Pain Management:    Induction: Intravenous  PONV Risk Score and Plan: Ondansetron and Dexamethasone  Airway Management Planned: Oral ETT and Tracheostomy  Additional Equipment:   Intra-op Plan:   Post-operative Plan: Post-operative intubation/ventilation  Informed Consent: I have reviewed the patients History and Physical, chart, labs and discussed the procedure including the risks, benefits and alternatives for the proposed anesthesia with the patient or authorized representative who has indicated his/her understanding and acceptance.     Plan Discussed with: CRNA and Anesthesiologist  Anesthesia Plan Comments:         Anesthesia Quick Evaluation

## 2017-09-12 NOTE — Progress Notes (Signed)
Cortrak Tube Team Note:  Consult received to place a Cortrak feeding tube.   A 10 F Cortrak tube was placed in the RIGHT nare and secured with a nasal bridle at 90 cm. Per the Cortrak monitor reading the tube tip is post-pyloric.   No x-ray is required. RN may begin using tube.   If the tube becomes dislodged please keep the tube and contact the Cortrak team at [email or web address] ([email or web address]) for replacement.  If after hours and replacement cannot be delayed, place a NG tube and confirm placement with an abdominal x-ray.   Kerman Passey MS, RD, [place], [place] [phone] Pager  [phone] Weekend/On-Call Pager

## 2017-09-12 NOTE — Progress Notes (Signed)
PULMONARY / CRITICAL CARE MEDICINE   Name: Luke Kaufman MRN: [id number] DOB: [birth year]    ADMISSION DATE:  09/03/2017 CONSULTATION DATE:  5/16  REFERRING MD:  Oneida Alar  CHIEF COMPLAINT:  Post op Vent management  HISTORY OF PRESENT ILLNESS:   67 y/o male presented on 5/15 with abdominal pain, had an ischemic right colon.  Had a R hemicolectomy and eventually an ileostomy.    SUBJECTIVE:   Some agitation reported from overnight Plan to go back to the operating room today  VITAL SIGNS: BP (!) 120/52   Pulse 75   Temp 99.1 F (37.3 C) (Axillary)   Resp 20   Ht 5\' 7"  (1.702 m)   Wt 130 kg (286 lb 9.6 oz)   SpO2 94%   BMI 44.89 kg/m   HEMODYNAMICS: CVP:  [5 mmHg-21 mmHg] 9 mmHg  VENTILATOR SETTINGS: Vent Mode: PRVC FiO2 (%):  [40 %-80 %] 50 % Set Rate:  [20 bmp] 20 bmp Vt Set:  [540 mL] 540 mL PEEP:  [8 cmH20] 8 cmH20 Plateau Pressure:  [15 cmH20-20 cmH20] 17 cmH20  INTAKE / OUTPUT: I/O last 3 completed shifts: In: 2195.6 [I.V.:1675.6; NG/GT:370; IV Piggyback:150] Out: 4098 [Urine:3305; Emesis/NG output:150; Drains:2625; Stool:80]  PHYSICAL EXAMINATION: General: Obese, ill-appearing, sedated, ventilated HEENT: Endotracheal tube in good position, oropharynx otherwise clear Neuro: Completely sedated, unresponsive to voice, he did grimace with stimulation, pain CV: Regular, heart rate 70, no murmur PULM: Decreased breath sounds laterally GI: Soft, large midline incision with a wound VAC in place, no surrounding erythema or drainage, hypoactive bowel sounds  Extremities: 1+ pretibial pitting edema Skin: No rash  LABS:  BMET Recent Labs  Lab 09/10/17 0500 09/11/17 0320 09/12/17 0454  NA 140 142 143  K 3.3* 3.8 3.6  CL 105 104 104  CO2 26 28 29   BUN 14 15 17   CREATININE 1.44* 1.65* 1.69*  GLUCOSE 100* 110* 117*    Electrolytes Recent Labs  Lab 09/09/17 0355 09/09/17 1320 09/10/17 0500 09/11/17 0320 09/12/17 0454  CALCIUM 6.3* 7.0* 7.0* 7.4* 7.4*   MG 1.8 2.4 2.1  --  2.0  PHOS 1.6* 2.4* 2.7  --   --     CBC Recent Labs  Lab 09/09/17 0355 09/10/17 0500 09/12/17 0454  WBC 11.2* 10.3 12.9*  HGB 9.2* 9.2* 9.2*  HCT 27.8* 28.3* 29.1*  PLT 154 175 146*    Coag's No results for input(s): APTT, INR in the last 168 hours.  Sepsis Markers Recent Labs  Lab 09/05/17 1021 09/06/17 0630 09/10/17 1007  LATICACIDVEN 1.4  --  2.11*  PROCALCITON  --  2.10  --     ABG Recent Labs  Lab 09/08/17 0210 09/08/17 0842 09/10/17 1014  PHART 7.506* 7.423 7.412  PCO2ART 40.2 39.6 40.3  PO2ART 79.0* 89.0 119.0*    Liver Enzymes Recent Labs  Lab 09/11/17 0320  AST 30  ALT 27  ALKPHOS 67  BILITOT 0.8  ALBUMIN 1.7*    Cardiac Enzymes Recent Labs  Lab 09/05/17 1352 09/05/17 2035 09/06/17 0148  TROPONINI 0.36* 0.27* 0.29*    Glucose Recent Labs  Lab 09/11/17 0826 09/11/17 1221 09/11/17 1626 09/11/17 1952 09/11/17 2327 09/12/17 0458  GLUCAP 116* 131* 121* 101* 110* 103*    Imaging Dg Chest Port 1 View  Result Date: 09/11/2017 CLINICAL DATA:  Shortness of breath EXAM: PORTABLE CHEST 1 VIEW COMPARISON:  09/10/2017 FINDINGS: Endotracheal tube, right central line and NG tube remain in place, unchanged. There is bibasilar atelectasis. Heart is  upper limits normal in size. No visible effusions or acute bony abnormality. IMPRESSION: Bibasilar atelectasis. Electronically Signed   By: Rolm Baptise M.D.   On: 09/11/2017 10:37     STUDIES:  5/15 CT ab/pelvis > portal venous gas in liver, sigmoid colon with diverticular disease, atrophic left kidney  CULTURES: 5/17 resp > e coli resistant to multiple antibiotics 5/17 blood >  negative  ANTIBIOTICS: 5/15 zosyn > 5/19  5/19 ceftriaxone >   SIGNIFICANT EVENTS: 5/16 mesenteric angiogram and aorto to SMA bypass 5/16 R hemicolectomy 5/18 ileostomy 5/19 abdominal washout and partial fascial closure 5/21 TF started, hypoxia with turning  5/22 Return to OR for  exploration, attempted closure > unable  LINES/TUBES: 5/16 R  CVL >  5/16 ETT >  5/18 R femoral arterial line > 5/24  DISCUSSION: 67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 Fascia remains open  ASSESSMENT / PLAN:  PULMONARY A: Acute respiratory failure with hypoxemia Acute pulmonary edema P:   Continue current PRVC 8 cc/kg. Spontaneous breathing, lightening sedation have been limited by he is tenuous abdomen, potential for loss of integrity with straining, coughing, etc. Diuresis tolerated Wean FiO2 and PEEP as tolerated. Discussed with Dr. Hulen Skains on 5/23, 5/24.  Agree that he will benefit from tracheostomy in order to progress.  He will have this done when he goes back to the operating room today.  CARDIOVASCULAR A:  Shock - suspect sedation related +/-sepsis Tachycardia  P:  Continue to wean norepinephrine, I suspect that sedation is a significant contributor to his hypotension  RENAL A:   Hypokalemia, resolved AKI  Hypomagnesemia Hypophosphatemia  P:   Minimize IV fluids He has tolerated diuresis for the last 3 days with out a significant increase in his serum creatinine. Repeat Lasix 40 mg every 12 again on 5/24 and follow serum creatinine, urine output Replace electrolytes as indicated  GASTROINTESTINAL A:    Ischemic colon, s/p hemicolectomy and ileostomy Abdomen remains open, wound VAC in place. P:   Discussed with Dr. Hulen Skains 5/23 and 5/24, he will go for retention device that will hopefully slowly allow fascia to be closed. Restart trickle tube feeding postoperatively as long as this is okay with CCS Continue famotidine  HEMATOLOGIC A:   Anemia - without bleeding P:  Follow CBC  INFECTIOUS A:   Ischemic bowel, s/p abdominal washout several times, appeared clear on 5/20 E.coli HCAP P:   Ceftriaxone course completed 5/23 Follow clinically off abx CVC is 20 days old. Will ask for PICC  placement so it can be removed.   ENDOCRINE A:   Hyperglycemia P:   Continue SSI per protocol  NEUROLOGIC A:   Sedation needs for vent synchrony P:   RASS Goal: -2 given risk for perturbing abdomen Fentanyl infusion Consider start precedex 5/25 to allow fentanyl to be weaned.  PRN Versed available   FAMILY  - Updates: Sister Bethena Roys) updated via phone 5/22 by Dr Lake Bells, on 5/24 by Dr Hulen Skains.  She is his Media planner (not married, adult child who is special needs).     Independent critical care time 33 minutes  Baltazar Apo, MD, PhD 09/12/2017, 8:48 AM [facility] [phone] or if no answer [phone]

## 2017-09-13 ENCOUNTER — Inpatient Hospital Stay (HOSPITAL_COMMUNITY): Payer: PPO

## 2017-09-13 LAB — GLUCOSE, CAPILLARY
GLUCOSE-CAPILLARY: 117 mg/dL — AB (ref 65–99)
GLUCOSE-CAPILLARY: 126 mg/dL — AB (ref 65–99)
GLUCOSE-CAPILLARY: 153 mg/dL — AB (ref 65–99)
Glucose-Capillary: 134 mg/dL — ABNORMAL HIGH (ref 65–99)
Glucose-Capillary: 109 mg/dL — ABNORMAL HIGH (ref 65–99)
Glucose-Capillary: 137 mg/dL — ABNORMAL HIGH (ref 65–99)

## 2017-09-13 LAB — BASIC METABOLIC PANEL
Anion gap: 9 (ref 5–15)
BUN: 23 mg/dL — AB (ref 6–20)
CALCIUM: 7.6 mg/dL — AB (ref 8.9–10.3)
CHLORIDE: 107 mmol/L (ref 101–111)
CO2: 29 mmol/L (ref 22–32)
CREATININE: 1.61 mg/dL — AB (ref 0.61–1.24)
GFR calc non Af Amer: 43 mL/min — ABNORMAL LOW (ref >60)
GFR, EST AFRICAN AMERICAN: 49 mL/min — AB (ref >60)
GLUCOSE: 143 mg/dL — AB (ref 65–99)
Potassium: 3.8 mmol/L (ref 3.5–5.1)
Sodium: 145 mmol/L (ref 135–145)

## 2017-09-13 LAB — CBC
HEMATOCRIT: 28.7 % — AB (ref 39.0–52.0)
Hemoglobin: 9.2 g/dL — ABNORMAL LOW (ref 13.0–17.0)
MCH: 29.8 pg (ref 26.0–34.0)
MCHC: 32.1 g/dL (ref 30.0–36.0)
MCV: 92.9 fL (ref 78.0–100.0)
Platelets: 149 10*3/uL — ABNORMAL LOW (ref 150–400)
RBC: 3.09 MIL/uL — ABNORMAL LOW (ref 4.22–5.81)
RDW: 14 % (ref 11.5–15.5)
WBC: 10.6 10*3/uL — ABNORMAL HIGH (ref 4.0–10.5)

## 2017-09-13 LAB — MAGNESIUM: Magnesium: 2.1 mg/dL (ref 1.7–2.4)

## 2017-09-13 MED ORDER — DEXMEDETOMIDINE HCL IN NACL 200 MCG/50ML IV SOLN
0.4000 ug/kg/h | INTRAVENOUS | Status: DC
  Administered 2017-09-13 (×3): 0.9 ug/kg/h via INTRAVENOUS
  Administered 2017-09-13: 0.4 ug/kg/h via INTRAVENOUS
  Administered 2017-09-13 (×3): 0.9 ug/kg/h via INTRAVENOUS
  Filled 2017-09-13 (×2): qty 100
  Filled 2017-09-13 (×3): qty 50

## 2017-09-13 MED ORDER — VITAL HIGH PROTEIN PO LIQD: 1000.0000 mL | ORAL | Status: AC

## 2017-09-13 MED ORDER — DEXMEDETOMIDINE HCL IN NACL 400 MCG/100ML IV SOLN
0.4000 ug/kg/h | INTRAVENOUS | Status: DC
  Administered 2017-09-14: 0.9 ug/kg/h via INTRAVENOUS
  Administered 2017-09-14: 1 ug/kg/h via INTRAVENOUS
  Administered 2017-09-14 (×2): 1.2 ug/kg/h via INTRAVENOUS
  Administered 2017-09-14: 0.6 ug/kg/h via INTRAVENOUS
  Administered 2017-09-14: 0.9 ug/kg/h via INTRAVENOUS
  Administered 2017-09-15 (×2): 1.2 ug/kg/h via INTRAVENOUS
  Filled 2017-09-13 (×10): qty 100

## 2017-09-13 MED ORDER — FUROSEMIDE 10 MG/ML IJ SOLN
40.0000 mg | Freq: Two times a day (BID) | INTRAMUSCULAR | Status: AC
  Administered 2017-09-13 (×2): 40 mg via INTRAVENOUS
  Filled 2017-09-13 (×2): qty 4

## 2017-09-13 MED ORDER — POTASSIUM CHLORIDE 20 MEQ/15ML (10%) PO SOLN
40.0000 meq | Freq: Two times a day (BID) | ORAL | Status: AC
  Administered 2017-09-13 (×2): 40 meq via ORAL
  Filled 2017-09-13 (×2): qty 30

## 2017-09-13 NOTE — Progress Notes (Signed)
Telephoned consent obtained for exploratory laparotomy for tomorrow's procedure, 09/14/17, w/sister Bethena Roys  2nd [facility], RN present and spoke w/sister as well.

## 2017-09-13 NOTE — Progress Notes (Signed)
   Daily Progress Note  Pt remains on vent.  Trach and abd washout yesterday.  Reportedly bowel looked viable as did the ileostomy.  Abd VAC in place.  - Nothing more to add to care at this time - Dr. Oneida Alar will be back on Tuesday   Adele Barthel, MD, [facility] [place] Office: [phone] Pager: [phone]  09/13/2017, 1:13 PM

## 2017-09-13 NOTE — Progress Notes (Signed)
Paged Kinsinger, MD to make him aware that upon patients second vomiting and wretching episode, pt pulled out his cortrak tube. MD verbal order to place NG tube to low intermittent suction. Will place and confirm with x-ray. Will continue to monitor closely.  Lucius Conn, RN

## 2017-09-13 NOTE — Progress Notes (Signed)
Paged Gurney Maxin, MD about patient vomiting and straining multiple times yellow mucous mixed with tube feeds. Zofran given to patient and MD verbal order  To hold tube feeds now and  place NG tube if pt vomits again. Will continue to monitor closely.  Lucius Conn, RN

## 2017-09-13 NOTE — Progress Notes (Signed)
Progress Note: General Surgery Service   Assessment/Plan: Patient Active Problem List   Diagnosis Date Noted  . Abdominal pain 09/04/2017  . PUD (peptic ulcer disease) 09/04/2017  . Mesenteric ischemia ([place]) 09/04/2017  . 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 Left arm numbness 12/14/2015  . Numbness 12/14/2015  . Bilateral carotid artery stenosis 11/22/2015  . Recurrent major depressive disorder in partial remission ([place]) 10/19/2015  . Mitral valve prolapse 09/07/2015  . Vitamin D deficiency 08/31/2015  . Arterial atherosclerosis 04/10/2015  . Cataract 04/10/2015  . Cerebrovascular accident, old 04/10/2015  . Gonalgia 04/10/2015  . HLD (hyperlipidemia) 04/10/2015  . 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 Renal artery stenosis ([place]) 04/10/2015  . History of non-ST elevation myocardial infarction (NSTEMI) 04/10/2015  . Anemia associated with acute blood loss 08/24/2014  . Thrombocytopenia ([place])   . Hypokalemia   . Essential hypertension 08/09/2014  . Chest pain 08/09/2014  . Near syncope 08/09/2014  . CKD (chronic kidney disease) stage 3, GFR 30-59 ml/min (HCC) 08/09/2014  . Tobacco abuse 08/09/2014  . Upper GI bleeding   . Gastric ulcer with hemorrhage   . Acute blood loss anemia   . CVA (cerebral infarction) 05/16/2013  . HTN (hypertension) 05/16/2013  . 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   s/p Procedure(s): EXPLORATORY LAPAROTOMY TRACHEOSTOMY APPLICATION OF WOUND VAC  (CHANGE) 09/12/2017 -ok for trach  Collar -ok to increase tube feeds to goal -plan for return to operating room 5/27 -continue wound vac    LOS: 9 days  Chief Complaint/Subjective: Tracheostomy placed yesterday, doing well breathing spontaneously  Objective: Vital signs in last 24 hours: Temp:  [97.4 F (36.3 C)-99.2 F (37.3 C)] 98.5 F (36.9 C) (05/25 0747) Pulse Rate:  [58-110] 99 (05/25 0745) Resp:  [0-22] 11 (05/25 0745) BP: (97-159)/(50-71) 118/68 (05/25  0745) SpO2:  [91 %-100 %] 100 % (05/25 0745) FiO2 (%):  [40 %-50 %] 40 % (05/25 0745) Weight:  [122.1 kg (269 lb 2.9 oz)] 122.1 kg (269 lb 2.9 oz) (05/25 0500) Last BM Date: 09/11/17  Intake/Output from previous day: 05/24 0701 - 05/25 0700 In: 1891.6 [I.V.:1566.6; NG/GT:225; IV Piggyback:100] Out: 3315 [Urine:2560; Drains:600; Stool:130; Blood:25] Intake/Output this shift: No intake/output data recorded. Cardiovascular: tachycardic  Abd: soft, blue vac in place and functioning, no guarding  Neuro: GCS 11t  Lab Results: CBC  Recent Labs    09/12/17 0454 09/13/17 0408  WBC 12.9* 10.6*  HGB 9.2* 9.2*  HCT 29.1* 28.7*  PLT 146* 149*   BMET Recent Labs    09/12/17 0454 09/13/17 0408  NA 143 145  K 3.6 3.8  CL 104 107  CO2 29 29  GLUCOSE 117* 143*  BUN 17 23*  CREATININE 1.69* 1.61*  CALCIUM 7.4* 7.6*   PT/INR No results for input(s): LABPROT, INR in the last 72 hours. ABG Recent Labs    09/10/17 1014  PHART 7.412  HCO3 25.5    Studies/Results:  Anti-infectives: Anti-infectives (From admission, onward)   Start     Dose/Rate Route Frequency Ordered Stop   09/07/17 1545  cefTRIAXone (ROCEPHIN) 2 g in sodium chloride 0.9 % 100 mL IVPB     2 g 200 mL/hr over 30 Minutes Intravenous Every 24 hours 09/07/17 1543 09/11/17 1605   09/04/17 1400  piperacillin-tazobactam (ZOSYN) IVPB 3.375 g  Status:  Discontinued     3.375 g 12.5 mL/hr over 240 Minutes Intravenous Every 8 hours 09/04/17 1157 09/07/17 1541   09/04/17 1100  ceFAZolin (ANCEF) IVPB 2g/100  mL premix  Status:  Discontinued     2 g 200 mL/hr over 30 Minutes Intravenous Every 8 hours 09/04/17 1035 09/04/17 1157   09/04/17 0630  piperacillin-tazobactam (ZOSYN) IVPB 3.375 g  Status:  Discontinued     3.375 g 100 mL/hr over 30 Minutes Intravenous  Once 09/04/17 0617 09/04/17 0626   09/04/17 0619  piperacillin-tazobactam (ZOSYN) IVPB 3.375 g     3.375 g 100 mL/hr over 30 Minutes Intravenous 60 min pre-op  09/04/17 0620 09/04/17 0717   09/04/17 0030  piperacillin-tazobactam (ZOSYN) IVPB 3.375 g     3.375 g 100 mL/hr over 30 Minutes Intravenous  Once 09/04/17 0024 09/04/17 0144      Medications: Scheduled Meds: . chlorhexidine gluconate (MEDLINE KIT)  15 mL Mouth Rinse BID  . docusate  100 mg Per Tube Daily  . feeding supplement (PRO-STAT SUGAR FREE 64)  30 mL Per Tube TID  . insulin aspart  0-15 Units Subcutaneous Q4H  . ipratropium-albuterol  3 mL Nebulization Q6H  . mouth rinse  15 mL Mouth Rinse 10 times per day  . sodium chloride flush  10-40 mL Intracatheter Q12H   Continuous Infusions: . sodium chloride    . famotidine (PEPCID) IV Stopped (09/12/17 2225)  . feeding supplement (VITAL HIGH PROTEIN) 10 mL/hr at 09/12/17 1900  . fentaNYL infusion INTRAVENOUS 200 mcg/hr (09/13/17 0537)  . magnesium sulfate 1 - 4 g bolus IVPB    . norepinephrine (LEVOPHED) Adult infusion Stopped (09/13/17 0445)   PRN Meds:.Place/Maintain arterial line **AND** sodium chloride, acetaminophen **OR** acetaminophen, alum & mag hydroxide-simeth, bisacodyl, fentaNYL (SUBLIMAZE) injection, hydrALAZINE, magnesium sulfate 1 - 4 g bolus IVPB, midazolam, ondansetron **OR** ondansetron (ZOFRAN) IV, phenol, sodium chloride flush  Mickeal Skinner, MD Pg# [phone] [facility]

## 2017-09-13 NOTE — Progress Notes (Signed)
PULMONARY / CRITICAL CARE MEDICINE   Name: Luke Kaufman MRN: [id number] DOB: 1950-12-31    ADMISSION DATE:  09/03/2017 CONSULTATION DATE:  5/16  REFERRING MD:  Oneida Alar  CHIEF COMPLAINT:  Post op Vent management  HISTORY OF PRESENT ILLNESS:   67 y/o male presented on 5/15 with abdominal pain, had an ischemic right colon.  Had a R hemicolectomy and eventually an ileostomy.    SUBJECTIVE:   Continued Agitation with WUA on Fentanyl @ 100 mcg Intermittent use of Levo through the night ( Related to sedation) Trached 5/24 Wound vac 500 cc  Urine 700 cc Iliostomy 30 cc CVP 7  VITAL SIGNS: BP 118/68   Pulse 99   Temp 98.5 F (36.9 C) (Oral)   Resp 11   Ht 5\' 7"  (1.702 m)   Wt 269 lb 2.9 oz (122.1 kg)   SpO2 100%   BMI 42.16 kg/m   HEMODYNAMICS: CVP:  [4 mmHg-14 mmHg] 4 mmHg  VENTILATOR SETTINGS: Vent Mode: CPAP;PSV FiO2 (%):  [40 %-50 %] 40 % Set Rate:  [20 bmp] 20 bmp Vt Set:  [530 mL-540 mL] 530 mL PEEP:  [5 cmH20-8 cmH20] 8 cmH20 Pressure Support:  [5 cmH20] 5 cmH20 Plateau Pressure:  [16 cmH20-21 cmH20] 16 cmH20  INTAKE / OUTPUT: I/O last 3 completed shifts: In: 2422 [I.V.:2017; NG/GT:255; IV Piggyback:150] Out: 4782 [Urine:3540; Drains:1475; Stool:160; Blood:25]  PHYSICAL EXAMINATION: General: Obese, chronically ill appearing male,  Sedated, trached and ventilated, following commands HEENT: Trach is stable and secure , bloody secretions, thick neck Neuro: Light sedation, awake and tracking, following commands, able to communicate pain CV: S1, S2, RRR, No RMG PULM: Bilateral excursion, diminished per bases due to large abdomen GI: Soft, NT, large midline incision with a wound VAC in place, no surrounding erythema or drainage, hypoactive bowel sounds , serosang drainage Extremities: 1+ pretibial pitting edema, Pulses confirmed with doppler, no obvious deformities Skin: No rash, or lesions noted, warm, dry and intact  LABS:  BMET Recent Labs  Lab  09/11/17 0320 09/12/17 0454 09/13/17 0408  NA 142 143 145  K 3.8 3.6 3.8  CL 104 104 107  CO2 28 29 29   BUN 15 17 23*  CREATININE 1.65* 1.69* 1.61*  GLUCOSE 110* 117* 143*    Electrolytes Recent Labs  Lab 09/09/17 0355 09/09/17 1320 09/10/17 0500 09/11/17 0320 09/12/17 0454 09/13/17 0408  CALCIUM 6.3* 7.0* 7.0* 7.4* 7.4* 7.6*  MG 1.8 2.4 2.1  --  2.0 2.1  PHOS 1.6* 2.4* 2.7  --   --   --     CBC Recent Labs  Lab 09/10/17 0500 09/12/17 0454 09/13/17 0408  WBC 10.3 12.9* 10.6*  HGB 9.2* 9.2* 9.2*  HCT 28.3* 29.1* 28.7*  PLT 175 146* 149*    Coag's No results for input(s): APTT, INR in the last 168 hours.  Sepsis Markers Recent Labs  Lab 09/10/17 1007  LATICACIDVEN 2.11*    ABG Recent Labs  Lab 09/08/17 0210 09/08/17 0842 09/10/17 1014  PHART 7.506* 7.423 7.412  PCO2ART 40.2 39.6 40.3  PO2ART 79.0* 89.0 119.0*    Liver Enzymes Recent Labs  Lab 09/11/17 0320  AST 30  ALT 27  ALKPHOS 67  BILITOT 0.8  ALBUMIN 1.7*    Cardiac Enzymes No results for input(s): TROPONINI, PROBNP in the last 168 hours.  Glucose Recent Labs  Lab 09/12/17 0744 09/12/17 1214 09/12/17 1606 09/12/17 1922 09/12/17 2329 09/13/17 0339  GLUCAP 100* 104* 118* 147* 139* 134*  Imaging Dg Chest Port 1 View  Result Date: 09/13/2017 CLINICAL DATA:  Acute respiratory failure. EXAM: PORTABLE CHEST 1 VIEW COMPARISON:  09/11/2017. FINDINGS: ET tube has been replaced with a tracheostomy. Tip lies 4.5 cm above carina. Central venous catheter tip, appears to lie within the mid RIGHT atrium, potentially advanced from earlier radiograph. Cardiomediastinal shadow unchanged. Thoracic atherosclerosis. RIGHT greater than LEFT basilar opacities likely atelectasis with small effusions slightly worse. Feeding tube in the stomach. IMPRESSION: Tracheostomy in satisfactory position, with no pneumothorax. Bibasilar opacities, could be slightly worse. Potential advancement of central  venous catheter, now with tip in the mid RIGHT atrium. Electronically Signed   By: Luke Kaufman M.D.   On: 09/13/2017 07:26   [place] Ekg Site Rite  Result Date: 09/12/2017 If Site Rite image not attached, placement could not be confirmed due to current cardiac rhythm.    STUDIES:  5/15 CT ab/pelvis > portal venous gas in liver, sigmoid colon with diverticular disease, atrophic left kidney  CULTURES: 5/17 resp > e coli resistant to multiple antibiotics 5/17 blood >  negative  ANTIBIOTICS: 5/15 zosyn > 5/19  5/19 ceftriaxone > 5/23   SIGNIFICANT EVENTS: 5/16 mesenteric angiogram and aorto to SMA bypass 5/16 R hemicolectomy 5/18 ileostomy 5/19 abdominal washout and partial fascial closure 5/21 TF started, hypoxia with turning  5/22 Return to OR for exploration, attempted closure > unable 5/24>> Trach  LINES/TUBES: 5/16 R Norco CVL > 5/24 5/16 ETT >  5/18 R femoral arterial line > 5/24 5/24 LUA PICC  DISCUSSION: 67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 Fascia remains open, agitation barrier to weaning. Trached 5/24  ASSESSMENT / PLAN:  PULMONARY A: Acute respiratory failure with hypoxemia Acute pulmonary edema CPAP/PS 5/25 ( 40%, Peep 8, PS 5) P:   Continue current PRVC 8 cc/kg. Spontaneous breathing, lightening sedation have been limited by he is tenuous abdomen, potential for loss of integrity with straining, coughing, etc., and agitation Continue Diuresis tolerated Wean FiO2 and PEEP as tolerated. Trached 5/24 with hope this will facilitate weaning  CARDIOVASCULAR A:  Shock - suspect sedation related +/-sepsis Tachycardia  CVP = 7 P:  Tele Continue to wean norepinephrine ( Comes off when sedation lightened) MAP Goal > 65 mm Hg  RENAL A:   Hypokalemia, resolved AKI  Renal function stable P:   Minimize IV fluids Tolerating diuresis Will repeat Lasix 40 BID on  5/25>> renal function  stable Replace electrolytes as indicated Follow BMET  GASTROINTESTINAL A:    Ischemic colon, s/p hemicolectomy and ileostomy Abdomen remains open, wound VAC in place. P:   Discussed with Dr. Hulen Skains 5/23 and 5/24, he will go for retention device that will hopefully slowly allow fascia to be closed. Monitor drainage Tolerating trickle feeds Continue famotidine  HEMATOLOGIC A:   Anemia  Scant bleeding from around trach P:  Trend CBC Monitor for any obvious bleeding  INFECTIOUS A:   Ischemic bowel, s/p abdominal washout several times, appeared clear on 5/20 E.coli HCAP CVC d/c 5/25>> New PICC 5/24 P:   Ceftriaxone course completed 5/23 Follow clinically off abx>> WBC trending down Trend fever and WBC   ENDOCRINE A:   Hyperglycemia P:   Continue SSI per protocol  NEUROLOGIC A:   Sedation needs for vent synchrony P:   RASS Goal: -2 given risk for perturbing abdomen Fentanyl infusion Consider precedex to allow for liberation from fentanyl  PRN Versed available   FAMILY  -  Updates: Sister Luke Kaufman) updated via phone 5/22 by Dr Lake Bells, on 5/24 by Dr Hulen Skains.  She is his Media planner (not married, adult child who is special needs).  No Family updates 5/25      Magdalen Spatz, AGACNP-BC 09/13/2017, 8:00 AM [facility] Pager [phone]

## 2017-09-14 ENCOUNTER — Encounter (HOSPITAL_COMMUNITY)
Admission: EM | Disposition: A | Discharge status: Nursing Home (Includes ICF) | Payer: Self-pay | Source: Home / Self Care | Attending: Pulmonary Disease

## 2017-09-14 ENCOUNTER — Inpatient Hospital Stay (HOSPITAL_COMMUNITY): Payer: PPO | Admitting: Anesthesiology

## 2017-09-14 ENCOUNTER — Inpatient Hospital Stay (HOSPITAL_COMMUNITY): Payer: PPO

## 2017-09-14 HISTORY — PX: LAPAROTOMY: SHX154

## 2017-09-14 HISTORY — PX: APPLICATION OF WOUND VAC: SHX5189

## 2017-09-14 LAB — GLUCOSE, CAPILLARY
GLUCOSE-CAPILLARY: 123 mg/dL — AB (ref 65–99)
GLUCOSE-CAPILLARY: 115 mg/dL — AB (ref 65–99)
Glucose-Capillary: 123 mg/dL — ABNORMAL HIGH (ref 65–99)
Glucose-Capillary: 126 mg/dL — ABNORMAL HIGH (ref 65–99)
Glucose-Capillary: 142 mg/dL — ABNORMAL HIGH (ref 65–99)
Glucose-Capillary: 143 mg/dL — ABNORMAL HIGH (ref 65–99)
Glucose-Capillary: 144 mg/dL — ABNORMAL HIGH (ref 65–99)
Glucose-Capillary: 147 mg/dL — ABNORMAL HIGH (ref 65–99)

## 2017-09-14 LAB — PHOSPHORUS: Phosphorus: 2.6 mg/dL (ref 2.5–4.6)

## 2017-09-14 LAB — BASIC METABOLIC PANEL
Anion gap: 10 (ref 5–15)
Anion gap: 10 (ref 5–15)
BUN: 26 mg/dL — ABNORMAL HIGH (ref 6–20)
BUN: 25 mg/dL — AB (ref 6–20)
CHLORIDE: 106 mmol/L (ref 101–111)
CO2: 27 mmol/L (ref 22–32)
CO2: 26 mmol/L (ref 22–32)
CREATININE: 1.53 mg/dL — AB (ref 0.61–1.24)
CREATININE: 1.4 mg/dL — AB (ref 0.61–1.24)
Calcium: 7.5 mg/dL — ABNORMAL LOW (ref 8.9–10.3)
Calcium: 7.4 mg/dL — ABNORMAL LOW (ref 8.9–10.3)
Chloride: 109 mmol/L (ref 101–111)
GFR calc Af Amer: 53 mL/min — ABNORMAL LOW (ref >60)
GFR calc non Af Amer: 50 mL/min — ABNORMAL LOW (ref >60)
GFR, EST AFRICAN AMERICAN: 59 mL/min — AB (ref >60)
GFR, EST NON AFRICAN AMERICAN: 45 mL/min — AB (ref >60)
GLUCOSE: 132 mg/dL — AB (ref 65–99)
Glucose, Bld: 144 mg/dL — ABNORMAL HIGH (ref 65–99)
POTASSIUM: 3.4 mmol/L — AB (ref 3.5–5.1)
Potassium: 3.9 mmol/L (ref 3.5–5.1)
SODIUM: 143 mmol/L (ref 135–145)
SODIUM: 145 mmol/L (ref 135–145)

## 2017-09-14 LAB — CBC
HCT: 29.5 % — ABNORMAL LOW (ref 39.0–52.0)
Hemoglobin: 9.4 g/dL — ABNORMAL LOW (ref 13.0–17.0)
MCH: 29.8 pg (ref 26.0–34.0)
MCHC: 31.9 g/dL (ref 30.0–36.0)
MCV: 93.7 fL (ref 78.0–100.0)
PLATELETS: 171 10*3/uL (ref 150–400)
RBC: 3.15 MIL/uL — AB (ref 4.22–5.81)
RDW: 14.2 % (ref 11.5–15.5)
WBC: 8.1 10*3/uL (ref 4.0–10.5)

## 2017-09-14 LAB — MAGNESIUM: Magnesium: 2.3 mg/dL (ref 1.7–2.4)

## 2017-09-14 SURGERY — LAPAROTOMY, EXPLORATORY
Anesthesia: General | Site: Abdomen

## 2017-09-14 MED ORDER — MIDAZOLAM HCL 2 MG/2ML IJ SOLN
INTRAMUSCULAR | Status: AC
  Filled 2017-09-14: qty 2

## 2017-09-14 MED ORDER — DEXAMETHASONE SODIUM PHOSPHATE 10 MG/ML IJ SOLN
INTRAMUSCULAR | Status: DC | PRN
  Administered 2017-09-14: 10 mg via INTRAVENOUS

## 2017-09-14 MED ORDER — FENTANYL CITRATE (PF) 100 MCG/2ML IJ SOLN
INTRAMUSCULAR | Status: DC | PRN
  Administered 2017-09-14 (×2): 50 ug via INTRAVENOUS

## 2017-09-14 MED ORDER — POTASSIUM CHLORIDE 10 MEQ/50ML IV SOLN
10.0000 meq | INTRAVENOUS | Status: AC
  Administered 2017-09-14 (×3): 10 meq via INTRAVENOUS
  Filled 2017-09-14 (×3): qty 50

## 2017-09-14 MED ORDER — 0.9 % SODIUM CHLORIDE (POUR BTL) OPTIME
TOPICAL | Status: DC | PRN
  Administered 2017-09-14: 5000 mL

## 2017-09-14 MED ORDER — PHENYLEPHRINE HCL 10 MG/ML IJ SOLN
INTRAVENOUS | Status: DC | PRN
  Administered 2017-09-14: 25 ug/min via INTRAVENOUS

## 2017-09-14 MED ORDER — LACTATED RINGERS IV SOLN
INTRAVENOUS | Status: DC | PRN
  Administered 2017-09-14: 10:00:00 via INTRAVENOUS

## 2017-09-14 MED ORDER — FENTANYL CITRATE (PF) 250 MCG/5ML IJ SOLN
INTRAMUSCULAR | Status: AC
  Filled 2017-09-14: qty 5

## 2017-09-14 MED ORDER — VITAL HIGH PROTEIN PO LIQD
1000.0000 mL | ORAL | Status: DC
  Administered 2017-09-14 – 2017-09-15 (×3): 1000 mL

## 2017-09-14 MED ORDER — ROCURONIUM BROMIDE 100 MG/10ML IV SOLN
INTRAVENOUS | Status: DC | PRN
  Administered 2017-09-14: 20 mg via INTRAVENOUS
  Administered 2017-09-14: 50 mg via INTRAVENOUS

## 2017-09-14 MED ORDER — MIDAZOLAM HCL 5 MG/5ML IJ SOLN
INTRAMUSCULAR | Status: DC | PRN
  Administered 2017-09-14: 2 mg via INTRAVENOUS

## 2017-09-14 SURGICAL SUPPLY — 41 items
BLADE CLIPPER SURG (BLADE) IMPLANT
CANISTER SUCT 3000ML PPV (MISCELLANEOUS) ×3 IMPLANT
CANISTER WOUNDNEG PRESSURE 500 (CANNISTER) ×2 IMPLANT
CHLORAPREP W/TINT 26ML (MISCELLANEOUS) ×3 IMPLANT
COVER SURGICAL LIGHT HANDLE (MISCELLANEOUS) ×3 IMPLANT
DRAPE LAPAROSCOPIC ABDOMINAL (DRAPES) ×3 IMPLANT
DRAPE WARM FLUID 44X44 (DRAPE) ×3 IMPLANT
DRSG EMULSION OIL 3X3 NADH (GAUZE/BANDAGES/DRESSINGS) ×2 IMPLANT
DRSG OPSITE POSTOP 4X10 (GAUZE/BANDAGES/DRESSINGS) IMPLANT
DRSG OPSITE POSTOP 4X8 (GAUZE/BANDAGES/DRESSINGS) IMPLANT
ELECT BLADE 6.5 EXT (BLADE) IMPLANT
ELECT CAUTERY BLADE 6.4 (BLADE) ×3 IMPLANT
ELECT REM PT RETURN 9FT ADLT (ELECTROSURGICAL) ×3
ELECTRODE REM PT RTRN 9FT ADLT (ELECTROSURGICAL) ×1 IMPLANT
GLOVE BIO SURGEON STRL SZ7.5 (GLOVE) ×3 IMPLANT
GLOVE BIOGEL PI IND STRL 8 (GLOVE) ×1 IMPLANT
GLOVE BIOGEL PI INDICATOR 8 (GLOVE) ×2
GOWN STRL REUS W/ TWL LRG LVL3 (GOWN DISPOSABLE) ×1 IMPLANT
GOWN STRL REUS W/ TWL XL LVL3 (GOWN DISPOSABLE) ×1 IMPLANT
GOWN STRL REUS W/TWL LRG LVL3 (GOWN DISPOSABLE) ×2
GOWN STRL REUS W/TWL XL LVL3 (GOWN DISPOSABLE) ×2
KIT BASIN OR (CUSTOM PROCEDURE TRAY) ×3 IMPLANT
KIT TURNOVER KIT B (KITS) ×3 IMPLANT
LIGASURE IMPACT 36 18CM CVD LR (INSTRUMENTS) IMPLANT
NS IRRIG 1000ML POUR BTL (IV SOLUTION) ×6 IMPLANT
PACK GENERAL/GYN (CUSTOM PROCEDURE TRAY) ×3 IMPLANT
PAD ARMBOARD 7.5X6 YLW CONV (MISCELLANEOUS) ×3 IMPLANT
SPECIMEN JAR LARGE (MISCELLANEOUS) IMPLANT
SPONGE ABDOMINAL VAC ABTHERA (MISCELLANEOUS) ×2 IMPLANT
SPONGE LAP 18X18 X RAY DECT (DISPOSABLE) IMPLANT
STAPLER VISISTAT 35W (STAPLE) ×3 IMPLANT
SUCTION POOLE TIP (SUCTIONS) ×3 IMPLANT
SUT PDS AB 1 TP1 96 (SUTURE) ×6 IMPLANT
SUT SILK 2 0 SH CR/8 (SUTURE) ×3 IMPLANT
SUT SILK 2 0 TIES 10X30 (SUTURE) ×3 IMPLANT
SUT SILK 3 0 SH CR/8 (SUTURE) ×3 IMPLANT
SUT SILK 3 0 TIES 10X30 (SUTURE) ×3 IMPLANT
TOWEL OR 17X24 6PK STRL BLUE (TOWEL DISPOSABLE) ×3 IMPLANT
TOWEL OR 17X26 10 PK STRL BLUE (TOWEL DISPOSABLE) ×3 IMPLANT
TRAY FOLEY MTR SLVR 16FR STAT (SET/KITS/TRAYS/PACK) ×3 IMPLANT
YANKAUER SUCT BULB TIP NO VENT (SUCTIONS) IMPLANT

## 2017-09-14 NOTE — Anesthesia Preprocedure Evaluation (Signed)
Anesthesia Evaluation  Patient identified by MRN, date of birth, ID band Patient unresponsive    Reviewed: Allergy & Precautions, NPO status , Patient's Chart, lab work & pertinent test results, Unable to perform ROS - Chart review only  Airway Mallampati: Intubated       Dental   Pulmonary Current Smoker,    Pulmonary exam normal        Cardiovascular hypertension, Pt. on medications + Past MI  Normal cardiovascular exam     Neuro/Psych Depression CVA    GI/Hepatic   Endo/Other    Renal/GU Renal InsufficiencyRenal disease     Musculoskeletal   Abdominal   Peds  Hematology   Anesthesia Other Findings   Reproductive/Obstetrics                             Anesthesia Physical Anesthesia Plan  ASA: III  Anesthesia Plan: General   Post-op Pain Management:    Induction: Intravenous  PONV Risk Score and Plan: Ondansetron and Treatment may vary due to age or medical condition  Airway Management Planned: Oral ETT  Additional Equipment:   Intra-op Plan:   Post-operative Plan: Post-operative intubation/ventilation  Informed Consent: I have reviewed the patients History and Physical, chart, labs and discussed the procedure including the risks, benefits and alternatives for the proposed anesthesia with the patient or authorized representative who has indicated his/her understanding and acceptance.     Plan Discussed with: CRNA and Surgeon  Anesthesia Plan Comments:         Anesthesia Quick Evaluation

## 2017-09-14 NOTE — Transfer of Care (Signed)
Immediate Anesthesia Transfer of Care Note  Patient: Luke Kaufman  Procedure(s) Performed: EXPLORATORY LAPAROTOMY (N/A Abdomen) APPLICATION OF WOUND VAC (N/A Abdomen)  Patient Location: SICU  Anesthesia Type:General  Level of Consciousness: Patient remains intubated per anesthesia plan  Airway & Oxygen Therapy: Patient remains intubated per anesthesia plan and Patient placed on Ventilator (see vital sign flow sheet for setting)  Post-op Assessment: Report given to RN and Post -op Vital signs reviewed and stable  Post vital signs: Reviewed and stable  Last Vitals:  Vitals Value Taken Time  BP 104/57 09/14/2017 11:44 AM  Temp    Pulse 61 09/14/2017 11:45 AM  Resp 20 09/14/2017 11:45 AM  SpO2    Vitals shown include unvalidated device data.  Last Pain:  Vitals:   09/14/17 0743  TempSrc: Oral  PainSc:          Complications: No apparent anesthesia complications

## 2017-09-14 NOTE — Progress Notes (Signed)
Pt taken off vent to transport to OR by CRNA using an ambu bag.

## 2017-09-14 NOTE — Progress Notes (Signed)
2 Days Post-Op   Subjective/Chief Complaint: Trach in place Vent/ sedated on Fentanyl, Precedex Apparently, yesterday, attempts were made to wean the patient although his abdomen remains open.  Patient was agitated.  Back on sedation now.   Objective: Vital signs in last 24 hours: Temp:  [97.8 F (36.6 C)-99.1 F (37.3 C)] 97.8 F (36.6 C) (05/26 0303) Pulse Rate:  [54-117] 63 (05/26 0700) Resp:  [11-26] 20 (05/26 0700) BP: (107-161)/(49-74) 161/74 (05/26 0700) SpO2:  [100 %] 100 % (05/26 0700) FiO2 (%):  [40 %] 40 % (05/26 0303) Weight:  [117.3 kg (258 lb 9.6 oz)] 117.3 kg (258 lb 9.6 oz) (05/26 0500) Last BM Date: 09/11/17  Intake/Output from previous day: 05/25 0701 - 05/26 0700 In: 1359.8 [I.V.:1089.8; NG/GT:270] Out: 1540 [Urine:1990; Emesis/NG output:700; Drains:800; Stool:50] Intake/Output this shift: No intake/output data recorded.  Abd - soft, Ab-thera VAC in place.  The VAC has obviously shifted and is partially extruded.  There is visible small bowel around the right side of the dressing.  No sign of fistula yet.  VAC drainage serosanguinous  Lab Results:  Recent Labs    09/13/17 0408 09/14/17 0359  WBC 10.6* 8.1  HGB 9.2* 9.4*  HCT 28.7* 29.5*  PLT 149* 171   BMET Recent Labs    09/13/17 0408 09/14/17 0359  NA 145 143  K 3.8 3.4*  CL 107 106  CO2 29 27  GLUCOSE 143* 132*  BUN 23* 26*  CREATININE 1.61* 1.53*  CALCIUM 7.6* 7.5*   PT/INR No results for input(s): LABPROT, INR in the last 72 hours. ABG No results for input(s): PHART, HCO3 in the last 72 hours.  Invalid input(s): PCO2, PO2  Studies/Results: Dg Chest Port 1 View  Result Date: 09/13/2017 CLINICAL DATA:  Acute respiratory failure. EXAM: PORTABLE CHEST 1 VIEW COMPARISON:  09/11/2017. FINDINGS: ET tube has been replaced with a tracheostomy. Tip lies 4.5 cm above carina. Central venous catheter tip, appears to lie within the mid RIGHT atrium, potentially advanced from earlier  radiograph. Cardiomediastinal shadow unchanged. Thoracic atherosclerosis. RIGHT greater than LEFT basilar opacities likely atelectasis with small effusions slightly worse. Feeding tube in the stomach. IMPRESSION: Tracheostomy in satisfactory position, with no pneumothorax. Bibasilar opacities, could be slightly worse. Potential advancement of central venous catheter, now with tip in the mid RIGHT atrium. Electronically Signed   By: Staci Righter M.D.   On: 09/13/2017 07:26   Dg Abd Portable 1v  Result Date: 09/13/2017 CLINICAL DATA:  Nasogastric tube placement EXAM: PORTABLE ABDOMEN - 1 VIEW COMPARISON:  Sep 04, 2017 FINDINGS: Nasogastric tube tip and side port in stomach. There is no bowel dilatation or air-fluid level to suggest bowel obstruction. No free air. Visualized lung bases are clear. IMPRESSION: Nasogastric tube tip and side port in stomach. No bowel obstruction or free air evident. Electronically Signed   By: Lowella Grip III M.D.   On: 09/13/2017 18:40   [place] Ekg Site Rite  Result Date: 09/12/2017 If Site Rite image not attached, placement could not be confirmed due to current cardiac rhythm.   Anti-infectives: Anti-infectives (From admission, onward)   Start     Dose/Rate Route Frequency Ordered Stop   09/07/17 1545  cefTRIAXone (ROCEPHIN) 2 g in sodium chloride 0.9 % 100 mL IVPB     2 g 200 mL/hr over 30 Minutes Intravenous Every 24 hours 09/07/17 1543 09/11/17 1605   09/04/17 1400  piperacillin-tazobactam (ZOSYN) IVPB 3.375 g  Status:  Discontinued     3.375  g 12.5 mL/hr over 240 Minutes Intravenous Every 8 hours 09/04/17 1157 09/07/17 1541   09/04/17 1100  ceFAZolin (ANCEF) IVPB 2g/100 mL premix  Status:  Discontinued     2 g 200 mL/hr over 30 Minutes Intravenous Every 8 hours 09/04/17 1035 09/04/17 1157   09/04/17 0630  piperacillin-tazobactam (ZOSYN) IVPB 3.375 g  Status:  Discontinued     3.375 g 100 mL/hr over 30 Minutes Intravenous  Once 09/04/17 0617 09/04/17 0626    09/04/17 0619  piperacillin-tazobactam (ZOSYN) IVPB 3.375 g     3.375 g 100 mL/hr over 30 Minutes Intravenous 60 min pre-op 09/04/17 0620 09/04/17 0717   09/04/17 0030  piperacillin-tazobactam (ZOSYN) IVPB 3.375 g     3.375 g 100 mL/hr over 30 Minutes Intravenous  Once 09/04/17 0024 09/04/17 0144      Assessment/Plan: Mesenteric ischemia - s/p revascularization,, ileostomy, open abd VAC Right hemicolectomy, open abd VAC 09/04/17 - Dr. Florian Buff, VAC change - 09/06/17 - Dr. Delena Serve, VAC change - 09/08/17, 09/10/17, 09/12/17 - Dr. Hulen Skains Tracheostomy - 09/12/17 - Dr. Hulen Skains  Stable No need to allow patient to wake up while he has an open abdomen.  Risk for evisceration, bowel injury, entero-atmospheric fistula VAC change by Dr. Redmond Pulling today  LOS: 10 days    Maia Petties 09/14/2017

## 2017-09-14 NOTE — Progress Notes (Signed)
Weaned Precedex to 0.9 and Fentanyl to 250 as pt's HR to 50 bpm. Pt maintaining RASS goal -2. Will continue to monitor acutely.  Lucius Conn, RN

## 2017-09-14 NOTE — Progress Notes (Addendum)
PULMONARY / CRITICAL CARE MEDICINE   Name: Luke Kaufman MRN: [id number] DOB: [birth year]    ADMISSION DATE:  09/03/2017 CONSULTATION DATE:  5/16  REFERRING MD:  Oneida Alar  CHIEF COMPLAINT:  Post op Vent management  HISTORY OF PRESENT ILLNESS:   67 y/o male presented on 5/15 with abdominal pain, had an ischemic right colon.  Had a R hemicolectomy and eventually an ileostomy.    SUBJECTIVE:   Continued Agitation  on Fentanyl @ 250 and precedex at 0.9( Bradycardia noted above this dose) Sedation goal RASS - 2 No need for  Levo through the night ( previously needed due  to sedation) Trached 5/24 Wound vac >> 400 cc  Urine 580 cc Iliostomy 50 cc OG 700 cc bilious CVP 13 For OR today for wound closure device>> No weaning>> maintain heavy sedation as tolerated  VITAL SIGNS: BP (!) 144/61   Pulse (!) 59   Temp 98.7 F (37.1 C) (Oral)   Resp 20   Ht 5\' 7"  (1.702 m)   Wt 258 lb 9.6 oz (117.3 kg)   SpO2 100%   BMI 40.50 kg/m   HEMODYNAMICS: CVP:  [5 mmHg-13 mmHg] 13 mmHg  VENTILATOR SETTINGS: Vent Mode: PRVC FiO2 (%):  [40 %] 40 % Set Rate:  [20 bmp] 20 bmp Vt Set:  [530 mL] 530 mL PEEP:  [8 cmH20] 8 cmH20 Plateau Pressure:  [14 cmH20-18 cmH20] 17 cmH20  INTAKE / OUTPUT: I/O last 3 completed shifts: In: 1904.8 [I.V.:1384.8; NG/GT:470; IV Piggyback:50] Out: 6378 [Urine:2690; Emesis/NG output:700; Drains:1300; Stool:80]  PHYSICAL EXAMINATION: General: Obese male, sedated, trached and intubated. Hemodynamically stable HEENT: Trach remains stable and secure, Secretions are less bloody, thick neck Neuro: Sedated with RASS goal of 2,opens eyes, but no focus or track, pinpoint pupils, following some  Commands. CV: S1, S2, RRR, No RMG, Tele show rate of 62, NSR PULM: Bilateral excursion, diminished per bases due to large abdomen, coarse breath sounds throughout GI: Soft, NT, large midline open incision with a wound VAC in place, no surrounding erythema or drainage, hypoactive  bowel sounds , serosang drainage. Extremities: 1+ pretibial pitting edema, Pulses confirmed with doppler, no obvious deformities Skin: No skin rash, or lesions noted, warm, dry and intact with exception of surgical abdomen  LABS:  BMET Recent Labs  Lab 09/12/17 0454 09/13/17 0408 09/14/17 0359  NA 143 145 143  K 3.6 3.8 3.4*  CL 104 107 106  CO2 29 29 27   BUN 17 23* 26*  CREATININE 1.69* 1.61* 1.53*  GLUCOSE 117* 143* 132*    Electrolytes Recent Labs  Lab 09/09/17 1320 09/10/17 0500  09/12/17 0454 09/13/17 0408 09/14/17 0359  CALCIUM 7.0* 7.0*   < > 7.4* 7.6* 7.5*  MG 2.4 2.1  --  2.0 2.1 2.3  PHOS 2.4* 2.7  --   --   --  2.6   < > = values in this interval not displayed.    CBC Recent Labs  Lab 09/12/17 0454 09/13/17 0408 09/14/17 0359  WBC 12.9* 10.6* 8.1  HGB 9.2* 9.2* 9.4*  HCT 29.1* 28.7* 29.5*  PLT 146* 149* 171    Coag's No results for input(s): APTT, INR in the last 168 hours.  Sepsis Markers Recent Labs  Lab 09/10/17 1007  LATICACIDVEN 2.11*    ABG Recent Labs  Lab 09/08/17 0210 09/08/17 0842 09/10/17 1014  PHART 7.506* 7.423 7.412  PCO2ART 40.2 39.6 40.3  PO2ART 79.0* 89.0 119.0*    Liver Enzymes Recent Labs  Lab  09/11/17 0320  AST 30  ALT 27  ALKPHOS 67  BILITOT 0.8  ALBUMIN 1.7*    Cardiac Enzymes No results for input(s): TROPONINI, PROBNP in the last 168 hours.  Glucose Recent Labs  Lab 09/13/17 1612 09/13/17 1955 09/13/17 2335 09/14/17 0410 09/14/17 0648 09/14/17 0742  GLUCAP 153* 137* 143* 123* 123* 126*    Imaging Dg Chest Port 1 View  Result Date: 09/14/2017 CLINICAL DATA:  Respiratory failure. EXAM: PORTABLE CHEST 1 VIEW COMPARISON:  09/13/2017 FINDINGS: Tracheostomy tube tip is above the carina. Interval removal of right subclavian catheter. Left arm PICC line tip is in the projection of the cavoatrial junction. Aortic atherosclerotic calcifications noted. Unchanged bilateral pleural effusions with  failed like opacification overlying the lower lobes. IMPRESSION: 1. Removal of right subclavian catheter. 2. No change in bilateral pleural effusions. Electronically Signed   By: Kerby Moors M.D.   On: 09/14/2017 08:19   Dg Abd Portable 1v  Result Date: 09/13/2017 CLINICAL DATA:  Nasogastric tube placement EXAM: PORTABLE ABDOMEN - 1 VIEW COMPARISON:  Sep 04, 2017 FINDINGS: Nasogastric tube tip and side port in stomach. There is no bowel dilatation or air-fluid level to suggest bowel obstruction. No free air. Visualized lung bases are clear. IMPRESSION: Nasogastric tube tip and side port in stomach. No bowel obstruction or free air evident. Electronically Signed   By: Lowella Grip III M.D.   On: 09/13/2017 18:40     STUDIES:  5/15 CT ab/pelvis > portal venous gas in liver, sigmoid colon with diverticular disease, atrophic left kidney  CULTURES: 5/17 resp > e coli resistant to multiple antibiotics 5/17 blood >  negative  ANTIBIOTICS: 5/15 zosyn > 5/19  5/19 ceftriaxone > 5/23   SIGNIFICANT EVENTS: 5/16 mesenteric angiogram and aorto to SMA bypass 5/16 R hemicolectomy 5/18 ileostomy 5/19 abdominal washout and partial fascial closure 5/21 TF started, hypoxia with turning  5/22 Return to OR for exploration, attempted closure > unable 5/24>> Trach  LINES/TUBES: 5/16 R Tibbie CVL > 5/24 5/16 ETT >  5/18 R femoral arterial line > 5/24 5/24 LUA PICC  DISCUSSION: 67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 Fascia remains open, agitation barrier to weaning. Trached 5/24., to return to OR 5/26 to place a wound closure device   ASSESSMENT / PLAN:  PULMONARY A: Acute respiratory failure with hypoxemia Acute pulmonary edema Full Vent  Support >> sedation goal RASS -2 CXR 5/26>>Unchanged bilateral pleural effusions  P:   Continue current PRVC 8 cc/kg. No weaning with open abdomen and OR scheduled today Spontaneous  breathing, lightening sedation have been limited by he is tenuous abdomen, potential for loss of integrity with straining, coughing, etc., and agitation Continue Diuresis tolerated CXR 5/27 No weaning today 5/26 ( OR scheduled/ open abdomen) Trached 5/24 with hope this will facilitate weaning when abdomen closed  CARDIOVASCULAR A:  Shock - suspect sedation related +/-sepsis CVP = 13 Bradycardia with precedex P:  Tele Wean Precedex for bradycardia Off  norepinephrine  X 24 hours  MAP Goal > 65 mm Hg  RENAL A:   Hypokalemia, repleted 5/26 AKI >> creatinine slowly down trending P:   Minimize IV fluids Tolerating diuresis Will repeat Lasix 40 BID on  5/25>> renal function stable Replace electrolytes as indicated Follow BMET Continue to Trend Mag  GASTROINTESTINAL A:    Ischemic colon, s/p hemicolectomy and ileostomy Abdomen remains open, wound VAC in place. Emesis 5/25 with feeds OG placed >>  700 cc bilious out For OR today for wound closure devise and dressing change P:   Discussed with Dr. Hulen Skains 5/23 and 5/24, he will go for retention device that will hopefully slowly allow fascia to be closed. Monitor drainage NPO for now OG to suction per surgery Continue famotidine Appreciate surgical assist  HEMATOLOGIC A:   Anemia  Scant bleeding from around trach>> Improved 5/26 P:  Trend CBC Monitor for any obvious bleeding Transfuse for HGB < 7  INFECTIOUS A:   Ischemic bowel, s/p abdominal washout several times, appeared clear on 5/20 E.coli HCAP CVC d/c 5/25>> New PICC 5/24 T Max 99.1/ WBC 8.1 P:   Ceftriaxone course completed 5/23 Follow clinically off abx>> WBC continues to trend down Trend fever and WBC Culture as is clinically indicated   ENDOCRINE A:   Hyperglycemia P:   Continue SSI per protocol  NEUROLOGIC A:   Sedation needs for vent synchrony P:   RASS Goal: -2 given risk for perturbing abdomen Fentanyl infusion Precedex added 5/26 PRN  Versed available   FAMILY  - Updates: Sister Bethena Roys) updated via phone 5/22 by Dr Lake Bells, on 5/24 by Dr Hulen Skains.  She is his Media planner (not married, adult child who is special needs).  No Family updates 5/26      Magdalen Spatz, AGACNP-BC 09/14/2017, 8:47 AM [facility] Pager [phone]

## 2017-09-14 NOTE — Anesthesia Procedure Notes (Signed)
Performed by: Neldon Newport, CRNA Pre-anesthesia Checklist: Timeout performed, Patient being monitored, Suction available, Emergency Drugs available and Patient identified Preoxygenation: Anesthesia circuit connected to existing tracheostomy.   Placement Confirmation: breath sounds checked- equal and bilateral and positive ETCO2

## 2017-09-14 NOTE — Interval H&P Note (Signed)
History and Physical Interval Note:  09/14/2017 9:57 AM  Luke Kaufman  has presented today for surgery, with the diagnosis of OPEN ABDOMEN  The various methods of treatment have been discussed with the patient and family. After consideration of risks, benefits and other options for treatment, the patient has consented to  Procedure(s): EXPLORATORY LAPAROTOMY (N/A) APPLICATION OF WOUND VAC (N/A) as a surgical intervention .  The patient's history has been reviewed, patient examined, no change in status, stable for surgery.  I have reviewed the patient's chart and labs.  Questions were answered to the patient's satisfaction.    Attempted to reach sister via both numbers in the computer but could not reach Plan re-exploration and placement of AbThera open abdomen wound vac  Leighton Ruff. Redmond Pulling, MD, FACS General, Bariatric, & Minimally Invasive Surgery [facility] [place]   Greer Pickerel

## 2017-09-14 NOTE — Anesthesia Postprocedure Evaluation (Signed)
Anesthesia Post Note  Patient: Joie Reamer  Procedure(s) Performed: EXPLORATORY LAPAROTOMY (N/A Abdomen) APPLICATION OF WOUND VAC (N/A Abdomen)     Patient location during evaluation: SICU Anesthesia Type: General Level of consciousness: patient remains intubated per anesthesia plan Pain management: pain level controlled Vital Signs Assessment: post-procedure vital signs reviewed and stable Respiratory status: patient on ventilator - see flowsheet for VS and respiratory function stable Cardiovascular status: blood pressure returned to baseline and stable Anesthetic complications: no    Last Vitals:  Vitals:   09/14/17 1151 09/14/17 1153  BP: (!) 112/58   Pulse: 62 (!) 59  Resp: 20 20  Temp:  36.6 C  SpO2:      Last Pain:  Vitals:   09/14/17 1153  TempSrc: Oral  PainSc:                  Arie Powell DAVID

## 2017-09-14 NOTE — Op Note (Signed)
09/14/2017  11:50 AM  PATIENT:  Luke Kaufman  67 y.o. male  PRE-OPERATIVE DIAGNOSIS:  OPEN ABDOMEN with AbThera wound vac in place; h/o mesenteric ischemia s/p Right colectomy and mesenteric bypass  POST-OPERATIVE DIAGNOSIS: same  PROCEDURE:  Procedure(s): RE-EXPLORATION OF OPEN ABDOMEN  APPLICATION OF ABTHERA WOUND VAC  SURGEON:  Surgeon(s): Greer Pickerel, MD  ASSISTANTS: none   ANESTHESIA:   general  DRAINS: Nasogastric Tube and Urinary Catheter (Foley)   LOCAL MEDICATIONS USED:  NONE  SPECIMEN:  No Specimen  DISPOSITION OF SPECIMEN:  N/A  COUNTS:  YES  INDICATION FOR PROCEDURE: 67 year old gentleman with an open abdomen in the ICU after undergoing multiple trips to the operating room for initially mesenteric ischemia requiring right colectomy and mesenteric bypass.  He had loss of domain and has been undergoing washout and placement of open abdominal wound VAC system.  It was noticed on rounds this morning that his wound VAC was partially dislodged with some bowel coming up from around the edge of the wound VAC dressing.  Consent had previously been obtained for return trip to the operating room for washout and change of wound VAC.  I was unable to reach the next of kin prior to taking the patient back to the operating room today  PROCEDURE: The patient was taken intubated directly from the ICU to the OR 2 at [facility], placed supine on the operating table.  Anesthesia circuit was connected to his indwelling tracheostomy.  Full general anesthesia was established.  He was on scheduled broad-spectrum IV antibiotics.  The VAC sponge and the ileostomy appliance were removed.  His abdomen was prepped with Betadine and then draped in the usual standard surgical fashion.  A surgical timeout was performed.  The central part of the abdomen was somewhat frozen.  I did not attempt to lift off the omentum from the underlying small bowel.  The small bowel was viable and well  perfused.  I was able to inspect each of the 4 quadrants and irrigated the abdomen.  There is no abscess.  There was some light-tinged yellow fluid in the pelvis.  The abdomen was irrigated with 3 L of saline.  He still had a wide amount of fascial separation.  And AB Thera wound VAC system was obtained.  Skin was prepared.  Vaseline gauze was placed over the denuded areas of his skin.  The after wound VAC system was applied in typical fashion.  Plastic drapes were placed on top of the sponge to secure the [facility] in place.  It was connected to the canister at 125 mm of continuous suction.  We had a good seal.  There is no air leak.  An ostomy appliance was applied to the ileostomy in the right lower quadrant.  All counts, instruments, and sponge counts were correct x2.  There were no immediate complications.  The patient was taken directly back to the ICU for ongoing care.  Tentative plan is for patient return midweek for placement of an abdominal closure assistive device called the Abra device  PLAN OF CARE: RETURN TO icU  PATIENT DISPOSITION:  ICU - intubated and hemodynamically stable.   Delay start of Pharmacological VTE agent (>24hrs) due to surgical blood loss or risk of bleeding:  no  Leighton Ruff. Redmond Pulling, MD, FACS General, Bariatric, & Minimally Invasive Surgery [facility], [place]

## 2017-09-14 NOTE — H&P (View-Only) (Signed)
2 Days Post-Op   Subjective/Chief Complaint: Trach in place Vent/ sedated on Fentanyl, Precedex Apparently, yesterday, attempts were made to wean the patient although his abdomen remains open.  Patient was agitated.  Back on sedation now.   Objective: Vital signs in last 24 hours: Temp:  [97.8 F (36.6 C)-99.1 F (37.3 C)] 97.8 F (36.6 C) (05/26 0303) Pulse Rate:  [54-117] 63 (05/26 0700) Resp:  [11-26] 20 (05/26 0700) BP: (107-161)/(49-74) 161/74 (05/26 0700) SpO2:  [100 %] 100 % (05/26 0700) FiO2 (%):  [40 %] 40 % (05/26 0303) Weight:  [117.3 kg (258 lb 9.6 oz)] 117.3 kg (258 lb 9.6 oz) (05/26 0500) Last BM Date: 09/11/17  Intake/Output from previous day: 05/25 0701 - 05/26 0700 In: 1359.8 [I.V.:1089.8; NG/GT:270] Out: 8841 [Urine:1990; Emesis/NG output:700; Drains:800; Stool:50] Intake/Output this shift: No intake/output data recorded.  Abd - soft, Ab-thera VAC in place.  The VAC has obviously shifted and is partially extruded.  There is visible small bowel around the right side of the dressing.  No sign of fistula yet.  VAC drainage serosanguinous  Lab Results:  Recent Labs    09/13/17 0408 09/14/17 0359  WBC 10.6* 8.1  HGB 9.2* 9.4*  HCT 28.7* 29.5*  PLT 149* 171   BMET Recent Labs    09/13/17 0408 09/14/17 0359  NA 145 143  K 3.8 3.4*  CL 107 106  CO2 29 27  GLUCOSE 143* 132*  BUN 23* 26*  CREATININE 1.61* 1.53*  CALCIUM 7.6* 7.5*   PT/INR No results for input(s): LABPROT, INR in the last 72 hours. ABG No results for input(s): PHART, HCO3 in the last 72 hours.  Invalid input(s): PCO2, PO2  Studies/Results: Dg Chest Port 1 View  Result Date: 09/13/2017 CLINICAL DATA:  Acute respiratory failure. EXAM: PORTABLE CHEST 1 VIEW COMPARISON:  09/11/2017. FINDINGS: ET tube has been replaced with a tracheostomy. Tip lies 4.5 cm above carina. Central venous catheter tip, appears to lie within the mid RIGHT atrium, potentially advanced from earlier  radiograph. Cardiomediastinal shadow unchanged. Thoracic atherosclerosis. RIGHT greater than LEFT basilar opacities likely atelectasis with small effusions slightly worse. Feeding tube in the stomach. IMPRESSION: Tracheostomy in satisfactory position, with no pneumothorax. Bibasilar opacities, could be slightly worse. Potential advancement of central venous catheter, now with tip in the mid RIGHT atrium. Electronically Signed   By: Staci Righter M.D.   On: 09/13/2017 07:26   Dg Abd Portable 1v  Result Date: 09/13/2017 CLINICAL DATA:  Nasogastric tube placement EXAM: PORTABLE ABDOMEN - 1 VIEW COMPARISON:  Sep 04, 2017 FINDINGS: Nasogastric tube tip and side port in stomach. There is no bowel dilatation or air-fluid level to suggest bowel obstruction. No free air. Visualized lung bases are clear. IMPRESSION: Nasogastric tube tip and side port in stomach. No bowel obstruction or free air evident. Electronically Signed   By: Lowella Grip III M.D.   On: 09/13/2017 18:40   [place] Ekg Site Rite  Result Date: 09/12/2017 If Site Rite image not attached, placement could not be confirmed due to current cardiac rhythm.   Anti-infectives: Anti-infectives (From admission, onward)   Start     Dose/Rate Route Frequency Ordered Stop   09/07/17 1545  cefTRIAXone (ROCEPHIN) 2 g in sodium chloride 0.9 % 100 mL IVPB     2 g 200 mL/hr over 30 Minutes Intravenous Every 24 hours 09/07/17 1543 09/11/17 1605   09/04/17 1400  piperacillin-tazobactam (ZOSYN) IVPB 3.375 g  Status:  Discontinued     3.375  g 12.5 mL/hr over 240 Minutes Intravenous Every 8 hours 09/04/17 1157 09/07/17 1541   09/04/17 1100  ceFAZolin (ANCEF) IVPB 2g/100 mL premix  Status:  Discontinued     2 g 200 mL/hr over 30 Minutes Intravenous Every 8 hours 09/04/17 1035 09/04/17 1157   09/04/17 0630  piperacillin-tazobactam (ZOSYN) IVPB 3.375 g  Status:  Discontinued     3.375 g 100 mL/hr over 30 Minutes Intravenous  Once 09/04/17 0617 09/04/17 0626    09/04/17 0619  piperacillin-tazobactam (ZOSYN) IVPB 3.375 g     3.375 g 100 mL/hr over 30 Minutes Intravenous 60 min pre-op 09/04/17 0620 09/04/17 0717   09/04/17 0030  piperacillin-tazobactam (ZOSYN) IVPB 3.375 g     3.375 g 100 mL/hr over 30 Minutes Intravenous  Once 09/04/17 0024 09/04/17 0144      Assessment/Plan: Mesenteric ischemia - s/p revascularization,, ileostomy, open abd VAC Right hemicolectomy, open abd VAC 09/04/17 - Dr. Florian Buff, VAC change - 09/06/17 - Dr. Delena Serve, VAC change - 09/08/17, 09/10/17, 09/12/17 - Dr. Hulen Skains Tracheostomy - 09/12/17 - Dr. Hulen Skains  Stable No need to allow patient to wake up while he has an open abdomen.  Risk for evisceration, bowel injury, entero-atmospheric fistula VAC change by Dr. Redmond Pulling today  LOS: 10 days    Luke Kaufman 09/14/2017

## 2017-09-14 NOTE — Progress Notes (Addendum)
Pt still has OPEN abdomen - no weaning; keep sedated Updated sister via phone  Leighton Ruff. Redmond Pulling, MD, FACS General, Bariatric, & Minimally Invasive Surgery [facility], [place]

## 2017-09-14 NOTE — Progress Notes (Signed)
Pt arrived back from OR to 2H02.  Pt placed back on full vent support. Vitals are stable at this time. RN at bedside.

## 2017-09-15 ENCOUNTER — Encounter (HOSPITAL_COMMUNITY): Payer: Self-pay | Admitting: General Surgery

## 2017-09-15 ENCOUNTER — Inpatient Hospital Stay (HOSPITAL_COMMUNITY): Payer: PPO

## 2017-09-15 LAB — GLUCOSE, CAPILLARY
GLUCOSE-CAPILLARY: 126 mg/dL — AB (ref 65–99)
Glucose-Capillary: 107 mg/dL — ABNORMAL HIGH (ref 65–99)
Glucose-Capillary: 128 mg/dL — ABNORMAL HIGH (ref 65–99)
Glucose-Capillary: 131 mg/dL — ABNORMAL HIGH (ref 65–99)
Glucose-Capillary: 81 mg/dL (ref 65–99)

## 2017-09-15 LAB — BASIC METABOLIC PANEL
Anion gap: 7 (ref 5–15)
BUN: 29 mg/dL — ABNORMAL HIGH (ref 6–20)
CHLORIDE: 112 mmol/L — AB (ref 101–111)
CO2: 28 mmol/L (ref 22–32)
CREATININE: 1.4 mg/dL — AB (ref 0.61–1.24)
Calcium: 7.5 mg/dL — ABNORMAL LOW (ref 8.9–10.3)
GFR calc non Af Amer: 50 mL/min — ABNORMAL LOW (ref >60)
GFR, EST AFRICAN AMERICAN: 59 mL/min — AB (ref >60)
GLUCOSE: 142 mg/dL — AB (ref 65–99)
Potassium: 3.8 mmol/L (ref 3.5–5.1)
Sodium: 147 mmol/L — ABNORMAL HIGH (ref 135–145)

## 2017-09-15 LAB — CBC
HEMATOCRIT: 29.4 % — AB (ref 39.0–52.0)
HEMOGLOBIN: 9.2 g/dL — AB (ref 13.0–17.0)
MCH: 29.5 pg (ref 26.0–34.0)
MCHC: 31.3 g/dL (ref 30.0–36.0)
MCV: 94.2 fL (ref 78.0–100.0)
Platelets: 174 10*3/uL (ref 150–400)
RBC: 3.12 MIL/uL — ABNORMAL LOW (ref 4.22–5.81)
RDW: 14.2 % (ref 11.5–15.5)
WBC: 6.8 10*3/uL (ref 4.0–10.5)

## 2017-09-15 LAB — TRIGLYCERIDES: Triglycerides: 152 mg/dL — ABNORMAL HIGH (ref <150)

## 2017-09-15 MED ORDER — MIDAZOLAM HCL 2 MG/2ML IJ SOLN
2.0000 mg | INTRAMUSCULAR | Status: DC | PRN
  Administered 2017-09-15 – 2017-09-26 (×10): 2 mg via INTRAVENOUS
  Filled 2017-09-15 (×5): qty 2

## 2017-09-15 MED ORDER — PROPOFOL 1000 MG/100ML IV EMUL
5.0000 ug/kg/min | INTRAVENOUS | Status: DC
  Administered 2017-09-15: 5 ug/kg/min via INTRAVENOUS
  Filled 2017-09-15 (×2): qty 100

## 2017-09-15 MED ORDER — IPRATROPIUM-ALBUTEROL 0.5-2.5 (3) MG/3ML IN SOLN: 3.0000 mL | Freq: Four times a day (QID) | RESPIRATORY_TRACT | Status: DC | PRN

## 2017-09-15 MED ORDER — POTASSIUM CHLORIDE 20 MEQ/15ML (10%) PO SOLN
40.0000 meq | Freq: Once | ORAL | Status: AC
  Administered 2017-09-15: 40 meq via ORAL
  Filled 2017-09-15: qty 30

## 2017-09-15 MED ORDER — SODIUM CHLORIDE 0.9 % IV SOLN
4.0000 mg/h | INTRAVENOUS | Status: DC
  Administered 2017-09-15: 4 mg/h via INTRAVENOUS
  Administered 2017-09-15: 2 mg/h via INTRAVENOUS
  Administered 2017-09-16 – 2017-09-23 (×15): 4 mg/h via INTRAVENOUS
  Filled 2017-09-15 (×17): qty 10

## 2017-09-15 NOTE — Progress Notes (Signed)
Spoke face to face with Mannam, MD about patients intermittent agitation maxed out on 400 mg Fentanyl and 2mg  hr Versed gtt. MD verbal order to go up to 3 mg/hr Versed gtt. Will administer and monitor closely.  Lucius Conn, RN

## 2017-09-15 NOTE — Progress Notes (Signed)
Progress Note: General Surgery Service   Assessment/Plan: Patient Active Problem List   Diagnosis Date Noted  . Abdominal pain 09/04/2017  . PUD (peptic ulcer disease) 09/04/2017  . Mesenteric ischemia ([place]) 09/04/2017  . 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 Left arm numbness 12/14/2015  . Numbness 12/14/2015  . Bilateral carotid artery stenosis 11/22/2015  . Recurrent major depressive disorder in partial remission ([place]) 10/19/2015  . Mitral valve prolapse 09/07/2015  . Vitamin D deficiency 08/31/2015  . Arterial atherosclerosis 04/10/2015  . Cataract 04/10/2015  . Cerebrovascular accident, old 04/10/2015  . Gonalgia 04/10/2015  . HLD (hyperlipidemia) 04/10/2015  . 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 Renal artery stenosis ([place]) 04/10/2015  . History of non-ST elevation myocardial infarction (NSTEMI) 04/10/2015  . Anemia associated with acute blood loss 08/24/2014  . Thrombocytopenia ([place])   . Hypokalemia   . Essential hypertension 08/09/2014  . Chest pain 08/09/2014  . Near syncope 08/09/2014  . CKD (chronic kidney disease) stage 3, GFR 30-59 ml/min (HCC) 08/09/2014  . Tobacco abuse 08/09/2014  . Upper GI bleeding   . Gastric ulcer with hemorrhage   . Acute blood loss anemia   . CVA (cerebral infarction) 05/16/2013  . HTN (hypertension) 05/16/2013  . 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   s/p Procedure(s): EXPLORATORY LAPAROTOMY APPLICATION OF WOUND VAC 09/14/2017  Maintain sedation and ventilation Continue wound vac therapy Plan to return to OR in next 48h   LOS: 11 days  Chief Complaint/Subjective: OR yesterday, no events overnight  Objective: Vital signs in last 24 hours: Temp:  [97.6 F (36.4 C)-98 F (36.7 C)] 97.7 F (36.5 C) (05/27 0733) Pulse Rate:  [50-77] 68 (05/27 0751) Resp:  [18-24] 20 (05/27 0751) BP: (107-164)/(50-109) 146/68 (05/27 0751) SpO2:  [96 %-100 %] 100 % (05/27 0751) FiO2 (%):  [40 %] 40 % (05/27  0751) Weight:  [117.9 kg (259 lb 14.8 oz)] 117.9 kg (259 lb 14.8 oz) (05/27 0500) Last BM Date: 09/11/17  Intake/Output from previous day: 05/26 0701 - 05/27 0700 In: 2440.9 [I.V.:1870.9; NG/GT:370; IV Piggyback:200] Out: 2116 [Urine:1615; Drains:500; Blood:1] Intake/Output this shift: No intake/output data recorded.  Lungs: assisted  Cardiovascular: RRR  Abd: soft, blue sponge in place and function, right side ostomy with small amount of fluid in bag  Extremities: trace edema  Neuro: intubated, sedated  Lab Results: CBC  Recent Labs    09/14/17 0359 09/15/17 0332  WBC 8.1 6.8  HGB 9.4* 9.2*  HCT 29.5* 29.4*  PLT 171 174   BMET Recent Labs    09/14/17 1601 09/15/17 0332  NA 145 147*  K 3.9 3.8  CL 109 112*  CO2 26 28  GLUCOSE 144* 142*  BUN 25* 29*  CREATININE 1.40* 1.40*  CALCIUM 7.4* 7.5*   PT/INR No results for input(s): LABPROT, INR in the last 72 hours. ABG No results for input(s): PHART, HCO3 in the last 72 hours.  Invalid input(s): PCO2, PO2  Studies/Results:  Anti-infectives: Anti-infectives (From admission, onward)   Start     Dose/Rate Route Frequency Ordered Stop   09/07/17 1545  cefTRIAXone (ROCEPHIN) 2 g in sodium chloride 0.9 % 100 mL IVPB     2 g 200 mL/hr over 30 Minutes Intravenous Every 24 hours 09/07/17 1543 09/11/17 1605   09/04/17 1400  piperacillin-tazobactam (ZOSYN) IVPB 3.375 g  Status:  Discontinued     3.375 g 12.5 mL/hr over 240 Minutes Intravenous Every 8 hours 09/04/17 1157 09/07/17 1541   09/04/17 1100  ceFAZolin (ANCEF) IVPB 2g/100 mL premix  Status:  Discontinued     2 g 200 mL/hr over 30 Minutes Intravenous Every 8 hours 09/04/17 1035 09/04/17 1157   09/04/17 0630  piperacillin-tazobactam (ZOSYN) IVPB 3.375 g  Status:  Discontinued     3.375 g 100 mL/hr over 30 Minutes Intravenous  Once 09/04/17 0617 09/04/17 0626   09/04/17 0619  piperacillin-tazobactam (ZOSYN) IVPB 3.375 g     3.375 g 100 mL/hr over 30 Minutes  Intravenous 60 min pre-op 09/04/17 0620 09/04/17 0717   09/04/17 0030  piperacillin-tazobactam (ZOSYN) IVPB 3.375 g     3.375 g 100 mL/hr over 30 Minutes Intravenous  Once 09/04/17 0024 09/04/17 0144      Medications: Scheduled Meds: . chlorhexidine gluconate (MEDLINE KIT)  15 mL Mouth Rinse BID  . docusate  100 mg Per Tube Daily  . feeding supplement (PRO-STAT SUGAR FREE 64)  30 mL Per Tube TID  . feeding supplement (VITAL HIGH PROTEIN)  1,000 mL Per Tube Q24H  . insulin aspart  0-15 Units Subcutaneous Q4H  . ipratropium-albuterol  3 mL Nebulization Q6H  . mouth rinse  15 mL Mouth Rinse 10 times per day  . sodium chloride flush  10-40 mL Intracatheter Q12H   Continuous Infusions: . sodium chloride    . dexmedetomidine (PRECEDEX) IV infusion 1.2 mcg/kg/hr (09/15/17 0700)  . famotidine (PEPCID) IV Stopped (09/14/17 2215)  . fentaNYL infusion INTRAVENOUS 400 mcg/hr (09/15/17 0700)  . magnesium sulfate 1 - 4 g bolus IVPB    . norepinephrine (LEVOPHED) Adult infusion Stopped (09/13/17 0445)  . propofol (DIPRIVAN) infusion     PRN Meds:.Place/Maintain arterial line **AND** sodium chloride, acetaminophen **OR** acetaminophen, alum & mag hydroxide-simeth, bisacodyl, fentaNYL (SUBLIMAZE) injection, hydrALAZINE, magnesium sulfate 1 - 4 g bolus IVPB, midazolam, ondansetron **OR** ondansetron (ZOFRAN) IV, phenol, sodium chloride flush  Mickeal Skinner, MD Pg# [phone] [facility]

## 2017-09-15 NOTE — Consult Note (Addendum)
Dent Nurse ostomy follow up Surgical team following for assessment and plan of care for abd wound.  Pt has Abthera Vac in place with good seal and progress notes indicate it will be changed in the OR later this week.  Please refer to surgical team for further questions regarding plan of care.  Stoma type/location:  Ileostomy stoma to RLQ is 100% yellow slough, 1 1/4 inches, flush with skin level.  Some slough removed from the center when gently wiped, revealing small amt pink to center of the stoma. Peristomal assessment: Scattered areas of medical adhesive related skin damage from Vac drape and tape surrounding outer edges of pouching suytem, red moist partial thickness wounds.  Output: Scant amt brown liquid stool Ostomy pouching: 1pc.convex pouch with barrier ring applied, but smaller 2 piece system and barrier ring will be better suited to use in this site to avoid disturbing Vac drape in between changes.  Supplies ordered to the bedside for staff nurse use. Education provided: No family present and pt is critically ill. Enrolled patient in [place] Start Discharge program: No WOC team will continue to follow and begin teaching sessions when stable and out of ICU. Julien Girt MSN, RN, [place], [place], [place]

## 2017-09-15 NOTE — Progress Notes (Signed)
PULMONARY / CRITICAL CARE MEDICINE   Name: Mavryk Pino MRN: [id number] DOB: 1950-11-06    ADMISSION DATE:  09/03/2017 CONSULTATION DATE:  5/16  REFERRING MD:  Oneida Alar  CHIEF COMPLAINT:  Post Op Vent management  HISTORY OF PRESENT ILLNESS:   67 y/o male presented on 5/15 with abdominal pain, had an ischemic right colon.  Had a R hemicolectomy and eventually an ileostomy.    SUBJECTIVE:   Continued Agitation  on Fentanyl and propofol at 0.9( Bradycardia noted above this dose) Sedation goal RASS - 2 Remains off levo Trached 5/24 Wound vac >> 600cc  Iliostomy >> 25 cc Tube Feeds at 20 cc  CVP 10 Wound remains open, for OR later this weel>> No weaning>> maintain heavy sedation as tolerated  VITAL SIGNS: BP (!) 118/55   Pulse 62   Temp 97.7 F (36.5 C) (Oral)   Resp 20   Ht 5\' 7"  (1.702 m)   Wt 259 lb 14.8 oz (117.9 kg)   SpO2 100%   BMI 40.71 kg/m   HEMODYNAMICS: CVP:  [10 mmHg-13 mmHg] 10 mmHg  VENTILATOR SETTINGS: Vent Mode: PRVC FiO2 (%):  [40 %] 40 % Set Rate:  [20 bmp] 20 bmp Vt Set:  [530 mL] 530 mL PEEP:  [8 cmH20] 8 cmH20 Plateau Pressure:  [17 cmH20-21 cmH20] 18 cmH20  INTAKE / OUTPUT: I/O last 3 completed shifts: In: 3347.2 [I.V.:2537.2; NG/GT:610; IV Piggyback:200] Out: 3846 [Urine:2195; Emesis/NG output:700; Drains:900; Stool:38; Blood:1]  PHYSICAL EXAMINATION: General: Obese male, sedated, trached and intubated. Hemodynamically stable post OR 5/26 HEENT: Trach stable and secure, Thick neck, No LAD Neuro: Sedated with RASS goal of 2,opens eyes, but no focus or track, pinpoint pupils, following some  Commands. CV: S1, S2, RRR, No RMG, Tele show rate of 72, NSR PULM: Bilateral excursion, diminished per bases due to large abdomen, coarse , with rhonchi throughout GI: Soft, , NT, large midline open incision with a wound VAC in place, no surrounding erythema or drainage, hypoactive bowel sounds , serosang drainage. Extremities: 1-2 + pretibial pitting  edema, Pulses confirmed with doppler, no obvious deformities, sluggish  refill Skin: No skin rash, or lesions noted, warm, dry and intact with exception of surgical abdomen/ wound vac  LABS:  BMET Recent Labs  Lab 09/14/17 0359 09/14/17 1601 09/15/17 0332  NA 143 145 147*  K 3.4* 3.9 3.8  CL 106 109 112*  CO2 27 26 28   BUN 26* 25* 29*  CREATININE 1.53* 1.40* 1.40*  GLUCOSE 132* 144* 142*    Electrolytes Recent Labs  Lab 09/09/17 1320 09/10/17 0500  09/12/17 0454 09/13/17 0408 09/14/17 0359 09/14/17 1601 09/15/17 0332  CALCIUM 7.0* 7.0*   < > 7.4* 7.6* 7.5* 7.4* 7.5*  MG 2.4 2.1  --  2.0 2.1 2.3  --   --   PHOS 2.4* 2.7  --   --   --  2.6  --   --    < > = values in this interval not displayed.    CBC Recent Labs  Lab 09/13/17 0408 09/14/17 0359 09/15/17 0332  WBC 10.6* 8.1 6.8  HGB 9.2* 9.4* 9.2*  HCT 28.7* 29.5* 29.4*  PLT 149* 171 174    Coag's No results for input(s): APTT, INR in the last 168 hours.  Sepsis Markers Recent Labs  Lab 09/10/17 1007  LATICACIDVEN 2.11*    ABG Recent Labs  Lab 09/10/17 1014  PHART 7.412  PCO2ART 40.3  PO2ART 119.0*    Liver Enzymes Recent Labs  Lab 09/11/17 0320  AST 30  ALT 27  ALKPHOS 67  BILITOT 0.8  ALBUMIN 1.7*    Cardiac Enzymes No results for input(s): TROPONINI, PROBNP in the last 168 hours.  Glucose Recent Labs  Lab 09/14/17 1152 09/14/17 1705 09/14/17 1928 09/14/17 2310 09/15/17 0340 09/15/17 0724  GLUCAP 115* 144* 147* 142* 131* 126*    Imaging Dg Chest Port 1 View  Result Date: 09/15/2017 CLINICAL DATA:  Respiratory failure. EXAM: PORTABLE CHEST 1 VIEW COMPARISON:  09/14/2017 and older exams. FINDINGS: There has been mild improvement in right perihilar and bilateral lung base opacity since the previous day's study. Residual opacity is most likely atelectasis. Suspect small effusions. No new lung abnormalities. No evidence of pulmonary edema. Tracheostomy tube, left PICC and  nasal/orogastric tube are stable and well positioned. IMPRESSION: 1. Improved lung aeration when compared to the prior exam. Persistent lung base and right perihilar opacities are likely due to atelectasis associated with small effusions. No evidence of pulmonary edema. Electronically Signed   By: Lajean Manes M.D.   On: 09/15/2017 07:42     STUDIES:  5/15 CT ab/pelvis > portal venous gas in liver, sigmoid colon with diverticular disease, atrophic left kidney  CULTURES: 5/17 resp > e coli resistant to multiple antibiotics 5/17 blood >  negative  ANTIBIOTICS: 5/15 zosyn > 5/19  5/19 ceftriaxone > 5/23   SIGNIFICANT EVENTS: 5/16 mesenteric angiogram and aorto to SMA bypass 5/16 R hemicolectomy 5/18 ileostomy 5/19 abdominal washout and partial fascial closure 5/21 TF started, hypoxia with turning  5/22 Return to OR for exploration, attempted closure > unable 5/24>> Trach 5/26>> OR for debridement and dressing change  LINES/TUBES: 5/16 R [place] CVL > 5/24 5/16 ETT >  5/18 R femoral arterial line > 5/24 5/24 LUA PICC  DISCUSSION: 67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 Fascia remains open, agitation barrier to weaning. Trached 5/24., to return to OR 5/26 to place a wound closure device   ASSESSMENT / PLAN:  PULMONARY A: Acute respiratory failure with hypoxemia Acute pulmonary edema Full Vent  Support >> sedation goal RASS -2 CXR 5/27>>Improved lung aeration when compared to the prior exam. Persistent lung base and right perihilar opacities are likely due to atelectasis associated with small effusions. No evidence of pulmonary edema. P:   Continue current PRVC 8 cc/kg. No weaning with open abdomen and repeat OR this week Spontaneous breathing, lightening sedation have been limited by he is tenuous abdomen, potential for loss of integrity with straining, coughing, etc., and agitation Will hold diuresis today, BP  soft with sedation and CXR no edema CXR 5/28 Trached 5/24 with hope this will facilitate weaning when abdomen closed  CARDIOVASCULAR A:  Shock - suspect sedation related +/-sepsis>> resolving CVP = 10 Bradycardia with precedex resolved with change to propofol P:  Tele Continue propofol Check triglycerides Noerpi as needed for BP drops due to sedation  MAP Goal > 65 mm Hg EKG prn Monitor QTc  RENAL A:   Hypokalemia, repleted 5/26 AKI >> creatinine slowly down trending P:   Minimize IV fluids Tolerating diuresis Lasix holiday 5/27 Replace electrolytes as indicated Follow BMET Continue to Trend Mag  GASTROINTESTINAL A:    Ischemic colon, s/p hemicolectomy and ileostomy Abdomen remains open, wound VAC in place. Emesis 5/25 with feeds OG placed >> 700 cc bilious out For OR 5/26 for debridement and  dressing change>> will return to OR this week for closure devise  P:   Discussed with Dr. Hulen Skains 5/23 and 5/24, he will go for retention device that will hopefully slowly allow fascia to be closed. Monitor drainage Tube feeds per surgery, resumed 5/27 at 20 Continue famotidine Appreciate surgical assist  HEMATOLOGIC A:   Anemia  Scant bleeding from around trach>> resolved P:  Trend CBC Monitor for any obvious bleeding Transfuse for HGB < 7  INFECTIOUS A:   Ischemic bowel, s/p abdominal washout several times, appeared clear on 5/20 E.coli HCAP CVC d/c 5/25>> New PICC 5/24 T Max 98.7/ WBC 6.8 P:   Ceftriaxone course completed 5/23 Follow clinically off abx>> WBC continues to trend down Trend fever and WBC Culture as is clinically indicated   ENDOCRINE A:   Hyperglycemia P:   CBG's Continue SSI per protocol  NEUROLOGIC A:   Sedation needs for vent synchrony Needs RASS -2 to protect open abdomen P:   RASS Goal: -2 given risk for perturbing abdomen Fentanyl infusion Propofol  added 5/27 Trend triglycerides PRN Versed available   FAMILY  - Updates:  Sister Bethena Roys) updated via phone 5/22 by Dr Lake Bells, on 5/24 by Dr Hulen Skains.  She is his Media planner (not married, adult child who is special needs).  No Family updates 5/27      Magdalen Spatz, AGACNP-BC 09/15/2017, 10:01 AM [place] Pager [phone]

## 2017-09-15 NOTE — Progress Notes (Signed)
eLink Physician-Brief Progress Note Patient Name: Luke Kaufman DOB: 1950/10/25 MRN: [id number]   Date of Service  09/15/2017  HPI/Events of Note  QTc interval > 500 milliseconds - Patient is now off of both Precedex and Propofol IV infusions.   eICU Interventions  Will D/C orders from medication list to insure that neither gets restarted.      Intervention Category Major Interventions: Other:  Lysle Dingwall 09/15/2017, 11:17 PM

## 2017-09-16 ENCOUNTER — Inpatient Hospital Stay (HOSPITAL_COMMUNITY): Payer: PPO

## 2017-09-16 LAB — COMPREHENSIVE METABOLIC PANEL
ALBUMIN: 1.9 g/dL — AB (ref 3.5–5.0)
ALK PHOS: 49 U/L (ref 38–126)
ALT: 24 U/L (ref 17–63)
ANION GAP: 9 (ref 5–15)
AST: 33 U/L (ref 15–41)
BILIRUBIN TOTAL: 0.5 mg/dL (ref 0.3–1.2)
BUN: 35 mg/dL — AB (ref 6–20)
CALCIUM: 7.4 mg/dL — AB (ref 8.9–10.3)
CO2: 27 mmol/L (ref 22–32)
Chloride: 114 mmol/L — ABNORMAL HIGH (ref 101–111)
Creatinine, Ser: 1.53 mg/dL — ABNORMAL HIGH (ref 0.61–1.24)
GFR calc Af Amer: 53 mL/min — ABNORMAL LOW (ref >60)
GFR calc non Af Amer: 45 mL/min — ABNORMAL LOW (ref >60)
Glucose, Bld: 111 mg/dL — ABNORMAL HIGH (ref 65–99)
Potassium: 3.6 mmol/L (ref 3.5–5.1)
Sodium: 150 mmol/L — ABNORMAL HIGH (ref 135–145)
TOTAL PROTEIN: 5 g/dL — AB (ref 6.5–8.1)

## 2017-09-16 LAB — CBC
HEMATOCRIT: 29.9 % — AB (ref 39.0–52.0)
HEMOGLOBIN: 9.2 g/dL — AB (ref 13.0–17.0)
MCH: 29.4 pg (ref 26.0–34.0)
MCHC: 30.8 g/dL (ref 30.0–36.0)
MCV: 95.5 fL (ref 78.0–100.0)
Platelets: 224 10*3/uL (ref 150–400)
RBC: 3.13 MIL/uL — ABNORMAL LOW (ref 4.22–5.81)
RDW: 14.7 % (ref 11.5–15.5)
WBC: 10.3 10*3/uL (ref 4.0–10.5)

## 2017-09-16 LAB — GLUCOSE, CAPILLARY
GLUCOSE-CAPILLARY: 88 mg/dL (ref 65–99)
GLUCOSE-CAPILLARY: 105 mg/dL — AB (ref 65–99)
Glucose-Capillary: 76 mg/dL (ref 65–99)
Glucose-Capillary: 104 mg/dL — ABNORMAL HIGH (ref 65–99)
Glucose-Capillary: 110 mg/dL — ABNORMAL HIGH (ref 65–99)
Glucose-Capillary: 94 mg/dL (ref 65–99)

## 2017-09-16 LAB — MAGNESIUM: Magnesium: 2.3 mg/dL (ref 1.7–2.4)

## 2017-09-16 LAB — TRIGLYCERIDES: Triglycerides: 210 mg/dL — ABNORMAL HIGH (ref <150)

## 2017-09-16 MED ORDER — FREE WATER
300.0000 mL | Freq: Three times a day (TID) | Status: DC
  Administered 2017-09-16 – 2017-09-18 (×7): 300 mL

## 2017-09-16 MED ORDER — VITAL HIGH PROTEIN PO LIQD
1000.0000 mL | ORAL | Status: DC
  Administered 2017-09-16 – 2017-09-22 (×11): 1000 mL

## 2017-09-16 MED ORDER — SODIUM CHLORIDE 0.9 % IV BOLUS
1000.0000 mL | Freq: Once | INTRAVENOUS | Status: AC
  Administered 2017-09-16: 1000 mL via INTRAVENOUS

## 2017-09-16 NOTE — Progress Notes (Addendum)
PULMONARY / CRITICAL CARE MEDICINE   Name: Luke Kaufman MRN: [id number] DOB: 01-Aug-1950    ADMISSION DATE:  09/03/2017 CONSULTATION DATE:  5/16  REFERRING MD:  Oneida Alar  CHIEF COMPLAINT:  Post Op Vent management  HISTORY OF PRESENT ILLNESS:   67 y/o male presented on 5/15 with abdominal pain, had an ischemic right colon.  Had a R hemicolectomy and eventually an ileostomy.    SUBJECTIVE:   Opens his eyes in response to his name. Follows some command.  Intermittent agitation on 400 mg Fentanyl and 2mg  hr Versed gtt overnight. Versed increased to 3 mg/hr.  VITAL SIGNS: BP (!) 114/49   Pulse (!) 55   Temp 98.9 F (37.2 C) (Oral)   Resp 20   Ht 5\' 7"  (1.702 m)   Wt 256 lb 6.3 oz (116.3 kg)   SpO2 100%   BMI 40.16 kg/m   HEMODYNAMICS: CVP:  [2 mmHg-7 mmHg] 5 mmHg  VENTILATOR SETTINGS: Vent Mode: PRVC FiO2 (%):  [40 %] 40 % Set Rate:  [20 bmp] 20 bmp Vt Set:  [530 mL] 530 mL PEEP:  [8 cmH20] 8 cmH20 Plateau Pressure:  [16 cmH20-18 cmH20] 18 cmH20  INTAKE / OUTPUT: I/O last 3 completed shifts: In: 3205.4 [I.V.:2005.4; NG/GT:900; IV Piggyback:300] Out: 4287 [Urine:1830; Drains:1700; Stool:175]  PHYSICAL EXAMINATION:  General: Obese male, sedated, trached and intubated. HEENT: Trach stable and secure, opens his eyes in response to his name Neuro: Sedated with RASS goal of 2,opens eyes, following some commands. CV: S1, S2, RRR, No RMG, Tele show rate of 71, NSR PULM: trach in place, on PRVC, bilateral air movement, rhonchi GI: Soft, , NT, large midline open incision with a wound VAC in place, no surrounding erythema, serosang drainage about 300 cc  Extremities: 1 + pretibial pitting edema Skin: No  lesions noted, warm, surgical abdomen/ wound vac in place  LABS:  BMET Recent Labs  Lab 09/14/17 1601 09/15/17 0332 09/16/17 0301  NA 145 147* 150*  K 3.9 3.8 3.6  CL 109 112* 114*  CO2 26 28 27   BUN 25* 29* 35*  CREATININE 1.40* 1.40* 1.53*  GLUCOSE 144* 142*  111*    Electrolytes Recent Labs  Lab 09/09/17 1320 09/10/17 0500  09/13/17 0408 09/14/17 0359 09/14/17 1601 09/15/17 0332 09/16/17 0301  CALCIUM 7.0* 7.0*   < > 7.6* 7.5* 7.4* 7.5* 7.4*  MG 2.4 2.1   < > 2.1 2.3  --   --  2.3  PHOS 2.4* 2.7  --   --  2.6  --   --   --    < > = values in this interval not displayed.    CBC Recent Labs  Lab 09/14/17 0359 09/15/17 0332 09/16/17 0301  WBC 8.1 6.8 10.3  HGB 9.4* 9.2* 9.2*  HCT 29.5* 29.4* 29.9*  PLT 171 174 224    Coag's No results for input(s): APTT, INR in the last 168 hours.  Sepsis Markers Recent Labs  Lab 09/10/17 1007  LATICACIDVEN 2.11*    ABG Recent Labs  Lab 09/10/17 1014  PHART 7.412  PCO2ART 40.3  PO2ART 119.0*    Liver Enzymes Recent Labs  Lab 09/11/17 0320 09/16/17 0301  AST 30 33  ALT 27 24  ALKPHOS 67 49  BILITOT 0.8 0.5  ALBUMIN 1.7* 1.9*    Cardiac Enzymes No results for input(s): TROPONINI, PROBNP in the last 168 hours.  Glucose Recent Labs  Lab 09/15/17 0340 09/15/17 0724 09/15/17 1146 09/15/17 1536 09/15/17 2331 09/16/17  0728  GLUCAP 131* 126* 128* 81 107* 104*    Imaging Dg Chest Port 1 View  Result Date: 09/16/2017 CLINICAL DATA:  Patient with respiratory failure EXAM: PORTABLE CHEST 1 VIEW COMPARISON:  Chest radiograph 09/15/2017. FINDINGS: Tracheostomy tube terminates in the mid trachea. Enteric tube courses inferior to the diaphragm. Left upper extremity PICC line tip projects over the superior vena cava. Monitoring leads overlie the patient. Stable cardiac and mediastinal contours. Low lung volumes. Bibasilar heterogeneous opacities. No pleural effusion or pneumothorax. IMPRESSION: Support apparatus as above. Bibasilar atelectasis. Electronically Signed   By: Lovey Newcomer M.D.   On: 09/16/2017 07:36     STUDIES:  5/15 CT ab/pelvis > portal venous gas in liver, sigmoid colon with diverticular disease, atrophic left kidney  CULTURES: 5/17 resp > e coli  resistant to multiple antibiotics 5/17 blood >  negative  ANTIBIOTICS: 5/15 zosyn > 5/19  5/19 ceftriaxone > 5/23   SIGNIFICANT EVENTS: 5/16 mesenteric angiogram and aorto to SMA bypass 5/16 R hemicolectomy 5/18 ileostomy 5/19 abdominal washout and partial fascial closure 5/21 TF started, hypoxia with turning  5/22 Return to OR for exploration, attempted closure > unable 5/24>> Trach 5/26>> OR for debridement and dressing change  LINES/TUBES: 5/16 R [place] CVL > 5/24 5/16 ETT >  5/18 R femoral arterial line > 5/24 5/24 LUA PICC  DISCUSSION: 68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 Fascia remains open, agitation barrier to weaning. Trached 5/24., to return to OR 5/26 to place a wound closure device   ASSESSMENT / PLAN:  PULMONARY A: Acute respiratory failure with hypoxemia Acute pulmonary edema Full Vent  Support >> sedation goal RASS -2 CXR 5/28>>Improved lung aeration and appropriately placed support apparatus P:   Continue current PRVC 8 cc/kg. No weaning with open abdomen and repeat OR this week Spontaneous breathing, lightening sedation have been limited by he is tenuous abdomen, potential for loss of integrity with straining, coughing, etc., and agitation Will hold diuresis today, BP soft with sedation and CXR no edema CXR 5/29 Trached 5/24 with hope this will facilitate weaning when abdomen closed  CARDIOVASCULAR A:  Shock - suspect sedation related +/-sepsis>> resolving CVP = 5 MAP ranges from 49-94 Intermittent Bradycardia Prolonged QTc overnight. Currently normal P:  Tele Off propofol & Precidex Continue Levophed gtt MAP Goal > 65 mm Hg EKG prn Monitor QTc. Avoid QTc prolonging drugs Free water for hypernatremia  RENAL A:   Hypokalemia, repleted 5/26 AKI >> sCr stable. UOP 0.5 ml/kg/hr. Net -1L over the last 24 hours, +11L since admit Hypernatremia, Na 150 Hyperchloremia ?Contraction  alkalosis P:   Add free water at 300 cc q6h Hold diuretics Replace electrolytes as indicated Follow BMET Continue to Trend Mag  GASTROINTESTINAL A:    Ischemic colon, s/p hemicolectomy and ileostomy Abdomen remains open, wound VAC in place. Wound vac 1.3L out For OR 5/26 for debridement and  dressing change>> will return to OR this week for closure devise P:   Discussed with Dr. Hulen Skains 5/23 and 5/24, he will go for retention device that will hopefully slowly allow fascia to be closed. Monitor drainage Tube feeds per surgery Continue famotidine Appreciate surgical assist  HEMATOLOGIC A:   Anemia  Scant bleeding from around trach>> resolved P:  Trend CBC Monitor for any obvious bleeding Transfuse for HGB < 7  INFECTIOUS A:   Ischemic bowel, s/p abdominal washout several times, appeared clear on 5/20 E.coli HCAP CVC d/c  5/25>> New PICC 5/24 T Max 98.7/ WBC 6.8 P:   Ceftriaxone course completed 5/23 Follow clinically off abx>> WBC continues to trend down Trend fever and WBC Culture as is clinically indicated  ENDOCRINE A:   Hyperglycemia P:   CBG's Continue SSI per protocol  NEUROLOGIC A:   Sedation needs for vent synchrony Needs RASS -2 to protect open abdomen P:   RASS Goal: -2 given risk for perturbing abdomen Fentanyl infusion Versed increased to 3 mg overnight due to aggitation  FAMILY  - Updates: Sister Bethena Roys) updated via phone 5/22 by Dr Lake Bells, on 5/24 by Dr Hulen Skains.  She is his Media planner (not married, adult child who is special needs).  No Family member at bedside 5/28  Wendee Beavers PGY-3 Pager [phone] 09/16/17  9:01 AM  Attending Note:  67 year old male with ischemic bowel s/p hemicolectomy with an open abdomen.  Agitation overnight that required versed drip overnight.  Currently on versed and fentanyl drips overnight.  Levophed for BP support.  On exam, patient is agitated when aroused but compliant with vent when sedate, lungs are  clear to auscultation.  I reviewed CXR myself, trach is in good position.  No acute disease noted.  TF started on 5/24 and well tolerated.  Patient is currently off abx.  Will monitor off.  No weaning trials for now.  Patient is to go back to the OR in AM.  The patient is critically ill with multiple organ systems failure and requires high complexity decision making for assessment and support, frequent evaluation and titration of therapies, application of advanced monitoring technologies and extensive interpretation of multiple databases.   Critical Care Time devoted to patient care services described in this note is  31  Minutes. This time reflects time of care of this signee Dr Jennet Maduro. This critical care time does not reflect procedure time, or teaching time or supervisory time of PA/NP/Med student/Med Resident etc but could involve care discussion time.  Rush Farmer, M.D. [facility] Pulmonary/Critical Care Medicine. Pager: [phone]. After hours pager: [phone].

## 2017-09-16 NOTE — Progress Notes (Signed)
2 Days Post-Op    CC: Abdominal pain  Subjective: Patient sedated on the vent.  He does look around.  He was able to squeeze on command.  The open abdominal wound VAC is in place.  Ileostomy has a little bit of output through it.  Tube feeding via OG is at 20 mils per hour.  Objective: Vital signs in last 24 hours: Temp:  [97.7 F (36.5 C)-99.7 F (37.6 C)] 98.8 F (37.1 C) (05/27 2337) Pulse Rate:  [44-102] 78 (05/28 0700) Resp:  [0-24] 17 (05/28 0700) BP: (79-158)/(14-107) 139/55 (05/28 0700) SpO2:  [100 %] 100 % (05/28 0400) FiO2 (%):  [40 %] 40 % (05/28 0400) Weight:  [116.3 kg (256 lb 6.3 oz)] 116.3 kg (256 lb 6.3 oz) (05/28 0500) Last BM Date: 09/15/17 1813 IV 1290 urine 1300 per drain 175 stool/ileostomy Afebrile Complete vent support FiO2 100% BP stable currently with norepinephrine support. He is on fentanyl and Versed sedation NA 150 Creatinine 1.53 Triglycerides 210 WBC 10.3, hemoglobin 9.2/hematocrit 29.9, Platelets 224,000 Chest x-ray 5/28: -tracheostomy terminates the mid trachea to to below the diaphragm, left upper extremity PICC line stable cardiac and mediastinal contours low lung volumes bibasilar heterogeneous opacities, no pleural effusion or pneumothorax.  Intake/Output from previous day: 05/27 0701 - 05/28 0700 In: 1732.4 [I.V.:1122.4; NG/GT:510; IV Piggyback:100] Out: 2765 [Urine:1290; Drains:1300; Stool:175] Intake/Output this shift: No intake/output data recorded.  General appearance: Patient is sedated on the vent with mittens in place.  He is looking around, and will follow some voice commands.  Tracheostomy in place OG in place and being used for tube feedings. Resp: Full vent support, tracheostomy, clear anteriorly to auscultation -chest x-ray shows bilateral atelectasis GI: Open wound VAC is in place, no bowel sounds, ileostomy is in place with some stool drainage from the ileostomy Extremities: SCDs in place no significant lower extremity  edema.  Lab Results:  Recent Labs    09/15/17 0332 09/16/17 0301  WBC 6.8 10.3  HGB 9.2* 9.2*  HCT 29.4* 29.9*  PLT 174 224    BMET Recent Labs    09/15/17 0332 09/16/17 0301  NA 147* 150*  K 3.8 3.6  CL 112* 114*  CO2 28 27  GLUCOSE 142* 111*  BUN 29* 35*  CREATININE 1.40* 1.53*  CALCIUM 7.5* 7.4*   PT/INR No results for input(s): LABPROT, INR in the last 72 hours.  Recent Labs  Lab 09/11/17 0320 09/16/17 0301  AST 30 33  ALT 27 24  ALKPHOS 67 49  BILITOT 0.8 0.5  PROT 4.6* 5.0*  ALBUMIN 1.7* 1.9*     Lipase     Component Value Date/Time   LIPASE 37 09/03/2017 1644     Medications: . chlorhexidine gluconate (MEDLINE KIT)  15 mL Mouth Rinse BID  . docusate  100 mg Per Tube Daily  . feeding supplement (PRO-STAT SUGAR FREE 64)  30 mL Per Tube TID  . feeding supplement (VITAL HIGH PROTEIN)  1,000 mL Per Tube Q24H  . insulin aspart  0-15 Units Subcutaneous Q4H  . mouth rinse  15 mL Mouth Rinse 10 times per day  . sodium chloride flush  10-40 mL Intracatheter Q12H   . sodium chloride    . famotidine (PEPCID) IV Stopped (09/15/17 2209)  . fentaNYL infusion INTRAVENOUS 400 mcg/hr (09/16/17 0700)  . magnesium sulfate 1 - 4 g bolus IVPB    . midazolam (VERSED) infusion 4 mg/hr (09/16/17 0700)  . norepinephrine (LEVOPHED) Adult infusion 10 mcg/min (09/16/17 0700)  Anti-infectives (From admission, onward)   Start     Dose/Rate Route Frequency Ordered Stop   09/07/17 1545  cefTRIAXone (ROCEPHIN) 2 g in sodium chloride 0.9 % 100 mL IVPB     2 g 200 mL/hr over 30 Minutes Intravenous Every 24 hours 09/07/17 1543 09/11/17 1605   09/04/17 1400  piperacillin-tazobactam (ZOSYN) IVPB 3.375 g  Status:  Discontinued     3.375 g 12.5 mL/hr over 240 Minutes Intravenous Every 8 hours 09/04/17 1157 09/07/17 1541   09/04/17 1100  ceFAZolin (ANCEF) IVPB 2g/100 mL premix  Status:  Discontinued     2 g 200 mL/hr over 30 Minutes Intravenous Every 8 hours 09/04/17 1035  09/04/17 1157   09/04/17 0630  piperacillin-tazobactam (ZOSYN) IVPB 3.375 g  Status:  Discontinued     3.375 g 100 mL/hr over 30 Minutes Intravenous  Once 09/04/17 0617 09/04/17 0626   09/04/17 0619  piperacillin-tazobactam (ZOSYN) IVPB 3.375 g     3.375 g 100 mL/hr over 30 Minutes Intravenous 60 min pre-op 09/04/17 0620 09/04/17 0717   09/04/17 0030  piperacillin-tazobactam (ZOSYN) IVPB 3.375 g     3.375 g 100 mL/hr over 30 Minutes Intravenous  Once 09/04/17 0024 09/04/17 0144        Assessment/Plan Mesenteric ischemia 1.  Exploratory laparotomy right hemicolectomy 09/04/2017 Dr. Stark Klein 2.  Abdominal exploration, application of temporary abdominal closure device (ABTHERA), 09/04/2017 Dr. Nadeen Landau 3.  Oratory laparotomy, ileostomy, application of open abdominal wound VAC, 09/06/2017 Dr. Georganna Skeans 4.  Open abdomen for attempted closure, 09/08/2017 Dr. Judeth Horn 5.  Oratory laparotomy with possible wound closure(prior sutures upper & lower portion of the fascia had broken), 09/10/2017 Dr. Judeth Horn 6.  Reexploration open abdominal wound, application of ABTHERA WOUND VAC, 09/14/2017 Dr. Greer Pickerel  Acute respiratory failure, with E. Coli HCAP -full vent support Encephalopathy/agitation -fentanyl/propofol Hypertension, CAD, PVD -  Malnutrition Hx Tobacco use 2 packs/day Prior CVA Body mass index is 40    FEN: IV fluids ID: Zosyn 5/16 -09/11/2017; Rocephin 5/19 -09/11/2017 DVT:  SCD Follow up:  TBD  Plan: Ongoing clinical to support CCM.  Tentatively plan taken back to the OR tomorrow, possible placement of ABBRA system.         LOS: 12 days    Luke Kaufman 09/16/2017 [phone]

## 2017-09-16 NOTE — Plan of Care (Signed)
  Problem: Clinical Measurements: Goal: Will remain free from infection Outcome: Progressing Goal: Respiratory complications will improve Outcome: Progressing Goal: Cardiovascular complication will be avoided Outcome: Progressing   Problem: Nutrition: Goal: Adequate nutrition will be maintained Outcome: Progressing   Problem: Elimination: Goal: Will not experience complications related to bowel motility Outcome: Progressing Goal: Will not experience complications related to urinary retention Outcome: Progressing   Problem: Pain Managment: Goal: General experience of comfort will improve Outcome: Progressing   Problem: Safety: Goal: Ability to remain free from injury will improve Outcome: Progressing   Problem: Education: Goal: Knowledge of General Education information will improve Outcome: Not Progressing   Problem: Health Behavior/Discharge Planning: Goal: Ability to manage health-related needs will improve Outcome: Not Progressing   Problem: Clinical Measurements: Goal: Ability to maintain clinical measurements within normal limits will improve Outcome: Not Progressing Goal: Diagnostic test results will improve Outcome: Not Progressing   Problem: Activity: Goal: Risk for activity intolerance will decrease Outcome: Not Progressing   Problem: Coping: Goal: Level of anxiety will decrease Outcome: Not Progressing   Problem: Skin Integrity: Goal: Risk for impaired skin integrity will decrease Outcome: Not Progressing

## 2017-09-16 NOTE — Progress Notes (Signed)
Nutrition Follow-up  DOCUMENTATION CODES:   Obesity unspecified  INTERVENTION:   Vital High Protein @ 20 ml/hr increase to 30 ml/hr today (goal rate remains at 55 ml/hr) 30 ml Prostat TID Free water 300 ml Q8 hours  At goal TF provides: 1620 kcal, 160 grams protein, and 1103 ml free water  NUTRITION DIAGNOSIS:   Increased nutrient needs related to wound healing as evidenced by estimated needs.  Ongoing  GOAL:   Provide needs based on ASPEN/SCCM guidelines  Met  MONITOR:   TF tolerance, Skin  REASON FOR ASSESSMENT:   Consult Enteral/tube feeding initiation and management  ASSESSMENT:   Pt with PMH of HTN, active smoker (2 PPD), CAD, MI, stroke, PAD who was admitted 5/15 with severe epigastric and periumbilical pain found to have ischemic R colon. Pt s/p ex lap with R hemicolectomy 5/16.    5/16- exploratory laparotomy with right hemicolectomy 5/18- oratory laparotomy ileostomy, application of wound VAC 5/20- open abdomen with attempted closure 5/22- oratory laparotomy with possible wound closure 5/24- trach placement 5/26- reexploration open abdominal wound with wound VAC application   Remains on pressor support x1. Plan for pt to go back to OR tomorrow for placement of retention device.  Vital HP has remained at 20 ml/hr since initiation.  Spoke with surgery for possible TF advancement. Per PA okay to increase to 30 ml/hr today. Plan for NPO at midnight. Will advanced TF further after second surgery tomorrow per surgery.   Patient remains intubated on ventilator support MV: 10.8 L/min Temp (24hrs), Avg:98.7 F (37.1 C), Min:97.8 F (36.6 C), Max:99.7 F (37.6 C)  I/O: + 11221 ml since admit UOP: 1290 ml x 24 hours- hold off on diuresing today due to BP Wound VAC: 1300 ml x 24 hours  Medications reviewed and include: fentanyl, versed Labs reviewed: Na 150 (H) TGs 210 (H)  Diet Order:   Diet Order    None      EDUCATION NEEDS:   No education  needs have been identified at this time  Skin:  Skin Assessment: Skin Integrity Issues: Skin Integrity Issues:: Wound VAC Wound Vac: abd  Last BM:  150 ml via ilostomy 5/27  Height:   Ht Readings from Last 1 Encounters:  09/06/17 '5\' 7"'  (1.702 m)    Weight:   Wt Readings from Last 1 Encounters:  09/16/17 256 lb 6.3 oz (116.3 kg)    Ideal Body Weight:  67.2 kg  BMI:  Body mass index is 40.16 kg/m.  Estimated Nutritional Needs:   Kcal:  1400-1700  Protein:  145-168 grams  Fluid:  > 2 L/day  Mariana Single RD, LDN Clinical Nutrition Pager # [phone]

## 2017-09-16 NOTE — Progress Notes (Signed)
[facility]  Subjective  - confused and on vent   Objective 138/63 (!) 59 98.9 F (37.2 C) (Oral) 20 100%  Intake/Output Summary (Last 24 hours) at 09/16/2017 1143 Last data filed at 09/16/2017 1100 Gross per 24 hour  Intake 2308.36 ml  Output 2465 ml  Net -156.64 ml   Abdomen: open VAC in place Extremities feet  Pink warm  Assessment/Planning: 1. VDRF trach vent wean soon after abdomen trips to OR complete 2. Mesenteric ischemia patent bypass clinically with viable bowel 3. Delirium hopefully will clear when OR trips cease 4. Sepsis still on levophed but off antibiotics and normal WBC hopefully improve as vent issues and delirium resolve 5. Hypernatremia sodium continues to rise needs more free water will defer to General surgery/nutrition team  Ruta Hinds 09/16/2017 11:43 AM --  Laboratory Lab Results: Recent Labs    09/15/17 0332 09/16/17 0301  WBC 6.8 10.3  HGB 9.2* 9.2*  HCT 29.4* 29.9*  PLT 174 224   BMET Recent Labs    09/15/17 0332 09/16/17 0301  NA 147* 150*  K 3.8 3.6  CL 112* 114*  CO2 28 27  GLUCOSE 142* 111*  BUN 29* 35*  CREATININE 1.40* 1.53*  CALCIUM 7.5* 7.4*    COAG Lab Results  Component Value Date   INR 1.60 09/04/2017   INR 1.20 08/09/2014   INR 1.02 05/16/2013   No results found for: PTT

## 2017-09-17 ENCOUNTER — Encounter (HOSPITAL_COMMUNITY): Payer: Self-pay | Admitting: Certified Registered"

## 2017-09-17 ENCOUNTER — Inpatient Hospital Stay (HOSPITAL_COMMUNITY): Payer: PPO | Admitting: Certified Registered"

## 2017-09-17 ENCOUNTER — Encounter (HOSPITAL_COMMUNITY)
Admission: EM | Disposition: A | Discharge status: Nursing Home (Includes ICF) | Payer: Self-pay | Source: Home / Self Care | Attending: Pulmonary Disease

## 2017-09-17 HISTORY — PX: LAPAROTOMY: SHX154

## 2017-09-17 LAB — CBC WITH DIFFERENTIAL/PLATELET
ABS IMMATURE GRANULOCYTES: 0.1 10*3/uL (ref 0.0–0.1)
BASOS PCT: 0 %
Basophils Absolute: 0 10*3/uL (ref 0.0–0.1)
EOS ABS: 0.2 10*3/uL (ref 0.0–0.7)
Eosinophils Relative: 3 %
HEMATOCRIT: 29.5 % — AB (ref 39.0–52.0)
Hemoglobin: 9.1 g/dL — ABNORMAL LOW (ref 13.0–17.0)
Immature Granulocytes: 1 %
LYMPHS ABS: 0.9 10*3/uL (ref 0.7–4.0)
Lymphocytes Relative: 11 %
MCH: 29.5 pg (ref 26.0–34.0)
MCHC: 30.8 g/dL (ref 30.0–36.0)
MCV: 95.8 fL (ref 78.0–100.0)
MONOS PCT: 8 %
Monocytes Absolute: 0.6 10*3/uL (ref 0.1–1.0)
NEUTROS ABS: 6.5 10*3/uL (ref 1.7–7.7)
NEUTROS PCT: 77 %
PLATELETS: 200 10*3/uL (ref 150–400)
RBC: 3.08 MIL/uL — AB (ref 4.22–5.81)
RDW: 14.7 % (ref 11.5–15.5)
WBC: 8.3 10*3/uL (ref 4.0–10.5)

## 2017-09-17 LAB — GLUCOSE, CAPILLARY
GLUCOSE-CAPILLARY: 98 mg/dL (ref 65–99)
Glucose-Capillary: 116 mg/dL — ABNORMAL HIGH (ref 65–99)
Glucose-Capillary: 126 mg/dL — ABNORMAL HIGH (ref 65–99)
Glucose-Capillary: 140 mg/dL — ABNORMAL HIGH (ref 65–99)
Glucose-Capillary: 141 mg/dL — ABNORMAL HIGH (ref 65–99)
Glucose-Capillary: 86 mg/dL (ref 65–99)

## 2017-09-17 LAB — TYPE AND SCREEN
ABO/RH(D): O POS
Antibody Screen: NEGATIVE

## 2017-09-17 LAB — COMPREHENSIVE METABOLIC PANEL
ALBUMIN: 1.7 g/dL — AB (ref 3.5–5.0)
ALT: 22 U/L (ref 17–63)
AST: 26 U/L (ref 15–41)
Alkaline Phosphatase: 52 U/L (ref 38–126)
Anion gap: 8 (ref 5–15)
BUN: 30 mg/dL — AB (ref 6–20)
CHLORIDE: 113 mmol/L — AB (ref 101–111)
CO2: 25 mmol/L (ref 22–32)
CREATININE: 1.35 mg/dL — AB (ref 0.61–1.24)
Calcium: 7 mg/dL — ABNORMAL LOW (ref 8.9–10.3)
GFR calc Af Amer: >60 mL/min (ref >60)
GFR, EST NON AFRICAN AMERICAN: 53 mL/min — AB (ref >60)
Glucose, Bld: 162 mg/dL — ABNORMAL HIGH (ref 65–99)
POTASSIUM: 3.3 mmol/L — AB (ref 3.5–5.1)
Sodium: 146 mmol/L — ABNORMAL HIGH (ref 135–145)
Total Bilirubin: 0.5 mg/dL (ref 0.3–1.2)
Total Protein: 4.6 g/dL — ABNORMAL LOW (ref 6.5–8.1)

## 2017-09-17 LAB — MAGNESIUM: MAGNESIUM: 2.3 mg/dL (ref 1.7–2.4)

## 2017-09-17 SURGERY — LAPAROTOMY, EXPLORATORY
Anesthesia: General | Site: Abdomen

## 2017-09-17 MED ORDER — MIDAZOLAM HCL 2 MG/2ML IJ SOLN
INTRAMUSCULAR | Status: DC | PRN
  Administered 2017-09-17: 2 mg via INTRAVENOUS

## 2017-09-17 MED ORDER — CHLORHEXIDINE GLUCONATE 0.12 % MT SOLN
OROMUCOSAL | Status: AC
  Filled 2017-09-17: qty 15

## 2017-09-17 MED ORDER — 0.9 % SODIUM CHLORIDE (POUR BTL) OPTIME
TOPICAL | Status: DC | PRN
  Administered 2017-09-17: 2000 mL

## 2017-09-17 MED ORDER — POTASSIUM CHLORIDE 10 MEQ/50ML IV SOLN
10.0000 meq | INTRAVENOUS | Status: AC
  Administered 2017-09-17 (×4): 10 meq via INTRAVENOUS
  Filled 2017-09-17 (×4): qty 50

## 2017-09-17 MED ORDER — LACTATED RINGERS IV SOLN
INTRAVENOUS | Status: DC | PRN
  Administered 2017-09-17: 15:00:00 via INTRAVENOUS

## 2017-09-17 MED ORDER — SODIUM CHLORIDE 0.9 % IV SOLN
1.0000 g | Freq: Once | INTRAVENOUS | Status: AC
  Administered 2017-09-17: 1 g via INTRAVENOUS
  Filled 2017-09-17: qty 10

## 2017-09-17 MED ORDER — DEXTROSE 5 % IV SOLN
3.0000 g | INTRAVENOUS | Status: AC
  Administered 2017-09-17: 3 g via INTRAVENOUS
  Filled 2017-09-17: qty 3

## 2017-09-17 MED ORDER — ROCURONIUM BROMIDE 10 MG/ML (PF) SYRINGE
PREFILLED_SYRINGE | INTRAVENOUS | Status: DC | PRN
  Administered 2017-09-17: 20 mg via INTRAVENOUS
  Administered 2017-09-17: 30 mg via INTRAVENOUS
  Administered 2017-09-17: 50 mg via INTRAVENOUS

## 2017-09-17 MED ORDER — FENTANYL CITRATE (PF) 100 MCG/2ML IJ SOLN
INTRAMUSCULAR | Status: DC | PRN
  Administered 2017-09-17 (×5): 50 ug via INTRAVENOUS

## 2017-09-17 MED ORDER — FENTANYL CITRATE (PF) 250 MCG/5ML IJ SOLN
INTRAMUSCULAR | Status: AC
  Filled 2017-09-17: qty 5

## 2017-09-17 SURGICAL SUPPLY — 47 items
BLADE CLIPPER SURG (BLADE) IMPLANT
CANISTER SUCT 3000ML PPV (MISCELLANEOUS) ×3 IMPLANT
CANISTER WOUNDNEG PRESSURE 500 (CANNISTER) ×3 IMPLANT
CHLORAPREP W/TINT 26ML (MISCELLANEOUS) IMPLANT
COVER SURGICAL LIGHT HANDLE (MISCELLANEOUS) ×3 IMPLANT
DRAPE INCISE IOBAN 66X45 STRL (DRAPES) ×6 IMPLANT
DRAPE LAPAROSCOPIC ABDOMINAL (DRAPES) ×3 IMPLANT
DRAPE WARM FLUID 44X44 (DRAPE) ×3 IMPLANT
DRSG INTERDRY AG 10X36 (GAUZE/BANDAGES/DRESSINGS) ×3 IMPLANT
DRSG OPSITE POSTOP 4X10 (GAUZE/BANDAGES/DRESSINGS) IMPLANT
DRSG OPSITE POSTOP 4X8 (GAUZE/BANDAGES/DRESSINGS) IMPLANT
DRSG TEGADERM 4X4.75 (GAUZE/BANDAGES/DRESSINGS) ×3 IMPLANT
DRSG VAC ATS LRG SENSATRAC (GAUZE/BANDAGES/DRESSINGS) ×3 IMPLANT
DRSG VAC ATS MED SENSATRAC (GAUZE/BANDAGES/DRESSINGS) ×3 IMPLANT
ELECT BLADE 6.5 EXT (BLADE) IMPLANT
ELECT CAUTERY BLADE 6.4 (BLADE) ×3 IMPLANT
ELECT REM PT RETURN 9FT ADLT (ELECTROSURGICAL) ×3
ELECTRODE REM PT RTRN 9FT ADLT (ELECTROSURGICAL) ×1 IMPLANT
GLOVE BIOGEL PI IND STRL 8 (GLOVE) ×1 IMPLANT
GLOVE BIOGEL PI INDICATOR 8 (GLOVE) ×2
GLOVE ECLIPSE 7.5 STRL STRAW (GLOVE) ×3 IMPLANT
GOWN STRL REUS W/ TWL LRG LVL3 (GOWN DISPOSABLE) ×3 IMPLANT
GOWN STRL REUS W/TWL LRG LVL3 (GOWN DISPOSABLE) ×6
KIT BASIN OR (CUSTOM PROCEDURE TRAY) ×3 IMPLANT
KIT OSTOMY DRAINABLE 2.75 STR (WOUND CARE) ×3 IMPLANT
KIT TURNOVER KIT B (KITS) ×3 IMPLANT
LIGASURE IMPACT 36 18CM CVD LR (INSTRUMENTS) IMPLANT
NS IRRIG 1000ML POUR BTL (IV SOLUTION) ×6 IMPLANT
PACK GENERAL/GYN (CUSTOM PROCEDURE TRAY) ×3 IMPLANT
PAD ARMBOARD 7.5X6 YLW CONV (MISCELLANEOUS) ×3 IMPLANT
SEPRAFILM PROCEDURAL PACK 3X5 (MISCELLANEOUS) IMPLANT
SET ABDOMINAL WALL CLOSURE (MISCELLANEOUS) ×3 IMPLANT
SOLUTION BETADINE 4OZ (MISCELLANEOUS) ×3 IMPLANT
SPECIMEN JAR LARGE (MISCELLANEOUS) IMPLANT
SPONGE LAP 18X18 X RAY DECT (DISPOSABLE) IMPLANT
STAPLER VISISTAT 35W (STAPLE) IMPLANT
SUCTION POOLE TIP (SUCTIONS) ×3 IMPLANT
SUT NOVA 1 T20/GS 25DT (SUTURE) IMPLANT
SUT PDS AB 1 TP1 96 (SUTURE) IMPLANT
SUT SILK 2 0 SH CR/8 (SUTURE) IMPLANT
SUT SILK 2 0 TIES 10X30 (SUTURE) IMPLANT
SUT SILK 3 0 SH CR/8 (SUTURE) IMPLANT
SUT SILK 3 0 TIES 10X30 (SUTURE) IMPLANT
TOWEL OR 17X26 10 PK STRL BLUE (TOWEL DISPOSABLE) IMPLANT
TRAY FOLEY MTR SLVR 16FR STAT (SET/KITS/TRAYS/PACK) IMPLANT
YANKAUER SUCT BULB TIP NO VENT (SUCTIONS) IMPLANT
abra abdominal wall closure set dynamic tissue sys ×3 IMPLANT

## 2017-09-17 NOTE — Transfer of Care (Signed)
Immediate Anesthesia Transfer of Care Note  Patient: Luke Kaufman  Procedure(s) Performed: EXPLORATORY LAPAROTOMY AND PARTIAL CLOSURE OF ABDOMEN (N/A Abdomen)  Patient Location: ICU  Anesthesia Type:General  Level of Consciousness: sedated, unresponsive and Patient remains intubated per anesthesia plan  Airway & Oxygen Therapy: Patient remains intubated per anesthesia plan and Patient placed on Ventilator (see vital sign flow sheet for setting)  Post-op Assessment: Report given to RN and Post -op Vital signs reviewed and stable  Post vital signs: Reviewed and stable  Last Vitals:  Vitals Value Taken Time  BP 126/52 09/17/2017  4:52 PM  Temp    Pulse 122 09/17/2017  4:55 PM  Resp 20 09/17/2017  4:55 PM  SpO2    Vitals shown include unvalidated device data.  Last Pain:  Vitals:   09/17/17 1100  TempSrc: Oral  PainSc:          Complications: No apparent anesthesia complications

## 2017-09-17 NOTE — Progress Notes (Signed)
[facility] Progress Note Patient Name: Jamel Holzmann DOB: 1950-06-23 MRN: [id number]   Date of Service  09/17/2017  HPI/Events of Note  K+ = 3.3, Ca++ = 7.0 and Creatinine = 1.35.  eICU Interventions  Will replace K+ and Ca++.     Intervention Category Major Interventions: Electrolyte abnormality - evaluation and management  Sommer,Steven Eugene 09/17/2017, 6:29 AM

## 2017-09-17 NOTE — Op Note (Signed)
OPERATIVE REPORT  DATE OF OPERATION: 09/17/2017  PATIENT:  Luke Kaufman  67 y.o. male  PRE-OPERATIVE DIAGNOSIS:  open abdomen right hemicolectomy  POST-OPERATIVE DIAGNOSIS:  open abdomen right hemicolectomy  INDICATION(S) FOR OPERATION: Open abdomen with viscera exposed.  FINDINGS: Loss of domain but viable bowel.  PROCEDURE:  Procedure(s): EXPLORATORY LAPAROTOMY AND PARTIAL CLOSURE OF ABDOMEN USING THE ELASTIC TENSION CLOSURE DEVICE (ABRA)  SURGEON:  Surgeon(s): Judeth Horn, MD Coralie Keens, MD  ASSISTANT: Milinda Cave, MD  ANESTHESIA:   general  COMPLICATIONS:  None  EBL: <20 ml  BLOOD ADMINISTERED: none  DRAINS: Nasogastric Tube and Urinary Catheter (Foley)   SPECIMEN:  No Specimen  COUNTS CORRECT:  YES  PROCEDURE DETAILS: The patient was taken to the operating room and placed on table in supine position.  He came directly from the intensive care unit, with a tracheostomy in place and was given inhalation anesthetic as his primary mode of anesthesia.  The external negative pressure wound dressing was removed and then was subsequently prepped with Betadine.  A proper timeout was performed identifying the patient and the procedure to be performed.  His ostomy device was also removed on the right side.  We explored the abdomen and irrigated with saline in all 4 quadrants and found no significant pockets of any type of infection.  We subsequently in preparation for the elastic tension closure device placed a firm piece of silicone barrier in the peritoneal cavity conforming into the ileostomy on the right side.  We subsequently passed the elastic tension strings through the fascia using the device for retrieving the strings protecting the bowel using a hard plastic and rubber protector.  This is done in the upper and lower lobe portion of the abdomen and conformed around the ileostomy also.  Approximately 7 of these strain tension advised to place in the upper abdomen and  to below the ileostomy.  We subsequently used a cylindrical silicone string protector on top of the firm silicone barrier and pads each stream from each side through the slit in the silicone cylinder.  We then used the patented buttons to secure the retention strings on each side of the abdominal incision and secured to the skin with a subsequent device to hold the buttons in place.  Intra-dry clots was passed on each button in order to protect the skin.  Prior to putting on any of this and [place] was placed on the abdominal wall across the incision and cut to the edge of the incision.  Once we did provide draped out the strings to the cylindrical protector and provide tension on each side of the head and the abdominal maneuver in order to release the fascia by pressing on each side surgeon and assistant and then we applied the tension on the devices.  We came down from a initial distance of 27 cm laterally to 20 cm with the initial maneuver.  We subsequently placed a black foam negative pressure wound dressing on top of the surgical wound and the devices and secured in place with the plastic.  This provider still in closure.  We subsequently placed an ostomy device in the right lower quadrant.  All needle counts, sponge counts, and instrument counts were correct.  PATIENT DISPOSITION:  He was taken back to the intensive care unit on the ventilator through his tracheostomy in stable condition.   Judeth Horn 5/29/20194:41 PM

## 2017-09-17 NOTE — Progress Notes (Signed)
[facility]  Subjective  - confused intermittently follows commands   Objective (!) 116/43 (!) 48 98 F (36.7 C) (Oral) 20 100%  Intake/Output Summary (Last 24 hours) at 09/17/2017 0810 Last data filed at 09/17/2017 0800 Gross per 24 hour  Intake 3392.76 ml  Output 2770 ml  Net 622.76 ml   Abdomen soft visible bowel pink Feet warm   Assessment/Planning: POD #13 aortomesenteric bypass Back to OR today to make progress toward closure Hopefully vent wean as abdomen situation more stable Hypernatremia improving Continue levophed wean  Ruta Hinds 09/17/2017 8:10 AM --  Laboratory Lab Results: Recent Labs    09/16/17 0301 09/17/17 0440  WBC 10.3 8.3  HGB 9.2* 9.1*  HCT 29.9* 29.5*  PLT 224 200   BMET Recent Labs    09/16/17 0301 09/17/17 0440  NA 150* 146*  K 3.6 3.3*  CL 114* 113*  CO2 27 25  GLUCOSE 111* 162*  BUN 35* 30*  CREATININE 1.53* 1.35*  CALCIUM 7.4* 7.0*    COAG Lab Results  Component Value Date   INR 1.60 09/04/2017   INR 1.20 08/09/2014   INR 1.02 05/16/2013   No results found for: PTT

## 2017-09-17 NOTE — Progress Notes (Addendum)
Plan for patient to return to OR today. Tube feeding ran overnight at 30 cc/hr; Stopped at 0745 and NG set to suction per Modena Jansky, PA.

## 2017-09-17 NOTE — Progress Notes (Signed)
PULMONARY / CRITICAL CARE MEDICINE   Name: Luke Kaufman MRN: [id number] DOB: 1950-10-01    ADMISSION DATE:  09/03/2017 CONSULTATION DATE:  5/16  REFERRING MD:  Oneida Alar  CHIEF COMPLAINT:  Post Op Vent management  HISTORY OF PRESENT ILLNESS:   67 y/o male presented on 5/15 with abdominal pain, had an ischemic right colon.  Had a R hemicolectomy and eventually an ileostomy.    SUBJECTIVE:   No events overnight, no new complaints, OR visit planned for today, continues to need levophed for BP support  VITAL SIGNS: BP (!) 116/43   Pulse (!) 48   Temp 98 F (36.7 C) (Oral)   Resp 20   Ht 5\' 7"  (1.702 m)   Wt 264 lb 8.8 oz (120 kg)   SpO2 100%   BMI 41.43 kg/m   HEMODYNAMICS: CVP:  [4 mmHg-7 mmHg] 6 mmHg  VENTILATOR SETTINGS: Vent Mode: PRVC FiO2 (%):  [40 %] 40 % Set Rate:  [20 bmp] 20 bmp Vt Set:  [530 mL] 530 mL PEEP:  [8 cmH20] 8 cmH20 Plateau Pressure:  [17 cmH20-20 cmH20] 17 cmH20  INTAKE / OUTPUT: I/O last 3 completed shifts: In: 4148.5 [I.V.:2698.5; Other:120; NG/GT:1180; IV Piggyback:150] Out: 9163 [Urine:1720; Drains:1200; Stool:300]  PHYSICAL EXAMINATION:  General: Obese male, sedate, trached, on full support HEENT: [place]/AT, PERRL, EOM-I and MMM Neuro: Sedate, withdraws all ext to pain CV: RRR, Nl S1/S2 and -M/R/G PULM: Coarse BS diffusely, trach is in good position GI: Soft, tender to palpation, hypoactive BS, open abdomen Extremities: 1+ edema and -tenderness Skin: No  lesions noted, warm, surgical abdomen/ wound vac in place  LABS:  BMET Recent Labs  Lab 09/15/17 0332 09/16/17 0301 09/17/17 0440  NA 147* 150* 146*  K 3.8 3.6 3.3*  CL 112* 114* 113*  CO2 28 27 25   BUN 29* 35* 30*  CREATININE 1.40* 1.53* 1.35*  GLUCOSE 142* 111* 162*    Electrolytes Recent Labs  Lab 09/14/17 0359  09/15/17 0332 09/16/17 0301 09/17/17 0440  CALCIUM 7.5*   < > 7.5* 7.4* 7.0*  MG 2.3  --   --  2.3 2.3  PHOS 2.6  --   --   --   --    < > = values in  this interval not displayed.    CBC Recent Labs  Lab 09/15/17 0332 09/16/17 0301 09/17/17 0440  WBC 6.8 10.3 8.3  HGB 9.2* 9.2* 9.1*  HCT 29.4* 29.9* 29.5*  PLT 174 224 200    Coag's No results for input(s): APTT, INR in the last 168 hours.  Sepsis Markers Recent Labs  Lab 09/10/17 1007  LATICACIDVEN 2.11*    ABG Recent Labs  Lab 09/10/17 1014  PHART 7.412  PCO2ART 40.3  PO2ART 119.0*    Liver Enzymes Recent Labs  Lab 09/11/17 0320 09/16/17 0301 09/17/17 0440  AST 30 33 26  ALT 27 24 22   ALKPHOS 67 49 52  BILITOT 0.8 0.5 0.5  ALBUMIN 1.7* 1.9* 1.7*    Cardiac Enzymes No results for input(s): TROPONINI, PROBNP in the last 168 hours.  Glucose Recent Labs  Lab 09/16/17 1541 09/16/17 1936 09/16/17 2352 09/17/17 0016 09/17/17 0452 09/17/17 0740  GLUCAP 110* 94 126* 86 140* 116*    Imaging No results found.   STUDIES:  5/15 CT ab/pelvis > portal venous gas in liver, sigmoid colon with diverticular disease, atrophic left kidney  CULTURES: 5/17 resp > e coli resistant to multiple antibiotics 5/17 blood >  negative  ANTIBIOTICS:  5/15 zosyn > 5/19  5/19 ceftriaxone > 5/23   SIGNIFICANT EVENTS: 5/16 mesenteric angiogram and aorto to SMA bypass 5/16 R hemicolectomy 5/18 ileostomy 5/19 abdominal washout and partial fascial closure 5/21 TF started, hypoxia with turning  5/22 Return to OR for exploration, attempted closure > unable 5/24>> Trach 5/26>> OR for debridement and dressing change  LINES/TUBES: 5/16 R [place] CVL > 5/24 5/16 ETT >  5/18 R femoral arterial line > 5/24 5/24 LUA PICC  DISCUSSION: 67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 Fascia remains open, agitation barrier to weaning. Trached 5/24., to return to OR 5/26 to place a wound closure device   ASSESSMENT / PLAN:  PULMONARY A: Acute respiratory failure with hypoxemia Acute pulmonary edema Full Vent   Support >> sedation goal RASS -2 CXR 5/28>>Improved lung aeration and appropriately placed support apparatus P:   Continue full vent support given need for OR visit today per surgery Will attempt upon return is closed Full sedation given open abdomen and OR visit today Continue to hold diureses given hemodynamics CXR to PRN or with a change at this point Trached 5/24 with hope this will facilitate weaning when abdomen closed  CARDIOVASCULAR A:  Shock - suspect sedation related +/-sepsis>> resolving CVP = 5 MAP ranges from 49-94 Intermittent Bradycardia Prolonged QTc overnight. Currently normal P:  Tele Off propofol & Precedex Continue Levophed gtt at 7 mcg this AM MAP Goal > 65 mm Hg Monitor QTc. Avoid QTc prolonging drugs Free water for hypernatremia, on hold for OR visit now, will resume upon return  RENAL A:   Hypokalemia, repleted 5/26 AKI >> sCr stable. UOP 0.5 ml/kg/hr. Net -1L over the last 24 hours, +11L since admit Hypernatremia, Na 146 Hyperchloremia ?Contraction alkalosis P:   Continue free water at 300 cc q6h, will hold now for OR visit and resume upon return No further diureses for now given hemodynamics Replace electrolytes as indicated (K and Ca given today) Follow BMET Continue to trend Mag  GASTROINTESTINAL A:    Ischemic colon, s/p hemicolectomy and ileostomy Abdomen remains open, wound VAC in place. Wound vac 1.3L out For OR 5/26 for debridement and  dressing change>> will return to OR this week for closure devise P:   Discussed with surgery, retention device in place, going to the OR for partial closure today Monitor drainage Hold TF for surgery today and resume when surgery is ok Continue famotidine Appreciate surgical assist  HEMATOLOGIC A:   Anemia  Scant bleeding from around trach>> resolved P:  Trend CBC Monitor for signs of bleeding after surgery Transfuse per ICU protocol  INFECTIOUS A:   Ischemic bowel, s/p abdominal washout  several times, appeared clear on 5/20 E.coli HCAP CVC d/c 5/25>> New PICC 5/24 T Max 98.7/ WBC 6.8 P:   Ceftriaxone course completed 5/23 Trend fever and WBC Cultures as clinically indicated Monitor off abx for now  ENDOCRINE A:   Hyperglycemia P:   CBGs ISS NPO for OR visit  NEUROLOGIC A:   Sedation needs for vent synchrony Needs RASS -2 to protect open abdomen P:   RASS Goal: -2 given risk for perturbing abdomen Fentanyl and versed infusion while abdomen is open  FAMILY  - Updates: Sister Bethena Roys) updated via phone 5/22 by Dr Lake Bells, on 5/24 by Dr Hulen Skains.  She is his Media planner (not married, adult child who is special needs).  No family bedside 5/29 to update. No Family member at  bedside 5/28  The patient is critically ill with multiple organ systems failure and requires high complexity decision making for assessment and support, frequent evaluation and titration of therapies, application of advanced monitoring technologies and extensive interpretation of multiple databases.   Critical Care Time devoted to patient care services described in this note is  33  Minutes. This time reflects time of care of this signee Dr Jennet Maduro. This critical care time does not reflect procedure time, or teaching time or supervisory time of PA/NP/Med student/Med Resident etc but could involve care discussion time.  Rush Farmer, M.D. [facility] Pulmonary/Critical Care Medicine. Pager: [phone]. After hours pager: [phone].

## 2017-09-17 NOTE — Anesthesia Preprocedure Evaluation (Signed)
Anesthesia Evaluation  Patient identified by MRN, date of birth, ID band Patient unresponsive    Reviewed: Allergy & Precautions, NPO status , Patient's Chart, lab work & pertinent test results, Unable to perform ROS - Chart review only  History of Anesthesia Complications Negative for: history of anesthetic complications  Airway Mallampati: Intubated       Dental   Pulmonary Current Smoker,    Pulmonary exam normal        Cardiovascular hypertension, Pt. on medications + Past MI and + Peripheral Vascular Disease  Normal cardiovascular exam     Neuro/Psych PSYCHIATRIC DISORDERS Depression CVA    GI/Hepatic Neg liver ROS, PUD, Open abdomen   Endo/Other    Renal/GU Renal InsufficiencyRenal disease     Musculoskeletal   Abdominal   Peds  Hematology  (+) anemia ,   Anesthesia Other Findings   Reproductive/Obstetrics                             Anesthesia Physical Anesthesia Plan  ASA: III  Anesthesia Plan: General   Post-op Pain Management:    Induction: Intravenous  PONV Risk Score and Plan: 1 and Treatment may vary due to age or medical condition  Airway Management Planned: Oral ETT  Additional Equipment: None  Intra-op Plan:   Post-operative Plan: Post-operative intubation/ventilation  Informed Consent:   History available from chart only  Plan Discussed with: CRNA and Surgeon  Anesthesia Plan Comments:         Anesthesia Quick Evaluation

## 2017-09-17 NOTE — Progress Notes (Addendum)
3 Days Post-Op    CC:  Abdominal pain  Subjective: Sedated on the Vent.  K+ being replaced by CCM.  Plan to take back to the OR later today.  Objective: Vital signs in last 24 hours: Temp:  [98.6 F (37 C)-98.9 F (37.2 C)] 98.9 F (37.2 C) (05/29 0043) Pulse Rate:  [44-93] 49 (05/29 0725) Resp:  [0-26] 20 (05/29 0725) BP: (92-150)/(34-102) 108/46 (05/29 0725) SpO2:  [100 %] 100 % (05/29 0725) FiO2 (%):  [40 %] 40 % (05/29 0725) Weight:  [120 kg (264 lb 8.8 oz)] 120 kg (264 lb 8.8 oz) (05/29 0500) Last BM Date: (ileostomy) 2097 IV 890 NG 1345 urine 350 drain 300 ileostomy Afebrile, VSS Na 146, creatinine 1.35, K+ 3.3, Mag 2.3 CBC is stable No films Intake/Output from previous day: 05/28 0701 - 05/29 0700 In: 3189.7 [I.V.:2079.7; NG/GT:890; IV Piggyback:100] Out: 2445 [VQQVZ:5638; Drains:800; Stool:300] Intake/Output this shift: No intake/output data recorded.  General appearance: Sedated on the Vent.   Resp: clear to auscultation bilaterally and anterior exam GI: Vac in place, no BS, ileostomy working.    Lab Results:  Recent Labs    09/16/17 0301 09/17/17 0440  WBC 10.3 8.3  HGB 9.2* 9.1*  HCT 29.9* 29.5*  PLT 224 200    BMET Recent Labs    09/16/17 0301 09/17/17 0440  NA 150* 146*  K 3.6 3.3*  CL 114* 113*  CO2 27 25  GLUCOSE 111* 162*  BUN 35* 30*  CREATININE 1.53* 1.35*  CALCIUM 7.4* 7.0*   PT/INR No results for input(s): LABPROT, INR in the last 72 hours.  Recent Labs  Lab 09/11/17 0320 09/16/17 0301 09/17/17 0440  AST 30 33 26  ALT _0 ALKPHOS 67 49 52  BILITOT 0.8 0.5 0.5  PROT 4.6* 5.0* 4.6*  ALBUMIN 1.7* 1.9* 1.7*     Lipase     Component Value Date/Time   LIPASE 37 09/03/2017 1644     Medications: . chlorhexidine gluconate (MEDLINE KIT)  15 mL Mouth Rinse BID  . docusate  100 mg Per Tube Daily  . feeding supplement (PRO-STAT SUGAR FREE 64)  30 mL Per Tube TID  . feeding supplement (VITAL HIGH PROTEIN)  1,000  mL Per Tube Q24H  . free water  300 mL Per Tube Q8H  . insulin aspart  0-15 Units Subcutaneous Q4H  . mouth rinse  15 mL Mouth Rinse 10 times per day  . sodium chloride flush  10-40 mL Intracatheter Q12H   . famotidine (PEPCID) IV Stopped (09/16/17 2228)  . fentaNYL infusion INTRAVENOUS 400 mcg/hr (09/17/17 0500)  . magnesium sulfate 1 - 4 g bolus IVPB    . midazolam (VERSED) infusion 4 mg/hr (09/17/17 0504)  . norepinephrine (LEVOPHED) Adult infusion 7 mcg/min (09/17/17 0730)  . potassium chloride 10 mEq (09/17/17 0705)   Anti-infectives (From admission, onward)   Start     Dose/Rate Route Frequency Ordered Stop   09/07/17 1545  cefTRIAXone (ROCEPHIN) 2 g in sodium chloride 0.9 % 100 mL IVPB     2 g 200 mL/hr over 30 Minutes Intravenous Every 24 hours 09/07/17 1543 09/11/17 1605   09/04/17 1400  piperacillin-tazobactam (ZOSYN) IVPB 3.375 g  Status:  Discontinued     3.375 g 12.5 mL/hr over 240 Minutes Intravenous Every 8 hours 09/04/17 1157 09/07/17 1541   09/04/17 1100  ceFAZolin (ANCEF) IVPB 2g/100 mL premix  Status:  Discontinued     2 g 200 mL/hr over 30  Minutes Intravenous Every 8 hours 09/04/17 1035 09/04/17 1157   09/04/17 0630  piperacillin-tazobactam (ZOSYN) IVPB 3.375 g  Status:  Discontinued     3.375 g 100 mL/hr over 30 Minutes Intravenous  Once 09/04/17 0617 09/04/17 0626   09/04/17 0619  piperacillin-tazobactam (ZOSYN) IVPB 3.375 g     3.375 g 100 mL/hr over 30 Minutes Intravenous 60 min pre-op 09/04/17 0620 09/04/17 0717   09/04/17 0030  piperacillin-tazobactam (ZOSYN) IVPB 3.375 g     3.375 g 100 mL/hr over 30 Minutes Intravenous  Once 09/04/17 0024 09/04/17 0144      Assessment/Plan Mesenteric ischemia  1 ultrasound right groin, 2 cannulation right common femoral artery, 3 nonselective aortogram, 4 nonselective mesenteric angiogram, 5 aorto to superior mesenteric artery bypass (8 mm Dacron), Dr Ruta Hinds, 09/04/17 1.  Exploratory laparotomy right  hemicolectomy 09/04/2017 Dr. Stark Klein 2.  Abdominal exploration, application of temporary abdominal closure device (ABTHERA), 09/04/2017 Dr. Nadeen Landau 3.  Oratory laparotomy, ileostomy, application of open abdominal wound VAC, 09/06/2017 Dr. Georganna Skeans 4.  Open abdomen for attempted closure, 09/08/2017 Dr. Judeth Horn 5.  Oratory laparotomy with possible wound closure(prior sutures upper & lower portion of the fascia had broken), 09/10/2017 Dr. Judeth Horn 6.  Reexploration open abdominal wound, application of ABTHERAWOUND VAC, 09/14/2017 Dr. Greer Pickerel  Acute respiratory failure, with E. Coli HCAP -full vent support Encephalopathy/agitation -fentanyl/propofol Hypertension, CAD, PVD -  Malnutrition Hx Tobacco use 2 packs/day Prior CVA Body mass index is 40    FEN: IV fluids/Tube feeding at 30 ml hr/Currently feeding is off NG on suction preop ID: Zosyn 5/16 -09/11/2017; Rocephin 5/19 -09/11/2017 DVT:  SCD Follow up:  TBD  Plan:  Exploratory laparotomy with partial closure today Dr. Hulen Skains and Dr. Ninfa Linden.         LOS: 13 days    Kylan Veach 09/17/2017 [phone]

## 2017-09-17 NOTE — Progress Notes (Signed)
[place] Progress Note Patient Name: Luke Kaufman DOB: January 10, 1951 MRN: [id number]   Date of Service  09/17/2017  HPI/Events of Note  No AM labs ordered.   eICU Interventions  Will order: 1. CBC, CMP and Mg++ level for 5 AM.      Intervention Category Major Interventions: Other:  Sommer,Steven [name] 09/17/2017, 3:09 AM

## 2017-09-18 DIAGNOSIS — E876 Hypokalemia: Secondary | ICD-10-CM

## 2017-09-18 DIAGNOSIS — Z930 Tracheostomy status: Secondary | ICD-10-CM

## 2017-09-18 LAB — MAGNESIUM: Magnesium: 2.1 mg/dL (ref 1.7–2.4)

## 2017-09-18 LAB — CBC WITH DIFFERENTIAL/PLATELET
Abs Immature Granulocytes: 0.1 10*3/uL (ref 0.0–0.1)
Basophils Absolute: 0 10*3/uL (ref 0.0–0.1)
Basophils Relative: 0 %
EOS ABS: 0 10*3/uL (ref 0.0–0.7)
EOS PCT: 0 %
HEMATOCRIT: 29.7 % — AB (ref 39.0–52.0)
HEMOGLOBIN: 9.1 g/dL — AB (ref 13.0–17.0)
Immature Granulocytes: 1 %
LYMPHS ABS: 0.6 10*3/uL — AB (ref 0.7–4.0)
LYMPHS PCT: 5 %
MCH: 29.5 pg (ref 26.0–34.0)
MCHC: 30.6 g/dL (ref 30.0–36.0)
MCV: 96.4 fL (ref 78.0–100.0)
MONO ABS: 0.7 10*3/uL (ref 0.1–1.0)
MONOS PCT: 5 %
Neutro Abs: 12.3 10*3/uL — ABNORMAL HIGH (ref 1.7–7.7)
Neutrophils Relative %: 89 %
Platelets: 220 10*3/uL (ref 150–400)
RBC: 3.08 MIL/uL — AB (ref 4.22–5.81)
RDW: 14.7 % (ref 11.5–15.5)
WBC: 13.8 10*3/uL — ABNORMAL HIGH (ref 4.0–10.5)

## 2017-09-18 LAB — GLUCOSE, CAPILLARY
GLUCOSE-CAPILLARY: 92 mg/dL (ref 65–99)
GLUCOSE-CAPILLARY: 138 mg/dL — AB (ref 65–99)
GLUCOSE-CAPILLARY: 122 mg/dL — AB (ref 65–99)
Glucose-Capillary: 139 mg/dL — ABNORMAL HIGH (ref 65–99)
Glucose-Capillary: 129 mg/dL — ABNORMAL HIGH (ref 65–99)

## 2017-09-18 LAB — BASIC METABOLIC PANEL
Anion gap: 5 (ref 5–15)
BUN: 27 mg/dL — AB (ref 6–20)
CO2: 24 mmol/L (ref 22–32)
Calcium: 7.2 mg/dL — ABNORMAL LOW (ref 8.9–10.3)
Chloride: 117 mmol/L — ABNORMAL HIGH (ref 101–111)
Creatinine, Ser: 1.52 mg/dL — ABNORMAL HIGH (ref 0.61–1.24)
GFR calc Af Amer: 53 mL/min — ABNORMAL LOW (ref >60)
GFR calc non Af Amer: 46 mL/min — ABNORMAL LOW (ref >60)
Glucose, Bld: 141 mg/dL — ABNORMAL HIGH (ref 65–99)
POTASSIUM: 4.3 mmol/L (ref 3.5–5.1)
SODIUM: 146 mmol/L — AB (ref 135–145)

## 2017-09-18 LAB — ALBUMIN: Albumin: 1.7 g/dL — ABNORMAL LOW (ref 3.5–5.0)

## 2017-09-18 MED ORDER — SODIUM CHLORIDE 0.45 % IV BOLUS
1000.0000 mL | Freq: Once | INTRAVENOUS | Status: AC
  Administered 2017-09-18: 1000 mL via INTRAVENOUS

## 2017-09-18 MED ORDER — SODIUM CHLORIDE 0.9 % IV SOLN
INTRAVENOUS | Status: DC
  Administered 2017-09-22: 05:00:00 via INTRAVENOUS

## 2017-09-18 MED ORDER — FREE WATER
350.0000 mL | Freq: Four times a day (QID) | Status: DC
  Administered 2017-09-18 – 2017-09-21 (×10): 350 mL

## 2017-09-18 NOTE — Anesthesia Postprocedure Evaluation (Signed)
Anesthesia Post Note  Patient: Luke Kaufman  Procedure(s) Performed: EXPLORATORY LAPAROTOMY AND PARTIAL CLOSURE OF ABDOMEN (N/A Abdomen)     Patient location during evaluation: SICU Anesthesia Type: General Level of consciousness: sedated Pain management: pain level controlled Vital Signs Assessment: post-procedure vital signs reviewed and stable Respiratory status: patient remains intubated per anesthesia plan Cardiovascular status: stable Postop Assessment: no apparent nausea or vomiting Anesthetic complications: no    Last Vitals:  Vitals:   09/18/17 1845 09/18/17 1900  BP: (!) 115/53 (!) 88/53  Pulse: 100 97  Resp: 18 17  Temp:    SpO2: 92% 92%    Last Pain:  Vitals:   09/18/17 1551  TempSrc: Oral  PainSc:                  Priscila Bean

## 2017-09-18 NOTE — Progress Notes (Addendum)
PULMONARY / CRITICAL CARE MEDICINE   Name: Bentlie Catanzaro MRN: [id number] DOB: 1951/01/20    ADMISSION DATE:  09/03/2017 CONSULTATION DATE:  5/16  REFERRING MD:  Oneida Alar  CHIEF COMPLAINT:  Post Op Vent management  Brief history:   67 y/o male presented on 5/15 with abdominal pain, had an ischemic right colon.  Had a R hemicolectomy and eventually an ileostomy.    SUBJECTIVE:   No events overnight. Went to OR yesterday for exploratory laparotomy, placement of ABBRA closure system.    VITAL SIGNS: BP (!) 89/57   Pulse (!) 101   Temp 97.8 F (36.6 C) (Axillary)   Resp 20   Ht 5\' 7"  (1.702 m)   Wt 118.9 kg (262 lb 2 oz)   SpO2 100%   BMI 41.05 kg/m   HEMODYNAMICS: CVP:  [0 mmHg-11 mmHg] 11 mmHg  VENTILATOR SETTINGS: Vent Mode: PRVC FiO2 (%):  [40 %] 40 % Set Rate:  [20 bmp] 20 bmp Vt Set:  [530 mL] 530 mL PEEP:  [8 cmH20] 8 cmH20 Plateau Pressure:  [16 cmH20-23 cmH20] 23 cmH20  INTAKE / OUTPUT: I/O last 3 completed shifts: In: 3542.9 [I.V.:2500.4; Other:10; NG/GT:622.5; IV Piggyback:410] Out: 6734 [Urine:1495; Drains:1800; Stool:150; Blood:10]  PHYSICAL EXAMINATION:  General: Obese male, sedate, trached, on full support HEENT: [place]/AT, PERRL, and MMM Neuro: Sedate, withdraws all ext to pain CV: RRR, Nl S1/S2 and -M/R/G PULM: Coarse BS diffusely, trach is in good position GI: Soft, tender to palpation, hypoactive BS, Wound vac with ABBRA closure system Extremities: 1+ edema and -tenderness Skin: No  lesions noted, warm, surgical abdomen/ wound vac in place  LABS:  BMET Recent Labs  Lab 09/16/17 0301 09/17/17 0440 09/18/17 0455  NA 150* 146* 146*  K 3.6 3.3* 4.3  CL 114* 113* 117*  CO2 27 25 24   BUN 35* 30* 27*  CREATININE 1.53* 1.35* 1.52*  GLUCOSE 111* 162* 141*    Electrolytes Recent Labs  Lab 09/14/17 0359  09/16/17 0301 09/17/17 0440 09/18/17 0455  CALCIUM 7.5*   < > 7.4* 7.0* 7.2*  MG 2.3  --  2.3 2.3 2.1  PHOS 2.6  --   --   --   --     < > = values in this interval not displayed.    CBC Recent Labs  Lab 09/16/17 0301 09/17/17 0440 09/18/17 0455  WBC 10.3 8.3 13.8*  HGB 9.2* 9.1* 9.1*  HCT 29.9* 29.5* 29.7*  PLT 224 200 220    Coag's No results for input(s): APTT, INR in the last 168 hours.  Sepsis Markers No results for input(s): LATICACIDVEN, PROCALCITON, O2SATVEN in the last 168 hours.  ABG No results for input(s): PHART, PCO2ART, PO2ART in the last 168 hours.  Liver Enzymes Recent Labs  Lab 09/16/17 0301 09/17/17 0440  AST 33 26  ALT 24 22  ALKPHOS 49 52  BILITOT 0.5 0.5  ALBUMIN 1.9* 1.7*    Cardiac Enzymes No results for input(s): TROPONINI, PROBNP in the last 168 hours.  Glucose Recent Labs  Lab 09/17/17 0452 09/17/17 0740 09/17/17 1147 09/17/17 2023 09/17/17 2323 09/18/17 0347  GLUCAP 140* 116* 98 92 141* 139*    Imaging No results found.   STUDIES:  5/15 CT ab/pelvis > portal venous gas in liver, sigmoid colon with diverticular disease, atrophic left kidney  CULTURES: 5/17 resp > e coli resistant to multiple antibiotics 5/17 blood >  negative  ANTIBIOTICS: 5/15 zosyn > 5/19  5/19 ceftriaxone > 5/23   SIGNIFICANT  EVENTS: 5/16 mesenteric angiogram and aorto to SMA bypass 5/16 R hemicolectomy 5/18 ileostomy 5/19 abdominal washout and partial fascial closure 5/21 TF started, hypoxia with turning  5/22 Return to OR for exploration, attempted closure > unable 5/24>> Trach 5/26>> OR for debridement and dressing change 5/29>>Exploratory laparotomy, placement of ABBRA closure system  LINES/TUBES: 5/16 R [place] CVL > 5/24 5/16 ETT > 5/24 5/24 Trach 5/18 R femoral arterial line > 5/24 5/24 LUA PICC  DISCUSSION: 67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 Fascia remains open, agitation barrier to weaning. Trached 5/24., to return to OR 5/26 to place a wound closure device   ASSESSMENT /  PLAN:  PULMONARY A: Acute respiratory failure with hypoxemia Acute pulmonary edema Full Vent  Support >> sedation goal RASS -2 CXR 5/28>>Improved lung aeration and appropriately placed support apparatus P:   Continue full vent support Continue to hold diureses given hemodynamics CXR to PRN or with a change at this point Trached 5/24 with hope this will facilitate weaning when abdomen closed  CARDIOVASCULAR A:  Shock - suspect sedation related +/-sepsis>> resolving CVP = 11 MAP in 70's. Off levophed this morning P:  Tele Off propofol & Precedex MAP Goal > 65 mm Hg Monitor QTc. Avoid QTc prolonging drugs  RENAL A:   Hypokalemia; resolved AKI >> sCr stable. Minimal UOP at 0.57ml/kg/hr. Net +5.5L since admit Hypernatremia: stable. Na 146 Hyperchloremia P:   Resume free water No further diureses for now given hemodynamics Replace electrolytes as indicated (K and Ca given today) Follow BMET Continue to trend Mag  GASTROINTESTINAL A:    Ischemic colon, s/p hemicolectomy and ileostomy Abdomen: exploratory laparotomy, placement of ABBRA closure system 5/29 Wound vac 1.3L out P:   Monitor drainage TF resumed Continue famotidine Appreciate surgical assist  HEMATOLOGIC A:   Anemia: stable Scant bleeding from around trach>> resolved Mild leukocytosis likely stress-induced from surgery P:  Trend CBC Monitor for signs of bleeding after surgery Transfuse per ICU protocol  INFECTIOUS A:   Ischemic bowel, s/p abdominal washout several times, appeared clear on 5/20 E.coli HCAP CVC d/c 5/25>> New PICC 5/24 Afebrile Mild leukocytosis likely stress-induced P:   Ceftriaxone course completed 5/23 Trend fever and WBC Cultures as clinically indicated Monitor off abx for now  ENDOCRINE A:   Hyperglycemia P:   CBGs ISS Resume tube feeds  NEUROLOGIC A:   Continue sedation  RASS -2 P:   RASS Goal: -2 Fentanyl and versed infusion.  Will start weaning when  HDS  FAMILY  - Updates: Sister Bethena Roys) updated via phone 5/22 by Dr Lake Bells, on 5/24 by Dr Hulen Skains.  She is his Media planner (not married, adult child who is special needs).  No family bedside 5/29 to update. No Family member at bedside  Wendee Beavers PGY-3 Pager [phone] 09/18/17  8:17 AM  Rush Farmer, M.D. [facility] Pulmonary/Critical Care Medicine. Pager: [phone]. After hours pager: [phone].

## 2017-09-18 NOTE — Plan of Care (Signed)
  Problem: Clinical Measurements: Goal: Respiratory complications will improve Outcome: Progressing   Problem: Nutrition: Goal: Adequate nutrition will be maintained Outcome: Progressing   Problem: Elimination: Goal: Will not experience complications related to bowel motility Outcome: Progressing Goal: Will not experience complications related to urinary retention Outcome: Progressing   Problem: Respiratory: Goal: Patent airway maintenance will improve Outcome: Progressing

## 2017-09-18 NOTE — Plan of Care (Signed)
  Problem: Clinical Measurements: Goal: Ability to maintain clinical measurements within normal limits will improve Outcome: Progressing Goal: Diagnostic test results will improve Outcome: Progressing Goal: Respiratory complications will improve Outcome: Progressing Goal: Cardiovascular complication will be avoided Outcome: Progressing   Problem: Nutrition: Goal: Adequate nutrition will be maintained Outcome: Progressing   Problem: Pain Managment: Goal: General experience of comfort will improve Outcome: Progressing

## 2017-09-18 NOTE — Progress Notes (Signed)
1 Day Post-Op    CC: Abdominal pain  Subjective: Sedated on the ventilator with ongoing pressor support.  Objective: Vital signs in last 24 hours: Temp:  [97.7 F (36.5 C)-99.1 F (37.3 C)] 97.8 F (36.6 C) (05/30 0400) Pulse Rate:  [48-130] 102 (05/30 0709) Resp:  [0-26] 26 (05/30 0709) BP: (83-197)/(41-102) 98/63 (05/30 0709) SpO2:  [97 %-100 %] 100 % (05/30 0709) FiO2 (%):  [40 %] 40 % (05/30 0709) Weight:  [118.9 kg (262 lb 2 oz)] 118.9 kg (262 lb 2 oz) (05/30 0448) Last BM Date: (ileostomy)  2300 IV NG/tube feeding 412 Urine 925 Drain 1000 Stool/ileostomy  50 Afebrile tachycardic last evening, blood pressure was also down some last evening after surgery.  Full ventilator support Pressors are being weaned Sodium 146, creatinine 1.52, WBC slightly elevated 13.8, hemoglobin 9.1, hematocrit 29.7.  Platelets 220,000.  Intake/Output from previous day: 05/29 0701 - 05/30 0700 In: 2791.9 [I.V.:2009.4; NG/GT:412.5; IV Piggyback:360] Out: 1985 [Urine:925; Drains:1000; Stool:50; Blood:10] Intake/Output this shift: No intake/output data recorded.  General appearance: Sedated/unresponsive.  Chest: Clear to auscultation anterior exam. Abdomen: Wound VAC/ABBRA system in place, minimal output through the ileostomy.  Tube feedings have been restarted. Lab Results:  Recent Labs    09/17/17 0440 09/18/17 0455  WBC 8.3 13.8*  HGB 9.1* 9.1*  HCT 29.5* 29.7*  PLT 200 220    BMET Recent Labs    09/17/17 0440 09/18/17 0455  NA 146* 146*  K 3.3* 4.3  CL 113* 117*  CO2 25 24  GLUCOSE 162* 141*  BUN 30* 27*  CREATININE 1.35* 1.52*  CALCIUM 7.0* 7.2*   PT/INR No results for input(s): LABPROT, INR in the last 72 hours.  Recent Labs  Lab 09/16/17 0301 09/17/17 0440  AST 33 26  ALT 24 22  ALKPHOS 49 52  BILITOT 0.5 0.5  PROT 5.0* 4.6*  ALBUMIN 1.9* 1.7*     Lipase     Component Value Date/Time   LIPASE 37 09/03/2017 1644     Medications: . chlorhexidine  gluconate (MEDLINE KIT)  15 mL Mouth Rinse BID  . docusate  100 mg Per Tube Daily  . feeding supplement (PRO-STAT SUGAR FREE 64)  30 mL Per Tube TID  . feeding supplement (VITAL HIGH PROTEIN)  1,000 mL Per Tube Q24H  . free water  300 mL Per Tube Q8H  . insulin aspart  0-15 Units Subcutaneous Q4H  . mouth rinse  15 mL Mouth Rinse 10 times per day  . sodium chloride flush  10-40 mL Intracatheter Q12H    Assessment/Plan 1 ultrasound right groin, 2 cannulation right common femoral artery, 3 nonselective aortogram, 4 nonselective mesenteric angiogram, 5 aorto to superior mesenteric artery bypass (8 mmDacron), Dr Ruta Hinds, 09/04/17 1.Exploratory laparotomy right hemicolectomy 09/04/2017 Dr. Stark Klein 2.Abdominal exploration, application of temporary abdominal closure device(ABTHERA), 09/04/2017 Dr. Nadeen Landau 3.Oratory laparotomy, ileostomy, application of open abdominal wound VAC, 09/06/2017 Dr. Georganna Skeans 4. Open abdomen for attempted closure, 09/08/2017 Dr. Judeth Horn 5. Oratory laparotomy with possible wound closure(prior sutures upper&lower portion of the fascia had broken), 09/10/2017 Dr. Judeth Horn 6. Reexploration open abdominal wound, application ofABTHERAWOUND VAC,09/14/2017 Dr. Greer Pickerel 7.  Exploratory laparotomy, placement of ABBRA closure system, 09/17/17, Dr. Judeth Horn  Acute respiratory failure,with E. ColiHCAP -full vent support Encephalopathy/agitation-fentanyl/propofol Hypertension, CAD, PVD- Malnutrition HxTobacco use 2 packs/day Prior CVA Body mass index is 40    FEN:IV fluids/Tube feeding at 30 ml hr/Currently feeding is off NG on suction preop  UZ:RVUFC 5/16-5/23/2019;Rocephin 5/19-5/23/2019, pre op 09/17/17 DVT: SCD Follow up: TBD  Plan: Continued medical support by critical care medicine.  We will continue to do the fascial release maneuvers at the bedside daily.  Plan to taking back to the OR on Friday for  dressing change.        LOS: 14 days    Tyri Elmore 09/18/2017 [phone]

## 2017-09-18 NOTE — Progress Notes (Addendum)
[facility]  Subjective  - Trach, sedation.   Objective 98/61 (!) 102 97.8 F (36.6 C) (Axillary) 20 100%  Intake/Output Summary (Last 24 hours) at 09/18/2017 0752 Last data filed at 09/18/2017 0600 Gross per 24 hour  Intake 2791.85 ml  Output 1985 ml  Net 806.85 ml    Feet warm well perfused Abdomin with wound vac and closure devise external, abdomin firm  Assessment/Planning: POD # 14 aortomesenteric bypass Patient still requiring Levophed 0.96 mcg/hr Trach with sedation Tube feeding initiated Per General surgery: Abdomen: Wound VAC/ABBRA system in place.      Roxy Horseman 09/18/2017 7:52 AM -- No real change overnight.  No further interventions planned from Vascular standpoint. Will follow from a distance at this point and recheck next week.    Call if questions.  Ruta Hinds, MD [facility] Office: [phone] Pager: [phone]    Laboratory Lab Results: Recent Labs    09/17/17 0440 09/18/17 0455  WBC 8.3 13.8*  HGB 9.1* 9.1*  HCT 29.5* 29.7*  PLT 200 220   BMET Recent Labs    09/17/17 0440 09/18/17 0455  NA 146* 146*  K 3.3* 4.3  CL 113* 117*  CO2 25 24  GLUCOSE 162* 141*  BUN 30* 27*  CREATININE 1.35* 1.52*  CALCIUM 7.0* 7.2*    COAG Lab Results  Component Value Date   INR 1.60 09/04/2017   INR 1.20 08/09/2014   INR 1.02 05/16/2013   No results found for: PTT

## 2017-09-18 NOTE — Progress Notes (Signed)
PULMONARY / CRITICAL CARE MEDICINE   Name: Luke Kaufman MRN: [id number] DOB: 1950/11/23    ADMISSION DATE:  09/03/2017 CONSULTATION DATE:  5/16  REFERRING MD:  Oneida Alar  CHIEF COMPLAINT:  Post Op Vent management  Brief history:   67 y/o male presented on 5/15 with abdominal pain, had an ischemic right colon.  Had a R hemicolectomy and eventually an ileostomy.    SUBJECTIVE:   No events overnight, no new complaints, OR visit yesterday not eventful  VITAL SIGNS: BP (!) 126/58   Pulse (!) 124   Temp 98.1 F (36.7 C) (Oral)   Resp (!) 26   Ht 5\' 7"  (1.702 m)   Wt 262 lb 2 oz (118.9 kg)   SpO2 100%   BMI 41.05 kg/m    HEMODYNAMICS: CVP:  [0 mmHg-11 mmHg] 11 mmHg  VENTILATOR SETTINGS: Vent Mode: PRVC FiO2 (%):  [40 %] 40 % Set Rate:  [20 bmp] 20 bmp Vt Set:  [530 mL] 530 mL PEEP:  [8 cmH20] 8 cmH20 Plateau Pressure:  [16 cmH20-23 cmH20] 23 cmH20  INTAKE / OUTPUT: I/O last 3 completed shifts: In: 3542.9 [I.V.:2500.4; Other:10; NG/GT:622.5; IV Piggyback:410] Out: 5400 [Urine:1495; Drains:1800; Stool:150; Blood:10]  PHYSICAL EXAMINATION:  General: Obese male, NAD, trached HEENT: [place]/AT, PERRL, EOM-I and MMM Neuro: Sedate, not following commands CV: RRR, Nl S1/S2 and -M/R/G PULM: Coarse BS diffusely GI: Soft, NT, ND and +BS Extremities: +1 edema and -tenderness Skin: -edema and -tenderness  LABS:  BMET Recent Labs  Lab 09/16/17 0301 09/17/17 0440 09/18/17 0455  NA 150* 146* 146*  K 3.6 3.3* 4.3  CL 114* 113* 117*  CO2 27 25 24   BUN 35* 30* 27*  CREATININE 1.53* 1.35* 1.52*  GLUCOSE 111* 162* 141*   Electrolytes Recent Labs  Lab 09/14/17 0359  09/16/17 0301 09/17/17 0440 09/18/17 0455  CALCIUM 7.5*   < > 7.4* 7.0* 7.2*  MG 2.3  --  2.3 2.3 2.1  PHOS 2.6  --   --   --   --    < > = values in this interval not displayed.   CBC Recent Labs  Lab 09/16/17 0301 09/17/17 0440 09/18/17 0455  WBC 10.3 8.3 13.8*  HGB 9.2* 9.1* 9.1*  HCT 29.9*  29.5* 29.7*  PLT 224 200 220   Coag's No results for input(s): APTT, INR in the last 168 hours.  Sepsis Markers No results for input(s): LATICACIDVEN, PROCALCITON, O2SATVEN in the last 168 hours.  ABG No results for input(s): PHART, PCO2ART, PO2ART in the last 168 hours.  Liver Enzymes Recent Labs  Lab 09/16/17 0301 09/17/17 0440  AST 33 26  ALT 24 22  ALKPHOS 49 52  BILITOT 0.5 0.5  ALBUMIN 1.9* 1.7*   Cardiac Enzymes No results for input(s): TROPONINI, PROBNP in the last 168 hours.  Glucose Recent Labs  Lab 09/17/17 0740 09/17/17 1147 09/17/17 2023 09/17/17 2323 09/18/17 0347 09/18/17 0848  GLUCAP 116* 98 92 141* 139* 138*   Imaging   STUDIES:  5/15 CT ab/pelvis > portal venous gas in liver, sigmoid colon with diverticular disease, atrophic left kidney  CULTURES: 5/17 resp > e coli resistant to multiple antibiotics 5/17 blood >  negative  ANTIBIOTICS: 5/15 zosyn > 5/19  5/19 ceftriaxone > 5/23   SIGNIFICANT EVENTS: 5/16 mesenteric angiogram and aorto to SMA bypass 5/16 R hemicolectomy 5/18 ileostomy 5/19 abdominal washout and partial fascial closure 5/21 TF started, hypoxia with turning  5/22 Return to OR for exploration, attempted closure >  unable 5/24>> Trach 5/26>> OR for debridement and dressing change 5/29>>Exploratory laparotomy, placement of ABBRA closure system  LINES/TUBES: 5/16 R [place] CVL > 5/24 5/16 ETT > 5/24 5/24 Trach 5/18 R femoral arterial line > 5/24 5/24 LUA PICC  DISCUSSION: 67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 Fascia remains open, agitation barrier to weaning. Trached 5/24., to return to OR 5/26 to place a wound closure device   ASSESSMENT / PLAN:  PULMONARY A: Acute respiratory failure with hypoxemia Acute pulmonary edema Full Vent  Support >> sedation goal RASS -2 CXR 5/28>>Improved lung aeration and appropriately placed support apparatus P:    Continue full vent support while going frequently to the OR Continue to hold diureses CXR in AM Trached 5/24 with hope this will facilitate weaning when abdomen closed  CARDIOVASCULAR A:  Shock - suspect sedation related +/-sepsis>> resolving CVP = 11 MAP in 70's. Off levophed this morning P:  Continue tele monioring Hold off propofol and precedex MAP Goal > 65 mm Hg Monitor QTc. Avoid QTc prolonging drugs  RENAL A:   Hypokalemia; resolved AKI >> sCr stable. Minimal UOP at 0.5ml/kg/hr. Net +5.5L since admit Hypernatremia: stable. Na 146 Hyperchloremia P:   Increase free water to 350 q6 No further diureses Replace electrolytes as indicated Follow BMET  GASTROINTESTINAL A:    Ischemic colon, s/p hemicolectomy and ileostomy Abdomen: exploratory laparotomy, placement of ABBRA closure system 5/29 Wound vac 1.3L out P:   Monitor drainage Continue pepcid Resume TF Appreciate surgical assist  HEMATOLOGIC A:   Anemia: stable Scant bleeding from around trach>> resolved Mild leukocytosis likely stress-induced from surgery P:  Trend CBC Monitor for signs of bleeding after surgery Transfuse per ICU protocol  INFECTIOUS A:   Ischemic bowel, s/p abdominal washout several times, appeared clear on 5/20 E.coli HCAP CVC d/c 5/25>> New PICC 5/24 Afebrile Mild leukocytosis likely stress-induced P:   Ceftriaxone course completed 5/23 Trend fever and WBC Cultures as clinically indicated Monitor off abx for now  ENDOCRINE A:   Hyperglycemia P:   CBGs ISS Resume tube feeds  NEUROLOGIC A:   Continue sedation  RASS -2 P:   RASS Goal: -2 Fentanyl and versed infusion.  Will start weaning when HDS  FAMILY  - Updates: Sister Bethena Roys) updated via phone 5/22 by Dr Lake Bells, on 5/24 by Dr Hulen Skains.  She is his Media planner (not married, adult child who is special needs).  No family bedside 5/30 to update.  The patient is critically ill with multiple organ systems  failure and requires high complexity decision making for assessment and support, frequent evaluation and titration of therapies, application of advanced monitoring technologies and extensive interpretation of multiple databases.   Critical Care Time devoted to patient care services described in this note is  32  Minutes. This time reflects time of care of this signee Dr Jennet Maduro. This critical care time does not reflect procedure time, or teaching time or supervisory time of PA/NP/Med student/Med Resident etc but could involve care discussion time.  Rush Farmer, M.D. [facility] Pulmonary/Critical Care Medicine. Pager: [phone]. After hours pager: [phone].

## 2017-09-19 ENCOUNTER — Inpatient Hospital Stay (HOSPITAL_COMMUNITY): Payer: PPO | Admitting: Certified Registered Nurse Anesthetist

## 2017-09-19 ENCOUNTER — Encounter (HOSPITAL_COMMUNITY): Payer: Self-pay | Admitting: *Deleted

## 2017-09-19 ENCOUNTER — Other Ambulatory Visit: Payer: Self-pay

## 2017-09-19 ENCOUNTER — Inpatient Hospital Stay (HOSPITAL_COMMUNITY): Payer: PPO

## 2017-09-19 ENCOUNTER — Encounter (HOSPITAL_COMMUNITY)
Admission: EM | Disposition: A | Discharge status: Nursing Home (Includes ICF) | Payer: Self-pay | Source: Home / Self Care | Attending: Pulmonary Disease

## 2017-09-19 HISTORY — PX: LAPAROTOMY: SHX154

## 2017-09-19 LAB — GLUCOSE, CAPILLARY
GLUCOSE-CAPILLARY: 107 mg/dL — AB (ref 65–99)
GLUCOSE-CAPILLARY: 90 mg/dL (ref 65–99)
Glucose-Capillary: 122 mg/dL — ABNORMAL HIGH (ref 65–99)
Glucose-Capillary: 102 mg/dL — ABNORMAL HIGH (ref 65–99)
Glucose-Capillary: 118 mg/dL — ABNORMAL HIGH (ref 65–99)
Glucose-Capillary: 107 mg/dL — ABNORMAL HIGH (ref 65–99)

## 2017-09-19 LAB — COMPREHENSIVE METABOLIC PANEL
ALK PHOS: 48 U/L (ref 38–126)
ALT: 13 U/L — ABNORMAL LOW (ref 17–63)
ANION GAP: 4 — AB (ref 5–15)
AST: 22 U/L (ref 15–41)
Albumin: 1.5 g/dL — ABNORMAL LOW (ref 3.5–5.0)
BILIRUBIN TOTAL: 0.5 mg/dL (ref 0.3–1.2)
BUN: 29 mg/dL — ABNORMAL HIGH (ref 6–20)
CALCIUM: 7 mg/dL — AB (ref 8.9–10.3)
CO2: 23 mmol/L (ref 22–32)
Chloride: 117 mmol/L — ABNORMAL HIGH (ref 101–111)
Creatinine, Ser: 1.58 mg/dL — ABNORMAL HIGH (ref 0.61–1.24)
GFR, EST AFRICAN AMERICAN: 51 mL/min — AB (ref >60)
GFR, EST NON AFRICAN AMERICAN: 44 mL/min — AB (ref >60)
Glucose, Bld: 122 mg/dL — ABNORMAL HIGH (ref 65–99)
Potassium: 3.9 mmol/L (ref 3.5–5.1)
SODIUM: 144 mmol/L (ref 135–145)
TOTAL PROTEIN: 4.3 g/dL — AB (ref 6.5–8.1)

## 2017-09-19 LAB — CBC WITH DIFFERENTIAL/PLATELET
ABS IMMATURE GRANULOCYTES: 0.1 10*3/uL (ref 0.0–0.1)
BASOS ABS: 0 10*3/uL (ref 0.0–0.1)
BASOS PCT: 0 %
Eosinophils Absolute: 0.3 10*3/uL (ref 0.0–0.7)
Eosinophils Relative: 2 %
HCT: 25.9 % — ABNORMAL LOW (ref 39.0–52.0)
Hemoglobin: 7.9 g/dL — ABNORMAL LOW (ref 13.0–17.0)
IMMATURE GRANULOCYTES: 1 %
Lymphocytes Relative: 12 %
Lymphs Abs: 1.3 10*3/uL (ref 0.7–4.0)
MCH: 29.2 pg (ref 26.0–34.0)
MCHC: 30.5 g/dL (ref 30.0–36.0)
MCV: 95.6 fL (ref 78.0–100.0)
Monocytes Absolute: 0.9 10*3/uL (ref 0.1–1.0)
Monocytes Relative: 8 %
NEUTROS ABS: 8.2 10*3/uL — AB (ref 1.7–7.7)
NEUTROS PCT: 77 %
PLATELETS: 224 10*3/uL (ref 150–400)
RBC: 2.71 MIL/uL — AB (ref 4.22–5.81)
RDW: 14.9 % (ref 11.5–15.5)
WBC: 10.7 10*3/uL — AB (ref 4.0–10.5)

## 2017-09-19 LAB — MAGNESIUM: MAGNESIUM: 2.1 mg/dL (ref 1.7–2.4)

## 2017-09-19 LAB — PHOSPHORUS: Phosphorus: 2.9 mg/dL (ref 2.5–4.6)

## 2017-09-19 SURGERY — LAPAROTOMY, EXPLORATORY
Anesthesia: General | Site: Abdomen

## 2017-09-19 MED ORDER — CEFAZOLIN SODIUM-DEXTROSE 2-4 GM/100ML-% IV SOLN
2.0000 g | INTRAVENOUS | Status: AC
  Administered 2017-09-19: 2 g via INTRAVENOUS
  Filled 2017-09-19: qty 100

## 2017-09-19 MED ORDER — CEFAZOLIN SODIUM 1 G IJ SOLR
INTRAMUSCULAR | Status: AC
  Filled 2017-09-19: qty 20

## 2017-09-19 MED ORDER — SUGAMMADEX SODIUM 200 MG/2ML IV SOLN
INTRAVENOUS | Status: DC | PRN
  Administered 2017-09-19: 200 mg via INTRAVENOUS

## 2017-09-19 MED ORDER — MIDAZOLAM HCL 2 MG/2ML IJ SOLN
INTRAMUSCULAR | Status: DC | PRN
  Administered 2017-09-19 (×2): 2 mg via INTRAVENOUS

## 2017-09-19 MED ORDER — FENTANYL CITRATE (PF) 250 MCG/5ML IJ SOLN
INTRAMUSCULAR | Status: DC | PRN
  Administered 2017-09-19 (×2): 100 ug via INTRAVENOUS
  Administered 2017-09-19: 50 ug via INTRAVENOUS

## 2017-09-19 MED ORDER — SUGAMMADEX SODIUM 200 MG/2ML IV SOLN
INTRAVENOUS | Status: AC
  Filled 2017-09-19: qty 2

## 2017-09-19 MED ORDER — LIDOCAINE 2% (20 MG/ML) 5 ML SYRINGE
INTRAMUSCULAR | Status: AC
  Filled 2017-09-19: qty 5

## 2017-09-19 MED ORDER — FENTANYL CITRATE (PF) 250 MCG/5ML IJ SOLN
INTRAMUSCULAR | Status: AC
  Filled 2017-09-19: qty 5

## 2017-09-19 MED ORDER — PROPOFOL 10 MG/ML IV BOLUS
INTRAVENOUS | Status: AC
  Filled 2017-09-19: qty 20

## 2017-09-19 MED ORDER — PNEUMOCOCCAL VAC POLYVALENT 25 MCG/0.5ML IJ INJ: 0.5000 mL | INJECTION | INTRAMUSCULAR | Status: DC | PRN

## 2017-09-19 MED ORDER — 0.9 % SODIUM CHLORIDE (POUR BTL) OPTIME
TOPICAL | Status: DC | PRN
  Administered 2017-09-19: 1000 mL

## 2017-09-19 MED ORDER — ROCURONIUM BROMIDE 10 MG/ML (PF) SYRINGE
PREFILLED_SYRINGE | INTRAVENOUS | Status: AC
  Filled 2017-09-19: qty 5

## 2017-09-19 MED ORDER — ROCURONIUM BROMIDE 100 MG/10ML IV SOLN
INTRAVENOUS | Status: DC | PRN
  Administered 2017-09-19: 50 mg via INTRAVENOUS

## 2017-09-19 MED ORDER — MIDAZOLAM HCL 2 MG/2ML IJ SOLN
INTRAMUSCULAR | Status: AC
  Filled 2017-09-19: qty 2

## 2017-09-19 SURGICAL SUPPLY — 37 items
CANISTER SUCT 3000ML PPV (MISCELLANEOUS) ×3 IMPLANT
CANISTER WOUNDNEG PRESSURE 500 (CANNISTER) ×3 IMPLANT
COVER SURGICAL LIGHT HANDLE (MISCELLANEOUS) ×3 IMPLANT
DRAPE INCISE IOBAN 66X45 STRL (DRAPES) ×3 IMPLANT
DRAPE LAPAROSCOPIC ABDOMINAL (DRAPES) ×3 IMPLANT
DRSG INTERDRY AG 10X36 (GAUZE/BANDAGES/DRESSINGS) ×3 IMPLANT
DRSG VAC ATS LRG SENSATRAC (GAUZE/BANDAGES/DRESSINGS) ×3 IMPLANT
DRSG VAC ATS MED SENSATRAC (GAUZE/BANDAGES/DRESSINGS) ×3 IMPLANT
ELECT CAUTERY BLADE 6.4 (BLADE) ×3 IMPLANT
ELECT REM PT RETURN 9FT ADLT (ELECTROSURGICAL) ×3
ELECTRODE REM PT RTRN 9FT ADLT (ELECTROSURGICAL) ×1 IMPLANT
GLOVE BIOGEL PI IND STRL 8 (GLOVE) ×1 IMPLANT
GLOVE BIOGEL PI INDICATOR 8 (GLOVE) ×2
GLOVE SURG SIGNA 7.5 PF LTX (GLOVE) ×3 IMPLANT
GOWN STRL REUS W/ TWL LRG LVL3 (GOWN DISPOSABLE) ×1 IMPLANT
GOWN STRL REUS W/ TWL XL LVL3 (GOWN DISPOSABLE) ×1 IMPLANT
GOWN STRL REUS W/TWL LRG LVL3 (GOWN DISPOSABLE) ×2
GOWN STRL REUS W/TWL XL LVL3 (GOWN DISPOSABLE) ×2
KIT BASIN OR (CUSTOM PROCEDURE TRAY) ×3 IMPLANT
KIT OSTOMY DRAINABLE 2.75 STR (WOUND CARE) ×3 IMPLANT
KIT TURNOVER KIT B (KITS) ×3 IMPLANT
MARKER SKIN DUAL TIP RULER LAB (MISCELLANEOUS) ×3 IMPLANT
NS IRRIG 1000ML POUR BTL (IV SOLUTION) ×6 IMPLANT
PACK GENERAL/GYN (CUSTOM PROCEDURE TRAY) ×3 IMPLANT
PAD ARMBOARD 7.5X6 YLW CONV (MISCELLANEOUS) ×3 IMPLANT
SPONGE LAP 18X18 X RAY DECT (DISPOSABLE) IMPLANT
SUCTION POOLE TIP (SUCTIONS) ×3 IMPLANT
SUT PDS AB 1 TP1 96 (SUTURE) ×6 IMPLANT
SUT SILK 2 0 SH CR/8 (SUTURE) ×3 IMPLANT
SUT SILK 2 0 TIES 10X30 (SUTURE) ×3 IMPLANT
SUT SILK 3 0 SH CR/8 (SUTURE) ×3 IMPLANT
SUT SILK 3 0 TIES 10X30 (SUTURE) ×3 IMPLANT
SUT VIC AB 3-0 SH 18 (SUTURE) IMPLANT
TOWEL OR 17X24 6PK STRL BLUE (TOWEL DISPOSABLE) ×3 IMPLANT
TOWEL OR 17X26 10 PK STRL BLUE (TOWEL DISPOSABLE) ×3 IMPLANT
TRAY FOLEY MTR SLVR 16FR STAT (SET/KITS/TRAYS/PACK) IMPLANT
YANKAUER SUCT BULB TIP NO VENT (SUCTIONS) IMPLANT

## 2017-09-19 NOTE — Transfer of Care (Signed)
Immediate Anesthesia Transfer of Care Note  Patient: Luke Kaufman  Procedure(s) Performed: EXPLORATORY LAPAROTOMY & DRESSING CHANGE (N/A Abdomen)  Patient Location: ICU  Anesthesia Type:General  Level of Consciousness: sedated  Airway & Oxygen Therapy: Patient placed on Ventilator (see vital sign flow sheet for setting)  Post-op Assessment: Report given to RN and Post -op Vital signs reviewed and stable  Post vital signs: Reviewed and stable  Last Vitals:  Vitals Value Taken Time  BP 99/52 09/19/2017 11:52 AM  Temp    Pulse 84 09/19/2017 11:56 AM  Resp 25 09/19/2017 11:56 AM  SpO2 97 % 09/19/2017 11:56 AM  Vitals shown include unvalidated device data.  Last Pain:  Vitals:   09/19/17 0746  TempSrc: Oral  PainSc:          Complications: No apparent anesthesia complications

## 2017-09-19 NOTE — OR Nursing (Signed)
OBTURATOR TAPED TO HEAD OF BED

## 2017-09-19 NOTE — Progress Notes (Addendum)
PULMONARY / CRITICAL CARE MEDICINE   Name: Luke Kaufman MRN: [id number] DOB: 01/15/1951    ADMISSION DATE:  09/03/2017 CONSULTATION DATE:  5/16  REFERRING MD:  Oneida Alar  CHIEF COMPLAINT:  Post Op Vent management  Brief history:   67 y/o male presented on 5/15 with abdominal pain fond to have ischemic right colon in setting of SMA occlusion requiring emergent bypass and eventually R hemicolectomy and ilestomy. Fascia remains open, agitation barrier to weaning. Trached 5/24., to return to OR 5/26 to place a wound closure device   SUBJECTIVE:   No events overnight. Plans to return to OR for dressing change   VITAL SIGNS: BP (!) 112/51   Pulse 72   Temp 98.7 F (37.1 C) (Oral)   Resp 20   Ht 5\' 7"  (1.702 m)   Wt 121.1 kg (266 lb 15.6 oz)   SpO2 100%   BMI 41.81 kg/m   HEMODYNAMICS: CVP:  [8 mmHg-14 mmHg] 14 mmHg  VENTILATOR SETTINGS: Vent Mode: PRVC FiO2 (%):  [40 %] 40 % Set Rate:  [20 bmp] 20 bmp Vt Set:  [530 mL] 530 mL PEEP:  [8 cmH20] 8 cmH20 Plateau Pressure:  [14 cmH20-21 cmH20] 17 cmH20  INTAKE / OUTPUT: I/O last 3 completed shifts: In: 4812.4 [I.V.:1887.4; NG/GT:1775; IV Piggyback:1150] Out: 7342 [Urine:1380; Drains:2050; Stool:300]  PHYSICAL EXAMINATION:  General: Adult male, on vent HEENT: Trach in place  Neuro: Sedated, not following commands, not opening eyes  CV: RRR, no MRG  PULM: Coarse breath sounds, on vent  GI: wound vac in place mid abdomen, remains open  Extremities: +1 BLE edema  Skin: warm, dry   LABS:  BMET Recent Labs  Lab 09/17/17 0440 09/18/17 0455 09/19/17 0317  NA 146* 146* 144  K 3.3* 4.3 3.9  CL 113* 117* 117*  CO2 25 24 23   BUN 30* 27* 29*  CREATININE 1.35* 1.52* 1.58*  GLUCOSE 162* 141* 122*   Electrolytes Recent Labs  Lab 09/14/17 0359  09/17/17 0440 09/18/17 0455 09/19/17 0317  CALCIUM 7.5*   < > 7.0* 7.2* 7.0*  MG 2.3   < > 2.3 2.1 2.1  PHOS 2.6  --   --   --  2.9   < > = values in this interval not  displayed.   CBC Recent Labs  Lab 09/17/17 0440 09/18/17 0455 09/19/17 0317  WBC 8.3 13.8* 10.7*  HGB 9.1* 9.1* 7.9*  HCT 29.5* 29.7* 25.9*  PLT 200 220 224   Coag's No results for input(s): APTT, INR in the last 168 hours.  Sepsis Markers No results for input(s): LATICACIDVEN, PROCALCITON, O2SATVEN in the last 168 hours.  ABG No results for input(s): PHART, PCO2ART, PO2ART in the last 168 hours.  Liver Enzymes Recent Labs  Lab 09/16/17 0301 09/17/17 0440 09/18/17 0906 09/19/17 0317  AST 33 26  --  22  ALT 24 22  --  13*  ALKPHOS 49 52  --  48  BILITOT 0.5 0.5  --  0.5  ALBUMIN 1.9* 1.7* 1.7* 1.5*   Cardiac Enzymes No results for input(s): TROPONINI, PROBNP in the last 168 hours.  Glucose Recent Labs  Lab 09/18/17 0848 09/18/17 1257 09/18/17 1549 09/18/17 1935 09/19/17 0314 09/19/17 0751  GLUCAP 138* 122* 129* 122* 107* 102*   Imaging   STUDIES:  CT ab/pelvis 5/15 > portal venous gas in liver, sigmoid colon with diverticular disease, atrophic left kidney  CULTURES: 5/17 resp > e coli resistant to multiple antibiotics 5/17 blood >  negative  ANTIBIOTICS: 5/15 zosyn > 5/19  5/19 ceftriaxone > 5/23   SIGNIFICANT EVENTS: 5/16 mesenteric angiogram and aorto to SMA bypass 5/16 R hemicolectomy 5/18 ileostomy 5/19 abdominal washout and partial fascial closure 5/21 TF started, hypoxia with turning  5/22 Return to OR for exploration, attempted closure > unable 5/24 Trach 5/26 OR for debridement and dressing change 5/29 Exploratory laparotomy, placement of ABBRA closure system  LINES/TUBES: 5/16 R [place] CVL > 5/24 5/16 ETT > 5/24 5/24 Trach 5/18 R femoral arterial line > 5/24 5/24 LUA PICC  DISCUSSION: 67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 Fascia remains open, agitation barrier to weaning. Trached 5/24., to return to OR 5/26 to place a wound closure device   ASSESSMENT /  PLAN:  PULMONARY A: Acute respiratory failure with hypoxemia with acute pulmonary edema sp trache stomy  CXR 5/28 > Improved lung aeration and appropriately placed support apparatus H/O COPD P:  Full Vent Support > Plans for OR today  VAP Bundle  PRN BD  Trend CXR  CARDIOVASCULAR A:  Hypotension in setting of sepsis vs sedation > improving  H/O PAD, HTN, CAD, MI  P:  Cardiac Monitoring  MAP Goal > 65 mm Hg  Wean Levophed to maintain MAP goal, currently on 2 mcg/min  Monitor QTc. > Avoid QTc prolonging drugs  RENAL A:   Hypokalemia > resolved Acute Kidney Injury  Hypernatremia > Improving  Hyperchloremia P:   Trend BMP  Free water 350 q6 Hold further diureses Replace electrolytes as indicated  GASTROINTESTINAL A:    Ischemic colon, s/p hemicolectomy and ileostomy -exploratory laparotomy, placement of ABBRA closure system 5/29 P:   Per Surgery  Monitor drainage Continue pepcid TF > Currently on hold for OR   HEMATOLOGIC A:   Anemia: stable -Scant bleeding from around trach>> resolved Mild leukocytosis likely stress-induced from surgery P:  Trend CBC Monitor for signs of bleeding after surgery Transfuse per ICU protocol  INFECTIOUS A:   Ischemic bowel, s/p abdominal washout several times -appeared clear on 5/20 E.coli HCAP Afebrile Mild leukocytosis likely stress-induced -Ceftriaxone course completed 5/23 P:   Trend fever and WBC Monitor off antibiotics   ENDOCRINE A:   Hyperglycemia P:   Trend Glucose SSI  NEUROLOGIC A:   Sedation Needs  P:   RASS Goal: -2 Fentanyl and versed infusion > Avoid propofol and precedex  FAMILY  - Updates: Sister Bethena Roys) updated via phone 5/22 by Dr Lake Bells, on 5/24 by Dr Hulen Skains.  She is his Media planner (not married, adult child who is special needs). No family at bedside 5/31. Called sister Bethena Roys and updated via phone.   Hayden Pedro, AGACNP-BC [place] [facility]  Pgr: [phone]   PCCM Pgr: [phone]  Attending Note:  67 year old male with SMA occlusion and ischemic bowel who presents with respiratory failure and septic shock.  Abdomen remains open and is going to the OR today for ?closure.  On exam, lungs with coarse BS.  I reviewed CXR myself, trach is in good position.  Discussed with PCCM-NP.  Will continue full vent support while abdomen is open.  Continue sedation.  Replace electrolytes.  BMET in AM.  PCCM will continue to follow.  The patient is critically ill with multiple organ systems failure and requires high complexity decision making for assessment and support, frequent evaluation and titration of therapies, application of advanced monitoring technologies and extensive interpretation of multiple databases.  Critical Care Time devoted to patient care services described in this note is  32  Minutes. This time reflects time of care of this signee Dr Jennet Maduro. This critical care time does not reflect procedure time, or teaching time or supervisory time of PA/NP/Med student/Med Resident etc but could involve care discussion time.  Rush Farmer, M.D. [facility] Pulmonary/Critical Care Medicine. Pager: [phone]. After hours pager: [phone].
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
## 2017-09-19 NOTE — Op Note (Signed)
EXPLORATORY LAPAROTOMY & DRESSING CHANGE  Procedure Note  Luke Kaufman 09/19/2017   Pre-op Diagnosis: OPEN ABDOMEN     Post-op Diagnosis: same  Procedure(s): EXPLORATORY LAPAROTOMY & DRESSING CHANGE  Surgeon(s): Coralie Keens, MD Judeth Horn, MD  Anesthesia: General  Staff:  Circulator: Phillips Grout, RN Scrub Person: Rolan Bucco Circulator Assistant: Beryle Lathe, RN; Sherlon Handing, RN  Estimated Blood Loss: Minimal               Procedure: The patient was taken from the intensive care unit still intubated to the operating room table.  He was placed upon the operating table and anesthesia was induced.  We removed the previous wound VAC.  The underlying tissue continue to remain viable.   We then tightened all the tension bands going superior to inferior bringing the wound closer together.  This pulled the wound together to 15 cm centrally.  We then replaced the wound VAC black foam sponges.  These were then secured in place.  In order to maintain negative pressure, we had to remove the 2 tightening bands at the lower abdomen below the ostomy.  With an addition of an [place] we were finally able to maintain pressure.  An ostomy appliance was reapplied to his ileostomy.  All counts were correct at the end of the procedure.  He was then taken back, still intubated, to the intensive care unit     [facility]   Date: 09/19/2017  Time: 11:59 AM

## 2017-09-19 NOTE — Anesthesia Preprocedure Evaluation (Signed)
Anesthesia Evaluation  Patient identified by MRN, date of birth, ID band Patient unresponsive    Reviewed: Allergy & Precautions, NPO status , Patient's Chart, lab work & pertinent test results, Unable to perform ROS - Chart review only  History of Anesthesia Complications Negative for: history of anesthetic complications  Airway Mallampati: Intubated       Dental no notable dental hx.    Pulmonary Current Smoker,    Pulmonary exam normal breath sounds clear to auscultation       Cardiovascular hypertension, Pt. on medications + Past MI and + Peripheral Vascular Disease  Normal cardiovascular exam Rhythm:Regular Rate:Normal     Neuro/Psych PSYCHIATRIC DISORDERS Depression CVA    GI/Hepatic Neg liver ROS, PUD, Open abdomen   Endo/Other    Renal/GU Renal InsufficiencyRenal disease     Musculoskeletal   Abdominal   Peds  Hematology  (+) anemia ,   Anesthesia Other Findings   Reproductive/Obstetrics                             Anesthesia Physical  Anesthesia Plan  ASA: III  Anesthesia Plan: General   Post-op Pain Management:    Induction: Intravenous  PONV Risk Score and Plan: 1 and Treatment may vary due to age or medical condition  Airway Management Planned: Tracheostomy  Additional Equipment: None  Intra-op Plan:   Post-operative Plan: Post-operative intubation/ventilation  Informed Consent:   History available from chart only  Plan Discussed with: CRNA and Surgeon  Anesthesia Plan Comments:         Anesthesia Quick Evaluation
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
## 2017-09-19 NOTE — Progress Notes (Signed)
2 Days Post-Op    CC:  Abdominal pain   Subjective: Stable, ileostomy came undone and is pretty hard to fix.  No other issues yesterday.  Objective: Vital signs in last 24 hours: Temp:  [98.1 F (36.7 C)-99.1 F (37.3 C)] 98.8 F (37.1 C) (05/31 0319) Pulse Rate:  [71-124] 74 (05/31 0700) Resp:  [6-26] 20 (05/31 0700) BP: (86-126)/(48-64) 110/52 (05/31 0645) SpO2:  [92 %-100 %] 100 % (05/31 0700) FiO2 (%):  [40 %] 40 % (05/31 0445) Weight:  [121.1 kg (266 lb 15.6 oz)] 121.1 kg (266 lb 15.6 oz) (05/31 0400) Last BM Date: 09/18/17 1230 IV NG 1505 Urine 955 Drain 1250 Stool 300 TM 99.1BP stable Creatinine is stable H/H is down WBC is down some   Intake/Output from previous day: 05/30 0701 - 05/31 0700 In: 3835 [I.V.:1230; NG/GT:1505; IV Piggyback:1100] Out: 2505 [Urine:955; Drains:1250; Stool:300] Intake/Output this shift: No intake/output data recorded.  General appearance: no distress and sedated on the Vent Resp: clear to auscultation bilaterally Cardio: regular rate and rhythm, S1, S2 normal, no murmur, click, rub or gallop GI: Wound vac in place, ABBRA in place, some fluid from ileostomy  Lab Results:  Recent Labs    09/18/17 0455 09/19/17 0317  WBC 13.8* 10.7*  HGB 9.1* 7.9*  HCT 29.7* 25.9*  PLT 220 224    BMET Recent Labs    09/18/17 0455 09/19/17 0317  NA 146* 144  K 4.3 3.9  CL 117* 117*  CO2 24 23  GLUCOSE 141* 122*  BUN 27* 29*  CREATININE 1.52* 1.58*  CALCIUM 7.2* 7.0*   PT/INR No results for input(s): LABPROT, INR in the last 72 hours.  Recent Labs  Lab 09/16/17 0301 09/17/17 0440 09/18/17 0906 09/19/17 0317  AST 33 26  --  22  ALT 24 22  --  13*  ALKPHOS 49 52  --  48  BILITOT 0.5 0.5  --  0.5  PROT 5.0* 4.6*  --  4.3*  ALBUMIN 1.9* 1.7* 1.7* 1.5*     Lipase     Component Value Date/Time   LIPASE 37 09/03/2017 1644     Medications: . chlorhexidine gluconate (MEDLINE KIT)  15 mL Mouth Rinse BID  . docusate  100  mg Per Tube Daily  . feeding supplement (PRO-STAT SUGAR FREE 64)  30 mL Per Tube TID  . feeding supplement (VITAL HIGH PROTEIN)  1,000 mL Per Tube Q24H  . free water  350 mL Per Tube Q6H  . insulin aspart  0-15 Units Subcutaneous Q4H  . mouth rinse  15 mL Mouth Rinse 10 times per day  . sodium chloride flush  10-40 mL Intracatheter Q12H    Assessment/Plan 1 ultrasound right groin,2 cannulation right common femoral artery,3 nonselective aortogram,4 nonselective mesenteric angiogram,5 aorto to superior mesenteric artery bypass (8 mmDacron), Dr Ruta Hinds, 09/04/17 1.Exploratory laparotomy right hemicolectomy 09/04/2017 Dr. Stark Klein 2.Abdominal exploration, application of temporary abdominal closure device(ABTHERA), 09/04/2017 Dr. Nadeen Landau 3.Oratory laparotomy, ileostomy, application of open abdominal wound VAC, 09/06/2017 Dr. Georganna Skeans 4. Open abdomen for attempted closure, 09/08/2017 Dr. Judeth Horn 5. Oratory laparotomy with possible wound closure(prior sutures upper&lower portion of the fascia had broken), 09/10/2017 Dr. Judeth Horn 6. Reexploration open abdominal wound, application ofABTHERAWOUND VAC,09/14/2017 Dr. Greer Pickerel 7.  Exploratory laparotomy, placement of ABBRA closure system, 09/17/17, Dr. Judeth Horn  Acute respiratory failure,with E. ColiHCAP -full vent support Encephalopathy/agitation-fentanyl/propofol Hypertension, CAD, PVD- Malnutrition HxTobacco use 2 packs/day Prior CVA Body mass index is  25    FEN:IV fluids/Tube feeding at 30 ml hr/Currently feeding is off NG on suction preop SN:KNLZJ 5/16-5/23/2019;Rocephin 5/19-5/23/2019, pre op 09/17/17 DVT: SCD Follow up: TBD  Plan:  OR for dressing change today.   Discuss tube feeding rate with DR. Ninfa Linden.        LOS: 15 days    Quasim Doyon 09/19/2017 [phone]

## 2017-09-20 ENCOUNTER — Inpatient Hospital Stay (HOSPITAL_COMMUNITY): Payer: PPO

## 2017-09-20 DIAGNOSIS — J9600 Acute respiratory failure, unspecified whether with hypoxia or hypercapnia: Secondary | ICD-10-CM

## 2017-09-20 LAB — CBC
HEMATOCRIT: 27.9 % — AB (ref 39.0–52.0)
HEMOGLOBIN: 8.6 g/dL — AB (ref 13.0–17.0)
MCH: 29.3 pg (ref 26.0–34.0)
MCHC: 30.8 g/dL (ref 30.0–36.0)
MCV: 94.9 fL (ref 78.0–100.0)
Platelets: 236 10*3/uL (ref 150–400)
RBC: 2.94 MIL/uL — AB (ref 4.22–5.81)
RDW: 14.9 % (ref 11.5–15.5)
WBC: 10.8 10*3/uL — ABNORMAL HIGH (ref 4.0–10.5)

## 2017-09-20 LAB — BASIC METABOLIC PANEL
Anion gap: 7 (ref 5–15)
BUN: 25 mg/dL — AB (ref 6–20)
CHLORIDE: 113 mmol/L — AB (ref 101–111)
CO2: 21 mmol/L — ABNORMAL LOW (ref 22–32)
CREATININE: 1.4 mg/dL — AB (ref 0.61–1.24)
Calcium: 7.2 mg/dL — ABNORMAL LOW (ref 8.9–10.3)
GFR calc Af Amer: 59 mL/min — ABNORMAL LOW (ref >60)
GFR calc non Af Amer: 50 mL/min — ABNORMAL LOW (ref >60)
GLUCOSE: 118 mg/dL — AB (ref 65–99)
POTASSIUM: 3.9 mmol/L (ref 3.5–5.1)
Sodium: 141 mmol/L (ref 135–145)

## 2017-09-20 LAB — MAGNESIUM: Magnesium: 2.1 mg/dL (ref 1.7–2.4)

## 2017-09-20 LAB — GLUCOSE, CAPILLARY
GLUCOSE-CAPILLARY: 102 mg/dL — AB (ref 65–99)
GLUCOSE-CAPILLARY: 122 mg/dL — AB (ref 65–99)
Glucose-Capillary: 108 mg/dL — ABNORMAL HIGH (ref 65–99)
Glucose-Capillary: 120 mg/dL — ABNORMAL HIGH (ref 65–99)
Glucose-Capillary: 129 mg/dL — ABNORMAL HIGH (ref 65–99)

## 2017-09-20 LAB — PHOSPHORUS: Phosphorus: 3.2 mg/dL (ref 2.5–4.6)

## 2017-09-20 NOTE — Progress Notes (Signed)
Progress Note: General Surgery Service   Assessment/Plan: Patient Active Problem List   Diagnosis Date Noted  . Abdominal pain 09/04/2017  . PUD (peptic ulcer disease) 09/04/2017  . Mesenteric ischemia ([place]) 09/04/2017  . 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 Left arm numbness 12/14/2015  . Numbness 12/14/2015  . Bilateral carotid artery stenosis 11/22/2015  . Recurrent major depressive disorder in partial remission ([place]) 10/19/2015  . Mitral valve prolapse 09/07/2015  . Vitamin D deficiency 08/31/2015  . Arterial atherosclerosis 04/10/2015  . Cataract 04/10/2015  . Cerebrovascular accident, old 04/10/2015  . Gonalgia 04/10/2015  . HLD (hyperlipidemia) 04/10/2015  . 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 Renal artery stenosis ([place]) 04/10/2015  . History of non-ST elevation myocardial infarction (NSTEMI) 04/10/2015  . Anemia associated with acute blood loss 08/24/2014  . Thrombocytopenia ([place])   . Hypokalemia   . Essential hypertension 08/09/2014  . Chest pain 08/09/2014  . Near syncope 08/09/2014  . CKD (chronic kidney disease) stage 3, GFR 30-59 ml/min (HCC) 08/09/2014  . Tobacco abuse 08/09/2014  . Upper GI bleeding   . Gastric ulcer with hemorrhage   . Acute blood loss anemia   . CVA (cerebral infarction) 05/16/2013  . HTN (hypertension) 05/16/2013  . 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   s/p Procedure(s): EXPLORATORY LAPAROTOMY & DRESSING CHANGE 09/19/2017  Continue abra manipulation daily Continue tube feeds Plan for closure next week   LOS: 16 days  Chief Complaint/Subjective: Went up on levophed overnight, thick secretions, new foley placed due to occlusion  Objective: Vital signs in last 24 hours: Temp:  [97.9 F (36.6 C)-98.7 F (37.1 C)] 97.9 F (36.6 C) (06/01 0733) Pulse Rate:  [53-97] 69 (06/01 0700) Resp:  [0-25] 20 (06/01 0700) BP: (92-168)/(37-85) 136/49 (06/01 0700) SpO2:  [90 %-100 %] 100 % (06/01 0700) FiO2 (%):   [40 %] 40 % (06/01 0321) Weight:  [121.7 kg (268 lb 4.8 oz)] 121.7 kg (268 lb 4.8 oz) (06/01 0430) Last BM Date: 09/20/17  Intake/Output from previous day: 05/31 0701 - 06/01 0700 In: 3258.8 [I.V.:1468.8; NG/GT:1740; IV Piggyback:50] Out: 1870 [Urine:1020; Emesis/NG output:100; Drains:700; Stool:50] Intake/Output this shift: No intake/output data recorded.  Lungs: coarse b/l, assisted  Cardiovascular: RRR  Abd: open abdomen with ABRA device and sponge in place  Extremities: +edema  Neuro: moves extremities on stimulus, sedated  Lab Results: CBC  Recent Labs    09/19/17 0317 09/20/17 0430  WBC 10.7* 10.8*  HGB 7.9* 8.6*  HCT 25.9* 27.9*  PLT 224 236   BMET Recent Labs    09/19/17 0317 09/20/17 0430  NA 144 141  K 3.9 3.9  CL 117* 113*  CO2 23 21*  GLUCOSE 122* 118*  BUN 29* 25*  CREATININE 1.58* 1.40*  CALCIUM 7.0* 7.2*   PT/INR No results for input(s): LABPROT, INR in the last 72 hours. ABG No results for input(s): PHART, HCO3 in the last 72 hours.  Invalid input(s): PCO2, PO2  Studies/Results:  Anti-infectives: Anti-infectives (From admission, onward)   Start     Dose/Rate Route Frequency Ordered Stop   09/19/17 0730  ceFAZolin (ANCEF) IVPB 2g/100 mL premix     2 g 200 mL/hr over 30 Minutes Intravenous On call to O.R. 09/19/17 0715 09/19/17 1057   09/17/17 1145  ceFAZolin (ANCEF) 3 g in dextrose 5 % 50 mL IVPB     3 g 100 mL/hr over 30 Minutes Intravenous To ShortStay Surgical 09/17/17 0923 09/17/17 1449   09/07/17 1545  cefTRIAXone (ROCEPHIN)  2 g in sodium chloride 0.9 % 100 mL IVPB     2 g 200 mL/hr over 30 Minutes Intravenous Every 24 hours 09/07/17 1543 09/11/17 1605   09/04/17 1400  piperacillin-tazobactam (ZOSYN) IVPB 3.375 g  Status:  Discontinued     3.375 g 12.5 mL/hr over 240 Minutes Intravenous Every 8 hours 09/04/17 1157 09/07/17 1541   09/04/17 1100  ceFAZolin (ANCEF) IVPB 2g/100 mL premix  Status:  Discontinued     2 g 200 mL/hr  over 30 Minutes Intravenous Every 8 hours 09/04/17 1035 09/04/17 1157   09/04/17 0630  piperacillin-tazobactam (ZOSYN) IVPB 3.375 g  Status:  Discontinued     3.375 g 100 mL/hr over 30 Minutes Intravenous  Once 09/04/17 0617 09/04/17 0626   09/04/17 0619  piperacillin-tazobactam (ZOSYN) IVPB 3.375 g     3.375 g 100 mL/hr over 30 Minutes Intravenous 60 min pre-op 09/04/17 0620 09/04/17 0717   09/04/17 0030  piperacillin-tazobactam (ZOSYN) IVPB 3.375 g     3.375 g 100 mL/hr over 30 Minutes Intravenous  Once 09/04/17 0024 09/04/17 0144      Medications: Scheduled Meds: . chlorhexidine gluconate (MEDLINE KIT)  15 mL Mouth Rinse BID  . docusate  100 mg Per Tube Daily  . feeding supplement (PRO-STAT SUGAR FREE 64)  30 mL Per Tube TID  . feeding supplement (VITAL HIGH PROTEIN)  1,000 mL Per Tube Q24H  . free water  350 mL Per Tube Q6H  . insulin aspart  0-15 Units Subcutaneous Q4H  . mouth rinse  15 mL Mouth Rinse 10 times per day  . sodium chloride flush  10-40 mL Intracatheter Q12H   Continuous Infusions: . sodium chloride 10 mL/hr at 09/20/17 0700  . famotidine (PEPCID) IV Stopped (09/19/17 2320)  . fentaNYL infusion INTRAVENOUS 400 mcg/hr (09/20/17 0600)  . magnesium sulfate 1 - 4 g bolus IVPB    . midazolam (VERSED) infusion 4 mg/hr (09/20/17 0600)  . norepinephrine (LEVOPHED) Adult infusion 5 mcg/min (09/20/17 0600)   PRN Meds:.acetaminophen **OR** acetaminophen, alum & mag hydroxide-simeth, bisacodyl, fentaNYL (SUBLIMAZE) injection, hydrALAZINE, ipratropium-albuterol, magnesium sulfate 1 - 4 g bolus IVPB, midazolam, ondansetron **OR** ondansetron (ZOFRAN) IV, phenol, pneumococcal 23 valent vaccine, sodium chloride flush  Mickeal Skinner, MD Pg# [phone] [facility]

## 2017-09-20 NOTE — Progress Notes (Signed)
PULMONARY / CRITICAL CARE MEDICINE   Name: Luke Kaufman MRN: [id number] DOB: [birth year]    ADMISSION DATE:  09/03/2017 CONSULTATION DATE:  5/16  REFERRING MD:  Oneida Alar  CHIEF COMPLAINT:  Post Op Vent management  Brief history:   67 y/o male presented on 5/15 with abdominal pain fond to have ischemic right colon in setting of SMA occlusion requiring emergent bypass and eventually R hemicolectomy and ilestomy. Fascia remains open, agitation barrier to weaning. Trached 5/24., to return to OR 5/26 to place a wound closure device   STUDIES:  CT ab/pelvis 5/15 > portal venous gas in liver, sigmoid colon with diverticular disease, atrophic left kidney  CULTURES: 5/17 resp > e coli resistant to multiple antibiotics 5/17 blood >  negative  ANTIBIOTICS: 5/15 zosyn > 5/19  5/19 ceftriaxone > 5/23   LINES/TUBES: 5/16 [place] CVL > 5/24 5/16 ETT > 5/24 5/24 Trach 5/18 R femoral arterial line > 5/24 5/24 LUA PICC   SIGNIFICANT EVENTS: 5/16 mesenteric angiogram and aorto to SMA bypass 5/16 R hemicolectomy 5/18 ileostomy 5/19 abdominal washout and partial fascial closure 5/21 TF started, hypoxia with turning  5/22 Return to OR for exploration, attempted closure > unable 5/24 Trach 5/26 OR for debridement and dressing change 5/29 Exploratory laparotomy, placement of ABBRA closure system 5/31 - No events overnight. Plans to return to OR for dressing change     SUBJECTIVE/OVERNIGHT/INTERVAL HX 6/1 - sedted wth fent and versed gtt and on levophed. Partial WUA - moved all 4s per RN. No other events. CCS plans for closure sometime week of 09/22/17. New foley placed due to occlusion  VITAL SIGNS: BP 131/61   Pulse 86   Temp 97.9 F (36.6 C) (Oral)   Resp 20   Ht 5\' 7"  (1.702 m)   Wt 121.7 kg (268 lb 4.8 oz)   SpO2 99%   BMI 42.02 kg/m   HEMODYNAMICS: CVP:  [7 mmHg-16 mmHg] 7 mmHg  VENTILATOR SETTINGS: Vent Mode: PRVC FiO2 (%):  [40 %] 40 % Set Rate:  [20 bmp] 20 bmp Vt Set:   [530 mL] 530 mL PEEP:  [5 cmH20] 5 cmH20 Plateau Pressure:  [15 cmH20-18 cmH20] 15 cmH20  INTAKE / OUTPUT: I/O last 3 completed shifts: In: 4244.6 [I.V.:2039.6; NG/GT:2105; IV Piggyback:100] Out: 1962 [Urine:1620; Emesis/NG output:100; Drains:1400; Stool:200]  PHYSICAL EXAMINATION:   General Appearance:    Looks criticall ill OBESE - +  Head:    Normocephalic, without obvious abnormality, atraumatic  Eyes:    PERRL - yes, conjunctiva/corneas - clear      Ears:    Normal external ear canals, both ears  Nose:   NG tube - yes abd n TF  Throat:  ETT TUBE - no but has TRACH , OG tube - no  Neck:   Supple,  No enlargement/tenderness/nodules     Lungs:     Clear to auscultation bilaterally, Ventilator   Synchrony - yes  Chest wall:    No deformity  Heart:    S1 and S2 normal, no murmur, CVP - no.  Pressors - yes on levophed  Abdomen:     Soft, no masses, no organomegaly  Genitalia:    Not done  Rectal:   not done  Extremities:   Extremities- intact     Skin:   Intact in exposed areas .     Neurologic:   Sedation - fent gtt + versed gtt -> RASS - -3 . Moves all 4s - yes during [place]. CAM-ICU -  na . Orientation - na    PULMONARY No results for input(s): PHART, PCO2ART, PO2ART, HCO3, TCO2, O2SAT in the last 168 hours.  Invalid input(s): PCO2, PO2  CBC Recent Labs  Lab 09/18/17 0455 09/19/17 0317 09/20/17 0430  HGB 9.1* 7.9* 8.6*  HCT 29.7* 25.9* 27.9*  WBC 13.8* 10.7* 10.8*  PLT 220 224 236    COAGULATION No results for input(s): INR in the last 168 hours.  CARDIAC  No results for input(s): TROPONINI in the last 168 hours. No results for input(s): PROBNP in the last 168 hours.   CHEMISTRY Recent Labs  Lab 09/14/17 0359  09/16/17 0301 09/17/17 0440 09/18/17 0455 09/19/17 0317 09/20/17 0430  NA 143   < > 150* 146* 146* 144 141  K 3.4*   < > 3.6 3.3* 4.3 3.9 3.9  CL 106   < > 114* 113* 117* 117* 113*  CO2 27   < > 27 25 24 23  21*  GLUCOSE 132*   < > 111* 162*  141* 122* 118*  BUN 26*   < > 35* 30* 27* 29* 25*  CREATININE 1.53*   < > 1.53* 1.35* 1.52* 1.58* 1.40*  CALCIUM 7.5*   < > 7.4* 7.0* 7.2* 7.0* 7.2*  MG 2.3  --  2.3 2.3 2.1 2.1 2.1  PHOS 2.6  --   --   --   --  2.9 3.2   < > = values in this interval not displayed.   Estimated Creatinine Clearance: 63.9 mL/min (A) (by C-G formula based on SCr of 1.4 mg/dL (H)).   LIVER Recent Labs  Lab 09/16/17 0301 09/17/17 0440 09/18/17 0906 09/19/17 0317  AST 33 26  --  22  ALT 24 22  --  13*  ALKPHOS 49 52  --  48  BILITOT 0.5 0.5  --  0.5  PROT 5.0* 4.6*  --  4.3*  ALBUMIN 1.9* 1.7* 1.7* 1.5*     INFECTIOUS No results for input(s): LATICACIDVEN, PROCALCITON in the last 168 hours.   ENDOCRINE CBG (last 3)  Recent Labs    09/20/17 0033 09/20/17 0506 09/20/17 0727  GLUCAP 120* 108* 102*         IMAGING x48h  - image(s) personally visualized  -   highlighted in bold Dg Chest Port 1 View  Result Date: 09/20/2017 CLINICAL DATA:  Followup ventilator support. EXAM: PORTABLE CHEST 1 VIEW COMPARISON:  09/19/2017 FINDINGS: Tracheostomy in place. Nasogastric tube enters the abdomen. Left arm PICC tip at the SVC RA junction. Persistent lower lobe atelectasis/infiltrate, right worse than left. Possible associated effusion, particularly on the right. Upper lobes remain clear. No new finding. IMPRESSION: Persistent lower lobe atelectasis/infiltrate. Findings worse on the right, possibly with associated right effusion. Electronically Signed   By: Nelson Chimes M.D.   On: 09/20/2017 08:09   Dg Chest Port 1 View  Result Date: 09/19/2017 CLINICAL DATA:  Tracheostomy.  Acute respiratory failure. EXAM: PORTABLE CHEST 1 VIEW COMPARISON:  09/16/2017. FINDINGS: Tracheostomy tube, NG tube, left PICC line in stable position. Cardiomegaly with bilateral pulmonary interstitial prominence and bilateral pleural effusions consistent with congestive heart failure noted on today's exam. Persistent bibasilar  atelectasis. No pneumothorax. IMPRESSION: 1.  Lines and tubes stable position. 2. Cardiomegaly with bilateral pulmonary interstitial prominence and bilateral pleural effusions consistent with CHF noted on today's exam. 3.  Persistent bibasilar atelectasis. Electronically Signed   By: Marcello Moores  Register   On: 09/19/2017 08:51     DISCUSSION: 67 y/o  male with an SMA occlusion requiring emergent bypass and eventually R hemicolectomy and ilestomy. Fascia remains open, agitation barrier to weaning. Trached 5/24., to return to OR 5/26 to place a wound closure device   ASSESSMENT / PLAN:  PULMONARY A: Acute respiratory failure with hypoxemia with acute pulmonary edema sp trache stomy  CXR 5/28 > Improved lung aeration and appropriately placed support apparatus H/O COPD  09/20/2017 - > does not meet criteria for SBT/Extubation in setting of Acute Respiratory Failure due to p-lap, need to go to OR, sedation and circulatory shock   P:  Full vent support VAP Bundle  PRN BD  Trend CXR  CARDIOVASCULAR A:  Hypotension in setting of sepsis vs sedation > improving  H/O PAD, HTN, CAD, MI   09/20/2017 ->  yes on Pressor levophed   P:  MAP goal > 65 Cardiac Monitoring  Monitor QTc. > Avoid QTc prolonging drugs  RENAL A:   Hypokalemia > resolved Acute Kidney Injury  Hypernatremia > Improving  Hyperchloremia  09/20/2017 - improving AKI  P:   Monitor coninue free water Hold further diureses Replace electrolytes as indicated  GASTROINTESTINAL A:    Ischemic colon, s/p hemicolectomy and ileostomy -exploratory laparotomy, placement of ABBRA closure system 5/29  09/30/17 - ongoing TF  P:   Per ccs Monitor drainage Continue pepcid   HEMATOLOGIC Recent Labs  Lab 09/18/17 0455 09/19/17 0317 09/20/17 0430  HGB 9.1* 7.9* 8.6*  HCT 29.7* 25.9* 27.9*  WBC 13.8* 10.7* 10.8*  PLT 220 224 236    A:   Anemia: stable -Scant bleeding from around trach>> resolved Mild leukocytosis  likely stress-induced from surgery P:  - PRBC for hgb </= 6.9gm%    - exceptions are   -  if ACS susepcted/confirmed then transfuse for hgb </= 8.0gm%,  or    -  active bleeding with hemodynamic instability, then transfuse regardless of hemoglobin value   At at all times try to transfuse 1 unit prbc as possible with exception of active hemorrhage   INFECTIOUS A:   Ischemic bowel, s/p abdominal washout several times -appeared clear on 5/20 E.coli HCAP Afebrile Mild leukocytosis likely stress-induced -Ceftriaxone course completed 5/23   P:   Trend fever and WBC Monitor off antibiotics   ENDOCRINE A:   Hyperglycemia P:   Trend Glucose SSI  NEUROLOGIC A:   Sedation Needs  P:   RASS Goal: -2 Fentanyl and versed infusion > Avoid propofol and precedex  FAMILY  - Updates: Sister Luke Kaufman) updated via phone 5/22 by Dr Lake Bells, on 5/24 by Dr Hulen Skains.  She is his Media planner (not married, adult child who is special needs). No family at bedside 5/31. Called sister Luke Kaufman and updated via phone. None on bedside6/1/19      The patient is critically ill with multiple organ systems failure and requires high complexity decision making for assessment and support, frequent evaluation and titration of therapies, application of advanced monitoring technologies and extensive interpretation of multiple databases.   Critical Care Time devoted to patient care services described in this note is  30  Minutes. This time reflects time of care of this signee Dr Brand Males. This critical care time does not reflect procedure time, or teaching time or supervisory time of PA/NP/Med student/Med Resident etc but could involve care discussion time    Dr. Brand Males, M.D., [facility].C.P Pulmonary and Critical Care Medicine Staff Physician [facility] Pager: [phone], If no answer  or between  15:00h - 7:00h: call [phone]  09/20/2017 9:51  AM

## 2017-09-20 NOTE — Progress Notes (Signed)
Dr Alyson Ingles (urology) informed that foley catheter is occluded; flushed without success. Order received to insert new foley and call back MD if needed.

## 2017-09-21 ENCOUNTER — Inpatient Hospital Stay (HOSPITAL_COMMUNITY): Payer: PPO

## 2017-09-21 DIAGNOSIS — J9620 Acute and chronic respiratory failure, unspecified whether with hypoxia or hypercapnia: Secondary | ICD-10-CM

## 2017-09-21 DIAGNOSIS — J9690 Respiratory failure, unspecified, unspecified whether with hypoxia or hypercapnia: Secondary | ICD-10-CM

## 2017-09-21 LAB — PHOSPHORUS: PHOSPHORUS: 2.9 mg/dL (ref 2.5–4.6)

## 2017-09-21 LAB — BASIC METABOLIC PANEL
ANION GAP: 4 — AB (ref 5–15)
BUN: 23 mg/dL — ABNORMAL HIGH (ref 6–20)
CALCIUM: 7 mg/dL — AB (ref 8.9–10.3)
CHLORIDE: 116 mmol/L — AB (ref 101–111)
CO2: 22 mmol/L (ref 22–32)
Creatinine, Ser: 1.3 mg/dL — ABNORMAL HIGH (ref 0.61–1.24)
GFR calc non Af Amer: 55 mL/min — ABNORMAL LOW (ref >60)
Glucose, Bld: 96 mg/dL (ref 65–99)
POTASSIUM: 3.8 mmol/L (ref 3.5–5.1)
Sodium: 142 mmol/L (ref 135–145)

## 2017-09-21 LAB — MAGNESIUM: Magnesium: 1.9 mg/dL (ref 1.7–2.4)

## 2017-09-21 LAB — GLUCOSE, CAPILLARY
GLUCOSE-CAPILLARY: 105 mg/dL — AB (ref 65–99)
GLUCOSE-CAPILLARY: 98 mg/dL (ref 65–99)
Glucose-Capillary: 89 mg/dL (ref 65–99)
Glucose-Capillary: 88 mg/dL (ref 65–99)
Glucose-Capillary: 107 mg/dL — ABNORMAL HIGH (ref 65–99)
Glucose-Capillary: 85 mg/dL (ref 65–99)
Glucose-Capillary: 96 mg/dL (ref 65–99)

## 2017-09-21 MED ORDER — MAGNESIUM SULFATE 2 GM/50ML IV SOLN
2.0000 g | Freq: Once | INTRAVENOUS | Status: AC
  Administered 2017-09-21: 2 g via INTRAVENOUS
  Filled 2017-09-21: qty 50

## 2017-09-21 MED ORDER — FREE WATER
300.0000 mL | Freq: Four times a day (QID) | Status: DC
  Administered 2017-09-21 – 2017-09-23 (×8): 300 mL

## 2017-09-21 NOTE — Progress Notes (Addendum)
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 Fascia remains open, agitation barrier to weaning. Trached 5/24., to return to OR 5/26 to place a wound closure device   STUDIES:  CT ab/pelvis 5/15 > portal venous gas in liver, sigmoid colon with diverticular disease, atrophic left kidney  CULTURES: 5/17 resp > e coli resistant to multiple antibiotics 5/17 blood >  negative  ANTIBIOTICS: 5/15 zosyn > 5/19  5/19 ceftriaxone > 5/23   LINES/TUBES: 5/16 R [place] CVL > 5/24 5/16 ETT > 5/24 5/24 Trach 5/18 R femoral arterial line > 5/24 5/24 LUA PICC   SIGNIFICANT EVENTS: 5/16 mesenteric angiogram and aorto to SMA bypass 5/16 R hemicolectomy 5/18 ileostomy 5/19 abdominal washout and partial fascial closure 5/21 TF started, hypoxia with turning  5/22 Return to OR for exploration, attempted closure > unable 5/24 Trach 5/26 OR for debridement and dressing change 5/29 Exploratory laparotomy, placement of ABBRA closure system 5/31 - No events overnight. Plans to return to OR for dressing change     SUBJECTIVE/OVERNIGHT/INTERVAL HX Wound again manipulated by surgical team trying to bring abdomen closer together  VITAL SIGNS: Blood Pressure (Abnormal) 113/56   Pulse 80   Temperature 98 F (36.7 C) (Oral)   Respiration 20   Height 5\' 7"  (1.702 m)   Weight 269 lb 10 oz (122.3 kg)   Oxygen Saturation 100%   Body Mass Index 42.23 kg/m   HEMODYNAMICS: CVP:  [6 mmHg-10 mmHg] 6 mmHg  VENTILATOR SETTINGS: Vent Mode: PRVC FiO2 (%):  [40 %] 40 % Set Rate:  [20 bmp] 20 bmp Vt Set:  [530 mL] 530 mL PEEP:  [5 cmH20] 5  cmH20 Plateau Pressure:  [13 cmH20-16 cmH20] 16 cmH20  INTAKE / OUTPUT: I/O last 3 completed shifts: In: 5674.7 [I.V.:1994.7; NG/GT:3580; IV Piggyback:100] Out: 3805 [Urine:1855; Drains:1250; Stool:700]  Physical exam General: This is a chronically ill-appearing 67 year old male he is currently sedated on full ventilator support HEENT normocephalic atraumatic.  Has a #6 tracheostomy which is unremarkable Pulmonary: Mechanically assisted ventilation clear to auscultation diminished in the bases Cardiac: Regular rate and rhythm on telemetry normal sinus. Abdomen: Open.  Wound VAC intact, tolerating tube feeds.  Ileostomy unremarkable. GU: Clear yellow Neuro: Heavily sedated Extremities: Dependent edema.  PULMONARY No results for input(s): PHART, PCO2ART, PO2ART, HCO3, TCO2, O2SAT in the last 168 hours.  Invalid input(s): PCO2, PO2  CBC Recent Labs  Lab 09/18/17 0455 09/19/17 0317 09/20/17 0430  HGB 9.1* 7.9* 8.6*  HCT 29.7* 25.9* 27.9*  WBC 13.8* 10.7* 10.8*  PLT 220 224 236    COAGULATION No results for input(s): INR in the last 168 hours.  CARDIAC  No results for input(s): TROPONINI in the last 168 hours. No results for input(s): PROBNP in the last 168 hours.   CHEMISTRY Recent Labs  Lab 09/17/17 0440 09/18/17 0455 09/19/17 0317 09/20/17 0430 09/21/17 0205  NA 146* 146* 144 141 142  K 3.3* 4.3 3.9 3.9 3.8  CL 113* 117* 117* 113* 116*  CO2 25 24 23  21* 22  GLUCOSE 162* 141* 122* 118* 96  BUN 30* 27* 29* 25* 23*  CREATININE  1.35* 1.52* 1.58* 1.40* 1.30*  CALCIUM 7.0* 7.2* 7.0* 7.2* 7.0*  MG 2.3 2.1 2.1 2.1 1.9  PHOS  --   --  2.9 3.2 2.9   Estimated Creatinine Clearance: 69.1 mL/min (A) (by C-G formula based on SCr of 1.3 mg/dL (H)).   LIVER Recent Labs  Lab 09/16/17 0301 09/17/17 0440 09/18/17 0906 09/19/17 0317  AST 33 26  --  22  ALT 24 22  --  13*  ALKPHOS 49 52  --  48  BILITOT 0.5 0.5  --  0.5  PROT 5.0* 4.6*  --  4.3*  ALBUMIN 1.9*  1.7* 1.7* 1.5*     INFECTIOUS No results for input(s): LATICACIDVEN, PROCALCITON in the last 168 hours.   ENDOCRINE CBG (last 3)  Recent Labs    09/20/17 2351 09/21/17 0405 09/21/17 0743  GLUCAP 105* 89 88     IMAGING x48h  - image(s) personally visualized  -   highlighted in bold Dg Chest Port 1 View  Result Date: 09/21/2017 CLINICAL DATA:  Respiratory failure EXAM: PORTABLE CHEST 1 VIEW COMPARISON:  September 20, 2017 FINDINGS: The tracheostomy tube and left PICC line are stable. The NG tube terminates in the stomach. No pneumothorax. Bilateral pleural effusions with underlying opacities are stable. Stable cardiomediastinal silhouette. Stable tracheostomy tube. IMPRESSION: 1. Stable support apparatus as above. 2. Stable bilateral pleural effusions with underlying opacities. Electronically Signed   By: Dorise Bullion III M.D   On: 09/21/2017 07:11   Dg Chest Port 1 View  Result Date: 09/20/2017 CLINICAL DATA:  Followup ventilator support. EXAM: PORTABLE CHEST 1 VIEW COMPARISON:  09/19/2017 FINDINGS: Tracheostomy in place. Nasogastric tube enters the abdomen. Left arm PICC tip at the SVC RA junction. Persistent lower lobe atelectasis/infiltrate, right worse than left. Possible associated effusion, particularly on the right. Upper lobes remain clear. No new finding. IMPRESSION: Persistent lower lobe atelectasis/infiltrate. Findings worse on the right, possibly with associated right effusion. Electronically Signed   By: Nelson Chimes M.D.   On: 09/20/2017 08:09     DISCUSSION: 67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 Fascia remains open, agitation barrier to weaning. Trached 5/24., to return to OR 5/26 to place a wound closure device again back to the OR on 5/31.  He is now getting daily manipulation of the Montgomery straps to try to encourage closure.  Looks like we have a long course of head of [place].  We will continue full  ventilatory support, will need to coordinate with surgical services Re: Timing of weaning in regards to his abdomen for now we will continue sedation and supportive care.  ASSESSMENT / PLAN: Resolved issues: Hypernatremia  Active issues:  Ischemic bowel, s/p resection with hemicolectomy and ileostomy; now status post abdominal washout several times Plan Continuing wound VAC Continuing tube feeds which are at goal Eventually back to the OR next week for closure of abdominal cavity   Acute respiratory failure with hypoxemia with acute pulmonary edema  Status post tracheostomy 5/24 H/O COPD Portable chest x-ray personally reviewed: Right greater than left effusions and atelectasis no significant change when compared to prior film Plan Continuing full ventilator support PAD protocol RASS goal -2 Holding weaning his abdominal cavity still open  Hypotension.  Favor sedation related History of coronary artery disease, prior MI, hypertension and peripheral vascular disease Plan Continue norepinephrine for mean arterial pressure greater than 65 Continue telemetry monitoring Eventually add back aspirin, Lipitor, and Lopressor, hold  this for now given hypotension   Acute kidney injury Creatinine continues to improve Plan Avoid hypotension Renal dose medications Strict intake output Follow-up a.m. Chemistry  Fluid and electrolyte imbalance: Hyperchloremia -15.5 L positive since admit Plan Continuing free water (will back down a little on free water volume) Following a.m. chemistry  Anemia of critical illness -No evidence of active bleeding currently Plan Trend CBC Transfuse for protocol if hemoglobin less than 7  Mild leukocytosis, no fevers Plan Trend CBC and fever curve  Hyperglycemia Has excellent glycemic control Plan Continuing sliding scale insulin   FAMILY  - Updates: Sister Bethena Roys) updated via phone 5/22 by Dr Lake Bells, on 5/24 by Dr Hulen Skains.  She is his  Media planner (not married, adult child who is special needs). No family at bedside 5/31. Called sister Bethena Roys and updated via phone. None on bedside6/1/19  DVT prophylaxis: scds SUP: H2B Diet: tubefeeds Activity: BR Disposition : ICU  My critical care time is 35 minutes  Erick Colace ACNP-BC [place] Pager # [phone] OR # [phone] if no answer  09/21/2017 9:17 AM

## 2017-09-21 NOTE — Progress Notes (Signed)
Progress Note: General Surgery Service   Assessment/Plan: Patient Active Problem List   Diagnosis Date Noted  . Abdominal pain 09/04/2017  . PUD (peptic ulcer disease) 09/04/2017  . Mesenteric ischemia ([place]) 09/04/2017  . 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 Left arm numbness 12/14/2015  . Numbness 12/14/2015  . Bilateral carotid artery stenosis 11/22/2015  . Recurrent major depressive disorder in partial remission ([place]) 10/19/2015  . Mitral valve prolapse 09/07/2015  . Vitamin D deficiency 08/31/2015  . Arterial atherosclerosis 04/10/2015  . Cataract 04/10/2015  . Cerebrovascular accident, old 04/10/2015  . Gonalgia 04/10/2015  . HLD (hyperlipidemia) 04/10/2015  . 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 Renal artery stenosis ([place]) 04/10/2015  . History of non-ST elevation myocardial infarction (NSTEMI) 04/10/2015  . Anemia associated with acute blood loss 08/24/2014  . Thrombocytopenia ([place])   . Hypokalemia   . Essential hypertension 08/09/2014  . Chest pain 08/09/2014  . Near syncope 08/09/2014  . CKD (chronic kidney disease) stage 3, GFR 30-59 ml/min (HCC) 08/09/2014  . Tobacco abuse 08/09/2014  . Upper GI bleeding   . Gastric ulcer with hemorrhage   . Acute blood loss anemia   . CVA (cerebral infarction) 05/16/2013  . HTN (hypertension) 05/16/2013  . 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   s/p Procedure(s): EXPLORATORY LAPAROTOMY & DRESSING CHANGE 09/19/2017 -manipulation performed today -continue wound vac -continue tube feeds at goal    LOS: 17 days  Chief Complaint/Subjective: Able to wean off levophed yesterday  Objective: Vital signs in last 24 hours: Temp:  [97.7 F (36.5 C)-98.8 F (37.1 C)] 98 F (36.7 C) (06/02 0749) Pulse Rate:  [76-107] 85 (06/02 0800) Resp:  [14-25] 20 (06/02 0800) BP: (106-132)/(48-64) 111/57 (06/02 0800) SpO2:  [99 %-100 %] 100 % (06/02 0800) FiO2 (%):  [40 %] 40 % (06/02 0339) Weight:  [122.3 kg  (269 lb 10 oz)] 122.3 kg (269 lb 10 oz) (06/02 0233) Last BM Date: 09/20/17  Intake/Output from previous day: 06/01 0701 - 06/02 0700 In: 3770.7 [I.V.:1300.7; NG/GT:2370; IV Piggyback:100] Out: 2840 [Urine:1340; Drains:850; Stool:650] Intake/Output this shift: Total I/O In: 10 [I.V.:10] Out: 80 [Urine:30; Drains:50]  Lungs: coarse, assisted  Cardiovascular: RRR  Abd: soft, vac in place with ABRA device in place  Extremities: trace edema  Neuro: intubated, sedated, moves extremities  Lab Results: CBC  Recent Labs    09/19/17 0317 09/20/17 0430  WBC 10.7* 10.8*  HGB 7.9* 8.6*  HCT 25.9* 27.9*  PLT 224 236   BMET Recent Labs    09/20/17 0430 09/21/17 0205  NA 141 142  K 3.9 3.8  CL 113* 116*  CO2 21* 22  GLUCOSE 118* 96  BUN 25* 23*  CREATININE 1.40* 1.30*  CALCIUM 7.2* 7.0*   PT/INR No results for input(s): LABPROT, INR in the last 72 hours. ABG No results for input(s): PHART, HCO3 in the last 72 hours.  Invalid input(s): PCO2, PO2  Studies/Results:  Anti-infectives: Anti-infectives (From admission, onward)   Start     Dose/Rate Route Frequency Ordered Stop   09/19/17 0730  ceFAZolin (ANCEF) IVPB 2g/100 mL premix     2 g 200 mL/hr over 30 Minutes Intravenous On call to O.R. 09/19/17 0715 09/19/17 1057   09/17/17 1145  ceFAZolin (ANCEF) 3 g in dextrose 5 % 50 mL IVPB     3 g 100 mL/hr over 30 Minutes Intravenous To ShortStay Surgical 09/17/17 0923 09/17/17 1449   09/07/17 1545  cefTRIAXone (ROCEPHIN) 2 g in sodium chloride 0.9 %  100 mL IVPB     2 g 200 mL/hr over 30 Minutes Intravenous Every 24 hours 09/07/17 1543 09/11/17 1605   09/04/17 1400  piperacillin-tazobactam (ZOSYN) IVPB 3.375 g  Status:  Discontinued     3.375 g 12.5 mL/hr over 240 Minutes Intravenous Every 8 hours 09/04/17 1157 09/07/17 1541   09/04/17 1100  ceFAZolin (ANCEF) IVPB 2g/100 mL premix  Status:  Discontinued     2 g 200 mL/hr over 30 Minutes Intravenous Every 8 hours  09/04/17 1035 09/04/17 1157   09/04/17 0630  piperacillin-tazobactam (ZOSYN) IVPB 3.375 g  Status:  Discontinued     3.375 g 100 mL/hr over 30 Minutes Intravenous  Once 09/04/17 0617 09/04/17 0626   09/04/17 0619  piperacillin-tazobactam (ZOSYN) IVPB 3.375 g     3.375 g 100 mL/hr over 30 Minutes Intravenous 60 min pre-op 09/04/17 0620 09/04/17 0717   09/04/17 0030  piperacillin-tazobactam (ZOSYN) IVPB 3.375 g     3.375 g 100 mL/hr over 30 Minutes Intravenous  Once 09/04/17 0024 09/04/17 0144      Medications: Scheduled Meds: . chlorhexidine gluconate (MEDLINE KIT)  15 mL Mouth Rinse BID  . docusate  100 mg Per Tube Daily  . feeding supplement (PRO-STAT SUGAR FREE 64)  30 mL Per Tube TID  . feeding supplement (VITAL HIGH PROTEIN)  1,000 mL Per Tube Q24H  . free water  350 mL Per Tube Q6H  . insulin aspart  0-15 Units Subcutaneous Q4H  . mouth rinse  15 mL Mouth Rinse 10 times per day  . sodium chloride flush  10-40 mL Intracatheter Q12H   Continuous Infusions: . sodium chloride 10 mL/hr at 09/21/17 0800  . famotidine (PEPCID) IV Stopped (09/20/17 2240)  . fentaNYL infusion INTRAVENOUS 50 mcg/hr (09/21/17 0816)  . magnesium sulfate 1 - 4 g bolus IVPB    . midazolam (VERSED) infusion 4 mg/hr (09/21/17 0815)  . norepinephrine (LEVOPHED) Adult infusion 2 mcg/min (09/20/17 0800)   PRN Meds:.acetaminophen **OR** acetaminophen, alum & mag hydroxide-simeth, bisacodyl, fentaNYL (SUBLIMAZE) injection, hydrALAZINE, ipratropium-albuterol, magnesium sulfate 1 - 4 g bolus IVPB, midazolam, ondansetron **OR** ondansetron (ZOFRAN) IV, phenol, pneumococcal 23 valent vaccine, sodium chloride flush  Mickeal Skinner, MD Pg# [phone] [facility]

## 2017-09-22 ENCOUNTER — Encounter (HOSPITAL_COMMUNITY): Payer: Self-pay | Admitting: Surgery

## 2017-09-22 DIAGNOSIS — J9601 Acute respiratory failure with hypoxia: Secondary | ICD-10-CM

## 2017-09-22 LAB — GLUCOSE, CAPILLARY
GLUCOSE-CAPILLARY: 106 mg/dL — AB (ref 65–99)
GLUCOSE-CAPILLARY: 106 mg/dL — AB (ref 65–99)
GLUCOSE-CAPILLARY: 115 mg/dL — AB (ref 65–99)
GLUCOSE-CAPILLARY: 120 mg/dL — AB (ref 65–99)
Glucose-Capillary: 87 mg/dL (ref 65–99)
Glucose-Capillary: 99 mg/dL (ref 65–99)
Glucose-Capillary: 108 mg/dL — ABNORMAL HIGH (ref 65–99)

## 2017-09-22 LAB — CBC
HEMATOCRIT: 25 % — AB (ref 39.0–52.0)
Hemoglobin: 7.6 g/dL — ABNORMAL LOW (ref 13.0–17.0)
MCH: 29.1 pg (ref 26.0–34.0)
MCHC: 30.4 g/dL (ref 30.0–36.0)
MCV: 95.8 fL (ref 78.0–100.0)
Platelets: 221 10*3/uL (ref 150–400)
RBC: 2.61 MIL/uL — ABNORMAL LOW (ref 4.22–5.81)
RDW: 14.7 % (ref 11.5–15.5)
WBC: 7.1 10*3/uL (ref 4.0–10.5)

## 2017-09-22 LAB — PHOSPHORUS: Phosphorus: 3 mg/dL (ref 2.5–4.6)

## 2017-09-22 LAB — COMPREHENSIVE METABOLIC PANEL
ALK PHOS: 67 U/L (ref 38–126)
ALT: 20 U/L (ref 17–63)
ANION GAP: 3 — AB (ref 5–15)
AST: 25 U/L (ref 15–41)
Albumin: 1.3 g/dL — ABNORMAL LOW (ref 3.5–5.0)
BUN: 21 mg/dL — AB (ref 6–20)
CALCIUM: 7.1 mg/dL — AB (ref 8.9–10.3)
CO2: 22 mmol/L (ref 22–32)
Chloride: 115 mmol/L — ABNORMAL HIGH (ref 101–111)
Creatinine, Ser: 1.34 mg/dL — ABNORMAL HIGH (ref 0.61–1.24)
GFR calc Af Amer: >60 mL/min (ref >60)
GFR calc non Af Amer: 53 mL/min — ABNORMAL LOW (ref >60)
Glucose, Bld: 110 mg/dL — ABNORMAL HIGH (ref 65–99)
POTASSIUM: 4.2 mmol/L (ref 3.5–5.1)
Sodium: 140 mmol/L (ref 135–145)
Total Bilirubin: 0.3 mg/dL (ref 0.3–1.2)
Total Protein: 4.5 g/dL — ABNORMAL LOW (ref 6.5–8.1)

## 2017-09-22 LAB — MAGNESIUM: MAGNESIUM: 2.1 mg/dL (ref 1.7–2.4)

## 2017-09-22 MED ORDER — VITAL HIGH PROTEIN PO LIQD
1000.0000 mL | ORAL | Status: DC
  Administered 2017-09-22 – 2017-09-28 (×7): 1000 mL

## 2017-09-22 MED ORDER — IPRATROPIUM-ALBUTEROL 0.5-2.5 (3) MG/3ML IN SOLN: 3.0000 mL | RESPIRATORY_TRACT | Status: DC | PRN

## 2017-09-22 MED ORDER — CHLORHEXIDINE GLUCONATE 0.12 % MT SOLN
OROMUCOSAL | Status: AC
  Filled 2017-09-22: qty 15

## 2017-09-22 NOTE — Progress Notes (Signed)
[facility]  Subjective  - sedated on vent   Objective (!) 111/50 95 99 F (37.2 C) (Oral) 20 100%  Intake/Output Summary (Last 24 hours) at 09/22/2017 1731 Last data filed at 09/22/2017 1600 Gross per 24 hour  Intake 3916 ml  Output 3045 ml  Net 871 ml   Ileostomy pink VAC on abdomen Feet pink warm  Assessment/Planning: POD #18 Aorto SMA bypass graft appears patent clinically Overall small improvement in clinical condition Will recheck next week Call if questions  Ruta Hinds 09/22/2017 5:31 PM --  Laboratory Lab Results: Recent Labs    09/20/17 0430 09/22/17 0341  WBC 10.8* 7.1  HGB 8.6* 7.6*  HCT 27.9* 25.0*  PLT 236 221   BMET Recent Labs    09/21/17 0205 09/22/17 0341  NA 142 140  K 3.8 4.2  CL 116* 115*  CO2 22 22  GLUCOSE 96 110*  BUN 23* 21*  CREATININE 1.30* 1.34*  CALCIUM 7.0* 7.1*    COAG Lab Results  Component Value Date   INR 1.60 09/04/2017   INR 1.20 08/09/2014   INR 1.02 05/16/2013   No results found for: PTT

## 2017-09-22 NOTE — Progress Notes (Signed)
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 Fascia remains open, agitation barrier to weaning. Trached 5/24., to return to OR 5/26 to place a wound closure device   STUDIES:  CT ab/pelvis 5/15 > portal venous gas in liver, sigmoid colon with diverticular disease, atrophic left kidney  CULTURES: 5/17 resp > e coli resistant to multiple antibiotics 5/17 blood >  negative  ANTIBIOTICS: 5/15 zosyn > 5/19  5/19 ceftriaxone > 5/23   LINES/TUBES: 5/16 R [place] CVL > 5/24 5/16 ETT > 5/24 5/24 Trach 5/18 R femoral arterial line > 5/24 5/24 LUA PICC   SIGNIFICANT EVENTS: 5/16 mesenteric angiogram and aorto to SMA bypass 5/16 R hemicolectomy 5/18 ileostomy 5/19 abdominal washout and partial fascial closure 5/21 TF started, hypoxia with turning  5/22 Return to OR for exploration, attempted closure > unable 5/24 Trach 5/26 OR for debridement and dressing change 5/29 Exploratory laparotomy, placement of ABBRA closure system 5/31 - No events overnight. Plans to return to OR for dressing change     SUBJECTIVE/OVERNIGHT/INTERVAL HX Remains sedated, off levophed. Wound still open and will be manipulated. On mechanical ventilation through tracheostomy. On fentanyl 400 and versed 4    VITAL SIGNS: BP 120/67 (BP Location: Right Arm)   Pulse (!) 112   Temp 98.9 F (37.2 C) (Oral)   Resp (!) 21   Ht 5\' 7"  (1.702 m)   Wt 120.6 kg (265 lb 14 oz)   SpO2 100%   BMI 41.64 kg/m   HEMODYNAMICS: CVP:  [4 mmHg-13 mmHg] 13 mmHg  VENTILATOR SETTINGS: Vent Mode: PRVC FiO2 (%):  [40 %] 40 % Set Rate:  [20 bmp] 20 bmp Vt  Set:  [530 mL] 530 mL PEEP:  [5 cmH20] 5 cmH20 Plateau Pressure:  [14 cmH20-20 cmH20] 14 cmH20  INTAKE / OUTPUT: I/O last 3 completed shifts: In: 5245.8 [I.V.:1881.7; NG/GT:3214.2; IV Piggyback:150] Out: 5462 [Urine:1562; Drains:1050; Stool:2850]  Physical exam General: This is a chronically ill-appearing 67 year old male he is currently sedated on full ventilator support HEENT normocephalic atraumatic.  Has a #6 tracheostomy which is unremarkable Pulmonary: Mechanically assisted ventilation clear to auscultation diminished in the bases Cardiac: Regular rate and rhythm on telemetry normal sinus. Abdomen: Open.  Wound VAC intact, tolerating tube feeds.  Ileostomy unremarkable. GU: Clear yellow Neuro: Heavily sedated Extremities: generalized edema   PULMONARY No results for input(s): PHART, PCO2ART, PO2ART, HCO3, TCO2, O2SAT in the last 168 hours.  Invalid input(s): PCO2, PO2  CBC Recent Labs  Lab 09/19/17 0317 09/20/17 0430 09/22/17 0341  HGB 7.9* 8.6* 7.6*  HCT 25.9* 27.9* 25.0*  WBC 10.7* 10.8* 7.1  PLT 224 236 221    COAGULATION No results for input(s): INR in the last 168 hours.  CARDIAC  No results for input(s): TROPONINI in the last 168 hours. No results for input(s): PROBNP in the last 168 hours.   CHEMISTRY Recent Labs  Lab 09/18/17 0455 09/19/17 0317 09/20/17 0430 09/21/17 0205 09/22/17 0341  NA 146* 144 141 142 140  K 4.3 3.9 3.9 3.8 4.2  CL 117* 117* 113* 116* 115*  CO2 24 23 21* 22 22  GLUCOSE  141* 122* 118* 96 110*  BUN 27* 29* 25* 23* 21*  CREATININE 1.52* 1.58* 1.40* 1.30* 1.34*  CALCIUM 7.2* 7.0* 7.2* 7.0* 7.1*  MG 2.1 2.1 2.1 1.9 2.1  PHOS  --  2.9 3.2 2.9 3.0   Estimated Creatinine Clearance: 66.5 mL/min (A) (by C-G formula based on SCr of 1.34 mg/dL (H)).   LIVER Recent Labs  Lab 09/16/17 0301 09/17/17 0440 09/18/17 0906 09/19/17 0317 09/22/17 0341  AST 33 26  --  22 25  ALT 24 22  --  13* 20  ALKPHOS 49 52  --  48 67   BILITOT 0.5 0.5  --  0.5 0.3  PROT 5.0* 4.6*  --  4.3* 4.5*  ALBUMIN 1.9* 1.7* 1.7* 1.5* 1.3*     INFECTIOUS No results for input(s): LATICACIDVEN, PROCALCITON in the last 168 hours.   ENDOCRINE CBG (last 3)  Recent Labs    09/21/17 2346 09/22/17 0337 09/22/17 0745  GLUCAP 106* 106* 120*     IMAGING x48h  - image(s) personally visualized  -   highlighted in bold Dg Chest Port 1 View  Result Date: 09/21/2017 CLINICAL DATA:  Respiratory failure EXAM: PORTABLE CHEST 1 VIEW COMPARISON:  September 20, 2017 FINDINGS: The tracheostomy tube and left PICC line are stable. The NG tube terminates in the stomach. No pneumothorax. Bilateral pleural effusions with underlying opacities are stable. Stable cardiomediastinal silhouette. Stable tracheostomy tube. IMPRESSION: 1. Stable support apparatus as above. 2. Stable bilateral pleural effusions with underlying opacities. Electronically Signed   By: Dorise Bullion III M.D   On: 09/21/2017 07:11     DISCUSSION: 67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 Fascia remains open, agitation barrier to weaning. Trached 5/24., to return to OR 5/26 to place a wound closure device again back to the OR on 5/31.  He is now getting daily manipulation of the Montgomery straps to try to encourage closure.  Looks like we have a long course of head of [place].  We will continue full ventilatory support, will need to coordinate with surgical services Re: Timing of weaning in regards to his abdomen for now we will continue sedation and supportive care.  ASSESSMENT / PLAN:   Active issues:  Ischemic bowel, s/p resection with hemicolectomy and ileostomy; now status post abdominal washout several times Plan Continuing wound VAC Continuing tube feeds which are at goal Eventually back to the OR for closure of abdominal cavity   Acute respiratory failure with hypoxemia with acute pulmonary edema  Status post  tracheostomy 5/24 H/O COPD Portable chest x-ray from yesterday personally reviewed: Right greater than left effusions and atelectasis no significant change when compared to prior film Plan Continuing full ventilator support PAD protocol RASS goal -2 Holding weaning his abdominal cavity still open Lasix X1   Hypotension.  Favor sedation related History of coronary artery disease, prior MI, hypertension and peripheral vascular disease Plan Now off levophed keep mean arterial pressure greater than 65 Continue telemetry monitoring Eventually add back aspirin, Lipitor, and Lopressor, hold this for now given hypotension   Acute kidney injury Creatinine continues to improve Plan Avoid hypotension Renal dose medications Strict intake output IV lasix for edema and pleural effusion  Follow-up a.m. Chemistry  Fluid and electrolyte imbalance: Hyperchloremia improving  -15.5 L positive since admit Plan Continuing free water (will back down a little on free water volume) Following a.m. chemistry  Anemia of critical illness -No evidence of active  bleeding currently Plan Trend CBC Transfuse for protocol if hemoglobin less than 7  Mild leukocytosis, no fevers Plan Trend CBC and fever curve  Hyperglycemia Has excellent glycemic control Plan Continuing sliding scale insulin   FAMILY  - Updates: no family at bedside. Discussed plan with bedside nurse   I have spent 32 mins of CC time bedside or in the unit exclusive of billable procedures patient is needing ICU due to respiratory failure requiring mechanical ventilation    09/22/2017 9:02 AM

## 2017-09-22 NOTE — Anesthesia Postprocedure Evaluation (Signed)
Anesthesia Post Note  Patient: Luke Kaufman  Procedure(s) Performed: EXPLORATORY LAPAROTOMY & DRESSING CHANGE (N/A Abdomen)     Anesthesia Type: General    Last Vitals:  Vitals:   09/22/17 0748 09/22/17 0800  BP:  120/67  Pulse:  (!) 112  Resp:  (!) 21  Temp: 37.2 C   SpO2:  100%    Last Pain:  Vitals:   09/22/17 0800  TempSrc:   PainSc: Asleep                 Lynda Rainwater

## 2017-09-22 NOTE — Progress Notes (Addendum)
Nutrition Follow-up  DOCUMENTATION CODES:   Obesity unspecified  INTERVENTION:  Increase TF to goal rate: Vital High Protein @ 55 ml/hr increase to 30 ml/hr + Prostat TID.  Total regimen provides: 1620 kcal, 160 grams protein, and 1103 ml free water; 100% of kcal and protein needs.   NUTRITION DIAGNOSIS:   Increased nutrient needs related to wound healing as evidenced by estimated needs.  Ongoing  GOAL:   Provide needs based on ASPEN/SCCM guidelines  Ongoing - progressing  MONITOR:   TF tolerance, Skin  REASON FOR ASSESSMENT:   Consult Enteral/tube feeding initiation and management  ASSESSMENT:   Pt with PMH of HTN, active smoker (2 PPD), CAD, MI, stroke, PAD who was admitted 5/15 with severe epigastric and periumbilical pain found to have ischemic R colon. Pt s/p ex lap with R hemicolectomy 5/16.   5/29 Exploratory laparotomy, placement of ABBRA closure system 5/31 - No events overnight. Plans to return to OR for dressing change  Pt abdomen still opened.  Pt on mechanical ventilation through trach collar. Off levophed, now on fentanyl.   Pt asleep, no family at bedside. Spoke with RN; pt tolerating TF well. Spoke with MD regarding increasing TF to goal rate; increase TF from 30 ml/hr to 55 ml/hr.   Medications reviewed: colace, PS TID, vital HP 30 ml/hr, free water 300 ml Q6H, novolog 0-15 units, pepcid, fentanyl, versed.   Labs reviewed: Cl 115 (H), BG 110 (H), BUN 21 (H), creatinine 1.34 (H), albumin 1.3 (L), GFR 53 (L), RBC 2.61 (L), hemoglobin 7.6 (L), HCT 25 (L).   Intake/Output Summary (Last 24 hours) at 09/22/2017 1450 Last data filed at 09/22/2017 1400 Gross per 24 hour  Intake 3614 ml  Output 4425 ml  Net -811 ml  150 ml output from wound vac.  Diet Order:   Diet Order           Diet NPO time specified  Diet effective midnight          EDUCATION NEEDS:   No education needs have been identified at this time  Skin:  Skin Assessment: Skin  Integrity Issues: Skin Integrity Issues:: Wound VAC Wound Vac: abd  Last BM:  6/3 - ileostomy (300 ml)  Height:   Ht Readings from Last 1 Encounters:  09/06/17 5\' 7"  (1.702 m)    Weight:   Wt Readings from Last 1 Encounters:  09/22/17 265 lb 14 oz (120.6 kg)    Ideal Body Weight:  67.2 kg  BMI:  Body mass index is 41.64 kg/m.  Estimated Nutritional Needs:   Kcal:  1400-1700  Protein:  145-168 grams  Fluid:  > 2 L/day   Hope Budds, Dietetic Intern

## 2017-09-22 NOTE — Op Note (Signed)
Preop: open abdomen  Post op: open abdomen  Procedure performed: placement of 100cm^2 negative pressure dressing  Procedure: The previous black sponge was taken down, chloroprep was applied to the skin surrounding the area. On evaluation of the abdomen, the ABRA device was above the fascia inferiorly and the intestine was seen at the level of the skin with some thin adhesions. Therefore, the superior ABRA tension strings were released to allow the superior abdomen to relax, as the tighten appeared to only shifted the visceral contents to the inferior wound with near eviseration. This allow the ABRA to be placed back beneath the fascia. A large black sponge vac was then fit to the space and suction was applied with good outcome. The patient tolerated the procedure well. Plan will be to return to the operating room for further investigation and closure of the abdomen.  Gurney Maxin, M.D. [facility] Pg: [phone]

## 2017-09-22 NOTE — Progress Notes (Signed)
Trach sutures removed per CCM order without any complications.

## 2017-09-23 ENCOUNTER — Encounter (HOSPITAL_COMMUNITY)
Admission: EM | Disposition: A | Discharge status: Nursing Home (Includes ICF) | Payer: Self-pay | Source: Home / Self Care | Attending: Pulmonary Disease

## 2017-09-23 ENCOUNTER — Inpatient Hospital Stay (HOSPITAL_COMMUNITY): Payer: PPO | Admitting: Anesthesiology

## 2017-09-23 ENCOUNTER — Inpatient Hospital Stay (HOSPITAL_COMMUNITY): Payer: PPO

## 2017-09-23 HISTORY — PX: LAPAROTOMY: SHX154

## 2017-09-23 HISTORY — PX: INSERTION OF MESH: SHX5868

## 2017-09-23 HISTORY — PX: APPLICATION OF WOUND VAC: SHX5189

## 2017-09-23 LAB — CBC WITH DIFFERENTIAL/PLATELET
ABS IMMATURE GRANULOCYTES: 0.1 10*3/uL (ref 0.0–0.1)
BASOS ABS: 0 10*3/uL (ref 0.0–0.1)
Basophils Relative: 0 %
Eosinophils Absolute: 0.1 10*3/uL (ref 0.0–0.7)
Eosinophils Relative: 1 %
HCT: 23.3 % — ABNORMAL LOW (ref 39.0–52.0)
HEMOGLOBIN: 7 g/dL — AB (ref 13.0–17.0)
Immature Granulocytes: 1 %
LYMPHS PCT: 13 %
Lymphs Abs: 1 10*3/uL (ref 0.7–4.0)
MCH: 28.3 pg (ref 26.0–34.0)
MCHC: 30 g/dL (ref 30.0–36.0)
MCV: 94.3 fL (ref 78.0–100.0)
MONO ABS: 0.9 10*3/uL (ref 0.1–1.0)
MONOS PCT: 11 %
NEUTROS ABS: 5.9 10*3/uL (ref 1.7–7.7)
Neutrophils Relative %: 74 %
Platelets: 223 10*3/uL (ref 150–400)
RBC: 2.47 MIL/uL — ABNORMAL LOW (ref 4.22–5.81)
RDW: 14.8 % (ref 11.5–15.5)
WBC: 8 10*3/uL (ref 4.0–10.5)

## 2017-09-23 LAB — GLUCOSE, CAPILLARY
GLUCOSE-CAPILLARY: 101 mg/dL — AB (ref 65–99)
GLUCOSE-CAPILLARY: 105 mg/dL — AB (ref 65–99)
GLUCOSE-CAPILLARY: 109 mg/dL — AB (ref 65–99)
GLUCOSE-CAPILLARY: 126 mg/dL — AB (ref 65–99)
Glucose-Capillary: 103 mg/dL — ABNORMAL HIGH (ref 65–99)
Glucose-Capillary: 122 mg/dL — ABNORMAL HIGH (ref 65–99)
Glucose-Capillary: 135 mg/dL — ABNORMAL HIGH (ref 65–99)

## 2017-09-23 LAB — MAGNESIUM: Magnesium: 1.9 mg/dL (ref 1.7–2.4)

## 2017-09-23 LAB — BASIC METABOLIC PANEL
Anion gap: 6 (ref 5–15)
BUN: 26 mg/dL — AB (ref 6–20)
CHLORIDE: 112 mmol/L — AB (ref 101–111)
CO2: 21 mmol/L — ABNORMAL LOW (ref 22–32)
Calcium: 7.3 mg/dL — ABNORMAL LOW (ref 8.9–10.3)
Creatinine, Ser: 1.32 mg/dL — ABNORMAL HIGH (ref 0.61–1.24)
GFR calc Af Amer: >60 mL/min (ref >60)
GFR calc non Af Amer: 54 mL/min — ABNORMAL LOW (ref >60)
Glucose, Bld: 113 mg/dL — ABNORMAL HIGH (ref 65–99)
POTASSIUM: 4.4 mmol/L (ref 3.5–5.1)
Sodium: 139 mmol/L (ref 135–145)

## 2017-09-23 LAB — PREPARE RBC (CROSSMATCH)

## 2017-09-23 LAB — PHOSPHORUS: Phosphorus: 3.2 mg/dL (ref 2.5–4.6)

## 2017-09-23 SURGERY — LAPAROTOMY, EXPLORATORY
Anesthesia: General | Site: Abdomen

## 2017-09-23 MED ORDER — PROPOFOL 10 MG/ML IV BOLUS
INTRAVENOUS | Status: DC | PRN
  Administered 2017-09-23: 60 mg via INTRAVENOUS

## 2017-09-23 MED ORDER — ROCURONIUM BROMIDE 10 MG/ML (PF) SYRINGE
PREFILLED_SYRINGE | INTRAVENOUS | Status: DC | PRN
  Administered 2017-09-23: 50 mg via INTRAVENOUS
  Administered 2017-09-23: 100 mg via INTRAVENOUS
  Administered 2017-09-23: 40 mg via INTRAVENOUS
  Administered 2017-09-23: 50 mg via INTRAVENOUS
  Administered 2017-09-23: 60 mg via INTRAVENOUS

## 2017-09-23 MED ORDER — DEXTROSE 50 % IV SOLN
INTRAVENOUS | Status: AC
  Filled 2017-09-23: qty 50

## 2017-09-23 MED ORDER — PHENYLEPHRINE HCL 10 MG/ML IJ SOLN
INTRAVENOUS | Status: DC | PRN
  Administered 2017-09-23: 45 ug/min via INTRAVENOUS

## 2017-09-23 MED ORDER — DEXTROSE 5 % IV SOLN
INTRAVENOUS | Status: DC | PRN
  Administered 2017-09-23: 3 g via INTRAVENOUS

## 2017-09-23 MED ORDER — PHENYLEPHRINE HCL 10 MG/ML IJ SOLN
INTRAMUSCULAR | Status: DC | PRN
  Administered 2017-09-23 (×3): 120 ug via INTRAVENOUS

## 2017-09-23 MED ORDER — ALBUMIN HUMAN 5 % IV SOLN
INTRAVENOUS | Status: DC | PRN
  Administered 2017-09-23: 16:00:00 via INTRAVENOUS

## 2017-09-23 MED ORDER — ROCURONIUM BROMIDE 10 MG/ML (PF) SYRINGE
PREFILLED_SYRINGE | INTRAVENOUS | Status: AC
  Filled 2017-09-23: qty 10

## 2017-09-23 MED ORDER — SODIUM CHLORIDE 0.9 % IV SOLN: Freq: Once | INTRAVENOUS | Status: DC

## 2017-09-23 MED ORDER — FREE WATER
250.0000 mL | Freq: Four times a day (QID) | Status: DC
  Administered 2017-09-23 – 2017-09-30 (×25): 250 mL

## 2017-09-23 MED ORDER — 0.9 % SODIUM CHLORIDE (POUR BTL) OPTIME
TOPICAL | Status: DC | PRN
  Administered 2017-09-23 (×2): 1000 mL

## 2017-09-23 MED ORDER — FENTANYL CITRATE (PF) 250 MCG/5ML IJ SOLN
INTRAMUSCULAR | Status: AC
  Filled 2017-09-23: qty 5

## 2017-09-23 MED ORDER — LACTATED RINGERS IV SOLN
INTRAVENOUS | Status: DC | PRN
  Administered 2017-09-23: 15:00:00 via INTRAVENOUS

## 2017-09-23 SURGICAL SUPPLY — 47 items
BENZOIN TINCTURE PRP APPL 2/3 (GAUZE/BANDAGES/DRESSINGS) ×6 IMPLANT
CANISTER WOUND CARE 500ML ATS (WOUND CARE) ×3 IMPLANT
CHLORAPREP W/TINT 26ML (MISCELLANEOUS) ×3 IMPLANT
CLIP VESOCCLUDE LG 6/CT (CLIP) ×3 IMPLANT
CLIP VESOCCLUDE MED 6/CT (CLIP) ×3 IMPLANT
CLIP VESOCCLUDE SM WIDE 6/CT (CLIP) ×3 IMPLANT
COVER SURGICAL LIGHT HANDLE (MISCELLANEOUS) ×3 IMPLANT
DRAPE LAPAROSCOPIC ABDOMINAL (DRAPES) ×3 IMPLANT
DRAPE WARM FLUID 44X44 (DRAPE) ×3 IMPLANT
DRSG OPSITE POSTOP 4X10 (GAUZE/BANDAGES/DRESSINGS) IMPLANT
DRSG OPSITE POSTOP 4X8 (GAUZE/BANDAGES/DRESSINGS) IMPLANT
DRSG VAC ATS LRG SENSATRAC (GAUZE/BANDAGES/DRESSINGS) ×6 IMPLANT
ELECT BLADE 6.5 EXT (BLADE) IMPLANT
ELECT CAUTERY BLADE 6.4 (BLADE) ×3 IMPLANT
ELECT REM PT RETURN 9FT ADLT (ELECTROSURGICAL) ×3
ELECTRODE REM PT RTRN 9FT ADLT (ELECTROSURGICAL) ×1 IMPLANT
GLOVE BIOGEL PI IND STRL 7.0 (GLOVE) ×1 IMPLANT
GLOVE BIOGEL PI IND STRL 7.5 (GLOVE) ×3 IMPLANT
GLOVE BIOGEL PI INDICATOR 7.0 (GLOVE) ×2
GLOVE BIOGEL PI INDICATOR 7.5 (GLOVE) ×6
GLOVE SURG SS PI 7.0 STRL IVOR (GLOVE) ×3 IMPLANT
GOWN STRL REUS W/ TWL LRG LVL3 (GOWN DISPOSABLE) ×2 IMPLANT
GOWN STRL REUS W/TWL LRG LVL3 (GOWN DISPOSABLE) ×4
KIT BASIN OR (CUSTOM PROCEDURE TRAY) ×3 IMPLANT
KIT OSTOMY DRAINABLE 2.75 STR (WOUND CARE) ×3 IMPLANT
LIGASURE IMPACT 36 18CM CVD LR (INSTRUMENTS) IMPLANT
LOOP VESSEL MAXI BLUE (MISCELLANEOUS) IMPLANT
LOOP VESSEL MINI RED (MISCELLANEOUS) IMPLANT
MESH PHASIX ST 25X30 (Mesh General) ×3 IMPLANT
NEEDLE 22X1 1/2 (OR ONLY) (NEEDLE) ×3 IMPLANT
PACK GENERAL/GYN (CUSTOM PROCEDURE TRAY) ×3 IMPLANT
RETAINER VISCERA MED (MISCELLANEOUS) ×3 IMPLANT
SPONGE LAP 18X18 X RAY DECT (DISPOSABLE) IMPLANT
STAPLER VISISTAT 35W (STAPLE) ×3 IMPLANT
SUCTION POOLE TIP (SUCTIONS) ×3 IMPLANT
SUT ETHILON 2 0 FS 18 (SUTURE) ×6 IMPLANT
SUT PDS AB 0 CT 36 (SUTURE) ×30 IMPLANT
SUT PDS AB 1 TP1 96 (SUTURE) IMPLANT
SUT SILK 2 0 (SUTURE) ×2
SUT SILK 2 0 SH CR/8 (SUTURE) ×3 IMPLANT
SUT SILK 2-0 18XBRD TIE 12 (SUTURE) ×1 IMPLANT
SUT SILK 3 0 (SUTURE) ×2
SUT SILK 3 0 SH CR/8 (SUTURE) ×3 IMPLANT
SUT SILK 3-0 18XBRD TIE 12 (SUTURE) ×1 IMPLANT
TOWEL OR 17X26 10 PK STRL BLUE (TOWEL DISPOSABLE) ×3 IMPLANT
TRAY FOLEY MTR SLVR 16FR STAT (SET/KITS/TRAYS/PACK) ×3 IMPLANT
YANKAUER SUCT BULB TIP NO VENT (SUCTIONS) IMPLANT

## 2017-09-23 NOTE — Progress Notes (Signed)
[facility] Progress Note Patient Name: Luke Kaufman DOB: [birth year] MRN: [id number]   Date of Service  09/23/2017  HPI/Events of Note  Anemia - Hgb = 7.0.  eICU Interventions  Will transfuse 1 unit PRBC.     Intervention Category Major Interventions: Other:  Shaylee Stanislawski [name] 09/23/2017, 6:15 AM

## 2017-09-23 NOTE — Op Note (Signed)
Preoperative diagnosis: open abdomen, mesenteric ischemia, loss of domain  Postoperative diagnosis: same   Procedure: reopening of recent laparotomy, incisional hernia repair, insertion of mesh, placement of negative pressure dressing of 450cm^2  Surgeon: Gurney Maxin, M.D.  Asst: Georganna Skeans  Anesthesia: General  Indications for procedure: Luke Kaufman is a 66 y.o. year old male with open abdomen. He had an ABRA device in place but the lower portion lost its elastiomers and led to evisceration. Due to this decision was made to bridge the defect and allow further recovery.  Description of procedure: The patient was brought into the operative suite. Anesthesia was administered with General endotracheal anesthesia. WHO checklist was applied.  The elastomers were removed including all portions of the device.  The patient was then placed in supine position. The area was prepped and draped in the usual sterile fashion.  Next, the [facility] was taken down and the ABRA device removed.  On evaluation of the intestines they were dilated and well adhesed together.  The fascia and subcutaneous tissue was partially granulating and edematous.  Defects between fascial layers was 30 cm from superior to inferior and 20 cm from left to right.  Decision was made to use a Phasix ST bridge mesh.  For 0 PDS was used and you interrupted fashion to suture the Phasix mesh to the fascia 360 degrees.  A fish and a malleable were used to avoid injury to the intestine.  After this was complete, a 2-0 nylon was used to close the inferior portion of the skin by 3 cm using a vertical interrupted mattress technique.  Next, a black sponge was used as a dressing directly over the Phasix mesh.  The sponge was then connected to continuous negative pressure.  Patient tolerated procedure well.  Ileostomy device was put back in place.  Patient was brought back to the ICU in critical condition  Findings: 30 x 20 cm fascial defect.   At end of case phasic's bridge this Well.  2 large black sponges required for dressing.  Specimen: None  Implant: Phasix ST to the mesh  Blood loss: 30 mL  Local anesthesia: none  Complications: none  Gurney Maxin, M.D. General, Bariatric, & Minimally Invasive Surgery [facility]

## 2017-09-23 NOTE — Transfer of Care (Signed)
Immediate Anesthesia Transfer of Care Note  Patient: Luke Kaufman  Procedure(s) Performed: EXPLORATORY LAPAROTOMY, INCISIONAL HERNIA REPAIR WITH MESH INSERTION, PARTIAL CLOSURE OF SKIN (N/A Abdomen) INSERTION OF MESH (N/A Abdomen) APPLICATION OF NEGATIVE PRESSURE THERAPY (N/A Abdomen)  Patient Location: ICU  Anesthesia Type:General  Level of Consciousness: sedated, comatose and Patient remains intubated per anesthesia plan  Airway & Oxygen Therapy: Patient remains intubated per anesthesia plan and Patient placed on Ventilator (see vital sign flow sheet for setting)  Post-op Assessment: Report given to RN and Post -op Vital signs reviewed and stable  Post vital signs: Reviewed and stable  Last Vitals:  Vitals Value Taken Time  BP    Temp    Pulse    Resp    SpO2      Last Pain:  Vitals:   09/23/17 1200  TempSrc: Oral  PainSc:          Complications: No apparent anesthesia complications

## 2017-09-23 NOTE — Anesthesia Procedure Notes (Signed)
Date/Time: 09/23/2017 2:30 PM Performed by: Lance Coon, CRNA Pre-anesthesia Checklist: Patient identified, Emergency Drugs available, Suction available, Patient being monitored and Timeout performed Patient Re-evaluated:Patient Re-evaluated prior to induction Oxygen Delivery Method: Circle system utilized Preoxygenation: Pre-oxygenation with 100% oxygen Induction Type: Inhalational induction Comments: Trach indwelling upon arrival to OR, +BBS, + ETCO2

## 2017-09-23 NOTE — Progress Notes (Signed)
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 Fascia remains open, agitation barrier to weaning. Trached 5/24., to return to OR 5/26 to place a wound closure device   STUDIES:  CT ab/pelvis 5/15 > portal venous gas in liver, sigmoid colon with diverticular disease, atrophic left kidney  CULTURES: 5/17 resp > e coli resistant to multiple antibiotics 5/17 blood >  negative  ANTIBIOTICS: 5/15 zosyn > 5/19  5/19 ceftriaxone > 5/23   LINES/TUBES: 5/16 R  CVL > 5/24 5/16 ETT > 5/24 5/24 Trach 5/18 R femoral arterial line > 5/24 5/24 LUA PICC   SIGNIFICANT EVENTS: 5/16 mesenteric angiogram and aorto to SMA bypass 5/16 R hemicolectomy 5/18 ileostomy 5/19 abdominal washout and partial fascial closure 5/21 TF started, hypoxia with turning  5/22 Return to OR for exploration, attempted closure > unable 5/24 Trach 5/26 OR for debridement and dressing change 5/29 Exploratory laparotomy, placement of ABBRA closure system 5/31 - No events overnight. Plans to return to OR for dressing change     SUBJECTIVE/OVERNIGHT/INTERVAL HX Sedated   VITAL SIGNS: BP (!) 98/48   Pulse 89   Temp 100.2 F (37.9 C) (Oral)   Resp 20   Ht 5\' 7"  (1.702 m)   Wt 120.3 kg (265 lb 3.4 oz)   SpO2 100%   BMI 41.54 kg/m   HEMODYNAMICS: CVP:  [9 mmHg-12 mmHg] 9 mmHg  VENTILATOR SETTINGS: Vent Mode: PRVC FiO2 (%):  [40 %] 40 % Set Rate:  [20 bmp] 20 bmp Vt Set:  [530 mL] 530 mL PEEP:  [5 cmH20] 5 cmH20 Plateau Pressure:  [13 cmH20-15 cmH20] 15 cmH20  INTAKE / OUTPUT: I/O last 3 completed shifts: In: 5591.3 [I.V.:1968;  FA/OZ:3086.5; IV Piggyback:150] Out: 7846 [Urine:1850; Drains:1050; Stool:1425]  Physical exam General: Appears older than stated age 47: Endotracheal and NG tube in place, tube feeds currently on hold Neuro: Sedated CV: Heart sounds are regular normal sinus rhythm PULM: Coarse rhonchi bilaterally decreased in bases GI: Open abdomen wound VAC in place Extremities: warm/dry, 1+ edema  Skin: no rashes or lesions   PULMONARY No results for input(s): PHART, PCO2ART, PO2ART, HCO3, TCO2, O2SAT in the last 168 hours.  Invalid input(s): PCO2, PO2  CBC Recent Labs  Lab 09/20/17 0430 09/22/17 0341 09/23/17 0336  HGB 8.6* 7.6* 7.0*  HCT 27.9* 25.0* 23.3*  WBC 10.8* 7.1 8.0  PLT 236 221 223    COAGULATION No results for input(s): INR in the last 168 hours.  CARDIAC  No results for input(s): TROPONINI in the last 168 hours. No results for input(s): PROBNP in the last 168 hours.   CHEMISTRY Recent Labs  Lab 09/19/17 0317 09/20/17 0430 09/21/17 0205 09/22/17 0341 09/23/17 0336  NA 144 141 142 140 139  K 3.9 3.9 3.8 4.2 4.4  CL 117* 113* 116* 115* 112*  CO2 23 21* 22 22 21*  GLUCOSE 122* 118* 96 110* 113*  BUN 29* 25* 23* 21* 26*  CREATININE 1.58* 1.40* 1.30* 1.34* 1.32*  CALCIUM 7.0* 7.2* 7.0* 7.1* 7.3*  MG 2.1 2.1 1.9 2.1 1.9  PHOS 2.9 3.2 2.9 3.0 3.2   Estimated  Creatinine Clearance: 67.4 mL/min (A) (by C-G formula based on SCr of 1.32 mg/dL (H)).   LIVER Recent Labs  Lab 09/17/17 0440 09/18/17 0906 09/19/17 0317 09/22/17 0341  AST 26  --  22 25  ALT 22  --  13* 20  ALKPHOS 52  --  48 67  BILITOT 0.5  --  0.5 0.3  PROT 4.6*  --  4.3* 4.5*  ALBUMIN 1.7* 1.7* 1.5* 1.3*     INFECTIOUS No results for input(s): LATICACIDVEN, PROCALCITON in the last 168 hours.   ENDOCRINE CBG (last 3)  Recent Labs    09/22/17 1952 09/23/17 0340 09/23/17 0752  GLUCAP 108* 109* 126*     IMAGING x48h  - image(s) personally visualized  -   highlighted in  bold Dg Chest Port 1 View  Result Date: 09/23/2017 CLINICAL DATA:  Tracheostomy.  Respiratory failure. EXAM: PORTABLE CHEST 1 VIEW FINDINGS: The heart is enlarged. There is no consolidation or edema. Small RIGHT effusion is stable. Tracheostomy tube good position. Nasogastric tube is coiled in the stomach. IMPRESSION: Stable chest. Electronically Signed   By: Staci Righter M.D.   On: 09/23/2017 07:39     DISCUSSION: 67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 Fascia remains open, agitation barrier to weaning. Trached 5/24., to return to OR 5/26 to place a wound closure device again back to the OR on 5/31.  He is now getting daily manipulation of the [place] straps to try to encourage closure.  Looks like we have a long course of head of [place].  We will continue full ventilatory support, will need to coordinate with surgical services Re: Timing of weaning in regards to his abdomen for now we will continue sedation and supportive care.  ASSESSMENT / PLAN:   Active issues:  Ischemic bowel, s/p resection with hemicolectomy and ileostomy; now status post abdominal washout several times Plan Care per surgery Currently off tube feeding Wound per surgery   Acute respiratory failure with hypoxemia with acute pulmonary edema  Status post tracheostomy 5/24 H/O COPD  Plan Continue full ventilator support Bronchodilators as needed Chest x-ray  Hypotension.  Favor sedation related History of coronary artery disease, prior MI, hypertension and peripheral vascular disease Plan Off Levophed Hold antihypertensives at this time   Acute kidney injury Lab Results  Component Value Date   CREATININE 1.32 (H) 09/23/2017   CREATININE 1.34 (H) 09/22/2017   CREATININE 1.30 (H) 09/21/2017   Recent Labs  Lab 09/21/17 0205 09/22/17 0341 09/23/17 0336  K 3.8 4.2 4.4    Plan Avoid hypotension Renal dose medications Strict intake  output IV lasix for edema and pleural effusion  Follow-up a.m. Chemistry  Fluid and electrolyte imbalance: Hyperchloremia improving  -15.5 L positive since admit Recent Labs  Lab 09/21/17 0205 09/22/17 0341 09/23/17 0336  NA 142 140 139  Chloride and 112 09/23/2017  Plan Continue free water Chemistry daily  Anemia of critical illness Recent Labs    09/22/17 0341 09/23/17 0336  HGB 7.6* 7.0*    -No evidence of active bleeding currently Plan Trend CBC Infused per protocol  Mild leukocytosis, no fevers Plan Trend CBC and fever curve  Hyperglycemia CBG (last 3)  Recent Labs    09/22/17 1952 09/23/17 0340 09/23/17 0752  GLUCAP 108* 109* 126*    Has excellent glycemic control Plan Sliding scale insulin   FAMILY  - Updates: 09/23/2017 no family at bedside  Critical care time 18  minutes  Richardson Landry Minor ACNP Maryanna Shape PCCM Pager [phone] till 1 pm If no answer page [phone] 09/23/2017, 8:24 AM

## 2017-09-23 NOTE — Progress Notes (Signed)
[facility]/Trauma Progress Note  4 Days Post-Op   Assessment/Plan Principal Problem:   Mesenteric ischemia (HCC) Active Problems:   Essential hypertension   Arterial atherosclerosis   HLD (hyperlipidemia)   Abdominal pain   PUD (peptic ulcer disease)   Occlusion of superior mesenteric artery (HCC)   Respiratory failure (HCC)   Acute respiratory failure with hypoxemia (HCC)  Mesenteric ischemia 1.  Exploratory laparotomy right hemicolectomy 09/04/2017 Dr. Stark Klein 2.  Abdominal exploration, application of temporary abdominal closure device (ABTHERA), 09/04/2017 Dr. Nadeen Landau 3.  Oratory laparotomy, ileostomy, application of open abdominal wound VAC, 09/06/2017 Dr. Georganna Skeans 4.  Open abdomen for attempted closure, 09/08/2017 Dr. Judeth Horn 5.  Oratory laparotomy with possible wound closure(prior sutures upper & lower portion of the fascia had broken), 09/10/2017 Dr. Judeth Horn 6.  Reexploration open abdominal wound, application of ABTHERAWOUND VAC, 09/14/2017 Dr. Greer Pickerel 7. Exploratory laparotomy and partial closure of abdomen using the elastic tension closure device (ABRA), Dr. Hulen Skains, 05/29 8. exploratory laparotomy and dressing change, Dr. Ninfa Linden, 05/31 9. Changed negative pressure dressing, Dr. Kieth Brightly, 06/03   FEN: holding tube feeds VTE: SCD's ID: none currently Foley: yes Follow up: TBD  DISPO: OR today for exploratory laparotomy and possible closure    LOS: 19 days    Subjective: CC: open abdomen  Pt is sedated and on the ventilator.   Objective: Vital signs in last 24 hours: Temp:  [97.6 F (36.4 C)-99.7 F (37.6 C)] 99.6 F (37.6 C) (06/04 0342) Pulse Rate:  [69-112] 89 (06/04 0700) Resp:  [19-24] 20 (06/04 0700) BP: (87-120)/(45-67) 98/48 (06/04 0700) SpO2:  [97 %-100 %] 100 % (06/04 0700) FiO2 (%):  [40 %] 40 % (06/04 0346) Weight:  [120.3 kg (265 lb 3.4 oz)] 120.3 kg (265 lb 3.4 oz) (06/04 0421) Last BM Date:  09/22/17  Intake/Output from previous day: 06/03 0701 - 06/04 0700 In: 3714.3 [I.V.:1296; NG/GT:2268.3; IV Piggyback:150] Out: 2555 [Urine:1280; Drains:800; Stool:475] Intake/Output this shift: No intake/output data recorded.  PE: Gen:  On vent and sedated Abd: wound vac in place, ostomy pink and brown liquid stool in bag Skin:  warm and dry   Anti-infectives: Anti-infectives (From admission, onward)   Start     Dose/Rate Route Frequency Ordered Stop   09/19/17 0730  ceFAZolin (ANCEF) IVPB 2g/100 mL premix     2 g 200 mL/hr over 30 Minutes Intravenous On call to O.R. 09/19/17 0715 09/19/17 1057   09/17/17 1145  ceFAZolin (ANCEF) 3 g in dextrose 5 % 50 mL IVPB     3 g 100 mL/hr over 30 Minutes Intravenous To ShortStay Surgical 09/17/17 0923 09/17/17 1449   09/07/17 1545  cefTRIAXone (ROCEPHIN) 2 g in sodium chloride 0.9 % 100 mL IVPB     2 g 200 mL/hr over 30 Minutes Intravenous Every 24 hours 09/07/17 1543 09/11/17 1605   09/04/17 1400  piperacillin-tazobactam (ZOSYN) IVPB 3.375 g  Status:  Discontinued     3.375 g 12.5 mL/hr over 240 Minutes Intravenous Every 8 hours 09/04/17 1157 09/07/17 1541   09/04/17 1100  ceFAZolin (ANCEF) IVPB 2g/100 mL premix  Status:  Discontinued     2 g 200 mL/hr over 30 Minutes Intravenous Every 8 hours 09/04/17 1035 09/04/17 1157   09/04/17 0630  piperacillin-tazobactam (ZOSYN) IVPB 3.375 g  Status:  Discontinued     3.375 g 100 mL/hr over 30 Minutes Intravenous  Once 09/04/17 0617 09/04/17 0626   09/04/17 0619  piperacillin-tazobactam (ZOSYN) IVPB 3.375  g     3.375 g 100 mL/hr over 30 Minutes Intravenous 60 min pre-op 09/04/17 0620 09/04/17 0717   09/04/17 0030  piperacillin-tazobactam (ZOSYN) IVPB 3.375 g     3.375 g 100 mL/hr over 30 Minutes Intravenous  Once 09/04/17 0024 09/04/17 0144      Lab Results:  Recent Labs    09/22/17 0341 09/23/17 0336  WBC 7.1 8.0  HGB 7.6* 7.0*  HCT 25.0* 23.3*  PLT 221 223   BMET Recent Labs     09/22/17 0341 09/23/17 0336  NA 140 139  K 4.2 4.4  CL 115* 112*  CO2 22 21*  GLUCOSE 110* 113*  BUN 21* 26*  CREATININE 1.34* 1.32*  CALCIUM 7.1* 7.3*   PT/INR No results for input(s): LABPROT, INR in the last 72 hours. CMP     Component Value Date/Time   NA 139 09/23/2017 0336   K 4.4 09/23/2017 0336   CL 112 (H) 09/23/2017 0336   CO2 21 (L) 09/23/2017 0336   GLUCOSE 113 (H) 09/23/2017 0336   BUN 26 (H) 09/23/2017 0336   CREATININE 1.32 (H) 09/23/2017 0336   CALCIUM 7.3 (L) 09/23/2017 0336   PROT 4.5 (L) 09/22/2017 0341   ALBUMIN 1.3 (L) 09/22/2017 0341   AST 25 09/22/2017 0341   ALT 20 09/22/2017 0341   ALKPHOS 67 09/22/2017 0341   BILITOT 0.3 09/22/2017 0341   GFRNONAA 54 (L) 09/23/2017 0336   GFRAA >60 09/23/2017 0336   Lipase     Component Value Date/Time   LIPASE 37 09/03/2017 1644    Studies/Results: No results found.    Kalman Drape , [facility] 09/23/2017, 7:40 AM  Pager: [phone] Mon-Wed, Friday 7:00am-4:30pm Thurs 7am-11:30am  Consults: [phone]

## 2017-09-23 NOTE — Progress Notes (Signed)
RT note-Patient taken to OR by anesthesia.

## 2017-09-24 ENCOUNTER — Encounter (HOSPITAL_COMMUNITY): Payer: Self-pay | Admitting: General Surgery

## 2017-09-24 ENCOUNTER — Inpatient Hospital Stay (HOSPITAL_COMMUNITY): Payer: PPO

## 2017-09-24 DIAGNOSIS — N179 Acute kidney failure, unspecified: Secondary | ICD-10-CM

## 2017-09-24 LAB — BASIC METABOLIC PANEL
Anion gap: 6 (ref 5–15)
BUN: 23 mg/dL — AB (ref 6–20)
CO2: 18 mmol/L — ABNORMAL LOW (ref 22–32)
CREATININE: 1.37 mg/dL — AB (ref 0.61–1.24)
Calcium: 6.9 mg/dL — ABNORMAL LOW (ref 8.9–10.3)
Chloride: 116 mmol/L — ABNORMAL HIGH (ref 101–111)
GFR calc Af Amer: >60 mL/min (ref >60)
GFR, EST NON AFRICAN AMERICAN: 52 mL/min — AB (ref >60)
Glucose, Bld: 110 mg/dL — ABNORMAL HIGH (ref 65–99)
POTASSIUM: 4.3 mmol/L (ref 3.5–5.1)
SODIUM: 140 mmol/L (ref 135–145)

## 2017-09-24 LAB — BPAM RBC
BLOOD PRODUCT EXPIRATION DATE: 201906262359
ISSUE DATE / TIME: 201906040921
Unit Type and Rh: 5100

## 2017-09-24 LAB — GLUCOSE, CAPILLARY
Glucose-Capillary: 115 mg/dL — ABNORMAL HIGH (ref 65–99)
Glucose-Capillary: 136 mg/dL — ABNORMAL HIGH (ref 65–99)
Glucose-Capillary: 105 mg/dL — ABNORMAL HIGH (ref 65–99)
Glucose-Capillary: 105 mg/dL — ABNORMAL HIGH (ref 65–99)
Glucose-Capillary: 74 mg/dL (ref 65–99)
Glucose-Capillary: 134 mg/dL — ABNORMAL HIGH (ref 65–99)

## 2017-09-24 LAB — TYPE AND SCREEN
ABO/RH(D): O POS
ANTIBODY SCREEN: NEGATIVE
Unit division: 0

## 2017-09-24 LAB — CBC WITH DIFFERENTIAL/PLATELET
BASOS ABS: 0 10*3/uL (ref 0.0–0.1)
Basophils Relative: 0 %
Eosinophils Absolute: 0.1 10*3/uL (ref 0.0–0.7)
Eosinophils Relative: 1 %
HCT: 28.9 % — ABNORMAL LOW (ref 39.0–52.0)
Hemoglobin: 8.9 g/dL — ABNORMAL LOW (ref 13.0–17.0)
LYMPHS ABS: 0.9 10*3/uL (ref 0.7–4.0)
Lymphocytes Relative: 10 %
MCH: 28.9 pg (ref 26.0–34.0)
MCHC: 30.8 g/dL (ref 30.0–36.0)
MCV: 93.8 fL (ref 78.0–100.0)
MONO ABS: 0.9 10*3/uL (ref 0.1–1.0)
Monocytes Relative: 11 %
Neutro Abs: 6.6 10*3/uL (ref 1.7–7.7)
Neutrophils Relative %: 78 %
PLATELETS: 256 10*3/uL (ref 150–400)
RBC: 3.08 MIL/uL — AB (ref 4.22–5.81)
RDW: 14.9 % (ref 11.5–15.5)
WBC Morphology: INCREASED
WBC: 8.5 10*3/uL (ref 4.0–10.5)

## 2017-09-24 LAB — PHOSPHORUS: Phosphorus: 2.9 mg/dL (ref 2.5–4.6)

## 2017-09-24 LAB — MAGNESIUM: Magnesium: 1.8 mg/dL (ref 1.7–2.4)

## 2017-09-24 MED ORDER — LACTATED RINGERS IV BOLUS
500.0000 mL | Freq: Once | INTRAVENOUS | Status: AC
  Administered 2017-09-24: 500 mL via INTRAVENOUS

## 2017-09-24 NOTE — Anesthesia Postprocedure Evaluation (Signed)
Anesthesia Post Note  Patient: Luke Kaufman  Procedure(s) Performed: EXPLORATORY LAPAROTOMY, INCISIONAL HERNIA REPAIR WITH MESH INSERTION, PARTIAL CLOSURE OF SKIN (N/A Abdomen) INSERTION OF MESH (N/A Abdomen) APPLICATION OF NEGATIVE PRESSURE THERAPY (N/A Abdomen)     Patient location during evaluation: SICU Anesthesia Type: General Level of consciousness: sedated Pain management: pain level controlled Vital Signs Assessment: post-procedure vital signs reviewed and stable Respiratory status: patient remains intubated per anesthesia plan and patient on ventilator - see flowsheet for VS Cardiovascular status: stable Postop Assessment: no apparent nausea or vomiting Anesthetic complications: no    Last Vitals:  Vitals:   09/24/17 0545 09/24/17 0600  BP: (!) 106/50 (!) 105/51  Pulse:    Resp: (!) 24 (!) 26  Temp: 37.7 C   SpO2: 100% 100%    Last Pain:  Vitals:   09/24/17 0545  TempSrc: Oral  PainSc:                  Pinki Rottman COKER

## 2017-09-24 NOTE — Consult Note (Signed)
[place] Nurse ostomy follow up Stoma type/location: RLQ Stomal assessment/size: not assessed today Peristomal assessment: not assessed today Treatment options for stomal/peristomal skin: barrier Output light brown thick liquid effluent Ostomy pouching: 2pc.  Education provided: n/a, pt sedated on vent Enrolled patient in [employer] Discharge program: Yes/No Pouch intact, no leakage noted. No date on pouch, unsure if it is the one from [facility] visit. Patient went to OR 6/4 for hernia repair with mesh, NPWT replaced in OR, question if they replaced ostomy barrier and pouch then. Not noted in surgical note, bedside RN unsure of date. Have ordered pouches Kellie Simmering [phone]) for room for bedside RN and WOC use. Pt has barrier rings and barriers but no pouches. Three ordered. [place] team will continue to follow this patient. Fara Olden, RN-C, WTA-C, [place] Wound Treatment Associate Ostomy Care Associate

## 2017-09-24 NOTE — Progress Notes (Signed)
Called Elink regarding increase in pt's HR in 120s and asynchronous with the vent, gave pt's PRN dose of versed. Pt appeared to be relaxed, RR decreasing, and HR came down to 110s briefly.  Pt's HR began to increase back to 120s, called Elink again,  LR bolus ordered and running, awaiting results.

## 2017-09-24 NOTE — Progress Notes (Signed)
[facility]/Trauma Progress Note  1 Day Post-Op   Assessment/Plan Principal Problem:   Mesenteric ischemia (HCC) Active Problems:   Essential hypertension   Arterial atherosclerosis   HLD (hyperlipidemia)   Abdominal pain   PUD (peptic ulcer disease)   Occlusion of superior mesenteric artery (HCC)   Respiratory failure (HCC)   Acute respiratory failure with hypoxemia (HCC)  Mesenteric ischemia 1.Exploratory laparotomy right hemicolectomy 09/04/2017 Dr. Stark Klein 2.Abdominal exploration, application of temporary abdominal closure device(ABTHERA), 09/04/2017 Dr. Nadeen Landau 3.Oratory laparotomy, ileostomy, application of open abdominal wound VAC, 09/06/2017 Dr. Georganna Skeans 4. Open abdomen for attempted closure, 09/08/2017 Dr. Judeth Horn 5. Oratory laparotomy with possible wound closure(prior sutures upper&lower portion of the fascia had broken), 09/10/2017 Dr. Judeth Horn 6. Reexploration open abdominal wound, application ofABTHERAWOUND VAC,09/14/2017 Dr. Greer Pickerel 7. Exploratory laparotomy and partial closure of abdomen using the elastic tension closure device (ABRA), Dr. Hulen Skains, 05/29 8. exploratory laparotomy and dressing change, Dr. Ninfa Linden, 05/31 9. Changed negative pressure dressing, Dr. Kieth Brightly, 06/03 10. reopening of recent laparotomy, incisional hernia repair, insertion of mesh, placement of negative pressure dressing, Dr. Kieth Brightly, 06/04   FEN: tube feeds VTE: SCD's ID: none currently Foley: yes Follow up: TBD  DISPO: vac change Friday at bedside by surgery team. Febrile overnight, WBC WNL. Will monitor.     LOS: 20 days    Subjective: CC: open abdomen  Pt is sedated and on the ventilator. No family at bedside  Objective: Vital signs in last 24 hours: Temp:  [98.7 F (37.1 C)-101.5 F (38.6 C)] 99.1 F (37.3 C) (06/05 0742) Pulse Rate:  [81-124] 109 (06/05 0716) Resp:  [0-36] 24 (06/05 0716) BP:  (95-123)/(44-70) 103/48 (06/05 0716) SpO2:  [90 %-100 %] 100 % (06/05 0717) FiO2 (%):  [40 %] 40 % (06/05 0717) Weight:  [120.9 kg (266 lb 8.6 oz)] 120.9 kg (266 lb 8.6 oz) (06/05 0300) Last BM Date: 09/22/17  Intake/Output from previous day: 06/04 0701 - 06/05 0700 In: 3385 [I.V.:1580; IR/WE:3154; IV Piggyback:350] Out: 3070 [Urine:870; Emesis/NG output:200; Drains:600; Stool:1350; Blood:50] Intake/Output this shift: No intake/output data recorded.  PE: Gen:  On vent and sedated Card:  Mild tachycardia, regular rhythm Pulm:  On vent Abd: wound vac in place, brown liquid stool in ostomy bag, good BS Skin: warm and dry   Anti-infectives: Anti-infectives (From admission, onward)   Start     Dose/Rate Route Frequency Ordered Stop   09/19/17 0730  ceFAZolin (ANCEF) IVPB 2g/100 mL premix     2 g 200 mL/hr over 30 Minutes Intravenous On call to O.R. 09/19/17 0715 09/19/17 1057   09/17/17 1145  ceFAZolin (ANCEF) 3 g in dextrose 5 % 50 mL IVPB     3 g 100 mL/hr over 30 Minutes Intravenous To [facility] 09/17/17 0923 09/17/17 1449   09/07/17 1545  cefTRIAXone (ROCEPHIN) 2 g in sodium chloride 0.9 % 100 mL IVPB     2 g 200 mL/hr over 30 Minutes Intravenous Every 24 hours 09/07/17 1543 09/11/17 1605   09/04/17 1400  piperacillin-tazobactam (ZOSYN) IVPB 3.375 g  Status:  Discontinued     3.375 g 12.5 mL/hr over 240 Minutes Intravenous Every 8 hours 09/04/17 1157 09/07/17 1541   09/04/17 1100  ceFAZolin (ANCEF) IVPB 2g/100 mL premix  Status:  Discontinued     2 g 200 mL/hr over 30 Minutes Intravenous Every 8 hours 09/04/17 1035 09/04/17 1157   09/04/17 0630  piperacillin-tazobactam (ZOSYN) IVPB 3.375 g  Status:  Discontinued  3.375 g 100 mL/hr over 30 Minutes Intravenous  Once 09/04/17 0617 09/04/17 0626   09/04/17 0619  piperacillin-tazobactam (ZOSYN) IVPB 3.375 g     3.375 g 100 mL/hr over 30 Minutes Intravenous 60 min pre-op 09/04/17 0620 09/04/17 0717   09/04/17 0030   piperacillin-tazobactam (ZOSYN) IVPB 3.375 g     3.375 g 100 mL/hr over 30 Minutes Intravenous  Once 09/04/17 0024 09/04/17 0144      Lab Results:  Recent Labs    09/23/17 0336 09/24/17 0234  WBC 8.0 8.5  HGB 7.0* 8.9*  HCT 23.3* 28.9*  PLT 223 256   BMET Recent Labs    09/23/17 0336 09/24/17 0234  NA 139 140  K 4.4 4.3  CL 112* 116*  CO2 21* 18*  GLUCOSE 113* 110*  BUN 26* 23*  CREATININE 1.32* 1.37*  CALCIUM 7.3* 6.9*   PT/INR No results for input(s): LABPROT, INR in the last 72 hours. CMP     Component Value Date/Time   NA 140 09/24/2017 0234   K 4.3 09/24/2017 0234   CL 116 (H) 09/24/2017 0234   CO2 18 (L) 09/24/2017 0234   GLUCOSE 110 (H) 09/24/2017 0234   BUN 23 (H) 09/24/2017 0234   CREATININE 1.37 (H) 09/24/2017 0234   CALCIUM 6.9 (L) 09/24/2017 0234   PROT 4.5 (L) 09/22/2017 0341   ALBUMIN 1.3 (L) 09/22/2017 0341   AST 25 09/22/2017 0341   ALT 20 09/22/2017 0341   ALKPHOS 67 09/22/2017 0341   BILITOT 0.3 09/22/2017 0341   GFRNONAA 52 (L) 09/24/2017 0234   GFRAA >60 09/24/2017 0234   Lipase     Component Value Date/Time   LIPASE 37 09/03/2017 1644    Studies/Results: Dg Chest Port 1 View  Result Date: 09/23/2017 CLINICAL DATA:  Tracheostomy.  Respiratory failure. EXAM: PORTABLE CHEST 1 VIEW FINDINGS: The heart is enlarged. There is no consolidation or edema. Small RIGHT effusion is stable. Tracheostomy tube good position. Nasogastric tube is coiled in the stomach. IMPRESSION: Stable chest. Electronically Signed   By: Staci Righter M.D.   On: 09/23/2017 07:39      Kalman Drape , [facility] 09/24/2017, 8:17 AM  Pager: [phone] Mon-Wed, Friday 7:00am-4:30pm Thurs 7am-11:30am  Consults: [phone]

## 2017-09-24 NOTE — Progress Notes (Signed)
PULMONARY / CRITICAL CARE MEDICINE   Name: Luke Kaufman MRN: [id number] DOB: [birth year]    ADMISSION DATE:  09/03/2017 CONSULTATION DATE:  5/16  REFERRING MD:  Oneida Alar  Interval Hx: Patient went to the OR again yesterday for a trial of closure and had incisional hernia repair, insertion of mesh, placement of negative pressure dressing. Patient is sedated not following commands. He was transfused with PRBC yesterday.   .  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 Fascia remains open, agitation barrier to weaning. Trached 5/24., to return to OR 5/26 to place a wound closure device   STUDIES:  CT ab/pelvis 5/15 > portal venous gas in liver, sigmoid colon with diverticular disease, atrophic left kidney  CULTURES: 5/17 resp > e coli resistant to multiple antibiotics 5/17 blood >  negative  ANTIBIOTICS: 5/15 zosyn > 5/19  5/19 ceftriaxone > 5/23   LINES/TUBES: 5/16 R [place] CVL > 5/24 5/16 ETT > 5/24 5/24 Trach 5/18 R femoral arterial line > 5/24 5/24 LUA PICC   SIGNIFICANT EVENTS: 5/16 mesenteric angiogram and aorto to SMA bypass 5/16 R hemicolectomy 5/18 ileostomy 5/19 abdominal washout and partial fascial closure 5/21 TF started, hypoxia with turning  5/22 Return to OR for exploration, attempted closure > unable 5/24 Trach 5/26 OR for debridement and dressing change 5/29 Exploratory laparotomy, placement of ABBRA closure system 5/31 - No events overnight. Plans to return to OR for dressing change    1. Exploratory laparotomy right hemicolectomy 09/04/2017 Dr. Stark Klein 2.Abdominal exploration, application of temporary abdominal closure device(ABTHERA), 09/04/2017 Dr. Nadeen Landau 3.Oratory laparotomy, ileostomy, application of open abdominal wound VAC,  09/06/2017 Dr. Georganna Skeans 4. Open abdomen for attempted closure, 09/08/2017 Dr. Judeth Horn 5. Oratory laparotomy with possible wound closure(prior sutures upper&lower portion of the fascia had broken), 09/10/2017 Dr. Judeth Horn 6. Reexploration open abdominal wound, application ofABTHERAWOUND VAC,09/14/2017 Dr. Greer Pickerel 7. Exploratory laparotomy and partial closure of abdomen using the elastic tension closure device (ABRA), Dr. Hulen Skains, 05/29 8. exploratory laparotomy and dressing change, Dr. Ninfa Linden, 05/31 9. Changed negative pressure dressing, Dr. Kieth Brightly, 06/03 10. reopening of recent laparotomy, incisional hernia repair, insertion of mesh, placement of negative pressure dressing, Dr. Kieth Brightly, 06/04      SUBJECTIVE/OVERNIGHT/INTERVAL HX Sedated   VITAL SIGNS: BP (!) 97/49   Pulse (!) 109   Temp 99.1 F (37.3 C) (Oral)   Resp (!) 25   Ht 5\' 7"  (1.702 m)   Wt 120.9 kg (266 lb 8.6 oz)   SpO2 100%   BMI 41.75 kg/m   HEMODYNAMICS: CVP:  [3 mmHg-8 mmHg] 3 mmHg  VENTILATOR SETTINGS: Vent Mode: PRVC FiO2 (%):  [40 %] 40 % Set Rate:  [20 bmp] 20 bmp Vt Set:  [530 mL] 530 mL PEEP:  [5 cmH20] 5 cmH20 Pressure Support:  [5 cmH20] 5 cmH20 Plateau Pressure:  [11 cmH20-19 cmH20] 16 cmH20  INTAKE / OUTPUT: I/O last 3 completed shifts: In: 22 [I.V.:2228; NG/GT:2765; IV Piggyback:400] Out: 7829 [Urine:1550; Emesis/NG output:200; Drains:1050; FAOZH:0865; Blood:50]  Physical exam General: Appears older than stated age 76: tracheostomy midline  Neuro: Sedated CV: Heart sounds are regular normal sinus rhythm PULM: Coarse rhonchi bilaterally decreased in bases GI: Open abdomen wound VAC in place Extremities: warm/dry, 1+ edema  Skin: no rashes or lesions   PULMONARY  No results for input(s): PHART, PCO2ART, PO2ART, HCO3, TCO2, O2SAT in the last 168 hours.  Invalid input(s): PCO2, PO2  CBC Recent Labs  Lab 09/22/17 0341 09/23/17 0336 09/24/17 0234   HGB 7.6* 7.0* 8.9*  HCT 25.0* 23.3* 28.9*  WBC 7.1 8.0 8.5  PLT 221 223 256    COAGULATION No results for input(s): INR in the last 168 hours.  CARDIAC  No results for input(s): TROPONINI in the last 168 hours. No results for input(s): PROBNP in the last 168 hours.   CHEMISTRY Recent Labs  Lab 09/20/17 0430 09/21/17 0205 09/22/17 0341 09/23/17 0336 09/24/17 0234  NA 141 142 140 139 140  K 3.9 3.8 4.2 4.4 4.3  CL 113* 116* 115* 112* 116*  CO2 21* 22 22 21* 18*  GLUCOSE 118* 96 110* 113* 110*  BUN 25* 23* 21* 26* 23*  CREATININE 1.40* 1.30* 1.34* 1.32* 1.37*  CALCIUM 7.2* 7.0* 7.1* 7.3* 6.9*  MG 2.1 1.9 2.1 1.9 1.8  PHOS 3.2 2.9 3.0 3.2 2.9   Estimated Creatinine Clearance: 65.1 mL/min (A) (by C-G formula based on SCr of 1.37 mg/dL (H)).   LIVER Recent Labs  Lab 09/18/17 0906 09/19/17 0317 09/22/17 0341  AST  --  22 25  ALT  --  13* 20  ALKPHOS  --  48 67  BILITOT  --  0.5 0.3  PROT  --  4.3* 4.5*  ALBUMIN 1.7* 1.5* 1.3*     INFECTIOUS No results for input(s): LATICACIDVEN, PROCALCITON in the last 168 hours.   ENDOCRINE CBG (last 3)  Recent Labs    09/23/17 2342 09/24/17 0344 09/24/17 0740  GLUCAP 135* 115* 136*     IMAGING x48h  - image(s) personally visualized  -   highlighted in bold Dg Chest Port 1 View  Result Date: 09/23/2017 CLINICAL DATA:  Tracheostomy.  Respiratory failure. EXAM: PORTABLE CHEST 1 VIEW FINDINGS: The heart is enlarged. There is no consolidation or edema. Small RIGHT effusion is stable. Tracheostomy tube good position. Nasogastric tube is coiled in the stomach. IMPRESSION: Stable chest. Electronically Signed   By: Staci Righter M.D.   On: 09/23/2017 07:39     DISCUSSION: 67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 Fascia remains open   ASSESSMENT / PLAN:   Active issues:  Ischemic bowel, s/p resection with hemicolectomy and ileostomy; now status post  abdominal washout several times Plan Care per surgery Resume tube feeds  Wound per surgery   Acute respiratory failure with hypoxemia with acute pulmonary edema  Status post tracheostomy 5/24 H/O COPD  Plan Continue full ventilator support Bronchodilators as needed Chest x-ray  Hypotension.  Favor sedation related History of coronary artery disease, prior MI, hypertension and peripheral vascular disease Plan Off Levophed but might resume since borderline  Hold antihypertensives at this time   Acute kidney injury Lab Results  Component Value Date   CREATININE 1.37 (H) 09/24/2017   CREATININE 1.32 (H) 09/23/2017   CREATININE 1.34 (H) 09/22/2017   Recent Labs  Lab 09/22/17 0341 09/23/17 0336 09/24/17 0234  K 4.2 4.4 4.3    Plan Avoid hypotension Renal dose medications Strict intake output Follow-up daily Chemistry    Recent Labs  Lab 09/22/17 0341 09/23/17 0336 09/24/17 0234  NA 140 139 140    Plan Continue free water Chemistry daily  Anemia of critical illness Recent Labs    09/23/17 0336 09/24/17 0234  HGB 7.0* 8.9*   -  transfused PRBC yesterday  -No evidence of active bleeding currently Plan Trend CBC Infused per protocol  Mild leukocytosis, no fevers Plan Trend CBC and fever curve  Hyperglycemia CBG (last 3)  Recent Labs    09/23/17 2342 09/24/17 0344 09/24/17 0740  GLUCAP 135* 115* 136*    Has excellent glycemic control Plan Sliding scale insulin   FAMILY  - Updates: 09/24/2017 no family at bedside  I have spent 32 mins of CC time bedside or in the unit exclusive of billable procedures   Silver Huguenin MD r page [phone] 09/24/2017, 8:39 AM

## 2017-09-24 NOTE — Anesthesia Preprocedure Evaluation (Addendum)
Anesthesia Evaluation  Patient identified by MRN, date of birth, ID band Patient unresponsive    Reviewed: Allergy & Precautions, NPO status , Patient's Chart, lab work & pertinent test results  Airway Mallampati: Trach   Neck ROM: Limited  Mouth opening: Limited Mouth Opening  Dental   Pulmonary Current Smoker,    + rhonchi  (-) decreased breath sounds      Cardiovascular hypertension,  Rhythm:Regular Rate:Normal     Neuro/Psych    GI/Hepatic   Endo/Other    Renal/GU      Musculoskeletal   Abdominal (+) + obese, Abdominal wound open with retention dressing in place  Peds  Hematology   Anesthesia Other Findings   Reproductive/Obstetrics                            Anesthesia Physical Anesthesia Plan  ASA: III  Anesthesia Plan: General   Post-op Pain Management:    Induction: Intravenous  PONV Risk Score and Plan:   Airway Management Planned: Tracheostomy  Additional Equipment:   Intra-op Plan:   Post-operative Plan: Post-operative intubation/ventilation  Informed Consent: I have reviewed the patients History and Physical, chart, labs and discussed the procedure including the risks, benefits and alternatives for the proposed anesthesia with the patient or authorized representative who has indicated his/her understanding and acceptance.     Plan Discussed with: CRNA and Anesthesiologist  Anesthesia Plan Comments:         Anesthesia Quick Evaluation

## 2017-09-25 ENCOUNTER — Inpatient Hospital Stay (HOSPITAL_COMMUNITY): Payer: PPO

## 2017-09-25 LAB — GLUCOSE, CAPILLARY
GLUCOSE-CAPILLARY: 109 mg/dL — AB (ref 65–99)
GLUCOSE-CAPILLARY: 105 mg/dL — AB (ref 65–99)
Glucose-Capillary: 119 mg/dL — ABNORMAL HIGH (ref 65–99)
Glucose-Capillary: 127 mg/dL — ABNORMAL HIGH (ref 65–99)
Glucose-Capillary: 122 mg/dL — ABNORMAL HIGH (ref 65–99)

## 2017-09-25 LAB — BASIC METABOLIC PANEL
ANION GAP: 7 (ref 5–15)
BUN: 26 mg/dL — ABNORMAL HIGH (ref 6–20)
CALCIUM: 7.7 mg/dL — AB (ref 8.9–10.3)
CO2: 20 mmol/L — ABNORMAL LOW (ref 22–32)
Chloride: 115 mmol/L — ABNORMAL HIGH (ref 101–111)
Creatinine, Ser: 1.42 mg/dL — ABNORMAL HIGH (ref 0.61–1.24)
GFR, EST AFRICAN AMERICAN: 58 mL/min — AB (ref >60)
GFR, EST NON AFRICAN AMERICAN: 50 mL/min — AB (ref >60)
GLUCOSE: 124 mg/dL — AB (ref 65–99)
Potassium: 4 mmol/L (ref 3.5–5.1)
Sodium: 142 mmol/L (ref 135–145)

## 2017-09-25 LAB — CBC WITH DIFFERENTIAL/PLATELET
Abs Immature Granulocytes: 0.1 10*3/uL (ref 0.0–0.1)
BASOS ABS: 0 10*3/uL (ref 0.0–0.1)
Basophils Relative: 0 %
EOS PCT: 1 %
Eosinophils Absolute: 0.1 10*3/uL (ref 0.0–0.7)
HCT: 28.5 % — ABNORMAL LOW (ref 39.0–52.0)
Hemoglobin: 8.8 g/dL — ABNORMAL LOW (ref 13.0–17.0)
IMMATURE GRANULOCYTES: 1 %
LYMPHS ABS: 1 10*3/uL (ref 0.7–4.0)
LYMPHS PCT: 11 %
MCH: 28.4 pg (ref 26.0–34.0)
MCHC: 30.9 g/dL (ref 30.0–36.0)
MCV: 91.9 fL (ref 78.0–100.0)
MONO ABS: 0.8 10*3/uL (ref 0.1–1.0)
MONOS PCT: 9 %
NEUTROS ABS: 6.8 10*3/uL (ref 1.7–7.7)
Neutrophils Relative %: 78 %
PLATELETS: 275 10*3/uL (ref 150–400)
RBC: 3.1 MIL/uL — ABNORMAL LOW (ref 4.22–5.81)
RDW: 14.8 % (ref 11.5–15.5)
WBC: 8.7 10*3/uL (ref 4.0–10.5)

## 2017-09-25 NOTE — Progress Notes (Signed)
PULMONARY / CRITICAL CARE MEDICINE   Name: Luke Kaufman MRN: [id number] DOB: [birth year]    ADMISSION DATE:  09/03/2017 CONSULTATION DATE:  5/16  REFERRING MD:  Oneida Alar  Interval Hx: Patient is sedated not following commands. Still on ventilator through a tracheostomy. 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 Fascia remains open, agitation barrier to weaning. Trached 5/24., to return to OR 5/26 to place a wound closure device   STUDIES:  CT ab/pelvis 5/15 > portal venous gas in liver, sigmoid colon with diverticular disease, atrophic left kidney  CULTURES: 5/17 resp > e coli resistant to multiple antibiotics 5/17 blood >  negative  ANTIBIOTICS: 5/15 zosyn > 5/19  5/19 ceftriaxone > 5/23   LINES/TUBES: 5/16 R [place] CVL > 5/24 5/16 ETT > 5/24 5/24 Trach 5/18 R femoral arterial line > 5/24 5/24 LUA PICC   SIGNIFICANT EVENTS: 5/16 mesenteric angiogram and aorto to SMA bypass 5/16 R hemicolectomy 5/18 ileostomy 5/19 abdominal washout and partial fascial closure 5/21 TF started, hypoxia with turning  5/22 Return to OR for exploration, attempted closure > unable 5/24 Trach 5/26 OR for debridement and dressing change 5/29 Exploratory laparotomy, placement of ABBRA closure system 5/31 - No events overnight. Plans to return to OR for dressing change    1. Exploratory laparotomy right hemicolectomy 09/04/2017 Dr. Stark Klein 2.Abdominal exploration, application of temporary abdominal closure device(ABTHERA), 09/04/2017 Dr. Nadeen Landau 3.Oratory laparotomy, ileostomy, application of open abdominal wound VAC, 09/06/2017 Dr. Georganna Skeans 4. Open abdomen for attempted closure, 09/08/2017 Dr. Judeth Horn 5. Oratory  laparotomy with possible wound closure(prior sutures upper&lower portion of the fascia had broken), 09/10/2017 Dr. Judeth Horn 6. Reexploration open abdominal wound, application ofABTHERAWOUND VAC,09/14/2017 Dr. Greer Pickerel 7. Exploratory laparotomy and partial closure of abdomen using the elastic tension closure device (ABRA), Dr. Hulen Skains, 05/29 8. exploratory laparotomy and dressing change, Dr. Ninfa Linden, 05/31 9. Changed negative pressure dressing, Dr. Kieth Brightly, 06/03 10. reopening of recent laparotomy, incisional hernia repair, insertion of mesh, placement of negative pressure dressing, Dr. Kieth Brightly, 06/04      SUBJECTIVE/OVERNIGHT/INTERVAL HX Sedated   VITAL SIGNS: BP (!) 145/69   Pulse (!) 110   Temp 99.6 F (37.6 C) (Oral)   Resp (!) 33   Ht 5\' 7"  (1.702 m)   Wt 120.9 kg (266 lb 8.6 oz)   SpO2 100%   BMI 41.75 kg/m   HEMODYNAMICS: CVP:  [5 mmHg-10 mmHg] 5 mmHg  VENTILATOR SETTINGS: Vent Mode: PRVC FiO2 (%):  [40 %] 40 % Set Rate:  [20 bmp] 20 bmp Vt Set:  [530 mL] 530 mL PEEP:  [5 cmH20] 5 cmH20 Plateau Pressure:  [16 cmH20-20 cmH20] 20 cmH20  INTAKE / OUTPUT: I/O last 3 completed shifts: In: 4467.5 [I.V.:1092.5; NG/GT:3275; IV Piggyback:100] Out: 9518 [Urine:1850; Drains:750; ACZYS:0630]  Physical exam General: Appears older than stated age 18: tracheostomy midline  Neuro: Sedated CV: Heart sounds are regular normal sinus rhythm PULM: Coarse rhonchi bilaterally decreased in bases GI: Open abdomen wound VAC in place Extremities: warm/dry, 1+ edema  Skin: no rashes or lesions   PULMONARY No results for input(s): PHART, PCO2ART, PO2ART, HCO3, TCO2, O2SAT in the last 168 hours.  Invalid input(s): PCO2, PO2  CBC Recent Labs  Lab 09/23/17 0336 09/24/17 0234 09/25/17 0500  HGB 7.0* 8.9* 8.8*  HCT 23.3* 28.9* 28.5*  WBC 8.0 8.5 8.7  PLT 223 256 275    COAGULATION No results for input(s): INR in the last 168 hours.  CARDIAC  No results  for input(s): TROPONINI in the last 168 hours. No results for input(s): PROBNP in the last 168 hours.   CHEMISTRY Recent Labs  Lab 09/20/17 0430 09/21/17 0205 09/22/17 0341 09/23/17 0336 09/24/17 0234 09/25/17 0500  NA 141 142 140 139 140 142  K 3.9 3.8 4.2 4.4 4.3 4.0  CL 113* 116* 115* 112* 116* 115*  CO2 21* 22 22 21* 18* 20*  GLUCOSE 118* 96 110* 113* 110* 124*  BUN 25* 23* 21* 26* 23* 26*  CREATININE 1.40* 1.30* 1.34* 1.32* 1.37* 1.42*  CALCIUM 7.2* 7.0* 7.1* 7.3* 6.9* 7.7*  MG 2.1 1.9 2.1 1.9 1.8  --   PHOS 3.2 2.9 3.0 3.2 2.9  --    Estimated Creatinine Clearance: 62.8 mL/min (A) (by C-G formula based on SCr of 1.42 mg/dL (H)).   LIVER Recent Labs  Lab 09/18/17 0906 09/19/17 0317 09/22/17 0341  AST  --  22 25  ALT  --  13* 20  ALKPHOS  --  48 67  BILITOT  --  0.5 0.3  PROT  --  4.3* 4.5*  ALBUMIN 1.7* 1.5* 1.3*     INFECTIOUS No results for input(s): LATICACIDVEN, PROCALCITON in the last 168 hours.   ENDOCRINE CBG (last 3)  Recent Labs    09/24/17 2319 09/25/17 0315 09/25/17 0737  GLUCAP 134* 119* 127*     IMAGING x48h  - image(s) personally visualized  -   highlighted in bold Dg Chest Port 1 View  Result Date: 09/24/2017 CLINICAL DATA:  Respiratory failure. EXAM: PORTABLE CHEST 1 VIEW COMPARISON:  09/23/2017 FINDINGS: Tracheostomy tube, enteric catheter and left PICC line in stable position. Cardiomediastinal silhouette is normal. Mediastinal contours appear intact. Low lung volumes with bilateral lower lobe atelectasis. Probable bilateral subpulmonic effusions. Osseous structures are without acute abnormality. Soft tissues are grossly normal. IMPRESSION: Low lung volumes with bilateral lower lobe atelectasis. Probable bilateral subpulmonic effusions. Electronically Signed   By: Fidela Salisbury M.D.   On: 09/24/2017 09:21     DISCUSSION: 67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  Fascia remains open   ASSESSMENT / PLAN:   Active issues:  Ischemic bowel, s/p resection with hemicolectomy and ileostomy; now status post abdominal washout several times Plan Care per surgery Resume tube feeds  Wound per surgery Vac change in the OR tomorrow    Acute respiratory failure with hypoxemia with acute pulmonary edema  Status post tracheostomy 5/24 H/O COPD  Plan Continue full ventilator support and try to wean when wound is closed  Bronchodilators as needed  Hypotension.  Favor sedation related History of coronary artery disease, prior MI, hypertension and peripheral vascular disease Plan BP is stable off levophed  Hold antihypertensives at this time   Acute kidney injury Lab Results  Component Value Date   CREATININE 1.42 (H) 09/25/2017   CREATININE 1.37 (H) 09/24/2017   CREATININE 1.32 (H) 09/23/2017   Recent Labs  Lab 09/23/17 0336 09/24/17 0234 09/25/17 0500  K 4.4 4.3 4.0    Plan Avoid hypotension Renal dose medications Strict intake output Follow-up daily Chemistry    Recent Labs  Lab 09/23/17 0336 09/24/17 0234 09/25/17 0500  NA 139 140  [place] free water Chemistry daily  Anemia of critical illness Recent Labs    09/24/17 0234 09/25/17 0500  HGB 8.9* 8.8*   - transfused PRBC yesterday  -No evidence of active bleeding currently Plan Trend CBC Infused per protocol  Mild leukocytosis, no fevers Plan Trend CBC and fever curve  Hyperglycemia CBG (last 3)  Recent Labs    09/24/17 2319 09/25/17 0315 09/25/17 0737  GLUCAP 134* 119* 127*    Has excellent glycemic control Plan Sliding scale insulin   FAMILY  - Updates: 09/25/2017 no family at bedside   Silver Huguenin MD r page [phone] 09/25/2017, 8:27 AM

## 2017-09-25 NOTE — Plan of Care (Signed)
Pt currently receiving Vital HP via NG tube. Pt eliminating greater than 30cc of urine an hour. Pt being turned Q2 in order to prevent breakdown of skin. Pt maintaining 02 sats at 100% w/ ventilator support.

## 2017-09-25 NOTE — Progress Notes (Signed)
[facility]/Trauma Progress Note  2 Days Post-Op   Assessment/Plan Principal Problem: Mesenteric ischemia (HCC) Active Problems: Essential hypertension Arterial atherosclerosis HLD (hyperlipidemia) Abdominal pain PUD (peptic ulcer disease) Occlusion of superior mesenteric artery (HCC) Respiratory failure (HCC) Acute respiratory failure with hypoxemia (HCC)  Mesenteric ischemia 1.Exploratory laparotomy right hemicolectomy 09/04/2017 Dr. Stark Klein 2.Abdominal exploration, application of temporary abdominal closure device(ABTHERA), 09/04/2017 Dr. Nadeen Landau 3.Oratory laparotomy, ileostomy, application of open abdominal wound VAC, 09/06/2017 Dr. Georganna Skeans 4. Open abdomen for attempted closure, 09/08/2017 Dr. Judeth Horn 5. Oratory laparotomy with possible wound closure(prior sutures upper&lower portion of the fascia had broken), 09/10/2017 Dr. Judeth Horn 6. Reexploration open abdominal wound, application ofABTHERAWOUND VAC,09/14/2017 Dr. Greer Pickerel 7. Exploratory laparotomy and partial closure of abdomen using the elastic tension closure device (ABRA), Dr. Hulen Skains, 05/29 8. exploratory laparotomy and dressing change, Dr. Ninfa Linden, 05/31 9. Changed negative pressure dressing, Dr. Kieth Brightly, 06/03 10. reopening of recent laparotomy, incisional hernia repair, insertion of mesh, placement of negative pressure dressing, Dr. Kieth Brightly, 06/04   PJK:DTOI feeds VTE: SCD's ZT:IWPY currently Foley:yes Follow up:TBD  DISPO:vac change Friday at bedside by surgery team. Afebrile. WBC WNL. Will monitor. Sedation has been off since 1400 06/05 and pt is minimally responsive.     LOS: 21 days    Subjective: CC: open abdomen  Pt is on the ventilator.No family at bedside. Will not open eyes or wake.    Objective: Vital signs in last 24 hours: Temp:  [98.4 F (36.9 C)-99.6 F (37.6 C)] 99.6 F (37.6 C) (06/06 0733) Pulse  Rate:  [85-104] 104 (06/06 0328) Resp:  [13-35] 27 (06/06 0733) BP: (96-147)/(45-72) 145/69 (06/06 0733) SpO2:  [100 %] 100 % (06/06 0733) FiO2 (%):  [40 %] 40 % (06/06 0400) Weight:  [120.9 kg (266 lb 8.6 oz)] 120.9 kg (266 lb 8.6 oz) (06/06 0315) Last BM Date: 09/24/17  Intake/Output from previous day: 06/05 0701 - 06/06 0700 In: 2174.5 [I.V.:414.5; NG/GT:1710; IV Piggyback:50] Out: 2355 [Urine:1180; Drains:400; Stool:775] Intake/Output this shift: No intake/output data recorded.  PE: Gen:  On vent, will not awake or FC Card:  Mild tachycardia, regular rhythm Pulm:  On vent Abd: wound vac in place with high serosanguinous output, brown liquid stool in ostomy bag, good BS. No response to palpation of abdomen Skin: warm and dry  Anti-infectives: Anti-infectives (From admission, onward)   Start     Dose/Rate Route Frequency Ordered Stop   09/19/17 0730  ceFAZolin (ANCEF) IVPB 2g/100 mL premix     2 g 200 mL/hr over 30 Minutes Intravenous On call to O.R. 09/19/17 0715 09/19/17 1057   09/17/17 1145  ceFAZolin (ANCEF) 3 g in dextrose 5 % 50 mL IVPB     3 g 100 mL/hr over 30 Minutes Intravenous To ShortStay Surgical 09/17/17 0923 09/17/17 1449   09/07/17 1545  cefTRIAXone (ROCEPHIN) 2 g in sodium chloride 0.9 % 100 mL IVPB     2 g 200 mL/hr over 30 Minutes Intravenous Every 24 hours 09/07/17 1543 09/11/17 1605   09/04/17 1400  piperacillin-tazobactam (ZOSYN) IVPB 3.375 g  Status:  Discontinued     3.375 g 12.5 mL/hr over 240 Minutes Intravenous Every 8 hours 09/04/17 1157 09/07/17 1541   09/04/17 1100  ceFAZolin (ANCEF) IVPB 2g/100 mL premix  Status:  Discontinued     2 g 200 mL/hr over 30 Minutes Intravenous Every 8 hours 09/04/17 1035 09/04/17 1157   09/04/17 0630  piperacillin-tazobactam (ZOSYN) IVPB 3.375 g  Status:  Discontinued  3.375 g 100 mL/hr over 30 Minutes Intravenous  Once 09/04/17 0617 09/04/17 0626   09/04/17 0619  piperacillin-tazobactam (ZOSYN) IVPB 3.375 g      3.375 g 100 mL/hr over 30 Minutes Intravenous 60 min pre-op 09/04/17 0620 09/04/17 0717   09/04/17 0030  piperacillin-tazobactam (ZOSYN) IVPB 3.375 g     3.375 g 100 mL/hr over 30 Minutes Intravenous  Once 09/04/17 0024 09/04/17 0144      Lab Results:  Recent Labs    09/24/17 0234 09/25/17 0500  WBC 8.5 8.7  HGB 8.9* 8.8*  HCT 28.9* 28.5*  PLT 256 275   BMET Recent Labs    09/24/17 0234 09/25/17 0500  NA 140 142  K 4.3 4.0  CL 116* 115*  CO2 18* 20*  GLUCOSE 110* 124*  BUN 23* 26*  CREATININE 1.37* 1.42*  CALCIUM 6.9* 7.7*   PT/INR No results for input(s): LABPROT, INR in the last 72 hours. CMP     Component Value Date/Time   NA 142 09/25/2017 0500   K 4.0 09/25/2017 0500   CL 115 (H) 09/25/2017 0500   CO2 20 (L) 09/25/2017 0500   GLUCOSE 124 (H) 09/25/2017 0500   BUN 26 (H) 09/25/2017 0500   CREATININE 1.42 (H) 09/25/2017 0500   CALCIUM 7.7 (L) 09/25/2017 0500   PROT 4.5 (L) 09/22/2017 0341   ALBUMIN 1.3 (L) 09/22/2017 0341   AST 25 09/22/2017 0341   ALT 20 09/22/2017 0341   ALKPHOS 67 09/22/2017 0341   BILITOT 0.3 09/22/2017 0341   GFRNONAA 50 (L) 09/25/2017 0500   GFRAA 58 (L) 09/25/2017 0500   Lipase     Component Value Date/Time   LIPASE 37 09/03/2017 1644    Studies/Results: Dg Chest Port 1 View  Result Date: 09/24/2017 CLINICAL DATA:  Respiratory failure. EXAM: PORTABLE CHEST 1 VIEW COMPARISON:  09/23/2017 FINDINGS: Tracheostomy tube, enteric catheter and left PICC line in stable position. Cardiomediastinal silhouette is normal. Mediastinal contours appear intact. Low lung volumes with bilateral lower lobe atelectasis. Probable bilateral subpulmonic effusions. Osseous structures are without acute abnormality. Soft tissues are grossly normal. IMPRESSION: Low lung volumes with bilateral lower lobe atelectasis. Probable bilateral subpulmonic effusions. Electronically Signed   By: Fidela Salisbury M.D.   On: 09/24/2017 09:21      Kalman Drape , [facility] 09/25/2017, 7:42 AM  Pager: [phone] Mon-Wed, Friday 7:00am-4:30pm Thurs 7am-11:30am  Consults: [phone]

## 2017-09-25 NOTE — Progress Notes (Signed)
Called E-Link to report pt's neurologic status. Pt sedation has been off since 1400 per chart and continues to only be responsive to pain. Pt was Asynchronous w/ the vent  During night shift w/ sedation but sedation was titrated off during day shift. No new orders received. RN will continue to monitor.

## 2017-09-26 LAB — GLUCOSE, CAPILLARY
GLUCOSE-CAPILLARY: 99 mg/dL (ref 65–99)
Glucose-Capillary: 106 mg/dL — ABNORMAL HIGH (ref 65–99)
Glucose-Capillary: 113 mg/dL — ABNORMAL HIGH (ref 65–99)
Glucose-Capillary: 114 mg/dL — ABNORMAL HIGH (ref 65–99)
Glucose-Capillary: 124 mg/dL — ABNORMAL HIGH (ref 65–99)
Glucose-Capillary: 126 mg/dL — ABNORMAL HIGH (ref 65–99)

## 2017-09-26 LAB — COMPREHENSIVE METABOLIC PANEL
ALK PHOS: 127 U/L — AB (ref 38–126)
ALT: 39 U/L (ref 17–63)
ANION GAP: 6 (ref 5–15)
AST: 83 U/L — ABNORMAL HIGH (ref 15–41)
Albumin: 1.2 g/dL — ABNORMAL LOW (ref 3.5–5.0)
BILIRUBIN TOTAL: 0.4 mg/dL (ref 0.3–1.2)
BUN: 30 mg/dL — ABNORMAL HIGH (ref 6–20)
CALCIUM: 7.5 mg/dL — AB (ref 8.9–10.3)
CO2: 20 mmol/L — ABNORMAL LOW (ref 22–32)
CREATININE: 1.41 mg/dL — AB (ref 0.61–1.24)
Chloride: 116 mmol/L — ABNORMAL HIGH (ref 101–111)
GFR, EST AFRICAN AMERICAN: 58 mL/min — AB (ref >60)
GFR, EST NON AFRICAN AMERICAN: 50 mL/min — AB (ref >60)
Glucose, Bld: 110 mg/dL — ABNORMAL HIGH (ref 65–99)
Potassium: 3.8 mmol/L (ref 3.5–5.1)
Sodium: 142 mmol/L (ref 135–145)
TOTAL PROTEIN: 5 g/dL — AB (ref 6.5–8.1)

## 2017-09-26 LAB — LACTIC ACID, PLASMA: LACTIC ACID, VENOUS: 1.4 mmol/L (ref 0.5–1.9)

## 2017-09-26 LAB — PROCALCITONIN: PROCALCITONIN: 0.59 ng/mL

## 2017-09-26 MED ORDER — FENTANYL CITRATE (PF) 100 MCG/2ML IJ SOLN
25.0000 ug | INTRAMUSCULAR | Status: DC | PRN
  Administered 2017-09-26: 25 ug via INTRAVENOUS
  Administered 2017-09-27: 50 ug via INTRAVENOUS
  Administered 2017-09-27 (×3): 100 ug via INTRAVENOUS
  Administered 2017-09-28: 50 ug via INTRAVENOUS
  Administered 2017-09-28 – 2017-09-30 (×9): 100 ug via INTRAVENOUS
  Filled 2017-09-26 (×16): qty 2

## 2017-09-26 MED ORDER — ALTEPLASE 2 MG IJ SOLR
2.0000 mg | Freq: Once | INTRAMUSCULAR | Status: AC
  Administered 2017-09-26: 2 mg

## 2017-09-26 MED ORDER — DEXMEDETOMIDINE HCL IN NACL 400 MCG/100ML IV SOLN
0.0000 ug/kg/h | INTRAVENOUS | Status: AC
  Administered 2017-09-26: 0.6 ug/kg/h via INTRAVENOUS
  Administered 2017-09-26 (×2): 1 ug/kg/h via INTRAVENOUS
  Administered 2017-09-26: 0.8 ug/kg/h via INTRAVENOUS
  Administered 2017-09-27: 0.3 ug/kg/h via INTRAVENOUS
  Administered 2017-09-27: 0.5 ug/kg/h via INTRAVENOUS
  Administered 2017-09-28: 0.7 ug/kg/h via INTRAVENOUS
  Administered 2017-09-28: 0.5 ug/kg/h via INTRAVENOUS
  Administered 2017-09-28 (×2): 0.7 ug/kg/h via INTRAVENOUS
  Administered 2017-09-29: 0.8 ug/kg/h via INTRAVENOUS
  Administered 2017-09-29: 0.7 ug/kg/h via INTRAVENOUS
  Filled 2017-09-26 (×15): qty 100

## 2017-09-26 MED ORDER — LIDOCAINE-EPINEPHRINE 1 %-1:100000 IJ SOLN
20.0000 mL | Freq: Once | INTRAMUSCULAR | Status: DC
  Filled 2017-09-26 (×2): qty 20

## 2017-09-26 NOTE — Progress Notes (Addendum)
PULMONARY / CRITICAL CARE MEDICINE   Name: Luke Kaufman MRN: [id number] DOB: 27-Feb-1951    ADMISSION DATE:  09/03/2017 CONSULTATION DATE:  5/16  REFERRING MD:  Oneida Alar  Interval Hx: Patient is sedated not following commands. Still on ventilator through a tracheostomy. 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 Fascia remains open, agitation barrier to weaning. Trached 5/24., to return to OR 5/26 to place a wound closure device   STUDIES:  CT ab/pelvis 5/15 > portal venous gas in liver, sigmoid colon with diverticular disease, atrophic left kidney  CULTURES: 5/17 resp > e coli resistant to multiple antibiotics 5/17 blood >  negative  ANTIBIOTICS: 5/15 zosyn > 5/19  5/19 ceftriaxone > 5/23   LINES/TUBES: 5/16 R [place] CVL > 5/24 5/16 ETT > 5/24 5/24 Trach 5/18 R femoral arterial line > 5/24 5/24 LUA PICC   SIGNIFICANT EVENTS: 5/16 mesenteric angiogram and aorto to SMA bypass 5/16 R hemicolectomy 5/18 ileostomy 5/19 abdominal washout and partial fascial closure 5/21 TF started, hypoxia with turning  5/22 Return to OR for exploration, attempted closure > unable 5/24 Trach 5/26 OR for debridement and dressing change 5/29 Exploratory laparotomy, placement of ABBRA closure system 5/31 - No events overnight. Plans to return to OR for dressing change    1. Exploratory laparotomy right hemicolectomy 09/04/2017 Dr. Stark Klein 2.Abdominal exploration, application of temporary abdominal closure device(ABTHERA), 09/04/2017 Dr. Nadeen Landau 3.Oratory laparotomy, ileostomy, application of open abdominal wound VAC, 09/06/2017 Dr. Georganna Skeans 4. Open abdomen for attempted closure, 09/08/2017 Dr. Judeth Horn 5. Oratory  laparotomy with possible wound closure(prior sutures upper&lower portion of the fascia had broken), 09/10/2017 Dr. Judeth Horn 6. Reexploration open abdominal wound, application ofABTHERAWOUND VAC,09/14/2017 Dr. Greer Pickerel 7. Exploratory laparotomy and partial closure of abdomen using the elastic tension closure device (ABRA), Dr. Hulen Skains, 05/29 8. exploratory laparotomy and dressing change, Dr. Ninfa Linden, 05/31 9. Changed negative pressure dressing, Dr. Kieth Brightly, 06/03 10. reopening of recent laparotomy, incisional hernia repair, insertion of mesh, placement of negative pressure dressing, Dr. Kieth Brightly, 06/04    SUBJECTIVE/OVERNIGHT/INTERVAL HX Veins heavily seen dated   VITAL SIGNS: Blood Pressure (Abnormal) 145/73   Pulse (Abnormal) 111   Temperature (Abnormal) 101 F (38.3 C) (Axillary)   Respiration (Abnormal) 32   Height 5\' 7"  (1.702 m)   Weight 258 lb 2.5 oz (117.1 kg)   Oxygen Saturation 100%   Body Mass Index 40.43 kg/m    HEMODYNAMICS: CVP:  [3 mmHg-9 mmHg] 8 mmHg  VENTILATOR SETTINGS: Vent Mode: PRVC FiO2 (%):  [40 %] 40 % Set Rate:  [20 bmp] 20 bmp Vt Set:  [530 mL] 530 mL PEEP:  [5 cmH20] 5 cmH20 Plateau Pressure:  [15 cmH20-19 cmH20] 17 cmH20  INTAKE / OUTPUT: I/O last 3 completed shifts: In: 3795 [I.V.:665; NG/GT:2980; IV Piggyback:150] Out: 3810 [Urine:1615; Drains:400; Stool:3150]  Physical exam General: 67 year old male patient currently sedated on a fentanyl infusion HEENT normocephalic atraumatic he has a #6 cuffed tracheostomy in place.  There is irritation and excoriation underneath the tracheostomy flange.  We have placed a new protective dressing there Pulmonary: Scattered rhonchi mechanically assisted breaths no accessory use Cardiac: Regular rate and rhythm Abdomen: Wound VAC in place, appears to be some purulent  drainage Abdomen otherwise soft, hypoactive but positive bowel sounds tolerating tube feeds him a liquid stools from ileostomy GU:  Clear yellow Neuro: Heavily sedated    PULMONARY No results for input(s): PHART, PCO2ART, PO2ART, HCO3, TCO2, O2SAT in the last 168 hours.  Invalid input(s): PCO2, PO2  CBC Recent Labs  Lab 09/23/17 0336 09/24/17 0234 09/25/17 0500  HGB 7.0* 8.9* 8.8*  HCT 23.3* 28.9* 28.5*  WBC 8.0 8.5 8.7  PLT 223 256 275    COAGULATION No results for input(s): INR in the last 168 hours.  CARDIAC  No results for input(s): TROPONINI in the last 168 hours. No results for input(s): PROBNP in the last 168 hours.   CHEMISTRY Recent Labs  Lab 09/20/17 0430 09/21/17 0205 09/22/17 0341 09/23/17 0336 09/24/17 0234 09/25/17 0500 09/26/17 0543  NA 141 142 140 139 140 142 142  K 3.9 3.8 4.2 4.4 4.3 4.0 3.8  CL 113* 116* 115* 112* 116* 115* 116*  CO2 21* 22 22 21* 18* 20* 20*  GLUCOSE 118* 96 110* 113* 110* 124* 110*  BUN 25* 23* 21* 26* 23* 26* 30*  CREATININE 1.40* 1.30* 1.34* 1.32* 1.37* 1.42* 1.41*  CALCIUM 7.2* 7.0* 7.1* 7.3* 6.9* 7.7* 7.5*  MG 2.1 1.9 2.1 1.9 1.8  --   --   PHOS 3.2 2.9 3.0 3.2 2.9  --   --    Estimated Creatinine Clearance: 62.2 mL/min (A) (by C-G formula based on SCr of 1.41 mg/dL (H)).   LIVER Recent Labs  Lab 09/22/17 0341 09/26/17 0543  AST 25 83*  ALT 20 39  ALKPHOS 67 127*  BILITOT 0.3 0.4  PROT 4.5* 5.0*  ALBUMIN 1.3* 1.2*     INFECTIOUS No results for input(s): LATICACIDVEN, PROCALCITON in the last 168 hours.   ENDOCRINE CBG (last 3)  Recent Labs    09/25/17 2354 09/26/17 0314 09/26/17 0818  GLUCAP 106* 99 113*     IMAGING x48h  - image(s) personally visualized  -   highlighted in bold Dg Chest Port 1 View  Result Date: 09/25/2017 CLINICAL DATA:  Tracheostomy. EXAM: PORTABLE CHEST 1 VIEW COMPARISON:  September 24, 2017 FINDINGS: The right PICC line and tracheostomy tube are stable. The NG tube terminates in the stomach. The cardiomediastinal silhouette is stable. The lungs are clear with no focal infiltrate. No pneumothorax.  IMPRESSION: Support apparatus as above.  No other abnormalities. Electronically Signed   By: Dorise Bullion III M.D   On: 09/25/2017 09:24     DISCUSSION: 67 y/o male with an SMA occlusion requiring emergent bypass and eventually R hemicolectomy and ilestomy is gone back to the operating room several times for washouts as well as working on abdominal closure.  Most recently had mesh placed.  He is been on IV sedation for a prolonged period of time and is at a very high risk for prolonged encephalopathy.  Ideally would like to transition therapies from a goal of sedating and supporting to progression.  Will discuss with surgical team in regards to timing.  Once cleared from a surgical standpoint we can initiate Precedex infusion, and PRN fentanyl.  Starting there.  Then adjust anxiety lytic and delirium regimen as indicated.  His progress is going to be slow  ASSESSMENT / PLAN:   Active issues:  Ischemic bowel, s/p resection with hemicolectomy and ileostomy; now status post abdominal washout several times Severe protein calorie malnutrition Plan For bedside debridement and wound VAC change 6/7 Continue tube feeds Additional wound care  per surgery  Acute respiratory failure with hypoxemia with acute pulmonary edema  Status post tracheostomy 5/24 H/O COPD Portable chest x-ray review:Support tubes and lines in satisfactory position.  Chest x-ray essentially clear He appears to want high tidal volumes on PSV of 12 volumes in excess of 700 ml. Plan Continue full ventilator support Will try to transition to precedex and PRN fent Start PSV during day w/ mandatory full vent support  Hypotension.  Favor sedation related History of coronary artery disease, prior MI, hypertension and peripheral vascular disease Plan Continue telemetry monitoring Keep euvolemic to negative as long as remains normotensive   Acute kidney injury, anasarca Serum creatinine stabilized He is 14 L  positive Plan Avoid hypotension Renal dose medications Strict intake output Daily assessment of volume status, ideally needs diuresis, but spiking fevers and a little concerned about hypotension following dressing change so we will hold off for now  Acute encephalopathy w/ hypoactive delirium Plan Change to precedex  Correct water balance Lights on during day; off at night PRN fent for pain  Fluid and electrolyte imbalance: Mild hyperchloremia, resolved hypernatremia Plan Continue dose free water Replace additional electrolytes as indicated  Anemia of critical illness -No evidence of active bleeding currently Plan Trend CBC Transfuse per protocol for hemoglobin less than 7  Mild leukocytosis, and temperature Maximum temperature 101, white blood cell count stable.  Suspect Sirs reaction; however is at risk for abdominal wound infection Plan Trend CBC and fever curve Also sending LA and PCT  Wound VAC changed today  Hyperglycemia Glycemic control is excellent Plan Continue sliding scale insulin  DVT prophylaxis: scd SUP: H2B Diet: tubefeed Activity: BR Disposition : ICU  FAMILY  - Updates: 09/25/2017 no family at bedside   Erick Colace ACNP-BC [place] Pager # [phone] OR # [phone] if no answer

## 2017-09-26 NOTE — Op Note (Signed)
Preoperative diagnosis: open abdominal wound  Postoperative diagnosis: same   Procedure: placement of 350cm^2 negative pressure dressing  Surgeon: Gurney Maxin, M.D.  Asst: Jackson Latino, M.D.  Anesthesia: general  Indications for procedure: Luke Kaufman is a 67 y.o. year old male with open abdomen that was closed with bridging temporary mesh presents in the ICU with need for vac change.  Description of procedure: Previous wound vac was removed, the mesh was in position, there was some fibrinous tissue at the edges which was removed. The wound was 35cm by 20cm. Due to abdominal girth, there was no opportunity to close the skin defect at this time. 2 large black sponges were cut to the size of the wound and the covering was placed and adhered to the patient's skin in 360 degrees. Vacuum was applied with good seal. The ostomy device was replaced in standard fashion  Findings: small amount fibrinous debris in wound  Specimen: none  Implant: 2 large black sponge vacs   Blood loss: minimal  Local anesthesia: none  Complications: none  Gurney Maxin, M.D. General, Bariatric, & Minimally Invasive Surgery [facility]

## 2017-09-26 NOTE — Progress Notes (Signed)
[facility]/Trauma Progress Note  3 Days Post-Op   Assessment/Plan Principal Problem: Mesenteric ischemia (HCC) Active Problems: Essential hypertension Arterial atherosclerosis HLD (hyperlipidemia) Abdominal pain PUD (peptic ulcer disease) Occlusion of superior mesenteric artery (HCC) Respiratory failure (HCC) Acute respiratory failure with hypoxemia (HCC)  Mesenteric ischemia 1.Exploratory laparotomy right hemicolectomy 09/04/2017 Dr. Stark Klein 2.Abdominal exploration, application of temporary abdominal closure device(ABTHERA), 09/04/2017 Dr. Nadeen Landau 3.Oratory laparotomy, ileostomy, application of open abdominal wound VAC, 09/06/2017 Dr. Georganna Skeans 4. Open abdomen for attempted closure, 09/08/2017 Dr. Judeth Horn 5. Oratory laparotomy with possible wound closure(prior sutures upper&lower portion of the fascia had broken), 09/10/2017 Dr. Judeth Horn 6. Reexploration open abdominal wound, application ofABTHERAWOUND VAC,09/14/2017 Dr. Greer Pickerel 7. Exploratory laparotomy and partial closure of abdomen using the elastic tension closure device (ABRA), Dr. Hulen Skains, 05/29 8. exploratory laparotomy and dressing change, Dr. Ninfa Linden, 05/31 9. Changed negative pressure dressing, Dr. Kieth Brightly, 06/03 10.reopening of recent laparotomy, incisional hernia repair, insertion of mesh, placement of negative pressure dressing, Dr. Kieth Brightly, 06/04 11. Bedside vac change, Dr. Kieth Brightly, 06/07  JSE:GBTD feeds VTE: SCD's VV:OHYW currently Foley:yes Follow up:TBD  DISPO:changed vac today bedside. Next vac change Monday 06/10 at bedside by surgery team. Afebrile. WBC WNL. Will monitor.Okay from a surgical standpoint to initiate precedex and PRN fentanyl.    LOS: 22 days    Subjective: CC: open abdomen  Pt is on the ventilator and sedated.No family at bedside. Will not open eyes or wake.  Objective: Vital signs in last 24  hours: Temp:  [99.5 F (37.5 C)-101 F (38.3 C)] 101 F (38.3 C) (06/07 0720) Pulse Rate:  [106-118] 111 (06/07 0720) Resp:  [6-37] 25 (06/07 0800) BP: (113-145)/(58-78) 138/69 (06/07 0800) SpO2:  [100 %] 100 % (06/07 0800) FiO2 (%):  [40 %] 40 % (06/07 0720) Weight:  [117.1 kg (258 lb 2.5 oz)] 117.1 kg (258 lb 2.5 oz) (06/07 0417) Last BM Date: 09/25/17  Intake/Output from previous day: 06/06 0701 - 06/07 0700 In: 2465 [I.V.:545; NG/GT:1820; IV Piggyback:100] Out: 7371 [Urine:1015; Drains:250; Stool:2650] Intake/Output this shift: No intake/output data recorded.  PE: Gen:On vent, will not awake or FC Card:Mild tachycardia Pulm:On vent GGY:IRSWN vac replaced serosanguinous output. Mesh in place under black sponge. Brown liquid stool in ostomy bag, No response to palpation of abdomen Skin: warm and dry  Anti-infectives: Anti-infectives (From admission, onward)   Start     Dose/Rate Route Frequency Ordered Stop   09/19/17 0730  ceFAZolin (ANCEF) IVPB 2g/100 mL premix     2 g 200 mL/hr over 30 Minutes Intravenous On call to O.R. 09/19/17 0715 09/19/17 1057   09/17/17 1145  ceFAZolin (ANCEF) 3 g in dextrose 5 % 50 mL IVPB     3 g 100 mL/hr over 30 Minutes Intravenous To ShortStay Surgical 09/17/17 0923 09/17/17 1449   09/07/17 1545  cefTRIAXone (ROCEPHIN) 2 g in sodium chloride 0.9 % 100 mL IVPB     2 g 200 mL/hr over 30 Minutes Intravenous Every 24 hours 09/07/17 1543 09/11/17 1605   09/04/17 1400  piperacillin-tazobactam (ZOSYN) IVPB 3.375 g  Status:  Discontinued     3.375 g 12.5 mL/hr over 240 Minutes Intravenous Every 8 hours 09/04/17 1157 09/07/17 1541   09/04/17 1100  ceFAZolin (ANCEF) IVPB 2g/100 mL premix  Status:  Discontinued     2 g 200 mL/hr over 30 Minutes Intravenous Every 8 hours 09/04/17 1035 09/04/17 1157   09/04/17 0630  piperacillin-tazobactam (ZOSYN) IVPB 3.375 g  Status:  Discontinued  3.375 g 100 mL/hr over 30 Minutes Intravenous  Once  09/04/17 0617 09/04/17 0626   09/04/17 0619  piperacillin-tazobactam (ZOSYN) IVPB 3.375 g     3.375 g 100 mL/hr over 30 Minutes Intravenous 60 min pre-op 09/04/17 0620 09/04/17 0717   09/04/17 0030  piperacillin-tazobactam (ZOSYN) IVPB 3.375 g     3.375 g 100 mL/hr over 30 Minutes Intravenous  Once 09/04/17 0024 09/04/17 0144      Lab Results:  Recent Labs    09/24/17 0234 09/25/17 0500  WBC 8.5 8.7  HGB 8.9* 8.8*  HCT 28.9* 28.5*  PLT 256 275   BMET Recent Labs    09/25/17 0500 09/26/17 0543  NA 142 142  K 4.0 3.8  CL 115* 116*  CO2 20* 20*  GLUCOSE 124* 110*  BUN 26* 30*  CREATININE 1.42* 1.41*  CALCIUM 7.7* 7.5*   PT/INR No results for input(s): LABPROT, INR in the last 72 hours. CMP     Component Value Date/Time   NA 142 09/26/2017 0543   K 3.8 09/26/2017 0543   CL 116 (H) 09/26/2017 0543   CO2 20 (L) 09/26/2017 0543   GLUCOSE 110 (H) 09/26/2017 0543   BUN 30 (H) 09/26/2017 0543   CREATININE 1.41 (H) 09/26/2017 0543   CALCIUM 7.5 (L) 09/26/2017 0543   PROT 5.0 (L) 09/26/2017 0543   ALBUMIN 1.2 (L) 09/26/2017 0543   AST 83 (H) 09/26/2017 0543   ALT 39 09/26/2017 0543   ALKPHOS 127 (H) 09/26/2017 0543   BILITOT 0.4 09/26/2017 0543   GFRNONAA 50 (L) 09/26/2017 0543   GFRAA 58 (L) 09/26/2017 0543   Lipase     Component Value Date/Time   LIPASE 37 09/03/2017 1644    Studies/Results: Dg Chest Port 1 View  Result Date: 09/25/2017 CLINICAL DATA:  Tracheostomy. EXAM: PORTABLE CHEST 1 VIEW COMPARISON:  September 24, 2017 FINDINGS: The right PICC line and tracheostomy tube are stable. The NG tube terminates in the stomach. The cardiomediastinal silhouette is stable. The lungs are clear with no focal infiltrate. No pneumothorax. IMPRESSION: Support apparatus as above.  No other abnormalities. Electronically Signed   By: Dorise Bullion III M.D   On: 09/25/2017 09:24      Kalman Drape , [facility] 09/26/2017, 10:14 AM  Pager:  [phone] Mon-Wed, Friday 7:00am-4:30pm Thurs 7am-11:30am  Consults: [phone]

## 2017-09-27 ENCOUNTER — Inpatient Hospital Stay (HOSPITAL_COMMUNITY): Payer: PPO

## 2017-09-27 LAB — GLUCOSE, CAPILLARY
GLUCOSE-CAPILLARY: 117 mg/dL — AB (ref 65–99)
GLUCOSE-CAPILLARY: 127 mg/dL — AB (ref 65–99)
Glucose-Capillary: 106 mg/dL — ABNORMAL HIGH (ref 65–99)
Glucose-Capillary: 112 mg/dL — ABNORMAL HIGH (ref 65–99)
Glucose-Capillary: 112 mg/dL — ABNORMAL HIGH (ref 65–99)
Glucose-Capillary: 126 mg/dL — ABNORMAL HIGH (ref 65–99)

## 2017-09-27 LAB — COMPREHENSIVE METABOLIC PANEL
ALBUMIN: 1.3 g/dL — AB (ref 3.5–5.0)
ALT: 41 U/L (ref 17–63)
ANION GAP: 8 (ref 5–15)
AST: 64 U/L — AB (ref 15–41)
Alkaline Phosphatase: 117 U/L (ref 38–126)
BILIRUBIN TOTAL: 0.6 mg/dL (ref 0.3–1.2)
BUN: 44 mg/dL — ABNORMAL HIGH (ref 6–20)
CHLORIDE: 118 mmol/L — AB (ref 101–111)
CO2: 19 mmol/L — ABNORMAL LOW (ref 22–32)
Calcium: 7.7 mg/dL — ABNORMAL LOW (ref 8.9–10.3)
Creatinine, Ser: 1.69 mg/dL — ABNORMAL HIGH (ref 0.61–1.24)
GFR calc Af Amer: 47 mL/min — ABNORMAL LOW (ref >60)
GFR, EST NON AFRICAN AMERICAN: 40 mL/min — AB (ref >60)
Glucose, Bld: 123 mg/dL — ABNORMAL HIGH (ref 65–99)
Potassium: 4.4 mmol/L (ref 3.5–5.1)
Sodium: 145 mmol/L (ref 135–145)
TOTAL PROTEIN: 5.2 g/dL — AB (ref 6.5–8.1)

## 2017-09-27 LAB — PROCALCITONIN: PROCALCITONIN: 0.47 ng/mL

## 2017-09-27 MED ORDER — HEPARIN SODIUM (PORCINE) 5000 UNIT/ML IJ SOLN
5000.0000 [IU] | Freq: Two times a day (BID) | INTRAMUSCULAR | Status: DC
  Administered 2017-09-27 – 2017-09-30 (×8): 5000 [IU] via SUBCUTANEOUS
  Filled 2017-09-27 (×7): qty 1

## 2017-09-27 MED ORDER — SODIUM BICARBONATE 650 MG PO TABS
650.0000 mg | ORAL_TABLET | Freq: Two times a day (BID) | ORAL | Status: DC
  Administered 2017-09-27 – 2017-09-28 (×3): 650 mg
  Filled 2017-09-27 (×2): qty 1

## 2017-09-27 NOTE — Progress Notes (Signed)
Pt with positional trach. MOV instilled in cuff to try and prevent further leak. Pt is currently achieving set volume. RT will continue to monitor.

## 2017-09-27 NOTE — Progress Notes (Signed)
PULMONARY / CRITICAL CARE MEDICINE   Name: Luke Kaufman MRN: [id number] DOB: 05-13-1950    ADMISSION DATE:  09/03/2017 CONSULTATION DATE:  5/16  REFERRING MD:  Oneida Alar  Interval Hx: Patient is sedated not following commands. Still on ventilator through a tracheostomy. 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 Fascia remains open, agitation barrier to weaning. Trached 5/24., to return to OR 5/26 to place a wound closure device   STUDIES:  CT ab/pelvis 5/15 > portal venous gas in liver, sigmoid colon with diverticular disease, atrophic left kidney  CULTURES: 5/17 resp > e coli resistant to multiple antibiotics 5/17 blood >  negative  ANTIBIOTICS: 5/15 zosyn > 5/19  5/19 ceftriaxone > 5/23   LINES/TUBES: 5/16 R [place] CVL > 5/24 5/16 ETT > 5/24 5/24 Trach 5/18 R femoral arterial line > 5/24 5/24 LUA PICC   SIGNIFICANT EVENTS: 5/16 mesenteric angiogram and aorto to SMA bypass 5/16 R hemicolectomy 5/18 ileostomy 5/19 abdominal washout and partial fascial closure 5/21 TF started, hypoxia with turning  5/22 Return to OR for exploration, attempted closure > unable 5/24 Trach 5/26 OR for debridement and dressing change 5/29 Exploratory laparotomy, placement of ABBRA closure system 5/31 - No events overnight. Plans to return to OR for dressing change    1. Exploratory laparotomy right hemicolectomy 09/04/2017 Dr. Stark Klein 2.Abdominal exploration, application of temporary abdominal closure device(ABTHERA), 09/04/2017 Dr. Nadeen Landau 3.Oratory laparotomy, ileostomy, application of open abdominal wound VAC, 09/06/2017 Dr. Georganna Skeans 4. Open abdomen for attempted closure, 09/08/2017 Dr. Judeth Horn 5. Oratory  laparotomy with possible wound closure(prior sutures upper&lower portion of the fascia had broken), 09/10/2017 Dr. Judeth Horn 6. Reexploration open abdominal wound, application ofABTHERAWOUND VAC,09/14/2017 Dr. Greer Pickerel 7. Exploratory laparotomy and partial closure of abdomen using the elastic tension closure device (ABRA), Dr. Hulen Skains, 05/29 8. exploratory laparotomy and dressing change, Dr. Ninfa Linden, 05/31 9. Changed negative pressure dressing, Dr. Kieth Brightly, 06/03 10. reopening of recent laparotomy, incisional hernia repair, insertion of mesh, placement of negative pressure dressing, Dr. Kieth Brightly, 06/04    SUBJECTIVE/OVERNIGHT/INTERVAL HX Agitated but not interactive and very tachypneic when sedation is held.  Otherwise no subjective change.  On no pressors.   VITAL SIGNS: BP (!) 107/53   Pulse 77   Temp 100.3 F (37.9 C) (Axillary)   Resp (!) 32   Ht 5\' 7"  (1.702 m)   Wt 258 lb 6.1 oz (117.2 kg)   SpO2 100%   BMI 40.47 kg/m   HEMODYNAMICS: CVP:  [7 mmHg-20 mmHg] 11 mmHg  VENTILATOR SETTINGS: Vent Mode: PRVC FiO2 (%):  [40 %] 40 % Set Rate:  [20 bmp] 20 bmp Vt Set:  [530 mL] 530 mL PEEP:  [5 cmH20] 5 cmH20 Plateau Pressure:  [14 cmH20-21 cmH20] 21 cmH20  INTAKE / OUTPUT: I/O last 3 completed shifts: In: 3675.9 [I.V.:915.9; Other:165; PY/KD:9833; IV Piggyback:100] Out: 8250 [Urine:2025; Drains:350; Stool:2550]  Physical exam General: Obese male who is ventilated via tracheostomy.  HEENT normocephalic atraumatic he has a #6 cuffed tracheostomy in place.  There is irritation and excoriation underneath the tracheostomy flange.  We have placed a new protective dressing there Pulmonary: Minute ventilation remains quite high.  There is symmetric air movement, no wheezes very  few scattered rhonchi.   Cardiac: S1 and S2 are regular without murmur rub or gallop there is 1/2+ anasarca.  Abdomen: Abdomen is obese and soft with wound VAC in place.  There is a functioning  ostomy in the right lower quadrant with normal-appearing mucosa.  There is no overt tenderness. GU: Clear yellow Neuro: Not at all interactive with me on 0.6 Precedex.  He moves the right side spontaneously but not the left.      PULMONARY No results for input(s): PHART, PCO2ART, PO2ART, HCO3, TCO2, O2SAT in the last 168 hours.  Invalid input(s): PCO2, PO2  CBC Recent Labs  Lab 09/23/17 0336 09/24/17 0234 09/25/17 0500  HGB 7.0* 8.9* 8.8*  HCT 23.3* 28.9* 28.5*  WBC 8.0 8.5 8.7  PLT 223 256 275    COAGULATION No results for input(s): INR in the last 168 hours.  CARDIAC  No results for input(s): TROPONINI in the last 168 hours. No results for input(s): PROBNP in the last 168 hours.   CHEMISTRY Recent Labs  Lab 09/21/17 0205 09/22/17 0341 09/23/17 0336 09/24/17 0234 09/25/17 0500 09/26/17 0543 09/27/17 0339  NA 142 140 139 140 142 142 145  K 3.8 4.2 4.4 4.3 4.0 3.8 4.4  CL 116* 115* 112* 116* 115* 116* 118*  CO2 22 22 21* 18* 20* 20* 19*  GLUCOSE 96 110* 113* 110* 124* 110* 123*  BUN 23* 21* 26* 23* 26* 30* 44*  CREATININE 1.30* 1.34* 1.32* 1.37* 1.42* 1.41* 1.69*  CALCIUM 7.0* 7.1* 7.3* 6.9* 7.7* 7.5* 7.7*  MG 1.9 2.1 1.9 1.8  --   --   --   PHOS 2.9 3.0 3.2 2.9  --   --   --    Estimated Creatinine Clearance: 51.9 mL/min (A) (by C-G formula based on SCr of 1.69 mg/dL (H)).   LIVER Recent Labs  Lab 09/22/17 0341 09/26/17 0543 09/27/17 0339  AST 25 83* 64*  ALT 20 39 41  ALKPHOS 67 127* 117  BILITOT 0.3 0.4 0.6  PROT 4.5* 5.0* 5.2*  ALBUMIN 1.3* 1.2* 1.3*     INFECTIOUS Recent Labs  Lab 09/26/17 1133 09/27/17 0339  LATICACIDVEN 1.4  --   PROCALCITON 0.59 0.47     ENDOCRINE CBG (last 3)  Recent Labs    09/27/17 0358 09/27/17 0746 09/27/17 1221  GLUCAP 117* 112* 112*     IMAGING x48h  - image(s) personally visualized  -   highlighted in bold No results found.   DISCUSSION: 67 y/o male with an SMA occlusion requiring emergent  bypass and eventually R hemicolectomy and ilestomy is gone back to the operating room several times for washouts as well as working on abdominal closure.  Most recently had mesh placed.  He is been on IV sedation for a prolonged period of time and is at a very high risk for prolonged encephalopathy.  ASSESSMENT / PLAN:   Active issues:  Ischemic bowel, s/p resection with hemicolectomy and ileostomy; now status post abdominal washout several times Severe protein calorie malnutrition Plan Continue tube feeds Additional wound care per surgery  Acute respiratory failure with hypoxemia with acute pulmonary edema  Status post tracheostomy 5/24 H/O COPD Minute ventilation remains extremely high.  He has a non-gap acidosis which I suspect may in part be related to his ileostomy.  I have started him on some enteral bicarbonate to correct this.  I will be obtaining a chest x-ray in the morning to ensure that we do not have other pathology  contributing to the high minute ventilation.  Abdominal exam is benign today not suggesting that an intra-abdominal process is responsible for the high minute volume.  I will be obtaining a gas in the morning and if the AA gradient is wider than one would expect from the findings on the chest x-ray we may need to evaluate for thromboembolic disease.  I have initiated subcu heparin for DVT prophylaxis today  Plan Continue full ventilator support Will try to transition to precedex and PRN fent Start PSV during day w/ mandatory full vent support  Hypotension.  Favor sedation related History of coronary artery disease, prior MI, hypertension and peripheral vascular disease Plan Continue telemetry monitoring Keep euvolemic to negative as long as remains normotensive   Acute kidney injury, anasarca Serum creatinine stabilized He is 14 L positive Plan Avoid hypotension Renal dose medications Strict intake output Daily assessment of volume status, ideally needs  diuresis, but spiking fevers and a little concerned about hypotension following dressing change so we will hold off for now  Acute encephalopathy w/ hypoactive delirium Plan Change to precedex  Correct water balance Lights on during day; off at night PRN fent for pain  Fluid and electrolyte imbalance: Mild hyperchloremia, resolved hypernatremia Plan Continue dose free water Replace additional electrolytes as indicated  Anemia of critical illness -No evidence of active bleeding currently Plan Trend CBC Transfuse per protocol for hemoglobin less than 7  Mild leukocytosis, and temperature Maximum temperature 101, white blood cell count stable.  Suspect Sirs reaction; however is at risk for abdominal wound infection Plan Trend CBC and fever curve Also sending LA and PCT  Wound VAC changed today  Hyperglycemia Glycemic control is excellent Plan Continue sliding scale insulin  DVT prophylaxis: scd SUP: H2B Diet: tubefeed Activity: BR Disposition : ICU  FAMILY  - Updates: 09/25/2017 no family at bedside   Lars Masson, MD [place]

## 2017-09-27 NOTE — Progress Notes (Signed)
Patient ID: Luke Kaufman, male   DOB: 1950/06/06, 67 y.o.   MRN: [id number]    4 Days Post-Op  Subjective: Pt on trach.  Waking up and moves right arm some, but does not follow commands.  Tolerating TFs well with a lot of ileostomy output.  No issues with VAC.  Objective: Vital signs in last 24 hours: Temp:  [97.9 F (36.6 C)-101.3 F (38.5 C)] 98.6 F (37 C) (06/08 0400) Pulse Rate:  [63-100] 63 (06/08 0552) Resp:  [15-33] 25 (06/08 0552) BP: (95-156)/(36-85) 111/49 (06/08 0552) SpO2:  [100 %] 100 % (06/08 0732) FiO2 (%):  [40 %] 40 % (06/08 0732) Weight:  [117.2 kg (258 lb 6.1 oz)] 117.2 kg (258 lb 6.1 oz) (06/08 0300) Last BM Date: 09/25/17  Intake/Output from previous day: 06/07 0701 - 06/08 0700 In: 2605.9 [I.V.:555.9; CW/CB:7628; IV Piggyback:50] Out: 2960 [Urine:1410; Drains:200; BTDVV:6160] Intake/Output this shift: No intake/output data recorded.  PE: Abd: soft, obese, abdominal wound VAC in place with some serous drainage noted.  RLQ ileostomy with tan feculent output, 1350cc yesterday.  Stoma is pink and viable  Lab Results:  Recent Labs    09/25/17 0500  WBC 8.7  HGB 8.8*  HCT 28.5*  PLT 275   BMET Recent Labs    09/26/17 0543 09/27/17 0339  NA 142 145  K 3.8 4.4  CL 116* 118*  CO2 20* 19*  GLUCOSE 110* 123*  BUN 30* 44*  CREATININE 1.41* 1.69*  CALCIUM 7.5* 7.7*   PT/INR No results for input(s): LABPROT, INR in the last 72 hours. CMP     Component Value Date/Time   NA 145 09/27/2017 0339   K 4.4 09/27/2017 0339   CL 118 (H) 09/27/2017 0339   CO2 19 (L) 09/27/2017 0339   GLUCOSE 123 (H) 09/27/2017 0339   BUN 44 (H) 09/27/2017 0339   CREATININE 1.69 (H) 09/27/2017 0339   CALCIUM 7.7 (L) 09/27/2017 0339   PROT 5.2 (L) 09/27/2017 0339   ALBUMIN 1.3 (L) 09/27/2017 0339   AST 64 (H) 09/27/2017 0339   ALT 41 09/27/2017 0339   ALKPHOS 117 09/27/2017 0339   BILITOT 0.6 09/27/2017 0339   GFRNONAA 40 (L) 09/27/2017 0339   GFRAA 47 (L)  09/27/2017 0339   Lipase     Component Value Date/Time   LIPASE 37 09/03/2017 1644       Studies/Results: No results found.  Anti-infectives: Anti-infectives (From admission, onward)   Start     Dose/Rate Route Frequency Ordered Stop   09/19/17 0730  ceFAZolin (ANCEF) IVPB 2g/100 mL premix     2 g 200 mL/hr over 30 Minutes Intravenous On call to O.R. 09/19/17 0715 09/19/17 1057   09/17/17 1145  ceFAZolin (ANCEF) 3 g in dextrose 5 % 50 mL IVPB     3 g 100 mL/hr over 30 Minutes Intravenous To ShortStay Surgical 09/17/17 0923 09/17/17 1449   09/07/17 1545  cefTRIAXone (ROCEPHIN) 2 g in sodium chloride 0.9 % 100 mL IVPB     2 g 200 mL/hr over 30 Minutes Intravenous Every 24 hours 09/07/17 1543 09/11/17 1605   09/04/17 1400  piperacillin-tazobactam (ZOSYN) IVPB 3.375 g  Status:  Discontinued     3.375 g 12.5 mL/hr over 240 Minutes Intravenous Every 8 hours 09/04/17 1157 09/07/17 1541   09/04/17 1100  ceFAZolin (ANCEF) IVPB 2g/100 mL premix  Status:  Discontinued     2 g 200 mL/hr over 30 Minutes Intravenous Every 8 hours 09/04/17 1035 09/04/17 1157  09/04/17 0630  piperacillin-tazobactam (ZOSYN) IVPB 3.375 g  Status:  Discontinued     3.375 g 100 mL/hr over 30 Minutes Intravenous  Once 09/04/17 0617 09/04/17 0626   09/04/17 0619  piperacillin-tazobactam (ZOSYN) IVPB 3.375 g     3.375 g 100 mL/hr over 30 Minutes Intravenous 60 min pre-op 09/04/17 0620 09/04/17 0717   09/04/17 0030  piperacillin-tazobactam (ZOSYN) IVPB 3.375 g     3.375 g 100 mL/hr over 30 Minutes Intravenous  Once 09/04/17 0024 09/04/17 0144       Assessment/Plan Principal Problem: Mesenteric ischemia (HCC) Active Problems: Essential hypertension Arterial atherosclerosis HLD (hyperlipidemia) Abdominal pain PUD (peptic ulcer disease) Occlusion of superior mesenteric artery (HCC) Respiratory failure ([place]) Acute respiratory failure with hypoxemia (HCC)  Mesenteric  ischemia 1.Exploratory laparotomy right hemicolectomy 09/04/2017 Dr. Stark Klein 2.Abdominal exploration, application of temporary abdominal closure device(ABTHERA), 09/04/2017 Dr. Nadeen Landau 3.Oratory laparotomy, ileostomy, application of open abdominal wound VAC, 09/06/2017 Dr. Georganna Skeans 4. Open abdomen for attempted closure, 09/08/2017 Dr. Judeth Horn 5. Oratory laparotomy with possible wound closure(prior sutures upper&lower portion of the fascia had broken), 09/10/2017 Dr. Judeth Horn 6. Reexploration open abdominal wound, application ofABTHERAWOUND VAC,09/14/2017 Dr. Greer Pickerel 7. Exploratory laparotomy and partial closure of abdomen using the elastic tension closure device (ABRA), Dr. Hulen Skains, 05/29 8. exploratory laparotomy and dressing change, Dr. Ninfa Linden, 05/31 9. Changed negative pressure dressing, Dr. Kieth Brightly, 06/03 10.reopening of recent laparotomy, incisional hernia repair, insertion of mesh, placement of negative pressure dressing, Dr. Kieth Brightly, 06/04 11. Bedside vac change, Dr. Kieth Brightly, 06/07  -had fever of 101 overnight, responded to tylenol.  Follow.  Will get cbc in am. -watch ostomy output.  It came down from 2900 to 1300 yesterday.  Cont to monitor and see where output goes.  If it gets much higher again, may need to add some anti-diarrheals to minimize dehydration from this site.  Patient on colace. Will stop this.  May need increase in his maintenance fluids as cr bump some to 1.69 today and he has been negative for the last several days.  SWH:QPRF feeds VTE: SCD's FM:BWGY currently Foley:yes Follow up:TBD  DISPO:Next vac change Monday 06/10 at bedside by surgery team. watch ostomy output and fluid balance along with creatinine.  May need anti-diarrheal, but will start with stopping colace today.     LOS: 23 days    Henreitta Cea , [facility] Surgery 09/27/2017, 7:57 AM Pager: [phone]

## 2017-09-28 LAB — COMPREHENSIVE METABOLIC PANEL
ALT: 38 U/L (ref 17–63)
AST: 56 U/L — AB (ref 15–41)
Albumin: 1.3 g/dL — ABNORMAL LOW (ref 3.5–5.0)
Alkaline Phosphatase: 107 U/L (ref 38–126)
Anion gap: 8 (ref 5–15)
BUN: 41 mg/dL — AB (ref 6–20)
CHLORIDE: 117 mmol/L — AB (ref 101–111)
CO2: 21 mmol/L — AB (ref 22–32)
CREATININE: 1.3 mg/dL — AB (ref 0.61–1.24)
Calcium: 7.6 mg/dL — ABNORMAL LOW (ref 8.9–10.3)
GFR calc Af Amer: >60 mL/min (ref >60)
GFR calc non Af Amer: 55 mL/min — ABNORMAL LOW (ref >60)
Glucose, Bld: 132 mg/dL — ABNORMAL HIGH (ref 65–99)
Potassium: 3.8 mmol/L (ref 3.5–5.1)
SODIUM: 146 mmol/L — AB (ref 135–145)
Total Bilirubin: 0.5 mg/dL (ref 0.3–1.2)
Total Protein: 5.3 g/dL — ABNORMAL LOW (ref 6.5–8.1)

## 2017-09-28 LAB — CBC WITH DIFFERENTIAL/PLATELET
Abs Immature Granulocytes: 0.1 10*3/uL (ref 0.0–0.1)
BASOS ABS: 0 10*3/uL (ref 0.0–0.1)
BASOS PCT: 0 %
EOS ABS: 0.2 10*3/uL (ref 0.0–0.7)
Eosinophils Relative: 2 %
HCT: 28.4 % — ABNORMAL LOW (ref 39.0–52.0)
Hemoglobin: 8.6 g/dL — ABNORMAL LOW (ref 13.0–17.0)
IMMATURE GRANULOCYTES: 1 %
Lymphocytes Relative: 11 %
Lymphs Abs: 1.1 10*3/uL (ref 0.7–4.0)
MCH: 27.6 pg (ref 26.0–34.0)
MCHC: 30.3 g/dL (ref 30.0–36.0)
MCV: 91 fL (ref 78.0–100.0)
MONOS PCT: 7 %
Monocytes Absolute: 0.6 10*3/uL (ref 0.1–1.0)
NEUTROS ABS: 7.3 10*3/uL (ref 1.7–7.7)
NEUTROS PCT: 79 %
PLATELETS: 286 10*3/uL (ref 150–400)
RBC: 3.12 MIL/uL — ABNORMAL LOW (ref 4.22–5.81)
RDW: 15 % (ref 11.5–15.5)
WBC: 9.3 10*3/uL (ref 4.0–10.5)

## 2017-09-28 LAB — BLOOD GAS, ARTERIAL
ACID-BASE DEFICIT: 3.5 mmol/L — AB (ref 0.0–2.0)
Bicarbonate: 19.5 mmol/L — ABNORMAL LOW (ref 20.0–28.0)
DRAWN BY: 330991
FIO2: 0.4
O2 Saturation: 97.6 %
PEEP: 5 cmH2O
Patient temperature: 99.2
RATE: 20 resp/min
VT: 530 mL
pCO2 arterial: 27.6 mmHg — ABNORMAL LOW (ref 32.0–48.0)
pH, Arterial: 7.465 — ABNORMAL HIGH (ref 7.350–7.450)
pO2, Arterial: 102 mmHg (ref 83.0–108.0)

## 2017-09-28 LAB — GLUCOSE, CAPILLARY
GLUCOSE-CAPILLARY: 114 mg/dL — AB (ref 65–99)
GLUCOSE-CAPILLARY: 117 mg/dL — AB (ref 65–99)
GLUCOSE-CAPILLARY: 122 mg/dL — AB (ref 65–99)
Glucose-Capillary: 117 mg/dL — ABNORMAL HIGH (ref 65–99)
Glucose-Capillary: 115 mg/dL — ABNORMAL HIGH (ref 65–99)
Glucose-Capillary: 117 mg/dL — ABNORMAL HIGH (ref 65–99)
Glucose-Capillary: 122 mg/dL — ABNORMAL HIGH (ref 65–99)

## 2017-09-28 LAB — PROCALCITONIN: Procalcitonin: 0.31 ng/mL

## 2017-09-28 MED ORDER — ALBUMIN HUMAN 25 % IV SOLN
25.0000 g | Freq: Four times a day (QID) | INTRAVENOUS | Status: DC
  Administered 2017-09-28 – 2017-09-30 (×10): 25 g via INTRAVENOUS
  Filled 2017-09-28 (×2): qty 50
  Filled 2017-09-28: qty 100
  Filled 2017-09-28 (×3): qty 50
  Filled 2017-09-28 (×2): qty 100
  Filled 2017-09-28 (×2): qty 50
  Filled 2017-09-28: qty 100
  Filled 2017-09-28: qty 50

## 2017-09-28 MED ORDER — FUROSEMIDE 10 MG/ML IJ SOLN
10.0000 mg | Freq: Two times a day (BID) | INTRAMUSCULAR | Status: DC
  Administered 2017-09-28 – 2017-09-30 (×4): 10 mg via INTRAVENOUS
  Filled 2017-09-28 (×4): qty 2

## 2017-09-28 NOTE — Progress Notes (Signed)
Patient ID: Luke Kaufman, male   DOB: 1950/11/24, 67 y.o.   MRN: [id number]    5 Days Post-Op  Subjective: Patient not weaning well.  Still doesn't follow commands.  No further fevers overnight.  Objective: Vital signs in last 24 hours: Temp:  [98.8 F (37.1 C)-100.3 F (37.9 C)] 98.8 F (37.1 C) (06/09 0330) Pulse Rate:  [64-77] 70 (06/09 0741) Resp:  [23-40] 32 (06/09 0700) BP: (104-129)/(42-69) 124/57 (06/09 0741) SpO2:  [100 %] 100 % (06/09 0741) FiO2 (%):  [40 %] 40 % (06/09 0741) Weight:  [113.2 kg (249 lb 9 oz)] 113.2 kg (249 lb 9 oz) (06/09 0208) Last BM Date: 09/25/17  Intake/Output from previous day: 06/08 0701 - 06/09 0700 In: 2816.4 [I.V.:736.4; NG/GT:1980; IV Piggyback:100] Out: 3790 [Urine:1675; Drains:400; WIOXB:3532] Intake/Output this shift: No intake/output data recorded.  PE: Abd: soft, wound seems to have separated more since yesterday as more mesh is visible and the sponge is more drawn in.  Some purulent appearing fluid on the edges of the wound. VAC output has changed from serous to thing brown cloudy drainage.  Ileostomy in place with 1100cc of output documented yesterday.  Lab Results:  Recent Labs    09/28/17 0359  WBC 9.3  HGB 8.6*  HCT 28.4*  PLT 286   BMET Recent Labs    09/27/17 0339 09/28/17 0359  NA 145 146*  K 4.4 3.8  CL 118* 117*  CO2 19* 21*  GLUCOSE 123* 132*  BUN 44* 41*  CREATININE 1.69* 1.30*  CALCIUM 7.7* 7.6*   PT/INR No results for input(s): LABPROT, INR in the last 72 hours. CMP     Component Value Date/Time   NA 146 (H) 09/28/2017 0359   K 3.8 09/28/2017 0359   CL 117 (H) 09/28/2017 0359   CO2 21 (L) 09/28/2017 0359   GLUCOSE 132 (H) 09/28/2017 0359   BUN 41 (H) 09/28/2017 0359   CREATININE 1.30 (H) 09/28/2017 0359   CALCIUM 7.6 (L) 09/28/2017 0359   PROT 5.3 (L) 09/28/2017 0359   ALBUMIN 1.3 (L) 09/28/2017 0359   AST 56 (H) 09/28/2017 0359   ALT 38 09/28/2017 0359   ALKPHOS 107 09/28/2017 0359   BILITOT 0.5 09/28/2017 0359   GFRNONAA 55 (L) 09/28/2017 0359   GFRAA >60 09/28/2017 0359   Lipase     Component Value Date/Time   LIPASE 37 09/03/2017 1644       Studies/Results: Dg Chest Port 1 View  Result Date: 09/27/2017 CLINICAL DATA:  Respiratory failure EXAM: PORTABLE CHEST 1 VIEW COMPARISON:  09/25/2017 chest radiograph. FINDINGS: Tracheostomy tube tip terminates over the tracheal air column at the level of the thoracic inlet. Left PICC terminates at the cavoatrial junction. Enteric tube terminates in the gastric fundus. Stable cardiomediastinal silhouette with normal heart size. No pneumothorax. No pleural effusion. Mild bibasilar atelectasis. No pulmonary edema. No acute consolidative airspace disease. IMPRESSION: Support structures as detailed.  Mild bibasilar atelectasis. Electronically Signed   By: Ilona Sorrel M.D.   On: 09/27/2017 16:59    Anti-infectives: Anti-infectives (From admission, onward)   Start     Dose/Rate Route Frequency Ordered Stop   09/19/17 0730  ceFAZolin (ANCEF) IVPB 2g/100 mL premix     2 g 200 mL/hr over 30 Minutes Intravenous On call to O.R. 09/19/17 0715 09/19/17 1057   09/17/17 1145  ceFAZolin (ANCEF) 3 g in dextrose 5 % 50 mL IVPB     3 g 100 mL/hr over 30 Minutes Intravenous To ShortStay  Surgical 09/17/17 0923 09/17/17 1449   09/07/17 1545  cefTRIAXone (ROCEPHIN) 2 g in sodium chloride 0.9 % 100 mL IVPB     2 g 200 mL/hr over 30 Minutes Intravenous Every 24 hours 09/07/17 1543 09/11/17 1605   09/04/17 1400  piperacillin-tazobactam (ZOSYN) IVPB 3.375 g  Status:  Discontinued     3.375 g 12.5 mL/hr over 240 Minutes Intravenous Every 8 hours 09/04/17 1157 09/07/17 1541   09/04/17 1100  ceFAZolin (ANCEF) IVPB 2g/100 mL premix  Status:  Discontinued     2 g 200 mL/hr over 30 Minutes Intravenous Every 8 hours 09/04/17 1035 09/04/17 1157   09/04/17 0630  piperacillin-tazobactam (ZOSYN) IVPB 3.375 g  Status:  Discontinued     3.375 g 100 mL/hr  over 30 Minutes Intravenous  Once 09/04/17 0617 09/04/17 0626   09/04/17 0619  piperacillin-tazobactam (ZOSYN) IVPB 3.375 g     3.375 g 100 mL/hr over 30 Minutes Intravenous 60 min pre-op 09/04/17 0620 09/04/17 0717   09/04/17 0030  piperacillin-tazobactam (ZOSYN) IVPB 3.375 g     3.375 g 100 mL/hr over 30 Minutes Intravenous  Once 09/04/17 0024 09/04/17 0144       Assessment/Plan Principal Problem: Mesenteric ischemia (HCC) Active Problems: Essential hypertension Arterial atherosclerosis HLD (hyperlipidemia) Abdominal pain PUD (peptic ulcer disease) Occlusion of superior mesenteric artery (HCC) Respiratory failure (HCC) Acute respiratory failure with hypoxemia (HCC)  Mesenteric ischemia 1.Exploratory laparotomy right hemicolectomy 09/04/2017 Dr. Stark Klein 2.Abdominal exploration, application of temporary abdominal closure device(ABTHERA), 09/04/2017 Dr. Nadeen Landau 3.Oratory laparotomy, ileostomy, application of open abdominal wound VAC, 09/06/2017 Dr. Georganna Skeans 4. Open abdomen for attempted closure, 09/08/2017 Dr. Judeth Horn 5. Oratory laparotomy with possible wound closure(prior sutures upper&lower portion of the fascia had broken), 09/10/2017 Dr. Judeth Horn 6. Reexploration open abdominal wound, application ofABTHERAWOUND VAC,09/14/2017 Dr. Greer Pickerel 7. Exploratory laparotomy and partial closure of abdomen using the elastic tension closure device (ABRA), Dr. Hulen Skains, 05/29 8. exploratory laparotomy and dressing change, Dr. Ninfa Linden, 05/31 9. Changed negative pressure dressing, Dr. Kieth Brightly, 06/03 10.reopening of recent laparotomy, incisional hernia repair, insertion of mesh, placement of negative pressure dressing, Dr. Kieth Brightly, 06/04 11. Bedside vac change, Dr. Kieth Brightly, 06/07  -no further fevers and WBC normal, but output from [facility] changing to more thing brown cloudy appearing output and increase expansion of his wound  slightly since yesterday.  Plan for [facility] change tomorrow. -watch ostomy output.  It came down from 2900 to 1300 to 1100 with stopping his colace.  Cont to monitor and see where output goes.  If it goes back up or persists with negative fluid balances, then may add some anti-diarrheal.  DPO:EUMP feeds VTE: SCD's NT:IRWE currently Foley:yes Follow up:TBD  DISPO:Nextvac changeMonday 06/10at bedside by surgery team. Will eval output changes with VAC change tomorrow. watch ostomy output and fluid balance.  May need anti-diarrheal, but will watch for now.    LOS: 24 days    Henreitta Cea , [facility] Surgery 09/28/2017, 7:48 AM Pager: [phone]

## 2017-09-28 NOTE — Progress Notes (Signed)
PULMONARY / CRITICAL CARE MEDICINE   Name: Luke Kaufman MRN: [id number] DOB: 07-25-1950    ADMISSION DATE:  09/03/2017 CONSULTATION DATE:  5/16  REFERRING MD:  Oneida Alar  Interval Hx: [name] remains clinically ventilated via tracheostomy.  He is on no pressors.  He is sedated on Precedex and not at all interactive.  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 Fascia remains open, agitation barrier to weaning. Trached 5/24., to return to OR 5/26 to place a wound closure device   STUDIES:  CT ab/pelvis 5/15 > portal venous gas in liver, sigmoid colon with diverticular disease, atrophic left kidney  CULTURES: 5/17 resp > e coli resistant to multiple antibiotics 5/17 blood >  negative  ANTIBIOTICS: 5/15 zosyn > 5/19  5/19 ceftriaxone > 5/23   LINES/TUBES: 5/16 R [place] CVL > 5/24 5/16 ETT > 5/24 5/24 Trach 5/18 R femoral arterial line > 5/24 5/24 LUA PICC   SIGNIFICANT EVENTS: 5/16 mesenteric angiogram and aorto to SMA bypass 5/16 R hemicolectomy 5/18 ileostomy 5/19 abdominal washout and partial fascial closure 5/21 TF started, hypoxia with turning  5/22 Return to OR for exploration, attempted closure > unable 5/24 Trach 5/26 OR for debridement and dressing change 5/29 Exploratory laparotomy, placement of ABBRA closure system 5/31 - No events overnight. Plans to return to OR for dressing change    1. Exploratory laparotomy right hemicolectomy 09/04/2017 Dr. Stark Klein 2.Abdominal exploration, application of temporary abdominal closure device(ABTHERA), 09/04/2017 Dr. Nadeen Landau 3.Oratory laparotomy, ileostomy, application of open abdominal wound VAC, 09/06/2017 Dr. Georganna Skeans 4. Open abdomen for attempted closure, 09/08/2017 Dr. Judeth Horn 5. Oratory  laparotomy with possible wound closure(prior sutures upper&lower portion of the fascia had broken), 09/10/2017 Dr. Judeth Horn 6. Reexploration open abdominal wound, application ofABTHERAWOUND VAC,09/14/2017 Dr. Greer Pickerel 7. Exploratory laparotomy and partial closure of abdomen using the elastic tension closure device (ABRA), Dr. Hulen Skains, 05/29 8. exploratory laparotomy and dressing change, Dr. Ninfa Linden, 05/31 9. Changed negative pressure dressing, Dr. Kieth Brightly, 06/03 10. reopening of recent laparotomy, incisional hernia repair, insertion of mesh, placement of negative pressure dressing, Dr. Kieth Brightly, 06/04    SUBJECTIVE/OVERNIGHT/INTERVAL HX No events overnight.     VITAL SIGNS: BP (!) 118/59   Pulse 71   Temp 99.1 F (37.3 C) (Oral)   Resp (!) 31   Ht 5\' 7"  (1.702 m)   Wt 249 lb 9 oz (113.2 kg)   SpO2 100%   BMI 39.09 kg/m   HEMODYNAMICS: CVP:  [5 mmHg-16 mmHg] 6 mmHg  VENTILATOR SETTINGS: Vent Mode: PRVC FiO2 (%):  [40 %] 40 % Set Rate:  [20 bmp] 20 bmp Vt Set:  [530 mL] 530 mL PEEP:  [5 cmH20] 5 cmH20 Plateau Pressure:  [10 cmH20-13 cmH20] 13 cmH20  INTAKE / OUTPUT: I/O last 3 completed shifts: In: 3671.4 [I.V.:836.4; Other:135; NG/GT:2600; IV Piggyback:100] Out: 9924 [QASTM:1962; Drains:550; Stool:1700]  Physical exam General: Obese male who is breathing comfortably while on mechanical ventilation via tracheostomy.   HEENT normocephalic atraumatic he has a #6 cuffed tracheostomy in place.  There is irritation and excoriation underneath the tracheostomy flange.  The tracheostomy incision is deep and nonhealing but there is no purulence.  Pulmonary: Minute ventilation is a little lower today.  There is symmetric air movement, no wheezes and a few scattered rhonchi.  Minute ventilation remains quite high.  There is symmetric air movement, no wheezes  Cardiac: S1 and S2 are regular without murmur rub or gallop.  There continues to be 1/2-1 anasarca    Abdomen: The  abdomen is obese and soft with a wound VAC in place.  There is a functioning ostomy in the right lower quadrant and the mucosa appears normal.  There is no overt tenderness.   GU: Clear yellow Neuro: Not at all interactive with me on 0.6 Precedex.  He moves the right side spontaneously but not the left.      PULMONARY Recent Labs  Lab 09/28/17 0359  PHART 7.465*  PCO2ART 27.6*  PO2ART 102  HCO3 19.5*  O2SAT 97.6    CBC Recent Labs  Lab 09/24/17 0234 09/25/17 0500 09/28/17 0359  HGB 8.9* 8.8* 8.6*  HCT 28.9* 28.5* 28.4*  WBC 8.5 8.7 9.3  PLT 256 275 286    COAGULATION No results for input(s): INR in the last 168 hours.  CARDIAC  No results for input(s): TROPONINI in the last 168 hours. No results for input(s): PROBNP in the last 168 hours.   CHEMISTRY Recent Labs  Lab 09/22/17 0341 09/23/17 0336 09/24/17 0234 09/25/17 0500 09/26/17 0543 09/27/17 0339 09/28/17 0359  NA 140 139 140 142 142 145 146*  K 4.2 4.4 4.3 4.0 3.8 4.4 3.8  CL 115* 112* 116* 115* 116* 118* 117*  CO2 22 21* 18* 20* 20* 19* 21*  GLUCOSE 110* 113* 110* 124* 110* 123* 132*  BUN 21* 26* 23* 26* 30* 44* 41*  CREATININE 1.34* 1.32* 1.37* 1.42* 1.41* 1.69* 1.30*  CALCIUM 7.1* 7.3* 6.9* 7.7* 7.5* 7.7* 7.6*  MG 2.1 1.9 1.8  --   --   --   --   PHOS 3.0 3.2 2.9  --   --   --   --    Estimated Creatinine Clearance: 66.2 mL/min (A) (by C-G formula based on SCr of 1.3 mg/dL (H)).   LIVER Recent Labs  Lab 09/22/17 0341 09/26/17 0543 09/27/17 0339 09/28/17 0359  AST 25 83* 64* 56*  ALT 20 39 41 38  ALKPHOS 67 127* 117 107  BILITOT 0.3 0.4 0.6 0.5  PROT 4.5* 5.0* 5.2* 5.3*  ALBUMIN 1.3* 1.2* 1.3* 1.3*     INFECTIOUS Recent Labs  Lab 09/26/17 1133 09/27/17 0339 09/28/17 0359  LATICACIDVEN 1.4  --   --   PROCALCITON 0.59 0.47 0.31     ENDOCRINE CBG (last 3)  Recent Labs    09/28/17 0323 09/28/17 0749 09/28/17 1154  GLUCAP 114* 117* 115*     IMAGING x48h  - image(s)  personally visualized  -   highlighted in bold Dg Chest Port 1 View  Result Date: 09/27/2017 CLINICAL DATA:  Respiratory failure EXAM: PORTABLE CHEST 1 VIEW COMPARISON:  09/25/2017 chest radiograph. FINDINGS: Tracheostomy tube tip terminates over the tracheal air column at the level of the thoracic inlet. Left PICC terminates at the cavoatrial junction. Enteric tube terminates in the gastric fundus. Stable cardiomediastinal silhouette with normal heart size. No pneumothorax. No pleural effusion. Mild bibasilar atelectasis. No pulmonary edema. No acute consolidative airspace disease. IMPRESSION: Support structures as detailed.  Mild bibasilar atelectasis. Electronically Signed   By: Ilona Sorrel M.D.   On: 09/27/2017 16:59     DISCUSSION: 67 y/o male with an SMA occlusion requiring emergent bypass and eventually R hemicolectomy and ilestomy is gone back to the operating room several times for washouts as well as  working on abdominal closure.  Most recently had mesh placed.  He is been on IV sedation for a prolonged period of time and is at a very high risk for prolonged encephalopathy.  ASSESSMENT / PLAN:   Active issues:  Ischemic bowel, s/p resection with hemicolectomy and ileostomy; now status post abdominal washout several times Severe protein calorie malnutrition Plan Continue tube feeds Additional wound care per surgery  Acute respiratory failure with hypoxemia with acute pulmonary edema  Status post tracheostomy 5/24 H/O COPD Minute ventilation has come down somewhat with correction of his non-gap acidosis.  We continue to have issues related to his pickwickian body habitus as well as to his anasarca both of which are contributing to abdominal pressure on the diaphragms.  I have initiated a combination of albumin and Lasix today to address the latter.  Hypotension.  He is no longer requiring pressors.   History of coronary artery disease, prior MI, hypertension and peripheral vascular  disease Plan Continue telemetry monitoring Keep euvolemic to negative as long as remains normotensive Add a low-dose statin   Acute kidney injury, anasarca Pain is slowly improving down to 1.3 today.   As noted I have started a combination of albumin and Lasix, he will require careful attention to his creatinine to ensure that we do not make him prerenal   Acute encephalopathy w/ hypoactive delirium Plan Change to precedex  Correct water balance Lights on during day; off at night PRN fent for pain  Fluid and electrolyte imbalance: Mild hyperchloremia, resolved hypernatremia Plan Continue dose free water Replace additional electrolytes as indicated  Anemia of critical illness -No evidence of active bleeding currently Plan Trend CBC Transfuse per protocol for hemoglobin less than 7  Mild leukocytosis, and temperature Maximum temperature 101, white blood cell count stable.  Suspect Sirs reaction; however is at risk for abdominal wound infection Plan Trend CBC and fever curve Also sending LA and PCT  Wound VAC changed today  Hyperglycemia Glycemic control is excellent Plan Continue sliding scale insulin  DVT prophylaxis: scd SUP: H2B Diet: tubefeed Activity: BR Disposition : ICU  FAMILY  - Updates: 09/25/2017 no family at bedside   Lars Masson, MD [place]

## 2017-09-29 LAB — BASIC METABOLIC PANEL
ANION GAP: 9 (ref 5–15)
BUN: 42 mg/dL — ABNORMAL HIGH (ref 6–20)
CHLORIDE: 117 mmol/L — AB (ref 101–111)
CO2: 20 mmol/L — AB (ref 22–32)
CREATININE: 1.36 mg/dL — AB (ref 0.61–1.24)
Calcium: 7.6 mg/dL — ABNORMAL LOW (ref 8.9–10.3)
GFR calc non Af Amer: 52 mL/min — ABNORMAL LOW (ref >60)
Glucose, Bld: 136 mg/dL — ABNORMAL HIGH (ref 65–99)
Potassium: 3.8 mmol/L (ref 3.5–5.1)
Sodium: 146 mmol/L — ABNORMAL HIGH (ref 135–145)

## 2017-09-29 LAB — GLUCOSE, CAPILLARY
GLUCOSE-CAPILLARY: 122 mg/dL — AB (ref 65–99)
GLUCOSE-CAPILLARY: 101 mg/dL — AB (ref 65–99)
Glucose-Capillary: 134 mg/dL — ABNORMAL HIGH (ref 65–99)
Glucose-Capillary: 115 mg/dL — ABNORMAL HIGH (ref 65–99)
Glucose-Capillary: 131 mg/dL — ABNORMAL HIGH (ref 65–99)
Glucose-Capillary: 133 mg/dL — ABNORMAL HIGH (ref 65–99)

## 2017-09-29 LAB — CBC WITH DIFFERENTIAL/PLATELET
Abs Immature Granulocytes: 0.1 10*3/uL (ref 0.0–0.1)
Basophils Absolute: 0 10*3/uL (ref 0.0–0.1)
Basophils Relative: 0 %
EOS PCT: 2 %
Eosinophils Absolute: 0.1 10*3/uL (ref 0.0–0.7)
HCT: 32.6 % — ABNORMAL LOW (ref 39.0–52.0)
Hemoglobin: 9.8 g/dL — ABNORMAL LOW (ref 13.0–17.0)
Immature Granulocytes: 1 %
LYMPHS ABS: 0.8 10*3/uL (ref 0.7–4.0)
Lymphocytes Relative: 11 %
MCH: 27.6 pg (ref 26.0–34.0)
MCHC: 30.1 g/dL (ref 30.0–36.0)
MCV: 91.8 fL (ref 78.0–100.0)
Monocytes Absolute: 0.5 10*3/uL (ref 0.1–1.0)
Monocytes Relative: 7 %
Neutro Abs: 6.2 10*3/uL (ref 1.7–7.7)
Neutrophils Relative %: 79 %
Platelets: 238 10*3/uL (ref 150–400)
RBC: 3.55 MIL/uL — AB (ref 4.22–5.81)
RDW: 15 % (ref 11.5–15.5)
WBC: 7.8 10*3/uL (ref 4.0–10.5)

## 2017-09-29 LAB — MAGNESIUM: Magnesium: 2.3 mg/dL (ref 1.7–2.4)

## 2017-09-29 MED ORDER — DEXMEDETOMIDINE HCL IN NACL 400 MCG/100ML IV SOLN
0.0000 ug/kg/h | INTRAVENOUS | Status: DC
  Administered 2017-09-29 – 2017-09-30 (×5): 0.8 ug/kg/h via INTRAVENOUS
  Filled 2017-09-29: qty 200
  Filled 2017-09-29 (×3): qty 100

## 2017-09-29 MED ORDER — LOPERAMIDE HCL 1 MG/7.5ML PO SUSP
4.0000 mg | Freq: Once | ORAL | Status: DC
  Filled 2017-09-29: qty 30

## 2017-09-29 MED ORDER — VITAL HIGH PROTEIN PO LIQD
1000.0000 mL | ORAL | Status: DC
  Administered 2017-09-29 – 2017-10-02 (×3): 1000 mL

## 2017-09-29 MED ORDER — TRAVASOL 10 % IV SOLN
INTRAVENOUS | Status: DC
  Filled 2017-09-29: qty 672

## 2017-09-29 MED ORDER — FAMOTIDINE IN NACL 20-0.9 MG/50ML-% IV SOLN
20.0000 mg | Freq: Two times a day (BID) | INTRAVENOUS | Status: DC
  Administered 2017-09-29 – 2017-10-07 (×16): 20 mg via INTRAVENOUS
  Filled 2017-09-29 (×17): qty 50

## 2017-09-29 NOTE — Progress Notes (Signed)
[facility]  Subjective  - opens eyes not following commands   Objective (!) 112/47 (!) 55 98.6 F (37 C) (Oral) (!) 23 100%  Intake/Output Summary (Last 24 hours) at 09/29/2017 1242 Last data filed at 09/29/2017 0800 Gross per 24 hour  Intake 2360 ml  Output 2915 ml  Net -555 ml   Abdomen open visible bowel pink, peritoneal fluid is turbid yellow purulent appearance  Assessment/Planning: S/p mesenteric bypass Slowly recovering Peritoneal fluid today is concerning with yellow turbid appearance will discuss with gen surg who is doing VAC change today  Ruta Hinds 09/29/2017 12:42 PM --  Laboratory Lab Results: Recent Labs    09/28/17 0359 09/29/17 0331  WBC 9.3 7.8  HGB 8.6* 9.8*  HCT 28.4* 32.6*  PLT 286 238   BMET Recent Labs    09/28/17 0359 09/29/17 0331  NA 146* 146*  K 3.8 3.8  CL 117* 117*  CO2 21* 20*  GLUCOSE 132* 136*  BUN 41* 42*  CREATININE 1.30* 1.36*  CALCIUM 7.6* 7.6*    COAG Lab Results  Component Value Date   INR 1.60 09/04/2017   INR 1.20 08/09/2014   INR 1.02 05/16/2013   No results found for: PTT

## 2017-09-29 NOTE — Progress Notes (Signed)
[facility]/Trauma Progress Note  6 Days Post-Op   Assessment/Plan Principal Problem: Mesenteric ischemia (HCC) Active Problems: Essential hypertension Arterial atherosclerosis HLD (hyperlipidemia) Abdominal pain PUD (peptic ulcer disease) Occlusion of superior mesenteric artery (HCC) Respiratory failure (HCC) Acute respiratory failure with hypoxemia (HCC)  Mesenteric ischemia 1.Exploratory laparotomy right hemicolectomy 09/04/2017 Dr. Stark Klein 2.Abdominal exploration, application of temporary abdominal closure device(ABTHERA), 09/04/2017 Dr. Nadeen Landau 3.Oratory laparotomy, ileostomy, application of open abdominal wound VAC, 09/06/2017 Dr. Georganna Skeans 4. Open abdomen for attempted closure, 09/08/2017 Dr. Judeth Horn 5. Oratory laparotomy with possible wound closure(prior sutures upper&lower portion of the fascia had broken), 09/10/2017 Dr. Judeth Horn 6. Reexploration open abdominal wound, application ofABTHERAWOUND VAC,09/14/2017 Dr. Greer Pickerel 7. Exploratory laparotomy and partial closure of abdomen using the elastic tension closure device (ABRA), Dr. Hulen Skains, 05/29 8. exploratory laparotomy and dressing change, Dr. Ninfa Linden, 05/31 9. Changed negative pressure dressing, Dr. Kieth Brightly, 06/03 10.reopening of recent laparotomy, incisional hernia repair, insertion of mesh, placement of negative pressure dressing, Dr. Kieth Brightly, 06/04 11. Bedside vac change, Dr. Kieth Brightly, 06/07  -no further fevers and WBC normal -watch ostomy output. If it goes back up or persists with negative fluid balances, then may add some anti-diarrheal.  YSA:YTKZ feeds VTE: SCD's SW:FUXN currently Foley:yes Follow up:TBD  DISPO:vac changetodayat bedside by surgery team. watch ostomy output and fluid balance. May need anti-diarrheal, but will watch for now.    LOS: 25 days    Subjective: CC: open abdomen  Pt is on ventilator. Will  open eyes and follow some commands. Trying to talk.  Objective: Vital signs in last 24 hours: Temp:  [98.1 F (36.7 C)-99.1 F (37.3 C)] 98.5 F (36.9 C) (06/10 0815) Pulse Rate:  [56-68] 58 (06/10 0845) Resp:  [20-36] 31 (06/10 0845) BP: (100-123)/(48-60) 121/52 (06/10 0845) SpO2:  [93 %-100 %] 100 % (06/10 0845) FiO2 (%):  [40 %] 40 % (06/10 0845) Weight:  [116.4 kg (256 lb 9.9 oz)] 116.4 kg (256 lb 9.9 oz) (06/10 0328) Last BM Date: 09/25/17  Intake/Output from previous day: 06/09 0701 - 06/10 0700 In: 2798.5 [I.V.:723.5; AT/FT:7322; IV Piggyback:150] Out: 0254 [YHCWC:3762; Drains:100; GBTDV:7616] Intake/Output this shift: Total I/O In: 85.5 [I.V.:30.5; NG/GT:55] Out: -   PE: Gen:On vent, will awake and trying to talk Card:mild bradycardia Pulm:On vent WVP:XTGGY vac. Tan liquid stool in ostomy bag Neuro: following some commands Skin: warm and dry  Anti-infectives: Anti-infectives (From admission, onward)   Start     Dose/Rate Route Frequency Ordered Stop   09/19/17 0730  ceFAZolin (ANCEF) IVPB 2g/100 mL premix     2 g 200 mL/hr over 30 Minutes Intravenous On call to O.R. 09/19/17 0715 09/19/17 1057   09/17/17 1145  ceFAZolin (ANCEF) 3 g in dextrose 5 % 50 mL IVPB     3 g 100 mL/hr over 30 Minutes Intravenous To ShortStay Surgical 09/17/17 0923 09/17/17 1449   09/07/17 1545  cefTRIAXone (ROCEPHIN) 2 g in sodium chloride 0.9 % 100 mL IVPB     2 g 200 mL/hr over 30 Minutes Intravenous Every 24 hours 09/07/17 1543 09/11/17 1605   09/04/17 1400  piperacillin-tazobactam (ZOSYN) IVPB 3.375 g  Status:  Discontinued     3.375 g 12.5 mL/hr over 240 Minutes Intravenous Every 8 hours 09/04/17 1157 09/07/17 1541   09/04/17 1100  ceFAZolin (ANCEF) IVPB 2g/100 mL premix  Status:  Discontinued     2 g 200 mL/hr over 30 Minutes Intravenous Every 8 hours 09/04/17 1035 09/04/17 1157   09/04/17  0630  piperacillin-tazobactam (ZOSYN) IVPB 3.375 g  Status:  Discontinued      3.375 g 100 mL/hr over 30 Minutes Intravenous  Once 09/04/17 0617 09/04/17 0626   09/04/17 0619  piperacillin-tazobactam (ZOSYN) IVPB 3.375 g     3.375 g 100 mL/hr over 30 Minutes Intravenous 60 min pre-op 09/04/17 0620 09/04/17 0717   09/04/17 0030  piperacillin-tazobactam (ZOSYN) IVPB 3.375 g     3.375 g 100 mL/hr over 30 Minutes Intravenous  Once 09/04/17 0024 09/04/17 0144      Lab Results:  Recent Labs    09/28/17 0359 09/29/17 0331  WBC 9.3 7.8  HGB 8.6* 9.8*  HCT 28.4* 32.6*  PLT 286 238   BMET Recent Labs    09/28/17 0359 09/29/17 0331  NA 146* 146*  K 3.8 3.8  CL 117* 117*  CO2 21* 20*  GLUCOSE 132* 136*  BUN 41* 42*  CREATININE 1.30* 1.36*  CALCIUM 7.6* 7.6*   PT/INR No results for input(s): LABPROT, INR in the last 72 hours. CMP     Component Value Date/Time   NA 146 (H) 09/29/2017 0331   K 3.8 09/29/2017 0331   CL 117 (H) 09/29/2017 0331   CO2 20 (L) 09/29/2017 0331   GLUCOSE 136 (H) 09/29/2017 0331   BUN 42 (H) 09/29/2017 0331   CREATININE 1.36 (H) 09/29/2017 0331   CALCIUM 7.6 (L) 09/29/2017 0331   PROT 5.3 (L) 09/28/2017 0359   ALBUMIN 1.3 (L) 09/28/2017 0359   AST 56 (H) 09/28/2017 0359   ALT 38 09/28/2017 0359   ALKPHOS 107 09/28/2017 0359   BILITOT 0.5 09/28/2017 0359   GFRNONAA 52 (L) 09/29/2017 0331   GFRAA >60 09/29/2017 0331   Lipase     Component Value Date/Time   LIPASE 37 09/03/2017 1644    Studies/Results: Dg Chest Port 1 View  Result Date: 09/27/2017 CLINICAL DATA:  Respiratory failure EXAM: PORTABLE CHEST 1 VIEW COMPARISON:  09/25/2017 chest radiograph. FINDINGS: Tracheostomy tube tip terminates over the tracheal air column at the level of the thoracic inlet. Left PICC terminates at the cavoatrial junction. Enteric tube terminates in the gastric fundus. Stable cardiomediastinal silhouette with normal heart size. No pneumothorax. No pleural effusion. Mild bibasilar atelectasis. No pulmonary edema. No acute consolidative  airspace disease. IMPRESSION: Support structures as detailed.  Mild bibasilar atelectasis. Electronically Signed   By: Ilona Sorrel M.D.   On: 09/27/2017 16:59      Kalman Drape , [facility] 09/29/2017, 8:58 AM  Pager: [phone] Mon-Wed, Friday 7:00am-4:30pm Thurs 7am-11:30am  Consults: [phone]

## 2017-09-29 NOTE — Progress Notes (Addendum)
Nutrition Follow-up  DOCUMENTATION CODES:   Obesity unspecified  INTERVENTION:  RD to monitor pt output Continue Vital High Protein@ 55 ml/hr increase to 30 ml/hr + Prostat TID via NG.  Total regimen provides: 1620 kcal, 160 grams protein, and 1103 ml free water; 100% of kcal and protein needs.   NUTRITION DIAGNOSIS:   Increased nutrient needs related to wound healing as evidenced by estimated needs.  Ongoing  GOAL:   Provide needs based on ASPEN/SCCM guidelines  Ongoing - met with TF  MONITOR:   TF tolerance, Skin  REASON FOR ASSESSMENT:   Consult Enteral/tube feeding initiation and management  ASSESSMENT:   Pt with PMH of HTN, active smoker (2 PPD), CAD, MI, stroke, PAD who was admitted 5/15 with severe epigastric and periumbilical pain found to have ischemic R colon. Pt s/p ex lap with R hemicolectomy 5/16.   5/16 mesenteric angiogram, aorto to SMA bypass, and R hemicolectomy 5/18 ileostomy 5/21 TF started 5/22 Return to OR for exploration, attempted closure  5/24 Trach on vent 5/24 cortrak place, 5/25 cortrak removed 5/29 Exploratory laparotomy, placement of ABBRA closure system  Pt wound still open, beside wound vac to be changed 6/10. RN reports pt with high ostomy output, but otherwise tolerating feeds; RN report trying new medication to see if that will help. Per MD will continue TF and D/c TPN order.   Pt awake on trach (vent). Pt unable to speak to dietetic intern.  Pt has some facial depletions (buccal, temporal, and orbital regions), unable to complete full NFPE due to edema. Depletions could indicate pt not meeting needs via TF potentially due to pt high output and open abdomen increasing his needs.   Pt at high risk for malnutrition; unable to document at this time.   Medications reviewed: PS TID, 250 ml FW Q6H, lasix, heparin, novolog 0-15 units, albumin, precedex, pepcid, vital HP @ 55 ml/hr.   Labs reviewed: Na 146 (H), Cl 117 (H), CO2 20 (H),  BG 136 (H), BUN 42 (H), creatinine 1.36 (H), GFR 52 (L), RBC 3.55 (L), hemoglobin 9.8 (L),. HCT 32.6 (L).   Net I/O since admit: + 13 L Wound vac output: 100 ml 6/9 Ileostomy output: 1600 ml 6/9  NUTRITION - FOCUSED PHYSICAL EXAM:    Most Recent Value  Orbital Region  Moderate depletion  Upper Arm Region  No depletion  Thoracic and Lumbar Region  No depletion  Buccal Region  Moderate depletion  Temple Region  Mild depletion  Clavicle Bone Region  No depletion  Clavicle and Acromion Bone Region  No depletion  Scapular Bone Region  Unable to assess  Dorsal Hand  Unable to assess  Patellar Region  Unable to assess  Anterior Thigh Region  Unable to assess  Posterior Calf Region  Unable to assess  Edema (RD Assessment)  Moderate  Hair  Reviewed  Eyes  Reviewed  Mouth  Reviewed  Skin  Reviewed  Nails  Reviewed       Diet Order:   Diet Order    None      EDUCATION NEEDS:   No education needs have been identified at this time  Skin:  Skin Assessment: Skin Integrity Issues: Skin Integrity Issues:: Wound VAC Wound Vac: abd  Last BM:  6/3 - ileostomy (300 ml)  Height:   Ht Readings from Last 1 Encounters:  09/06/17 _0  (1.702 m)    Weight:   Wt Readings from Last 1 Encounters:  09/29/17 256 lb 9.9 oz (116.4  kg)    Ideal Body Weight:  67.2 kg  BMI:  Body mass index is 40.19 kg/m.  Estimated Nutritional Needs:   Kcal:  1400-1700  Protein:  145-168 grams  Fluid:  > 2 L/day    Hope Budds, Dietetic Intern

## 2017-09-29 NOTE — Progress Notes (Addendum)
PHARMACY - ADULT TOTAL PARENTERAL NUTRITION CONSULT NOTE   Pharmacy Consult for TPN Indication: EC fistula  Patient Measurements: Height: 5\' 7"  (170.2 cm) Weight: 256 lb 9.9 oz (116.4 kg) IBW/kg (Calculated) : 66.1 TPN AdjBW (KG): 78.1 Body mass index is 40.19 kg/m.  Assessment:  67 yo M presents on 5/16 with abdominal pain. CT was concerning for mesenteric ischemia. Went to OR for ex lap and R hemicolectomy on 5/16. Returned to OR on 5/18, 5/19, 5/22, 5/24, 5/26, 5/29, 5/31, 6/4, and 6/7 for ex laps, ileostomy and multiple attempts at abdomen closure. Has had prolonged NPO statuses since admit and off and on TFs.   GI: Had been tolerating TFs at 45ml/hr over last week but remains with high output fistula. 1,600 ml of ostomy output yesterday. Drain only with 144ml of output. Albumin low at 1.3. Endo: CBGs controlled (110-130s) on SSI Insulin requirements in the past 24 hours: 6 units   Lytes: wnl today.  Renal: SCr 1.36. UOP 0.64ml/kg/hr Pulm: Intubated on 40% FiO2 Cards: HR brady, BP ok Hepatobil: LFTs ok, AST down to 56. Tbili wnl. No Trig Neuro: GCS 12, RASS -2 (goal -2), CPOT 1 ID: No abx. Afebrile, WBC wnl  TPN Access: PICC double lumen TPN start date: 6/10 >> Nutritional Goals (per RD recommendation on 6/3): KCal: 1,400-1,700 Protein: 145-168 g Fluid: > 2 L per day  Goal TPN rate is 90 ml/hr (provides 100% of needs)  Current Nutrition:   Plan:  Start TPN at 2mL/hr. Hold 20% lipid emulsion for first 7 days for ICU patients per ASPEN guidelines (Start by at least 6/17) This TPN provides 67 g of protein, 125 g of dextrose, and 0 g of lipids which provides 693 kCals per day, meeting 44% of patient needs. Will titrate up to goal as tolerated Electrolytes in TPN: standard concentration; max acetate Add MVI, trace elements, and famotidine 40mg  to TPN Continue moderate SSI Q4h and adjust as needed Continue NS at 12ml/hr per MD Monitor TPN labs F/U improvement in EC  fistula  ADDENDUM:  Now there is no evidence of fistula. TPN will not be started at this time  Laurelin Elson J 09/29/2017,11:34 AM

## 2017-09-29 NOTE — Progress Notes (Addendum)
PULMONARY / CRITICAL CARE MEDICINE   Name: Luke Kaufman MRN: [id number] DOB: [birth year]    ADMISSION DATE:  09/03/2017 CONSULTATION DATE:  5/16  REFERRING MD:  Oneida Alar  Interval Hx: [name] remains clinically ventilated via tracheostomy.  He is on no pressors.  He is sedated on Precedex and not at all interactive.  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 Fascia remains open, agitation barrier to weaning. Trached 5/24., to return to OR 5/26 to place a wound closure device   STUDIES:  CT ab/pelvis 5/15 > portal venous gas in liver, sigmoid colon with diverticular disease, atrophic left kidney  CULTURES: 5/17 resp > e coli resistant to multiple antibiotics 5/17 blood >  negative  ANTIBIOTICS: 5/15 zosyn > 5/19  5/19 ceftriaxone > 5/23   LINES/TUBES: 5/16 R Ailey CVL > 5/24 5/16 ETT > 5/24 5/24 Trach 5/18 R femoral arterial line > 5/24 5/24 LUA PICC   SIGNIFICANT EVENTS: 5/16 mesenteric angiogram and aorto to SMA bypass 5/16 R hemicolectomy 5/18 ileostomy 5/19 abdominal washout and partial fascial closure 5/21 TF started, hypoxia with turning  5/22 Return to OR for exploration, attempted closure > unable 5/24 Trach 5/26 OR for debridement and dressing change 5/29 Exploratory laparotomy, placement of ABBRA closure system 5/31 - No events overnight. Plans to return to OR for dressing change   1. Exploratory laparotomy right hemicolectomy 09/04/2017 Dr. Stark Klein 2.Abdominal exploration, application of temporary abdominal closure device(ABTHERA), 09/04/2017 Dr. Nadeen Landau 3.Oratory laparotomy, ileostomy, application of open abdominal wound VAC, 09/06/2017 Dr. Georganna Skeans 4. Open abdomen for attempted closure, 09/08/2017 Dr. Judeth Horn 5. Oratory  laparotomy with possible wound closure(prior sutures upper&lower portion of the fascia had broken), 09/10/2017 Dr. Judeth Horn 6. Reexploration open abdominal wound, application ofABTHERAWOUND VAC,09/14/2017 Dr. Greer Pickerel 7. Exploratory laparotomy and partial closure of abdomen using the elastic tension closure device (ABRA), Dr. Hulen Skains, 05/29 8. exploratory laparotomy and dressing change, Dr. Ninfa Linden, 05/31 9. Changed negative pressure dressing, Dr. Kieth Brightly, 06/03 10. reopening of recent laparotomy, incisional hernia repair, insertion of mesh, placement of negative pressure dressing, Dr. Kieth Brightly, 06/04  11. Bedside vac change, Dr. Kieth Brightly, 06/07  SUBJECTIVE/OVERNIGHT/INTERVAL HX Persistent cuff leak, planning for bedside vac change today by surgery. Ostomy remains high output.   VITAL SIGNS: BP (!) 121/52   Pulse (!) 58   Temp 98.5 F (36.9 C) (Axillary) Comment (Src): Patient was attempting to speak, temp was taken axillary.  Resp (!) 31   Ht 5\' 7"  (1.702 m)   Wt 116.4 kg (256 lb 9.9 oz)   SpO2 100%   BMI 40.19 kg/m   HEMODYNAMICS: CVP:  [8 mmHg-13 mmHg] 10 mmHg  VENTILATOR SETTINGS: Vent Mode: PRVC FiO2 (%):  [40 %] 40 % Set Rate:  [20 bmp] 20 bmp Vt Set:  [530 mL] 530 mL PEEP:  [5 cmH20] 5 cmH20 Plateau Pressure:  [10 cmH20-16 cmH20] 16 cmH20  INTAKE / OUTPUT: I/O last 3 completed shifts: In: 3666.7 [I.V.:936.7; NG/GT:2530; IV Piggyback:200] Out: 8099 [Urine:2375; Drains:350; Stool:2600]  Physical exam  General:  Obese chronically ill appearing male in NAD on vent Neuro:  Awake,alert, able to communicate talking around trach.  HEENT:  [place]/AT, No JVD noted, PERRL Cardiovascular:  Borderline brady. No MRG Lungs: Coarse bibasilar. Tachypnea. Cuff leak. With #6 tracheostomy.  Abdomen:  Soft, non-distended, tender. Wound vac in place, open abdomen.  Musculoskeletal:  No acute deformity or ROM limitation Skin:  Intact, MMM    PULMONARY Recent Labs  Lab  09/28/17 0359  PHART 7.465*  PCO2ART 27.6*  PO2ART 102  HCO3 19.5*  O2SAT 97.6    CBC Recent Labs  Lab 09/25/17 0500 09/28/17 0359 09/29/17 0331  HGB 8.8* 8.6* 9.8*  HCT 28.5* 28.4* 32.6*  WBC 8.7 9.3 7.8  PLT 275 286 238    COAGULATION No results for input(s): INR in the last 168 hours.  CARDIAC  No results for input(s): TROPONINI in the last 168 hours. No results for input(s): PROBNP in the last 168 hours.   CHEMISTRY Recent Labs  Lab 09/23/17 0336 09/24/17 0234 09/25/17 0500 09/26/17 0543 09/27/17 0339 09/28/17 0359 09/29/17 0331  NA 139 140 142 142 145 146* 146*  K 4.4 4.3 4.0 3.8 4.4 3.8 3.8  CL 112* 116* 115* 116* 118* 117* 117*  CO2 21* 18* 20* 20* 19* 21* 20*  GLUCOSE 113* 110* 124* 110* 123* 132* 136*  BUN 26* 23* 26* 30* 44* 41* 42*  CREATININE 1.32* 1.37* 1.42* 1.41* 1.69* 1.30* 1.36*  CALCIUM 7.3* 6.9* 7.7* 7.5* 7.7* 7.6* 7.6*  MG 1.9 1.8  --   --   --   --  2.3  PHOS 3.2 2.9  --   --   --   --   --    Estimated Creatinine Clearance: 64.3 mL/min (A) (by C-G formula based on SCr of 1.36 mg/dL (H)).   LIVER Recent Labs  Lab 09/26/17 0543 09/27/17 0339 09/28/17 0359  AST 83* 64* 56*  ALT 39 41 38  ALKPHOS 127* 117 107  BILITOT 0.4 0.6 0.5  PROT 5.0* 5.2* 5.3*  ALBUMIN 1.2* 1.3* 1.3*     INFECTIOUS Recent Labs  Lab 09/26/17 1133 09/27/17 0339 09/28/17 0359  LATICACIDVEN 1.4  --   --   PROCALCITON 0.59 0.47 0.31     ENDOCRINE CBG (last 3)  Recent Labs    09/28/17 2319 09/29/17 0314 09/29/17 0820  GLUCAP 122* 122* 134*     IMAGING x48h  - image(s) personally visualized  -   highlighted in bold Dg Chest Port 1 View  Result Date: 09/27/2017 CLINICAL DATA:  Respiratory failure EXAM: PORTABLE CHEST 1 VIEW COMPARISON:  09/25/2017 chest radiograph. FINDINGS: Tracheostomy tube tip terminates over the tracheal air column at the level of the thoracic inlet. Left PICC terminates at the cavoatrial junction. Enteric tube terminates  in the gastric fundus. Stable cardiomediastinal silhouette with normal heart size. No pneumothorax. No pleural effusion. Mild bibasilar atelectasis. No pulmonary edema. No acute consolidative airspace disease. IMPRESSION: Support structures as detailed.  Mild bibasilar atelectasis. Electronically Signed   By: Ilona Sorrel M.D.   On: 09/27/2017 16:59     DISCUSSION: 67 y/o male with an SMA occlusion requiring emergent bypass and eventually R hemicolectomy and ilestomy is gone back to the operating room several times for washouts as well as working on abdominal closure.  Most recently had mesh placed.  He is been on IV sedation for a prolonged period of time and is at a very high risk for prolonged encephalopathy.  ASSESSMENT / PLAN:   Active issues:  Ischemic bowel, s/p resection with hemicolectomy and ileostomy; now status post several washouts.  Severe protein calorie malnutrition Plan -Continue tube feeds -Additional wound care per surgery, plns for vac change today.  Acute respiratory failure with hypoxemia with acute  pulmonary edema  Status post tracheostomy 5/24 H/O COPD -Continuing full vent support -Unclear how aggressively we should be weaning with open abdomen, hopefully surgical team can clarify.  -Diuresing with albumin and Lasix 6/9 with only marginal change in UOP. Creat remained stable.  -Scheduled nebs  Hypotension. Resolved   History of coronary artery disease, prior MI, hypertension and peripheral vascular disease Plan -Continue telemetry monitoring -Keep euvolemic to negative as long as remains normotensive -Statin  Acute kidney injury, anasarca Hypernatremia/hyperchloremia. May be intravascularly dry.  Hypocalcemia Corrects to 9.8 - Improved - Continue albumin, Lasix - Check albumin, ionized calcium on AM labs.  - Consider free water increase   Acute encephalopathy w/ hypoactive delirium Plan -Precedex infusion for RASS goal -1 to -2 -PRN fentanyl for  pain  Anemia of critical illness - No evidence of active bleeding currently Plan Trend CBC Transfuse per protocol for hemoglobin less than 7  Mild leukocytosis, and temperature Maximum temperature 101 a few days back, white blood cell count stable. Suspect SIRS reaction; however is at risk for abdominal wound infection. PCT and LA reassuring 6/9. Plan Trend CBC and fever curve For wound vac change today.  Hyperglycemia Glycemic control is excellent Plan Continue sliding scale insulin  DVT prophylaxis: SCD SUP: H2B Diet: Tubefeeds Activity: BR Disposition : ICU  FAMILY  - Updates: Sister Bethena Roys updated 6/10 via telephone.   Georgann Housekeeper, AGACNP-BC [facility] Pulmonology/Critical Care Pager [phone] or [phone]  09/29/2017 9:11 AM

## 2017-09-29 NOTE — Procedures (Signed)
Operative note  Indications for procedure: Tracheostomy with cuff leak, VAC dressing on abdominal wound needs to be changed  Preoperative diagnosis 1. status post tracheostomy now with cuff leak  2.  VAC in place on abdominal wound  Postoperative diagnosis: 1.  No cuff leak with new tracheostomy 2.  No evidence of fistula or enteric content leak  Procedure: 1.  Tracheostomy change tube #8 Long proximal 2.  Bedside VAC change 355cm squared  Surgeon: Georganna Skeans, MD  Assistant: Saverio Danker, PA-C, Jackson Latino, PA-C  Procedure in detail: We did a timeout procedure.  Attention was first directed to the tracheostomy.  His old tracheostomy tube was removed and I placed a new long proximal #8 Shiley tracheostomy tube without difficulty.  Good color change on end-tidal detector.  This was hooked up to the ventilator and excellent volume returns were obtained after inflating the cuff.  No cuff leak.  He tolerated this well and saturations remained good throughout.  Attention was then directed to his abdominal VAC change.  The old VAC sponge was removed.  There was a bit of fibrinous exudate around the edges of the fascia but no evidence of enteric content leak or fistula.  This was cleaned up with some sterile gauze and then we fashioned a new VAC sponge using a large VAC sponge and then a medium VAC sponge which was cut into 2 pieces in order to totally cover the area.  355 cm.  Back straight was applied and we hooked up the back suction pump.  Excellent seal was obtained.  He tolerated the procedure well.  He remained monitored in the intensive care unit throughout.  Georganna Skeans, MD, MPH, FACS Trauma: [phone] General Surgery: [phone]

## 2017-09-30 ENCOUNTER — Inpatient Hospital Stay (HOSPITAL_COMMUNITY): Payer: PPO

## 2017-09-30 LAB — BLOOD GAS, ARTERIAL
ACID-BASE DEFICIT: 3.4 mmol/L — AB (ref 0.0–2.0)
Bicarbonate: 20.2 mmol/L (ref 20.0–28.0)
Drawn by: 236041
FIO2: 100
MECHVT: 530 mL
O2 SAT: 99.6 %
PCO2 ART: 32.2 mmHg (ref 32.0–48.0)
PEEP: 5 cmH2O
PH ART: 7.419 (ref 7.350–7.450)
Patient temperature: 99.8
RATE: 24 resp/min
pO2, Arterial: 355 mmHg — ABNORMAL HIGH (ref 83.0–108.0)

## 2017-09-30 LAB — DIFFERENTIAL
Abs Immature Granulocytes: 0 10*3/uL (ref 0.0–0.1)
BASOS PCT: 0 %
Basophils Absolute: 0 10*3/uL (ref 0.0–0.1)
EOS PCT: 2 %
Eosinophils Absolute: 0.2 10*3/uL (ref 0.0–0.7)
Immature Granulocytes: 1 %
Lymphocytes Relative: 14 %
Lymphs Abs: 1.1 10*3/uL (ref 0.7–4.0)
MONOS PCT: 6 %
Monocytes Absolute: 0.5 10*3/uL (ref 0.1–1.0)
NEUTROS ABS: 5.9 10*3/uL (ref 1.7–7.7)
NEUTROS PCT: 77 %

## 2017-09-30 LAB — COMPREHENSIVE METABOLIC PANEL
ALK PHOS: 81 U/L (ref 38–126)
ALT: 24 U/L (ref 17–63)
ALT: 30 U/L (ref 17–63)
AST: 28 U/L (ref 15–41)
AST: 54 U/L — AB (ref 15–41)
Albumin: 2.4 g/dL — ABNORMAL LOW (ref 3.5–5.0)
Albumin: 2.8 g/dL — ABNORMAL LOW (ref 3.5–5.0)
Alkaline Phosphatase: 117 U/L (ref 38–126)
Anion gap: 7 (ref 5–15)
Anion gap: 16 — ABNORMAL HIGH (ref 5–15)
BUN: 40 mg/dL — AB (ref 6–20)
BUN: 37 mg/dL — AB (ref 6–20)
CO2: 22 mmol/L (ref 22–32)
CO2: 17 mmol/L — ABNORMAL LOW (ref 22–32)
CREATININE: 1.36 mg/dL — AB (ref 0.61–1.24)
Calcium: 7.9 mg/dL — ABNORMAL LOW (ref 8.9–10.3)
Calcium: 7.9 mg/dL — ABNORMAL LOW (ref 8.9–10.3)
Chloride: 120 mmol/L — ABNORMAL HIGH (ref 101–111)
Chloride: 116 mmol/L — ABNORMAL HIGH (ref 101–111)
Creatinine, Ser: 1.53 mg/dL — ABNORMAL HIGH (ref 0.61–1.24)
GFR calc Af Amer: 53 mL/min — ABNORMAL LOW (ref >60)
GFR, EST NON AFRICAN AMERICAN: 52 mL/min — AB (ref >60)
GFR, EST NON AFRICAN AMERICAN: 45 mL/min — AB (ref >60)
Glucose, Bld: 122 mg/dL — ABNORMAL HIGH (ref 65–99)
Glucose, Bld: 159 mg/dL — ABNORMAL HIGH (ref 65–99)
POTASSIUM: 3.9 mmol/L (ref 3.5–5.1)
Potassium: 3.5 mmol/L (ref 3.5–5.1)
Sodium: 149 mmol/L — ABNORMAL HIGH (ref 135–145)
Sodium: 149 mmol/L — ABNORMAL HIGH (ref 135–145)
TOTAL PROTEIN: 5.9 g/dL — AB (ref 6.5–8.1)
Total Bilirubin: 0.5 mg/dL (ref 0.3–1.2)
Total Bilirubin: 0.7 mg/dL (ref 0.3–1.2)
Total Protein: 5.7 g/dL — ABNORMAL LOW (ref 6.5–8.1)

## 2017-09-30 LAB — POCT I-STAT 3, ART BLOOD GAS (G3+)
Acid-base deficit: 11 mmol/L — ABNORMAL HIGH (ref 0.0–2.0)
Bicarbonate: 16.3 mmol/L — ABNORMAL LOW (ref 20.0–28.0)
O2 SAT: 92 %
Patient temperature: 99.8
TCO2: 18 mmol/L — AB (ref 22–32)
pCO2 arterial: 44.1 mmHg (ref 32.0–48.0)
pH, Arterial: 7.181 — CL (ref 7.350–7.450)
pO2, Arterial: 82 mmHg — ABNORMAL LOW (ref 83.0–108.0)

## 2017-09-30 LAB — CBC
HCT: 26.4 % — ABNORMAL LOW (ref 39.0–52.0)
HEMATOCRIT: 30.8 % — AB (ref 39.0–52.0)
HEMOGLOBIN: 7.9 g/dL — AB (ref 13.0–17.0)
Hemoglobin: 9.1 g/dL — ABNORMAL LOW (ref 13.0–17.0)
MCH: 27.6 pg (ref 26.0–34.0)
MCH: 28.1 pg (ref 26.0–34.0)
MCHC: 29.9 g/dL — AB (ref 30.0–36.0)
MCHC: 29.5 g/dL — AB (ref 30.0–36.0)
MCV: 92.3 fL (ref 78.0–100.0)
MCV: 95.1 fL (ref 78.0–100.0)
PLATELETS: 273 10*3/uL (ref 150–400)
Platelets: 323 10*3/uL (ref 150–400)
RBC: 2.86 MIL/uL — ABNORMAL LOW (ref 4.22–5.81)
RBC: 3.24 MIL/uL — ABNORMAL LOW (ref 4.22–5.81)
RDW: 15.1 % (ref 11.5–15.5)
RDW: 15.3 % (ref 11.5–15.5)
WBC: 7.7 10*3/uL (ref 4.0–10.5)
WBC: 7.8 10*3/uL (ref 4.0–10.5)

## 2017-09-30 LAB — GLUCOSE, CAPILLARY
GLUCOSE-CAPILLARY: 76 mg/dL (ref 65–99)
Glucose-Capillary: 122 mg/dL — ABNORMAL HIGH (ref 65–99)
Glucose-Capillary: 130 mg/dL — ABNORMAL HIGH (ref 65–99)
Glucose-Capillary: 122 mg/dL — ABNORMAL HIGH (ref 65–99)
Glucose-Capillary: 112 mg/dL — ABNORMAL HIGH (ref 65–99)

## 2017-09-30 LAB — PROCALCITONIN: Procalcitonin: 0.23 ng/mL

## 2017-09-30 LAB — LACTIC ACID, PLASMA: Lactic Acid, Venous: 6 mmol/L (ref 0.5–1.9)

## 2017-09-30 LAB — PHOSPHORUS: PHOSPHORUS: 3.2 mg/dL (ref 2.5–4.6)

## 2017-09-30 LAB — MAGNESIUM: MAGNESIUM: 2.5 mg/dL — AB (ref 1.7–2.4)

## 2017-09-30 LAB — TROPONIN I: TROPONIN I: 0.06 ng/mL — AB (ref <0.03)

## 2017-09-30 MED ORDER — LOPERAMIDE HCL 1 MG/7.5ML PO SUSP
4.0000 mg | Freq: Two times a day (BID) | ORAL | Status: DC
  Administered 2017-09-30 – 2017-10-03 (×6): 4 mg via ORAL
  Filled 2017-09-30 (×9): qty 30

## 2017-09-30 MED ORDER — MIDAZOLAM HCL 2 MG/2ML IJ SOLN
2.0000 mg | Freq: Once | INTRAMUSCULAR | Status: AC
  Administered 2017-09-30: 2 mg via INTRAVENOUS
  Filled 2017-09-30: qty 2

## 2017-09-30 MED ORDER — FUROSEMIDE 10 MG/ML IJ SOLN
20.0000 mg | Freq: Two times a day (BID) | INTRAMUSCULAR | Status: DC
  Administered 2017-09-30: 20 mg via INTRAVENOUS
  Filled 2017-09-30: qty 2

## 2017-09-30 MED ORDER — IOPAMIDOL (ISOVUE-370) INJECTION 76%
INTRAVENOUS | Status: AC
  Filled 2017-09-30: qty 100

## 2017-09-30 MED ORDER — MIDAZOLAM HCL 2 MG/2ML IJ SOLN
1.0000 mg | INTRAMUSCULAR | Status: DC | PRN
  Filled 2017-09-30 (×2): qty 2

## 2017-09-30 MED ORDER — NOREPINEPHRINE 4 MG/250ML-% IV SOLN
0.0000 ug/min | INTRAVENOUS | Status: DC
  Filled 2017-09-30: qty 250

## 2017-09-30 MED ORDER — FENTANYL BOLUS VIA INFUSION
25.0000 ug | INTRAVENOUS | Status: DC | PRN
  Filled 2017-09-30: qty 25

## 2017-09-30 MED ORDER — QUETIAPINE FUMARATE 50 MG PO TABS
50.0000 mg | ORAL_TABLET | Freq: Every day | ORAL | Status: DC
  Administered 2017-10-01 – 2017-10-02 (×2): 50 mg via ORAL
  Filled 2017-09-30 (×3): qty 1

## 2017-09-30 MED ORDER — FREE WATER
300.0000 mL | Freq: Four times a day (QID) | Status: DC
  Administered 2017-09-30 – 2017-10-05 (×18): 300 mL

## 2017-09-30 MED ORDER — FENTANYL CITRATE (PF) 100 MCG/2ML IJ SOLN
50.0000 ug | Freq: Once | INTRAMUSCULAR | Status: AC
  Administered 2017-09-30: 50 ug via INTRAVENOUS
  Filled 2017-09-30: qty 2

## 2017-09-30 MED ORDER — SODIUM BICARBONATE 8.4 % IV SOLN
50.0000 meq | Freq: Once | INTRAVENOUS | Status: AC
  Administered 2017-09-30: 50 meq via INTRAVENOUS

## 2017-09-30 MED ORDER — MIDAZOLAM HCL 2 MG/2ML IJ SOLN
1.0000 mg | INTRAMUSCULAR | Status: DC | PRN
  Administered 2017-09-30: 2 mg via INTRAVENOUS

## 2017-09-30 MED ORDER — FENTANYL 2500MCG IN NS 250ML (10MCG/ML) PREMIX INFUSION
0.0000 ug/h | INTRAVENOUS | Status: DC
  Administered 2017-09-30: 50 ug/h via INTRAVENOUS
  Administered 2017-10-01: 150 ug/h via INTRAVENOUS
  Filled 2017-09-30 (×2): qty 250

## 2017-09-30 NOTE — Progress Notes (Signed)
CRITICAL VALUE ALERT  Critical Value:  Troponin, 0.06  Date & Time Notied:  09/30/17 @2220   Provider Notified: Jennelle Human, NP  Orders Received/Actions taken: No new orders, expected per NP from CPR

## 2017-09-30 NOTE — Significant Event (Signed)
Found patient's tooth inside the ileostomy bag when RN was emptying bag. No bleeding in mouth all day today during or after mouth care.    Luke Kaufman

## 2017-09-30 NOTE — Progress Notes (Deleted)
RT note-Patient placed on Sauter nasal canula. Patient was wearing simple canula in mouth. Continue to monitor results of new canula.

## 2017-09-30 NOTE — Progress Notes (Signed)
CRITICAL VALUE ALERT  Critical Value:  Lactic acid 6.0   Date & Time Notied:  09/30/17 11:32 PM   Provider Notified: Jennelle Human, NP  Orders Received/Actions taken: No new orders

## 2017-09-30 NOTE — Progress Notes (Addendum)
PULMONARY / CRITICAL CARE MEDICINE   Name: Luke Kaufman MRN: [id number] DOB: July 24, 1950    ADMISSION DATE:  09/03/2017 CONSULTATION DATE:  5/16  REFERRING MD:  Oneida Alar  Interval Hx: Vac and trach changed yesterday. Cuff leak resolved. CCS feels ok to keep TF going, no evidence fistula.   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 Fascia remains open, agitation barrier to weaning. Trached 5/24., to return to OR 5/26 to place a wound closure device   STUDIES:  CT ab/pelvis 5/15 > portal venous gas in liver, sigmoid colon with diverticular disease, atrophic left kidney  CULTURES: 5/17 resp > e coli resistant to multiple antibiotics 5/17 blood >  negative  ANTIBIOTICS: 5/15 zosyn > 5/19  5/19 ceftriaxone > 5/23   LINES/TUBES: 5/16 R [place]asper CVL > 5/24 5/16 ETT > 5/24 5/24 Trach > 6/10 prox XLT placed >>> 5/18 R femoral arterial line > 5/24 5/24 LUA PICC   SIGNIFICANT EVENTS: 5/16 mesenteric angiogram and aorto to SMA bypass 5/16 R hemicolectomy 5/18 ileostomy 5/19 abdominal washout and partial fascial closure 5/21 TF started, hypoxia with turning  5/22 Return to OR for exploration, attempted closure > unable 5/24 Trach 5/26 OR for debridement and dressing change 5/29 Exploratory laparotomy, placement of ABBRA closure system 5/31 - No events overnight. Plans to return to OR for dressing change   1. Exploratory laparotomy right hemicolectomy 09/04/2017 Dr. Stark Klein 2.Abdominal exploration, application of temporary abdominal closure device(ABTHERA), 09/04/2017 Dr. Nadeen Landau 3.Oratory laparotomy, ileostomy, application of open abdominal wound VAC, 09/06/2017 Dr. Georganna Skeans 4. Open abdomen for attempted closure, 09/08/2017 Dr. Judeth Horn 5. Oratory laparotomy  with possible wound closure(prior sutures upper&lower portion of the fascia had broken), 09/10/2017 Dr. Judeth Horn 6. Reexploration open abdominal wound, application ofABTHERAWOUND VAC,09/14/2017 Dr. Greer Pickerel 7. Exploratory laparotomy and partial closure of abdomen using the elastic tension closure device (ABRA), Dr. Hulen Skains, 05/29 8. exploratory laparotomy and dressing change, Dr. Ninfa Linden, 05/31 9. Changed negative pressure dressing, Dr. Kieth Brightly, 06/03 10. reopening of recent laparotomy, incisional hernia repair, insertion of mesh, placement of negative pressure dressing, Dr. Kieth Brightly, 06/04  11. Bedside vac change, Dr. Kieth Brightly, 06/07 12. 6/10 bedside vac change, trach change  SUBJECTIVE/OVERNIGHT/INTERVAL HX Persistent cuff leak, planning for bedside vac change today by surgery. Ostomy remains high output.   VITAL SIGNS: BP (!) 116/49   Pulse (!) 56   Temp 99.2 F (37.3 C) (Oral)   Resp 20   Ht 5\' 7"  (1.702 m)   Wt 110.9 kg (244 lb 7.8 oz)   SpO2 100%   BMI 38.29 kg/m   HEMODYNAMICS: CVP:  [5 mmHg] 5 mmHg  VENTILATOR SETTINGS: Vent Mode: PSV FiO2 (%):  [40 %] 40 % Set Rate:  [20 bmp] 20 bmp Vt Set:  [530 mL] 530 mL PEEP:  [5 cmH20] 5 cmH20 Pressure Support:  [12 cmH20-15 cmH20] 12 cmH20 Plateau Pressure:  [14 cmH20-15 cmH20] 14 cmH20  INTAKE / OUTPUT: I/O last 3 completed shifts: In: 4209.1 [I.V.:1068.3; NG/GT:2790.8; IV Piggyback:350] Out: 73 [Urine:2280; Drains:270; Stool:2200]  Physical exam  General:  Obese chronically ill appearing male in NAD on vent Neuro: Sedated. RASS -2. Was awake and following commands earlier this am per RN, but weak on L.  HEENT:  [place]/AT, No JVD noted, PERRL Cardiovascular:  Bradycardic, regular.  No MRG Lungs: Coarse bibasilar. Weaning. Cuff leak resolved. With #8 tracheostomy proximal XLT.  Abdomen:  Soft, non-distended, tender. Wound vac in place, open abdomen.  Musculoskeletal:  No acute deformity or ROM limitation Skin:   Intact, MMM    PULMONARY Recent Labs  Lab 09/28/17 0359  PHART 7.465*  PCO2ART 27.6*  PO2ART 102  HCO3 19.5*  O2SAT 97.6    CBC Recent Labs  Lab 09/25/17 0500 09/28/17 0359 09/29/17 0331  HGB 8.8* 8.6* 9.8*  HCT 28.5* 28.4* 32.6*  WBC 8.7 9.3 7.8  PLT 275 286 238    COAGULATION No results for input(s): INR in the last 168 hours.  CARDIAC  No results for input(s): TROPONINI in the last 168 hours. No results for input(s): PROBNP in the last 168 hours.   CHEMISTRY Recent Labs  Lab 09/24/17 0234 09/25/17 0500 09/26/17 0543 09/27/17 0339 09/28/17 0359 09/29/17 0331  NA 140 142 142 145 146* 146*  K 4.3 4.0 3.8 4.4 3.8 3.8  CL 116* 115* 116* 118* 117* 117*  CO2 18* 20* 20* 19* 21* 20*  GLUCOSE 110* 124* 110* 123* 132* 136*  BUN 23* 26* 30* 44* 41* 42*  CREATININE 1.37* 1.42* 1.41* 1.69* 1.30* 1.36*  CALCIUM 6.9* 7.7* 7.5* 7.7* 7.6* 7.6*  MG 1.8  --   --   --   --  2.3  PHOS 2.9  --   --   --   --   --    Estimated Creatinine Clearance: 62.6 mL/min (A) (by C-G formula based on SCr of 1.36 mg/dL (H)).   LIVER Recent Labs  Lab 09/26/17 0543 09/27/17 0339 09/28/17 0359  AST 83* 64* 56*  ALT 39 41 38  ALKPHOS 127* 117 107  BILITOT 0.4 0.6 0.5  PROT 5.0* 5.2* 5.3*  ALBUMIN 1.2* 1.3* 1.3*     INFECTIOUS Recent Labs  Lab 09/26/17 1133 09/27/17 0339 09/28/17 0359  LATICACIDVEN 1.4  --   --   PROCALCITON 0.59 0.47 0.31     ENDOCRINE CBG (last 3)  Recent Labs    09/29/17 2327 09/30/17 0409 09/30/17 0739  GLUCAP 133* 122* 130*     IMAGING x48h  - image(s) personally visualized  -   highlighted in bold No results found.   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  Most recently had mesh placed.  He is been on IV sedation for a prolonged period of time and is at a  very high risk for prolonged encephalopathy.   ASSESSMENT / PLAN:   Active issues:  Ischemic bowel, s/p resection with hemicolectomy and ileostomy; now status post several washouts.  Severe protein calorie malnutrition Plan - TF/Wound care per surgery  Acute respiratory failure with hypoxemia with acute pulmonary edema  Status post tracheostomy 5/24 H/O COPD - Continuing full vent support - Weaning trials PS as tolerated - Mobilize/OOB to chair today.  - Diuresing with albumin and Lasix  Hypotension. Resolved   History of coronary artery disease, prior MI, hypertension and peripheral vascular disease Plan - Continue telemetry monitoring - Keep euvolemic to negative as long as remains normotensive. Continue low dose diuresis with albumin today. 2 more doses then re-evaluate.  - Statin  Acute kidney injury, anasarca Hypernatremia/hyperchloremia.  Hypocalcemia: pseudo. Corrects wnl - Continue albumin, Lasix as above - BMP this AM pending -  Increase free water  Acute encephalopathy w/ hypoactive delirium Plan - Wean precedex to off. Fentanyl PRN for analgesia.  - RASS goal 0 assuming pain is controlled.   Anemia of critical illness - No evidence of active bleeding currently Plan Trend CBC Transfuse per protocol for hemoglobin less than 7  Mild leukocytosis, and temperature Plan Trend CBC and fever curve For wound vac change today.  Hyperglycemia Plan Continue sliding scale insulin  DVT prophylaxis: SCD SUP: H2B IV for now Diet: Tubefeeds Activity: To chair  Disposition : ICU  FAMILY  - Updates: Sister Bethena Roys updated 6/10 via telephone.   Georgann Housekeeper, AGACNP-BC [facility] Pulmonology/Critical Care Pager [phone] or [phone]  09/30/2017 9:43 AM   Critical care attending attestation note:  Patient seen and examined and relevant ancillary tests reviewed.  I agree with the assessment and plan of care as outlined by the Heber [place], NP.  Synopsis of  assessment and plan:  [name] Acute hypoxic respiratory failure following laparotomy and partial bowel resection for ischemic gut.  Abdomen is still open with wound VAC in place.  However the patient has made considerable progress in weaning: We have stopped all sedative infusions.  He is awake and able to follow simple commands.  We will start some nighttime Seroquel to prevent sundowning.  He has tolerated pressure support for the better part of the day and is ready to start trach collar trials tomorrow.  We will begin mobilization.  He has a functioning ileostomy with copious stool (to be expected given position of ostomy).  Minimal wound VAC drainage.  CRITICAL CARE  Performed by: Kipp Brood  Total critical care time: 30 minutes  Critical care time was exclusive of separately billable procedures and treating other patients.  Critical care was necessary to treat or prevent imminent or life-threatening deterioration.  Critical care was time spent personally by me on the following activities: development of treatment plan with patient and/or surrogate as well as nursing, discussions with consultants, evaluation of patient's response to treatment, examination of patient, obtaining history from patient or surrogate, ordering and performing treatments and interventions, ordering and review of laboratory studies, ordering and review of radiographic studies, pulse oximetry and re-evaluation of patient's condition.    Kipp Brood, MD Staff Critical Care Physician [facility] Pager: [phone] Off hours: [phone]

## 2017-09-30 NOTE — Progress Notes (Signed)
Pt heart rhythm showed v fib on monitor. CPR started, see code documentation.

## 2017-09-30 NOTE — Significant Event (Cosign Needed Addendum)
PATIENT NAME: Luke Kaufman MEDICAL RECORD NUMBER: [id number] Birthday: July 12, 1950  Age: 67 y.o. Admit Date: 09/03/2017  Provider: CCM  Indication: Vfib arrest   Technical Description: RN reports patient became tachycardic and tachypneic prior 1-2 hours prior to arrest.  Was given fentanyl for sedation.   CPR performance duration: 2130 - 2141  Was defibrillation or cardioversion used ? yes  Was external pacer placed ? yes  Was patient intubated pre/post CPR ? Yes/ trach/ vent dependent  Was transvenous pacer placed ? no  Medications Administered Include      Yes/no Amiodarone      X 1  Atropin   Calcium   Epinephrine      x4  Lidocaine   Magnesium   Norepinephrine     yes  Phenylephrine   Sodium bicarbonate     x1  Vasopression    Evaluation  Final Status - Was patient successfully resuscitated ? Yes  If successfully resuscitated - what is current rhythm ? ST If successfully resuscitated - what is current hemodynamic status ? Hypotensive, starting pressors  Miscellaneous Information  CVP 18-20 No significant electrolyte abnormality on am labs Patient wake post code, moving R side, not following commands  ABG noted at 7.181/ 44/ 82/16.3-  RR increased to 24 on MV and additional amp Bicarb  CXR, EKG, stat CBC and CMET ordered.     CCT 30 mins   Kennieth Rad, AGACNP-BC [place] Pulmonary & Critical Care Pgr: [phone] or if no answer [phone] 09/30/2017, 10:02 PM

## 2017-09-30 NOTE — Progress Notes (Signed)
[facility]/Trauma Progress Note  7 Days Post-Op   Assessment/Plan Principal Problem: Mesenteric ischemia (HCC) Active Problems: Essential hypertension Arterial atherosclerosis HLD (hyperlipidemia) Abdominal pain PUD (peptic ulcer disease) Occlusion of superior mesenteric artery (HCC) Respiratory failure (HCC) Acute respiratory failure with hypoxemia (HCC)  Mesenteric ischemia 1.Exploratory laparotomy right hemicolectomy 09/04/2017 Dr. Stark Klein 2.Abdominal exploration, application of temporary abdominal closure device(ABTHERA), 09/04/2017 Dr. Nadeen Landau 3.Oratory laparotomy, ileostomy, application of open abdominal wound VAC, 09/06/2017 Dr. Georganna Skeans 4. Open abdomen for attempted closure, 09/08/2017 Dr. Judeth Horn 5. Oratory laparotomy with possible wound closure(prior sutures upper&lower portion of the fascia had broken), 09/10/2017 Dr. Judeth Horn 6. Reexploration open abdominal wound, application ofABTHERAWOUND VAC,09/14/2017 Dr. Greer Pickerel 7. Exploratory laparotomy and partial closure of abdomen using the elastic tension closure device (ABRA), Dr. Hulen Skains, 05/29 8. exploratory laparotomy and dressing change, Dr. Ninfa Linden, 05/31 9. Changed negative pressure dressing, Dr. Kieth Brightly, 06/03 10.reopening of recent laparotomy, incisional hernia repair, insertion of mesh, placement of negative pressure dressing, Dr. Kieth Brightly, 06/04 11. Bedside vac change, Dr. Kieth Brightly, 06/07  -no fevers and WBC normal   BJY:NWGN feeds VTE: SCD's FA:OZHY currently Foley:yes Follow up:TBD  DISPO:vac changetomorrowat bedside by surgery team. added anti-diarrheal. Will change NGT to cortrak tomorrow   LOS: 26 days    Subjective: CC: open abdomen  On vent, awake  Objective: Vital signs in last 24 hours: Temp:  [97.8 F (36.6 C)-99.5 F (37.5 C)] 99.2 F (37.3 C) (06/11 0734) Pulse Rate:  [55-56] 56 (06/10  1515) Resp:  [15-30] 15 (06/11 0800) BP: (96-141)/(44-78) 128/59 (06/11 0700) SpO2:  [100 %] 100 % (06/11 0800) FiO2 (%):  [40 %] 40 % (06/11 0800) Weight:  [110.9 kg (244 lb 7.8 oz)] 110.9 kg (244 lb 7.8 oz) (06/11 0600) Last BM Date: 09/25/17  Intake/Output from previous day: 06/10 0701 - 06/11 0700 In: 2633.1 [I.V.:702.3; NG/GT:1580.8; IV Piggyback:350] Out: 8657 [Urine:1550; Drains:220; QIONG:2952] Intake/Output this shift: No intake/output data recorded.  PE: Gen:On vent, awake Card:mild bradycardia Pulm:On vent WUX:LKGMW vac. Tan liquid stool in ostomy bag Neuro: following some commands Skin: warm and dry    Anti-infectives: Anti-infectives (From admission, onward)   Start     Dose/Rate Route Frequency Ordered Stop   09/19/17 0730  ceFAZolin (ANCEF) IVPB 2g/100 mL premix     2 g 200 mL/hr over 30 Minutes Intravenous On call to O.R. 09/19/17 0715 09/19/17 1057   09/17/17 1145  ceFAZolin (ANCEF) 3 g in dextrose 5 % 50 mL IVPB     3 g 100 mL/hr over 30 Minutes Intravenous To ShortStay Surgical 09/17/17 0923 09/17/17 1449   09/07/17 1545  cefTRIAXone (ROCEPHIN) 2 g in sodium chloride 0.9 % 100 mL IVPB     2 g 200 mL/hr over 30 Minutes Intravenous Every 24 hours 09/07/17 1543 09/11/17 1605   09/04/17 1400  piperacillin-tazobactam (ZOSYN) IVPB 3.375 g  Status:  Discontinued     3.375 g 12.5 mL/hr over 240 Minutes Intravenous Every 8 hours 09/04/17 1157 09/07/17 1541   09/04/17 1100  ceFAZolin (ANCEF) IVPB 2g/100 mL premix  Status:  Discontinued     2 g 200 mL/hr over 30 Minutes Intravenous Every 8 hours 09/04/17 1035 09/04/17 1157   09/04/17 0630  piperacillin-tazobactam (ZOSYN) IVPB 3.375 g  Status:  Discontinued     3.375 g 100 mL/hr over 30 Minutes Intravenous  Once 09/04/17 0617 09/04/17 0626   09/04/17 0619  piperacillin-tazobactam (ZOSYN) IVPB 3.375 g     3.375 g 100  mL/hr over 30 Minutes Intravenous 60 min pre-op 09/04/17 0620 09/04/17 0717   09/04/17  0030  piperacillin-tazobactam (ZOSYN) IVPB 3.375 g     3.375 g 100 mL/hr over 30 Minutes Intravenous  Once 09/04/17 0024 09/04/17 0144      Lab Results:  Recent Labs    09/28/17 0359 09/29/17 0331  WBC 9.3 7.8  HGB 8.6* 9.8*  HCT 28.4* 32.6*  PLT 286 238   BMET Recent Labs    09/28/17 0359 09/29/17 0331  NA 146* 146*  K 3.8 3.8  CL 117* 117*  CO2 21* 20*  GLUCOSE 132* 136*  BUN 41* 42*  CREATININE 1.30* 1.36*  CALCIUM 7.6* 7.6*   PT/INR No results for input(s): LABPROT, INR in the last 72 hours. CMP     Component Value Date/Time   NA 146 (H) 09/29/2017 0331   K 3.8 09/29/2017 0331   CL 117 (H) 09/29/2017 0331   CO2 20 (L) 09/29/2017 0331   GLUCOSE 136 (H) 09/29/2017 0331   BUN 42 (H) 09/29/2017 0331   CREATININE 1.36 (H) 09/29/2017 0331   CALCIUM 7.6 (L) 09/29/2017 0331   PROT 5.3 (L) 09/28/2017 0359   ALBUMIN 1.3 (L) 09/28/2017 0359   AST 56 (H) 09/28/2017 0359   ALT 38 09/28/2017 0359   ALKPHOS 107 09/28/2017 0359   BILITOT 0.5 09/28/2017 0359   GFRNONAA 52 (L) 09/29/2017 0331   GFRAA >60 09/29/2017 0331   Lipase     Component Value Date/Time   LIPASE 37 09/03/2017 1644    Studies/Results: No results found.    Kalman Drape , [facility] 09/30/2017, 8:47 AM  Pager: [phone] Mon-Wed, Friday 7:00am-4:30pm Thurs 7am-11:30am  Consults: [phone]

## 2017-09-30 NOTE — Procedures (Signed)
Arterial Catheter Insertion Procedure Note Luke Kaufman [id number] 01-31-1951  Procedure: Insertion of Arterial Catheter  Indications: Blood pressure monitoring and Frequent blood sampling  Procedure Details Consent: Unable to obtain consent because of emergent medical necessity. Time Out: Verified patient identification, verified procedure, site/side was marked, verified correct patient position, special equipment/implants available, medications/allergies/relevent history reviewed, required imaging and test results available.  Performed  Maximum sterile technique was used including antiseptics, cap, gloves, gown, hand hygiene, mask and sheet. Skin prep: Chlorhexidine; local anesthetic administered 20 gauge catheter was inserted into right radial artery using the Seldinger technique. ULTRASOUND GUIDANCE USED: YES Evaluation Blood flow good; BP tracing good. Complications: No apparent complications.   Kennieth Rad, AGACNP-BC [facility] Pgr: [phone] or if no answer [phone] 09/30/2017, 11:04 PM

## 2017-09-30 NOTE — Significant Event (Addendum)
  Patient Name: Petros Ahart   MRN: [id number]   Date of Birth/ Sex: 10-12-1950 , male      Admission Date: 09/03/2017  Attending Provider: Rush Farmer, MD  Primary Diagnosis: Mesenteric ischemia [facility])   Indication: Pt was in his usual state of health until this PM, when he was noted to be V-Fib. Code blue was subsequently called. At the time of arrival on scene, ACLS protocol was underway.   Technical Description:  - CPR performance duration:  11 minutes (2130-2141)  - Was defibrillation or cardioversion used? Yes x2 (2134, 2139)  - Was external pacer placed? No  - Was patient intubated pre/post CPR? Yes, patient already intubated prior to CPR   Medications Administered: Y = Yes; Blank = No Amiodarone  Y  Atropine    Calcium    Epinephrine  Y x4  Lidocaine    Magnesium    Norepinephrine    Phenylephrine    Sodium bicarbonate  Y x1  Vasopressin     Post CPR evaluation:  - Final Status - Was patient successfully resuscitated ? Yes - What is current rhythm? Tachycardia with possible atrial fibrillation on monitor - EKG pending - What is current hemodynamic status? Stable, HR 100-130 with last BP 110s/80s  Miscellaneous Information:  - Labs sent, including: CBC, CMP, ABG, EKG, portable chest X ray (per primary team)  - Primary team notified?  Yes - PCCM listed as primary, E-Link and PCCM team members bedside during code  - Family Notified? No  - Additional notes/ transfer status: Patient to remain in ICU     Thomasene Ripple, MD  09/30/2017, 10:05 PM

## 2017-09-30 NOTE — Progress Notes (Addendum)
PULMONARY / CRITICAL CARE MEDICINE   Name: Luke Kaufman MRN: [id number] DOB: 07-15-1950    ADMISSION DATE:  09/03/2017 CONSULTATION DATE:  5/16  REFERRING MD:  Oneida Alar  Interval Hx: Remains afebrile EEG reviewed prior to arrest- patient with ST with 3-4 beat run of vtach then vfib, prior to that noted for frequent PACs, PVCs, short runs of Vtach and bigeminy S/p 11 min Vfib arrest, defib x 2, post rosc- Afib, tachypneic, decreased movement on LUE/ LLE, hypotensive requiring pressor support  CHIEF COMPLAINT:  Post Op Vent management  Brief history:   67 y/o male presented on 5/15 with abdominal pain fond to have ischemic right colon in setting of SMA occlusion requiring emergent bypass and eventually R hemicolectomy and ilestomy. Fascia remains open, agitation barrier to weaning. Trached 5/24., to return to OR 5/26, 5/29 now with wound vac   STUDIES:  CT ab/pelvis 5/15 > portal venous gas in liver, sigmoid colon with diverticular disease, atrophic left kidney  CULTURES: 5/17 resp > e coli resistant to multiple antibiotics 5/17 blood > negative  ANTIBIOTICS: 5/15 zosyn > 5/19  5/19 ceftriaxone > 5/23   LINES/TUBES: 5/16 R [place] CVL > 5/24 5/16 ETT > 5/24 5/24 Trach > 6/10 prox XLT placed >>> 5/18 R femoral arterial line > 5/24 5/24 LUA PICC   SIGNIFICANT EVENTS: 5/16 mesenteric angiogram and aorto to SMA bypass 5/16 R hemicolectomy 5/18 ileostomy 5/19 abdominal washout and partial fascial closure 5/21 TF started, hypoxia with turning  5/22 Return to OR for exploration, attempted closure > unable 5/24 Trach 5/26 OR for debridement and dressing change 5/29 Exploratory laparotomy, placement of ABBRA closure system 5/31 - No events overnight. Plans to return to OR for dressing change  6/11- tolerating SBT/ off sedation; Vfib arrest that evening  1. Exploratory laparotomy right hemicolectomy 09/04/2017 Dr. Stark Klein 2.Abdominal exploration, application of temporary  abdominal closure device(ABTHERA), 09/04/2017 Dr. Nadeen Landau 3.Oratory laparotomy, ileostomy, application of open abdominal wound VAC, 09/06/2017 Dr. Georganna Skeans 4. Open abdomen for attempted closure, 09/08/2017 Dr. Judeth Horn 5. Oratory laparotomy with possible wound closure(prior sutures upper&lower portion of the fascia had broken), 09/10/2017 Dr. Judeth Horn 6. Reexploration open abdominal wound, application ofABTHERAWOUND VAC,09/14/2017 Dr. Greer Pickerel 7. Exploratory laparotomy and partial closure of abdomen using the elastic tension closure device (ABRA), Dr. Hulen Skains, 05/29 8. exploratory laparotomy and dressing change, Dr. Ninfa Linden, 05/31 9. Changed negative pressure dressing, Dr. Kieth Brightly, 06/03 10. reopening of recent laparotomy, incisional hernia repair, insertion of mesh, placement of negative pressure dressing, Dr. Kieth Brightly, 06/04  11. Bedside vac change, Dr. Kieth Brightly, 06/07 12. 6/10 bedside vac change, trach change  SUBJECTIVE/OVERNIGHT/INTERVAL HX Persistent cuff leak, planning for bedside vac change today by surgery. Ostomy remains high output.   VITAL SIGNS: BP (!) 121/54   Pulse 65   Temp 99.7 F (37.6 C) (Oral)   Resp (!) 27   Ht 5\' 7"  (1.702 m)   Wt 244 lb 7.8 oz (110.9 kg)   SpO2 98%   BMI 38.29 kg/m   HEMODYNAMICS: CVP:  [8 mmHg-18 mmHg] 18 mmHg  VENTILATOR SETTINGS: Vent Mode: PRVC FiO2 (%):  [40 %-100 %] 100 % Set Rate:  [20 bmp-24 bmp] 24 bmp Vt Set:  [530 mL] 530 mL PEEP:  [5 cmH20] 5 cmH20 Pressure Support:  [10 cmH20-12 cmH20] 10 cmH20 Plateau Pressure:  [12 cmH20-14 cmH20] 12 cmH20  INTAKE / OUTPUT: I/O last 3 completed shifts: In: 3947.7 [I.V.:916.9; NG/GT:2330.8; IV Piggyback:700] Out: 4128 [Urine:2310; Drains:420; NOMVE:7209]  Physical exam  General:  Critically ill appearing male tachypneic on MV HEENT: MM pink/moist, pupils 3/reactive, trach midline- no reports of mucous plugs Neuro: awake, tracks, moving R side spont,  not following commands at this time, wiggling left toes but no other left sided movement noted CV: IRIR, diminished pulses  PULM: even/tachypneic 30-40's on MV, coarse throughout GI: distended, wound vac in place- no significant output, R ostomy- with high output, uro-bag in place- clear urine  Extremities: cool /dry, generalized edema, LUE edematous  Skin: no rashes    PULMONARY Recent Labs  Lab 09/28/17 0359 09/30/17 2157 09/30/17 2250  PHART 7.465* 7.181* 7.419  PCO2ART 27.6* 44.1 32.2  PO2ART 102 82.0* 355*  HCO3 19.5* 16.3* 20.2  TCO2  --  18*  --   O2SAT 97.6 92.0 99.6    CBC Recent Labs  Lab 09/29/17 0331 09/30/17 1134 09/30/17 2220  HGB 9.8* 7.9* 9.1*  HCT 32.6* 26.4* 30.8*  WBC 7.8 7.7 7.8  PLT 238 273 323    COAGULATION No results for input(s): INR in the last 168 hours.  CARDIAC   Recent Labs  Lab 09/30/17 2221  TROPONINI 0.06*   No results for input(s): PROBNP in the last 168 hours.   CHEMISTRY Recent Labs  Lab 09/24/17 0234  09/27/17 0339 09/28/17 0359 09/29/17 0331 09/30/17 1134 09/30/17 2220  NA 140   < > 145 146* 146* 149* 149*  K 4.3   < > 4.4 3.8 3.8 3.5 3.9  CL 116*   < > 118* 117* 117* 120* 116*  CO2 18*   < > 19* 21* 20* 22 17*  GLUCOSE 110*   < > 123* 132* 136* 122* 159*  BUN 23*   < > 44* 41* 42* 40* 37*  CREATININE 1.37*   < > 1.69* 1.30* 1.36* 1.36* 1.53*  CALCIUM 6.9*   < > 7.7* 7.6* 7.6* 7.9* 7.9*  MG 1.8  --   --   --  2.3 2.5*  --   PHOS 2.9  --   --   --   --  3.2  --    < > = values in this interval not displayed.   Estimated Creatinine Clearance: 55.7 mL/min (A) (by C-G formula based on SCr of 1.53 mg/dL (H)).   LIVER Recent Labs  Lab 09/26/17 0543 09/27/17 0339 09/28/17 0359 09/30/17 1134 09/30/17 2220  AST 83* 64* 56* 28 54*  ALT 39 41 38 24 30  ALKPHOS 127* 117 107 81 117  BILITOT 0.4 0.6 0.5 0.5 0.7  PROT 5.0* 5.2* 5.3* 5.9* 5.7*  ALBUMIN 1.2* 1.3* 1.3* 2.4* 2.8*     INFECTIOUS Recent Labs   Lab 09/26/17 1133 09/27/17 0339 09/28/17 0359 09/30/17 2221  LATICACIDVEN 1.4  --   --   --   PROCALCITON 0.59 0.47 0.31 0.23     ENDOCRINE CBG (last 3)  Recent Labs    09/30/17 1157 09/30/17 1701 09/30/17 1952  GLUCAP 122* 112* 76     IMAGING x48h  - image(s) personally visualized  -   highlighted in bold Dg Chest Port 1 View  Result Date: 09/30/2017 CLINICAL DATA:  Cardiac arrest EXAM: PORTABLE CHEST 1 VIEW COMPARISON:  Chest radiograph 09/27/2017 FINDINGS: Unchanged cardiomegaly. Left-sided PICC line tip is at the cavoatrial junction. Ventilation tube tip is just below the level of the clavicular heads, well above the carina. Gastric tube courses below the diaphragm. No focal airspace consolidation. There is pulmonary venous apical redistribution with  probable mild interstitial edema. No pleural effusion or pneumothorax. IMPRESSION: 1. Cardiomegaly and suspected early interstitial pulmonary edema. 2. Unchanged support apparatus. Electronically Signed   By: Ulyses Jarred M.D.   On: 09/30/2017 22:31   DISCUSSION: 67 y/o male with an SMA occlusion requiring emergent bypass and eventually R hemicolectomy and ilestomy is gone back to the operating room several times for washouts as well as working on abdominal closure.  Most recently had mesh placed.  He is been on IV sedation for a prolonged period of time and is at a very high risk for prolonged encephalopathy.  Progressive day weaning on MV and off sedation 6/11.  Subsequent Vfib arrest 6/11  ASSESSMENT / PLAN:  Active issues:  Ischemic bowel, s/p resection with hemicolectomy and ileostomy; now status post several washouts.  Severe protein calorie malnutrition Plan - TF/Wound care per surgery  Acute respiratory failure with hypoxemia with acute pulmonary edema  Status post tracheostomy 5/24 H/O COPD - Continuing full vent support - Weaning trials PS as tolerated - trend CXR and ABG  Vfib arrest 6/11 - unclear etiology  at this point- concern for embolic event, given hx, no significant electrolytes abnormalities on labs, EKG non-acute but new Afib New onset Afib 6/11 Shock -6/11, concern for cardiogenic, does not appear to be septic related given -PCT/ afebrile/ no change in WBC History of coronary artery disease, prior MI, hypertension and peripheral vascular disease Plan - Continue telemetry monitoring - continue levophed for goal MAP > 65 - s/p aline  - trend CVP, lactate, troponin - assess cortisol  - continue amio gtt for now, QTc on EKG 0.417 - TTE in am - CTA chest r/o PE given ST, tachypnea, new onset afib, change in CVP from 8 -> 18, cardiac arrest, LUE edematous- suspcious for DVT - will need cardiology consult in am for Afib, high risk for systemic anticoagulation - Statin - monitor electrolytes   Acute kidney injury, anasarca Hypernatremia/hyperchloremia.  Hypocalcemia: pseudo - hold further diuresis at this time given hypotension - trend electrolytes  - continue free water   Acute encephalopathy w/ hypoactive delirium Left sided weakness- pronounced after cardiac arrest  Plan - CT head now - limit sedation as able - RASS goal -1 - fentanyl gtt and prn versed - Seroquel nightly, monitor QTc  Anemia of critical illness - No evidence of active bleeding currently Plan Trend CBC Transfuse per protocol for hemoglobin less than 7  Mild leukocytosis, and temperature - resolved  Plan Trend CBC and fever curve Surgery with plans to change wound vac at bedside 6/12  Hyperglycemia Plan Continue sliding scale insulin  DVT prophylaxis: SCD/ sq heparin SUP: H2B IV for now Diet: holding TF for now Activity: bedrest  Disposition : ICU  FAMILY  - Updates: Sister, Bethena Roys called and updated on tonight's events.  She was shocked in that he had such a good, progressive day and is still processing information.  Will need to continue ongoing goals of care discussions, given this setback in  his prolonged, complicated hospitalization.  Kennieth Rad, AGACNP-BC [facility] Pgr: [phone] or if no answer [phone] 09/30/2017, 11:25 PM

## 2017-09-30 NOTE — Consult Note (Addendum)
[place] Nurse ostomy follow up Surgical team is following and performing Vac changes to abd wound; please refer to them for further questions regarding plan of care.  Stoma type/location:Ileostomy stoma to RLQ; previous slough has almost resolved; 95% red, 5% yellow, 1 1/2 inches, flush with skin level. Vac is near the stoma and there are patchy areas of MARSI related to the drape .   Output: Large amt yellow liquid stool Ostomy pouching: 2 piece pouching system applied with barrier ring to attempt to maintain a seal, attached high output spout to bedside drainage bag. Supplies at the bedside for staff nurse use. Education provided:No family present and pt is critically ill. Enrolled patient in [place] Start Discharge program: No WOC team will continue to follow and begin teaching sessions when stable and out of ICU. Julien Girt MSN, RN, [place], [place], [place]

## 2017-10-01 ENCOUNTER — Inpatient Hospital Stay (HOSPITAL_COMMUNITY): Payer: PPO

## 2017-10-01 DIAGNOSIS — J9601 Acute respiratory failure with hypoxia: Secondary | ICD-10-CM

## 2017-10-01 DIAGNOSIS — I2699 Other pulmonary embolism without acute cor pulmonale: Secondary | ICD-10-CM

## 2017-10-01 LAB — LACTIC ACID, PLASMA: LACTIC ACID, VENOUS: 1.2 mmol/L (ref 0.5–1.9)

## 2017-10-01 LAB — CBC WITH DIFFERENTIAL/PLATELET
Abs Immature Granulocytes: 0.2 10*3/uL — ABNORMAL HIGH (ref 0.0–0.1)
BASOS PCT: 0 %
Basophils Absolute: 0 10*3/uL (ref 0.0–0.1)
EOS ABS: 0 10*3/uL (ref 0.0–0.7)
EOS PCT: 0 %
HEMATOCRIT: 26.7 % — AB (ref 39.0–52.0)
Hemoglobin: 8.2 g/dL — ABNORMAL LOW (ref 13.0–17.0)
Immature Granulocytes: 1 %
LYMPHS ABS: 1.2 10*3/uL (ref 0.7–4.0)
Lymphocytes Relative: 8 %
MCH: 28.4 pg (ref 26.0–34.0)
MCHC: 30.7 g/dL (ref 30.0–36.0)
MCV: 92.4 fL (ref 78.0–100.0)
MONO ABS: 0.6 10*3/uL (ref 0.1–1.0)
MONOS PCT: 4 %
Neutro Abs: 13.5 10*3/uL — ABNORMAL HIGH (ref 1.7–7.7)
Neutrophils Relative %: 87 %
PLATELETS: 297 10*3/uL (ref 150–400)
RBC: 2.89 MIL/uL — ABNORMAL LOW (ref 4.22–5.81)
RDW: 15.3 % (ref 11.5–15.5)
WBC: 15.6 10*3/uL — ABNORMAL HIGH (ref 4.0–10.5)

## 2017-10-01 LAB — COMPREHENSIVE METABOLIC PANEL
ALK PHOS: 109 U/L (ref 38–126)
ALT: 37 U/L (ref 17–63)
ANION GAP: 9 (ref 5–15)
AST: 61 U/L — ABNORMAL HIGH (ref 15–41)
Albumin: 2.6 g/dL — ABNORMAL LOW (ref 3.5–5.0)
BILIRUBIN TOTAL: 0.9 mg/dL (ref 0.3–1.2)
BUN: 39 mg/dL — ABNORMAL HIGH (ref 6–20)
CALCIUM: 7.6 mg/dL — AB (ref 8.9–10.3)
CO2: 22 mmol/L (ref 22–32)
Chloride: 119 mmol/L — ABNORMAL HIGH (ref 101–111)
Creatinine, Ser: 1.57 mg/dL — ABNORMAL HIGH (ref 0.61–1.24)
GFR, EST AFRICAN AMERICAN: 51 mL/min — AB (ref >60)
GFR, EST NON AFRICAN AMERICAN: 44 mL/min — AB (ref >60)
Glucose, Bld: 117 mg/dL — ABNORMAL HIGH (ref 65–99)
Potassium: 3.3 mmol/L — ABNORMAL LOW (ref 3.5–5.1)
Sodium: 150 mmol/L — ABNORMAL HIGH (ref 135–145)
TOTAL PROTEIN: 5.4 g/dL — AB (ref 6.5–8.1)

## 2017-10-01 LAB — POCT I-STAT 3, ART BLOOD GAS (G3+)
ACID-BASE DEFICIT: 4 mmol/L — AB (ref 0.0–2.0)
Bicarbonate: 19.8 mmol/L — ABNORMAL LOW (ref 20.0–28.0)
O2 Saturation: 99 %
PO2 ART: 127 mmHg — AB (ref 83.0–108.0)
TCO2: 21 mmol/L — ABNORMAL LOW (ref 22–32)
pCO2 arterial: 28.1 mmHg — ABNORMAL LOW (ref 32.0–48.0)
pH, Arterial: 7.458 — ABNORMAL HIGH (ref 7.350–7.450)

## 2017-10-01 LAB — GLUCOSE, CAPILLARY
GLUCOSE-CAPILLARY: 108 mg/dL — AB (ref 65–99)
GLUCOSE-CAPILLARY: 134 mg/dL — AB (ref 65–99)
Glucose-Capillary: 102 mg/dL — ABNORMAL HIGH (ref 65–99)
Glucose-Capillary: 109 mg/dL — ABNORMAL HIGH (ref 65–99)
Glucose-Capillary: 107 mg/dL — ABNORMAL HIGH (ref 65–99)

## 2017-10-01 LAB — PROCALCITONIN: Procalcitonin: 1.47 ng/mL

## 2017-10-01 LAB — HEPARIN LEVEL (UNFRACTIONATED)
Heparin Unfractionated: 1.38 IU/mL — ABNORMAL HIGH (ref 0.30–0.70)
Heparin Unfractionated: 0.18 [IU]/mL — ABNORMAL LOW (ref 0.30–0.70)
Heparin Unfractionated: 0.25 IU/mL — ABNORMAL LOW (ref 0.30–0.70)

## 2017-10-01 LAB — ECHOCARDIOGRAM COMPLETE
HEIGHTINCHES: 67 in
WEIGHTICAEL: 4028.25 [oz_av]

## 2017-10-01 LAB — MAGNESIUM
MAGNESIUM: 2.3 mg/dL (ref 1.7–2.4)
Magnesium: 2.1 mg/dL (ref 1.7–2.4)

## 2017-10-01 LAB — CALCIUM, IONIZED: Calcium, Ionized, Serum: 3.6 mg/dL — ABNORMAL LOW (ref 4.5–5.6)

## 2017-10-01 LAB — CORTISOL: CORTISOL PLASMA: 14.3 ug/dL

## 2017-10-01 LAB — TROPONIN I
TROPONIN I: 1.74 ng/mL — AB (ref <0.03)
Troponin I: 1.36 ng/mL

## 2017-10-01 MED ORDER — CHLORHEXIDINE GLUCONATE CLOTH 2 % EX PADS
6.0000 | MEDICATED_PAD | Freq: Every day | CUTANEOUS | Status: DC
  Administered 2017-10-01 – 2017-10-09 (×9): 6 via TOPICAL

## 2017-10-01 MED ORDER — HEPARIN BOLUS VIA INFUSION
4000.0000 [IU] | Freq: Once | INTRAVENOUS | Status: AC
  Administered 2017-10-01: 4000 [IU] via INTRAVENOUS
  Filled 2017-10-01: qty 4000

## 2017-10-01 MED ORDER — IOPAMIDOL (ISOVUE-370) INJECTION 76%
100.0000 mL | Freq: Once | INTRAVENOUS | Status: AC | PRN
  Administered 2017-10-01: 52 mL via INTRAVENOUS

## 2017-10-01 MED ORDER — HEPARIN (PORCINE) IN NACL 100-0.45 UNIT/ML-% IJ SOLN
2600.0000 [IU]/h | INTRAMUSCULAR | Status: DC
  Administered 2017-10-01: 1450 [IU]/h via INTRAVENOUS
  Administered 2017-10-02: 1950 [IU]/h via INTRAVENOUS
  Administered 2017-10-02: 2200 [IU]/h via INTRAVENOUS
  Administered 2017-10-03: 2400 [IU]/h via INTRAVENOUS
  Administered 2017-10-03: 2300 [IU]/h via INTRAVENOUS
  Administered 2017-10-04 – 2017-10-09 (×9): 2600 [IU]/h via INTRAVENOUS
  Filled 2017-10-01 (×21): qty 250

## 2017-10-01 MED ORDER — DEXTROSE 5 % IV SOLN
INTRAVENOUS | Status: DC
  Administered 2017-10-01: 10:00:00 via INTRAVENOUS

## 2017-10-01 MED ORDER — POTASSIUM CHLORIDE 10 MEQ/50ML IV SOLN
10.0000 meq | INTRAVENOUS | Status: AC
  Administered 2017-10-01 (×4): 10 meq via INTRAVENOUS
  Filled 2017-10-01 (×4): qty 50

## 2017-10-01 MED ORDER — HYDROMORPHONE HCL 2 MG PO TABS
1.0000 mg | ORAL_TABLET | ORAL | Status: DC | PRN
  Administered 2017-10-03 – 2017-10-05 (×6): 1 mg via ORAL
  Filled 2017-10-01 (×3): qty 1

## 2017-10-01 MED ORDER — HYDROMORPHONE HCL 2 MG PO TABS
1.0000 mg | ORAL_TABLET | ORAL | Status: DC
  Administered 2017-10-01 – 2017-10-06 (×29): 1 mg via ORAL
  Filled 2017-10-01 (×31): qty 1

## 2017-10-01 MED ORDER — HYDROMORPHONE HCL 1 MG/ML PO LIQD: 1.5000 mg | ORAL | Status: DC | PRN

## 2017-10-01 MED ORDER — HYDROMORPHONE HCL 1 MG/ML PO LIQD: 1.5000 mg | ORAL | Status: DC

## 2017-10-01 MED ORDER — HEPARIN BOLUS VIA INFUSION
2500.0000 [IU] | Freq: Once | INTRAVENOUS | Status: AC
  Administered 2017-10-01: 2500 [IU] via INTRAVENOUS
  Filled 2017-10-01: qty 2500

## 2017-10-01 MED FILL — Medication: Qty: 1 | Status: AC

## 2017-10-01 NOTE — Progress Notes (Signed)
PCCM Interval Note  Called by radiology with CTA chest findings of acute LLL PE, submassive, RV/LV ratio 0.89.  Head CT neg for acute findings.  Spoke with Dr. Georgette Dover w/CCS regarding systemic heparin with open abdomen.  He is ok to start heparin including bolus.  This will additionally help given new onset of afib, which appears to be intermittent between ST with PACs.  P:  D/c sq heparin and SCDs Heparin per pharmacy Vascular dopplers of all extremities to r/o DVT  Luke Kaufman, AGACNP-BC [facility] Pgr: [phone] or if no answer [phone] 10/01/2017, 2:10 AM

## 2017-10-01 NOTE — Progress Notes (Signed)
  Echocardiogram 2D Echocardiogram has been performed.  Luke Kaufman 10/01/2017, 4:54 PM

## 2017-10-01 NOTE — Procedures (Signed)
Operative note  Indications for procedure: VAC dressing on abdominal wound needs to be changed  Preoperative diagnosis     VAC in place on abdominal wound  Postoperative diagnosis:  No evidence of fistula or enteric content leak  Procedure: Bedside VAC change 355cm squared  Surgeon: Georganna Skeans, MD  Assistant: Jackson Latino, PA-C  Procedure in detail: We did a time out. He received IV pain medicine. The old VAC sponge was removed.  There was miniimal fibrinous exudate around the edges of the fascia but no evidence of enteric content leak or fistula.  We fashioned a new VAC dressing using 2 large VAC sponges cut to shape to totally cover the area.  355 cm.  The VAC drape was  applied and we hooked up the back suction pump.  Excellent seal was obtained.  He tolerated the procedure well.  He remained monitored in the intensive care unit throughout.  Georganna Skeans, MD, MPH, FACS Trauma: [phone] General Surgery: [phone]

## 2017-10-01 NOTE — Progress Notes (Signed)
ANTICOAGULATION CONSULT NOTE - Initial Consult  Pharmacy Consult for Heparin Indication: pulmonary embolus  No Known Allergies  Patient Measurements: Height: 5\' 7"  (170.2 cm) Weight: 244 lb 7.8 oz (110.9 kg) IBW/kg (Calculated) : 66.1 Heparin Dosing Weight: 90 kg  Vital Signs: Temp: 99.2 F (37.3 C) (06/12 0030) Temp Source: Oral (06/12 0030) BP: 139/46 (06/11 2335) Pulse Rate: 96 (06/11 2335)  Labs: Recent Labs    09/29/17 0331 09/30/17 1134 09/30/17 2220 09/30/17 2221  HGB 9.8* 7.9* 9.1*  --   HCT 32.6* 26.4* 30.8*  --   PLT 238 273 323  --   CREATININE 1.36* 1.36* 1.53*  --   TROPONINI  --   --   --  0.06*    Estimated Creatinine Clearance: 55.7 mL/min (A) (by C-G formula based on SCr of 1.53 mg/dL (H)).   Medical History: Past Medical History:  Diagnosis Date  . Gastric ulcer   . Hyperlipemia   . Hypertension   . MI (myocardial infarction) ([place])   . Stroke ([place])   . Tobacco abuse     Medications:  Medications Prior to Admission  Medication Sig Dispense Refill Last Dose  . amLODipine (NORVASC) 10 MG tablet Take 1 tablet (10 mg total) by mouth daily. 30 tablet 0 09/03/2017 at Unknown time  . aspirin EC 81 MG tablet Take 81 mg by mouth every 6 (six) hours as needed for moderate pain.   09/02/2017 at Unknown time  . atorvastatin (LIPITOR) 40 MG tablet Take 40 mg by mouth daily.  1 09/03/2017 at Unknown time  . gemfibrozil (LOPID) 600 MG tablet Take 600 mg by mouth 2 (two) times daily before a meal.   1 09/03/2017 at Unknown time  . metoprolol tartrate (LOPRESSOR) 100 MG tablet Take 100 mg by mouth 2 (two) times daily.  1 09/03/2017 at 0900  . triamterene-hydrochlorothiazide (MAXZIDE-25) 37.5-25 MG tablet TAKE 1 TABLET BY MOUTH DAILY   09/03/2017 at Unknown time    Assessment: 67 y.o. male admitted 5/16 s/p multiple abdominal surgeries and tracheostomy, now s/p VFib arrest tonight and found to have PE, for heparin Goal of Therapy:  Heparin level 0.3-0.7  units/ml Monitor platelets by anticoagulation protocol: Yes   Plan:  Heparin 4000 units IV bolus, then start heparin 1450 units/hr Check heparin level in 8 hours.   Caryl Pina 10/01/2017,2:03 AM

## 2017-10-01 NOTE — Progress Notes (Addendum)
Preliminary notes--Bilateral lower extremities venous duplex exam completed. Positive for acute DVT involving Left CFV, SFJ, FV prox partially thrombosed.  Other veins appear patent.   Hongying Landry Mellow (RDMS RVT) 10/01/17 12:34 PM

## 2017-10-01 NOTE — Progress Notes (Signed)
Preliminary notes--Bilateral upper extremities venous duplex exam completed.  Positive for acute DVT involving Right Brachial vein, Left subclavian vein, Axillary vein, Cephalic and  Basilic vein.  Result notified RN.    Hongying Landry Mellow (RDMS RVT) 10/01/17 12:46 PM

## 2017-10-01 NOTE — Progress Notes (Signed)
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 Fascia remains open, agitation barrier to weaning. Trached 5/24., to return to OR 5/26, 5/29 now with wound vac   STUDIES:  CT ab/pelvis 5/15 > portal venous gas in liver, sigmoid colon with diverticular disease, atrophic left kidney CT head 6/12 > chronic ischemia, right maxillary sinus fluid level and bilateral mastoid and middle ear effusions. CTA chest 6/12 > LLL PE (RV / LV 0.89).  CULTURES: 5/17 resp > e coli resistant to multiple antibiotics 5/17 blood > negative  ANTIBIOTICS: 5/15 zosyn > 5/19  5/19 ceftriaxone > 5/23   LINES/TUBES: 5/16 R Faith CVL > 5/24 5/16 ETT > 5/24 5/24 Trach > 6/10 prox XLT placed >>> 5/18 R femoral arterial line > 5/24 5/24 LUA PICC   SIGNIFICANT EVENTS: 5/16 mesenteric angiogram and aorto to SMA bypass 5/16 ex lap R hemicolectomy 5/18 ileostomy 5/19 abdominal washout and partial fascial closure 5/21 TF started, hypoxia with turning  5/22 Return to OR for exploration, attempted closure > unable 5/24 Trach 5/26 OR for debridement and dressing change 5/29 Exploratory laparotomy, placement of ABBRA closure system 5/31 - No events overnight. Plans to return to OR for dressing change  6/11- tolerating SBT/ off sedation; Vfib arrest that evening   SUBJECTIVE/OVERNIGHT/INTERVAL HX VF arrest overnight.  CTA chest showed LLE PE for which he was started on heparin. CT head negative (had left sided weakness).  This AM, pt awake mouthing words appropriately.  Still has some weakness on LUE and LLE.  Vitals stable, not on pressors this  AM.  VITAL SIGNS: BP (!) 113/51   Pulse (!) 114   Temp 98.6 F (37 C) (Axillary)   Resp 15   Ht 5\' 7"  (1.702 m)   Wt 114.2 kg (251 lb 12.3 oz)   SpO2 97%   BMI 39.43 kg/m   HEMODYNAMICS: CVP:  [8 mmHg-22 mmHg] 12 mmHg  VENTILATOR SETTINGS: Vent Mode: PRVC FiO2 (%):  [40 %-100 %] 40 % Set Rate:  [20 bmp-24 bmp] 24 bmp Vt Set:  [530 mL] 530 mL PEEP:  [5 cmH20] 5 cmH20 Pressure Support:  [10 cmH20] 10 cmH20 Plateau Pressure:  [12 cmH20-18 cmH20] 18 cmH20  INTAKE / OUTPUT: I/O last 3 completed shifts: In: 4016.6 [I.V.:1074.1; NG/GT:2182.5; IV Piggyback:760] Out: 66 [Urine:2285; Drains:675; Stool:1750]  Physical exam  General:  Chronically ill appearing male, in NAD HEENT: MM pink/moist, trach midline, EOMI Neuro: awake, tracks, mouths words, moving R side spont, wiggling left toes but no other left sided movement noted CV: IRIR, diminished pulses  PULM: Trach in place.  BS slightly diminished bases but otherwise clear GI: distended, wound vac in place- no significant output, R ostomy- with high output, uro-bag in place- clear urine  Extremities: cool /dry, generalized edema, LUE edematous  Skin: no rashes    PULMONARY Recent Labs  Lab 09/28/17 0359 09/30/17 2157 09/30/17 2250 10/01/17 0459  PHART 7.465* 7.181* 7.419 7.458*  PCO2ART 27.6* 44.1 32.2 28.1*  PO2ART 102 82.0* 355* 127.0*  HCO3 19.5* 16.3* 20.2 19.8*  TCO2  --  18*  --  21*  O2SAT 97.6  92.0 99.6 99.0    CBC Recent Labs  Lab 09/30/17 1134 09/30/17 2220 10/01/17 0449  HGB 7.9* 9.1* 8.2*  HCT 26.4* 30.8* 26.7*  WBC 7.7 7.8 15.6*  PLT 273 323 297    COAGULATION No results for input(s): INR in the last 168 hours.  CARDIAC   Recent Labs  Lab 09/30/17 2221 10/01/17 0449  TROPONINI 0.06* 1.36*   No results for input(s): PROBNP in the last 168 hours.   CHEMISTRY Recent Labs  Lab 09/28/17 0359 09/29/17 0331 09/30/17 1134 09/30/17 2220 10/01/17 0028 10/01/17 0449  NA 146* 146*  149* 149*  --  150*  K 3.8 3.8 3.5 3.9  --  3.3*  CL 117* 117* 120* 116*  --  119*  CO2 21* 20* 22 17*  --  22  GLUCOSE 132* 136* 122* 159*  --  117*  BUN 41* 42* 40* 37*  --  39*  CREATININE 1.30* 1.36* 1.36* 1.53*  --  1.57*  CALCIUM 7.6* 7.6* 7.9* 7.9*  --  7.6*  MG  --  2.3 2.5*  --  2.3 2.1  PHOS  --   --  3.2  --   --   --    Estimated Creatinine Clearance: 55.1 mL/min (A) (by C-G formula based on SCr of 1.57 mg/dL (H)).   LIVER Recent Labs  Lab 09/27/17 0339 09/28/17 0359 09/30/17 1134 09/30/17 2220 10/01/17 0449  AST 64* 56* 28 54* 61*  ALT 41 38 24 30 37  ALKPHOS 117 107 81 117 109  BILITOT 0.6 0.5 0.5 0.7 0.9  PROT 5.2* 5.3* 5.9* 5.7* 5.4*  ALBUMIN 1.3* 1.3* 2.4* 2.8* 2.6*     INFECTIOUS Recent Labs  Lab 09/26/17 1133  09/28/17 0359 09/30/17 2214 09/30/17 2221 10/01/17 0220 10/01/17 0449  LATICACIDVEN 1.4  --   --  6.0*  --  1.2  --   PROCALCITON 0.59   < > 0.31  --  0.23  --  1.47   < > = values in this interval not displayed.     ENDOCRINE CBG (last 3)  Recent Labs    09/30/17 1952 10/01/17 0000 10/01/17 0438  GLUCAP 76 134* 108*     IMAGING x48h  - image(s) personally visualized  -   highlighted in bold Ct Head Wo Contrast  Result Date: 10/01/2017 CLINICAL DATA:  Altered mental status.  Left hemiplegia. EXAM: CT HEAD WITHOUT CONTRAST TECHNIQUE: Contiguous axial images were obtained from the base of the skull through the vertex without intravenous contrast. COMPARISON:  In head CT 06/21/2013 FINDINGS: Brain: There is no mass, hemorrhage or extra-axial collection. There is generalized atrophy without lobar predilection. There is no acute or chronic infarction. There is hypoattenuation of the periventricular white matter, most commonly indicating chronic ischemic microangiopathy. Vascular: No abnormal hyperdensity of the major intracranial arteries or dural venous sinuses. No intracranial atherosclerosis. Skull: The visualized skull base,  calvarium and extracranial soft tissues are normal. Sinuses/Orbits: Fluid level in the right maxillary sinus and complete opacification of both mastoids and middle ears. The orbits are normal. IMPRESSION: 1. Chronic microvascular ischemia and generalized atrophy without acute intracranial abnormality. 2. Right maxillary sinus fluid level and bilateral mastoid and middle ear effusions. Electronically Signed   By: Ulyses Jarred M.D.   On: 10/01/2017 01:14   Ct Angio Chest Pe W Or Wo Contrast  Addendum Date: 10/01/2017   ADDENDUM REPORT: 10/01/2017 01:57 ADDENDUM: These results were called by telephone at the time of interpretation  on 10/01/2017 at 1:57 am to NP [facility] , who verbally acknowledged these results. Electronically Signed   By: Ashley Royalty M.D.   On: 10/01/2017 01:57   Result Date: 10/01/2017 CLINICAL DATA:  Cardiac arrest post CPR moving right-side only and not following commands. EXAM: CT ANGIOGRAPHY CHEST WITH CONTRAST TECHNIQUE: Multidetector CT imaging of the chest was performed using the standard protocol during bolus administration of intravenous contrast. Multiplanar CT image reconstructions and MIPs were obtained to evaluate the vascular anatomy. CONTRAST:  45mL ISOVUE-370 IOPAMIDOL (ISOVUE-370) INJECTION 76% COMPARISON:  Same day CXR FINDINGS: Cardiovascular: Conventional branch pattern of the great vessels with atherosclerosis at the origins. No significant stenosis. Moderate aortic atherosclerosis of the nonaneurysmal aorta. Satisfactory opacification of the pulmonary arteries with left lower lobe pulmonary emboli noted within the lobar and segmental branches. Right heart strain noted with RV/LV ratio of approximately 0.89. No pericardial effusion. Left sided approach PICC line is seen in the distal SVC. Mediastinum/Nodes: Tracheostomy tube tip is seen within the upper third of the trachea terminating at the level of the aortic arch. Gastric tube extends into the stomach and  terminates in the duodenal bulb. No adenopathy. Lungs/Pleura: Small left and small to moderate right pleural effusions with adjacent atelectasis. No pneumothorax. Upper Abdomen: Rectus diastasis of the muscles with interposed intra-abdominal fat. Reportedly the patient has undergone exploratory laparotomy with incisional hernia repair and mesh insertion which this may represent a portion of. Biliary sludge noted within the gallbladder. Atrophic left kidney with marked renal cortical thinning and compensatory hypertrophy of the visualized right kidney. Normal adrenal glands and spleen. Musculoskeletal: No chest wall abnormality. No acute or significant osseous findings. Review of the MIP images confirms the above findings. IMPRESSION: 1. Acute nonocclusive pulmonary emboli to the left lower lobe. CT evidence of right heart strain (RV/LV Ratio = 0.89) consistent with at least submassive (intermediate risk) PE. The presence of right heart strain has been associated with an increased risk of morbidity and mortality. Please activate Code PE by paging [phone]. 2. Cardiomegaly with bilateral small pleural effusions right greater than left. Adjacent compressive atelectasis is identified. No pneumothorax. 3. Support lines and tubes as above in satisfactory position. 4. Atrophic left kidney with compensatory hypertrophy of the right kidney. 5. Partially included ventral mesh repair of the abdomen. Aortic Atherosclerosis (ICD10-I70.0). Electronically Signed: By: Ashley Royalty M.D. On: 10/01/2017 01:41   Dg Chest Port 1 View  Result Date: 09/30/2017 CLINICAL DATA:  Cardiac arrest EXAM: PORTABLE CHEST 1 VIEW COMPARISON:  Chest radiograph 09/27/2017 FINDINGS: Unchanged cardiomegaly. Left-sided PICC line tip is at the cavoatrial junction. Ventilation tube tip is just below the level of the clavicular heads, well above the carina. Gastric tube courses below the diaphragm. No focal airspace consolidation. There is pulmonary  venous apical redistribution with probable mild interstitial edema. No pleural effusion or pneumothorax. IMPRESSION: 1. Cardiomegaly and suspected early interstitial pulmonary edema. 2. Unchanged support apparatus. Electronically Signed   By: Ulyses Jarred M.D.   On: 09/30/2017 22:31   DISCUSSION: 67 y/o male with an SMA occlusion requiring emergent bypass and eventually R hemicolectomy and ilestomy is gone back to the operating room several times for washouts as well as working on abdominal closure.  Most recently had mesh placed.  He is been on IV sedation for a prolonged period of time and is at a very high risk for prolonged encephalopathy.  Progressive day weaning on MV and off sedation 6/11.  Subsequent Vfib arrest 6/11  ASSESSMENT / PLAN:   Ischemic bowel, s/p resection with hemicolectomy and ileostomy; now status post several washouts.  Severe protein calorie malnutrition Plan - TF/Wound care per surgery  Acute respiratory failure with hypoxemia with acute pulmonary edema  Status post tracheostomy 5/24 LLE PE H/O COPD Plan: - Continuing full vent support with daily SBT trials - Attempt SBT / PS this AM, if tolerates then will try ATC - Continue heparin gtt - f/u TTE and LE dopplers - trend CXR and ABG  Vfib arrest 6/11 - unclear etiology at this point- concern for embolic event, given hx, no significant electrolytes abnormalities on labs, EKG non-acute but new Afib New onset Afib 6/11 Shock -6/11 - resolved History of coronary artery disease, prior MI, hypertension and peripheral vascular disease Plan - Follow electrolytes closely - F/u on TTE - Consult cards  Acute kidney injury Hypernatremia/hyperchloremia.  Hypocalcemia: pseudo Hypokalemia - s/p repletion Plan: - continue free water  - Add D5W - Follow BMP  Acute encephalopathy w/ hypoactive delirium Left sided weakness- ? More pronounced after cardiac arrest overnight 6/11.  CT head negative. Plan - limit  sedation as able - RASS goal -1 - fentanyl gtt and prn versed - Daily WUA - Seroquel nightly, monitor QTc  Anemia of critical illness - No evidence of active bleeding currently Plan Trend CBC Transfuse per protocol for hemoglobin less than 7  Mild leukocytosis, and temperature - resolved  Plan Trend CBC and fever curve Surgery with plans to change wound vac at bedside 6/12  Hyperglycemia Plan Continue sliding scale insulin  DVT prophylaxis: heparin gtt SUP: H2B IV for now Diet: TF Activity: bedrest  Disposition : ICU  FAMILY  - Updates: Sister, Bethena Roys called and updated on events overnight 6/11.  She was shocked in that he had such a good, progressive day and is still processing information.  Will need to continue ongoing goals of care discussions, given this setback in his prolonged, complicated hospitalization.   CC time: 30 min.   Montey Hora, [place] [facility] Pager: [phone]  or [phone] 10/01/2017, 8:05 AM

## 2017-10-01 NOTE — Progress Notes (Signed)
[place] Progress Note Patient Name: Luke Kaufman DOB: [birth year] MRN: [id number]   Date of Service  10/01/2017  HPI/Events of Note  K+ = 3.3 and Creatinine = 1.57.  eICU Interventions  Will replace K+.      Intervention Category Major Interventions: Electrolyte abnormality - evaluation and management  Lysle Dingwall 10/01/2017, 6:37 AM

## 2017-10-01 NOTE — Progress Notes (Signed)
ANTICOAGULATION CONSULT NOTE   Pharmacy Consult for Heparin Indication: pulmonary embolus  No Known Allergies  Patient Measurements: Height: 5\' 7"  (170.2 cm) Weight: 251 lb 12.3 oz (114.2 kg) IBW/kg (Calculated) : 66.1 Heparin Dosing Weight: 90 kg  Vital Signs: Temp: 98.4 F (36.9 C) (06/12 1224) Temp Source: Oral (06/12 1224) BP: 137/55 (06/12 0846) Pulse Rate: 103 (06/12 0846)  Labs: Recent Labs    09/30/17 1134 09/30/17 2220 09/30/17 2221 10/01/17 0449 10/01/17 0947 10/01/17 1159  HGB 7.9* 9.1*  --  8.2*  --   --   HCT 26.4* 30.8*  --  26.7*  --   --   PLT 273 323  --  297  --   --   HEPARINUNFRC  --   --   --   --  1.38* 0.18*  CREATININE 1.36* 1.53*  --  1.57*  --   --   TROPONINI  --   --  0.06* 1.36* 1.74*  --     Estimated Creatinine Clearance: 55.1 mL/min (A) (by C-G formula based on SCr of 1.57 mg/dL (H)).   Medical History: Past Medical History:  Diagnosis Date  . Gastric ulcer   . Hyperlipemia   . Hypertension   . MI (myocardial infarction) ([place])   . Stroke ([place])   . Tobacco abuse     Medications:  Medications Prior to Admission  Medication Sig Dispense Refill Last Dose  . amLODipine (NORVASC) 10 MG tablet Take 1 tablet (10 mg total) by mouth daily. 30 tablet 0 09/03/2017 at Unknown time  . aspirin EC 81 MG tablet Take 81 mg by mouth every 6 (six) hours as needed for moderate pain.   09/02/2017 at Unknown time  . atorvastatin (LIPITOR) 40 MG tablet Take 40 mg by mouth daily.  1 09/03/2017 at Unknown time  . gemfibrozil (LOPID) 600 MG tablet Take 600 mg by mouth 2 (two) times daily before a meal.   1 09/03/2017 at Unknown time  . metoprolol tartrate (LOPRESSOR) 100 MG tablet Take 100 mg by mouth 2 (two) times daily.  1 09/03/2017 at 0900  . triamterene-hydrochlorothiazide (MAXZIDE-25) 37.5-25 MG tablet TAKE 1 TABLET BY MOUTH DAILY   09/03/2017 at Unknown time    Assessment: 67 y.o. male admitted 5/16 s/p multiple abdominal surgeries and  tracheostomy, now s/p VFib arrest t and found to have PE and DVT on heparin -Initial heparin level was 1.38 and the repeat level was 0.18 (collected from the a-line). Anticipate that the a-line collection is the more reliable sample   Goal of Therapy:  Heparin level 0.3-0.7 units/ml Monitor platelets by anticoagulation protocol: Yes   Plan:  -Heparin 2500 units IV bolus, then increase to 1550 units/hr -Heparin level in 6 hours and daily wth CBC daily  Hildred Laser, PharmD Clinical Pharmacist Clinical phone from 8:30-4:00 is [phone] After 4pm, please call Main Rx [phone]) for assistance. 10/01/2017 1:16 PM

## 2017-10-01 NOTE — Progress Notes (Signed)
ANTICOAGULATION CONSULT NOTE   Pharmacy Consult for Heparin Indication: pulmonary embolus and bilateral DVTs  No Known Allergies  Patient Measurements: Height: 5\' 7"  (170.2 cm) Weight: 251 lb 12.3 oz (114.2 kg) IBW/kg (Calculated) : 66.1 Heparin Dosing Weight: 90 kg  Vital Signs: Temp: 98.9 F (37.2 C) (06/12 1948) Temp Source: Oral (06/12 1948) BP: 134/55 (06/12 2028) Pulse Rate: 107 (06/12 2028)  Labs: Recent Labs    09/30/17 1134 09/30/17 2220 09/30/17 2221 10/01/17 0449 10/01/17 0947 10/01/17 1159 10/01/17 2008  HGB 7.9* 9.1*  --  8.2*  --   --   --   HCT 26.4* 30.8*  --  26.7*  --   --   --   PLT 273 323  --  297  --   --   --   HEPARINUNFRC  --   --   --   --  1.38* 0.18* 0.25*  CREATININE 1.36* 1.53*  --  1.57*  --   --   --   TROPONINI  --   --  0.06* 1.36* 1.74*  --   --     Estimated Creatinine Clearance: 55.1 mL/min (A) (by C-G formula based on SCr of 1.57 mg/dL (H)).   Medical History: Past Medical History:  Diagnosis Date  . Gastric ulcer   . Hyperlipemia   . Hypertension   . MI (myocardial infarction) ([place])   . Stroke ([place])   . Tobacco abuse     Medications:  Medications Prior to Admission  Medication Sig Dispense Refill Last Dose  . amLODipine (NORVASC) 10 MG tablet Take 1 tablet (10 mg total) by mouth daily. 30 tablet 0 09/03/2017 at Unknown time  . aspirin EC 81 MG tablet Take 81 mg by mouth every 6 (six) hours as needed for moderate pain.   09/02/2017 at Unknown time  . atorvastatin (LIPITOR) 40 MG tablet Take 40 mg by mouth daily.  1 09/03/2017 at Unknown time  . gemfibrozil (LOPID) 600 MG tablet Take 600 mg by mouth 2 (two) times daily before a meal.   1 09/03/2017 at Unknown time  . metoprolol tartrate (LOPRESSOR) 100 MG tablet Take 100 mg by mouth 2 (two) times daily.  1 09/03/2017 at 0900  . triamterene-hydrochlorothiazide (MAXZIDE-25) 37.5-25 MG tablet TAKE 1 TABLET BY MOUTH DAILY   09/03/2017 at Unknown time    Assessment: 67 y.o.  male admitted 5/16 s/p multiple abdominal surgeries and tracheostomy, now s/p VFib arrest and found to have PE and DVT on heparin  Heparin level subtherapeutic but trending up after rate increase.  No bleeding complications noted.  Goal of Therapy:  Heparin level 0.3-0.7 units/ml Monitor platelets by anticoagulation protocol: Yes   Plan:  Increase heparin to 1950 units/hr Next heparin level with AM labs Heparin level and CBC daily while on heparin  [employer], Pharm.D., BCPS Clinical Pharmacist 10/01/2017 9:15 PM

## 2017-10-01 NOTE — Progress Notes (Signed)
Call from Kennieth Rad, NP to stop SCDs and to stop taking cuff BP on patient's leg. Orders initiated. Will continue to monitor closely.

## 2017-10-01 NOTE — Progress Notes (Signed)
[place] Progress Note Patient Name: Luke Kaufman DOB: [birth year] MRN: [id number]   Date of Service  10/01/2017  HPI/Events of Note  No AM labs ordered.   eICU Interventions  Will order: 1. CBC with Platelets, CMP, Mg++ and ABG at 5 AM.      Intervention Category Major Interventions: Other:  Lysle Dingwall 10/01/2017, 4:47 AM

## 2017-10-01 NOTE — Progress Notes (Signed)
8 Days Post-Op   Subjective/Chief Complaint: Events of last night noted. Awake on vent.   Objective: Vital signs in last 24 hours: Temp:  [98.6 F (37 C)-99.7 F (37.6 C)] 98.6 F (37 C) (06/12 0700) Pulse Rate:  [65-114] 114 (06/12 0407) Resp:  [15-44] 15 (06/12 0600) BP: (82-179)/(43-86) 113/51 (06/12 0407) SpO2:  [88 %-100 %] 97 % (06/12 0600) Arterial Line BP: (115-139)/(43-53) 139/52 (06/12 0600) FiO2 (%):  [40 %-100 %] 40 % (06/12 0407) Weight:  [114.2 kg (251 lb 12.3 oz)] 114.2 kg (251 lb 12.3 oz) (06/12 0600) Last BM Date: 09/30/17  Intake/Output from previous day: 06/11 0701 - 06/12 0700 In: 2565.8 [I.V.:683.3; NG/GT:1272.5; IV Piggyback:610] Out: 2705 [Urine:1480; Drains:475; Stool:750] Intake/Output this shift: No intake/output data recorded.  General appearance: alert Neck: trach in place - no air leak Resp: few rhonchi Cardio: regular rate and rhythm GI: VAC on wound, ileostomy pink  Lab Results:  Recent Labs    09/30/17 2220 10/01/17 0449  WBC 7.8 15.6*  HGB 9.1* 8.2*  HCT 30.8* 26.7*  PLT 323 297   BMET Recent Labs    09/30/17 2220 10/01/17 0449  NA 149* 150*  K 3.9 3.3*  CL 116* 119*  CO2 17* 22  GLUCOSE 159* 117*  BUN 37* 39*  CREATININE 1.53* 1.57*  CALCIUM 7.9* 7.6*   PT/INR No results for input(s): LABPROT, INR in the last 72 hours. ABG Recent Labs    09/30/17 2250 10/01/17 0459  PHART 7.419 7.458*  HCO3 20.2 19.8*    Studies/Results: Ct Head Wo Contrast  Result Date: 10/01/2017 CLINICAL DATA:  Altered mental status.  Left hemiplegia. EXAM: CT HEAD WITHOUT CONTRAST TECHNIQUE: Contiguous axial images were obtained from the base of the skull through the vertex without intravenous contrast. COMPARISON:  In head CT 06/21/2013 FINDINGS: Brain: There is no mass, hemorrhage or extra-axial collection. There is generalized atrophy without lobar predilection. There is no acute or chronic infarction. There is hypoattenuation of the  periventricular white matter, most commonly indicating chronic ischemic microangiopathy. Vascular: No abnormal hyperdensity of the major intracranial arteries or dural venous sinuses. No intracranial atherosclerosis. Skull: The visualized skull base, calvarium and extracranial soft tissues are normal. Sinuses/Orbits: Fluid level in the right maxillary sinus and complete opacification of both mastoids and middle ears. The orbits are normal. IMPRESSION: 1. Chronic microvascular ischemia and generalized atrophy without acute intracranial abnormality. 2. Right maxillary sinus fluid level and bilateral mastoid and middle ear effusions. Electronically Signed   By: Ulyses Kaufman M.D.   On: 10/01/2017 01:14   Ct Angio Chest Pe W Or Wo Contrast  Addendum Date: 10/01/2017   ADDENDUM REPORT: 10/01/2017 01:57 ADDENDUM: These results were called by telephone at the time of interpretation on 10/01/2017 at 1:57 am to NP PAULA SIMPSON , who verbally acknowledged these results. Electronically Signed   By: Ashley Royalty M.D.   On: 10/01/2017 01:57   Result Date: 10/01/2017 CLINICAL DATA:  Cardiac arrest post CPR moving right-side only and not following commands. EXAM: CT ANGIOGRAPHY CHEST WITH CONTRAST TECHNIQUE: Multidetector CT imaging of the chest was performed using the standard protocol during bolus administration of intravenous contrast. Multiplanar CT image reconstructions and MIPs were obtained to evaluate the vascular anatomy. CONTRAST:  20mL ISOVUE-370 IOPAMIDOL (ISOVUE-370) INJECTION 76% COMPARISON:  Same day CXR FINDINGS: Cardiovascular: Conventional branch pattern of the great vessels with atherosclerosis at the origins. No significant stenosis. Moderate aortic atherosclerosis of the nonaneurysmal aorta. Satisfactory opacification of the pulmonary arteries  with left lower lobe pulmonary emboli noted within the lobar and segmental branches. Right heart strain noted with RV/LV ratio of approximately 0.89. No pericardial  effusion. Left sided approach PICC line is seen in the distal SVC. Mediastinum/Nodes: Tracheostomy tube tip is seen within the upper third of the trachea terminating at the level of the aortic arch. Gastric tube extends into the stomach and terminates in the duodenal bulb. No adenopathy. Lungs/Pleura: Small left and small to moderate right pleural effusions with adjacent atelectasis. No pneumothorax. Upper Abdomen: Rectus diastasis of the muscles with interposed intra-abdominal fat. Reportedly the patient has undergone exploratory laparotomy with incisional hernia repair and mesh insertion which this may represent a portion of. Biliary sludge noted within the gallbladder. Atrophic left kidney with marked renal cortical thinning and compensatory hypertrophy of the visualized right kidney. Normal adrenal glands and spleen. Musculoskeletal: No chest wall abnormality. No acute or significant osseous findings. Review of the MIP images confirms the above findings. IMPRESSION: 1. Acute nonocclusive pulmonary emboli to the left lower lobe. CT evidence of right heart strain (RV/LV Ratio = 0.89) consistent with at least submassive (intermediate risk) PE. The presence of right heart strain has been associated with an increased risk of morbidity and mortality. Please activate Code PE by paging [phone]. 2. Cardiomegaly with bilateral small pleural effusions right greater than left. Adjacent compressive atelectasis is identified. No pneumothorax. 3. Support lines and tubes as above in satisfactory position. 4. Atrophic left kidney with compensatory hypertrophy of the right kidney. 5. Partially included ventral mesh repair of the abdomen. Aortic Atherosclerosis (ICD10-I70.0). Electronically Signed: By: Ashley Royalty M.D. On: 10/01/2017 01:41   Dg Chest Port 1 View  Result Date: 09/30/2017 CLINICAL DATA:  Cardiac arrest EXAM: PORTABLE CHEST 1 VIEW COMPARISON:  Chest radiograph 09/27/2017 FINDINGS: Unchanged cardiomegaly.  Left-sided PICC line tip is at the cavoatrial junction. Ventilation tube tip is just below the level of the clavicular heads, well above the carina. Gastric tube courses below the diaphragm. No focal airspace consolidation. There is pulmonary venous apical redistribution with probable mild interstitial edema. No pleural effusion or pneumothorax. IMPRESSION: 1. Cardiomegaly and suspected early interstitial pulmonary edema. 2. Unchanged support apparatus. Electronically Signed   By: Ulyses Kaufman M.D.   On: 09/30/2017 22:31    Anti-infectives: Anti-infectives (From admission, onward)   Start     Dose/Rate Route Frequency Ordered Stop   09/19/17 0730  ceFAZolin (ANCEF) IVPB 2g/100 mL premix     2 g 200 mL/hr over 30 Minutes Intravenous On call to O.R. 09/19/17 0715 09/19/17 1057   09/17/17 1145  ceFAZolin (ANCEF) 3 g in dextrose 5 % 50 mL IVPB     3 g 100 mL/hr over 30 Minutes Intravenous To ShortStay Surgical 09/17/17 0923 09/17/17 1449   09/07/17 1545  cefTRIAXone (ROCEPHIN) 2 g in sodium chloride 0.9 % 100 mL IVPB     2 g 200 mL/hr over 30 Minutes Intravenous Every 24 hours 09/07/17 1543 09/11/17 1605   09/04/17 1400  piperacillin-tazobactam (ZOSYN) IVPB 3.375 g  Status:  Discontinued     3.375 g 12.5 mL/hr over 240 Minutes Intravenous Every 8 hours 09/04/17 1157 09/07/17 1541   09/04/17 1100  ceFAZolin (ANCEF) IVPB 2g/100 mL premix  Status:  Discontinued     2 g 200 mL/hr over 30 Minutes Intravenous Every 8 hours 09/04/17 1035 09/04/17 1157   09/04/17 0630  piperacillin-tazobactam (ZOSYN) IVPB 3.375 g  Status:  Discontinued     3.375 g 100 mL/hr  over 30 Minutes Intravenous  Once 09/04/17 0617 09/04/17 0626   09/04/17 0619  piperacillin-tazobactam (ZOSYN) IVPB 3.375 g     3.375 g 100 mL/hr over 30 Minutes Intravenous 60 min pre-op 09/04/17 0620 09/04/17 0717   09/04/17 0030  piperacillin-tazobactam (ZOSYN) IVPB 3.375 g     3.375 g 100 mL/hr over 30 Minutes Intravenous  Once 09/04/17 0024  09/04/17 0144      Assessment/Plan: Mesenteric ischemia 1.Exploratory laparotomy right hemicolectomy 09/04/2017 Dr. Stark Klein 2.Abdominal exploration, application of temporary abdominal closure device(ABTHERA), 09/04/2017 Dr. Nadeen Landau 3.Oratory laparotomy, ileostomy, application of open abdominal wound VAC, 09/06/2017 Dr. Georganna Skeans 4. Open abdomen for attempted closure, 09/08/2017 Dr. Judeth Horn 5. Oratory laparotomy with possible wound closure(prior sutures upper&lower portion of the fascia had broken), 09/10/2017 Dr. Judeth Horn 6. Reexploration open abdominal wound, application ofABTHERAWOUND VAC,09/14/2017 Dr. Greer Pickerel 7. Exploratory laparotomy and partial closure of abdomen using the elastic tension closure device (ABRA), Dr. Hulen Skains, 05/29 8. exploratory laparotomy and dressing change, Dr. Ninfa Linden, 05/31 9. Changed negative pressure dressing, Dr. Kieth Brightly, 06/03 10.reopening of recent laparotomy, incisional hernia repair, insertion of mesh, placement of negative pressure dressing, Dr. Kieth Brightly, 06/04 11. Bedside vac change, Dr. Kieth Brightly, 06/07 12. Bedside VAC change Dr. Grandville Silos 06/10 KGU:RKYH feeds, Immodium Acute PE - heparin drip ID:[id number] up some Foley:yes Follow up:TBD  DISPO:vac changetoday at bedside  LOS: 27 days    Luke Kaufman 10/01/2017

## 2017-10-02 DIAGNOSIS — E7800 Pure hypercholesterolemia, unspecified: Secondary | ICD-10-CM

## 2017-10-02 DIAGNOSIS — J9622 Acute and chronic respiratory failure with hypercapnia: Secondary | ICD-10-CM

## 2017-10-02 DIAGNOSIS — I469 Cardiac arrest, cause unspecified: Secondary | ICD-10-CM

## 2017-10-02 DIAGNOSIS — J9621 Acute and chronic respiratory failure with hypoxia: Secondary | ICD-10-CM

## 2017-10-02 LAB — GLUCOSE, CAPILLARY
GLUCOSE-CAPILLARY: 101 mg/dL — AB (ref 65–99)
GLUCOSE-CAPILLARY: 113 mg/dL — AB (ref 65–99)
GLUCOSE-CAPILLARY: 122 mg/dL — AB (ref 65–99)
GLUCOSE-CAPILLARY: 125 mg/dL — AB (ref 65–99)
GLUCOSE-CAPILLARY: 134 mg/dL — AB (ref 65–99)
Glucose-Capillary: 123 mg/dL — ABNORMAL HIGH (ref 65–99)

## 2017-10-02 LAB — MAGNESIUM: MAGNESIUM: 2.3 mg/dL (ref 1.7–2.4)

## 2017-10-02 LAB — CBC
HCT: 25.9 % — ABNORMAL LOW (ref 39.0–52.0)
HEMOGLOBIN: 7.9 g/dL — AB (ref 13.0–17.0)
MCH: 27.9 pg (ref 26.0–34.0)
MCHC: 30.5 g/dL (ref 30.0–36.0)
MCV: 91.5 fL (ref 78.0–100.0)
Platelets: 273 10*3/uL (ref 150–400)
RBC: 2.83 MIL/uL — AB (ref 4.22–5.81)
RDW: 15.4 % (ref 11.5–15.5)
WBC: 8.9 10*3/uL (ref 4.0–10.5)

## 2017-10-02 LAB — BASIC METABOLIC PANEL
Anion gap: 10 (ref 5–15)
BUN: 33 mg/dL — ABNORMAL HIGH (ref 6–20)
CHLORIDE: 118 mmol/L — AB (ref 101–111)
CO2: 22 mmol/L (ref 22–32)
CREATININE: 1.39 mg/dL — AB (ref 0.61–1.24)
Calcium: 7.5 mg/dL — ABNORMAL LOW (ref 8.9–10.3)
GFR calc non Af Amer: 51 mL/min — ABNORMAL LOW (ref >60)
GFR, EST AFRICAN AMERICAN: 59 mL/min — AB (ref >60)
Glucose, Bld: 134 mg/dL — ABNORMAL HIGH (ref 65–99)
Potassium: 3.4 mmol/L — ABNORMAL LOW (ref 3.5–5.1)
SODIUM: 150 mmol/L — AB (ref 135–145)

## 2017-10-02 LAB — HEPARIN LEVEL (UNFRACTIONATED)
HEPARIN UNFRACTIONATED: 0.25 [IU]/mL — AB (ref 0.30–0.70)
HEPARIN UNFRACTIONATED: 1.8 [IU]/mL — AB (ref 0.30–0.70)
Heparin Unfractionated: <0.1 IU/mL — ABNORMAL LOW (ref 0.30–0.70)

## 2017-10-02 LAB — PHOSPHORUS: PHOSPHORUS: 2.8 mg/dL (ref 2.5–4.6)

## 2017-10-02 LAB — PROCALCITONIN: PROCALCITONIN: 1.8 ng/mL

## 2017-10-02 MED ORDER — OSMOLITE 1.2 CAL PO LIQD
1000.0000 mL | ORAL | Status: DC
  Administered 2017-10-03 – 2017-10-06 (×4): 1000 mL
  Filled 2017-10-02 (×20): qty 1000

## 2017-10-02 MED ORDER — PRO-STAT SUGAR FREE PO LIQD
60.0000 mL | Freq: Two times a day (BID) | ORAL | Status: DC
  Administered 2017-10-03 – 2017-10-08 (×11): 60 mL
  Filled 2017-10-02 (×11): qty 60

## 2017-10-02 MED ORDER — METOPROLOL TARTRATE 5 MG/5ML IV SOLN
2.5000 mg | Freq: Four times a day (QID) | INTRAVENOUS | Status: DC
  Administered 2017-10-02 – 2017-10-03 (×4): 2.5 mg via INTRAVENOUS
  Filled 2017-10-02 (×5): qty 5

## 2017-10-02 MED ORDER — POTASSIUM CHLORIDE 20 MEQ/15ML (10%) PO SOLN
40.0000 meq | Freq: Once | ORAL | Status: AC
  Administered 2017-10-02: 40 meq
  Filled 2017-10-02: qty 30

## 2017-10-02 NOTE — Progress Notes (Addendum)
Nutrition Follow-up  DOCUMENTATION CODES:   Obesity unspecified  INTERVENTION:  Osmolite 1.2@65ml /hr + 60 ml Prostat BID via NG. Total regimenprovides: 2272 kcal, 146 grams protein, and 1279 ml free water; 103% of kcal and 117% protein needs.  NUTRITION DIAGNOSIS:   Increased nutrient needs related to wound healing as evidenced by estimated needs.  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 Pt s/p ex lap with R hemicolectomy 5/16.   6/11 off sedation, Vfib arrest; new onset HF 6/12 off vent TF stopped Pt now on trach collar.  Per surgery PA, pt tolerating tube feeds, output thickened by imodium.   Pt weight stable since admission with fluctuations suspected due to fluid changes.  Dietetic intern noted that the pt removed the NG tube and notified the RN; NG tube back in place. Pt awake on TC, pt did not respond to dietetic intern.   Medications reviewed: PS TID, FW 300 Q6H, novolog 0-15 units, imodium, seroquel, pepcid, heparin, Vital HP @55  ml/hr, heparin.   Labs reviewed: Na 150 (H), K+ 3.4 (L), Cl 118 (H), BG 134 (H), BUN 33 (H), creatinine 1.39 (H), GFR 51 (L), RBC 2.83 (L), hemoglobin 7.9 (L), HCT 25.9 (L).   Net I/O since admit: + 13  Diet Order:   Diet Order           Diet NPO time specified  Diet effective now          EDUCATION NEEDS:   No education needs have been identified at this time  Skin:  Skin Assessment: Skin Integrity Issues: Skin Integrity Issues:: Wound VAC Wound Vac: abd  Last BM:  6/12 - ileostomy 1L  Height:   Ht Readings from Last 1 Encounters:  09/06/17 5\' 7"  (1.702 m)    Weight:    Wt Readings from Last 1 Encounters:  10/02/17 250 lb 14.1 oz (113.8 kg)    Ideal Body Weight:  67.2 kg  BMI:  Body mass index is 39.29 kg/m.  Estimated Nutritional Needs:   Kcal:  2000-2200 kcal  Protein:  110-125 g  Fluid:  > 2 L/day    Hope Budds, Dietetic Intern

## 2017-10-02 NOTE — Consult Note (Addendum)
Cardiology Consultation:   Patient ID: Luke Kaufman; [id number]; March 21, 1951   Admit date: 09/03/2017 Date of Consult: 10/02/2017  Primary Care Provider: Jefm Petty, MD Primary Cardiologist: Dr. Cristobal Goldmann at [facility] Primary Electrophysiologist:    Patient Profile:   Luke Kaufman is a 67 y.o. male with a hx of CAD/MI (?MI 2015 without stents at [facility]), PVD, CVA, HTN, HLD, tobacco use, PUD with GI bleeding who is being seen today for the evaluation of systolic heart failure following Vfib arrest and Afib at the request of Dr. Nelda Marseille.  History of Present Illness:   Luke Kaufman presented to the [facility] on 09/03/17 via EMS with abdominal complaints. CT concerning for portal venous gas, elevated lactate. Vascular surgery completed a mesenteric angio to assess gut blood supply. This showed ischemic right colon. Exploratory lap by general surgery with right hemicolectomy and ileostomy. Pt remains under the care of surgery and with an open belly (has undergone several ex lap with failed attempts to close). He underwent tracheostomy on 09/12/17.  On 09/30/17, he suffered a Vfib arrest with 11 minutes of CPR, shock x 2, amiodarone x 1, and epinephrine x 4 that resolved arrest. He was resuscitated, rhythm was sinus tachycardia.  He also had new onset atrial fibrillation following arrest. CTA with PE in left lower lobe. He also has several DVTs in upper and lower extremities. Heparin drip started 10/01/17.   Echocardiogram following arrest showed newly reduced LVEF of 40-45% and severe hypokinesis of the ateroseptal and anterior myocardium consistent with ischemia or LAD infarction. Troponins were trended: 0.06 --> 1.36 --> 1.74. EKG with questionable Afib; repeat EKG with sinus tachycardia.  Cardiology was consulted. On my interview, pt with trach in place. Communication with the patient is difficult. No family at bedside.  He didn't answer when asked about previous stents in his heart. He nodded "yes" when  I asked about chest pain during this admission.   No records available in Epic concerning prior MI. [facility] was consulted in 2016 for mild troponin elevation. In that note, pt had previously followed with [facility] cardiology Cristobal Goldmann). Pt states he has not seen Dr. Falk/cardiology in many years. No ischemic workup by [place] in 2016. Echo in 2016 with normal function.   Past Medical History:  Diagnosis Date  . Gastric ulcer   . Hyperlipemia   . Hypertension   . MI (myocardial infarction) ([place])   . Stroke ([place])   . Tobacco abuse     Past Surgical History:  Procedure Laterality Date  . APPLICATION OF WOUND VAC N/A 09/04/2017   Procedure: APPLICATION OF WOUND VAC and Exploration of Abdomen.;  Surgeon: Elam Dutch, MD;  Location: [place];  Service: Vascular;  Laterality: N/A;  . APPLICATION OF WOUND VAC N/A 09/06/2017   Procedure: APPLICATION OF WOUND VAC;  Surgeon: Georganna Skeans, MD;  Location: [place];  Service: General;  Laterality: N/A;  . APPLICATION OF WOUND VAC N/A 09/08/2017   Procedure: APPLICATION OF WOUND VAC;  Surgeon: Judeth Horn, MD;  Location: [place];  Service: General;  Laterality: N/A;  . APPLICATION OF WOUND VAC N/A 09/12/2017   Procedure: APPLICATION OF WOUND VAC  (CHANGE);  Surgeon: Judeth Horn, MD;  Location: [place];  Service: General;  Laterality: N/A;  . APPLICATION OF WOUND VAC N/A 09/14/2017   Procedure: APPLICATION OF WOUND VAC;  Surgeon: Greer Pickerel, MD;  Location: [place];  Service: General;  Laterality: N/A;  . APPLICATION OF WOUND VAC N/A 09/23/2017   Procedure: APPLICATION OF NEGATIVE  PRESSURE THERAPY;  Surgeon: Kieth Brightly, Arta Bruce, MD;  Location: [place];  Service: General;  Laterality: N/A;  . BYPASS GRAFT AORTA TO AORTA  09/04/2017   Procedure: Aorta to Superior Mesinteric aorta bypass ultrason Left common Femoral/;  Surgeon: Elam Dutch, MD;  Location: [facility] OR;  Service: Vascular;;  . ESOPHAGOGASTRODUODENOSCOPY N/A 08/09/2014   Procedure:  ESOPHAGOGASTRODUODENOSCOPY (EGD);  Surgeon: Jerene Bears, MD;  Location: [facility];  Service: Gastroenterology;  Laterality: N/A;  . ESOPHAGOGASTRODUODENOSCOPY N/A 08/10/2014   Procedure: ESOPHAGOGASTRODUODENOSCOPY (EGD);  Surgeon: Jerene Bears, MD;  Location: [facility];  Service: Gastroenterology;  Laterality: N/A;  . ILEOSTOMY Right 09/06/2017   Procedure: POSSIBLE ILEOSTOMY;  Surgeon: Georganna Skeans, MD;  Location: [place];  Service: General;  Laterality: Right;  . INSERTION OF MESH N/A 09/23/2017   Procedure: INSERTION OF MESH;  Surgeon: Kinsinger, Arta Bruce, MD;  Location: [place];  Service: General;  Laterality: N/A;  . LAPAROTOMY Right 09/04/2017   Procedure: EXPLORATORY LAPAROTOMY Right Colon Resection;  Surgeon: Elam Dutch, MD;  Location: [facility];  Service: Vascular;  Laterality: Right;  . LAPAROTOMY N/A 09/06/2017   Procedure: EXPLORATORY LAPAROTOMY;  Surgeon: Georganna Skeans, MD;  Location: [place];  Service: General;  Laterality: N/A;  . LAPAROTOMY N/A 09/08/2017   Procedure: EXPLORATORY LAPAROTOMY, ABDOMINAL WASHOUT, PARTIAL CLOSURE OF ABDOMEN;  Surgeon: Judeth Horn, MD;  Location: [place];  Service: General;  Laterality: N/A;  . LAPAROTOMY N/A 09/10/2017   Procedure: EXPLORATORY LAPAROTOMY WITH POSSIBLE WOUND CLOSURE ABDOMEN;  Surgeon: Judeth Horn, MD;  Location: [place];  Service: General;  Laterality: N/A;  . LAPAROTOMY N/A 09/12/2017   Procedure: EXPLORATORY LAPAROTOMY;  Surgeon: Judeth Horn, MD;  Location: [place];  Service: General;  Laterality: N/A;  . LAPAROTOMY N/A 09/14/2017   Procedure: EXPLORATORY LAPAROTOMY;  Surgeon: Greer Pickerel, MD;  Location: [place];  Service: General;  Laterality: N/A;  . LAPAROTOMY N/A 09/17/2017   Procedure: EXPLORATORY LAPAROTOMY AND PARTIAL CLOSURE OF ABDOMEN;  Surgeon: Judeth Horn, MD;  Location: [place];  Service: General;  Laterality: N/A;  . LAPAROTOMY N/A 09/19/2017   Procedure: EXPLORATORY LAPAROTOMY & DRESSING CHANGE;  Surgeon: Coralie Keens,  MD;  Location: [place];  Service: General;  Laterality: N/A;  . LAPAROTOMY N/A 09/23/2017   Procedure: EXPLORATORY LAPAROTOMY, INCISIONAL HERNIA REPAIR WITH MESH INSERTION, PARTIAL CLOSURE OF SKIN;  Surgeon: Kieth Brightly, Arta Bruce, MD;  Location: [place];  Service: General;  Laterality: N/A;  . TRACHEOSTOMY TUBE PLACEMENT N/A 09/12/2017   Procedure: TRACHEOSTOMY;  Surgeon: Judeth Horn, MD;  Location: [place];  Service: General;  Laterality: N/A;  . VISCERAL ANGIOGRAM N/A 09/04/2017   Procedure: Non Selective MESENTERIC ANGIOGRAM,;  Surgeon: Elam Dutch, MD;  Location: [facility] OR;  Service: Vascular;  Laterality: N/A;     Home Medications:  Prior to Admission medications   Medication Sig Start Date End Date Taking? Authorizing Provider  amLODipine (NORVASC) 10 MG tablet Take 1 tablet (10 mg total) by mouth daily. 05/19/13  Yes Thurnell Lose, MD  aspirin EC 81 MG tablet Take 81 mg by mouth every 6 (six) hours as needed for moderate pain.   Yes [provider]  atorvastatin (LIPITOR) 40 MG tablet Take 40 mg by mouth daily. 06/23/17  Yes [provider]  gemfibrozil (LOPID) 600 MG tablet Take 600 mg by mouth 2 (two) times daily before a meal.  06/20/17  Yes [provider]  metoprolol tartrate (LOPRESSOR) 100 MG tablet Take 100 mg by mouth  2 (two) times daily. 07/04/17  Yes [provider]  triamterene-hydrochlorothiazide (MAXZIDE-25) 37.5-25 MG tablet TAKE 1 TABLET BY MOUTH DAILY 05/15/16  Yes [provider]    Inpatient Medications: Scheduled Meds: . chlorhexidine gluconate (MEDLINE KIT)  15 mL Mouth Rinse BID  . Chlorhexidine Gluconate Cloth  6 each Topical Daily  . feeding supplement (PRO-STAT SUGAR FREE 64)  30 mL Per Tube TID  . free water  300 mL Per Tube Q6H  . HYDROmorphone  1 mg Oral Q4H  . insulin aspart  0-15 Units Subcutaneous Q4H  . lidocaine-EPINEPHrine  20 mL Intradermal Once  . loperamide HCl  4 mg Oral BID  . mouth rinse  15 mL Mouth Rinse  10 times per day  . QUEtiapine  50 mg Oral QHS  . sodium chloride flush  10-40 mL Intracatheter Q12H   Continuous Infusions: . sodium chloride 10 mL/hr at 10/01/17 1900  . sodium chloride    . famotidine (PEPCID) IV Stopped (10/02/17 3716)  . feeding supplement (VITAL HIGH PROTEIN) 55 mL/hr at 10/01/17 1900  . heparin 2,200 Units/hr (10/02/17 0636)   PRN Meds: acetaminophen **OR** acetaminophen, fentaNYL, HYDROmorphone, ipratropium-albuterol, midazolam, pneumococcal 23 valent vaccine, sodium chloride flush  Allergies:   No Known Allergies  Social History:   Social History   Socioeconomic History  . Marital status: Single    Spouse name: Not on file  . Number of children: 1  . Years of education: Not on file  . Highest education level: Not on file  Occupational History  . Not on file  Social Needs  . Financial resource strain: Not on file  . Food insecurity:    Worry: Not on file    Inability: Not on file  . Transportation needs:    Medical: Not on file    Non-medical: Not on file  Tobacco Use  . Smoking status: Current Every Day Smoker    Packs/day: 1.50    Types: Cigarettes  . Smokeless tobacco: Never Used  . Tobacco comment: He was taking Chantix but had nightmares.  Substance and Sexual Activity  . Alcohol use: Yes    Alcohol/week: 0.0 oz    Comment: occasional  . Drug use: No  . Sexual activity: Not on file  Lifestyle  . Physical activity:    Days per week: Not on file    Minutes per session: Not on file  . Stress: Not on file  Relationships  . Social connections:    Talks on phone: Not on file    Gets together: Not on file    Attends religious service: Not on file    Active member of club or organization: Not on file    Attends meetings of clubs or organizations: Not on file    Relationship status: Not on file  . Intimate partner violence:    Fear of current or ex partner: Not on file    Emotionally abused: Not on file    Physically abused: Not on  file    Forced sexual activity: Not on file  Other Topics Concern  . Not on file  Social History Narrative   Retired from [employer] se[employer].      Family History:    Family History  Problem Relation Age of Onset  . Pancreatic cancer Mother   . Heart attack Father 18     ROS:  Please see the history of present illness.   All other ROS reviewed and negative.  Physical Exam/Data:   Vitals:   10/02/17 1000 10/02/17 1100 10/02/17 1207 10/02/17 1208  BP:    (!) 140/56  Pulse:    (!) 110  Resp: 20 (!) 25  (!) 24  Temp:   (!) 96.3 F (35.7 C)   TempSrc:   Axillary   SpO2: 91% 97%  96%  Weight:      Height:        Intake/Output Summary (Last 24 hours) at 10/02/2017 1402 Last data filed at 10/02/2017 0848 Gross per 24 hour  Intake 2248.42 ml  Output 2000 ml  Net 248.42 ml   Filed Weights   09/30/17 0600 10/01/17 0600 10/02/17 0530  Weight: 244 lb 7.8 oz (110.9 kg) 251 lb 12.3 oz (114.2 kg) 250 lb 14.1 oz (113.8 kg)   Body mass index is 39.29 kg/m.  General:  Trach collar in place, communication is difficult HEENT: normal Vascular: No carotid bruits, exam difficult Cardiac:  Tachycardic rate, exam difficult with breath sounds/trach Lungs:  Trach in place  Abd: wound vac in place  Ext: + B LE edema Musculoskeletal:  No deformities, BUE and BLE strength normal and equal Skin: warm and dry  Psych:  Normal affect   EKG:  The EKG was personally reviewed and demonstrates: Afib on 6/11, repeat EKG today with sinus tachycardia Telemetry:  Telemetry was personally reviewed and demonstrates:  Sinus tach to Afib, labile rate  Relevant CV Studies:  Echo 10/01/17: Study Conclusions - Left ventricle: Systolic function was mildly to moderately   reduced. The estimated ejection fraction was in the range of 40%   to 45%. Severe hypokinesis of the anteroseptal and anterior   myocardium; consistent with ischemia or infarction in the   distribution of the left  anterior descending coronary artery.   However, the apex appears to be spared. Due to tachycardia, there   was fusion of early and atrial contributions to ventricular   filling. The study is not technically sufficient to allow   evaluation of LV diastolic function. - Mitral valve: Calcified annulus.  Impressions: - Very technically challenging study. Suggest an alternative   imaging technique such as CT or MRI.  Laboratory Data:  Chemistry Recent Labs  Lab 09/30/17 2220 10/01/17 0449 10/02/17 0515  NA 149* 150* 150*  K 3.9 3.3* 3.4*  CL 116* 119* 118*  CO2 17* 22 22  GLUCOSE 159* 117* 134*  BUN 37* 39* 33*  CREATININE 1.53* 1.57* 1.39*  CALCIUM 7.9* 7.6* 7.5*  GFRNONAA 45* 44* 51*  GFRAA 53* 51* 59*  ANIONGAP 16* 9 10    Recent Labs  Lab 09/30/17 1134 09/30/17 2220 10/01/17 0449  PROT 5.9* 5.7* 5.4*  ALBUMIN 2.4* 2.8* 2.6*  AST 28 54* 61*  ALT 24 30 37  ALKPHOS 81 117 109  BILITOT 0.5 0.7 0.9   Hematology Recent Labs  Lab 09/30/17 2220 10/01/17 0449 10/02/17 0515  WBC 7.8 15.6* 8.9  RBC 3.24* 2.89* 2.83*  HGB 9.1* 8.2* 7.9*  HCT 30.8* 26.7* 25.9*  MCV 95.1 92.4 91.5  MCH 28.1 28.4 27.9  MCHC 29.5* 30.7 30.5  RDW 15.3 15.3 15.4  PLT 323 297 273   Cardiac Enzymes Recent Labs  Lab 09/30/17 2221 10/01/17 0449 10/01/17 0947  TROPONINI 0.06* 1.36* 1.74*   No results for input(s): TROPIPOC in the last 168 hours.  BNPNo results for input(s): BNP, PROBNP in the last 168 hours.  DDimer No results for input(s): DDIMER in the last 168 hours.  Radiology/Studies:  Ct Head Wo Contrast  Result Date: 10/01/2017 CLINICAL DATA:  Altered mental status.  Left hemiplegia. EXAM: CT HEAD WITHOUT CONTRAST TECHNIQUE: Contiguous axial images were obtained from the base of the skull through the vertex without intravenous contrast. COMPARISON:  In head CT 06/21/2013 FINDINGS: Brain: There is no mass, hemorrhage or extra-axial collection. There is generalized atrophy  without lobar predilection. There is no acute or chronic infarction. There is hypoattenuation of the periventricular white matter, most commonly indicating chronic ischemic microangiopathy. Vascular: No abnormal hyperdensity of the major intracranial arteries or dural venous sinuses. No intracranial atherosclerosis. Skull: The visualized skull base, calvarium and extracranial soft tissues are normal. Sinuses/Orbits: Fluid level in the right maxillary sinus and complete opacification of both mastoids and middle ears. The orbits are normal. IMPRESSION: 1. Chronic microvascular ischemia and generalized atrophy without acute intracranial abnormality. 2. Right maxillary sinus fluid level and bilateral mastoid and middle ear effusions. Electronically Signed   By: Ulyses Jarred M.D.   On: 10/01/2017 01:14   Ct Angio Chest Pe W Or Wo Contrast  Addendum Date: 10/01/2017   ADDENDUM REPORT: 10/01/2017 01:57 ADDENDUM: These results were called by telephone at the time of interpretation on 10/01/2017 at 1:57 am to NP PAULA SIMPSON , who verbally acknowledged these results. Electronically Signed   By: Ashley Royalty M.D.   On: 10/01/2017 01:57   Result Date: 10/01/2017 CLINICAL DATA:  Cardiac arrest post CPR moving right-side only and not following commands. EXAM: CT ANGIOGRAPHY CHEST WITH CONTRAST TECHNIQUE: Multidetector CT imaging of the chest was performed using the standard protocol during bolus administration of intravenous contrast. Multiplanar CT image reconstructions and MIPs were obtained to evaluate the vascular anatomy. CONTRAST:  8m ISOVUE-370 IOPAMIDOL (ISOVUE-370) INJECTION 76% COMPARISON:  Same day CXR FINDINGS: Cardiovascular: Conventional branch pattern of the great vessels with atherosclerosis at the origins. No significant stenosis. Moderate aortic atherosclerosis of the nonaneurysmal aorta. Satisfactory opacification of the pulmonary arteries with left lower lobe pulmonary emboli noted within the lobar  and segmental branches. Right heart strain noted with RV/LV ratio of approximately 0.89. No pericardial effusion. Left sided approach PICC line is seen in the distal SVC. Mediastinum/Nodes: Tracheostomy tube tip is seen within the upper third of the trachea terminating at the level of the aortic arch. Gastric tube extends into the stomach and terminates in the duodenal bulb. No adenopathy. Lungs/Pleura: Small left and small to moderate right pleural effusions with adjacent atelectasis. No pneumothorax. Upper Abdomen: Rectus diastasis of the muscles with interposed intra-abdominal fat. Reportedly the patient has undergone exploratory laparotomy with incisional hernia repair and mesh insertion which this may represent a portion of. Biliary sludge noted within the gallbladder. Atrophic left kidney with marked renal cortical thinning and compensatory hypertrophy of the visualized right kidney. Normal adrenal glands and spleen. Musculoskeletal: No chest wall abnormality. No acute or significant osseous findings. Review of the MIP images confirms the above findings. IMPRESSION: 1. Acute nonocclusive pulmonary emboli to the left lower lobe. CT evidence of right heart strain (RV/LV Ratio = 0.89) consistent with at least submassive (intermediate risk) PE. The presence of right heart strain has been associated with an increased risk of morbidity and mortality. Please activate Code PE by paging [phone] 2. Cardiomegaly with bilateral small pleural effusions right greater than left. Adjacent compressive atelectasis is identified. No pneumothorax. 3. Support lines and tubes as above in satisfactory position. 4. Atrophic left kidney with compensatory hypertrophy of the right kidney. 5. Partially included ventral mesh repair of  the abdomen. Aortic Atherosclerosis (ICD10-I70.0). Electronically Signed: By: Ashley Royalty M.D. On: 10/01/2017 01:41   Dg Chest Port 1 View  Result Date: 09/30/2017 CLINICAL DATA:  Cardiac arrest  EXAM: PORTABLE CHEST 1 VIEW COMPARISON:  Chest radiograph 09/27/2017 FINDINGS: Unchanged cardiomegaly. Left-sided PICC line tip is at the cavoatrial junction. Ventilation tube tip is just below the level of the clavicular heads, well above the carina. Gastric tube courses below the diaphragm. No focal airspace consolidation. There is pulmonary venous apical redistribution with probable mild interstitial edema. No pleural effusion or pneumothorax. IMPRESSION: 1. Cardiomegaly and suspected early interstitial pulmonary edema. 2. Unchanged support apparatus. Electronically Signed   By: Ulyses Jarred M.D.   On: 09/30/2017 22:31    Assessment and Plan:   1. New onset systolic heart failure post Vfib arrest - echo with LVEF of 40-45%, Severe hypokinesis of the anteroseptal and anterior myocardium; consistent with ischemia or infarction in the  distribution of the left anterior descending coronary artery. - on heparin drip for PE/DVTs and Afib - Hb 7.9 (8.2) - will repeat echo with definity   2. NSTEMI, CAD, ?MI 2015, no stents placed per patient in previous records - On ASA and statin at home  - no records available in Epic concerning prior MI. [facility] was consulted in 2016 for mild troponin elevation. In that note, pt had previously followed with [facility], but no longer follows with cardiology. Questionable cardiac history.  - pt will need an ischemic workup, but will defer at this time given his ongoing problems. He will need to recover from his abdominal surgeries and be able to tolerate DAPT, if needed. He will be on anticoagulation for PE/DVTs and bouts of Afib.  - question etiology of NSTEMI: CAD vs embolic event vs PEs   3. New onset Afib vs multifocal atrial tachycardia, not rate controlled, appears to be generally atrial tachycardia tachycardia - telemetry with sinus tachycardia and bouts of atrial fibrillation, rates variable in the 120-150s - EKG today with sinus tachycardia - may use  scheduled IV lopressor (2.5 mg q 6 hr) for rate control as pressure tolerates; will avoid diltiazem in the setting of heart failure - This patients CHA2DS2-VASc Score and unadjusted Ischemic Stroke Rate (% per year) is equal to 9.7 % stroke rate/year from a score of 6 (vascular, CHF, HTN, age, stroke) - agree with anticoagulation    4. HTN - home meds include norvasc and lopressor - on hold - pressures labile   5. HLD - on statin at home - no recent lipid profile - defer statin while NPO   For questions or updates, please contact [place] Please consult [email or web address] for contact info under Cardiology/STEMI.   Signed, [place], PA  10/02/2017 2:02 PM

## 2017-10-02 NOTE — Progress Notes (Addendum)
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 Fascia remains open, agitation barrier to weaning. Trached 5/24., to return to OR 5/26, 5/29 now with wound vac   STUDIES:  CT ab/pelvis 5/15 > portal venous gas in liver, sigmoid colon with diverticular disease, atrophic left kidney CT head 6/12 > chronic ischemia, right maxillary sinus fluid level and bilateral mastoid and middle ear effusions. CTA chest 6/12 > LLL PE (RV / LV 0.89). Echo 6/12 > EF 40-45%, ? Ischemia or infarction in distribution of LAD given severe hypokinesis. UE duplex 6/12 > DVT in right brachial vein, SVT in left basilic and cephalic veins. LE dupled 6/12 > DVT in left CFV and FV.  Negative on right.  CULTURES: 5/17 resp > e coli resistant to multiple antibiotics 5/17 blood > negative  ANTIBIOTICS: 5/15 zosyn > 5/19  5/19 ceftriaxone > 5/23   LINES/TUBES: 5/16 R Cowan CVL > 5/24 5/16 ETT > 5/24 5/24 Trach > 6/10 prox XLT placed >>> 5/18 R femoral arterial line > 5/24 5/24 LUA PICC   SIGNIFICANT EVENTS: 5/16 mesenteric angiogram and aorto to SMA bypass 5/16 ex lap R hemicolectomy 5/18 ileostomy 5/19 abdominal washout and partial fascial closure 5/21 TF started, hypoxia with turning  5/22 Return to OR for exploration, attempted closure > unable 5/24 Trach 5/26 OR for debridement and dressing change 5/29 Exploratory laparotomy, placement of ABBRA closure system 5/31 - No events overnight. Plans to return to OR for dressing change  6/11- tolerating SBT/ off sedation; Vfib arrest that evening 6/12 VAC changed  SUBJECTIVE/OVERNIGHT/INTERVAL  HX:  Vitals stable. On ATC, just dropped down to 28% FiO2 and sats remain at 99%.   VITAL SIGNS: BP (!) 104/39   Pulse (!) 114   Temp 98.5 F (36.9 C) (Oral)   Resp (!) 25   Ht 5\' 7"  (1.702 m)   Wt 113.8 kg (250 lb 14.1 oz)   SpO2 99%   BMI 39.29 kg/m   HEMODYNAMICS: CVP:  [2 mmHg-13 mmHg] 3 mmHg  VENTILATOR SETTINGS: Vent Mode: PRVC FiO2 (%):  [40 %] 40 % Set Rate:  [24 bmp] 24 bmp Vt Set:  [530 mL] 530 mL PEEP:  [5 cmH20] 5 cmH20 Pressure Support:  [8 cmH20] 8 cmH20 Plateau Pressure:  [14 cmH20-16 cmH20] 15 cmH20  INTAKE / OUTPUT: I/O last 3 completed shifts: In: 4452.6 [I.V.:1345.1; ZJ/IR:6789.3; IV Piggyback:460] Out: 8101 [BPZWC:5852; Drains:750; Stool:1050]  Physical exam  General:  Chronically ill appearing male, in NAD HEENT: MM pink/moist, trach midline, EOMI Neuro: awake, tracks, mouths words, moving R side spont, wiggling left toes but no other left sided movement noted CV: IRIR, no M/R/G PULM: Trach in place.  BS slightly diminished bases but otherwise clear GI: distended, wound vac in place, right ostomy, urostomy Extremities: cool /dry, generalized edema Skin: no rashes    PULMONARY Recent Labs  Lab 09/28/17 0359 09/30/17 2157 09/30/17 2250 10/01/17 0459  PHART 7.465* 7.181* 7.419 7.458*  PCO2ART 27.6* 44.1 32.2 28.1*  PO2ART 102 82.0* 355* 127.0*  HCO3 19.5* 16.3* 20.2 19.8*  TCO2  --  18*  --  21*  O2SAT  97.6 92.0 99.6 99.0    CBC Recent Labs  Lab 09/30/17 2220 10/01/17 0449 10/02/17 0515  HGB 9.1* 8.2* 7.9*  HCT 30.8* 26.7* 25.9*  WBC 7.8 15.6* 8.9  PLT 323 297 273    COAGULATION No results for input(s): INR in the last 168 hours.  CARDIAC   Recent Labs  Lab 09/30/17 2221 10/01/17 0449 10/01/17 0947  TROPONINI 0.06* 1.36* 1.74*   No results for input(s): PROBNP in the last 168 hours.   CHEMISTRY Recent Labs  Lab 09/29/17 0331 09/30/17 1134 09/30/17 2220 10/01/17 0028 10/01/17 0449 10/02/17 0515  NA 146*  149* 149*  --  150* 150*  K 3.8 3.5 3.9  --  3.3* 3.4*  CL 117* 120* 116*  --  119* 118*  CO2 20* 22 17*  --  22 22  GLUCOSE 136* 122* 159*  --  117* 134*  BUN 42* 40* 37*  --  39* 33*  CREATININE 1.36* 1.36* 1.53*  --  1.57* 1.39*  CALCIUM 7.6* 7.9* 7.9*  --  7.6* 7.5*  MG 2.3 2.5*  --  2.3 2.1 2.3  PHOS  --  3.2  --   --   --  2.8   Estimated Creatinine Clearance: 62.1 mL/min (A) (by C-G formula based on SCr of 1.39 mg/dL (H)).   LIVER Recent Labs  Lab 09/27/17 0339 09/28/17 0359 09/30/17 1134 09/30/17 2220 10/01/17 0449  AST 64* 56* 28 54* 61*  ALT 41 38 24 30 37  ALKPHOS 117 107 81 117 109  BILITOT 0.6 0.5 0.5 0.7 0.9  PROT 5.2* 5.3* 5.9* 5.7* 5.4*  ALBUMIN 1.3* 1.3* 2.4* 2.8* 2.6*     INFECTIOUS Recent Labs  Lab 09/26/17 1133  09/30/17 2214 09/30/17 2221 10/01/17 0220 10/01/17 0449 10/02/17 0515  LATICACIDVEN 1.4  --  6.0*  --  1.2  --   --   PROCALCITON 0.59   < >  --  0.23  --  1.47 1.80   < > = values in this interval not displayed.     ENDOCRINE CBG (last 3)  Recent Labs    10/02/17 0008 10/02/17 0401 10/02/17 0736  GLUCAP 122* 123* 125*     IMAGING x48h  - image(s) personally visualized  -   highlighted in bold Ct Head Wo Contrast  Result Date: 10/01/2017 CLINICAL DATA:  Altered mental status.  Left hemiplegia. EXAM: CT HEAD WITHOUT CONTRAST TECHNIQUE: Contiguous axial images were obtained from the base of the skull through the vertex without intravenous contrast. COMPARISON:  In head CT 06/21/2013 FINDINGS: Brain: There is no mass, hemorrhage or extra-axial collection. There is generalized atrophy without lobar predilection. There is no acute or chronic infarction. There is hypoattenuation of the periventricular white matter, most commonly indicating chronic ischemic microangiopathy. Vascular: No abnormal hyperdensity of the major intracranial arteries or dural venous sinuses. No intracranial atherosclerosis. Skull: The visualized skull base,  calvarium and extracranial soft tissues are normal. Sinuses/Orbits: Fluid level in the right maxillary sinus and complete opacification of both mastoids and middle ears. The orbits are normal. IMPRESSION: 1. Chronic microvascular ischemia and generalized atrophy without acute intracranial abnormality. 2. Right maxillary sinus fluid level and bilateral mastoid and middle ear effusions. Electronically Signed   By: Ulyses Jarred M.D.   On: 10/01/2017 01:14   Ct Angio Chest Pe W Or Wo Contrast  Addendum Date: 10/01/2017   ADDENDUM REPORT: 10/01/2017 01:57 ADDENDUM: These results were called by telephone at the time of interpretation  on 10/01/2017 at 1:57 am to NP [facility] , who verbally acknowledged these results. Electronically Signed   By: Ashley Royalty M.D.   On: 10/01/2017 01:57   Result Date: 10/01/2017 CLINICAL DATA:  Cardiac arrest post CPR moving right-side only and not following commands. EXAM: CT ANGIOGRAPHY CHEST WITH CONTRAST TECHNIQUE: Multidetector CT imaging of the chest was performed using the standard protocol during bolus administration of intravenous contrast. Multiplanar CT image reconstructions and MIPs were obtained to evaluate the vascular anatomy. CONTRAST:  73mL ISOVUE-370 IOPAMIDOL (ISOVUE-370) INJECTION 76% COMPARISON:  Same day CXR FINDINGS: Cardiovascular: Conventional branch pattern of the great vessels with atherosclerosis at the origins. No significant stenosis. Moderate aortic atherosclerosis of the nonaneurysmal aorta. Satisfactory opacification of the pulmonary arteries with left lower lobe pulmonary emboli noted within the lobar and segmental branches. Right heart strain noted with RV/LV ratio of approximately 0.89. No pericardial effusion. Left sided approach PICC line is seen in the distal SVC. Mediastinum/Nodes: Tracheostomy tube tip is seen within the upper third of the trachea terminating at the level of the aortic arch. Gastric tube extends into the stomach and  terminates in the duodenal bulb. No adenopathy. Lungs/Pleura: Small left and small to moderate right pleural effusions with adjacent atelectasis. No pneumothorax. Upper Abdomen: Rectus diastasis of the muscles with interposed intra-abdominal fat. Reportedly the patient has undergone exploratory laparotomy with incisional hernia repair and mesh insertion which this may represent a portion of. Biliary sludge noted within the gallbladder. Atrophic left kidney with marked renal cortical thinning and compensatory hypertrophy of the visualized right kidney. Normal adrenal glands and spleen. Musculoskeletal: No chest wall abnormality. No acute or significant osseous findings. Review of the MIP images confirms the above findings. IMPRESSION: 1. Acute nonocclusive pulmonary emboli to the left lower lobe. CT evidence of right heart strain (RV/LV Ratio = 0.89) consistent with at least submassive (intermediate risk) PE. The presence of right heart strain has been associated with an increased risk of morbidity and mortality. Please activate Code PE by paging [phone]. 2. Cardiomegaly with bilateral small pleural effusions right greater than left. Adjacent compressive atelectasis is identified. No pneumothorax. 3. Support lines and tubes as above in satisfactory position. 4. Atrophic left kidney with compensatory hypertrophy of the right kidney. 5. Partially included ventral mesh repair of the abdomen. Aortic Atherosclerosis (ICD10-I70.0). Electronically Signed: By: Ashley Royalty M.D. On: 10/01/2017 01:41   Dg Chest Port 1 View  Result Date: 09/30/2017 CLINICAL DATA:  Cardiac arrest EXAM: PORTABLE CHEST 1 VIEW COMPARISON:  Chest radiograph 09/27/2017 FINDINGS: Unchanged cardiomegaly. Left-sided PICC line tip is at the cavoatrial junction. Ventilation tube tip is just below the level of the clavicular heads, well above the carina. Gastric tube courses below the diaphragm. No focal airspace consolidation. There is pulmonary  venous apical redistribution with probable mild interstitial edema. No pleural effusion or pneumothorax. IMPRESSION: 1. Cardiomegaly and suspected early interstitial pulmonary edema. 2. Unchanged support apparatus. Electronically Signed   By: Ulyses Jarred M.D.   On: 09/30/2017 22:31   DISCUSSION: 67 y/o male with an SMA occlusion requiring emergent bypass and eventually R hemicolectomy and ilestomy is gone back to the operating room several times for washouts as well as working on abdominal closure.  Most recently had mesh placed.  He is been on IV sedation for a prolonged period of time and is at a very high risk for prolonged encephalopathy.  Progressive day weaning on MV and off sedation 6/11.  Subsequent Vfib arrest 6/11  ASSESSMENT / PLAN:   Ischemic bowel, s/p resection with hemicolectomy and ileostomy; now status post several washouts.  Severe protein calorie malnutrition Plan - TF/Wound care per surgery  Acute respiratory failure with hypoxemia with acute pulmonary edema  Status post tracheostomy 5/24 - transitioned down to ATC on 6/12 and tolerating well LLE PE H/O COPD Plan: - Continue ATC as able, wean FiO2 to room air (currently at 28%) - Continue heparin gtt  - Follow CXR intermittently  Vfib arrest 6/11 - unclear etiology at this point- concern for embolic event, given hx, no significant electrolytes abnormalities on labs, EKG non-acute but new Afib ? New ischemia / infarct - echo with severe hypokinesis and significantly reduced EF (40-45%)  New onset Afib 6/11 Shock -6/11 - resolved History of coronary artery disease, prior MI, hypertension and peripheral vascular disease Plan - Follow electrolytes closely - Consult cards - Monitor  Acute kidney injury Hypernatremia/hyperchloremia.  Hypocalcemia: pseudo Hypokalemia - s/p repletion Plan: - continue free water  - 40 mEq K per tube - Follow BMP  Acute encephalopathy w/ hypoactive delirium Left sided weakness- ?  More pronounced after cardiac arrest overnight 6/11.  CT head negative. Plan - minimize sedation as able - Fentanyl PRN / midazolam PRN - RASS goal 0 - Seroquel nightly, monitor QTc  Anemia of critical illness - No evidence of active bleeding currently Plan Trend CBC Transfuse per protocol for hemoglobin less than 7  Mild leukocytosis, and temperature - resolved  Plan Trend CBC and fever curve  Hyperglycemia Plan Continue sliding scale insulin  Vein Thromboses - DVT in RUE brachial vein, SVT in LUE basilic and cephalic veins, DVT in LLE common femoral and femoral veins, negative RLE. Plan Continue heparin and likely transition to warfarin when OK with surgery.   DVT prophylaxis: heparin gtt SUP: H2B IV for now Diet: TF Activity: bedrest  Disposition : ICU  FAMILY  - Updates: Sister, Bethena Roys called and updated on events overnight 6/11.  She was shocked in that he had such a good, progressive day and is still processing information.  Will need to continue ongoing goals of care discussions, given this setback in his prolonged, complicated hospitalization.   CC time: 30 min.   Montey Hora, [facility] Pager: [phone]  or [phone] 10/02/2017, 8:20 AM

## 2017-10-02 NOTE — Progress Notes (Signed)
[facility]/Trauma Progress Note  9 Days Post-Op   Assessment/Plan Principal Problem:   Mesenteric ischemia (HCC) Active Problems:   Essential hypertension   Arterial atherosclerosis   HLD (hyperlipidemia)   Abdominal pain   PUD (peptic ulcer disease)   Occlusion of superior mesenteric artery (HCC)   Respiratory failure (HCC)   Acute respiratory failure with hypoxemia (HCC)   AKI (acute kidney injury) ([place])  Mesenteric ischemia 1.Exploratory laparotomy right hemicolectomy 09/04/2017 Dr. Stark Klein 2.Abdominal exploration, application of temporary abdominal closure device(ABTHERA), 09/04/2017 Dr. Nadeen Landau 3.Oratory laparotomy, ileostomy, application of open abdominal wound VAC, 09/06/2017 Dr. Georganna Skeans 4. Open abdomen for attempted closure, 09/08/2017 Dr. Judeth Horn 5. Oratory laparotomy with possible wound closure(prior sutures upper&lower portion of the fascia had broken), 09/10/2017 Dr. Judeth Horn 6. Reexploration open abdominal wound, application ofABTHERAWOUND VAC,09/14/2017 Dr. Greer Pickerel 7. Exploratory laparotomy and partial closure of abdomen using the elastic tension closure device (ABRA), Dr. Hulen Skains, 05/29 8. exploratory laparotomy and dressing change, Dr. Ninfa Linden, 05/31 9. Changed negative pressure dressing, Dr. Kieth Brightly, 06/03 10.reopening of recent laparotomy, incisional hernia repair, insertion of mesh, placement of negative pressure dressing, Dr. Kieth Brightly, 06/04 11. Bedside vac change, Dr. Kieth Brightly, 06/07  - no fevers and WBC normal - extubated on trach collar - PE with brief cardiac arrest now on heparin - tolerating TF's, stool output down with immodium  QQI:WLNL feeds VTE: SCD's, heparin GX:QJJH currently Foley:yes Follow up:TBD  DISPO:vac changetomorrowat bedside by surgery team.    LOS: 28 days    Subjective: CC: open abdomen  No acute events overnight. Awake on trach collar. Follows some  commands  Objective: Vital signs in last 24 hours: Temp:  [98.4 F (36.9 C)-99 F (37.2 C)] 98.5 F (36.9 C) (06/13 0733) Pulse Rate:  [103-114] 114 (06/13 0735) Resp:  [14-25] 25 (06/13 0735) BP: (104-137)/(39-55) 104/39 (06/13 0735) SpO2:  [95 %-100 %] 99 % (06/13 0735) Arterial Line BP: (100-143)/(40-59) 127/49 (06/13 0630) FiO2 (%):  [40 %] 40 % (06/13 0735) Weight:  [113.8 kg (250 lb 14.1 oz)] 113.8 kg (250 lb 14.1 oz) (06/13 0530) Last BM Date: 10/01/17  Intake/Output from previous day: 06/12 0701 - 06/13 0700 In: 3201.4 [I.V.:876.4; ER/DE:0814; IV Piggyback:200] Out: 2450 [Urine:925; Drains:475; Stool:1050] Intake/Output this shift: No intake/output data recorded.  PE: Gen:On trach collar, awake Card:tachycardia Pulm:on trach collar GYJ:EHUDJ vac with tan output. Tansemisolid stool in ostomy bag Neuro: following some commands Skin: warm and dry   Anti-infectives: Anti-infectives (From admission, onward)   Start     Dose/Rate Route Frequency Ordered Stop   09/19/17 0730  ceFAZolin (ANCEF) IVPB 2g/100 mL premix     2 g 200 mL/hr over 30 Minutes Intravenous On call to O.R. 09/19/17 0715 09/19/17 1057   09/17/17 1145  ceFAZolin (ANCEF) 3 g in dextrose 5 % 50 mL IVPB     3 g 100 mL/hr over 30 Minutes Intravenous To [facility]Stay [facility] 09/17/17 0923 09/17/17 1449   09/07/17 1545  cefTRIAXone (ROCEPHIN) 2 g in sodium chloride 0.9 % 100 mL IVPB     2 g 200 mL/hr over 30 Minutes Intravenous Every 24 hours 09/07/17 1543 09/11/17 1605   09/04/17 1400  piperacillin-tazobactam (ZOSYN) IVPB 3.375 g  Status:  Discontinued     3.375 g 12.5 mL/hr over 240 Minutes Intravenous Every 8 hours 09/04/17 1157 09/07/17 1541   09/04/17 1100  ceFAZolin (ANCEF) IVPB 2g/100 mL premix  Status:  Discontinued     2 g 200 mL/hr over  30 Minutes Intravenous Every 8 hours 09/04/17 1035 09/04/17 1157   09/04/17 0630  piperacillin-tazobactam (ZOSYN) IVPB 3.375 g  Status:  Discontinued      3.375 g 100 mL/hr over 30 Minutes Intravenous  Once 09/04/17 0617 09/04/17 0626   09/04/17 0619  piperacillin-tazobactam (ZOSYN) IVPB 3.375 g     3.375 g 100 mL/hr over 30 Minutes Intravenous 60 min pre-op 09/04/17 0620 09/04/17 0717   09/04/17 0030  piperacillin-tazobactam (ZOSYN) IVPB 3.375 g     3.375 g 100 mL/hr over 30 Minutes Intravenous  Once 09/04/17 0024 09/04/17 0144      Lab Results:  Recent Labs    10/01/17 0449 10/02/17 0515  WBC 15.6* 8.9  HGB 8.2* 7.9*  HCT 26.7* 25.9*  PLT 297 273   BMET Recent Labs    10/01/17 0449 10/02/17 0515  NA 150* 150*  K 3.3* 3.4*  CL 119* 118*  CO2 22 22  GLUCOSE 117* 134*  BUN 39* 33*  CREATININE 1.57* 1.39*  CALCIUM 7.6* 7.5*   PT/INR No results for input(s): LABPROT, INR in the last 72 hours. CMP     Component Value Date/Time   NA 150 (H) 10/02/2017 0515   K 3.4 (L) 10/02/2017 0515   CL 118 (H) 10/02/2017 0515   CO2 22 10/02/2017 0515   GLUCOSE 134 (H) 10/02/2017 0515   BUN 33 (H) 10/02/2017 0515   CREATININE 1.39 (H) 10/02/2017 0515   CALCIUM 7.5 (L) 10/02/2017 0515   PROT 5.4 (L) 10/01/2017 0449   ALBUMIN 2.6 (L) 10/01/2017 0449   AST 61 (H) 10/01/2017 0449   ALT 37 10/01/2017 0449   ALKPHOS 109 10/01/2017 0449   BILITOT 0.9 10/01/2017 0449   GFRNONAA 51 (L) 10/02/2017 0515   GFRAA 59 (L) 10/02/2017 0515   Lipase     Component Value Date/Time   LIPASE 37 09/03/2017 1644    Studies/Results: Ct Head Wo Contrast  Result Date: 10/01/2017 CLINICAL DATA:  Altered mental status.  Left hemiplegia. EXAM: CT HEAD WITHOUT CONTRAST TECHNIQUE: Contiguous axial images were obtained from the base of the skull through the vertex without intravenous contrast. COMPARISON:  In head CT 06/21/2013 FINDINGS: Brain: There is no mass, hemorrhage or extra-axial collection. There is generalized atrophy without lobar predilection. There is no acute or chronic infarction. There is hypoattenuation of the periventricular white  matter, most commonly indicating chronic ischemic microangiopathy. Vascular: No abnormal hyperdensity of the major intracranial arteries or dural venous sinuses. No intracranial atherosclerosis. Skull: The visualized skull base, calvarium and extracranial soft tissues are normal. Sinuses/Orbits: Fluid level in the right maxillary sinus and complete opacification of both mastoids and middle ears. The orbits are normal. IMPRESSION: 1. Chronic microvascular ischemia and generalized atrophy without acute intracranial abnormality. 2. Right maxillary sinus fluid level and bilateral mastoid and middle ear effusions. Electronically Signed   By: Ulyses Jarred M.D.   On: 10/01/2017 01:14   Ct Angio Chest Pe W Or Wo Contrast  Addendum Date: 10/01/2017   ADDENDUM REPORT: 10/01/2017 01:57 ADDENDUM: These results were called by telephone at the time of interpretation on 10/01/2017 at 1:57 am to NP PAULA SIMPSON , who verbally acknowledged these results. Electronically Signed   By: Ashley Royalty M.D.   On: 10/01/2017 01:57   Result Date: 10/01/2017 CLINICAL DATA:  Cardiac arrest post CPR moving right-side only and not following commands. EXAM: CT ANGIOGRAPHY CHEST WITH CONTRAST TECHNIQUE: Multidetector CT imaging of the chest was performed using the standard protocol during  bolus administration of intravenous contrast. Multiplanar CT image reconstructions and MIPs were obtained to evaluate the vascular anatomy. CONTRAST:  33mL ISOVUE-370 IOPAMIDOL (ISOVUE-370) INJECTION 76% COMPARISON:  Same day CXR FINDINGS: Cardiovascular: Conventional branch pattern of the great vessels with atherosclerosis at the origins. No significant stenosis. Moderate aortic atherosclerosis of the nonaneurysmal aorta. Satisfactory opacification of the pulmonary arteries with left lower lobe pulmonary emboli noted within the lobar and segmental branches. Right heart strain noted with RV/LV ratio of approximately 0.89. No pericardial effusion. Left sided  approach PICC line is seen in the distal SVC. Mediastinum/Nodes: Tracheostomy tube tip is seen within the upper third of the trachea terminating at the level of the aortic arch. Gastric tube extends into the stomach and terminates in the duodenal bulb. No adenopathy. Lungs/Pleura: Small left and small to moderate right pleural effusions with adjacent atelectasis. No pneumothorax. Upper Abdomen: Rectus diastasis of the muscles with interposed intra-abdominal fat. Reportedly the patient has undergone exploratory laparotomy with incisional hernia repair and mesh insertion which this may represent a portion of. Biliary sludge noted within the gallbladder. Atrophic left kidney with marked renal cortical thinning and compensatory hypertrophy of the visualized right kidney. Normal adrenal glands and spleen. Musculoskeletal: No chest wall abnormality. No acute or significant osseous findings. Review of the MIP images confirms the above findings. IMPRESSION: 1. Acute nonocclusive pulmonary emboli to the left lower lobe. CT evidence of right heart strain (RV/LV Ratio = 0.89) consistent with at least submassive (intermediate risk) PE. The presence of right heart strain has been associated with an increased risk of morbidity and mortality. Please activate Code PE by paging [phone]. 2. Cardiomegaly with bilateral small pleural effusions right greater than left. Adjacent compressive atelectasis is identified. No pneumothorax. 3. Support lines and tubes as above in satisfactory position. 4. Atrophic left kidney with compensatory hypertrophy of the right kidney. 5. Partially included ventral mesh repair of the abdomen. Aortic Atherosclerosis (ICD10-I70.0). Electronically Signed: By: Ashley Royalty M.D. On: 10/01/2017 01:41   Dg Chest Port 1 View  Result Date: 09/30/2017 CLINICAL DATA:  Cardiac arrest EXAM: PORTABLE CHEST 1 VIEW COMPARISON:  Chest radiograph 09/27/2017 FINDINGS: Unchanged cardiomegaly. Left-sided PICC line tip  is at the cavoatrial junction. Ventilation tube tip is just below the level of the clavicular heads, well above the carina. Gastric tube courses below the diaphragm. No focal airspace consolidation. There is pulmonary venous apical redistribution with probable mild interstitial edema. No pleural effusion or pneumothorax. IMPRESSION: 1. Cardiomegaly and suspected early interstitial pulmonary edema. 2. Unchanged support apparatus. Electronically Signed   By: Ulyses Jarred M.D.   On: 09/30/2017 22:31      Kalman Drape , [facility] 10/02/2017, 8:05 AM  Pager: [phone] Mon-Wed, Friday 7:00am-4:30pm Thurs 7am-11:30am  Consults: [phone]

## 2017-10-02 NOTE — Progress Notes (Signed)
ANTICOAGULATION CONSULT NOTE Pharmacy Consult for Heparin Indication: pulmonary embolus  No Known Allergies  Patient Measurements: Height: 5\' 7"  (170.2 cm) Weight: 250 lb 14.1 oz (113.8 kg) IBW/kg (Calculated) : 66.1 Heparin Dosing Weight: 90 kg  Vital Signs: Temp: 99 F (37.2 C) (06/13 1540) Temp Source: Oral (06/13 1540) BP: 140/56 (06/13 1208) Pulse Rate: 110 (06/13 1928)  Labs: Recent Labs    09/30/17 2220 09/30/17 2221 10/01/17 0449 10/01/17 0947  10/02/17 0515 10/02/17 1400 10/02/17 1730  HGB 9.1*  --  8.2*  --   --  7.9*  --   --   HCT 30.8*  --  26.7*  --   --  25.9*  --   --   PLT 323  --  297  --   --  273  --   --   HEPARINUNFRC  --   --   --  1.38*   < > 0.25* 1.80* <0.10*  CREATININE 1.53*  --  1.57*  --   --  1.39*  --   --   TROPONINI  --  0.06* 1.36* 1.74*  --   --   --   --    < > = values in this interval not displayed.    Estimated Creatinine Clearance: 62.1 mL/min (A) (by C-G formula based on SCr of 1.39 mg/dL (H)).   Assessment: 67 y.o. male admitted 5/16 s/p multiple abdominal surgeries and tracheostomy, now s/p VFib arrest tonight and found to have PE, for heparin  Heparin level earlier today elevated at 1.8, but thought to be drawn from PICC line where heparin was infusing.  Level redrawn from A-line, and now is undetectable, which isn't consistent with previous levels either.  Goal of Therapy:  Heparin level 0.3-0.7 units/ml Monitor platelets by anticoagulation protocol: Yes   Plan:  Increase Heparin 2300 units/hr Check heparin level in 6 hours.  Daily heparin level and CBC.  [place] Crane, Roylene Reason, BCCP Clinical Pharmacist Pager [phone]  10/02/2017 8:20 PM

## 2017-10-02 NOTE — Progress Notes (Signed)
ANTICOAGULATION CONSULT NOTE Pharmacy Consult for Heparin Indication: pulmonary embolus  No Known Allergies  Patient Measurements: Height: 5\' 7"  (170.2 cm) Weight: 250 lb 14.1 oz (113.8 kg) IBW/kg (Calculated) : 66.1 Heparin Dosing Weight: 90 kg  Vital Signs: Temp: 98.9 F (37.2 C) (06/13 0431) Temp Source: Oral (06/13 0431) BP: 134/55 (06/12 2028) Pulse Rate: 107 (06/12 2028)  Labs: Recent Labs    09/30/17 1134 09/30/17 2220 09/30/17 2221 10/01/17 0449  10/01/17 0947 10/01/17 1159 10/01/17 2008 10/02/17 0515  HGB 7.9* 9.1*  --  8.2*  --   --   --   --  7.9*  HCT 26.4* 30.8*  --  26.7*  --   --   --   --  25.9*  PLT 273 323  --  297  --   --   --   --  273  HEPARINUNFRC  --   --   --   --    < > 1.38* 0.18* 0.25* 0.25*  CREATININE 1.36* 1.53*  --  1.57*  --   --   --   --   --   TROPONINI  --   --  0.06* 1.36*  --  1.74*  --   --   --    < > = values in this interval not displayed.    Estimated Creatinine Clearance: 55 mL/min (A) (by C-G formula based on SCr of 1.57 mg/dL (H)).   Assessment: 67 y.o. male admitted 5/16 s/p multiple abdominal surgeries and tracheostomy, now s/p VFib arrest tonight and found to have PE, for heparin Goal of Therapy:  Heparin level 0.3-0.7 units/ml Monitor platelets by anticoagulation protocol: Yes   Plan:  Increase Heparin 2200 units/hr Check heparin level in 8 hours.   Caryl Pina 10/02/2017,6:07 AM

## 2017-10-03 ENCOUNTER — Inpatient Hospital Stay (HOSPITAL_COMMUNITY): Payer: PPO

## 2017-10-03 DIAGNOSIS — R778 Other specified abnormalities of plasma proteins: Secondary | ICD-10-CM

## 2017-10-03 DIAGNOSIS — R748 Abnormal levels of other serum enzymes: Secondary | ICD-10-CM

## 2017-10-03 DIAGNOSIS — I469 Cardiac arrest, cause unspecified: Secondary | ICD-10-CM

## 2017-10-03 DIAGNOSIS — R7989 Other specified abnormal findings of blood chemistry: Secondary | ICD-10-CM

## 2017-10-03 LAB — CBC
HCT: 26.6 % — ABNORMAL LOW (ref 39.0–52.0)
Hemoglobin: 8.1 g/dL — ABNORMAL LOW (ref 13.0–17.0)
MCH: 27.6 pg (ref 26.0–34.0)
MCHC: 30.5 g/dL (ref 30.0–36.0)
MCV: 90.8 fL (ref 78.0–100.0)
PLATELETS: 273 10*3/uL (ref 150–400)
RBC: 2.93 MIL/uL — AB (ref 4.22–5.81)
RDW: 15.3 % (ref 11.5–15.5)
WBC: 9.4 10*3/uL (ref 4.0–10.5)

## 2017-10-03 LAB — MAGNESIUM: Magnesium: 2 mg/dL (ref 1.7–2.4)

## 2017-10-03 LAB — HEPARIN LEVEL (UNFRACTIONATED)
HEPARIN UNFRACTIONATED: 0.28 [IU]/mL — AB (ref 0.30–0.70)
Heparin Unfractionated: 0.26 IU/mL — ABNORMAL LOW (ref 0.30–0.70)
Heparin Unfractionated: 0.44 IU/mL (ref 0.30–0.70)

## 2017-10-03 LAB — GLUCOSE, CAPILLARY
GLUCOSE-CAPILLARY: 107 mg/dL — AB (ref 65–99)
GLUCOSE-CAPILLARY: 109 mg/dL — AB (ref 65–99)
GLUCOSE-CAPILLARY: 117 mg/dL — AB (ref 65–99)
GLUCOSE-CAPILLARY: 129 mg/dL — AB (ref 65–99)
GLUCOSE-CAPILLARY: 135 mg/dL — AB (ref 65–99)
Glucose-Capillary: 116 mg/dL — ABNORMAL HIGH (ref 65–99)
Glucose-Capillary: 128 mg/dL — ABNORMAL HIGH (ref 65–99)

## 2017-10-03 LAB — BASIC METABOLIC PANEL
ANION GAP: 11 (ref 5–15)
Anion gap: 6 (ref 5–15)
BUN: 27 mg/dL — AB (ref 6–20)
BUN: 26 mg/dL — ABNORMAL HIGH (ref 6–20)
CALCIUM: 7.2 mg/dL — AB (ref 8.9–10.3)
CHLORIDE: 119 mmol/L — AB (ref 101–111)
CO2: 20 mmol/L — ABNORMAL LOW (ref 22–32)
CO2: 19 mmol/L — AB (ref 22–32)
CREATININE: 1.36 mg/dL — AB (ref 0.61–1.24)
CREATININE: 1.4 mg/dL — AB (ref 0.61–1.24)
Calcium: 7.5 mg/dL — ABNORMAL LOW (ref 8.9–10.3)
Chloride: 118 mmol/L — ABNORMAL HIGH (ref 101–111)
GFR calc non Af Amer: 52 mL/min — ABNORMAL LOW (ref >60)
GFR, EST AFRICAN AMERICAN: 59 mL/min — AB (ref >60)
GFR, EST NON AFRICAN AMERICAN: 50 mL/min — AB (ref >60)
Glucose, Bld: 127 mg/dL — ABNORMAL HIGH (ref 65–99)
Glucose, Bld: 137 mg/dL — ABNORMAL HIGH (ref 65–99)
Potassium: 3.6 mmol/L (ref 3.5–5.1)
Potassium: 4.3 mmol/L (ref 3.5–5.1)
SODIUM: 148 mmol/L — AB (ref 135–145)
Sodium: 145 mmol/L (ref 135–145)

## 2017-10-03 LAB — ECHOCARDIOGRAM LIMITED
HEIGHTINCHES: 67 in
Weight: 3971.81 oz

## 2017-10-03 LAB — PHOSPHORUS: Phosphorus: 2 mg/dL — ABNORMAL LOW (ref 2.5–4.6)

## 2017-10-03 MED ORDER — PERFLUTREN LIPID MICROSPHERE
1.0000 mL | INTRAVENOUS | Status: AC | PRN
  Administered 2017-10-03: 2 mL via INTRAVENOUS
  Filled 2017-10-03: qty 10

## 2017-10-03 MED ORDER — METOPROLOL TARTRATE 5 MG/5ML IV SOLN
5.0000 mg | Freq: Four times a day (QID) | INTRAVENOUS | Status: DC
  Administered 2017-10-03 – 2017-10-04 (×5): 5 mg via INTRAVENOUS
  Filled 2017-10-03 (×6): qty 5

## 2017-10-03 MED ORDER — ACETAMINOPHEN 325 MG PO TABS
650.0000 mg | ORAL_TABLET | Freq: Four times a day (QID) | ORAL | Status: DC
  Administered 2017-10-03 – 2017-10-06 (×12): 650 mg via ORAL
  Filled 2017-10-03 (×13): qty 2

## 2017-10-03 MED ORDER — FENTANYL CITRATE (PF) 100 MCG/2ML IJ SOLN
50.0000 ug | Freq: Once | INTRAMUSCULAR | Status: AC
  Administered 2017-10-03: 50 ug via INTRAVENOUS
  Filled 2017-10-03: qty 2

## 2017-10-03 MED ORDER — ATORVASTATIN CALCIUM 80 MG PO TABS
80.0000 mg | ORAL_TABLET | Freq: Every day | ORAL | Status: DC
  Administered 2017-10-03 – 2017-10-07 (×5): 80 mg
  Filled 2017-10-03 (×4): qty 1

## 2017-10-03 MED ORDER — POTASSIUM CHLORIDE 20 MEQ/15ML (10%) PO SOLN
20.0000 meq | Freq: Every day | ORAL | Status: AC
  Administered 2017-10-03: 20 meq
  Filled 2017-10-03: qty 15

## 2017-10-03 MED ORDER — ACETAMINOPHEN 325 MG PO TABS
650.0000 mg | ORAL_TABLET | Freq: Four times a day (QID) | ORAL | Status: DC
  Filled 2017-10-03: qty 2

## 2017-10-03 NOTE — Evaluation (Signed)
[facility] - Evaluation Patient Details  Name: Luke Kaufman MRN: [id number] Date of Birth: [birth year]  Today's Date: 10/03/2017 Time: 1220-1234 SLP Time Calculation (min) (ACUTE ONLY): 14 min  Past Medical History:  Past Medical History:  Diagnosis Date  . Gastric ulcer   . Hyperlipemia   . Hypertension   . MI (myocardial infarction) ([place])   . Stroke ([place])   . Tobacco abuse    Past Surgical History:  Past Surgical History:  Procedure Laterality Date  . APPLICATION OF WOUND VAC N/A 09/04/2017   Procedure: APPLICATION OF WOUND VAC and Exploration of Abdomen.;  Surgeon: Elam Dutch, MD;  Location: [place];  Service: Vascular;  Laterality: N/A;  . APPLICATION OF WOUND VAC N/A 09/06/2017   Procedure: APPLICATION OF WOUND VAC;  Surgeon: Georganna Skeans, MD;  Location: [place];  Service: General;  Laterality: N/A;  . APPLICATION OF WOUND VAC N/A 09/08/2017   Procedure: APPLICATION OF WOUND VAC;  Surgeon: Judeth Horn, MD;  Location: [place];  Service: General;  Laterality: N/A;  . APPLICATION OF WOUND VAC N/A 09/12/2017   Procedure: APPLICATION OF WOUND VAC  (CHANGE);  Surgeon: Judeth Horn, MD;  Location: [place];  Service: General;  Laterality: N/A;  . APPLICATION OF WOUND VAC N/A 09/14/2017   Procedure: APPLICATION OF WOUND VAC;  Surgeon: Greer Pickerel, MD;  Location: [place];  Service: General;  Laterality: N/A;  . APPLICATION OF WOUND VAC N/A 09/23/2017   Procedure: APPLICATION OF NEGATIVE PRESSURE THERAPY;  Surgeon: Kieth Brightly Arta Bruce, MD;  Location: [place];  Service: General;  Laterality: N/A;  . BYPASS GRAFT AORTA TO AORTA  09/04/2017   Procedure: Aorta to Superior Mesinteric aorta bypass ultrason Left common Femoral/;  Surgeon: Elam Dutch, MD;  Location: [facility] OR;  Service: Vascular;;  . ESOPHAGOGASTRODUODENOSCOPY N/A 08/09/2014   Procedure: ESOPHAGOGASTRODUODENOSCOPY (EGD);  Surgeon: Jerene Bears, MD;  Location: [facility];  Service: Gastroenterology;  Laterality:  N/A;  . ESOPHAGOGASTRODUODENOSCOPY N/A 08/10/2014   Procedure: ESOPHAGOGASTRODUODENOSCOPY (EGD);  Surgeon: Jerene Bears, MD;  Location: [facility];  Service: Gastroenterology;  Laterality: N/A;  . ILEOSTOMY Right 09/06/2017   Procedure: POSSIBLE ILEOSTOMY;  Surgeon: Georganna Skeans, MD;  Location: [place];  Service: General;  Laterality: Right;  . INSERTION OF MESH N/A 09/23/2017   Procedure: INSERTION OF MESH;  Surgeon: Kinsinger, Arta Bruce, MD;  Location: [place];  Service: General;  Laterality: N/A;  . LAPAROTOMY Right 09/04/2017   Procedure: EXPLORATORY LAPAROTOMY Right Colon Resection;  Surgeon: Elam Dutch, MD;  Location: [facility] OR;  Service: Vascular;  Laterality: Right;  . LAPAROTOMY N/A 09/06/2017   Procedure: EXPLORATORY LAPAROTOMY;  Surgeon: Georganna Skeans, MD;  Location: [place];  Service: General;  Laterality: N/A;  . LAPAROTOMY N/A 09/08/2017   Procedure: EXPLORATORY LAPAROTOMY, ABDOMINAL WASHOUT, PARTIAL CLOSURE OF ABDOMEN;  Surgeon: Judeth Horn, MD;  Location: [place];  Service: General;  Laterality: N/A;  . LAPAROTOMY N/A 09/10/2017   Procedure: EXPLORATORY LAPAROTOMY WITH POSSIBLE WOUND CLOSURE ABDOMEN;  Surgeon: Judeth Horn, MD;  Location: [place];  Service: General;  Laterality: N/A;  . LAPAROTOMY N/A 09/12/2017   Procedure: EXPLORATORY LAPAROTOMY;  Surgeon: Judeth Horn, MD;  Location: [place];  Service: General;  Laterality: N/A;  . LAPAROTOMY N/A 09/14/2017   Procedure: EXPLORATORY LAPAROTOMY;  Surgeon: Greer Pickerel, MD;  Location: [place];  Service: General;  Laterality: N/A;  . LAPAROTOMY N/A 09/17/2017   Procedure: EXPLORATORY LAPAROTOMY AND PARTIAL CLOSURE OF ABDOMEN;  Surgeon: Judeth Horn,  MD;  Location: [place];  Service: General;  Laterality: N/A;  . LAPAROTOMY N/A 09/19/2017   Procedure: EXPLORATORY LAPAROTOMY & DRESSING CHANGE;  Surgeon: Coralie Keens, MD;  Location: [place];  Service: General;  Laterality: N/A;  . LAPAROTOMY N/A 09/23/2017   Procedure: EXPLORATORY LAPAROTOMY,  INCISIONAL HERNIA REPAIR WITH MESH INSERTION, PARTIAL CLOSURE OF SKIN;  Surgeon: Kinsinger, Arta Bruce, MD;  Location: [place];  Service: General;  Laterality: N/A;  . TRACHEOSTOMY TUBE PLACEMENT N/A 09/12/2017   Procedure: TRACHEOSTOMY;  Surgeon: Judeth Horn, MD;  Location: [place];  Service: General;  Laterality: N/A;  . 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 Fascia remains open, agitation barrier to weaning. Trached 5/24., with return to OR 5/26, 5/29 now with wound vac.     Assessment / Plan / Recommendation Clinical Impression  PMSV evaluation was completed. Patient with Shiley #8 cuffed trach placed 09/12/2017.  Nursing reported that there is plan to change trach out to a Shiley #6 cuffless.  Nursing reporting high suction needs due to secretions.  She stated that patient inconsistently follows commands but that he has been attempting to mouth words.  When ST entered room cuff was deflated and he did not require any suctioning as it had been done recently by RT.  Finger occlusion was completed with mild back pressure felt and no phonation noted.  He did not appear to have much upper respiratory airflow.  Given this PMSV was not placed.  ST will continue to follow for possible PMSV placement after Shiley #6 cuffless trach is placed.   SLP Visit Diagnosis: Aphonia (R49.1)    SLP Assessment  Patient needs continued Speech Lanaguage Pathology Services    Follow Up Recommendations  Skilled Nursing facility    Frequency and Duration min 2x/week  2 weeks    PMSV Trial PMSV was placed for: 0 Able to redirect subglottic air through upper airway: No(with finger occlusion) Able to  Attain Phonation: No Able to Expectorate Secretions: No attempts Level of Secretion Expectoration with PMSV: Not observed Behavior: Alert;Confused   Tracheostomy Tube       Vent Dependency  FiO2 (%): 28 %    Cuff Deflation Trial  GO Tolerated Cuff Deflation: Yes Length of Time for Cuff Deflation Trial: (deflated when ST entered room) Behavior: Alert;Confused         Shelly Flatten, MA, CCC-SLP Acute Rehab SLP [phone] Lamar Sprinkles 10/03/2017, 12:58 PM

## 2017-10-03 NOTE — Progress Notes (Signed)
[facility]/Trauma Progress Note  10 Days Post-Op   Assessment/Plan Principal Problem:   Mesenteric ischemia (HCC) Active Problems:   Essential hypertension   Arterial atherosclerosis   HLD (hyperlipidemia)   Abdominal pain   PUD (peptic ulcer disease)   Occlusion of superior mesenteric artery (HCC)   Respiratory failure (HCC)   Acute respiratory failure with hypoxemia (HCC)   AKI (acute kidney injury) ([place])  Mesenteric ischemia 1.Exploratory laparotomy right hemicolectomy 09/04/2017 Dr. Stark Klein 2.Abdominal exploration, application of temporary abdominal closure device(ABTHERA), 09/04/2017 Dr. Nadeen Landau 3.Oratory laparotomy, ileostomy, application of open abdominal wound VAC, 09/06/2017 Dr. Georganna Skeans 4. Open abdomen for attempted closure, 09/08/2017 Dr. Judeth Horn 5. Oratory laparotomy with possible wound closure(prior sutures upper&lower portion of the fascia had broken), 09/10/2017 Dr. Judeth Horn 6. Reexploration open abdominal wound, application ofABTHERAWOUND VAC,09/14/2017 Dr. Greer Pickerel 7. Exploratory laparotomy and partial closure of abdomen using the elastic tension closure device (ABRA), Dr. Hulen Skains, 05/29 8. exploratory laparotomy and dressing change, Dr. Ninfa Linden, 05/31 9. Changed negative pressure dressing, Dr. Kieth Brightly, 06/03 10.reopening of recent laparotomy, incisional hernia repair, insertion of mesh, placement of negative pressure dressing, Dr. Kieth Brightly, 06/04 11. Bedside vac change, Dr. Kieth Brightly, 06/07  - no fevers and WBC normal - extubated on trach collar, CCM wants to change to a 6 cuff - PE with brief cardiac arrest now on heparin, cards following, ECHO pending - L sided weakness CCM considering MRI, not present on admission. CCM spoke to sister  - tolerating TF's, stool output down with immodium  HER:DEYC feeds VTE: SCD's, heparin XK:GYJE currently Foley:yes Follow up:TBD  DISPO:vac changetoday  at bedside by surgery team.     LOS: 29 days    Subjective: CC: open abdomen  No acute events overnight. Awake on trach collar. Follows some commands. No family at bedside  Objective: Vital signs in last 24 hours: Temp:  [96.3 F (35.7 C)-100.3 F (37.9 C)] 98.7 F (37.1 C) (06/14 0732) Pulse Rate:  [102-134] 102 (06/14 0724) Resp:  [9-32] 30 (06/14 0724) BP: (140)/(56) 140/56 (06/13 1208) SpO2:  [85 %-99 %] 98 % (06/14 0724) Arterial Line BP: (128-194)/(43-64) 141/50 (06/14 0500) FiO2 (%):  [28 %-40 %] 28 % (06/14 0724) Weight:  [112.6 kg (248 lb 3.8 oz)] 112.6 kg (248 lb 3.8 oz) (06/14 0500) Last BM Date: 10/02/17  Intake/Output from previous day: 06/13 0701 - 06/14 0700 In: 2692.2 [I.V.:710.2; HU/DJ:4970; IV Piggyback:50] Out: 2925 [Urine:1125; Drains:500; YOVZC:5885] Intake/Output this shift: No intake/output data recorded.  PE: Gen:On trach collar, awake Card:tachycardia Pulm:on trach collar OYD:XAJOI vac with tan output, air leak.Good BS. Tansemisolid stool in ostomy bag Neuro: following some commands Skin: warm and dry  Anti-infectives: Anti-infectives (From admission, onward)   Start     Dose/Rate Route Frequency Ordered Stop   09/19/17 0730  ceFAZolin (ANCEF) IVPB 2g/100 mL premix     2 g 200 mL/hr over 30 Minutes Intravenous On call to O.R. 09/19/17 0715 09/19/17 1057   09/17/17 1145  ceFAZolin (ANCEF) 3 g in dextrose 5 % 50 mL IVPB     3 g 100 mL/hr over 30 Minutes Intravenous To ShortStay Surgical 09/17/17 0923 09/17/17 1449   09/07/17 1545  cefTRIAXone (ROCEPHIN) 2 g in sodium chloride 0.9 % 100 mL IVPB     2 g 200 mL/hr over 30 Minutes Intravenous Every 24 hours 09/07/17 1543 09/11/17 1605   09/04/17 1400  piperacillin-tazobactam (ZOSYN) IVPB 3.375 g  Status:  Discontinued     3.375 g  12.5 mL/hr over 240 Minutes Intravenous Every 8 hours 09/04/17 1157 09/07/17 1541   09/04/17 1100  ceFAZolin (ANCEF) IVPB 2g/100 mL premix  Status:   Discontinued     2 g 200 mL/hr over 30 Minutes Intravenous Every 8 hours 09/04/17 1035 09/04/17 1157   09/04/17 0630  piperacillin-tazobactam (ZOSYN) IVPB 3.375 g  Status:  Discontinued     3.375 g 100 mL/hr over 30 Minutes Intravenous  Once 09/04/17 0617 09/04/17 0626   09/04/17 0619  piperacillin-tazobactam (ZOSYN) IVPB 3.375 g     3.375 g 100 mL/hr over 30 Minutes Intravenous 60 min pre-op 09/04/17 0620 09/04/17 0717   09/04/17 0030  piperacillin-tazobactam (ZOSYN) IVPB 3.375 g     3.375 g 100 mL/hr over 30 Minutes Intravenous  Once 09/04/17 0024 09/04/17 0144      Lab Results:  Recent Labs    10/02/17 0515 10/03/17 0441  WBC 8.9 9.4  HGB 7.9* 8.1*  HCT 25.9* 26.6*  PLT 273 273   BMET Recent Labs    10/02/17 0515 10/03/17 0441  NA 150* 145  K 3.4* 3.6  CL 118* 119*  CO2 22 20*  GLUCOSE 134* 127*  BUN 33* 27*  CREATININE 1.39* 1.36*  CALCIUM 7.5* 7.2*   PT/INR No results for input(s): LABPROT, INR in the last 72 hours. CMP     Component Value Date/Time   NA 145 10/03/2017 0441   K 3.6 10/03/2017 0441   CL 119 (H) 10/03/2017 0441   CO2 20 (L) 10/03/2017 0441   GLUCOSE 127 (H) 10/03/2017 0441   BUN 27 (H) 10/03/2017 0441   CREATININE 1.36 (H) 10/03/2017 0441   CALCIUM 7.2 (L) 10/03/2017 0441   PROT 5.4 (L) 10/01/2017 0449   ALBUMIN 2.6 (L) 10/01/2017 0449   AST 61 (H) 10/01/2017 0449   ALT 37 10/01/2017 0449   ALKPHOS 109 10/01/2017 0449   BILITOT 0.9 10/01/2017 0449   GFRNONAA 52 (L) 10/03/2017 0441   GFRAA >60 10/03/2017 0441   Lipase     Component Value Date/Time   LIPASE 37 09/03/2017 1644    Studies/Results: No results found.    Kalman Drape , [facility] 10/03/2017, 9:03 AM  Pager: [phone] Mon-Wed, Friday 7:00am-4:30pm Thurs 7am-11:30am  Consults: [phone]

## 2017-10-03 NOTE — Procedures (Signed)
Tracheostomy Change Note  Patient Details:   Name: Milbern Doescher DOB: [birth year] MRN: [id number]    Airway Documentation:     Evaluation  O2 sats: stable throughout Complications: No apparent complications Patient did tolerate procedure well. Bilateral Breath Sounds: Rhonchi    Trach changed per MD order.  Second RT present.  #8XLT removed and replaced with #4OEV without complication. Pt had good movement, +ETco2 color change. Sx catheter passed easily.  RT will monitor.   Ciro Backer 10/03/2017, 3:36 PM

## 2017-10-03 NOTE — Progress Notes (Signed)
ANTICOAGULATION CONSULT NOTE Pharmacy Consult for Heparin Indication: pulmonary embolus  No Known Allergies  Patient Measurements: Height: 5\' 7"  (170.2 cm) Weight: 248 lb 3.8 oz (112.6 kg) IBW/kg (Calculated) : 66.1 Heparin Dosing Weight: 90 kg  Vital Signs: Temp: 97.8 F (36.6 C) (06/14 1923) Temp Source: Oral (06/14 1923) Pulse Rate: 107 (06/14 2015)  Labs: Recent Labs    09/30/17 2221 10/01/17 0449 10/01/17 0947  10/02/17 0515  10/03/17 0441 10/03/17 0507 10/03/17 1348 10/03/17 1954  HGB  --  8.2*  --   --  7.9*  --  8.1*  --   --   --   HCT  --  26.7*  --   --  25.9*  --  26.6*  --   --   --   PLT  --  297  --   --  273  --  273  --   --   --   HEPARINUNFRC  --   --  1.38*   < > 0.25*   < >  --  0.28* 0.26* 0.44  CREATININE  --  1.57*  --   --  1.39*  --  1.36*  --   --   --   TROPONINI 0.06* 1.36* 1.74*  --   --   --   --   --   --   --    < > = values in this interval not displayed.    Estimated Creatinine Clearance: 63.1 mL/min (A) (by C-G formula based on SCr of 1.36 mg/dL (H)).   Assessment: 67 y.o. male admitted 5/16 s/p multiple abdominal surgeries and tracheostomy, now s/p VFib arrest and found to have PE, for heparin  Issues with drawing levels, but last two have been drawn peripherally.  Heparin level therapeutic  Goal of Therapy:  Heparin level 0.3-0.7 units/ml Monitor platelets by anticoagulation protocol: Yes   Plan:  Continue heparin at 2600 units / hr Daily heparin level and CBC.  Thank you Anette Guarneri, PharmD [phone] 10/03/2017 10:03 PM

## 2017-10-03 NOTE — Progress Notes (Signed)
Cortrak Tube Team Note:  Consult received to place a Cortrak feeding tube.   A 10 F Cortrak tube was placed in the LEFT nare and secured with a nasal bridle at 96 cm. Per the Cortrak monitor reading the tube tip is post-pyloric, in D3.   No x-ray is required. RN may begin using tube.   If the tube becomes dislodged please keep the tube and contact the Cortrak team at [email or web address] (password [email or web address]) for replacement.  If after hours and replacement cannot be delayed, place a NG tube and confirm placement with an abdominal x-ray.    Gaynell Face, MS, RD, LDN Pager: [phone] Weekend/After Hours: [phone]

## 2017-10-03 NOTE — Progress Notes (Addendum)
   Patient Status: [facility] - In-pt  Assessment and Plan: Patient in need of venous access.   Has existing LUA PICC Placed by PICC team Has B upper and L lower extr DVT  Scheduled for tunneled central catheter placement ______________________________________________________________________   History of Present Illness: Luke Kaufman is Kaufman 67 y.o. male   [facility] NP note today: 67 year old man with history of severe vascular disease who unfortunately experienced an acute SMA occlusion that ultimately resulted in Kaufman hemicolectomy and ileostomy.  He was unable to have his abdomen closed surgically and there is still Kaufman wound VAC in place, last changed on 6/12.  He underwent tracheostomy on 5/24.  Subsequent course complicated by Kaufman VT arrest with Kaufman new cardiomyopathy, as well as DVT and Kaufman small left lower lobe pulmonary embolism.  He is on anticoagulation for both, as well as atrial fibrillation  Requesting tunneled central catheter for stable IV access CCM will decide on plans for PICC  Scheduled for tunneled CC to be placed in IR 6/15  Allergies and medications reviewed.   Review of Systems: Kaufman 12 point ROS discussed and pertinent positives are indicated in the HPI above.  All other systems are negative.   Vital Signs: BP (!) 140/56   Pulse 94   Temp 99.9 F (37.7 C) (Axillary)   Resp (!) 25   Ht 5\' 7"  (1.702 m)   Wt 248 lb 3.8 oz (112.6 kg)   SpO2 95%   BMI 38.88 kg/m   Physical Exam  Constitutional:  trach  Cardiovascular: Regular rhythm.  Pulmonary/Chest:  Trach collar  Abdominal:  Open wound-- wound vac  Nursing note and vitals reviewed.    Imaging reviewed.   Labs:  COAGS: Recent Labs    09/04/17 1036  INR 1.60  APTT 56*    BMP: Recent Labs    09/30/17 2220 10/01/17 0449 10/02/17 0515 10/03/17 0441  NA 149* 150* 150* 145  K 3.9 3.3* 3.4* 3.6  CL 116* 119* 118* 119*  CO2 17* 22 22 20*  GLUCOSE 159* 117* 134* 127*  BUN 37* 39* 33* 27*    CALCIUM 7.9* 7.6* 7.5* 7.2*  CREATININE 1.53* 1.57* 1.39* 1.36*  GFRNONAA 45* 44* 51* 52*  GFRAA 53* 51* 59* >60       Electronically Signed: Tryson Lumley A, PA-C 10/03/2017, 2:31 PM   I spent Kaufman total of 15 minutes in face to face in clinical consultation, greater than 50% of which was counseling/coordinating care for venous access.Patient ID: Luke Kaufman, male   DOB: 12-07-1950, 67 y.o.   MRN: [id number]

## 2017-10-03 NOTE — Progress Notes (Signed)
ANTICOAGULATION CONSULT NOTE Pharmacy Consult for Heparin Indication: pulmonary embolus  No Known Allergies  Patient Measurements: Height: 5\' 7"  (170.2 cm) Weight: 248 lb 3.8 oz (112.6 kg) IBW/kg (Calculated) : 66.1 Heparin Dosing Weight: 90 kg  Vital Signs: Temp: 100.3 F (37.9 C) (06/14 0400) Temp Source: Axillary (06/14 0400) Pulse Rate: 109 (06/14 0330)  Labs: Recent Labs    09/30/17 2220 09/30/17 2221 10/01/17 0449 10/01/17 0947  10/02/17 0515 10/02/17 1400 10/02/17 1730 10/03/17 0441 10/03/17 0507  HGB 9.1*  --  8.2*  --   --  7.9*  --   --  8.1*  --   HCT 30.8*  --  26.7*  --   --  25.9*  --   --  26.6*  --   PLT 323  --  297  --   --  273  --   --  273  --   HEPARINUNFRC  --   --   --  1.38*   < > 0.25* 1.80* <0.10*  --  0.28*  CREATININE 1.53*  --  1.57*  --   --  1.39*  --   --   --   --   TROPONINI  --  0.06* 1.36* 1.74*  --   --   --   --   --   --    < > = values in this interval not displayed.    Estimated Creatinine Clearance: 61.8 mL/min (A) (by C-G formula based on SCr of 1.39 mg/dL (H)).   Assessment: 67 y.o. male admitted 5/16 s/p multiple abdominal surgeries and tracheostomy, now s/p VFib arrest tonight and found to have PE, for heparin  Heparin level 0.28 units/ml Goal of Therapy:  Heparin level 0.3-0.7 units/ml Monitor platelets by anticoagulation protocol: Yes   Plan:  Increase Heparin drip slightly to 2400 units/hr Daily heparin level and CBC.  Thanks for allowing pharmacy to be a part of this patient's care.  Excell Seltzer, PharmD Clinical Pharmacist

## 2017-10-03 NOTE — Progress Notes (Signed)
ANTICOAGULATION CONSULT NOTE Pharmacy Consult for Heparin Indication: pulmonary embolus  No Known Allergies  Patient Measurements: Height: 5\' 7"  (170.2 cm) Weight: 248 lb 3.8 oz (112.6 kg) IBW/kg (Calculated) : 66.1 Heparin Dosing Weight: 90 kg  Vital Signs: Temp: 99.9 F (37.7 C) (06/14 1242) Temp Source: Axillary (06/14 1242) Pulse Rate: 96 (06/14 1501)  Labs: Recent Labs    09/30/17 2221 10/01/17 0449 10/01/17 0947  10/02/17 0515  10/02/17 1730 10/03/17 0441 10/03/17 0507 10/03/17 1348  HGB  --  8.2*  --   --  7.9*  --   --  8.1*  --   --   HCT  --  26.7*  --   --  25.9*  --   --  26.6*  --   --   PLT  --  297  --   --  273  --   --  273  --   --   HEPARINUNFRC  --   --  1.38*   < > 0.25*   < > <0.10*  --  0.28* 0.26*  CREATININE  --  1.57*  --   --  1.39*  --   --  1.36*  --   --   TROPONINI 0.06* 1.36* 1.74*  --   --   --   --   --   --   --    < > = values in this interval not displayed.    Estimated Creatinine Clearance: 63.1 mL/min (A) (by C-G formula based on SCr of 1.36 mg/dL (H)).   Assessment: 67 y.o. male admitted 5/16 s/p multiple abdominal surgeries and tracheostomy, now s/p VFib arrest and found to have PE, for heparin  Issues with drawing levels, but last two have been drawn peripherally. Most recent level dropped t 0.26units/mL despite a rate increase earlier today. No overt bleeding noted. VAC changed today- still with open abdomen.  Goal of Therapy:  Heparin level 0.3-0.7 units/ml Monitor platelets by anticoagulation protocol: Yes   Plan:  Increase Heparin to 2600 units/hr Check heparin level in 6 hours.  Daily heparin level and CBC.  Luke Kaufman, PharmD, BCPS Clinical Pharmacist [phone] Please check AMION for all [facility] numbers 10/03/2017 3:15 PM

## 2017-10-03 NOTE — Progress Notes (Addendum)
Progress Note  Patient Name: Luke Kaufman Date of Encounter: 10/03/2017  Primary Cardiologist: No primary care provider on file.   Subjective   Seems more awake today.  HR improved with IV Lopressor and BP stable.  No further ventricular arrhythmias.   Inpatient Medications    Scheduled Meds: . acetaminophen  650 mg Oral Q6H  . chlorhexidine gluconate (MEDLINE KIT)  15 mL Mouth Rinse BID  . Chlorhexidine Gluconate Cloth  6 each Topical Daily  . feeding supplement (PRO-STAT SUGAR FREE 64)  60 mL Per Tube BID  . free water  300 mL Per Tube Q6H  . HYDROmorphone  1 mg Oral Q4H  . insulin aspart  0-15 Units Subcutaneous Q4H  . loperamide HCl  4 mg Oral BID  . mouth rinse  15 mL Mouth Rinse 10 times per day  . metoprolol tartrate  2.5 mg Intravenous Q6H  . sodium chloride flush  10-40 mL Intracatheter Q12H   Continuous Infusions: . sodium chloride 10 mL/hr at 10/03/17 1300  . sodium chloride    . famotidine (PEPCID) IV Stopped (10/03/17 0908)  . feeding supplement (OSMOLITE 1.2 CAL) 65 mL/hr at 10/03/17 1300  . heparin 2,400 Units/hr (10/03/17 1427)   PRN Meds: fentaNYL, HYDROmorphone, ipratropium-albuterol, midazolam, pneumococcal 23 valent vaccine, sodium chloride flush   Vital Signs    Vitals:   10/03/17 1300 10/03/17 1501 10/03/17 1532 10/03/17 1540  BP:      Pulse:  96 (!) 113 (!) 110  Resp: (!) 25 20 (!) 22 (!) 22  Temp:      TempSrc:      SpO2: 95% 98% 98% 98%  Weight:      Height:        Intake/Output Summary (Last 24 hours) at 10/03/2017 1552 Last data filed at 10/03/2017 1300 Gross per 24 hour  Intake 3530.19 ml  Output 1675 ml  Net 1855.19 ml   Filed Weights   10/01/17 0600 10/02/17 0530 10/03/17 0500  Weight: 251 lb 12.3 oz (114.2 kg) 250 lb 14.1 oz (113.8 kg) 248 lb 3.8 oz (112.6 kg)    Telemetry    NSR with APCs - Personally Reviewed  ECG    NSR with nonspecific ST abnormality - Personally Reviewed  Physical Exam   GEN: ill  appearing Neck: No JVD Cardiac: RRR, no murmurs, rubs, or gallops.  Respiratory: Clear to auscultation bilaterally. GI: open wound MS: No edema; No deformity. Neuro:  unable to assess Psych: unable to assess  Labs    Chemistry Recent Labs  Lab 09/30/17 1134 09/30/17 2220 10/01/17 0449 10/02/17 0515 10/03/17 0441  NA 149* 149* 150* 150* 145  K 3.5 3.9 3.3* 3.4* 3.6  CL 120* 116* 119* 118* 119*  CO2 22 17* 22 22 20*  GLUCOSE 122* 159* 117* 134* 127*  BUN 40* 37* 39* 33* 27*  CREATININE 1.36* 1.53* 1.57* 1.39* 1.36*  CALCIUM 7.9* 7.9* 7.6* 7.5* 7.2*  PROT 5.9* 5.7* 5.4*  --   --   ALBUMIN 2.4* 2.8* 2.6*  --   --   AST 28 54* 61*  --   --   ALT 24 30 37  --   --   ALKPHOS 81 117 109  --   --   BILITOT 0.5 0.7 0.9  --   --   GFRNONAA 52* 45* 44* 51* 52*  GFRAA >60 53* 51* 59* >60  ANIONGAP 7 16* _0 Hematology Recent Labs  Lab  10/01/17 0449 10/02/17 0515 10/03/17 0441  WBC 15.6* 8.9 9.4  RBC 2.89* 2.83* 2.93*  HGB 8.2* 7.9* 8.1*  HCT 26.7* 25.9* 26.6*  MCV 92.4 91.5 90.8  MCH 28.4 27.9 27.6  MCHC 30.7 30.5 30.5  RDW 15.3 15.4 15.3  PLT 297 273 273    Cardiac Enzymes Recent Labs  Lab 09/30/17 2221 10/01/17 0449 10/01/17 0947  TROPONINI 0.06* 1.36* 1.74*   No results for input(s): TROPIPOC in the last 168 hours.   BNPNo results for input(s): BNP, PROBNP in the last 168 hours.   DDimer No results for input(s): DDIMER in the last 168 hours.   Radiology    No results found.  Cardiac Studies   10/01/2017 2D echo Study Conclusions  - Left ventricle: Systolic function was mildly to moderately   reduced. The estimated ejection fraction was in the range of 40%   to 45%. Severe hypokinesis of the anteroseptal and anterior   myocardium; consistent with ischemia or infarction in the   distribution of the left anterior descending coronary artery.   However, the apex appears to be spared. Due to tachycardia, there   was fusion of early and atrial  contributions to ventricular   filling. The study is not technically sufficient to allow   evaluation of LV diastolic function. - Mitral valve: Calcified annulus.  Impressions:  - Very technically challenging study. Suggest an alternative   imaging technique such as CT or MRI.  2D echo limited with definity contrast Study Conclusions  - Left ventricle: The cavity size was normal. Wall thickness was   normal. Systolic function was normal. The estimated ejection   fraction was in the range of 60% to 65%. Wall motion was normal;   there were no regional wall motion abnormalities. The study is   not technically sufficient to allow evaluation of LV diastolic   function.  Patient Profile     67 y.o. male with a history of ASCAD with remote MI in 2015 treated medically.  He also has a history of HTN, hyperlipidemia, ongoing tobacco use admitted 09/03/2017 with severe abdominal pain.  CT scan showed portal venous gas and elevated lactate and mesenteric angiography showed ischemic right colon.  He underwent emergent exploratory lap with right hemicolectomy and ileostomy.  Since that surgery he is undergone several exploratory laparotomies with failed attempts at closure of his belly.  He now has an open belly.  He had failure to wean off the ventilator and underwent tracheostomy on 09/12/2017.  On 611 he suffered a V. fib arrest   Assessment & Plan    1. Vfib cardiac arrest - initial echo with LVF of 40-45%, Severe hypokinesis of the anteroseptal and anterior myocardium but poor windows made for difficult study.  Repeat 2D echocardiogram with Definity contrast showed normal LV function with no regional wall motion abnormalities. -Unclear etiology of arrest but worrisome for ischemia driven.  QTC was normal on the EKG the day prior to his arrest.  Potassium was anywhere from 3.5-3.9 the day of his arrest but did drop to 3.3 early that morning after his arrest. -Seroquel has been  discontinued -Echo with no regional wall motion abnormalities -Review of telemetry showed 10 beat run of wide-complex tachycardia the day prior to cardiac arrest -Continue on heparin drip for now which he is already on for acute PE/DVTs  -Ultimately needs cardiac catheterization but at this time feel he is too sick to proceed.  We will follow over the weekend and hopefully  can cath early next week. -need to keep K+>4 and Mag > 2.   2. NSTEMI, CAD, ?MI 2015, no stents placed per patient in previous records - On ASA and statin at home  - no records available in Epic concerning prior MI. [facility] was consulted in 2016 for mild troponin elevation. In that note, pt had previously followed with [facility], but no longer follows with cardiology. Questionable cardiac history.  - pt will need an ischemic workup, but will defer at this time given his ongoing problems. He will need to recover from his abdominal surgeries and be able to tolerate DAPT, if needed. He will be on anticoagulation for PE/DVTs and bouts of Afib.  -He has had an elevated troponin ever since he was admitted to the hospital initially starting at 0.5-0.29 and was thought to be due to demand ischemia in the setting of bowel ischemia but then jumped to 1.36 post cardiac arrest and is now trending upward still at 1.74. -Troponin elevation most likely multifactorial secondary to demand ischemia from his acute PE and multiple DVTs, ischemic bowel status post bowel resection, anemia and now status post V. fib cardiac arrest. -The echocardiogram did not show any regional wall motion normalities and LV function is normal. -Plan for cardiac catheterization hopefully early next week patient will need to be cleared from surgery to be able to tolerate dual antiplatelet therapy if he should need a stent as well as DOACfor his PE/DVT -Continue on IV Lopressor, IV heparin drip and add aspirin and high-dose statin therapy per NG tube  3.  Sinus  tachycardia, frequent PACs and nonsustained atrial tachycardia -There was documentation of PAF but I do not see any episodes of atrial fibrillation on review of telemetry.  There are multiple episodes of PACs post single, couplets triplets and nonsustained.  Atrial ectopy has significantly improved on IV Lopressor. -Increase IV lopressor 5 mg every 6 hours for better heart rate control as well as blood pressure control   4. HTN - home meds include norvasc and lopressor - on hold -Continue IV Lopressor and titrate as needed for blood pressure control  5. HLD - on statin at home -Start high-dose statin if we can give through NG tube  The patient is critically ill with multiple organ systems failure and requires high complexity decision making for assessment and support, frequent evaluation and titration of therapies, application of advanced monitoring technologies and extensive interpretation of multiple databases. Critical Care Time devoted to patient care services described in this note independent of APP time is 60 minutes with >50% of time spent in direct patient care.     For questions or updates, please contact [facility] Please consult [email or web address] for contact info under Cardiology/STEMI.      Signed, Fransico Him, MD  10/03/2017, 3:52 PM

## 2017-10-03 NOTE — Care Management Note (Signed)
Case Management Note Marvetta Gibbons RN,BSN [facility] 1-22 Case Manager  [id number]  Patient Details  Name: Luke Kaufman MRN: [id number] Date of Birth: Aug 10, 1950  Subjective/Objective:  Pt admitted 5/15 with abdominal pain, had an ischemic right colon.  Had a R hemicolectomy and eventually an ileostomy.   5/22 returned to OR for attempted closure however unable doe to current anatomy. Plans for trip back to OR on 5/24- remains on Vent currently                  Action/Plan: PTA pt lived at home, will most likely need PT/OT evals when able to participate- CM to follow for transition of care needs  Expected Discharge Date:                  Expected Discharge Plan:  Long Term Acute Care (LTAC)  In-House Referral:  Clinical Social Work  Discharge planning Services  CM Consult  Post Acute Care Choice:    Choice offered to:     DME Arranged:    DME Agency:     HH Arranged:    [place] Agency:     Status of Service:  In process, will continue to follow  If discussed at Long Length of Stay Meetings, dates discussed:    Discharge Disposition:   Additional Comments:  10/03/17- 1100- Marvetta Gibbons RN, CM- continue to follow pt for transition of care needs- pt has had long complicated coarse- now off vent on TC, brief cardiac arrest on 6/11 with ?new L sided weakness considering MRI, surgery continues to follow for abd wound needs-currently doing bedside VAC changes. Pt may be nearing point where LTACH can be considered- will assess early next week and speak with MD regarding LTACH referral.   Dawayne Patricia, RN 10/03/2017, 11:04 AM

## 2017-10-03 NOTE — Progress Notes (Signed)
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 Fascia remains open, agitation barrier to weaning. Trached 5/24., to return to OR 5/26, 5/29 now with wound vac   STUDIES:  CT ab/pelvis 5/15 > portal venous gas in liver, sigmoid colon with diverticular disease, atrophic left kidney CT head 6/12 > chronic ischemia, right maxillary sinus fluid level and bilateral mastoid and middle ear effusions. CTA chest 6/12 > LLL PE (RV / LV 0.89). Echo 6/12 > EF 40-45%, ? Ischemia or infarction in distribution of LAD given severe hypokinesis. UE duplex 6/12 > DVT in right brachial vein, SVT in left basilic and cephalic veins. LE dupled 6/12 > DVT in left CFV and FV.  Negative on right.  CULTURES: 5/17 resp > e coli resistant to multiple antibiotics 5/17 blood > negative  ANTIBIOTICS: 5/15 zosyn > 5/19  5/19 ceftriaxone > 5/23   LINES/TUBES: 5/16 R [place] CVL > 5/24 5/16 ETT > 5/24 5/24 Trach > 6/10 prox XLT placed >>> 5/18 R femoral arterial line > 5/24 5/24 LUA PICC   SIGNIFICANT EVENTS: 5/16 mesenteric angiogram and aorto to SMA bypass 5/16 ex lap R hemicolectomy 5/18 ileostomy 5/19 abdominal washout and partial fascial closure 5/21 TF started, hypoxia with turning  5/22 Return to OR for exploration, attempted closure > unable 5/24 Trach 5/26 OR for debridement and dressing change 5/29 Exploratory laparotomy, placement of ABBRA closure system 5/31 - No events overnight. Plans to return to OR for dressing change  6/11- tolerating SBT/ off sedation; Vfib arrest that evening 6/12 VAC changed  SUBJECTIVE/OVERNIGHT/INTERVAL  HX:  No acute events overnight VITAL SIGNS: Blood Pressure (Abnormal) 140/56   Pulse (Abnormal) 102   Temperature 98.7 F (37.1 C) (Axillary)   Respiration (Abnormal) 30   Height 5\' 7"  (1.702 m)   Weight 248 lb 3.8 oz (112.6 kg)   Oxygen Saturation 98%   Body Mass Index 38.88 kg/m   HEMODYNAMICS: CVP:  [0 mmHg-17 mmHg] 0 mmHg  VENTILATOR SETTINGS: FiO2 (%):  [28 %-40 %] 28 %  INTAKE / OUTPUT:  Intake/Output Summary (Last 24 hours) at 10/03/2017 0905 Last data filed at 10/03/2017 0544 Gross per 24 hour  Intake 2642.17 ml  Output 2925 ml  Net -282.83 ml     Physical exam  General: 67 year old white male currently awake, no acute distress On aerosol trach collar. HEENT normocephalic atraumatic.  He has a size 8 proximal XLT tracheostomy in place.  He is unable to phonate with tracheal occlusion.  He has some foul-smelling respiratory secretions noted.  They are yellow-tinged. Pulmonary: Scattered rhonchi, no accessory use. Cardiac: Currently regular rate and rhythm Extremities: Diffuse anasarca.  Particularly lower extremities, and left upper extremity Abdomen: Positive bowel sounds.  Currently wound dressing is deflated.  Copious stool from ostomy GU: Clear yellow Neuro: Awake, will follow commands, tries to communicate.  He has left-sided hemiparesis particularly left upper extremity this is new according to his sister   PULMONARY Recent Labs  Lab 09/28/17 0359 09/30/17 2157 09/30/17 2250 10/01/17 0459  PHART 7.465* 7.181* 7.419 7.458*  PCO2ART 27.6* 44.1 32.2 28.1*  PO2ART 102 82.0* 355* 127.0*  HCO3 19.5* 16.3* 20.2 19.8*  TCO2  --  18*  --  21*  O2SAT 97.6 92.0 99.6 99.0    CBC Recent Labs  Lab 10/01/17 0449 10/02/17 0515 10/03/17 0441  HGB 8.2* 7.9* 8.1*  HCT 26.7* 25.9* 26.6*  WBC 15.6* 8.9 9.4  PLT 297 273 273    COAGULATION No results for input(s): INR in the last 168 hours.  CARDIAC   Recent Labs  Lab 09/30/17 2221 10/01/17 0449  10/01/17 0947  TROPONINI 0.06* 1.36* 1.74*   No results for input(s): PROBNP in the last 168 hours.   CHEMISTRY Recent Labs  Lab 09/30/17 1134 09/30/17 2220 10/01/17 0028 10/01/17 0449 10/02/17 0515 10/03/17 0441  NA 149* 149*  --  150* 150* 145  K 3.5 3.9  --  3.3* 3.4* 3.6  CL 120* 116*  --  119* 118* 119*  CO2 22 17*  --  22 22 20*  GLUCOSE 122* 159*  --  117* 134* 127*  BUN 40* 37*  --  39* 33* 27*  CREATININE 1.36* 1.53*  --  1.57* 1.39* 1.36*  CALCIUM 7.9* 7.9*  --  7.6* 7.5* 7.2*  MG 2.5*  --  2.3 2.1 2.3 2.0  PHOS 3.2  --   --   --  2.8 2.0*   Estimated Creatinine Clearance: 63.1 mL/min (A) (by C-G formula based on SCr of 1.36 mg/dL (H)).   LIVER Recent Labs  Lab 09/27/17 0339 09/28/17 0359 09/30/17 1134 09/30/17 2220 10/01/17 0449  AST 64* 56* 28 54* 61*  ALT 41 38 24 30 37  ALKPHOS 117 107 81 117 109  BILITOT 0.6 0.5 0.5 0.7 0.9  PROT 5.2* 5.3* 5.9* 5.7* 5.4*  ALBUMIN 1.3* 1.3* 2.4* 2.8* 2.6*     INFECTIOUS Recent Labs  Lab 09/26/17 1133  09/30/17 2214 09/30/17 2221 10/01/17 0220 10/01/17 0449 10/02/17 0515  LATICACIDVEN 1.4  --  6.0*  --  1.2  --   --   PROCALCITON 0.59   < >  --  0.23  --  1.47 1.80   < > = values in this interval not displayed.     ENDOCRINE CBG (last 3)  Recent Labs    10/03/17 0038 10/03/17 0419 10/03/17 0741  GLUCAP 109* 116* 135*     IMAGING x48h  - image(s) personally visualized  -   highlighted in bold No results found. 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  Most recently had mesh placed.  He is been on IV sedation for a prolonged period of time and is at a very high risk for prolonged encephalopathy.  Progressive day weaning on MV and off sedation 6/11.  Subsequent Vfib arrest 6/11 & possible acute CVA w/ left sided  hemiparesis.   ASSESSMENT / PLAN:   Ischemic bowel, s/p resection with hemicolectomy and ileostomy; now status post several washouts.  Severe protein calorie malnutrition Plan Tube feed per surgery Wound care per surgery  Acute respiratory failure with hypoxemia with acute pulmonary edema  Status post tracheostomy 5/24 - transitioned down to ATC on 6/12 and tolerating well LLE PE H/O COPD Plan: Continue supplemental oxygen.  Via aerosol trach collar Scheduled bronchodilators Heparin for PE Down size trach to 6 cuffless Ask SLP to start PMV after downsize  Vfib arrest 6/11 - unclear etiology at this  point- concern for embolic event, given hx, no significant electrolytes abnormalities on labs, EKG non-acute but new Afib Non-ST elevation MI- echo with severe hypokinesis and significantly reduced EF (40-45%)  New onset Afib 6/11 Shock -6/11 - resolved History of coronary artery disease, prior MI, hypertension and peripheral vascular disease Plan Cardiology following, repeated echocardiogram, awaiting results Continue telemetry monitoring Continuing anticoagulation Further recommendations pending ongoing cardiology evaluation  Acute kidney injury--> improving Plan Avoid hypotension Trend intake output Renal dose medications when appropriate  Fluid and electrolyte imbalance: Hypernatremia/hyperchloremia-> improved Hypocalcemia: pseudo Plan: Continuing current free water dosing Trend chemistries  Acute encephalopathy w/ hypoactive delirium Left sided weakness- ? More pronounced after cardiac arrest overnight 6/11.  CT head negative. Plan RASS goal 0 Continuing Seroquel 50 mg via tube at at bedtime Monitoring QTC; last one was 420  Anemia of critical illness - No evidence of active bleeding currently Plan Trend CBC Transfuse for hemoglobin less than 7 or for evidence of active bleeding   Hyperglycemia Plan Continue current insulin regimen  Vein Thromboses - DVT  in RUE brachial vein, SVT in LUE basilic and cephalic veins, DVT in LLE common femoral and femoral veins, negative RLE. Plan We will continue IV heparin for now Will talk to IR. Possibly IJ PICC catheter could be placed  Need to get LUE PICC out  DVT prophylaxis: heparin gtt SUP: H2B IV for now Diet: TF Activity: bedrest  Disposition : ICU  FAMILY  - Updates: Sister, Bethena Roys called and updated on events overnight 6/11.  She was shocked in that he had such a good, progressive day and is still processing information.  Will need to continue ongoing goals of care discussions, given this setback in his prolonged, complicated hospitalization.   Erick Colace ACNP-BC [place] Pager # [phone] OR # [phone] if no answer

## 2017-10-03 NOTE — Procedures (Signed)
Indications for procedure: VAC dressing on abdominal wound needs to be changed  Preoperative diagnosis VAC in place on abdominal wound  Postoperative diagnosis:  No evidence of fistula or enteric content leak  Procedure: Bedside VAC change 31x24x1.5cm in size  Surgeon:Aela Bohan [employer]  Assistant: JessicaFocht, PA-C  Procedure in detail:We did a time out. He received IV pain medicine. The old VAC sponge was removed. There was minimal fibrinous exudate around the edges of the fascia (less than seen earlier in the week, increase in fascial edge granulation tissue) but no evidence of enteric content leak or fistula. We fashioned a new VAC dressing using 2 large VAC sponges cut to shape to totally cover the area. 31x24x1.5cm. The VAC drape was  applied and we hooked up the back suction pump. Excellent seal was obtained. He tolerated the procedure well. He remained monitored in the intensive care unit throughout.     Henreitta Cea 2:48 PM 10/03/2017

## 2017-10-03 NOTE — Progress Notes (Signed)
  Echocardiogram 2D Echocardiogram has been performed.  Luke Kaufman 10/03/2017, 9:06 AM

## 2017-10-04 ENCOUNTER — Inpatient Hospital Stay (HOSPITAL_COMMUNITY): Payer: PPO

## 2017-10-04 HISTORY — PX: IR FLUORO GUIDE CV LINE RIGHT: IMG2283

## 2017-10-04 HISTORY — PX: IR US GUIDE VASC ACCESS RIGHT: IMG2390

## 2017-10-04 LAB — GLUCOSE, CAPILLARY
GLUCOSE-CAPILLARY: 101 mg/dL — AB (ref 65–99)
GLUCOSE-CAPILLARY: 121 mg/dL — AB (ref 65–99)
Glucose-Capillary: 92 mg/dL (ref 65–99)
Glucose-Capillary: 91 mg/dL (ref 65–99)
Glucose-Capillary: 107 mg/dL — ABNORMAL HIGH (ref 65–99)

## 2017-10-04 LAB — CBC
HEMATOCRIT: 32.5 % — AB (ref 39.0–52.0)
Hemoglobin: 9.3 g/dL — ABNORMAL LOW (ref 13.0–17.0)
MCH: 27.6 pg (ref 26.0–34.0)
MCHC: 28.6 g/dL — AB (ref 30.0–36.0)
MCV: 96.4 fL (ref 78.0–100.0)
PLATELETS: 268 10*3/uL (ref 150–400)
RBC: 3.37 MIL/uL — ABNORMAL LOW (ref 4.22–5.81)
RDW: 15.8 % — AB (ref 11.5–15.5)
WBC: 11.3 10*3/uL — AB (ref 4.0–10.5)

## 2017-10-04 LAB — BASIC METABOLIC PANEL
Anion gap: 8 (ref 5–15)
BUN: 26 mg/dL — AB (ref 6–20)
CALCIUM: 7.6 mg/dL — AB (ref 8.9–10.3)
CHLORIDE: 118 mmol/L — AB (ref 101–111)
CO2: 21 mmol/L — AB (ref 22–32)
CREATININE: 1.38 mg/dL — AB (ref 0.61–1.24)
GFR calc non Af Amer: 51 mL/min — ABNORMAL LOW (ref >60)
GFR, EST AFRICAN AMERICAN: 60 mL/min — AB (ref >60)
GLUCOSE: 148 mg/dL — AB (ref 65–99)
Potassium: 4.4 mmol/L (ref 3.5–5.1)
Sodium: 147 mmol/L — ABNORMAL HIGH (ref 135–145)

## 2017-10-04 LAB — POCT I-STAT 3, ART BLOOD GAS (G3+)
Acid-base deficit: 3 mmol/L — ABNORMAL HIGH (ref 0.0–2.0)
Bicarbonate: 20 mmol/L (ref 20.0–28.0)
O2 SAT: 98 %
PCO2 ART: 25.1 mmHg — AB (ref 32.0–48.0)
PH ART: 7.508 — AB (ref 7.350–7.450)
PO2 ART: 93 mmHg (ref 83.0–108.0)
TCO2: 21 mmol/L — AB (ref 22–32)

## 2017-10-04 LAB — HEPARIN LEVEL (UNFRACTIONATED): Heparin Unfractionated: 0.46 IU/mL (ref 0.30–0.70)

## 2017-10-04 MED ORDER — FENTANYL CITRATE (PF) 100 MCG/2ML IJ SOLN
INTRAMUSCULAR | Status: AC
  Filled 2017-10-04: qty 2

## 2017-10-04 MED ORDER — LIDOCAINE HCL 1 % IJ SOLN
INTRAMUSCULAR | Status: AC
  Filled 2017-10-04: qty 20

## 2017-10-04 MED ORDER — MIDAZOLAM HCL 2 MG/2ML IJ SOLN
INTRAMUSCULAR | Status: AC
  Filled 2017-10-04: qty 2

## 2017-10-04 MED ORDER — LIDOCAINE HCL 1 % IJ SOLN
INTRAMUSCULAR | Status: AC | PRN
  Administered 2017-10-04: 10 mL

## 2017-10-04 MED ORDER — "THROMBI-PAD 3""X3"" EX PADS"
1.0000 | MEDICATED_PAD | Freq: Once | CUTANEOUS | Status: AC
  Administered 2017-10-04: 1 via TOPICAL
  Filled 2017-10-04: qty 1

## 2017-10-04 MED ORDER — CEFAZOLIN SODIUM-DEXTROSE 1-4 GM/50ML-% IV SOLN
INTRAVENOUS | Status: AC | PRN
  Administered 2017-10-04: 2 g via INTRAVENOUS

## 2017-10-04 MED ORDER — FENTANYL CITRATE (PF) 100 MCG/2ML IJ SOLN
INTRAMUSCULAR | Status: AC | PRN
  Administered 2017-10-04 (×3): 50 ug via INTRAVENOUS

## 2017-10-04 MED ORDER — CEFAZOLIN SODIUM-DEXTROSE 2-4 GM/100ML-% IV SOLN
INTRAVENOUS | Status: AC
  Filled 2017-10-04: qty 100

## 2017-10-04 MED ORDER — MIDAZOLAM HCL 2 MG/2ML IJ SOLN
INTRAMUSCULAR | Status: AC
  Filled 2017-10-04: qty 4

## 2017-10-04 MED ORDER — LOPERAMIDE HCL 1 MG/7.5ML PO SUSP
4.0000 mg | Freq: Three times a day (TID) | ORAL | Status: DC
  Administered 2017-10-04 – 2017-10-06 (×7): 4 mg via ORAL
  Filled 2017-10-04 (×9): qty 30

## 2017-10-04 MED ORDER — FENTANYL CITRATE (PF) 100 MCG/2ML IJ SOLN
25.0000 ug | INTRAMUSCULAR | Status: DC | PRN
  Administered 2017-10-04 (×2): 25 ug via INTRAVENOUS
  Filled 2017-10-04 (×2): qty 2

## 2017-10-04 MED ORDER — MIDAZOLAM HCL 2 MG/2ML IJ SOLN
INTRAMUSCULAR | Status: AC | PRN
  Administered 2017-10-04 (×3): 1 mg via INTRAVENOUS

## 2017-10-04 NOTE — Progress Notes (Signed)
  Speech Language Pathology Treatment: [name] Speaking valve  Patient Details Name: Luke Kaufman MRN: [id number] DOB: Apr 12, 1951 Today's Date: 10/04/2017 Time: 1430-1440 SLP Time Calculation (min) (ACUTE ONLY): 10 min  Assessment / Plan / Recommendation Clinical Impression  Pt seen for skilled ST treatment to reassess toleration of PMSV. Pt downsized to [place] #6 cuffed trach yesterday. Secretions are thick, copious.Tolerated cuff deflation for period of 10 minutes, tracheal expectoration of secretions. Pt was alert, anxious, widening his eyes and shaking his head with brief placement of PMSV for 2 breath cycles. Respiratory rate increased to 33; did not attempt further trials. Anticipate improved toleration with decrease in secretions, anxiety. Continue PMSV trials with SLP only at this time. Will follow up.    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 Fascia remains open, agitation barrier to weaning. Trached 5/24., with return to OR 5/26, 5/29 now with wound vac.        SLP Plan  Continue with current plan of care       Recommendations         Patient may use Passy-Muir Speech Valve: with SLP only PMSV Supervision: Full MD: Please consider changing trach tube to : Cuffless         Oral Care Recommendations: Oral care QID Follow up Recommendations: Skilled Nursing facility SLP Visit Diagnosis: Aphonia (R49.1) Plan: Continue with current plan of care       [place], [place], [place] Speech-Language Pathologist [place] 10/04/2017, 2:50 PM

## 2017-10-04 NOTE — Progress Notes (Signed)
LUA PICC D/C'ed per order.  Site CDI, cleaned with CHG, covered with vaseline guaze and dry 2x2.  Pressure applied to site  X 5-10 minutes.  No bleeding or signs of infection noted. Josh RN aware.

## 2017-10-04 NOTE — Progress Notes (Signed)
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 Fascia remains open, agitation barrier to weaning. Trached 5/24., to return to OR 5/26, 5/29 now with wound vac   STUDIES:  CT ab/pelvis 5/15 > portal venous gas in liver, sigmoid colon with diverticular disease, atrophic left kidney CT head 6/12 > chronic ischemia, right maxillary sinus fluid level and bilateral mastoid and middle ear effusions. CTA chest 6/12 > LLL PE (RV / LV 0.89). Echo 6/12 > EF 40-45%, ? Ischemia or infarction in distribution of LAD given severe hypokinesis. UE duplex 6/12 > DVT in right brachial vein, SVT in left basilic and cephalic veins. LE dupled 6/12 > DVT in left CFV and FV.  Negative on right.  CULTURES: 5/17 resp > e coli resistant to multiple antibiotics 5/17 blood > negative  ANTIBIOTICS: 5/15 zosyn > 5/19  5/19 ceftriaxone > 5/23   LINES/TUBES: 5/16 R [place] CVL > 5/24 5/16 ETT > 5/24 5/24 Trach > 6/10 prox XLT placed >>> 5/18 R femoral arterial line > 5/24 5/24 LUA PICC  SIGNIFICANT EVENTS: 5/16 mesenteric angiogram and aorto to SMA bypass 5/16 ex lap R hemicolectomy 5/18 ileostomy 5/19 abdominal washout and partial fascial closure 5/21 TF started, hypoxia with turning  5/22 Return to OR for exploration, attempted closure > unable 5/24 Trach 5/26 OR for debridement and dressing change 5/29 Exploratory laparotomy, placement of ABBRA closure system 5/31 - No events overnight. Plans to return to OR for dressing change  6/11- tolerating SBT/ off sedation; Vfib arrest that evening 6/12 VAC changed 6/14 Down size trach to 6  cuffed  SUBJECTIVE/OVERNIGHT/INTERVAL HX:  No acute events overnight. Back from IR after tunnel catheter placement.  VITAL SIGNS: BP (!) 151/74   Pulse 90   Temp 98.3 F (36.8 C) (Oral)   Resp (!) 26   Ht 5\' 7"  (1.702 m)   Wt 112.7 kg (248 lb 7.3 oz)   SpO2 99%   BMI 38.91 kg/m   HEMODYNAMICS: CVP:  [0 mmHg-12 mmHg] 0 mmHg  VENTILATOR SETTINGS: Vent Mode: PRVC FiO2 (%):  [28 %-100 %] 100 % Set Rate:  [24 bmp] 24 bmp Vt Set:  [530 mL] 530 mL PEEP:  [5 cmH20] 5 cmH20 Plateau Pressure:  [10 cmH20] 10 cmH20  INTAKE / OUTPUT:  Intake/Output Summary (Last 24 hours) at 10/04/2017 0929 Last data filed at 10/04/2017 0600 Gross per 24 hour  Intake 3133.75 ml  Output 3300 ml  Net -166.25 ml    Physical exam  Blood pressure (!) 151/74, pulse 90, temperature 98.3 F (36.8 C), temperature source Oral, resp. rate (!) 26, height 5\' 7"  (1.702 m), weight 112.7 kg (248 lb 7.3 oz), SpO2 99 %. Gen:      No acute distress HEENT:  EOMI, sclera anicteric Neck:     No masses; no thyromegaly, trach collar Lungs:    Clear to auscultation bilaterally; normal respiratory effort CV:         Regular rate and rhythm; no murmurs Abd:      + bowel sounds; soft, non-tender; no palpable masses, no distension Ext:    Diffuse anasarca; adequate peripheral perfusion  Skin:      Warm and dry; no rash Neuro: Awake, nonpurposeful movements.   PULMONARY Recent Labs  Lab 09/28/17 0359 09/30/17 2157 09/30/17 2250 10/01/17 0459  PHART 7.465* 7.181* 7.419 7.458*  PCO2ART 27.6* 44.1 32.2 28.1*  PO2ART 102 82.0* 355* 127.0*  HCO3 19.5* 16.3* 20.2 19.8*  TCO2  --  18*  --  21*  O2SAT 97.6 92.0 99.6 99.0    CBC Recent Labs  Lab 10/02/17 0515 10/03/17 0441 10/04/17 0223  HGB 7.9* 8.1* 9.3*  HCT 25.9* 26.6* 32.5*  WBC 8.9 9.4 11.3*  PLT 273 273 268    COAGULATION No results for input(s): INR in the last 168 hours.  CARDIAC   Recent Labs  Lab 09/30/17 2221 10/01/17 0449 10/01/17 0947   TROPONINI 0.06* 1.36* 1.74*   No results for input(s): PROBNP in the last 168 hours.   CHEMISTRY Recent Labs  Lab 09/30/17 1134  10/01/17 0028 10/01/17 0449 10/02/17 0515 10/03/17 0441 10/03/17 1954 10/04/17 0223  NA 149*   < >  --  150* 150* 145 148* 147*  K 3.5   < >  --  3.3* 3.4* 3.6 4.3 4.4  CL 120*   < >  --  119* 118* 119* 118* 118*  CO2 22   < >  --  22 22 20* 19* 21*  GLUCOSE 122*   < >  --  117* 134* 127* 137* 148*  BUN 40*   < >  --  39* 33* 27* 26* 26*  CREATININE 1.36*   < >  --  1.57* 1.39* 1.36* 1.40* 1.38*  CALCIUM 7.9*   < >  --  7.6* 7.5* 7.2* 7.5* 7.6*  MG 2.5*  --  2.3 2.1 2.3 2.0  --   --   PHOS 3.2  --   --   --  2.8 2.0*  --   --    < > = values in this interval not displayed.   Estimated Creatinine Clearance: 62.2 mL/min (A) (by C-G formula based on SCr of 1.38 mg/dL (H)).   LIVER Recent Labs  Lab 09/28/17 0359 09/30/17 1134 09/30/17 2220 10/01/17 0449  AST 56* 28 54* 61*  ALT 38 24 30 37  ALKPHOS 107 81 117 109  BILITOT 0.5 0.5 0.7 0.9  PROT 5.3* 5.9* 5.7* 5.4*  ALBUMIN 1.3* 2.4* 2.8* 2.6*     INFECTIOUS Recent Labs  Lab 09/30/17 2214 09/30/17 2221 10/01/17 0220 10/01/17 0449 10/02/17 0515  LATICACIDVEN 6.0*  --  1.2  --   --   PROCALCITON  --  0.23  --  1.47 1.80     ENDOCRINE CBG (last 3)  Recent Labs    10/03/17 1930 10/03/17 2329 10/04/17 0406  GLUCAP 129* 107* 121*     IMAGING x48h  - image(s) personally visualized  -   highlighted in bold No results found. 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  Most recently had mesh placed.  He is been on IV sedation for a prolonged period of time and is at a very high risk for prolonged encephalopathy.  Progressive day weaning on MV and off sedation 6/11.  Subsequent Vfib arrest 6/11 & possible  acute CVA w/ left sided hemiparesis.   ASSESSMENT / PLAN:   Ischemic bowel, s/p resection with hemicolectomy and ileostomy;  now status post several washouts.  Severe protein calorie malnutrition Plan Resume tube feeds.  Wound care per surgery.  Acute respiratory failure with hypoxemia with acute pulmonary edema  Status post tracheostomy 5/24 - transitioned down to ATC on 6/12 and tolerating well LLE PE H/O COPD Plan: Placed on vent for procedure Transition back to trach mask. Continue bronchodilators Heparin drip for PE Start PMV trials   Vfib arrest 6/11 - unclear etiology at this point- concern for embolic event, given hx, no significant electrolytes abnormalities on labs, EKG non-acute but new Afib Non-ST elevation MI- echo with severe hypokinesis and significantly reduced EF (40-45%)  New onset Afib 6/11 Shock -6/11 - resolved History of coronary artery disease, prior MI, hypertension and peripheral vascular disease Plan Cardiology following Continue metoprolol.  Will need left heart cath once medically stable Add aspirin if okay with surgery.  Acute kidney injury--> improving Plan Urine output, creatinine  Fluid and electrolyte imbalance: Hypernatremia/hyperchloremia-> improved Hypocalcemia: pseudo Plan: Free water Follow metabolic panel  Acute encephalopathy w/ hypoactive delirium Left sided weakness- ? More pronounced after cardiac arrest overnight 6/11.  CT head negative. Plan RASS goal 0 Continue Seroquel 50 mg nightly Follow QTC  Anemia of critical illness - No evidence of active bleeding currently Plan Follow CBC.   Hyperglycemia Plan SSI coverage  Vein Thromboses - DVT in RUE brachial vein, SVT in LUE basilic and cephalic veins, DVT in LLE common femoral and femoral veins, negative RLE. Plan Heparin drip.  DVT prophylaxis: heparin gtt SUP: H2B IV for now Diet: TF Activity: bedrest  Disposition : ICU  FAMILY  - Updates: Sister, Bethena Roys  called and updated on events overnight 6/11.  No family at bedside 6/15.  The patient is critically ill with multiple organ system failure and requires high complexity decision making for assessment and support, frequent evaluation and titration of therapies, advanced monitoring, review of radiographic studies and interpretation of complex data.   Critical Care Time devoted to patient care services, exclusive of separately billable procedures, described in this note is 35 minutes.   Marshell Garfinkel MD [facility] 10/04/2017, 9:41 AM

## 2017-10-04 NOTE — Progress Notes (Signed)
Patient's A-line was malpositioned and consequently came out. A-line needed for BP monitoring, since we are unable to use cuffs d/t DVTs, Pes. Elink aware. Respiratory aware of need for a new A-line.

## 2017-10-04 NOTE — Progress Notes (Signed)
ANTICOAGULATION CONSULT NOTE Pharmacy Consult for Heparin Indication: pulmonary embolus  No Known Allergies  Patient Measurements: Height: 5\' 7"  (170.2 cm) Weight: 248 lb 7.3 oz (112.7 kg) IBW/kg (Calculated) : 66.1 Heparin Dosing Weight: 90 kg  Vital Signs: Temp: 98.3 F (36.8 C) (06/15 0732) Temp Source: Oral (06/15 0732) BP: 151/74 (06/15 0901) Pulse Rate: 90 (06/15 0901)  Labs: Recent Labs    10/01/17 0947  10/02/17 0515  10/03/17 0441  10/03/17 1348 10/03/17 1954 10/04/17 0223  HGB  --    < > 7.9*  --  8.1*  --   --   --  9.3*  HCT  --   --  25.9*  --  26.6*  --   --   --  32.5*  PLT  --   --  273  --  273  --   --   --  268  HEPARINUNFRC 1.38*   < > 0.25*   < >  --    < > 0.26* 0.44 0.46  CREATININE  --    < > 1.39*  --  1.36*  --   --  1.40* 1.38*  TROPONINI 1.74*  --   --   --   --   --   --   --   --    < > = values in this interval not displayed.    Estimated Creatinine Clearance: 62.2 mL/min (A) (by C-G formula based on SCr of 1.38 mg/dL (H)).   Assessment: 67 y.o. male admitted 5/16 s/p multiple abdominal surgeries and tracheostomy, now s/p VFib arrest and found to have PE on IV heparin. Heparin currently therapeutic this morning, CBC stable.  Goal of Therapy:  Heparin level 0.3-0.7 units/ml Monitor platelets by anticoagulation protocol: Yes   Plan:  Continue Heparin to 2600 units/hr Daily heparin level and CBC  Arrie Senate, PharmD, BCPS PGY-2 Cardiology Pharmacy Resident Phone: [phone] 10/04/2017

## 2017-10-04 NOTE — Progress Notes (Signed)
[facility]/Trauma Progress Note  11 Days Post-Op   Assessment/Plan Principal Problem: Mesenteric ischemia (HCC) Active Problems: Essential hypertension Arterial atherosclerosis HLD (hyperlipidemia) Abdominal pain PUD (peptic ulcer disease) Occlusion of superior mesenteric artery (HCC) Respiratory failure (HCC) Acute respiratory failure with hypoxemia (HCC) AKI (acute kidney injury) ([place])  Mesenteric ischemia 1.Exploratory laparotomy right hemicolectomy 09/04/2017 Dr. Stark Klein 2.Abdominal exploration, application of temporary abdominal closure device(ABTHERA), 09/04/2017 Dr. Nadeen Landau 3.Oratory laparotomy, ileostomy, application of open abdominal wound VAC, 09/06/2017 Dr. Georganna Skeans 4. Open abdomen for attempted closure, 09/08/2017 Dr. Judeth Horn 5. Oratory laparotomy with possible wound closure(prior sutures upper&lower portion of the fascia had broken), 09/10/2017 Dr. Judeth Horn 6. Reexploration open abdominal wound, application ofABTHERAWOUND VAC,09/14/2017 Dr. Greer Pickerel 7. Exploratory laparotomy and partial closure of abdomen using the elastic tension closure device (ABRA), Dr. Hulen Skains, 05/29 8. exploratory laparotomy and dressing change, Dr. Ninfa Linden, 05/31 9. Changed negative pressure dressing, Dr. Kieth Brightly, 06/03 10.reopening of recent laparotomy, incisional hernia repair, insertion of mesh, placement of negative pressure dressing, Dr. Kieth Brightly, 06/04 11. Bedside vac change, Dr. Kieth Brightly, 06/07  - no fevers, WBC slightly up to 11.3 - extubated on trach collar, CCM changed to a 6 cuff - PE with brief cardiac arrest now on heparin, cards following - L sided weakness CCM considering MRI, not present on admission. CCM spoke to sister  - tolerating TF's, stool output down some with immodium but still high so increased to TID  - IR placing PICC today  TIR:WERX feeds VTE: SCD's, heparin VQ:MGQQ  currently Foley:yes Follow up:TBD  DISPO:vac changeMonday at bedside by surgery team.    LOS: 30 days    Subjective: CC: open abdomen  Nurse states pt appears more agitated with adjusting pt. She is concerned he is in pain. When I asked the pt if he is in pain he shook his head no. He did not grimace when I pressed on his abdomen. He is awake on trach collar. Follows some commands. Smiled at me this am. No family at bedside.   Objective: Vital signs in last 24 hours: Temp:  [97.8 F (36.6 C)-99.9 F (37.7 C)] 98.3 F (36.8 C) (06/15 0732) Pulse Rate:  [94-113] 95 (06/15 0403) Resp:  [19-34] 21 (06/15 0725) SpO2:  [95 %-99 %] 99 % (06/15 0725) Arterial Line BP: (128-190)/(45-104) 167/63 (06/15 0600) FiO2 (%):  [28 %-40 %] 40 % (06/15 0725) Weight:  [112.7 kg (248 lb 7.3 oz)] 112.7 kg (248 lb 7.3 oz) (06/15 0500) Last BM Date: 10/03/17  Intake/Output from previous day: 06/14 0701 - 06/15 0700 In: 3133.8 [I.V.:908.8; NG/GT:2225] Out: 3300 [Urine:850; Drains:550; Stool:1900] Intake/Output this shift: No intake/output data recorded.  PE: Gen:On trach collar, awake Card:tachycardia Pulm:placed back on vent for IR to place PICC PYP:PJKDT vacwith tan output, no air leak.Good BS. Tansemisolidstool in ostomy bag Neuro: following some commands Skin: warm and dry  Anti-infectives: Anti-infectives (From admission, onward)   Start     Dose/Rate Route Frequency Ordered Stop   09/19/17 0730  ceFAZolin (ANCEF) IVPB 2g/100 mL premix     2 g 200 mL/hr over 30 Minutes Intravenous On call to O.R. 09/19/17 0715 09/19/17 1057   09/17/17 1145  ceFAZolin (ANCEF) 3 g in dextrose 5 % 50 mL IVPB     3 g 100 mL/hr over 30 Minutes Intravenous To ShortStay Surgical 09/17/17 0923 09/17/17 1449   09/07/17 1545  cefTRIAXone (ROCEPHIN) 2 g in sodium chloride 0.9 % 100 mL IVPB     2  g 200 mL/hr over 30 Minutes Intravenous Every 24 hours 09/07/17 1543 09/11/17 1605   09/04/17 1400   piperacillin-tazobactam (ZOSYN) IVPB 3.375 g  Status:  Discontinued     3.375 g 12.5 mL/hr over 240 Minutes Intravenous Every 8 hours 09/04/17 1157 09/07/17 1541   09/04/17 1100  ceFAZolin (ANCEF) IVPB 2g/100 mL premix  Status:  Discontinued     2 g 200 mL/hr over 30 Minutes Intravenous Every 8 hours 09/04/17 1035 09/04/17 1157   09/04/17 0630  piperacillin-tazobactam (ZOSYN) IVPB 3.375 g  Status:  Discontinued     3.375 g 100 mL/hr over 30 Minutes Intravenous  Once 09/04/17 0617 09/04/17 0626   09/04/17 0619  piperacillin-tazobactam (ZOSYN) IVPB 3.375 g     3.375 g 100 mL/hr over 30 Minutes Intravenous 60 min pre-op 09/04/17 0620 09/04/17 0717   09/04/17 0030  piperacillin-tazobactam (ZOSYN) IVPB 3.375 g     3.375 g 100 mL/hr over 30 Minutes Intravenous  Once 09/04/17 0024 09/04/17 0144      Lab Results:  Recent Labs    10/03/17 0441 10/04/17 0223  WBC 9.4 11.3*  HGB 8.1* 9.3*  HCT 26.6* 32.5*  PLT 273 268   BMET Recent Labs    10/03/17 1954 10/04/17 0223  NA 148* 147*  K 4.3 4.4  CL 118* 118*  CO2 19* 21*  GLUCOSE 137* 148*  BUN 26* 26*  CREATININE 1.40* 1.38*  CALCIUM 7.5* 7.6*   PT/INR No results for input(s): LABPROT, INR in the last 72 hours. CMP     Component Value Date/Time   NA 147 (H) 10/04/2017 0223   K 4.4 10/04/2017 0223   CL 118 (H) 10/04/2017 0223   CO2 21 (L) 10/04/2017 0223   GLUCOSE 148 (H) 10/04/2017 0223   BUN 26 (H) 10/04/2017 0223   CREATININE 1.38 (H) 10/04/2017 0223   CALCIUM 7.6 (L) 10/04/2017 0223   PROT 5.4 (L) 10/01/2017 0449   ALBUMIN 2.6 (L) 10/01/2017 0449   AST 61 (H) 10/01/2017 0449   ALT 37 10/01/2017 0449   ALKPHOS 109 10/01/2017 0449   BILITOT 0.9 10/01/2017 0449   GFRNONAA 51 (L) 10/04/2017 0223   GFRAA 60 (L) 10/04/2017 0223   Lipase     Component Value Date/Time   LIPASE 37 09/03/2017 1644    Studies/Results: No results found.    Kalman Drape , [facility] 10/04/2017, 7:55  AM  Pager: [phone] Mon-Wed, Friday 7:00am-4:30pm Thurs 7am-11:30am  Consults: [phone]

## 2017-10-04 NOTE — Progress Notes (Signed)
Per Elink MD, RT can re-attempt A-line in either upper extremity even with current DVTs/PEs, as long as the Allen's test is OK.

## 2017-10-04 NOTE — Progress Notes (Signed)
RT note-Patient found with trach cuff deflated, moderate amount secretions thick tan. Continue to monitor.

## 2017-10-04 NOTE — Progress Notes (Signed)
Progress Note  Patient Name: Luke Kaufman Date of Encounter: 10/04/2017  Primary Cardiologist: Fransico Him, MD   Subjective   Unable to assess.  On ventilator.  Inpatient Medications    Scheduled Meds: . acetaminophen  650 mg Oral Q6H  . atorvastatin  80 mg Per Tube q1800  . chlorhexidine gluconate (MEDLINE KIT)  15 mL Mouth Rinse BID  . Chlorhexidine Gluconate Cloth  6 each Topical Daily  . feeding supplement (PRO-STAT SUGAR FREE 64)  60 mL Per Tube BID  . free water  300 mL Per Tube Q6H  . HYDROmorphone  1 mg Oral Q4H  . insulin aspart  0-15 Units Subcutaneous Q4H  . lidocaine      . loperamide HCl  4 mg Oral TID  . mouth rinse  15 mL Mouth Rinse 10 times per day  . metoprolol tartrate  5 mg Intravenous Q6H  . sodium chloride flush  10-40 mL Intracatheter Q12H   Continuous Infusions: . sodium chloride 10 mL/hr at 10/03/17 1800  . sodium chloride    . famotidine (PEPCID) IV Stopped (10/04/17 0034)  . feeding supplement (OSMOLITE 1.2 CAL) 65 mL/hr at 10/03/17 1800  . heparin 2,600 Units/hr (10/04/17 0105)   PRN Meds: fentaNYL (SUBLIMAZE) injection, HYDROmorphone, ipratropium-albuterol, midazolam, pneumococcal 23 valent vaccine, sodium chloride flush   Vital Signs    Vitals:   10/04/17 0500 10/04/17 0600 10/04/17 0725 10/04/17 0732  BP:      Pulse:      Resp: (!) 24 (!) 30 (!) 21   Temp:    98.3 F (36.8 C)  TempSrc:    Oral  SpO2: 96% 97% 99%   Weight: 248 lb 7.3 oz (112.7 kg)     Height:        Intake/Output Summary (Last 24 hours) at 10/04/2017 0828 Last data filed at 10/04/2017 0600 Gross per 24 hour  Intake 3133.75 ml  Output 3300 ml  Net -166.25 ml   Filed Weights   10/02/17 0530 10/03/17 0500 10/04/17 0500  Weight: 250 lb 14.1 oz (113.8 kg) 248 lb 3.8 oz (112.6 kg) 248 lb 7.3 oz (112.7 kg)    Telemetry    Sinus rhythm.  Atrial tachycardia.  PVCs. - Personally Reviewed  ECG    n/a - Personally Reviewed  Physical Exam   VS:  BP (!)  140/56   Pulse 95   Temp 98.3 F (36.8 C) (Oral)   Resp (!) 21   Ht '5\' 7"'  (1.702 m)   Wt 248 lb 7.3 oz (112.7 kg)   SpO2 99%   BMI 38.91 kg/m  , BMI Body mass index is 38.91 kg/m. GENERAL:  Ill-appearing.  No acute distress.  HEENT: Pupils equal round and reactive, fundi not visualized, oral mucosa unremarkable NECK:  No jugular venous distention, waveform within normal limits, carotid upstroke brisk and symmetric, no bruits, no thyromegaly LYMPHATICS:  No cervical adenopathy LUNGS:  Clear to auscultation bilaterally HEART:  RRR.  PMI not displaced or sustained,S1 and S2 within normal limits, no S3, no S4, no clicks, no rubs, no murmurs ABD:  Open abdomen EXT:  2 plus pulses throughout, no edema, no cyanosis no clubbing SKIN:  No rashes no nodules NEURO:  Unable to assess PSYCH: [employer] Recent Labs  Lab 09/30/17 1134 09/30/17 2220 10/01/17 0449  10/03/17 0441 10/03/17 1954 10/04/17 0223  NA 149* 149* 150*   < > 145 148* 147*  K 3.5 3.9  3.3*   < > 3.6 4.3 4.4  CL 120* 116* 119*   < > 119* 118* 118*  CO2 22 17* 22   < > 20* 19* 21*  GLUCOSE 122* 159* 117*   < > 127* 137* 148*  BUN 40* 37* 39*   < > 27* 26* 26*  CREATININE 1.36* 1.53* 1.57*   < > 1.36* 1.40* 1.38*  CALCIUM 7.9* 7.9* 7.6*   < > 7.2* 7.5* 7.6*  PROT 5.9* 5.7* 5.4*  --   --   --   --   ALBUMIN 2.4* 2.8* 2.6*  --   --   --   --   AST 28 54* 61*  --   --   --   --   ALT 24 30 37  --   --   --   --   ALKPHOS 81 117 109  --   --   --   --   BILITOT 0.5 0.7 0.9  --   --   --   --   GFRNONAA 52* 45* 44*   < > 52* 50* 51*  GFRAA >60 53* 51*   < > >60 59* 60*  ANIONGAP 7 16* 9   < > '6 11 8   ' < > = values in this interval not displayed.     Hematology Recent Labs  Lab 10/02/17 0515 10/03/17 0441 10/04/17 0223  WBC 8.9 9.4 11.3*  RBC 2.83* 2.93* 3.37*  HGB 7.9* 8.1* 9.3*  HCT 25.9* 26.6* 32.5*  MCV 91.5 90.8 96.4  MCH 27.9 27.6 27.6  MCHC 30.5 30.5 28.6*  RDW 15.4 15.3  15.8*  PLT 273 273 268    Cardiac Enzymes Recent Labs  Lab 09/30/17 2221 10/01/17 0449 10/01/17 0947  TROPONINI 0.06* 1.36* 1.74*   No results for input(s): TROPIPOC in the last 168 hours.   BNPNo results for input(s): BNP, PROBNP in the last 168 hours.   DDimer No results for input(s): DDIMER in the last 168 hours.   Radiology    No results found.  Cardiac Studies   Echo 10/01/17: Study Conclusions  - Left ventricle: Systolic function was mildly to moderately   reduced. The estimated ejection fraction was in the range of 40%   to 45%. Severe hypokinesis of the anteroseptal and anterior   myocardium; consistent with ischemia or infarction in the   distribution of the left anterior descending coronary artery.   However, the apex appears to be spared. Due to tachycardia, there   was fusion of early and atrial contributions to ventricular   filling. The study is not technically sufficient to allow   evaluation of LV diastolic function. - Mitral valve: Calcified annulus.  Impressions:  - Very technically challenging study. Suggest an alternative   imaging technique such as CT or MRI.  Echo 10/03/17: Study Conclusions  - Left ventricle: The cavity size was normal. Wall thickness was   normal. Systolic function was normal. The estimated ejection   fraction was in the range of 60% to 65%. Wall motion was normal;   there were no regional wall motion abnormalities. The study is   not technically sufficient to allow evaluation of LV diastolic   function.  Patient Profile     67 y.o. male with CAD status post medically managed MI in 2015, hypertension, hyperlipidemia, and ongoing tobacco abuse admitted 5/15 with severe abdominal pain.  CT scan showed portal venous gas.  He underwent mesenteric angiography that showed  an ischemic right colon.  He underwent emergent exploratory laparotomy with right hemicolectomy and ileostomy.  Since that time he is undergone several  exploratory laparotomies with failed attempts at closure of his abdomen.  He had failure to wean off the ventilator and underwent tracheostomy on 09/12/2017.  On 09/30/2017 he suffered VF arrest.  Assessment & Plan    # VF arrest:  Echo post arrest initially revealed LVEF 40 to 45% but increased to 60 to 65% on 10/03/2017.  He will need left heart catheterization once medically stable.  Continue metoprolol 51m IV q6h.  # NSTEMI: # CAD:  Prior MI in 2015 without PCI.  No records available.  [facility] was consulted in 2016 for mildly elevated troponin.  He has not followed with cardiology since that time.  Troponin has been mildly elevated throughout the hospitalization, initially 0.5 to 0.29 and thought to be due to demand ischemia.  Increased to 1.36 s/p cardiac arrest and uptrended to 1.74.    He will need an ischemic work-up once more medically stable.  Continue heparin and atorvastatin.  Would add aspirin if OK with surgery.  # Hypertension: Continue metoprolol 53mIV q6h.  # Hyperlipidemia: Home statin increased to atorvastatin 8058mhis admission.  # Sinus tachycardia: # Frequent PACs: There was report of atrial fibrillation but it appears to be atrial tachycardia and frequent PACs.  Continue metoprolol.     Time spent: 45 minutes-Greater than 50% of this time was spent in counseling, explanation of diagnosis, planning of further management, and coordination of care.   For questions or updates, please contact [email or web address] consult [email or web address] for contact info under Cardiology/STEMI.      Signed, TifSkeet LatchD  10/04/2017, 8:28 AM

## 2017-10-04 NOTE — Progress Notes (Signed)
Placed patient on vent due to impending trip to IR.

## 2017-10-04 NOTE — Progress Notes (Signed)
Pt transported to IR on vent, no complications. 

## 2017-10-05 ENCOUNTER — Encounter (HOSPITAL_COMMUNITY): Payer: Self-pay | Admitting: Interventional Radiology

## 2017-10-05 ENCOUNTER — Inpatient Hospital Stay (HOSPITAL_COMMUNITY): Payer: PPO

## 2017-10-05 LAB — BASIC METABOLIC PANEL
ANION GAP: 7 (ref 5–15)
BUN: 21 mg/dL — ABNORMAL HIGH (ref 6–20)
CALCIUM: 7.1 mg/dL — AB (ref 8.9–10.3)
CO2: 20 mmol/L — AB (ref 22–32)
CREATININE: 1.27 mg/dL — AB (ref 0.61–1.24)
Chloride: 114 mmol/L — ABNORMAL HIGH (ref 101–111)
GFR, EST NON AFRICAN AMERICAN: 57 mL/min — AB (ref >60)
GLUCOSE: 119 mg/dL — AB (ref 65–99)
Potassium: 3.8 mmol/L (ref 3.5–5.1)
Sodium: 141 mmol/L (ref 135–145)

## 2017-10-05 LAB — CBC
HCT: 27.7 % — ABNORMAL LOW (ref 39.0–52.0)
Hemoglobin: 8.2 g/dL — ABNORMAL LOW (ref 13.0–17.0)
MCH: 27.7 pg (ref 26.0–34.0)
MCHC: 29.6 g/dL — ABNORMAL LOW (ref 30.0–36.0)
MCV: 93.6 fL (ref 78.0–100.0)
PLATELETS: 192 10*3/uL (ref 150–400)
RBC: 2.96 MIL/uL — AB (ref 4.22–5.81)
RDW: 15.8 % — AB (ref 11.5–15.5)
WBC: 8.2 10*3/uL (ref 4.0–10.5)

## 2017-10-05 LAB — GLUCOSE, CAPILLARY
Glucose-Capillary: 113 mg/dL — ABNORMAL HIGH (ref 65–99)
Glucose-Capillary: 113 mg/dL — ABNORMAL HIGH (ref 65–99)
Glucose-Capillary: 116 mg/dL — ABNORMAL HIGH (ref 65–99)
Glucose-Capillary: 92 mg/dL (ref 65–99)

## 2017-10-05 LAB — MAGNESIUM: Magnesium: 1.9 mg/dL (ref 1.7–2.4)

## 2017-10-05 LAB — PHOSPHORUS: Phosphorus: 2.9 mg/dL (ref 2.5–4.6)

## 2017-10-05 LAB — HEPARIN LEVEL (UNFRACTIONATED): HEPARIN UNFRACTIONATED: 0.49 [IU]/mL (ref 0.30–0.70)

## 2017-10-05 MED ORDER — ASPIRIN 81 MG PO CHEW
81.0000 mg | CHEWABLE_TABLET | Freq: Every day | ORAL | Status: DC
  Administered 2017-10-05 – 2017-10-08 (×4): 81 mg
  Filled 2017-10-05 (×4): qty 1

## 2017-10-05 MED ORDER — FREE WATER
300.0000 mL | Freq: Four times a day (QID) | Status: DC
  Administered 2017-10-05 – 2017-10-07 (×9): 300 mL

## 2017-10-05 MED ORDER — FENTANYL CITRATE (PF) 100 MCG/2ML IJ SOLN
50.0000 ug | INTRAMUSCULAR | Status: DC | PRN
  Administered 2017-10-05 – 2017-10-09 (×8): 50 ug via INTRAVENOUS
  Filled 2017-10-05 (×8): qty 2

## 2017-10-05 MED ORDER — CHLORHEXIDINE GLUCONATE 0.12 % MT SOLN
15.0000 mL | Freq: Two times a day (BID) | OROMUCOSAL | Status: DC
  Administered 2017-10-05 – 2017-10-09 (×9): 15 mL via OROMUCOSAL
  Filled 2017-10-05 (×9): qty 15

## 2017-10-05 MED ORDER — ORAL CARE MOUTH RINSE
15.0000 mL | Freq: Two times a day (BID) | OROMUCOSAL | Status: DC
  Administered 2017-10-05 – 2017-10-08 (×7): 15 mL via OROMUCOSAL

## 2017-10-05 MED ORDER — METOPROLOL TARTRATE 25 MG PO TABS
25.0000 mg | ORAL_TABLET | Freq: Two times a day (BID) | ORAL | Status: DC
  Administered 2017-10-05 – 2017-10-08 (×7): 25 mg
  Filled 2017-10-05 (×7): qty 1

## 2017-10-05 NOTE — Plan of Care (Signed)
Called report to [employer] on 6E. Discussed all PMH, current status, and current plan of care. Notified sister Bethena Roys) of transfer via telephone.

## 2017-10-05 NOTE — Progress Notes (Addendum)
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 Fascia remains open, agitation barrier to weaning. Trached 5/24., to return to OR 5/26, 5/29 now with mesh, wound vac   STUDIES:  CT ab/pelvis 5/15 > portal venous gas in liver, sigmoid colon with diverticular disease, atrophic left kidney CT head 6/12 > chronic ischemia, right maxillary sinus fluid level and bilateral mastoid and middle ear effusions. CTA chest 6/12 > LLL PE (RV / LV 0.89). Echo 6/12 > EF 40-45%, ? Ischemia or infarction in distribution of LAD given severe hypokinesis. UE duplex 6/12 > DVT in right brachial vein, SVT in left basilic and cephalic veins. LE dupled 6/12 > DVT in left CFV and FV.  Negative on right.  CULTURES: 5/17 resp > e coli resistant to multiple antibiotics 5/17 blood > negative  ANTIBIOTICS: 5/15 zosyn > 5/19  5/19 ceftriaxone > 5/23   LINES/TUBES: 5/16 R [place] CVL > 5/24 5/16 ETT > 5/24 5/24 Trach > 6/10 prox XLT placed >>> 6/14 downsized to #6 5/18 R femoral arterial line > 5/24 5/24 LUA PICC>> 6/15 6/14 Tunneled rt [place] PICC >>  SIGNIFICANT EVENTS: 5/16 mesenteric angiogram and aorto to SMA bypass 5/16 ex lap R hemicolectomy 5/18 ileostomy 5/19 abdominal washout and partial fascial closure 5/21 TF started, hypoxia with turning  5/22 Return to OR for exploration, attempted closure > unable 5/24 Trach 5/26 OR for debridement and dressing change 5/29 Exploratory laparotomy, placement of ABBRA closure system 5/31 - No events overnight. Plans to return to OR for dressing change  6/11- tolerating SBT/ off sedation; Vfib arrest that  evening 6/12 VAC changed 6/14 Down size trach to 6 cuffed  SUBJECTIVE/OVERNIGHT/INTERVAL HX:  Tolerating trach mask Removed yesterday due to malpositioning.  No other acute events overnight.  VITAL SIGNS: BP (!) 151/74   Pulse 98   Temp 98.8 F (37.1 C) (Axillary)   Resp 19   Ht 5\' 7"  (1.702 m)   Wt 112.4 kg (247 lb 12.8 oz)   SpO2 99%   BMI 38.81 kg/m   HEMODYNAMICS:    VENTILATOR SETTINGS: FiO2 (%):  [28 %-100 %] 28 %  INTAKE / OUTPUT:  Intake/Output Summary (Last 24 hours) at 10/05/2017 0742 Last data filed at 10/05/2017 0700 Gross per 24 hour  Intake 3101.67 ml  Output 2425 ml  Net 676.67 ml    Physical exam  Gen:      No acute distress HEENT:  EOMI, sclera anicteric, trach collar Neck:     No masses; no thyromegaly Lungs:    Rhonchorous breath sounds CV:         Regular rate and rhythm; no murmurs Abd:      + bowel sounds; soft, non-tender; no palpable masses, no distension Ext:    No edema; adequate peripheral perfusion Skin:      Warm and dry; no rash Neuro: Awake. Smiles and responds to questions.   PULMONARY Recent Labs  Lab 09/30/17 2157 09/30/17 2250 10/01/17 0459 10/03/17 1504  PHART 7.181* 7.419 7.458* 7.508*  PCO2ART 44.1 32.2 28.1* 25.1*  PO2ART 82.0* 355* 127.0* 93.0  HCO3 16.3* 20.2 19.8* 20.0  TCO2 18*  --  21* 21*  O2SAT 92.0 99.6 99.0 98.0    CBC Recent Labs  Lab 10/03/17 0441 10/04/17 0223 10/05/17 0712  HGB 8.1* 9.3* 8.2*  HCT 26.6* 32.5* 27.7*  WBC 9.4 11.3* 8.2  PLT 273 268 192    COAGULATION No results for input(s): INR in the last 168 hours.  CARDIAC   Recent Labs  Lab 09/30/17 2221 10/01/17 0449 10/01/17 0947  TROPONINI 0.06* 1.36* 1.74*   No results for input(s): PROBNP in the last 168 hours.   CHEMISTRY Recent Labs  Lab 09/30/17 1134  10/01/17 0028 10/01/17 0449 10/02/17 0515 10/03/17 0441 10/03/17 1954 10/04/17 0223  NA 149*   < >  --  150* 150* 145 148* 147*  K 3.5   < >  --  3.3* 3.4*  3.6 4.3 4.4  CL 120*   < >  --  119* 118* 119* 118* 118*  CO2 22   < >  --  22 22 20* 19* 21*  GLUCOSE 122*   < >  --  117* 134* 127* 137* 148*  BUN 40*   < >  --  39* 33* 27* 26* 26*  CREATININE 1.36*   < >  --  1.57* 1.39* 1.36* 1.40* 1.38*  CALCIUM 7.9*   < >  --  7.6* 7.5* 7.2* 7.5* 7.6*  MG 2.5*  --  2.3 2.1 2.3 2.0  --   --   PHOS 3.2  --   --   --  2.8 2.0*  --   --    < > = values in this interval not displayed.   Estimated Creatinine Clearance: 62.2 mL/min (A) (by C-G formula based on SCr of 1.38 mg/dL (H)).   LIVER Recent Labs  Lab 09/30/17 1134 09/30/17 2220 10/01/17 0449  AST 28 54* 61*  ALT 24 30 37  ALKPHOS 81 117 109  BILITOT 0.5 0.7 0.9  PROT 5.9* 5.7* 5.4*  ALBUMIN 2.4* 2.8* 2.6*     INFECTIOUS Recent Labs  Lab 09/30/17 2214 09/30/17 2221 10/01/17 0220 10/01/17 0449 10/02/17 0515  LATICACIDVEN 6.0*  --  1.2  --   --   PROCALCITON  --  0.23  --  1.47 1.80     ENDOCRINE CBG (last 3)  Recent Labs    10/04/17 2027 10/04/17 2347 10/05/17 0357  GLUCAP 107* 101* 113*     IMAGING x48h  - image(s) personally visualized  -   highlighted in bold Dg Abd 1 View  Result Date: 10/04/2017 CLINICAL DATA:  OG tube placement EXAM: ABDOMEN - 1 VIEW COMPARISON:  09/13/2017 FINDINGS: Feeding tube tip overlies the third portion of the duodenum. Overall decreased upper bowel gas. IMPRESSION: Esophageal tube tip overlies the third portion of the duodenum Electronically Signed   By: Donavan Foil M.D.   On: 10/04/2017 23:04   Dg Chest Port 1 View  Result Date: 10/05/2017 CLINICAL DATA:  Acute respiratory failure EXAM: PORTABLE CHEST 1 VIEW COMPARISON:  09/30/2017 FINDINGS: Right internal jugular PICC line has been placed with the tip in the upper right atrium. Endotracheal tube is unchanged. Feeding tube enters the stomach. The tip is not visualized. Mild cardiomegaly and vascular congestion. Bibasilar atelectasis. No overt edema or effusions. IMPRESSION:  Cardiomegaly with vascular congestion and bibasilar atelectasis. Electronically Signed   By: Rolm Baptise M.D.   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  Most recently had mesh placed.  Vfib arrest 6/11 & possible acute CVA w/ left sided hemiparesis.   ASSESSMENT / PLAN: Ischemic bowel, s/p resection with hemicolectomy and ileostomy; now status post several washouts.  Severe protein calorie malnutrition Plan Continue tube feeds.  Wound care per surgery.  Acute respiratory failure with hypoxemia with acute pulmonary edema  Status post tracheostomy 5/24 - transitioned down to ATC on 6/12 and tolerating well LLL PE, DVT H/O COPD Plan: Continue trach mask PMV trials as tolerated Continue bronchodilators Heparin drip for PE DVT  Vfib arrest 6/11 - unclear etiology at this point Non-ST elevation MI- echo with severe hypokinesis and significantly reduced EF (40-45%)  New onset Afib 6/11 Shock -6/11 - resolved History of coronary artery disease, prior MI, hypertension and peripheral vascular disease Plan Cardiology following Change metoprolol to PO.  Will need left heart cath once medically stable. Discussed with surgery. Ok to start aspirin No need to replace a line as we are getting good cuff reading on rt leg  Acute kidney injury--> improving Plan Follow urine output and creatinine  Fluid and electrolyte imbalance: Hypernatremia/hyperchloremia-> improved Hypocalcemia: pseudo Plan: Free water Follow metabolic panel  Acute encephalopathy w/ hypoactive delirium Left sided weakness- ? More pronounced after cardiac arrest overnight 6/11.  CT head negative. Plan RASS goal 0 Continue seroquel Follow QTC  Anemia of critical illness - No  evidence of active bleeding currently Plan Follow CBC.   Hyperglycemia Plan SSI coverage  Vein Thromboses - DVT in RUE brachial vein, SVT in LUE basilic and cephalic veins, DVT in LLE common femoral and femoral veins, negative RLE. Plan Heparin drip.  DVT prophylaxis: heparin gtt SUP: H2B IV for now Diet: TF Activity: bedrest  Disposition : ICU  FAMILY  - Updates: Sister, Bethena Roys called and updated 6/16 Stable for transfer to [facility] MD [facility] 10/05/2017, 7:42 AM

## 2017-10-05 NOTE — Progress Notes (Signed)
Progress Note  Patient Name: Luke Kaufman Date of Encounter: 10/05/2017  Primary Cardiologist: Fransico Him, MD   Subjective   Unable to assess.  Sleeping.   Inpatient Medications    Scheduled Meds: . acetaminophen  650 mg Oral Q6H  . aspirin  81 mg Per Tube Daily  . atorvastatin  80 mg Per Tube q1800  . chlorhexidine  15 mL Mouth Rinse BID  . Chlorhexidine Gluconate Cloth  6 each Topical Daily  . feeding supplement (PRO-STAT SUGAR FREE 64)  60 mL Per Tube BID  . free water  300 mL Per Tube Q6H  . HYDROmorphone  1 mg Oral Q4H  . insulin aspart  0-15 Units Subcutaneous Q4H  . loperamide HCl  4 mg Oral TID  . mouth rinse  15 mL Mouth Rinse q12n4p  . metoprolol tartrate  25 mg Per Tube BID  . sodium chloride flush  10-40 mL Intracatheter Q12H   Continuous Infusions: . sodium chloride 10 mL/hr at 10/04/17 1900  . sodium chloride    . famotidine (PEPCID) IV Stopped (10/04/17 2327)  . feeding supplement (OSMOLITE 1.2 CAL) 1,000 mL (10/05/17 0606)  . heparin 2,600 Units/hr (10/05/17 0524)   PRN Meds: fentaNYL (SUBLIMAZE) injection, ipratropium-albuterol, midazolam, pneumococcal 23 valent vaccine, sodium chloride flush   Vital Signs    Vitals:   10/05/17 0635 10/05/17 0700 10/05/17 0746 10/05/17 0801  BP:      Pulse:    96  Resp:  19  20  Temp:   99.5 F (37.5 C)   TempSrc:   Oral   SpO2:  99%  100%  Weight: 247 lb 12.8 oz (112.4 kg)     Height:        Intake/Output Summary (Last 24 hours) at 10/05/2017 0847 Last data filed at 10/05/2017 0700 Gross per 24 hour  Intake 2899.67 ml  Output 2365 ml  Net 534.67 ml   Filed Weights   10/03/17 0500 10/04/17 0500 10/05/17 0635  Weight: 248 lb 3.8 oz (112.6 kg) 248 lb 7.3 oz (112.7 kg) 247 lb 12.8 oz (112.4 kg)    Telemetry    Sinus rhythm.  Atrial tachycardia.  PVCs. - Personally Reviewed  ECG    n/a - Personally Reviewed  Physical Exam   VS:  BP (!) 151/74   Pulse 96   Temp 99.5 F (37.5 C) (Oral)    Resp 20   Ht 5\' 7"  (1.702 m)   Wt 247 lb 12.8 oz (112.4 kg)   SpO2 100%   BMI 38.81 kg/m  , BMI Body mass index is 38.81 kg/m. GENERAL:  Ill-appearing.  No acute distress.  HEENT: Pupils equal round and reactive, fundi not visualized, oral mucosa unremarkable NECK:  No jugular venous distention, waveform within normal limits, carotid upstroke brisk and symmetric, no bruits LUNGS:  Clear to auscultation bilaterally HEART:  RRR.  PMI not displaced or sustained,S1 and S2 within normal limits, no S3, no S4, no clicks, no rubs, no murmurs ABD:  Open abdomen EXT:  2 plus pulses throughout, 2+ LLE and LUE edema, no cyanosis no clubbing SKIN:  No rashes no nodules NEURO:  Unable to assess PSYCH: [employer] Recent Labs  Lab 09/30/17 1134 09/30/17 2220 10/01/17 0449  10/03/17 1954 10/04/17 0223 10/05/17 0712  NA 149* 149* 150*   < > 148* 147* 141  K 3.5 3.9 3.3*   < > 4.3 4.4 3.8  CL 120* 116* 119*   < >  118* 118* 114*  CO2 22 17* 22   < > 19* 21* 20*  GLUCOSE 122* 159* 117*   < > 137* 148* 119*  BUN 40* 37* 39*   < > 26* 26* 21*  CREATININE 1.36* 1.53* 1.57*   < > 1.40* 1.38* 1.27*  CALCIUM 7.9* 7.9* 7.6*   < > 7.5* 7.6* 7.1*  PROT 5.9* 5.7* 5.4*  --   --   --   --   ALBUMIN 2.4* 2.8* 2.6*  --   --   --   --   AST 28 54* 61*  --   --   --   --   ALT 24 30 37  --   --   --   --   ALKPHOS 81 117 109  --   --   --   --   BILITOT 0.5 0.7 0.9  --   --   --   --   GFRNONAA 52* 45* 44*   < > 50* 51* 57*  GFRAA >60 53* 51*   < > 59* 60* >60  ANIONGAP 7 16* 9   < > 11 8 7    < > = values in this interval not displayed.     Hematology Recent Labs  Lab 10/03/17 0441 10/04/17 0223 10/05/17 0712  WBC 9.4 11.3* 8.2  RBC 2.93* 3.37* 2.96*  HGB 8.1* 9.3* 8.2*  HCT 26.6* 32.5* 27.7*  MCV 90.8 96.4 93.6  MCH 27.6 27.6 27.7  MCHC 30.5 28.6* 29.6*  RDW 15.3 15.8* 15.8*  PLT 273 268 192    Cardiac Enzymes Recent Labs  Lab 09/30/17 2221 10/01/17 0449  10/01/17 0947  TROPONINI 0.06* 1.36* 1.74*   No results for input(s): TROPIPOC in the last 168 hours.   BNPNo results for input(s): BNP, PROBNP in the last 168 hours.   DDimer No results for input(s): DDIMER in the last 168 hours.   Radiology    Dg Abd 1 View  Result Date: 10/04/2017 CLINICAL DATA:  OG tube placement EXAM: ABDOMEN - 1 VIEW COMPARISON:  09/13/2017 FINDINGS: Feeding tube tip overlies the third portion of the duodenum. Overall decreased upper bowel gas. IMPRESSION: Esophageal tube tip overlies the third portion of the duodenum Electronically Signed   By: Donavan Foil M.D.   On: 10/04/2017 23:04   Ir Fluoro Guide Cv Line Right  Result Date: 10/05/2017 CLINICAL DATA:  Gastric ulcer, myocardial infarction, tracheotomy, difficult venous access EXAM: TUNNELED CENTRAL VENOUS CATHETER PLACEMENT WITH ULTRASOUND AND FLUOROSCOPIC GUIDANCE TECHNIQUE: Patency of the right IJ vein was confirmed with ultrasound with image documentation. An appropriate skin site was determined. Region was prepped using maximum barrier technique including cap and mask, sterile gown, sterile gloves, large sterile sheet, and Chlorhexidine as cutaneous antisepsis. The region was infiltrated locally with 1% lidocaine. Under real-time ultrasound guidance, the right IJ vein was accessed with a 21 gauge micropuncture needle; the needle tip within the vein was confirmed with ultrasound image documentation. 37F dual-lumen cuffed powerPICC tunneled from a right anterior chest wall approach to the dermatotomy site. Needle exchanged over the 018 guidewire for transitional dilator, through which the catheter which had been cut to 24 cm was advanced under intermittent fluoroscopy, positioned with its tip at the cavoatrial junction. Spot chest radiograph confirms good catheter position. No pneumothorax. Catheter was flushed per protocol. Catheter secured externally with O Prolene suture. The right IJ dermatotomy site was closed with  Dermabond. COMPLICATIONS: COMPLICATIONS None immediate FLUOROSCOPY TIME:  0.1 minutes; 13 uGym2 DAP COMPARISON:  None IMPRESSION: 1. Technically successful placement of tunneled right IJ tunneled dual-lumen power injectable catheter with ultrasound and fluoroscopic guidance. Ready for routine use. Electronically Signed   By: Lucrezia Europe M.D.   On: 10/05/2017 08:11   Ir US Guide Vasc Access Right  Result Date: 10/05/2017 CLINICAL DATA:  Gastric ulcer, myocardial infarction, tracheotomy, difficult venous access EXAM: TUNNELED CENTRAL VENOUS CATHETER PLACEMENT WITH ULTRASOUND AND FLUOROSCOPIC GUIDANCE TECHNIQUE: Patency of the right IJ vein was confirmed with ultrasound with image documentation. An appropriate skin site was determined. Region was prepped using maximum barrier technique including cap and mask, sterile gown, sterile gloves, large sterile sheet, and Chlorhexidine as cutaneous antisepsis. The region was infiltrated locally with 1% lidocaine. Under real-time ultrasound guidance, the right IJ vein was accessed with a 21 gauge micropuncture needle; the needle tip within the vein was confirmed with ultrasound image documentation. 43F dual-lumen cuffed powerPICC tunneled from a right anterior chest wall approach to the dermatotomy site. Needle exchanged over the 018 guidewire for transitional dilator, through which the catheter which had been cut to 24 cm was advanced under intermittent fluoroscopy, positioned with its tip at the cavoatrial junction. Spot chest radiograph confirms good catheter position. No pneumothorax. Catheter was flushed per protocol. Catheter secured externally with O Prolene suture. The right IJ dermatotomy site was closed with Dermabond. COMPLICATIONS: COMPLICATIONS None immediate FLUOROSCOPY TIME:  0.1 minutes; 13 uGym2 DAP COMPARISON:  None IMPRESSION: 1. Technically successful placement of tunneled right IJ tunneled dual-lumen power injectable catheter with ultrasound and  fluoroscopic guidance. Ready for routine use. Electronically Signed   By: Lucrezia Europe M.D.   On: 10/05/2017 08:11   Dg Chest Port 1 View  Result Date: 10/05/2017 CLINICAL DATA:  Acute respiratory failure EXAM: PORTABLE CHEST 1 VIEW COMPARISON:  09/30/2017 FINDINGS: Right internal jugular PICC line has been placed with the tip in the upper right atrium. Endotracheal tube is unchanged. Feeding tube enters the stomach. The tip is not visualized. Mild cardiomegaly and vascular congestion. Bibasilar atelectasis. No overt edema or effusions. IMPRESSION: Cardiomegaly with vascular congestion and bibasilar atelectasis. Electronically Signed   By: Rolm Baptise M.D.   On: 10/05/2017 07:27    Cardiac Studies   Echo 10/01/17: Study Conclusions  - Left ventricle: Systolic function was mildly to moderately   reduced. The estimated ejection fraction was in the range of 40%   to 45%. Severe hypokinesis of the anteroseptal and anterior   myocardium; consistent with ischemia or infarction in the   distribution of the left anterior descending coronary artery.   However, the apex appears to be spared. Due to tachycardia, there   was fusion of early and atrial contributions to ventricular   filling. The study is not technically sufficient to allow   evaluation of LV diastolic function. - Mitral valve: Calcified annulus.  Impressions:  - Very technically challenging study. Suggest an alternative   imaging technique such as CT or MRI.  Echo 10/03/17: Study Conclusions  - Left ventricle: The cavity size was normal. Wall thickness was   normal. Systolic function was normal. The estimated ejection   fraction was in the range of 60% to 65%. Wall motion was normal;   there were no regional wall motion abnormalities. The study is   not technically sufficient to allow evaluation of LV diastolic   function.  Patient Profile     67 y.o. male with CAD status post medically managed MI in 2015, hypertension,  hyperlipidemia, and ongoing tobacco abuse admitted 5/15 with severe abdominal pain.  CT scan showed portal venous gas.  He underwent mesenteric angiography that showed an ischemic right colon.  He underwent emergent exploratory laparotomy with right hemicolectomy and ileostomy.  Since that time he is undergone several exploratory laparotomies with failed attempts at closure of his abdomen.  He had failure to wean off the ventilator and underwent tracheostomy on 09/12/2017.  On 09/30/2017 he suffered VF arrest.  Assessment & Plan    # VF arrest:  Echo post arrest initially revealed LVEF 40 to 45% but increased to 60 to 65% on 10/03/2017.  He was found to have DVT/PE.  Now on anticoagulation with heparin.  He will need left heart catheterization once medically stable.  Continue metoprolol   # NSTEMI: # CAD:  Prior MI in 2015 without PCI.  No records available.  [facility] was consulted in 2016 for mildly elevated troponin.  He has not followed with cardiology since that time.  Troponin has been mildly elevated throughout the hospitalization, initially 0.5 to 0.29 and thought to be due to demand ischemia.  Increased to 1.36 s/p cardiac arrest and uptrended to 1.74.    He will need an ischemic work-up once more medically stable.  Continue aspirin, heparin and atorvastatin.   # Hypertension: Continue metoprolol 12.5mg  bid.  BP well-controlled on arterial line.  # Hyperlipidemia: Home statin increased to atorvastatin 80mg  this admission.  # Sinus tachycardia: # Frequent PACs: # Paroxysmal atrial fibrillation: Atrial fibrillation noted 6/15 with post conversion pause of 2.26s.  Continue metoprolol and he is on heparin.  # DVT/PE: On heparin.   For questions or updates, please contact [facility] Please consult [email or web address] for contact info under Cardiology/STEMI.      Signed, Skeet Latch, MD  10/05/2017, 8:47 AM

## 2017-10-05 NOTE — Progress Notes (Signed)
RT NOTE:  RT attempted to place ALINE in Left radial artery without success. Pt has edematous extremities which made placement difficult. Larene Beach, RT in charge also attempted. RT unable to stick right radial artery as patient dislodge previous ALINE at beginning of shift. Allen's Test positive and restricted left arm. Dr. Emmit Alexanders ok with RT attempting placement. RN aware RT unable to place ALINE. RN will make MD aware.

## 2017-10-05 NOTE — Progress Notes (Addendum)
[facility]/Trauma Progress Note  12 Days Post-Op   Assessment/Plan Principal Problem: Mesenteric ischemia (HCC) Active Problems: Essential hypertension Arterial atherosclerosis HLD (hyperlipidemia) Abdominal pain PUD (peptic ulcer disease) Occlusion of superior mesenteric artery (HCC) Respiratory failure (HCC) Acute respiratory failure with hypoxemia (HCC) AKI (acute kidney injury) ([place])  Mesenteric ischemia 1.Exploratory laparotomy right hemicolectomy 09/04/2017 Dr. Stark Klein 2.Abdominal exploration, application of temporary abdominal closure device(ABTHERA), 09/04/2017 Dr. Nadeen Landau 3.Exploratory laparotomy, ileostomy, application of open abdominal wound VAC, 09/06/2017 Dr. Georganna Skeans 4. Open abdomen for attempted closure, 09/08/2017 Dr. Judeth Horn 5. Exploratory laparotomy with possible wound closure(prior sutures upper&lower portion of the fascia had broken), 09/10/2017 Dr. Judeth Horn 6. Reexploration open abdominal wound, application ofABTHERAWOUND VAC,09/14/2017 Dr. Greer Pickerel 7. Exploratory laparotomy and partial closure of abdomen using the elastic tension closure device (ABRA), Dr. Hulen Skains, 05/29 8. Exploratory laparotomy and dressing change, Dr. Ninfa Linden, 05/31 9. Changed negative pressure dressing, Dr. Kieth Brightly, 06/03 10.Reopening of recent laparotomy, incisional hernia repair, insertion of mesh, placement of negative pressure dressing, Dr. Kieth Brightly, 06/04 11. Bedside vac change, Dr. Kieth Brightly, 06/07  - no fevers, WBC WNL - extubated on trach collar, CCM changed to a 6 cuff XL, lots of secretions - PE with brief cardiac arrest now on heparin, cards following, adding ASA - L sided weakness CCM considering MRI, not present on admission. CCM spoke to sister - tolerating TF's, stool output down with immodium TID  - CCM would like to move to SDU  MVH:QION feeds VTE: SCD's, heparin GE:XBMW  currently Foley:yes Follow up:TBD  DISPO:vac changeMondayat bedside by surgery team.Move to SDU per CCM    LOS: 31 days    Subjective: CC: open abdomen  Pt doing well. Lots of secretions on trach collar. Okay to sit pt up. Pt denied abdominal pain. He is awake and alert. No family at bedside.   Objective: Vital signs in last 24 hours: Temp:  [98.3 F (36.8 C)-99.5 F (37.5 C)] 99.5 F (37.5 C) (06/16 0746) Pulse Rate:  [85-99] 98 (06/16 0329) Resp:  [16-29] 19 (06/16 0700) BP: (151-188)/(74-89) 151/74 (06/15 0901) SpO2:  [94 %-99 %] 99 % (06/16 0700) Arterial Line BP: (118-186)/(48-86) 127/48 (06/15 1800) FiO2 (%):  [28 %-100 %] 28 % (06/16 0329) Weight:  [112.4 kg (247 lb 12.8 oz)] 112.4 kg (247 lb 12.8 oz) (06/16 0635) Last BM Date: 10/03/17  Intake/Output from previous day: 06/15 0701 - 06/16 0700 In: 3101.7 [I.V.:900; NG/GT:1745; IV Piggyback:456.7] Out: 2425 [Urine:1060; Drains:640; Stool:725] Intake/Output this shift: No intake/output data recorded.  PE: Gen:On trach collar, awake, trying to talk Card:RRR Pulm:trach collar UXL:KGMWN vacwith tan output, no air leak.Tansemisolidstool in ostomy bag Neuro: following commands Skin: warm and dry  Anti-infectives: Anti-infectives (From admission, onward)   Start     Dose/Rate Route Frequency Ordered Stop   10/04/17 0852  ceFAZolin (ANCEF) IVPB 1 g/50 mL premix     over 30 Minutes Intravenous Continuous PRN 10/04/17 0852 10/04/17 0852   10/04/17 0832  ceFAZolin (ANCEF) 2-4 GM/100ML-% IVPB    Note to Pharmacy:  Margaretmary Dys   : cabinet override      10/04/17 0832 10/04/17 2044   09/19/17 0730  ceFAZolin (ANCEF) IVPB 2g/100 mL premix     2 g 200 mL/hr over 30 Minutes Intravenous On call to O.R. 09/19/17 0715 09/19/17 1057   09/17/17 1145  ceFAZolin (ANCEF) 3 g in dextrose 5 % 50 mL IVPB     3 g 100 mL/hr over 30 Minutes Intravenous To  [facility] 09/17/17 0923 09/17/17 1449   09/07/17  1545  cefTRIAXone (ROCEPHIN) 2 g in sodium chloride 0.9 % 100 mL IVPB     2 g 200 mL/hr over 30 Minutes Intravenous Every 24 hours 09/07/17 1543 09/11/17 1605   09/04/17 1400  piperacillin-tazobactam (ZOSYN) IVPB 3.375 g  Status:  Discontinued     3.375 g 12.5 mL/hr over 240 Minutes Intravenous Every 8 hours 09/04/17 1157 09/07/17 1541   09/04/17 1100  ceFAZolin (ANCEF) IVPB 2g/100 mL premix  Status:  Discontinued     2 g 200 mL/hr over 30 Minutes Intravenous Every 8 hours 09/04/17 1035 09/04/17 1157   09/04/17 0630  piperacillin-tazobactam (ZOSYN) IVPB 3.375 g  Status:  Discontinued     3.375 g 100 mL/hr over 30 Minutes Intravenous  Once 09/04/17 0617 09/04/17 0626   09/04/17 0619  piperacillin-tazobactam (ZOSYN) IVPB 3.375 g     3.375 g 100 mL/hr over 30 Minutes Intravenous 60 min pre-op 09/04/17 0620 09/04/17 0717   09/04/17 0030  piperacillin-tazobactam (ZOSYN) IVPB 3.375 g     3.375 g 100 mL/hr over 30 Minutes Intravenous  Once 09/04/17 0024 09/04/17 0144      Lab Results:  Recent Labs    10/04/17 0223 10/05/17 0712  WBC 11.3* 8.2  HGB 9.3* 8.2*  HCT 32.5* 27.7*  PLT 268 192   BMET Recent Labs    10/04/17 0223 10/05/17 0712  NA 147* 141  K 4.4 3.8  CL 118* 114*  CO2 21* 20*  GLUCOSE 148* 119*  BUN 26* 21*  CREATININE 1.38* 1.27*  CALCIUM 7.6* 7.1*   PT/INR No results for input(s): LABPROT, INR in the last 72 hours. CMP     Component Value Date/Time   NA 141 10/05/2017 0712   K 3.8 10/05/2017 0712   CL 114 (H) 10/05/2017 0712   CO2 20 (L) 10/05/2017 0712   GLUCOSE 119 (H) 10/05/2017 0712   BUN 21 (H) 10/05/2017 0712   CREATININE 1.27 (H) 10/05/2017 0712   CALCIUM 7.1 (L) 10/05/2017 0712   PROT 5.4 (L) 10/01/2017 0449   ALBUMIN 2.6 (L) 10/01/2017 0449   AST 61 (H) 10/01/2017 0449   ALT 37 10/01/2017 0449   ALKPHOS 109 10/01/2017 0449   BILITOT 0.9 10/01/2017 0449   GFRNONAA 57 (L) 10/05/2017 0712   GFRAA >60 10/05/2017 0712   Lipase      Component Value Date/Time   LIPASE 37 09/03/2017 1644    Studies/Results: Dg Abd 1 View  Result Date: 10/04/2017 CLINICAL DATA:  OG tube placement EXAM: ABDOMEN - 1 VIEW COMPARISON:  09/13/2017 FINDINGS: Feeding tube tip overlies the third portion of the duodenum. Overall decreased upper bowel gas. IMPRESSION: Esophageal tube tip overlies the third portion of the duodenum Electronically Signed   By: Donavan Foil M.D.   On: 10/04/2017 23:04   Dg Chest Port 1 View  Result Date: 10/05/2017 CLINICAL DATA:  Acute respiratory failure EXAM: PORTABLE CHEST 1 VIEW COMPARISON:  09/30/2017 FINDINGS: Right internal jugular PICC line has been placed with the tip in the upper right atrium. Endotracheal tube is unchanged. Feeding tube enters the stomach. The tip is not visualized. Mild cardiomegaly and vascular congestion. Bibasilar atelectasis. No overt edema or effusions. IMPRESSION: Cardiomegaly with vascular congestion and bibasilar atelectasis. Electronically Signed   By: Rolm Baptise M.D.   On: 10/05/2017 07:27      Kalman Drape , PA-C [facility] 10/05/2017, 8:00 AM  Pager: [phone] Mon-Wed, Friday 7:00am-4:30pm  Thurs 7am-11:30am  Consults: [phone]   Agree with above. WBC normal Slow improvement OK for transfer to SDU.  Imogene Burn. Georgette Dover, MD, [facility]  General/ Trauma Surgery  10/05/2017 10:24 AM

## 2017-10-05 NOTE — Progress Notes (Signed)
[facility] for Heparin Indication: pulmonary embolus  No Known Allergies  Patient Measurements: Height: 5\' 7"  (170.2 cm) Weight: 247 lb 12.8 oz (112.4 kg) IBW/kg (Calculated) : 66.1 Heparin Dosing Weight: 90 kg  Vital Signs: Temp: 99.5 F (37.5 C) (06/16 0746) Temp Source: Oral (06/16 0746) Pulse Rate: 96 (06/16 0801)  Labs: Recent Labs    10/03/17 0441  10/03/17 1954 10/04/17 0223 10/05/17 0712  HGB 8.1*  --   --  9.3* 8.2*  HCT 26.6*  --   --  32.5* 27.7*  PLT 273  --   --  268 192  HEPARINUNFRC  --    < > 0.44 0.46 0.49  CREATININE 1.36*  --  1.40* 1.38* 1.27*   < > = values in this interval not displayed.    Estimated Creatinine Clearance: 67.5 mL/min (A) (by C-G formula based on SCr of 1.27 mg/dL (H)).   Assessment: 67 y.o. male admitted 5/16 s/p multiple abdominal surgeries and tracheostomy, now s/p VFib arrest and found to have PE on IV heparin. Heparin currently therapeutic this morning at 0.46, CBC stable, no bleeding noted.  Goal of Therapy:  Heparin level 0.3-0.7 units/ml Monitor platelets by anticoagulation protocol: Yes   Plan:  Continue Heparin to 2600 units/hr Daily heparin level and CBC  Arrie Senate, PharmD, BCPS PGY-2 Cardiology Pharmacy Resident Phone: [phone] 10/05/2017

## 2017-10-06 ENCOUNTER — Inpatient Hospital Stay (HOSPITAL_COMMUNITY): Payer: PPO

## 2017-10-06 DIAGNOSIS — I4891 Unspecified atrial fibrillation: Secondary | ICD-10-CM

## 2017-10-06 LAB — BASIC METABOLIC PANEL
Anion gap: 7 (ref 5–15)
BUN: 20 mg/dL (ref 6–20)
CALCIUM: 7.1 mg/dL — AB (ref 8.9–10.3)
CO2: 21 mmol/L — ABNORMAL LOW (ref 22–32)
CREATININE: 1.22 mg/dL (ref 0.61–1.24)
Chloride: 112 mmol/L — ABNORMAL HIGH (ref 101–111)
GFR calc Af Amer: >60 mL/min (ref >60)
GFR calc non Af Amer: 60 mL/min — ABNORMAL LOW (ref >60)
GLUCOSE: 104 mg/dL — AB (ref 65–99)
Potassium: 3.7 mmol/L (ref 3.5–5.1)
Sodium: 140 mmol/L (ref 135–145)

## 2017-10-06 LAB — PHOSPHORUS: Phosphorus: 3.3 mg/dL (ref 2.5–4.6)

## 2017-10-06 LAB — GLUCOSE, CAPILLARY
GLUCOSE-CAPILLARY: 104 mg/dL — AB (ref 65–99)
GLUCOSE-CAPILLARY: 116 mg/dL — AB (ref 65–99)
Glucose-Capillary: 107 mg/dL — ABNORMAL HIGH (ref 65–99)
Glucose-Capillary: 108 mg/dL — ABNORMAL HIGH (ref 65–99)
Glucose-Capillary: 112 mg/dL — ABNORMAL HIGH (ref 65–99)
Glucose-Capillary: 115 mg/dL — ABNORMAL HIGH (ref 65–99)
Glucose-Capillary: 116 mg/dL — ABNORMAL HIGH (ref 65–99)
Glucose-Capillary: 120 mg/dL — ABNORMAL HIGH (ref 65–99)
Glucose-Capillary: 121 mg/dL — ABNORMAL HIGH (ref 65–99)

## 2017-10-06 LAB — HEPARIN LEVEL (UNFRACTIONATED): Heparin Unfractionated: 0.45 IU/mL (ref 0.30–0.70)

## 2017-10-06 LAB — CBC
HCT: 27.3 % — ABNORMAL LOW (ref 39.0–52.0)
Hemoglobin: 8 g/dL — ABNORMAL LOW (ref 13.0–17.0)
MCH: 27.5 pg (ref 26.0–34.0)
MCHC: 29.3 g/dL — ABNORMAL LOW (ref 30.0–36.0)
MCV: 93.8 fL (ref 78.0–100.0)
PLATELETS: 187 10*3/uL (ref 150–400)
RBC: 2.91 MIL/uL — ABNORMAL LOW (ref 4.22–5.81)
RDW: 15.4 % (ref 11.5–15.5)
WBC: 6.4 10*3/uL (ref 4.0–10.5)

## 2017-10-06 LAB — MAGNESIUM: Magnesium: 1.9 mg/dL (ref 1.7–2.4)

## 2017-10-06 LAB — MRSA PCR SCREENING: MRSA BY PCR: NEGATIVE

## 2017-10-06 MED ORDER — LOPERAMIDE HCL 1 MG/7.5ML PO SUSP
4.0000 mg | Freq: Three times a day (TID) | ORAL | Status: DC
  Administered 2017-10-06 (×2): 4 mg
  Filled 2017-10-06 (×3): qty 30

## 2017-10-06 MED ORDER — ACETAMINOPHEN 325 MG PO TABS
650.0000 mg | ORAL_TABLET | Freq: Four times a day (QID) | ORAL | Status: DC
  Administered 2017-10-06 – 2017-10-08 (×6): 650 mg
  Filled 2017-10-06 (×5): qty 2

## 2017-10-06 MED ORDER — HYDROMORPHONE HCL 2 MG PO TABS
1.0000 mg | ORAL_TABLET | ORAL | Status: DC
  Administered 2017-10-06 – 2017-10-08 (×10): 1 mg
  Filled 2017-10-06 (×9): qty 1

## 2017-10-06 MED ORDER — GLYCOPYRROLATE 0.2 MG/ML IJ SOLN
0.2000 mg | INTRAMUSCULAR | Status: DC | PRN
  Administered 2017-10-06 – 2017-10-09 (×5): 0.2 mg via INTRAVENOUS
  Filled 2017-10-06 (×5): qty 1

## 2017-10-06 NOTE — Progress Notes (Signed)
Progress Note  Patient Name: Luke Kaufman Date of Encounter: 10/06/2017  Primary Cardiologist: Fransico Him, MD   Subjective   Surgery at bedside performing change of abdominal wound vac. Patient is alert, appears anxious. No complaints of chest pain.   Inpatient Medications    Scheduled Meds: . acetaminophen  650 mg Oral Q6H  . aspirin  81 mg Per Tube Daily  . atorvastatin  80 mg Per Tube q1800  . chlorhexidine  15 mL Mouth Rinse BID  . Chlorhexidine Gluconate Cloth  6 each Topical Daily  . feeding supplement (PRO-STAT SUGAR FREE 64)  60 mL Per Tube BID  . free water  300 mL Per Tube Q6H  . HYDROmorphone  1 mg Oral Q4H  . insulin aspart  0-15 Units Subcutaneous Q4H  . loperamide HCl  4 mg Oral TID  . mouth rinse  15 mL Mouth Rinse q12n4p  . metoprolol tartrate  25 mg Per Tube BID  . sodium chloride flush  10-40 mL Intracatheter Q12H   Continuous Infusions: . sodium chloride Stopped (10/05/17 1405)  . sodium chloride    . famotidine (PEPCID) IV Stopped (10/05/17 2315)  . feeding supplement (OSMOLITE 1.2 CAL) 1,000 mL (10/06/17 0332)  . heparin 2,600 Units/hr (10/06/17 0033)   PRN Meds: fentaNYL (SUBLIMAZE) injection, ipratropium-albuterol, midazolam, pneumococcal 23 valent vaccine, sodium chloride flush   Vital Signs    Vitals:   10/05/17 2326 10/06/17 0016 10/06/17 0421 10/06/17 0438  BP: 112/69 119/64 (!) 120/97 139/73  Pulse: 89 92 (!) 103 88  Resp: (!) 21 (!) 26 (!) 21 (!) 25  Temp:  98.7 F (37.1 C)  98.2 F (36.8 C)  TempSrc:  Oral  Oral  SpO2: 99% 99% 99% 97%  Weight:    246 lb 8 oz (111.8 kg)  Height:        Intake/Output Summary (Last 24 hours) at 10/06/2017 0751 Last data filed at 10/06/2017 0600 Gross per 24 hour  Intake 2723.83 ml  Output 1700 ml  Net 1023.83 ml   Filed Weights   10/04/17 0500 10/05/17 0635 10/06/17 0438  Weight: 248 lb 7.3 oz (112.7 kg) 247 lb 12.8 oz (112.4 kg) 246 lb 8 oz (111.8 kg)    Telemetry    NSR/Sinus  tachycardia this AM (onset with timing of wound vac exchange) - Personally Reviewed  Physical Exam   Exam limited by wound vac exchange  GEN: Ill appearing gentleman, laying in bed appearing mildly uncomfortable.   Neck: No JVD Cardiac: tachycardic, regular rhythm, no murmurs, rubs, or gallops.  Respiratory: upper airway rhonchi; trach with secretions  GI: abdomen open MS:+LLE edema; No deformity. Neuro:  moving upper extremities spontaneously Psych: mildly anxious  Labs    Chemistry Recent Labs  Lab 09/30/17 1134 09/30/17 2220 10/01/17 0449  10/04/17 0223 10/05/17 0712 10/06/17 0445  NA 149* 149* 150*   < > 147* 141 140  K 3.5 3.9 3.3*   < > 4.4 3.8 3.7  CL 120* 116* 119*   < > 118* 114* 112*  CO2 22 17* 22   < > 21* 20* 21*  GLUCOSE 122* 159* 117*   < > 148* 119* 104*  BUN 40* 37* 39*   < > 26* 21* 20  CREATININE 1.36* 1.53* 1.57*   < > 1.38* 1.27* 1.22  CALCIUM 7.9* 7.9* 7.6*   < > 7.6* 7.1* 7.1*  PROT 5.9* 5.7* 5.4*  --   --   --   --  ALBUMIN 2.4* 2.8* 2.6*  --   --   --   --   AST 28 54* 61*  --   --   --   --   ALT 24 30 37  --   --   --   --   ALKPHOS 81 117 109  --   --   --   --   BILITOT 0.5 0.7 0.9  --   --   --   --   GFRNONAA 52* 45* 44*   < > 51* 57* 60*  GFRAA >60 53* 51*   < > 60* >60 >60  ANIONGAP 7 16* 9   < > 8 7 7    < > = values in this interval not displayed.     Hematology Recent Labs  Lab 10/04/17 0223 10/05/17 0712 10/06/17 0445  WBC 11.3* 8.2 6.4  RBC 3.37* 2.96* 2.91*  HGB 9.3* 8.2* 8.0*  HCT 32.5* 27.7* 27.3*  MCV 96.4 93.6 93.8  MCH 27.6 27.7 27.5  MCHC 28.6* 29.6* 29.3*  RDW 15.8* 15.8* 15.4  PLT 268 192 187    Cardiac Enzymes Recent Labs  Lab 09/30/17 2221 10/01/17 0449 10/01/17 0947  TROPONINI 0.06* 1.36* 1.74*   No results for input(s): TROPIPOC in the last 168 hours.   BNPNo results for input(s): BNP, PROBNP in the last 168 hours.   DDimer No results for input(s): DDIMER in the last 168 hours.   Radiology      Dg Abd 1 View  Result Date: 10/04/2017 CLINICAL DATA:  OG tube placement EXAM: ABDOMEN - 1 VIEW COMPARISON:  09/13/2017 FINDINGS: Feeding tube tip overlies the third portion of the duodenum. Overall decreased upper bowel gas. IMPRESSION: Esophageal tube tip overlies the third portion of the duodenum Electronically Signed   By: Donavan Foil M.D.   On: 10/04/2017 23:04   Ir Fluoro Guide Cv Line Right  Result Date: 10/05/2017 CLINICAL DATA:  Gastric ulcer, myocardial infarction, tracheotomy, difficult venous access EXAM: TUNNELED CENTRAL VENOUS CATHETER PLACEMENT WITH ULTRASOUND AND FLUOROSCOPIC GUIDANCE TECHNIQUE: Patency of the right IJ vein was confirmed with ultrasound with image documentation. An appropriate skin site was determined. Region was prepped using maximum barrier technique including cap and mask, sterile gown, sterile gloves, large sterile sheet, and Chlorhexidine as cutaneous antisepsis. The region was infiltrated locally with 1% lidocaine. Under real-time ultrasound guidance, the right IJ vein was accessed with a 21 gauge micropuncture needle; the needle tip within the vein was confirmed with ultrasound image documentation. 37F dual-lumen cuffed powerPICC tunneled from a right anterior chest wall approach to the dermatotomy site. Needle exchanged over the 018 guidewire for transitional dilator, through which the catheter which had been cut to 24 cm was advanced under intermittent fluoroscopy, positioned with its tip at the cavoatrial junction. Spot chest radiograph confirms good catheter position. No pneumothorax. Catheter was flushed per protocol. Catheter secured externally with O Prolene suture. The right IJ dermatotomy site was closed with Dermabond. COMPLICATIONS: COMPLICATIONS None immediate FLUOROSCOPY TIME:  0.1 minutes; 13 uGym2 DAP COMPARISON:  None IMPRESSION: 1. Technically successful placement of tunneled right IJ tunneled dual-lumen power injectable catheter with ultrasound  and fluoroscopic guidance. Ready for routine use. Electronically Signed   By: Lucrezia Europe M.D.   On: 10/05/2017 08:11   Ir US Guide Vasc Access Right  Result Date: 10/05/2017 CLINICAL DATA:  Gastric ulcer, myocardial infarction, tracheotomy, difficult venous access EXAM: TUNNELED CENTRAL VENOUS CATHETER PLACEMENT WITH ULTRASOUND AND FLUOROSCOPIC GUIDANCE TECHNIQUE:  Patency of the right IJ vein was confirmed with ultrasound with image documentation. An appropriate skin site was determined. Region was prepped using maximum barrier technique including cap and mask, sterile gown, sterile gloves, large sterile sheet, and Chlorhexidine as cutaneous antisepsis. The region was infiltrated locally with 1% lidocaine. Under real-time ultrasound guidance, the right IJ vein was accessed with a 21 gauge micropuncture needle; the needle tip within the vein was confirmed with ultrasound image documentation. 42F dual-lumen cuffed powerPICC tunneled from a right anterior chest wall approach to the dermatotomy site. Needle exchanged over the 018 guidewire for transitional dilator, through which the catheter which had been cut to 24 cm was advanced under intermittent fluoroscopy, positioned with its tip at the cavoatrial junction. Spot chest radiograph confirms good catheter position. No pneumothorax. Catheter was flushed per protocol. Catheter secured externally with O Prolene suture. The right IJ dermatotomy site was closed with Dermabond. COMPLICATIONS: COMPLICATIONS None immediate FLUOROSCOPY TIME:  0.1 minutes; 13 uGym2 DAP COMPARISON:  None IMPRESSION: 1. Technically successful placement of tunneled right IJ tunneled dual-lumen power injectable catheter with ultrasound and fluoroscopic guidance. Ready for routine use. Electronically Signed   By: Lucrezia Europe M.D.   On: 10/05/2017 08:11   Dg Chest Port 1 View  Result Date: 10/06/2017 CLINICAL DATA:  Acute respiratory failure EXAM: PORTABLE CHEST 1 VIEW COMPARISON:  Yesterday  FINDINGS: Tracheostomy tube is well seated. A feeding tube reaches the stomach at least. Right IJ line with tip at the upper cavoatrial junction. Stable heart size and mediastinal contours. Mildly improved aeration. There is still streaky density at the bases attributed atelectasis. No effusion or pneumothorax seen. IMPRESSION: 1. Mildly improved aeration. 2. Stable hardware positioning. Electronically Signed   By: Monte Fantasia M.D.   On: 10/06/2017 07:31   Dg Chest Port 1 View  Result Date: 10/05/2017 CLINICAL DATA:  Acute respiratory failure EXAM: PORTABLE CHEST 1 VIEW COMPARISON:  09/30/2017 FINDINGS: Right internal jugular PICC line has been placed with the tip in the upper right atrium. Endotracheal tube is unchanged. Feeding tube enters the stomach. The tip is not visualized. Mild cardiomegaly and vascular congestion. Bibasilar atelectasis. No overt edema or effusions. IMPRESSION: Cardiomegaly with vascular congestion and bibasilar atelectasis. Electronically Signed   By: Rolm Baptise M.D.   On: 10/05/2017 07:27    Cardiac Studies   Echo 10/01/17: Study Conclusions  - Left ventricle: Systolic function was mildly to moderately reduced. The estimated ejection fraction was in the range of 40% to 45%. Severe hypokinesis of the anteroseptal and anterior myocardium; consistent with ischemia or infarction in the distribution of the left anterior descending coronary artery. However, the apex appears to be spared. Due to tachycardia, there was fusion of early and atrial contributions to ventricular filling. The study is not technically sufficient to allow evaluation of LV diastolic function. - Mitral valve: Calcified annulus.  Impressions:  - Very technically challenging study. Suggest an alternative imaging technique such as CT or MRI.  Echo 10/03/17: Study Conclusions  - Left ventricle: The cavity size was normal. Wall thickness was normal. Systolic function  was normal. The estimated ejection fraction was in the range of 60% to 65%. Wall motion was normal; there were no regional wall motion abnormalities. The study is not technically sufficient to allow evaluation of LV diastolic function.    Patient Profile     67 y.o. male with CAD status post medically managed MI in 2015, hypertension, hyperlipidemia, and ongoing tobacco abuse admitted 5/15 with severe  abdominal pain.  CT scan showed portal venous gas.  He underwent mesenteric angiography that showed an ischemic right colon.  He underwent emergent exploratory laparotomy with right hemicolectomy and ileostomy.  Since that time he is undergone several exploratory laparotomies with failed attempts at closure of his abdomen.  He had failure to wean off the ventilator and underwent tracheostomy on 09/12/2017.  On 09/30/2017 he suffered VF arrest.   Assessment & Plan    1. VF arrest: initial echo post arrest with EF 40-45%, improved to 60-65% on repeat echo 6/14. He was found to have multiple DVTs/PE and is on IV heparin.  - Plan for LHC once medically stable  2. NSTEMI in patient with known CAD: noted to have prior MI in 2015 without PCI. Trop initially elevated this admission to 0.5>0.29, thought to be due to demand ischemia in the setting of mesenteric ischemia. Trop peaked at 1.74 post cardiac arrest.  - Continue ASA, heparin, and atorvastatin - Plan for LHC once medically stable  3. Paroxysmal atrial fibrillation: noted on 6/15 with post conversion pause of 2.26s - Continue metoprolol and heparin   4. HTN: BP stable - Continue metoprolol  5. HLD:  - Continue statin (increased from home dose)  6. 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  8. Acute respiratory failure: found to have PE; also  with history of COPD. S/p trach - Continue management per primary team/ pulmonary team          For questions or updates, please contact [place] Please consult [email or web address] for contact info under Cardiology/STEMI.      Signed, Abigail Butts, PA-C  10/06/2017, 7:51 AM   [phone]

## 2017-10-06 NOTE — Progress Notes (Signed)
Patient ID: Luke Kaufman, male   DOB: 18-Oct-1950, 67 y.o.   MRN: [id number]    13 Days Post-Op  Subjective: Pt feels like he can't breath at times secondary to some secretions.    Objective: Vital signs in last 24 hours: Temp:  [97.7 F (36.5 C)-98.7 F (37.1 C)] 98.2 F (36.8 C) (06/17 0438) Pulse Rate:  [87-103] 88 (06/17 0438) Resp:  [19-28] 25 (06/17 0438) BP: (96-148)/(64-97) 139/73 (06/17 0438) SpO2:  [96 %-100 %] 97 % (06/17 0438) FiO2 (%):  [28 %] 28 % (06/17 0421) Weight:  [111.8 kg (246 lb 8 oz)] 111.8 kg (246 lb 8 oz) (06/17 0438) Last BM Date: (ostomy)  Intake/Output from previous day: 06/16 0701 - 06/17 0700 In: 2723.8 [I.V.:668.8; NG/GT:1825; IV Piggyback:230] Out: 1700 [Urine:825; Drains:425; Stool:450] Intake/Output this shift: No intake/output data recorded.  PE: Abd: soft, VAC change performed and white sponge placed under black sponge today.  Measurements are 28x24x1cm     Lab Results:  Recent Labs    10/05/17 0712 10/06/17 0445  WBC 8.2 6.4  HGB 8.2* 8.0*  HCT 27.7* 27.3*  PLT 192 187   BMET Recent Labs    10/05/17 0712 10/06/17 0445  NA 141 140  K 3.8 3.7  CL 114* 112*  CO2 20* 21*  GLUCOSE 119* 104*  BUN 21* 20  CREATININE 1.27* 1.22  CALCIUM 7.1* 7.1*   PT/INR No results for input(s): LABPROT, INR in the last 72 hours. CMP     Component Value Date/Time   NA 140 10/06/2017 0445   K 3.7 10/06/2017 0445   CL 112 (H) 10/06/2017 0445   CO2 21 (L) 10/06/2017 0445   GLUCOSE 104 (H) 10/06/2017 0445   BUN 20 10/06/2017 0445   CREATININE 1.22 10/06/2017 0445   CALCIUM 7.1 (L) 10/06/2017 0445   PROT 5.4 (L) 10/01/2017 0449   ALBUMIN 2.6 (L) 10/01/2017 0449   AST 61 (H) 10/01/2017 0449   ALT 37 10/01/2017 0449   ALKPHOS 109 10/01/2017 0449   BILITOT 0.9 10/01/2017 0449   GFRNONAA 60 (L) 10/06/2017 0445   GFRAA >60 10/06/2017 0445   Lipase     Component Value Date/Time   LIPASE 37 09/03/2017 1644        Studies/Results: Dg Abd 1 View  Result Date: 10/04/2017 CLINICAL DATA:  OG tube placement EXAM: ABDOMEN - 1 VIEW COMPARISON:  09/13/2017 FINDINGS: Feeding tube tip overlies the third portion of the duodenum. Overall decreased upper bowel gas. IMPRESSION: Esophageal tube tip overlies the third portion of the duodenum Electronically Signed   By: Donavan Foil M.D.   On: 10/04/2017 23:04   Ir Fluoro Guide Cv Line Right  Result Date: 10/05/2017 CLINICAL DATA:  Gastric ulcer, myocardial infarction, tracheotomy, difficult venous access EXAM: TUNNELED CENTRAL VENOUS CATHETER PLACEMENT WITH ULTRASOUND AND FLUOROSCOPIC GUIDANCE TECHNIQUE: Patency of the right IJ vein was confirmed with ultrasound with image documentation. An appropriate skin site was determined. Region was prepped using maximum barrier technique including cap and mask, sterile gown, sterile gloves, large sterile sheet, and Chlorhexidine as cutaneous antisepsis. The region was infiltrated locally with 1% lidocaine. Under real-time ultrasound guidance, the right IJ vein was accessed with a 21 gauge micropuncture needle; the needle tip within the vein was confirmed with ultrasound image documentation. 42F dual-lumen cuffed powerPICC tunneled from a right anterior chest wall approach to the dermatotomy site. Needle exchanged over the 018 guidewire for transitional dilator, through which the catheter which had been cut to 24  cm was advanced under intermittent fluoroscopy, positioned with its tip at the cavoatrial junction. Spot chest radiograph confirms good catheter position. No pneumothorax. Catheter was flushed per protocol. Catheter secured externally with O Prolene suture. The right IJ dermatotomy site was closed with Dermabond. COMPLICATIONS: COMPLICATIONS None immediate FLUOROSCOPY TIME:  0.1 minutes; 13 uGym2 DAP COMPARISON:  None IMPRESSION: 1. Technically successful placement of tunneled right IJ tunneled dual-lumen power injectable  catheter with ultrasound and fluoroscopic guidance. Ready for routine use. Electronically Signed   By: Lucrezia Europe M.D.   On: 10/05/2017 08:11   Ir US Guide Vasc Access Right  Result Date: 10/05/2017 CLINICAL DATA:  Gastric ulcer, myocardial infarction, tracheotomy, difficult venous access EXAM: TUNNELED CENTRAL VENOUS CATHETER PLACEMENT WITH ULTRASOUND AND FLUOROSCOPIC GUIDANCE TECHNIQUE: Patency of the right IJ vein was confirmed with ultrasound with image documentation. An appropriate skin site was determined. Region was prepped using maximum barrier technique including cap and mask, sterile gown, sterile gloves, large sterile sheet, and Chlorhexidine as cutaneous antisepsis. The region was infiltrated locally with 1% lidocaine. Under real-time ultrasound guidance, the right IJ vein was accessed with a 21 gauge micropuncture needle; the needle tip within the vein was confirmed with ultrasound image documentation. 4F dual-lumen cuffed powerPICC tunneled from a right anterior chest wall approach to the dermatotomy site. Needle exchanged over the 018 guidewire for transitional dilator, through which the catheter which had been cut to 24 cm was advanced under intermittent fluoroscopy, positioned with its tip at the cavoatrial junction. Spot chest radiograph confirms good catheter position. No pneumothorax. Catheter was flushed per protocol. Catheter secured externally with O Prolene suture. The right IJ dermatotomy site was closed with Dermabond. COMPLICATIONS: COMPLICATIONS None immediate FLUOROSCOPY TIME:  0.1 minutes; 13 uGym2 DAP COMPARISON:  None IMPRESSION: 1. Technically successful placement of tunneled right IJ tunneled dual-lumen power injectable catheter with ultrasound and fluoroscopic guidance. Ready for routine use. Electronically Signed   By: Lucrezia Europe M.D.   On: 10/05/2017 08:11   Dg Chest Port 1 View  Result Date: 10/06/2017 CLINICAL DATA:  Acute respiratory failure EXAM: PORTABLE CHEST 1 VIEW  COMPARISON:  Yesterday FINDINGS: Tracheostomy tube is well seated. A feeding tube reaches the stomach at least. Right IJ line with tip at the upper cavoatrial junction. Stable heart size and mediastinal contours. Mildly improved aeration. There is still streaky density at the bases attributed atelectasis. No effusion or pneumothorax seen. IMPRESSION: 1. Mildly improved aeration. 2. Stable hardware positioning. Electronically Signed   By: Monte Fantasia M.D.   On: 10/06/2017 07:31   Dg Chest Port 1 View  Result Date: 10/05/2017 CLINICAL DATA:  Acute respiratory failure EXAM: PORTABLE CHEST 1 VIEW COMPARISON:  09/30/2017 FINDINGS: Right internal jugular PICC line has been placed with the tip in the upper right atrium. Endotracheal tube is unchanged. Feeding tube enters the stomach. The tip is not visualized. Mild cardiomegaly and vascular congestion. Bibasilar atelectasis. No overt edema or effusions. IMPRESSION: Cardiomegaly with vascular congestion and bibasilar atelectasis. Electronically Signed   By: Rolm Baptise M.D.   On: 10/05/2017 07:27    Anti-infectives: Anti-infectives (From admission, onward)   Start     Dose/Rate Route Frequency Ordered Stop   10/04/17 0852  ceFAZolin (ANCEF) IVPB 1 g/50 mL premix     over 30 Minutes Intravenous Continuous PRN 10/04/17 0852 10/04/17 0852   10/04/17 0832  ceFAZolin (ANCEF) 2-4 GM/100ML-% IVPB    Note to Pharmacy:  Margaretmary Dys   : cabinet override  10/04/17 0832 10/04/17 2044   09/19/17 0730  ceFAZolin (ANCEF) IVPB 2g/100 mL premix     2 g 200 mL/hr over 30 Minutes Intravenous On call to O.R. 09/19/17 0715 09/19/17 1057   09/17/17 1145  ceFAZolin (ANCEF) 3 g in dextrose 5 % 50 mL IVPB     3 g 100 mL/hr over 30 Minutes Intravenous To [facility] 09/17/17 0923 09/17/17 1449   09/07/17 1545  cefTRIAXone (ROCEPHIN) 2 g in sodium chloride 0.9 % 100 mL IVPB     2 g 200 mL/hr over 30 Minutes Intravenous Every 24 hours 09/07/17 1543 09/11/17  1605   09/04/17 1400  piperacillin-tazobactam (ZOSYN) IVPB 3.375 g  Status:  Discontinued     3.375 g 12.5 mL/hr over 240 Minutes Intravenous Every 8 hours 09/04/17 1157 09/07/17 1541   09/04/17 1100  ceFAZolin (ANCEF) IVPB 2g/100 mL premix  Status:  Discontinued     2 g 200 mL/hr over 30 Minutes Intravenous Every 8 hours 09/04/17 1035 09/04/17 1157   09/04/17 0630  piperacillin-tazobactam (ZOSYN) IVPB 3.375 g  Status:  Discontinued     3.375 g 100 mL/hr over 30 Minutes Intravenous  Once 09/04/17 0617 09/04/17 0626   09/04/17 0619  piperacillin-tazobactam (ZOSYN) IVPB 3.375 g     3.375 g 100 mL/hr over 30 Minutes Intravenous 60 min pre-op 09/04/17 0620 09/04/17 0717   09/04/17 0030  piperacillin-tazobactam (ZOSYN) IVPB 3.375 g     3.375 g 100 mL/hr over 30 Minutes Intravenous  Once 09/04/17 0024 09/04/17 0144       Assessment/Plan Principal Problem: Mesenteric ischemia (HCC) Active Problems: Essential hypertension Arterial atherosclerosis HLD (hyperlipidemia) Abdominal pain PUD (peptic ulcer disease) Occlusion of superior mesenteric artery (HCC) Respiratory failure (HCC) Acute respiratory failure with hypoxemia (HCC) AKI (acute kidney injury) ([place])  Mesenteric ischemia 1.Exploratory laparotomy right hemicolectomy 09/04/2017 Dr. Stark Klein 2.Abdominal exploration, application of temporary abdominal closure device(ABTHERA), 09/04/2017 Dr. Nadeen Landau 3.Oratory laparotomy, ileostomy, application of open abdominal wound VAC, 09/06/2017 Dr. Georganna Skeans 4. Open abdomen for attempted closure, 09/08/2017 Dr. Judeth Horn 5. Oratory laparotomy with possible wound closure(prior sutures upper&lower portion of the fascia had broken), 09/10/2017 Dr. Judeth Horn 6. Reexploration open abdominal wound, application ofABTHERAWOUND VAC,09/14/2017 Dr. Greer Pickerel 7. Exploratory laparotomy and partial closure of abdomen using the elastic tension closure  device (ABRA), Dr. Hulen Skains, 05/29 8. exploratory laparotomy and dressing change, Dr. Ninfa Linden, 05/31 9. Changed negative pressure dressing, Dr. Kieth Brightly, 06/03 10.reopening of recent laparotomy, incisional hernia repair, insertion of mesh, placement of negative pressure dressing, Dr. Kieth Brightly, 06/04 11. Bedside vac change, Dr. Kieth Brightly, 06/07-6/17 [place] to take over on 6/19  - no fevers and WBC normal - extubated on trach collar, CCM changed to a 6 cuff - PE with brief cardiac arrest now on heparin, cards following, ECHO pending - L sided weakness CCM considering MRI, not present on admission. CCM spoke to sister  - tolerating TF's, stool output down with immodium, 450cc yesterday  DJS:HFWY feeds VTE: SCD's, heparin OV:ZCHY currently Foley:yes Follow up:TBD  DISPO:vac changetoday at bedside by surgery team, will ask WOC to take over at this time.  Will place white sponge under black sponge.    LOS: 32 days    Henreitta Cea , [facility] Surgery 10/06/2017, 8:23 AM Pager: [phone]

## 2017-10-06 NOTE — Procedures (Signed)
Indications for procedure: VAC dressing on abdominal wound needs to be changed  Preoperative diagnosis VAC in place on abdominal wound  Postoperative diagnosis:  No evidence of fistula or enteric content leak  Procedure: Bedside VAC change 28x24x1cm in size  Surgeon:Bernardette Waldron PA-C  Procedure in detail:We did a time out. He received IV pain medicine.The old VAC sponge was removed. There wasminimalfibrinous exudate around the edges of the fascia but no evidence of enteric content leak or fistula. We fashioned a new VACdressingusing 2large white and then black VAC sponges cut to shapeto totally cover the area. 28x24x1cm.The VAC drape wasapplied and hooked up the back suction pump. Excellent seal was obtained. He tolerated the procedure well. He remained monitored in the intensive care unit throughout.  Luke Kaufman 9:16 AM 10/06/2017

## 2017-10-06 NOTE — Progress Notes (Signed)
PULMONARY / CRITICAL CARE MEDICINE   Name: Dior Stepter MRN: [id number] DOB: 09-28-1950    ADMISSION DATE:  09/03/2017 CONSULTATION DATE:  5/16  REFERRING MD:  Oneida Alar  CHIEF COMPLAINT:  Post Op Vent management  Brief history:   67 y/o male presented on 5/15 with abdominal pain fond to have ischemic right colon in setting of SMA occlusion requiring emergent bypass and eventually R hemicolectomy and ilestomy. Fascia remains open, agitation barrier to weaning. Trached 5/24., to return to OR 5/26, 5/29 now with mesh, wound vac   STUDIES:  CT ab/pelvis 5/15 > portal venous gas in liver, sigmoid colon with diverticular disease, atrophic left kidney CT head 6/12 > chronic ischemia, right maxillary sinus fluid level and bilateral mastoid and middle ear effusions. CTA chest 6/12 > LLL PE (RV / LV 0.89). Echo 6/12 > EF 40-45%, ? Ischemia or infarction in distribution of LAD given severe hypokinesis. UE duplex 6/12 > DVT in right brachial vein, SVT in left basilic and cephalic veins. LE dupled 6/12 > DVT in left CFV and FV.  Negative on right.  CULTURES: 5/17 resp > e coli resistant to multiple antibiotics 5/17 blood > negative  ANTIBIOTICS: 5/15 zosyn > 5/19  5/19 ceftriaxone > 5/23   LINES/TUBES: 5/16 R [place] CVL > 5/24 5/16 ETT > 5/24 5/24 Trach > 6/10 prox XLT placed >>> 6/14 downsized to #6 5/18 R femoral arterial line > 5/24 5/24 LUA PICC>> 6/15 6/14 Tunneled rt [place] PICC >>  SIGNIFICANT EVENTS: 5/16 mesenteric angiogram and aorto to SMA bypass 5/16 ex lap R hemicolectomy 5/18 ileostomy 5/19 abdominal washout and partial fascial closure 5/21 TF started, hypoxia with turning  5/22 Return to OR for exploration, attempted closure > unable 5/24 Trach 5/26 OR for debridement and dressing change 5/29 Exploratory laparotomy, placement of ABBRA closure system 5/31 - No events overnight. Plans to return to OR for dressing change  6/11- tolerating SBT/ off sedation; Vfib arrest that  evening 6/12 VAC changed 6/14 Down size trach to 6 cuffed  SUBJECTIVE/OVERNIGHT/INTERVAL HX:  Continues to tolerate trach collar Has required suctioning for most of his secretion management - too weak to clear. Currently suctioning about q2h, thin clear secretions.   VITAL SIGNS: BP 131/63 (BP Location: Right Leg)   Pulse 92   Temp 98.4 F (36.9 C) (Oral)   Resp (!) 28   Ht 5\' 7"  (1.702 m)   Wt 111.8 kg (246 lb 8 oz)   SpO2 99%   BMI 38.61 kg/m   HEMODYNAMICS:    VENTILATOR SETTINGS: FiO2 (%):  [28 %] 28 %  INTAKE / OUTPUT:  Intake/Output Summary (Last 24 hours) at 10/06/2017 1119 Last data filed at 10/06/2017 0600 Gross per 24 hour  Intake 2219.83 ml  Output 1700 ml  Net 519.83 ml    Physical exam  Gen:   Obese, ill appearing, NAD HEENT:  OP clear, trach in good position, some clear secretions on gauze, cuff down Lungs:   Bilateral rhonchi, no wheeze CV:      Regular, no M Abd:   Wound vac in place, BS + Ext: 1+ pretibial edema Skin:    No rash Neuro: awake, nods to questions but is globally weak, weak cough   PULMONARY Recent Labs  Lab 09/30/17 2157 09/30/17 2250 10/01/17 0459 10/03/17 1504  PHART 7.181* 7.419 7.458* 7.508*  PCO2ART 44.1 32.2 28.1* 25.1*  PO2ART 82.0* 355* 127.0* 93.0  HCO3 16.3* 20.2 19.8* 20.0  TCO2 18*  --  21*  21*  O2SAT 92.0 99.6 99.0 98.0    CBC Recent Labs  Lab 10/04/17 0223 10/05/17 0712 10/06/17 0445  HGB 9.3* 8.2* 8.0*  HCT 32.5* 27.7* 27.3*  WBC 11.3* 8.2 6.4  PLT 268 192 187    COAGULATION No results for input(s): INR in the last 168 hours.  CARDIAC   Recent Labs  Lab 09/30/17 2221 10/01/17 0449 10/01/17 0947  TROPONINI 0.06* 1.36* 1.74*   No results for input(s): PROBNP in the last 168 hours.   CHEMISTRY Recent Labs  Lab 09/30/17 1134  10/01/17 0449 10/02/17 0515 10/03/17 0441 10/03/17 1954 10/04/17 0223 10/05/17 0712 10/06/17 0445  NA 149*   < > 150* 150* 145 148* 147* 141 140  K 3.5    < > 3.3* 3.4* 3.6 4.3 4.4 3.8 3.7  CL 120*   < > 119* 118* 119* 118* 118* 114* 112*  CO2 22   < > 22 22 20* 19* 21* 20* 21*  GLUCOSE 122*   < > 117* 134* 127* 137* 148* 119* 104*  BUN 40*   < > 39* 33* 27* 26* 26* 21* 20  CREATININE 1.36*   < > 1.57* 1.39* 1.36* 1.40* 1.38* 1.27* 1.22  CALCIUM 7.9*   < > 7.6* 7.5* 7.2* 7.5* 7.6* 7.1* 7.1*  MG 2.5*   < > 2.1 2.3 2.0  --   --  1.9 1.9  PHOS 3.2  --   --  2.8 2.0*  --   --  2.9 3.3   < > = values in this interval not displayed.   Estimated Creatinine Clearance: 70.1 mL/min (by C-G formula based on SCr of 1.22 mg/dL).   LIVER Recent Labs  Lab 09/30/17 1134 09/30/17 2220 10/01/17 0449  AST 28 54* 61*  ALT 24 30 37  ALKPHOS 81 117 109  BILITOT 0.5 0.7 0.9  PROT 5.9* 5.7* 5.4*  ALBUMIN 2.4* 2.8* 2.6*     INFECTIOUS Recent Labs  Lab 09/30/17 2214 09/30/17 2221 10/01/17 0220 10/01/17 0449 10/02/17 0515  LATICACIDVEN 6.0*  --  1.2  --   --   PROCALCITON  --  0.23  --  1.47 1.80     ENDOCRINE CBG (last 3)  Recent Labs    10/06/17 0013 10/06/17 0435 10/06/17 0820  GLUCAP 116* 107* 120*     IMAGING x48h  - image(s) personally visualized  -   highlighted in bold Dg Abd 1 View  Result Date: 10/04/2017 CLINICAL DATA:  OG tube placement EXAM: ABDOMEN - 1 VIEW COMPARISON:  09/13/2017 FINDINGS: Feeding tube tip overlies the third portion of the duodenum. Overall decreased upper bowel gas. IMPRESSION: Esophageal tube tip overlies the third portion of the duodenum Electronically Signed   By: Donavan Foil M.D.   On: 10/04/2017 23:04   Dg Chest Port 1 View  Result Date: 10/06/2017 CLINICAL DATA:  Acute respiratory failure EXAM: PORTABLE CHEST 1 VIEW COMPARISON:  Yesterday FINDINGS: Tracheostomy tube is well seated. A feeding tube reaches the stomach at least. Right IJ line with tip at the upper cavoatrial junction. Stable heart size and mediastinal contours. Mildly improved aeration. There is still streaky density at the bases  attributed atelectasis. No effusion or pneumothorax seen. IMPRESSION: 1. Mildly improved aeration. 2. Stable hardware positioning. Electronically Signed   By: Monte Fantasia M.D.   On: 10/06/2017 07:31   Dg Chest Port 1 View  Result Date: 10/05/2017 CLINICAL DATA:  Acute respiratory failure EXAM: PORTABLE CHEST 1 VIEW COMPARISON:  09/30/2017 FINDINGS: Right internal jugular PICC line has been placed with the tip in the upper right atrium. Endotracheal tube is unchanged. Feeding tube enters the stomach. The tip is not visualized. Mild cardiomegaly and vascular congestion. Bibasilar atelectasis. No overt edema or effusions. IMPRESSION: Cardiomegaly with vascular congestion and bibasilar atelectasis. Electronically Signed   By: Rolm Baptise M.D.   On: 10/05/2017 07:27   I have reviewed the images personally  DISCUSSION: 67 y/o male with an SMA occlusion requiring emergent bypass and eventually R hemicolectomy and ilestomy went back to operating room several times for washouts as well as working on abdominal closure with mesh.  Most recently had mesh placed.  Vfib arrest 6/11 & possible acute CVA w/ left sided hemiparesis.   ASSESSMENT / PLAN: Ischemic bowel, s/p resection with hemicolectomy and ileostomy; now status post several washouts.  Severe protein calorie malnutrition Plan Serial wound vac changes underway per CCS plans TF as ordered  Acute respiratory failure with hypoxemia with acute pulmonary edema  Status post tracheostomy 5/24 - transitioned down to ATC on 6/12 and tolerating well LLL PE, DVT H/O COPD Plan: OK to continue ATC ad lib [facility] Hopefully his secretion clearance, cough strength will improve. Currently being suctioned frequently. Does not appear to be a viscosity problem, no clear indication for mucolytics.  Continue bronchodilators Heparin drip for PE DVT > ? When it will be OK to transition to enteral anticoag, depends on the wound care, CCS  plans  Vfib arrest 6/11 - unclear etiology at this point Non-ST elevation MI- echo with severe hypokinesis and significantly reduced EF (40-45%)  New onset Afib 6/11 Shock -6/11 - resolved History of coronary artery disease, prior MI, hypertension and peripheral vascular disease Plan Cardiology following Metoprolol, ASA.  Will need left heart cath once medically stable.  Acute kidney injury--> improving Plan Follow UOP, BMP  Fluid and electrolyte imbalance: Hypernatremia/hyperchloremia-> improved Hypocalcemia: pseudo Plan: Free water as ordered; goal I = O  Acute encephalopathy w/ hypoactive delirium Left sided weakness- ? More pronounced after cardiac arrest overnight 6/11.  CT head negative. Plan RASS goal 0 Agree with d/c seroquel  Anemia of critical illness - No evidence of active bleeding currently Plan Follow CBC.   Hyperglycemia Plan SSI coverage  Vein Thromboses - DVT in RUE brachial vein, SVT in LUE basilic and cephalic veins, DVT in LLE common femoral and femoral veins, negative RLE. Plan Heparin gtt; consider change to enteral anti-coag as above  DVT prophylaxis: heparin gtt SUP: H2B IV for now Diet: TF Activity: bedrest  Disposition : ICU  FAMILY  - Updates: Sister, Bethena Roys called and updated 6/16 Stable for transfer to SDU   Baltazar Apo, MD, PhD 10/06/2017, 11:29 AM [facility] [phone] or if no answer [phone]

## 2017-10-06 NOTE — Progress Notes (Signed)
ANTICOAGULATION CONSULT NOTE Pharmacy Consult for Heparin Indication: pulmonary embolus  No Known Allergies  Patient Measurements: Height: 5\' 7"  (170.2 cm) Weight: 246 lb 8 oz (111.8 kg) IBW/kg (Calculated) : 66.1 Heparin Dosing Weight: 90 kg  Vital Signs: Temp: 98.4 F (36.9 C) (06/17 0900) Temp Source: Oral (06/17 0900) BP: 131/63 (06/17 0900) Pulse Rate: 92 (06/17 0900)  Labs: Recent Labs    10/04/17 0223 10/05/17 0712 10/06/17 0445  HGB 9.3* 8.2* 8.0*  HCT 32.5* 27.7* 27.3*  PLT 268 192 187  HEPARINUNFRC 0.46 0.49 0.45  CREATININE 1.38* 1.27* 1.22    Estimated Creatinine Clearance: 70.1 mL/min (by C-G formula based on SCr of 1.22 mg/dL).   Assessment: 67 y.o. male admitted 5/16 s/p multiple abdominal surgeries and tracheostomy, now s/p VFib arrest and found to have PE on IV heparin. Heparin currently therapeutic this morning at 0.45, CBC stable, no bleeding noted.  Goal of Therapy:  Heparin level 0.3-0.7 units/ml Monitor platelets by anticoagulation protocol: Yes   Plan:  Continue Heparin to 2600 units/hr Daily heparin level and CBC  Thank you Anette Guarneri, PharmD [phone] 10/06/2017

## 2017-10-06 NOTE — Consult Note (Addendum)
WOC note: Surgical team following for assessment and plan of care.WOC consult requested to begin abd Vac dressing changes on Wed. Julien Girt MSN, RN, [place], [place], [place]

## 2017-10-06 NOTE — Progress Notes (Signed)
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 Trached 5/24.  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 Plans to return to OR for dressing change  6/11- tolerating SBT/ off sedation; Vfib arrest that evening 6/12 VAC changed 6/14 Down size trach to 6 cuffed 6/17 TRH assumed care  Subjective: All active issues were addressed by PCCM today.  Pt is currently on [place], and will remain so, but no charge today as PCCM has addressed all active issues today.    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 + several washouts Serial wound vac changes underway per CCS plans  Severe protein calorie malnutrition TF as ordered  Acute hypoxic respiratory failure - acute pulmonary edema - s/p trach 5/24 continue ATC  LLL PE - DVT in RUE brachial vein, SVT in LUE basilic and cephalic veins, DVT in LLE common femoral and femoral veins Heparin drip for PE DVT - transition to enteral anticoag when stable from wound care standpoint  COPD  Vfib arrest 6/11 unclear etiology - Cards to consider cardiac cath when more stable   CAD / NSTEMI - TTE w/ severe hypokinesis and significantly reduced EF (40-45%)  Cardiology following - EF back to normal on f/u   New onset Afib  6/11  HTN  PVD  Acute kidney injury  Acute encephalopathy w/ hypoactive delirium  Left sided weakness More pronounced after cardiac arrest overnight 6/11 - CT head negative  Anemia of critical illness No evidence of active bleeding  Hyperglycemia SSI coverage  DVT prophylaxis: IV heparin  Code Status: FULL CODE Family Communication: no family present at time of exam  Disposition Plan: SDU  Consultants:  PCCM Gen Surgery  Cardiology   Antimicrobials:  5/15 zosyn > 5/19  5/19 ceftriaxone > 5/23     Objective: Blood pressure 138/72, pulse 91, temperature 98.4 F (36.9 C), temperature source Oral, resp. rate (!) 30, height 5\' 7"  (1.702 m), weight 111.8 kg (246 lb 8 oz), SpO2 99 %.  Intake/Output Summary (Last 24 hours) at 10/06/2017 1514 Last data filed at 10/06/2017 1500 Gross per 24 hour  Intake 3035.16 ml  Output 1725 ml  Net 1310.16 ml   Filed Weights   10/04/17 0500 10/05/17 0635 10/06/17 0438  Weight: 112.7 kg (248 lb 7.3 oz) 112.4 kg (247 lb 12.8 oz) 111.8 kg (246 lb 8 oz)    Examination: No exam today   CBC: Recent Labs  Lab 09/30/17 1134  10/01/17 0449  10/04/17 0223 10/05/17 0712 10/06/17 0445  WBC 7.7   < > 15.6*   < > 11.3* 8.2 6.4  NEUTROABS 5.9  --  13.5*  --   --   --   --   HGB 7.9*   < > 8.2*   < > 9.3* 8.2* 8.0*  HCT 26.4*   < > 26.7*   < >  32.5* 27.7* 27.3*  MCV 92.3   < > 92.4   < > 96.4 93.6 93.8  PLT 273   < > 297   < > 268 192 187   < > = values in this interval not displayed.   Basic Metabolic Panel: Recent Labs  Lab 10/03/17 0441  10/04/17 0223 10/05/17 0712 10/06/17 0445  NA 145   < > 147* 141 140  K 3.6   < > 4.4 3.8 3.7  CL 119*   < > 118* 114* 112*  CO2 20*   < > 21* 20* 21*  GLUCOSE 127*   < > 148* 119* 104*  BUN 27*   < > 26* 21* 20  CREATININE 1.36*   < > 1.38* 1.27* 1.22  CALCIUM 7.2*   < > 7.6* 7.1* 7.1*  MG 2.0  --   --  1.9 1.9  PHOS 2.0*  --   --  2.9 3.3   < > = values in this interval not  displayed.   GFR: Estimated Creatinine Clearance: 70.1 mL/min (by C-G formula based on SCr of 1.22 mg/dL).  Liver Function Tests: Recent Labs  Lab 09/30/17 1134 09/30/17 2220 10/01/17 0449  AST 28 54* 61*  ALT 24 30 37  ALKPHOS 81 117 109  BILITOT 0.5 0.7 0.9  PROT 5.9* 5.7* 5.4*  ALBUMIN 2.4* 2.8* 2.6*    Cardiac Enzymes: Recent Labs  Lab 09/30/17 2221 10/01/17 0449 10/01/17 0947  TROPONINI 0.06* 1.36* 1.74*    HbA1C: Hgb A1c MFr Bld  Date/Time Value Ref Range Status  05/17/2013 04:15 AM 5.7 (H) <5.7 % Final    Comment:    (NOTE)                                                                       According to the ADA Clinical Practice Recommendations for 2011, when HbA1c is used as a screening test:  >=6.5%   Diagnostic of Diabetes Mellitus           (if abnormal result is confirmed) 5.7-6.4%   Increased risk of developing Diabetes Mellitus References:Diagnosis and Classification of Diabetes Mellitus,Diabetes DGLO,7564,33(IRJJO 1):S62-S69 and Standards of Medical Care in         Diabetes - 2011,Diabetes Care,2011,34 (Suppl 1):S11-S61.    CBG: Recent Labs  Lab 10/05/17 2036 10/06/17 0013 10/06/17 0435 10/06/17 0820 10/06/17 1151  GLUCAP 104* 116* 107* 120* 116*    Recent Results (from the past 240 hour(s))  MRSA PCR Screening     Status: None   Collection Time: 10/06/17 11:24 AM  Result Value Ref Range Status   MRSA by PCR NEGATIVE NEGATIVE Final    Comment:        The GeneXpert MRSA Assay (FDA approved for NASAL specimens only), is one component of a comprehensive MRSA colonization surveillance program. It is not intended to diagnose MRSA infection nor to guide or monitor treatment for MRSA infections. Performed at [facility], [place] [address], [place], [place] [address]      Scheduled Meds: . acetaminophen  650 mg Oral Q6H  . aspirin  81 mg Per Tube Daily  . atorvastatin  80 mg Per Tube q1800  . chlorhexidine  15 mL  Mouth Rinse  BID  . Chlorhexidine Gluconate Cloth  6 each Topical Daily  . feeding supplement (PRO-STAT SUGAR FREE 64)  60 mL Per Tube BID  . free water  300 mL Per Tube Q6H  . HYDROmorphone  1 mg Oral Q4H  . insulin aspart  0-15 Units Subcutaneous Q4H  . loperamide HCl  4 mg Oral TID  . mouth rinse  15 mL Mouth Rinse q12n4p  . metoprolol tartrate  25 mg Per Tube BID  . sodium chloride flush  10-40 mL Intracatheter Q12H     LOS: 32 days   Cherene Altes, MD [facility] Office  [phone] Pager - Text Page per Shea Evans as per below:  On-Call/Text Page:      [email or web address]      password [email or web address]  If 7PM-7AM, please contact night-coverage [email or web address] Password [facility] 10/06/2017, 3:14 PM

## 2017-10-07 LAB — COMPREHENSIVE METABOLIC PANEL
ALT: 16 U/L — AB (ref 17–63)
AST: 26 U/L (ref 15–41)
Albumin: 1.9 g/dL — ABNORMAL LOW (ref 3.5–5.0)
Alkaline Phosphatase: 101 U/L (ref 38–126)
Anion gap: 7 (ref 5–15)
BUN: 20 mg/dL (ref 6–20)
CHLORIDE: 110 mmol/L (ref 101–111)
CO2: 21 mmol/L — AB (ref 22–32)
CREATININE: 1.2 mg/dL (ref 0.61–1.24)
Calcium: 7.2 mg/dL — ABNORMAL LOW (ref 8.9–10.3)
GFR calc non Af Amer: >60 mL/min (ref >60)
Glucose, Bld: 126 mg/dL — ABNORMAL HIGH (ref 65–99)
POTASSIUM: 3.9 mmol/L (ref 3.5–5.1)
SODIUM: 138 mmol/L (ref 135–145)
Total Bilirubin: 0.3 mg/dL (ref 0.3–1.2)
Total Protein: 5.2 g/dL — ABNORMAL LOW (ref 6.5–8.1)

## 2017-10-07 LAB — CBC
HCT: 26.8 % — ABNORMAL LOW (ref 39.0–52.0)
HEMOGLOBIN: 8 g/dL — AB (ref 13.0–17.0)
MCH: 27.2 pg (ref 26.0–34.0)
MCHC: 29.9 g/dL — AB (ref 30.0–36.0)
MCV: 91.2 fL (ref 78.0–100.0)
Platelets: 189 10*3/uL (ref 150–400)
RBC: 2.94 MIL/uL — AB (ref 4.22–5.81)
RDW: 15.7 % — ABNORMAL HIGH (ref 11.5–15.5)
WBC: 5.8 10*3/uL (ref 4.0–10.5)

## 2017-10-07 LAB — GLUCOSE, CAPILLARY
GLUCOSE-CAPILLARY: 107 mg/dL — AB (ref 65–99)
GLUCOSE-CAPILLARY: 115 mg/dL — AB (ref 65–99)
GLUCOSE-CAPILLARY: 117 mg/dL — AB (ref 65–99)
Glucose-Capillary: 118 mg/dL — ABNORMAL HIGH (ref 65–99)
Glucose-Capillary: 117 mg/dL — ABNORMAL HIGH (ref 65–99)
Glucose-Capillary: 113 mg/dL — ABNORMAL HIGH (ref 65–99)

## 2017-10-07 LAB — HEPARIN LEVEL (UNFRACTIONATED): HEPARIN UNFRACTIONATED: 0.61 [IU]/mL (ref 0.30–0.70)

## 2017-10-07 MED ORDER — LOPERAMIDE HCL 1 MG/7.5ML PO SUSP
4.0000 mg | Freq: Two times a day (BID) | ORAL | Status: DC
  Administered 2017-10-07 – 2017-10-08 (×3): 4 mg
  Filled 2017-10-07 (×3): qty 30

## 2017-10-07 MED ORDER — FAMOTIDINE 40 MG/5ML PO SUSR
20.0000 mg | Freq: Two times a day (BID) | ORAL | Status: DC
  Administered 2017-10-07 – 2017-10-08 (×2): 20 mg
  Filled 2017-10-07 (×5): qty 2.5

## 2017-10-07 MED ORDER — FREE WATER
200.0000 mL | Freq: Four times a day (QID) | Status: DC
  Administered 2017-10-08 (×3): 200 mL

## 2017-10-07 MED ORDER — LOPERAMIDE HCL 1 MG/7.5ML PO SUSP: 4.0000 mg | Freq: Two times a day (BID) | ORAL | Status: DC

## 2017-10-07 MED ORDER — LOPERAMIDE HCL 1 MG/7.5ML PO SUSP
4.0000 mg | Freq: Two times a day (BID) | ORAL | Status: DC
  Filled 2017-10-07: qty 30

## 2017-10-07 NOTE — Progress Notes (Signed)
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 Trached 5/24.  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  He is in no apparent resp distress nor is there evidence of uncontrolled pain.  He will follow simple commands.    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 + several washouts Serial wound vac changes underway per CCS plans  Severe protein calorie malnutrition TF as ordered via Cortrak   Acute hypoxic respiratory failure - acute pulmonary edema - s/p trach 5/24 continue ATC - trach care per PCCM - ongoing PMV training    LLL PE - DVT in RUE brachial vein, SVT in LUE basilic and cephalic veins, DVT in LLE common femoral and femoral veins Heparin drip - transition to enteral anticoag when stable from wound care standpoint  COPD  Vfib arrest 6/11 unclear etiology - Cards to consider cardiac cath when more stable   CAD / NSTEMI - TTE w/ severe hypokinesis and significantly reduced EF (40-45%)  Cardiology following -  EF back to normal on f/u TTE 10/03/17  New onset Afib 6/11 Presently in NSR - on heparin gtt   HTN BP well controlled   PVD  Acute kidney injury crt improved/stable  Acute encephalopathy w/ hypoactive delirium Appears to be slowly improving - will be better able to assess when consistently verbal  Left sided weakness More pronounced after cardiac arrest overnight 6/11 - CT head negative  Anemia of critical illness No evidence of active bleeding  Hyperglycemia CBG well controlled   DVT prophylaxis: IV heparin  Code Status: FULL CODE Family Communication: no family present at time of exam  Disposition Plan: SDU  Consultants:  PCCM Gen Surgery  Cardiology   Antimicrobials:  5/15 zosyn > 5/19  5/19 ceftriaxone > 5/23   Objective: Blood pressure 123/71, pulse 96, temperature 98.9 F (37.2 C), temperature source Oral, resp. rate (!) 31, height 5\' 7"  (1.702 m), weight 115.4 kg (254 lb 6.6 oz), SpO2 99 %.  Intake/Output Summary (Last 24 hours) at 10/07/2017 1622 Last data filed at 10/07/2017 1530 Gross per 24 hour  Intake 1278.67 ml  Output 1075 ml  Net 203.67 ml   Filed Weights   10/05/17 0635 10/06/17 0438 10/07/17 0437  Weight: 112.4 kg (247 lb 12.8 oz) 111.8 kg (246 lb 8 oz) 115.4 kg (254 lb 6.6 oz)    Examination: General: No acute respiratory distress on TC Lungs: Clear to auscultation bilaterally without wheezes or crackles Cardiovascular: Regular rate and rhythm without murmur gallop or rub normal S1 and S2 Abdomen: large midline wound w/ VAC dressing  in place - no rebound - soft  Extremities: 2+ LUE edema - 1+ edema RUE and B LE    CBC: Recent Labs  Lab 10/01/17 0449  10/05/17 0712 10/06/17 0445 10/07/17 0455  WBC 15.6*   < > 8.2 6.4 5.8  NEUTROABS 13.5*  --   --   --   --   HGB 8.2*   < > 8.2* 8.0* 8.0*  HCT 26.7*   < > 27.7* 27.3* 26.8*  MCV 92.4   < > 93.6 93.8 91.2  PLT 297   < > 192 187 189   < > = values in this interval not displayed.    Basic Metabolic Panel: Recent Labs  Lab 10/03/17 0441  10/05/17 0712 10/06/17 0445 10/07/17 0455  NA 145   < > 141 140 138  K 3.6   < > 3.8 3.7 3.9  CL 119*   < > 114* 112* 110  CO2 20*   < > 20* 21* 21*  GLUCOSE 127*   < > 119* 104* 126*  BUN 27*   < > 21* 20 20  CREATININE 1.36*   < > 1.27* 1.22 1.20  CALCIUM 7.2*   < > 7.1* 7.1* 7.2*  MG 2.0  --  1.9 1.9  --   PHOS 2.0*  --  2.9 3.3  --    < > = values in this interval not displayed.   GFR: Estimated Creatinine Clearance: 72.5 mL/min (by C-G formula based on SCr of 1.2 mg/dL).  Liver Function Tests: Recent Labs  Lab 09/30/17 2220 10/01/17 0449 10/07/17 0455  AST 54* 61* 26  ALT 30 37 16*  ALKPHOS 117 109 101  BILITOT 0.7 0.9 0.3  PROT 5.7* 5.4* 5.2*  ALBUMIN 2.8* 2.6* 1.9*    Cardiac Enzymes: Recent Labs  Lab 09/30/17 2221 10/01/17 0449 10/01/17 0947  TROPONINI 0.06* 1.36* 1.74*    HbA1C: Hgb A1c MFr Bld  Date/Time Value Ref Range Status  05/17/2013 04:15 AM 5.7 (H) <5.7 % Final    Comment:    (NOTE)                                                                       According to the ADA Clinical Practice Recommendations for 2011, when HbA1c is used as a screening test:  >=6.5%   Diagnostic of Diabetes Mellitus           (if abnormal result is confirmed) 5.7-6.4%   Increased risk of developing Diabetes Mellitus References:Diagnosis and Classification of Diabetes Mellitus,Diabetes SAYT,0160,10(XNATF 1):S62-S69 and Standards of Medical Care in         Diabetes - 2011,Diabetes Care,2011,34 (Suppl 1):S11-S61.    CBG: Recent Labs  Lab 10/06/17 2348 10/07/17 0432 10/07/17 0811 10/07/17 1121 10/07/17 1608  GLUCAP 108* 118* 115* 117* 113*    Recent Results (from the past 240 hour(s))  MRSA PCR Screening     Status: None   Collection Time: 10/06/17 11:24 AM  Result Value Ref Range Status   MRSA by PCR NEGATIVE NEGATIVE Final    Comment:        The GeneXpert MRSA Assay (FDA approved for  NASAL specimens only), is one component of a comprehensive MRSA colonization surveillance  program. It is not intended to diagnose MRSA infection nor to guide or monitor treatment for MRSA infections. Performed at [facility], [place] [address] [place], [place] [address]      Scheduled Meds: . acetaminophen  650 mg Per Tube Q6H  . aspirin  81 mg Per Tube Daily  . atorvastatin  80 mg Per Tube q1800  . chlorhexidine  15 mL Mouth Rinse BID  . Chlorhexidine Gluconate Cloth  6 each Topical Daily  . feeding supplement (PRO-STAT SUGAR FREE 64)  60 mL Per Tube BID  . free water  300 mL Per Tube Q6H  . HYDROmorphone  1 mg Per Tube Q4H  . insulin aspart  0-15 Units Subcutaneous Q4H  . loperamide HCl  4 mg Per Tube BID  . mouth rinse  15 mL Mouth Rinse q12n4p  . metoprolol tartrate  25 mg Per Tube BID  . sodium chloride flush  10-40 mL Intracatheter Q12H     LOS: 33 days   Cherene Altes, MD [facility] Office  [phone] Pager - Text Page per Shea Evans as per below:  On-Call/Text Page:      [email or web address]      password [email or web address]  If 7PM-7AM, please contact night-coverage [email or web address] Password [facility] 10/07/2017, 4:22 PM

## 2017-10-07 NOTE — Care Management Note (Addendum)
Case Management Note  Patient Details  Name: Luke Kaufman MRN: [id number] Date of Birth: 01/20/1951  Subjective/Objective: Pt presented 5/15 with abdominal pain, had an ischemic right colon. Had a R hemicolectomy and eventually an ileostomy. 5/22 returned to OR for attempted closure however unable to 2/2 current anatomy. 5-24 trach and 5-26 OR for debridement and dressing change. Wound VAC in place. PTA from home alone. Pt has support of sister, however sister is not willing to make any decisions for patient.                   Action/Plan: Pt was discussed in the [place]. Pt meets criteria for LTAC based on intensity of services being received in the hospital. CM did send authorization to [employer] to look at clinical to see if Insurance will approve LTAC transition. CM did discuss with patient- he did nod his head that LTAC is an option. CM not able to get choice at the time of visit. Pt has trach and Cortrak- difficultly speaking at this time. Sister had questions in regards to patient about if and when trach could be removed. MD would need to discuss with sister. Staff RN aware that sister has questions. CM will try to speak with patient again to obtain choice. No further needs at this time.    Expected Discharge Date:                  Expected Discharge Plan:  Long Term Acute Care (LTAC)  In-House Referral:  Clinical Social Work  Discharge planning Services  CM Consult  Post Acute Care Choice:  Long Term Acute Care (LTAC) Choice offered to:  Patient, Sibling(Spoke to patient's sister and she is not willing to make any decisions. )  DME Arranged:  N/A DME Agency:  NA  HH Arranged:  NA HH Agency:  NA  Status of Service:  In process, will continue to follow  If discussed at Long Length of Stay Meetings, dates discussed:    Additional Comments: 1308 10-09-17 Jacqlyn Krauss, RN,BSN [phone] Patient will transition to [facility] 10-09-17. RN to  call Report to [facility]. No further needs from CM at this time.    2458 10-08-17 Jacqlyn Krauss, RN, BSN [phone] CM had a chance to contact sister in regards to [facility] on 10-07-17. Sister thinks LTAC is a good plan. Sister feels like [facility] will be good 2/2 location. Pt is agreeable to transition to LTAC as well. CM did receive a call from [place]: pt was approved for LTAC. 7 days approved as of now and days can increase with updates. Authorization Number is [id number]. CM will contact MD to make him aware that LTAC has been authorized. No further needs from CM at this time.  Bethena Roys, RN 10/07/2017, 12:31 PM

## 2017-10-07 NOTE — Progress Notes (Signed)
ANTICOAGULATION CONSULT NOTE Pharmacy Consult for Heparin Indication: pulmonary embolus  No Known Allergies  Patient Measurements: Height: 5\' 7"  (170.2 cm) Weight: 254 lb 6.6 oz (115.4 kg) IBW/kg (Calculated) : 66.1 Heparin Dosing Weight: 90 kg  Vital Signs: Temp: 99.2 F (37.3 C) (06/18 0800) Temp Source: Oral (06/18 0800) BP: 134/71 (06/18 0800) Pulse Rate: 91 (06/18 0815)  Labs: Recent Labs    10/05/17 0712 10/06/17 0445 10/07/17 0455  HGB 8.2* 8.0* 8.0*  HCT 27.7* 27.3* 26.8*  PLT 192 187 189  HEPARINUNFRC 0.49 0.45 0.61  CREATININE 1.27* 1.22 1.20    Estimated Creatinine Clearance: 72.5 mL/min (by C-G formula based on SCr of 1.2 mg/dL).   Assessment: 67 y.o. male admitted 5/16 s/p multiple abdominal surgeries and tracheostomy, now s/p VFib arrest and found to have PE on IV heparin. Heparin currently therapeutic this morning at 0.61, CBC stable, no bleeding noted.  Goal of Therapy:  Heparin level 0.3-0.7 units/ml Monitor platelets by anticoagulation protocol: Yes   Plan:  Continue Heparin to 2600 units/hr Daily heparin level and CBC  Thank you Anette Guarneri, PharmD [phone] 10/07/2017

## 2017-10-07 NOTE — Progress Notes (Signed)
  Speech Language Pathology Treatment: [name] Speaking valve  Patient Details Name: Luke Kaufman MRN: [id number] DOB: Oct 08, 1950 Today's Date: 10/07/2017 Time: 3536-1443 SLP Time Calculation (min) (ACUTE ONLY): 21 min  Assessment / Plan / Recommendation Clinical Impression  Pt participated in treatment for Passy-Muir speaking valve with evident anxiety when RR and HR were in a stable range. Copious secretions upon slow cuff deflation with strong cough lasting intermittently 5 minutes with PMV placed in intervals lasting to one minute maximum. Called RN to deep suction however decided to remove valve and reinflate cuff. Initially RR up to 36 then in 20's once effectively cleared with coughs. He achieved low intensity, hoarse phonation with decreased intelligibility. Increased work of breathing and intermittent air trapping/back pressure with anxiety a factor. Will continue PMV with SLP only.    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 Fascia remains open, agitation barrier to weaning. Trached 5/24., with return to OR 5/26, 5/29 now with wound vac.        SLP Plan  Continue with current plan of care       Recommendations         Patient may use Passy-Muir Speech Valve: with SLP only PMSV Supervision: Full MD: Please consider changing trach tube to : Cuffless         Oral Care Recommendations: Oral care QID Follow up Recommendations: LTACH;Skilled Nursing facility SLP Visit Diagnosis: Aphonia (R49.1) Plan: Continue with current plan of care       GO                [name] 10/07/2017, 3:25 PM  Luke Kaufman Caroli[employer] [phone]

## 2017-10-07 NOTE — Progress Notes (Signed)
RN assessed a black spot on stoma. Notified on call physician with [facility]. No new orders given.

## 2017-10-07 NOTE — Progress Notes (Signed)
[facility]/Trauma Progress Note  14 Days Post-Op   Assessment/Plan Principal Problem:   Mesenteric ischemia (HCC) Active Problems:   Essential hypertension   Arterial atherosclerosis   HLD (hyperlipidemia)   Abdominal pain   PUD (peptic ulcer disease)   Occlusion of superior mesenteric artery (HCC)   Respiratory failure (HCC)   Acute respiratory failure with hypoxemia (HCC)   AKI (acute kidney injury) ([place])   Acute and chronic respiratory failure (acute-on-chronic) (HCC)   Cardiac arrest (HCC)   Elevated troponin   Mesenteric ischemia 1.Exploratory laparotomy right hemicolectomy 09/04/2017 Dr. Stark Klein 2.Abdominal exploration, application of temporary abdominal closure device(ABTHERA), 09/04/2017 Dr. Nadeen Landau 3.Oratory laparotomy, ileostomy, application of open abdominal wound VAC, 09/06/2017 Dr. Georganna Skeans 4. Open abdomen for attempted closure, 09/08/2017 Dr. Judeth Horn 5. Oratory laparotomy with possible wound closure(prior sutures upper&lower portion of the fascia had broken), 09/10/2017 Dr. Judeth Horn 6. Reexploration open abdominal wound, application ofABTHERAWOUND VAC,09/14/2017 Dr. Greer Pickerel 7. Exploratory laparotomy and partial closure of abdomen using the elastic tension closure device (ABRA), Dr. Hulen Skains, 05/29 8. exploratory laparotomy and dressing change, Dr. Ninfa Linden, 05/31 9. Changed negative pressure dressing, Dr. Kieth Brightly, 06/03 10.reopening of recent laparotomy, incisional hernia repair, insertion of mesh, placement of negative pressure dressing, Dr. Kieth Brightly, 06/04 11. Bedside vac change, Dr. Kieth Brightly, 06/07-6/17 [place] to take over on 6/19  - no fevers and WBC normal - extubated on trach collar - PE with brief cardiac arrest now on heparin, cards following - L sided weakness per medicine - tolerating TF's, stool output down with immodium  HGD:JMEQ feeds VTE: SCD's, heparin AS:TMHD  currently Foley:yes Follow up:TBD  DISPO:vac change M/W/Fat bedside by WOC, imodium down to BID    LOS: 33 days    Subjective: CC: abdominal pain  On trach collar. Unable to talk. Shook head yes when asked if he had abdominal pain. No issues overnight.   Objective: Vital signs in last 24 hours: Temp:  [97.8 F (36.6 C)-99.5 F (37.5 C)] 99.2 F (37.3 C) (06/18 0800) Pulse Rate:  [87-98] 91 (06/18 0815) Resp:  [24-33] 25 (06/18 0815) BP: (120-138)/(61-84) 134/71 (06/18 0800) SpO2:  [96 %-99 %] 99 % (06/18 0815) FiO2 (%):  [28 %] 28 % (06/18 0815) Weight:  [115.4 kg (254 lb 6.6 oz)] 115.4 kg (254 lb 6.6 oz) (06/18 0437) Last BM Date: (Pt has ileostomy)  Intake/Output from previous day: 06/17 0701 - 06/18 0700 In: 3137.7 [I.V.:546; QQ/IW:9798; IV Piggyback:106.7] Out: 1040 [Urine:300; Drains:265; Stool:475] Intake/Output this shift: No intake/output data recorded.  PE: Gen:On trach collar, awake, trying to talk Card:RRR Pulm:trach collar XQJ:JHERD vacwith tan output,noair leak.Tansemisolidstool in ostomy bag Neuro: following commands, moving R side not left Skin: warm and dry  Anti-infectives: Anti-infectives (From admission, onward)   Start     Dose/Rate Route Frequency Ordered Stop   10/04/17 0852  ceFAZolin (ANCEF) IVPB 1 g/50 mL premix     over 30 Minutes Intravenous Continuous PRN 10/04/17 0852 10/04/17 0852   10/04/17 0832  ceFAZolin (ANCEF) 2-4 GM/100ML-% IVPB    Note to Pharmacy:  Margaretmary Dys   : cabinet override      10/04/17 0832 10/04/17 2044   09/19/17 0730  ceFAZolin (ANCEF) IVPB 2g/100 mL premix     2 g 200 mL/hr over 30 Minutes Intravenous On call to O.R. 09/19/17 0715 09/19/17 1057   09/17/17 1145  ceFAZolin (ANCEF) 3 g in dextrose 5 % 50 mL IVPB     3 g 100 mL/hr over  30 Minutes Intravenous To [facility] 09/17/17 0923 09/17/17 1449   09/07/17 1545  cefTRIAXone (ROCEPHIN) 2 g in sodium chloride 0.9 % 100 mL IVPB     2  g 200 mL/hr over 30 Minutes Intravenous Every 24 hours 09/07/17 1543 09/11/17 1605   09/04/17 1400  piperacillin-tazobactam (ZOSYN) IVPB 3.375 g  Status:  Discontinued     3.375 g 12.5 mL/hr over 240 Minutes Intravenous Every 8 hours 09/04/17 1157 09/07/17 1541   09/04/17 1100  ceFAZolin (ANCEF) IVPB 2g/100 mL premix  Status:  Discontinued     2 g 200 mL/hr over 30 Minutes Intravenous Every 8 hours 09/04/17 1035 09/04/17 1157   09/04/17 0630  piperacillin-tazobactam (ZOSYN) IVPB 3.375 g  Status:  Discontinued     3.375 g 100 mL/hr over 30 Minutes Intravenous  Once 09/04/17 0617 09/04/17 0626   09/04/17 0619  piperacillin-tazobactam (ZOSYN) IVPB 3.375 g     3.375 g 100 mL/hr over 30 Minutes Intravenous 60 min pre-op 09/04/17 0620 09/04/17 0717   09/04/17 0030  piperacillin-tazobactam (ZOSYN) IVPB 3.375 g     3.375 g 100 mL/hr over 30 Minutes Intravenous  Once 09/04/17 0024 09/04/17 0144      Lab Results:  Recent Labs    10/06/17 0445 10/07/17 0455  WBC 6.4 5.8  HGB 8.0* 8.0*  HCT 27.3* 26.8*  PLT 187 189   BMET Recent Labs    10/06/17 0445 10/07/17 0455  NA 140 138  K 3.7 3.9  CL 112* 110  CO2 21* 21*  GLUCOSE 104* 126*  BUN 20 20  CREATININE 1.22 1.20  CALCIUM 7.1* 7.2*   PT/INR No results for input(s): LABPROT, INR in the last 72 hours. CMP     Component Value Date/Time   NA 138 10/07/2017 0455   K 3.9 10/07/2017 0455   CL 110 10/07/2017 0455   CO2 21 (L) 10/07/2017 0455   GLUCOSE 126 (H) 10/07/2017 0455   BUN 20 10/07/2017 0455   CREATININE 1.20 10/07/2017 0455   CALCIUM 7.2 (L) 10/07/2017 0455   PROT 5.2 (L) 10/07/2017 0455   ALBUMIN 1.9 (L) 10/07/2017 0455   AST 26 10/07/2017 0455   ALT 16 (L) 10/07/2017 0455   ALKPHOS 101 10/07/2017 0455   BILITOT 0.3 10/07/2017 0455   GFRNONAA >60 10/07/2017 0455   GFRAA >60 10/07/2017 0455   Lipase     Component Value Date/Time   LIPASE 37 09/03/2017 1644    Studies/Results: Dg Chest Port 1  View  Result Date: 10/06/2017 CLINICAL DATA:  Acute respiratory failure EXAM: PORTABLE CHEST 1 VIEW COMPARISON:  Yesterday FINDINGS: Tracheostomy tube is well seated. A feeding tube reaches the stomach at least. Right IJ line with tip at the upper cavoatrial junction. Stable heart size and mediastinal contours. Mildly improved aeration. There is still streaky density at the bases attributed atelectasis. No effusion or pneumothorax seen. IMPRESSION: 1. Mildly improved aeration. 2. Stable hardware positioning. Electronically Signed   By: Monte Fantasia M.D.   On: 10/06/2017 07:31      Kalman Drape , [facility] 10/07/2017, 9:02 AM  Pager: [phone] Mon-Wed, Friday 7:00am-4:30pm Thurs 7am-11:30am  Consults: [phone]

## 2017-10-07 NOTE — Progress Notes (Signed)
Nutrition Follow-up  DOCUMENTATION CODES:   Obesity unspecified  INTERVENTION:   Continue TF via Cortrak: - Osmolite 1.2'@65ml' /hr(1560 ml/day) - 60 ml Prostat BID  - Free water flushes per MD  Total regimenprovides: 2272 kcal, 146 grams protein, and 1279 ml free water, meeting 103% of kcal and 108% protein needs.  NUTRITION DIAGNOSIS:   Increased nutrient needs related to wound healing as evidenced by estimated needs.  Ongoing, being addressed via TF  GOAL:   Patient will meet greater than or equal to 90% of their needs  Met via TF  MONITOR:   TF tolerance, Skin, I & O's, Labs, Weight trends  REASON FOR ASSESSMENT:   Consult Enteral/tube feeding initiation and management  ASSESSMENT:   Pt with PMH of HTN, active smoker (2 PPD), CAD, MI, stroke, PAD who was admitted 5/15 with severe epigastric and periumbilical pain found to have ischemic R colon. Pt s/p ex lap with R hemicolectomy 5/16.   6/11 - off sedation, VF arrest, new onset HF 6/12 - extubated 6/14 - downsized trach to 6 cuffed 6/15 - placed on vent for tunneled CC placed in IR  Pt currently on trach collar. WOC now following for M/W/F wound VAC changes.  Met with pt at bedside. Pt responding to RD questions by smiling or shaking head "no." Osmolite 1.2 infusing via Cortrak at 65 ml/hr at time of RD visit.  Spoke with RN who reports pt continues to tolerate TF and continues to have watery output via ileostomy but that it has improved. Pt with 3 lb net weight gain since admission on 09/04/17. Since admission, pt's weight has fluctuated between 244-290 lbs. Suspect weight fluctuations related to fluid fluctuations and high ileostomy output.  Medications reviewed and include: 60 ml Pro-stat BID, 300 ml free water flushes q 6 hours, sliding scale Novolog, 4 mg Imodium BID, 10 mg IV Pepcid BID, Osmolite 1.2 @ 65 ml/hr  Labs reviewed: CO2 21 (L), calcium 7.2 (L), hemoglobin 8.0 (L), HCT 26.8 (L) CBG's: 117,  115, 118, 108, 112, 121 x 24 hours  UOP: 300 ml x 24 hours Wound VAC output: 265 ml x 24 hours Stool: 475 ml x 24 hours   Diet Order:   Diet Order           Diet NPO time specified  Diet effective now          EDUCATION NEEDS:   No education needs have been identified at this time  Skin:  Skin Assessment: Skin Integrity Issues: Skin Integrity Issues:: Wound VAC Wound Vac: abdomen  Last BM:  10/07/17 ileostomy  Height:   Ht Readings from Last 1 Encounters:  09/06/17 '5\' 7"'  (1.702 m)    Weight:   Wt Readings from Last 1 Encounters:  10/07/17 254 lb 6.6 oz (115.4 kg)    Ideal Body Weight:  67.2 kg  BMI:  Body mass index is 39.85 kg/m.  Estimated Nutritional Needs:   Kcal:  2000-2200 kcal/day  Protein:  120-135 grams/day (1-1.2 g/kg)  Fluid:  > 2 L/day    Gaynell Face, MS, RD, LDN Pager: [phone] Weekend/After Hours: [phone]

## 2017-10-08 DIAGNOSIS — E43 Unspecified severe protein-calorie malnutrition: Secondary | ICD-10-CM

## 2017-10-08 DIAGNOSIS — I214 Non-ST elevation (NSTEMI) myocardial infarction: Secondary | ICD-10-CM

## 2017-10-08 DIAGNOSIS — I82A11 Acute embolism and thrombosis of right axillary vein: Secondary | ICD-10-CM

## 2017-10-08 DIAGNOSIS — I480 Paroxysmal atrial fibrillation: Secondary | ICD-10-CM

## 2017-10-08 DIAGNOSIS — I2609 Other pulmonary embolism with acute cor pulmonale: Secondary | ICD-10-CM

## 2017-10-08 DIAGNOSIS — I824Z2 Acute embolism and thrombosis of unspecified deep veins of left distal lower extremity: Secondary | ICD-10-CM

## 2017-10-08 DIAGNOSIS — J810 Acute pulmonary edema: Secondary | ICD-10-CM

## 2017-10-08 DIAGNOSIS — I4901 Ventricular fibrillation: Secondary | ICD-10-CM

## 2017-10-08 DIAGNOSIS — J431 Panlobular emphysema: Secondary | ICD-10-CM

## 2017-10-08 LAB — CBC
HEMATOCRIT: 25.5 % — AB (ref 39.0–52.0)
Hemoglobin: 7.6 g/dL — ABNORMAL LOW (ref 13.0–17.0)
MCH: 27.5 pg (ref 26.0–34.0)
MCHC: 29.8 g/dL — AB (ref 30.0–36.0)
MCV: 92.4 fL (ref 78.0–100.0)
Platelets: 219 10*3/uL (ref 150–400)
RBC: 2.76 MIL/uL — ABNORMAL LOW (ref 4.22–5.81)
RDW: 15.8 % — ABNORMAL HIGH (ref 11.5–15.5)
WBC: 5.8 10*3/uL (ref 4.0–10.5)

## 2017-10-08 LAB — GLUCOSE, CAPILLARY
GLUCOSE-CAPILLARY: 111 mg/dL — AB (ref 65–99)
GLUCOSE-CAPILLARY: 115 mg/dL — AB (ref 65–99)
GLUCOSE-CAPILLARY: 119 mg/dL — AB (ref 65–99)
GLUCOSE-CAPILLARY: 85 mg/dL (ref 65–99)
GLUCOSE-CAPILLARY: 93 mg/dL (ref 65–99)
Glucose-Capillary: 122 mg/dL — ABNORMAL HIGH (ref 65–99)
Glucose-Capillary: 84 mg/dL (ref 65–99)

## 2017-10-08 LAB — COMPREHENSIVE METABOLIC PANEL
ALK PHOS: 100 U/L (ref 38–126)
ALT: 17 U/L (ref 17–63)
ANION GAP: 6 (ref 5–15)
AST: 26 U/L (ref 15–41)
Albumin: 1.8 g/dL — ABNORMAL LOW (ref 3.5–5.0)
BUN: 18 mg/dL (ref 6–20)
CALCIUM: 7.2 mg/dL — AB (ref 8.9–10.3)
CO2: 22 mmol/L (ref 22–32)
CREATININE: 1.2 mg/dL (ref 0.61–1.24)
Chloride: 108 mmol/L (ref 101–111)
Glucose, Bld: 114 mg/dL — ABNORMAL HIGH (ref 65–99)
Potassium: 4 mmol/L (ref 3.5–5.1)
SODIUM: 136 mmol/L (ref 135–145)
TOTAL PROTEIN: 5.2 g/dL — AB (ref 6.5–8.1)
Total Bilirubin: 0.4 mg/dL (ref 0.3–1.2)

## 2017-10-08 LAB — HEPARIN LEVEL (UNFRACTIONATED): Heparin Unfractionated: 0.62 IU/mL (ref 0.30–0.70)

## 2017-10-08 LAB — PREPARE RBC (CROSSMATCH)

## 2017-10-08 MED ORDER — LOPERAMIDE HCL 1 MG/7.5ML PO SUSP
4.0000 mg | Freq: Every day | ORAL | Status: DC
  Filled 2017-10-08: qty 30

## 2017-10-08 MED ORDER — SCOPOLAMINE 1 MG/3DAYS TD PT72
1.0000 | MEDICATED_PATCH | TRANSDERMAL | Status: DC
  Administered 2017-10-08: 1.5 mg via TRANSDERMAL
  Filled 2017-10-08: qty 1

## 2017-10-08 MED ORDER — METOPROLOL TARTRATE 5 MG/5ML IV SOLN
2.5000 mg | Freq: Once | INTRAVENOUS | Status: AC
  Administered 2017-10-08: 2.5 mg via INTRAVENOUS
  Filled 2017-10-08: qty 5

## 2017-10-08 MED ORDER — SODIUM CHLORIDE 0.9% IV SOLUTION: Freq: Once | INTRAVENOUS | Status: DC

## 2017-10-08 NOTE — Progress Notes (Signed)
Progress Note  Patient Name: Luke Kaufman Date of Encounter: 10/08/2017  Primary Cardiologist: Fransico Him, MD   Subjective   He is alert and attempts to answer questions, although it is difficult to determine his responses. He appears comfortable.   Inpatient Medications    Scheduled Meds: . acetaminophen  650 mg Per Tube Q6H  . aspirin  81 mg Per Tube Daily  . atorvastatin  80 mg Per Tube q1800  . chlorhexidine  15 mL Mouth Rinse BID  . Chlorhexidine Gluconate Cloth  6 each Topical Daily  . famotidine  20 mg Per Tube BID  . feeding supplement (PRO-STAT SUGAR FREE 64)  60 mL Per Tube BID  . free water  200 mL Per Tube Q6H  . HYDROmorphone  1 mg Per Tube Q4H  . insulin aspart  0-15 Units Subcutaneous Q4H  . loperamide HCl  4 mg Per Tube BID  . mouth rinse  15 mL Mouth Rinse q12n4p  . metoprolol tartrate  25 mg Per Tube BID  . sodium chloride flush  10-40 mL Intracatheter Q12H   Continuous Infusions: . sodium chloride Stopped (10/05/17 1405)  . feeding supplement (OSMOLITE 1.2 CAL) 65 mL/hr at 10/08/17 0600  . heparin 2,600 Units/hr (10/08/17 0305)   PRN Meds: fentaNYL (SUBLIMAZE) injection, glycopyrrolate, ipratropium-albuterol, pneumococcal 23 valent vaccine, sodium chloride flush   Vital Signs    Vitals:   10/08/17 0014 10/08/17 0404 10/08/17 0425 10/08/17 0726  BP: 115/67 136/69 136/69 134/67  Pulse: 85 93 90 91  Resp: (!) 29 (!) 27 (!) 22 (!) 21  Temp: (!) 100.4 F (38 C) 98.6 F (37 C)  98.9 F (37.2 C)  TempSrc: Axillary Axillary  Axillary  SpO2: 97% 99% 98% 99%  Weight:  255 lb 11.7 oz (116 kg)    Height:        Intake/Output Summary (Last 24 hours) at 10/08/2017 0823 Last data filed at 10/08/2017 0600 Gross per 24 hour  Intake 1692 ml  Output 400 ml  Net 1292 ml   Filed Weights   10/06/17 0438 10/07/17 0437 10/08/17 0404  Weight: 246 lb 8 oz (111.8 kg) 254 lb 6.6 oz (115.4 kg) 255 lb 11.7 oz (116 kg)    Telemetry    NSR with occasional  PACs - Personally Reviewed  Physical Exam   GEN: Ill appearing gentleman laying in bed in no acute distress.   Neck: No JVD Cardiac: RRR, no murmurs, rubs, or gallops.  Respiratory: mild rhonchi throughout GI: abdomen open with wound vac in place MS: 1-2+ LE edema; No deformity. Neuro:  Nonfocal, moving all extremities spontaneously Psych: mildly anxious   Labs    Chemistry Recent Labs  Lab 10/06/17 0445 10/07/17 0455 10/08/17 0500  NA 140 138 136  K 3.7 3.9 4.0  CL 112* 110 108  CO2 21* 21* 22  GLUCOSE 104* 126* 114*  BUN 20 20 18   CREATININE 1.22 1.20 1.20  CALCIUM 7.1* 7.2* 7.2*  PROT  --  5.2* 5.2*  ALBUMIN  --  1.9* 1.8*  AST  --  26 26  ALT  --  16* 17  ALKPHOS  --  101 100  BILITOT  --  0.3 0.4  GFRNONAA 60* >60 >60  GFRAA >60 >60 >60  ANIONGAP 7 7 6      Hematology Recent Labs  Lab 10/06/17 0445 10/07/17 0455 10/08/17 0500  WBC 6.4 5.8 5.8  RBC 2.91* 2.94* 2.76*  HGB 8.0* 8.0* 7.6*  HCT  27.3* 26.8* 25.5*  MCV 93.8 91.2 92.4  MCH 27.5 27.2 27.5  MCHC 29.3* 29.9* 29.8*  RDW 15.4 15.7* 15.8*  PLT 187 189 219    Cardiac Enzymes Recent Labs  Lab 10/01/17 0947  TROPONINI 1.74*   No results for input(s): TROPIPOC in the last 168 hours.   BNPNo results for input(s): BNP, PROBNP in the last 168 hours.   DDimer No results for input(s): DDIMER in the last 168 hours.   Radiology    No results found.  Cardiac Studies   Echo 10/01/17: Study Conclusions  - Left ventricle: Systolic function was mildly to moderately reduced. The estimated ejection fraction was in the range of 40% to 45%. Severe hypokinesis of the anteroseptal and anterior myocardium; consistent with ischemia or infarction in the distribution of the left anterior descending coronary artery. However, the apex appears to be spared. Due to tachycardia, there was fusion of early and atrial contributions to ventricular filling. The study is not technically sufficient  to allow evaluation of LV diastolic function. - Mitral valve: Calcified annulus.  Impressions:  - Very technically challenging study. Suggest an alternative imaging technique such as CT or MRI.  Echo 10/03/17: Study Conclusions  - Left ventricle: The cavity size was normal. Wall thickness was normal. Systolic function was normal. The estimated ejection fraction was in the range of 60% to 65%. Wall motion was normal; there were no regional wall motion abnormalities. The study is not technically sufficient to allow evaluation of LV diastolic function.    Patient Profile     67 y.o.malewith CAD status post medically managed MI in 2015, hypertension, hyperlipidemia, and ongoing tobacco abuse admitted 5/15 with severe abdominal pain. CT scan showed portal venous gas. He underwent mesenteric angiography that showed an ischemic right colon. He underwent emergent exploratory laparotomy with right hemicolectomy and ileostomy. Since that time he is undergone several exploratory laparotomies with failed attempts at closure of his abdomen. He had failure to wean off the ventilator and underwent tracheostomy on 09/12/2017. On 09/30/2017 he suffered VF arrest.  Assessment & Plan    1. VF arrest: initial echo post arrest with EF 40-45%, improved to 60-65% on repeat echo 6/14. He was found to have multiple DVTs/PE and is on IV heparin.  - Plan for LHC once medically stable - unlikely to perform cath while abdomen is open  2. NSTEMI in patient with known CAD: noted to have prior MI in 2015 without PCI. Trop initially elevated this admission to 0.5>0.29, thought to be due to demand ischemia in the setting of mesenteric ischemia. Trop peaked at 1.74 post cardiac arrest.  - Continue ASA, heparin, and atorvastatin - Plan for LHC once medically stable - unlikely to perform cath while abdomen is open  3. Paroxysmal atrial fibrillation: noted on 6/15 with post conversion pause of  2.26s. Continues to maintain NSR - Continue metoprolol and heparin  - Anticipate transition to a DOAC once medically stable  4. HTN: BP stable - Continue metoprolol  5. HLD:  - Continue statin (increased from home dose)  6. 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 Undergoing serial wound vac changes - Continue management per surgery team.  8. Acute respiratory failure: found to have PE; also with history of COPD. S/p trach - Continue management per primary  team/ pulmonary team    For questions or updates, please contact [place] Please consult [email or web address] for contact info under Cardiology/STEMI.      Signed, Abigail Butts, PA-C  10/08/2017, 8:23 AM   [phone]

## 2017-10-08 NOTE — Progress Notes (Signed)
Patient pulled out Cortrak tube. Cortrak team unable to place new tube this afternoon. Dr. Sherral Hammers is aware.

## 2017-10-08 NOTE — Plan of Care (Signed)
  Problem: Education: Goal: Knowledge of General Education information will improve Outcome: Progressing   Problem: Health Behavior/Discharge Planning: Goal: Ability to manage health-related needs will improve Outcome: Progressing   Problem: Clinical Measurements: Goal: Ability to maintain clinical measurements within normal limits will improve Outcome: Progressing   Problem: Clinical Measurements: Goal: Will remain free from infection Outcome: Progressing   Pt. Able to rest in intervals thru out the night. Even and unlabored breathing via Trach collar. Suctioned multiple times. Mouth care provided. PICC line dressing changed. Ostomy dressing and pouch changed. Wound vac in place, seal reinforced. VS stable. Hourly rounding completed. Bed locked and low. Call bell within reach. Bedside shift report will be given to oncoming nurse.

## 2017-10-08 NOTE — Progress Notes (Signed)
ANTICOAGULATION CONSULT NOTE Pharmacy Consult for Heparin Indication: pulmonary embolus   No Known Allergies  Patient Measurements: Height: 5\' 7"  (170.2 cm) Weight: 255 lb 11.7 oz (116 kg) IBW/kg (Calculated) : 66.1 Heparin Dosing Weight: 90 kg  Vital Signs: Temp: 99.1 F (37.3 C) (06/19 1139) Temp Source: Axillary (06/19 1139) BP: 122/64 (06/19 1506) Pulse Rate: 93 (06/19 1506)  Labs: Recent Labs    10/06/17 0445 10/07/17 0455 10/08/17 0500  HGB 8.0* 8.0* 7.6*  HCT 27.3* 26.8* 25.5*  PLT 187 189 219  HEPARINUNFRC 0.45 0.61 0.62  CREATININE 1.22 1.20 1.20    Estimated Creatinine Clearance: 72.7 mL/min (by C-G formula based on SCr of 1.2 mg/dL).   Assessment: 67 y.o. male admitted 5/16. He is  s/p multiple abdominal surgeries and tracheostomy,  s/p VFib arrest and found to have acute PE. Now on IV heparin. Heparin level currently therapeutic this morning at 0.62 on heparin rate 2600 units/hr. H/H stable, ptlc wnl. No bleeding noted.  POD #15 reopening of recent laparotomy, incisional hernia repair, insertion of mesh, placement of negative pressure dressing.     Goal of Therapy:  Heparin level 0.3-0.7 units/ml Monitor platelets by anticoagulation protocol: Yes   Plan:  Continue Heparin to 2600 units/hr Daily heparin level and CBC  Thank you Nicole Cella, [place] Pharmacist 8:00-3:30 PM: [phone] 3:30-10:00 PM: [phone] After 10PM, [place] [phone] 10/08/2017

## 2017-10-08 NOTE — Progress Notes (Signed)
Cortrak Tube Team Note:  Notified by SLP that bridle adhered to skin. RD removed bridle and secured Cortrak tube with NG clip at 92.  RN at bedside.   [place], [place], [place] Pager [phone] After Hours Pager

## 2017-10-08 NOTE — Progress Notes (Signed)
  Speech Language Pathology Treatment: [name] Speaking valve  Patient Details Name: Luke Kaufman MRN: [id number] DOB: 06/25/1950 Today's Date: 10/08/2017 Time: 6387-5643 SLP Time Calculation (min) (ACUTE ONLY): 16 min  Assessment / Plan / Recommendation Clinical Impression  Pt awakens easily from sleep and is cooperative but demonstrates anxiety with eyes widening, restless. Tolerated cuff deflation better today with minimal coughing, expectorating minimal from trach. Increased verbalization with 50% intelligibility in phrases if takes larger inhaled volumes with verbal cues. RR varied 23-33 with PMV donned and 30-31 doffed. Air trapping not present upon removal of valve. He has dyspnea likely due to anxiety. He is significantly confused with decreased semantics in verbal expression and is inconsistent following directions. Goal is to leave cuff delfated as it is not preventing secretions from being aspirated. Did not feel comfortable leaving cuff down despite strong cough; he cannot consistently and completely clear secretions in addition to cognitive impairments.   **Noted bridle for Cortrak appears to be adhered to his skin and potentially embedded. Moist washcloth was not successful in loosening tubing and pt in obvious pain when touched.RN notified and called Cortrak dietitian who says she will come look at it.   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 Fascia remains open, agitation barrier to weaning. Trached 5/24., with return to OR 5/26, 5/29 now with wound vac.        SLP Plan  Continue with current plan of care       Recommendations         Patient may use Passy-Muir Speech Valve: with SLP only PMSV Supervision: Full MD: Please consider changing trach tube to : Cuffless         Oral Care Recommendations: Oral care  QID Follow up Recommendations: LTACH;Skilled Nursing facility SLP Visit Diagnosis: Aphonia (R49.1) Plan: Continue with current plan of care       GO                [name] 10/08/2017, 9:56 AM  Orbie Pyo Colvin Caroli.[employer] [phone]

## 2017-10-08 NOTE — Progress Notes (Signed)
PROGRESS NOTE    Luke Kaufman  VPX:[id number] DOB: [birth year] DOA: 09/03/2017 PCP: Jefm Petty, MD   Brief Narrative:  67 y/o WM   Presented on 5/15 with abdominal pain and was found to have an ischemic right colon in the setting of a SMA occlusion requiring emergent bypass and eventually R hemicolectomy and ilestomy. Trached 5/24.     Subjective: 6/19 alert does not follow commands spontaneously moves extremities.  Just pulled out CorTrak tube    Assessment & Plan:   Principal Problem:   Mesenteric ischemia ([place]) Active Problems:   Essential hypertension   Arterial atherosclerosis   HLD (hyperlipidemia)   Abdominal pain   PUD (peptic ulcer disease)   Occlusion of superior mesenteric artery (HCC)   Respiratory failure (HCC)   Acute respiratory failure with hypoxemia (HCC)   AKI (acute kidney injury) (HCC)   Acute and chronic respiratory failure (acute-on-chronic) (HCC)   Cardiac arrest (HCC)   Elevated troponin   Mesenteric ischemia 1.  Exploratory laparotomy right hemicolectomy 09/04/2017 Dr. Stark Klein 2.  Abdominal exploration, application of temporary abdominal closure device (ABTHERA), 09/04/2017 Dr. Nadeen Landau 3.  Oratory laparotomy, ileostomy, application of open abdominal wound VAC, 09/06/2017 Dr. Georganna Skeans 4.  Open abdomen for attempted closure, 09/08/2017 Dr. Judeth Horn 5.  Oratory laparotomy with possible wound closure(prior sutures upper & lower portion of the fascia had broken), 09/10/2017 Dr. Judeth Horn 6.  Reexploration open abdominal wound, application of ABTHERA WOUND VAC, 09/14/2017 Dr. Greer Pickerel 7. Exploratory laparotomy and partial closure of abdomen using the elastic tension closure device (ABRA), Dr. Hulen Skains, 05/29 8. exploratory laparotomy and dressing change, Dr. Ninfa Linden, 05/31 9. Changed negative pressure dressing, Dr. Kieth Brightly, 06/03 10. reopening of recent laparotomy, incisional hernia repair, insertion of mesh, placement of  negative pressure dressing, Dr. Kieth Brightly, 06/04  11. Bedside vac change, Dr. Kieth Brightly, 06/07-6/17 [place] to take over on 6/19  Acute respiratory failure with hypoxia/pulmonary edema -5/24 S/P tracheostomy -Scopolamine patch placed secondary to copious secretions -Currently patient with confusion unable to participate in PMV trials  V. fib arrest -V. fib arrest 6/11: CPR performed for 11 minutes, defibrillation/cardioversion x2 with ROSC  -Cardiac catheterization per cardiology when more stable  CAD/NSTEMI/ -Echocardiogram EF 40 to 45% with severe hypokinesis: Repeat echocardiogram 6/14 EF back to normal -Strict in and out -Daily weight -Transfuse for hemoglobin <8 -6/19 transfuse 1 unit PRBC  New onset A. fib - Onset post V. fib arrest - Currently NSR - Heparin drip  Essential HTN -See NSTEMI   COPD   LLL PE/DVT RUE brachial vein/SVT LUE basilic and cephalic vein/DVT LLE, from wall and femoral veins - Heparin drip - Once CorTrak tube replaced would start patient on DMARD (Eliquis) would also clear with surgery before starting.  PVD   Acute kidney injury Recent Labs  Lab 10/03/17 0441 10/03/17 1954 10/04/17 0223 10/05/17 0712 10/06/17 0445 10/07/17 0455 10/08/17 0500  CREATININE 1.36* 1.40* 1.38* 1.27* 1.22 1.20 1.20  -Resolved  Acute encephalopathy with hypoactive delirium - Unsure patient's baseline.  Patient would not follow commands today.  Appears agitated.  In restraints -Residual left-sided weakness per epic note however patient unable to cooperate to evaluate -CT head 6/11 negative  Severe protein calorie malnutrition TF as ordered via Cortrak      Anemia of critical illness? -No overt evidence of bleed - Anemia panel pending -Occult blood pending    Hyperglycemia -Moderate SSI     DVT prophylaxis: Heparin drip Code Status: Full Family Communication:  None Disposition Plan: LTAC on 6/20?   Consultants:   [facility] PCCM    Procedures/Significant Events:  5/15 admit 5/16 mesenteric angiogram and aorto to SMA bypass 5/16 ex lap R hemicolectomy 5/18 ileostomy 5/19 abdominal washout and partial fascial closure 5/21 TF started, hypoxia with turning  5/22 Return to OR for exploration, attempted closure > unable 5/24 Trach 5/26 OR for debridement and dressing change 5/29 Exploratory laparotomy, placement of ABBRA closure system 5/31 return to OR for dressing change  6/11 tolerating SBT / off sedation; Vfib arrest  6/12 VAC changed 6/14 Down size trach to 6 cuffed 6/17 TRH assumed care    I have personally reviewed and interpreted all radiology studies and my findings are as above.  VENTILATOR SETTINGS: None   Cultures    Antimicrobials: Anti-infectives (From admission, onward)   Start     Stop   10/04/17 0852  ceFAZolin (ANCEF) IVPB 1 g/50 mL premix     10/04/17 0852   10/04/17 0832  ceFAZolin (ANCEF) 2-4 GM/100ML-% IVPB    Note to Pharmacy:  Margaretmary Dys   : cabinet override   10/04/17 2044   09/19/17 0730  ceFAZolin (ANCEF) IVPB 2g/100 mL premix     09/19/17 1057   09/17/17 1145  ceFAZolin (ANCEF) 3 g in dextrose 5 % 50 mL IVPB     09/17/17 1449   09/07/17 1545  cefTRIAXone (ROCEPHIN) 2 g in sodium chloride 0.9 % 100 mL IVPB     09/11/17 1605   09/04/17 1400  piperacillin-tazobactam (ZOSYN) IVPB 3.375 g  Status:  Discontinued     09/07/17 1541   09/04/17 1100  ceFAZolin (ANCEF) IVPB 2g/100 mL premix  Status:  Discontinued     09/04/17 1157   09/04/17 0630  piperacillin-tazobactam (ZOSYN) IVPB 3.375 g  Status:  Discontinued     09/04/17 0626   09/04/17 0619  piperacillin-tazobactam (ZOSYN) IVPB 3.375 g     09/04/17 0717   09/04/17 0030  piperacillin-tazobactam (ZOSYN) IVPB 3.375 g     09/04/17 0144       Devices    LINES / TUBES:      Continuous Infusions: . sodium chloride Stopped (10/05/17 1405)  . feeding supplement (OSMOLITE 1.2 CAL)  Stopped (10/08/17 1527)  . heparin 2,600 Units/hr (10/08/17 1500)     Objective: Vitals:   10/08/17 0846 10/08/17 1132 10/08/17 1139 10/08/17 1506  BP: 134/67 131/61 130/63 122/64  Pulse: 99 87 90 93  Resp: (!) 28 (!) 24 (!) 23 (!) 27  Temp:   99.1 F (37.3 C)   TempSrc:   Axillary   SpO2: 99% 99% 99% 98%  Weight:      Height:        Intake/Output Summary (Last 24 hours) at 10/08/2017 1556 Last data filed at 10/08/2017 1527 Gross per 24 hour  Intake 2791.13 ml  Output 250 ml  Net 2541.13 ml   Filed Weights   10/06/17 0438 10/07/17 0437 10/08/17 0404  Weight: 246 lb 8 oz (111.8 kg) 254 lb 6.6 oz (115.4 kg) 255 lb 11.7 oz (116 kg)    Examination:  General: Open, does not follow commands, positive acute respiratory distress (on trach collar) Eyes: negative scleral hemorrhage, negative anisocoria, negative icterus Neck:  Negative scars, masses, torticollis, lymphadenopathy, JVD, #6 cuffed trach in place copious secretions thick but clear Lungs: diffuse rhonchi, negative wheeze, negative crackles  Cardiovascular: Regular rate and rhythm without murmur gallop or rub normal S1 and S2 Abdomen: Obese, abdominal  wound covered with wound VAC, minimum serosanguineous fluid drainage into container  positive soft, bowel sounds, no rebound, no ascites, no appreciable mass Extremities: No significant cyanosis, clubbing, or edema bilateral lower extremities Skin: Negative rashes, lesions, ulcers Psychiatric: Unable to evaluate secondary to altered mental status and trach  Central nervous system: Does not follow commands unable to evaluate secondary to altered mental status and trach   .     Data Reviewed: Care during the described time interval was provided by me .  I have reviewed this patient's available data, including medical history, events of note, physical examination, and all test results as part of my evaluation.   CBC: Recent Labs  Lab 10/04/17 0223 10/05/17 0712  10/06/17 0445 10/07/17 0455 10/08/17 0500  WBC 11.3* 8.2 6.4 5.8 5.8  HGB 9.3* 8.2* 8.0* 8.0* 7.6*  HCT 32.5* 27.7* 27.3* 26.8* 25.5*  MCV 96.4 93.6 93.8 91.2 92.4  PLT 268 192 187 189 161   Basic Metabolic Panel: Recent Labs  Lab 10/02/17 0515 10/03/17 0441  10/04/17 0223 10/05/17 0712 10/06/17 0445 10/07/17 0455 10/08/17 0500  NA 150* 145   < > 147* 141 140 138 136  K 3.4* 3.6   < > 4.4 3.8 3.7 3.9 4.0  CL 118* 119*   < > 118* 114* 112* 110 108  CO2 22 20*   < > 21* 20* 21* 21* 22  GLUCOSE 134* 127*   < > 148* 119* 104* 126* 114*  BUN 33* 27*   < > 26* 21* 20 20 18   CREATININE 1.39* 1.36*   < > 1.38* 1.27* 1.22 1.20 1.20  CALCIUM 7.5* 7.2*   < > 7.6* 7.1* 7.1* 7.2* 7.2*  MG 2.3 2.0  --   --  1.9 1.9  --   --   PHOS 2.8 2.0*  --   --  2.9 3.3  --   --    < > = values in this interval not displayed.   GFR: Estimated Creatinine Clearance: 72.7 mL/min (by C-G formula based on SCr of 1.2 mg/dL). Liver Function Tests: Recent Labs  Lab 10/07/17 0455 10/08/17 0500  AST 26 26  ALT 16* 17  ALKPHOS 101 100  BILITOT 0.3 0.4  PROT 5.2* 5.2*  ALBUMIN 1.9* 1.8*   No results for input(s): LIPASE, AMYLASE in the last 168 hours. No results for input(s): AMMONIA in the last 168 hours. Coagulation Profile: No results for input(s): INR, PROTIME in the last 168 hours. Cardiac Enzymes: No results for input(s): CKTOTAL, CKMB, CKMBINDEX, TROPONINI in the last 168 hours. BNP (last 3 results) No results for input(s): PROBNP in the last 8760 hours. HbA1C: No results for input(s): HGBA1C in the last 72 hours. CBG: Recent Labs  Lab 10/07/17 2332 10/08/17 0022 10/08/17 0403 10/08/17 0724 10/08/17 1137  GLUCAP 107* 119* 115* 111* 122*   Lipid Profile: No results for input(s): CHOL, HDL, LDLCALC, TRIG, CHOLHDL, LDLDIRECT in the last 72 hours. Thyroid Function Tests: No results for input(s): TSH, T4TOTAL, FREET4, T3FREE, THYROIDAB in the last 72 hours. Anemia Panel: No results  for input(s): VITAMINB12, FOLATE, FERRITIN, TIBC, IRON, RETICCTPCT in the last 72 hours. Urine analysis:    Component Value Date/Time   COLORURINE YELLOW 09/03/2017 2211   APPEARANCEUR CLEAR 09/03/2017 2211   LABSPEC 1.032 (H) 09/03/2017 2211   PHURINE 5.0 09/03/2017 2211   GLUCOSEU NEGATIVE 09/03/2017 2211   HGBUR NEGATIVE 09/03/2017 2211   [place] 09/03/2017 2211   KETONESUR NEGATIVE 09/03/2017  [place] 09/03/2017 2211   UROBILINOGEN 0.2 05/17/2013 0307   NITRITE NEGATIVE 09/03/2017 2211   LEUKOCYTESUR NEGATIVE 09/03/2017 2211   Sepsis Labs: @LABRCNTIP (procalcitonin:4,lacticidven:4)  ) Recent Results (from the past 240 hour(s))  MRSA PCR Screening     Status: None   Collection Time: 10/06/17 11:24 AM  Result Value Ref Range Status   MRSA by PCR NEGATIVE NEGATIVE Final    Comment:        The GeneXpert MRSA Assay (FDA approved for NASAL specimens only), is one component of a comprehensive MRSA colonization surveillance program. It is not intended to diagnose MRSA infection nor to guide or monitor treatment for MRSA infections. Performed at [facility], [place] [address] [place], [place] [address]          Radiology Studies: No results found.      Scheduled Meds: . acetaminophen  650 mg Per Tube Q6H  . aspirin  81 mg Per Tube Daily  . atorvastatin  80 mg Per Tube q1800  . chlorhexidine  15 mL Mouth Rinse BID  . Chlorhexidine Gluconate Cloth  6 each Topical Daily  . famotidine  20 mg Per Tube BID  . feeding supplement (PRO-STAT SUGAR FREE 64)  60 mL Per Tube BID  . free water  200 mL Per Tube Q6H  . HYDROmorphone  1 mg Per Tube Q4H  . insulin aspart  0-15 Units Subcutaneous Q4H  . [START ON 10/09/2017] loperamide HCl  4 mg Per Tube Daily  . mouth rinse  15 mL Mouth Rinse q12n4p  . metoprolol tartrate  25 mg Per Tube BID  . sodium chloride flush  10-40 mL Intracatheter Q12H   Continuous Infusions: . sodium chloride  Stopped (10/05/17 1405)  . feeding supplement (OSMOLITE 1.2 CAL) Stopped (10/08/17 1527)  . heparin 2,600 Units/hr (10/08/17 1500)     LOS: 34 days    Time spent: 40 minutes    WOODS, Geraldo Docker, MD [facility] Pager [phone]   If 7PM-7AM, please contact night-coverage [email or web address] Password [facility] 10/08/2017, 3:56 PM

## 2017-10-08 NOTE — Progress Notes (Signed)
Patient ID: Luke Kaufman, male   DOB: [birth year], 67 y.o.   MRN: [id number]    15 Days Post-Op  Subjective: Sleeping on trach collar.  Objective: Vital signs in last 24 hours: Temp:  [98.6 F (37 C)-100.4 F (38 C)] 98.9 F (37.2 C) (06/19 0726) Pulse Rate:  [85-99] 99 (06/19 0846) Resp:  [21-31] 28 (06/19 0846) BP: (103-146)/(62-77) 134/67 (06/19 0846) SpO2:  [97 %-99 %] 99 % (06/19 0846) FiO2 (%):  [28 %] 28 % (06/19 0846) Weight:  [116 kg (255 lb 11.7 oz)] 116 kg (255 lb 11.7 oz) (06/19 0404) Last BM Date: 10/07/17  Intake/Output from previous day: 06/18 0701 - 06/19 0700 In: 1692 [I.V.:312; NG/GT:1380] Out: 400 [Drains:150; Stool:250] Intake/Output this shift: Total I/O In: 338 [P.O.:240; I.V.:98] Out: 250 [Drains:150; Stool:100]  PE: Abd: stable, VAC removed.  See picture below.  Measurements today were obtained by WOC at dressing change. 28x27x1cm.  Some cloudy output in cannister, but wound looks clean and no obvious evidence of infection.     Lab Results:  Recent Labs    10/07/17 0455 10/08/17 0500  WBC 5.8 5.8  HGB 8.0* 7.6*  HCT 26.8* 25.5*  PLT 189 219   BMET Recent Labs    10/07/17 0455 10/08/17 0500  NA 138 136  K 3.9 4.0  CL 110 108  CO2 21* 22  GLUCOSE 126* 114*  BUN 20 18  CREATININE 1.20 1.20  CALCIUM 7.2* 7.2*   PT/INR No results for input(s): LABPROT, INR in the last 72 hours. CMP     Component Value Date/Time   NA 136 10/08/2017 0500   K 4.0 10/08/2017 0500   CL 108 10/08/2017 0500   CO2 22 10/08/2017 0500   GLUCOSE 114 (H) 10/08/2017 0500   BUN 18 10/08/2017 0500   CREATININE 1.20 10/08/2017 0500   CALCIUM 7.2 (L) 10/08/2017 0500   PROT 5.2 (L) 10/08/2017 0500   ALBUMIN 1.8 (L) 10/08/2017 0500   AST 26 10/08/2017 0500   ALT 17 10/08/2017 0500   ALKPHOS 100 10/08/2017 0500   BILITOT 0.4 10/08/2017 0500   GFRNONAA >60 10/08/2017 0500   GFRAA >60 10/08/2017 0500   Lipase     Component Value Date/Time   LIPASE 37  09/03/2017 1644       Studies/Results: No results found.  Anti-infectives: Anti-infectives (From admission, onward)   Start     Dose/Rate Route Frequency Ordered Stop   10/04/17 0852  ceFAZolin (ANCEF) IVPB 1 g/50 mL premix     over 30 Minutes Intravenous Continuous PRN 10/04/17 0852 10/04/17 0852   10/04/17 0832  ceFAZolin (ANCEF) 2-4 GM/100ML-% IVPB    Note to Pharmacy:  Margaretmary Dys   : cabinet override      10/04/17 0832 10/04/17 2044   09/19/17 0730  ceFAZolin (ANCEF) IVPB 2g/100 mL premix     2 g 200 mL/hr over 30 Minutes Intravenous On call to O.R. 09/19/17 0715 09/19/17 1057   09/17/17 1145  ceFAZolin (ANCEF) 3 g in dextrose 5 % 50 mL IVPB     3 g 100 mL/hr over 30 Minutes Intravenous To [facility]Stay [facility] 09/17/17 0923 09/17/17 1449   09/07/17 1545  cefTRIAXone (ROCEPHIN) 2 g in sodium chloride 0.9 % 100 mL IVPB     2 g 200 mL/hr over 30 Minutes Intravenous Every 24 hours 09/07/17 1543 09/11/17 1605   09/04/17 1400  piperacillin-tazobactam (ZOSYN) IVPB 3.375 g  Status:  Discontinued     3.375 g 12.5 mL/hr  over 240 Minutes Intravenous Every 8 hours 09/04/17 1157 09/07/17 1541   09/04/17 1100  ceFAZolin (ANCEF) IVPB 2g/100 mL premix  Status:  Discontinued     2 g 200 mL/hr over 30 Minutes Intravenous Every 8 hours 09/04/17 1035 09/04/17 1157   09/04/17 0630  piperacillin-tazobactam (ZOSYN) IVPB 3.375 g  Status:  Discontinued     3.375 g 100 mL/hr over 30 Minutes Intravenous  Once 09/04/17 0617 09/04/17 0626   09/04/17 0619  piperacillin-tazobactam (ZOSYN) IVPB 3.375 g     3.375 g 100 mL/hr over 30 Minutes Intravenous 60 min pre-op 09/04/17 0620 09/04/17 0717   09/04/17 0030  piperacillin-tazobactam (ZOSYN) IVPB 3.375 g     3.375 g 100 mL/hr over 30 Minutes Intravenous  Once 09/04/17 0024 09/04/17 0144       Assessment/Plan Principal Problem:   Mesenteric ischemia (HCC) Active Problems:   Essential hypertension   Arterial atherosclerosis   HLD  (hyperlipidemia)   Abdominal pain   PUD (peptic ulcer disease)   Occlusion of superior mesenteric artery (HCC)   Respiratory failure (HCC)   Acute respiratory failure with hypoxemia (HCC)   AKI (acute kidney injury) ([place])   Acute and chronic respiratory failure (acute-on-chronic) (HCC)   Cardiac arrest (HCC)   Elevated troponin   Mesenteric ischemia 1.Exploratory laparotomy right hemicolectomy 09/04/2017 Dr. Stark Klein 2.Abdominal exploration, application of temporary abdominal closure device(ABTHERA), 09/04/2017 Dr. Nadeen Landau 3.Oratory laparotomy, ileostomy, application of open abdominal wound VAC, 09/06/2017 Dr. Georganna Skeans 4. Open abdomen for attempted closure, 09/08/2017 Dr. Judeth Horn 5. Oratory laparotomy with possible wound closure(prior sutures upper&lower portion of the fascia had broken), 09/10/2017 Dr. Judeth Horn 6. Reexploration open abdominal wound, application ofABTHERAWOUND VAC,09/14/2017 Dr. Greer Pickerel 7. Exploratory laparotomy and partial closure of abdomen using the elastic tension closure device (ABRA), Dr. Hulen Skains, 05/29 8. exploratory laparotomy and dressing change, Dr. Ninfa Linden, 05/31 9. Changed negative pressure dressing, Dr. Kieth Brightly, 06/03 10.reopening of recent laparotomy, incisional hernia repair, insertion of mesh, placement of negative pressure dressing, Dr. Kieth Brightly, 06/04 11. Bedside vac change, Dr. Kieth Brightly, 06/07-6/17 [place] to take over on 6/19  - temp of 100.4 overnight, but WBC normal - on trach collar - PE with brief cardiac arrest now on heparin, cards following - L sided weakness per medicine - tolerating TF's, stool output down with imodium, but only 250cc out yesterday.  Will decrease imodium to 4mg  daily to avoid constipation  XNT:ZGYF feeds VTE: SCD's, heparin VC:BSWH currently Foley:yes Follow up:TBD  DISPO:vac change M/W/Fat bedside by WOC, imodium down daily     LOS: 34 days    Henreitta Cea  , [facility] 10/08/2017, 10:15 AM Pager: [phone]

## 2017-10-08 NOTE — Progress Notes (Signed)
Pt. resting in bed with eyes closed after suctioned by RT. Responds to verbal stimuli. Even and unlabored breathing via Trach collar. Telemetry and SpO2 monitoring in place. Wound vac in place. Seal intact. Tube feeding infusing at goal rate. Water flush administered. VS stable. Temperature noted. Schedule Tylenol given. Bed locked and low. Call bell within reach.

## 2017-10-08 NOTE — Consult Note (Addendum)
[place] Nurse wound consult note Reason for Consult: Consult requested to change abd Vac.  Surgery is following for assessment and plan of care and PA at the bedside to assess wound appearance. Wound type: Full thickness post-op abd wound Measurement: 28X27X1cm Wound bed: 100% mesh over open wound; red moist woundbed underneath Drainage (amount, consistency, odor) Mod amt cloudy red-tinged liquid in the cannister; new cannister applied. Periwound: Red macerated skin surrounding the wound to 3 cm, bleeds small amt when dressing was changed.  Applied barrier ring to attempt to seal around lower skin fold, then 2 pieces Mepitel, 5 pieces white foam, and 2 pieces black foam to 151mm cont suction.  Pt was medicated prior to the procedure and tolerated without apparent discomfort. Dressing procedure/placement/frequency: WOC will plan to change dressing Q M/W/F.  WOC ostomy follow-up: Stoma type/location:Ileostomy stoma to RLQ; Red and moist, 1 1/2 inches, 1 1/2 inches, flush with skin level. Output: Large amt brown liquid stool Ostomy pouching:2 piece pouching system applied with barrier ring to attempt to maintain a seal, attached high output spout to bedside drainage bag.Supplies at the bedside for staff nurse use. Education provided:No family present and pt is critically ill. Enrolled patient in [place] Start Discharge program: No WOC team will continue to follow and begin teaching sessions when stable.  He was groggy and did not participate or watch the process. Expect he will need total assistance with pouch application and emptying after discharge. Julien Girt MSN, RN, [place], [place], [place]

## 2017-10-09 ENCOUNTER — Other Ambulatory Visit (HOSPITAL_COMMUNITY): Payer: Self-pay

## 2017-10-09 ENCOUNTER — Inpatient Hospital Stay (HOSPITAL_COMMUNITY): Payer: PPO

## 2017-10-09 ENCOUNTER — Inpatient Hospital Stay
Admission: AD | Admit: 2017-10-09 | Discharge: 2017-11-07 | Disposition: A | Discharge status: Skilled Nursing Facility | Payer: PPO | Source: Ambulatory Visit | Attending: Internal Medicine | Admitting: Internal Medicine

## 2017-10-09 DIAGNOSIS — R0989 Other specified symptoms and signs involving the circulatory and respiratory systems: Secondary | ICD-10-CM | POA: Diagnosis not present

## 2017-10-09 DIAGNOSIS — F339 Major depressive disorder, recurrent, unspecified: Secondary | ICD-10-CM | POA: Diagnosis not present

## 2017-10-09 DIAGNOSIS — Z0189 Encounter for other specified special examinations: Secondary | ICD-10-CM

## 2017-10-09 DIAGNOSIS — R29818 Other symptoms and signs involving the nervous system: Secondary | ICD-10-CM | POA: Diagnosis not present

## 2017-10-09 DIAGNOSIS — G894 Chronic pain syndrome: Secondary | ICD-10-CM | POA: Diagnosis not present

## 2017-10-09 DIAGNOSIS — Z430 Encounter for attention to tracheostomy: Secondary | ICD-10-CM | POA: Diagnosis not present

## 2017-10-09 DIAGNOSIS — I82621 Acute embolism and thrombosis of deep veins of right upper extremity: Secondary | ICD-10-CM | POA: Diagnosis not present

## 2017-10-09 DIAGNOSIS — D500 Iron deficiency anemia secondary to blood loss (chronic): Secondary | ICD-10-CM | POA: Diagnosis not present

## 2017-10-09 DIAGNOSIS — I2699 Other pulmonary embolism without acute cor pulmonale: Secondary | ICD-10-CM

## 2017-10-09 DIAGNOSIS — J90 Pleural effusion, not elsewhere classified: Secondary | ICD-10-CM | POA: Diagnosis not present

## 2017-10-09 DIAGNOSIS — R1310 Dysphagia, unspecified: Secondary | ICD-10-CM | POA: Diagnosis not present

## 2017-10-09 DIAGNOSIS — E7849 Other hyperlipidemia: Secondary | ICD-10-CM | POA: Diagnosis not present

## 2017-10-09 DIAGNOSIS — E46 Unspecified protein-calorie malnutrition: Secondary | ICD-10-CM | POA: Diagnosis not present

## 2017-10-09 DIAGNOSIS — E43 Unspecified severe protein-calorie malnutrition: Secondary | ICD-10-CM | POA: Diagnosis not present

## 2017-10-09 DIAGNOSIS — Z9049 Acquired absence of other specified parts of digestive tract: Secondary | ICD-10-CM | POA: Diagnosis not present

## 2017-10-09 DIAGNOSIS — T148XXA Other injury of unspecified body region, initial encounter: Secondary | ICD-10-CM

## 2017-10-09 DIAGNOSIS — I10 Essential (primary) hypertension: Secondary | ICD-10-CM | POA: Diagnosis not present

## 2017-10-09 DIAGNOSIS — J9621 Acute and chronic respiratory failure with hypoxia: Secondary | ICD-10-CM | POA: Diagnosis not present

## 2017-10-09 DIAGNOSIS — G8194 Hemiplegia, unspecified affecting left nondominant side: Secondary | ICD-10-CM

## 2017-10-09 DIAGNOSIS — J9690 Respiratory failure, unspecified, unspecified whether with hypoxia or hypercapnia: Secondary | ICD-10-CM | POA: Diagnosis not present

## 2017-10-09 DIAGNOSIS — I214 Non-ST elevation (NSTEMI) myocardial infarction: Secondary | ICD-10-CM

## 2017-10-09 DIAGNOSIS — M6281 Muscle weakness (generalized): Secondary | ICD-10-CM | POA: Diagnosis not present

## 2017-10-09 DIAGNOSIS — I824Z2 Acute embolism and thrombosis of unspecified deep veins of left distal lower extremity: Secondary | ICD-10-CM

## 2017-10-09 DIAGNOSIS — R0602 Shortness of breath: Secondary | ICD-10-CM | POA: Diagnosis not present

## 2017-10-09 DIAGNOSIS — T82837A Hemorrhage of cardiac prosthetic devices, implants and grafts, initial encounter: Secondary | ICD-10-CM | POA: Diagnosis not present

## 2017-10-09 DIAGNOSIS — I469 Cardiac arrest, cause unspecified: Secondary | ICD-10-CM

## 2017-10-09 DIAGNOSIS — R531 Weakness: Secondary | ICD-10-CM | POA: Diagnosis not present

## 2017-10-09 DIAGNOSIS — Z9911 Dependence on respirator [ventilator] status: Secondary | ICD-10-CM | POA: Diagnosis not present

## 2017-10-09 DIAGNOSIS — N178 Other acute kidney failure: Secondary | ICD-10-CM | POA: Diagnosis not present

## 2017-10-09 DIAGNOSIS — I82623 Acute embolism and thrombosis of deep veins of upper extremity, bilateral: Secondary | ICD-10-CM | POA: Diagnosis not present

## 2017-10-09 DIAGNOSIS — E440 Moderate protein-calorie malnutrition: Secondary | ICD-10-CM | POA: Diagnosis not present

## 2017-10-09 DIAGNOSIS — M7989 Other specified soft tissue disorders: Secondary | ICD-10-CM | POA: Diagnosis not present

## 2017-10-09 DIAGNOSIS — D62 Acute posthemorrhagic anemia: Secondary | ICD-10-CM | POA: Diagnosis not present

## 2017-10-09 DIAGNOSIS — K869 Disease of pancreas, unspecified: Secondary | ICD-10-CM | POA: Diagnosis not present

## 2017-10-09 DIAGNOSIS — G9349 Other encephalopathy: Secondary | ICD-10-CM | POA: Diagnosis not present

## 2017-10-09 DIAGNOSIS — D649 Anemia, unspecified: Secondary | ICD-10-CM | POA: Diagnosis not present

## 2017-10-09 DIAGNOSIS — Z933 Colostomy status: Secondary | ICD-10-CM | POA: Diagnosis not present

## 2017-10-09 DIAGNOSIS — R1312 Dysphagia, oropharyngeal phase: Secondary | ICD-10-CM | POA: Diagnosis not present

## 2017-10-09 DIAGNOSIS — S40012A Contusion of left shoulder, initial encounter: Secondary | ICD-10-CM | POA: Diagnosis not present

## 2017-10-09 DIAGNOSIS — I2510 Atherosclerotic heart disease of native coronary artery without angina pectoris: Secondary | ICD-10-CM | POA: Diagnosis not present

## 2017-10-09 DIAGNOSIS — Z433 Encounter for attention to colostomy: Secondary | ICD-10-CM | POA: Diagnosis not present

## 2017-10-09 DIAGNOSIS — Z743 Need for continuous supervision: Secondary | ICD-10-CM | POA: Diagnosis not present

## 2017-10-09 DIAGNOSIS — Z8673 Personal history of transient ischemic attack (TIA), and cerebral infarction without residual deficits: Secondary | ICD-10-CM | POA: Diagnosis not present

## 2017-10-09 DIAGNOSIS — Z930 Tracheostomy status: Secondary | ICD-10-CM | POA: Diagnosis not present

## 2017-10-09 DIAGNOSIS — Z7901 Long term (current) use of anticoagulants: Secondary | ICD-10-CM | POA: Diagnosis not present

## 2017-10-09 DIAGNOSIS — N179 Acute kidney failure, unspecified: Secondary | ICD-10-CM | POA: Diagnosis not present

## 2017-10-09 DIAGNOSIS — J9622 Acute and chronic respiratory failure with hypercapnia: Secondary | ICD-10-CM | POA: Diagnosis not present

## 2017-10-09 DIAGNOSIS — N390 Urinary tract infection, site not specified: Secondary | ICD-10-CM | POA: Diagnosis not present

## 2017-10-09 DIAGNOSIS — I739 Peripheral vascular disease, unspecified: Secondary | ICD-10-CM | POA: Diagnosis not present

## 2017-10-09 DIAGNOSIS — G9341 Metabolic encephalopathy: Secondary | ICD-10-CM

## 2017-10-09 DIAGNOSIS — K55069 Acute infarction of intestine, part and extent unspecified: Secondary | ICD-10-CM

## 2017-10-09 DIAGNOSIS — F1120 Opioid dependence, uncomplicated: Secondary | ICD-10-CM | POA: Diagnosis not present

## 2017-10-09 DIAGNOSIS — I82402 Acute embolism and thrombosis of unspecified deep veins of left lower extremity: Secondary | ICD-10-CM | POA: Diagnosis not present

## 2017-10-09 DIAGNOSIS — K219 Gastro-esophageal reflux disease without esophagitis: Secondary | ICD-10-CM | POA: Diagnosis not present

## 2017-10-09 DIAGNOSIS — G9009 Other idiopathic peripheral autonomic neuropathy: Secondary | ICD-10-CM | POA: Diagnosis not present

## 2017-10-09 DIAGNOSIS — J189 Pneumonia, unspecified organism: Secondary | ICD-10-CM

## 2017-10-09 DIAGNOSIS — E785 Hyperlipidemia, unspecified: Secondary | ICD-10-CM | POA: Diagnosis not present

## 2017-10-09 DIAGNOSIS — J9600 Acute respiratory failure, unspecified whether with hypoxia or hypercapnia: Secondary | ICD-10-CM | POA: Diagnosis not present

## 2017-10-09 DIAGNOSIS — Z8674 Personal history of sudden cardiac arrest: Secondary | ICD-10-CM | POA: Diagnosis not present

## 2017-10-09 DIAGNOSIS — G8104 Flaccid hemiplegia affecting left nondominant side: Secondary | ICD-10-CM | POA: Diagnosis not present

## 2017-10-09 DIAGNOSIS — Z86718 Personal history of other venous thrombosis and embolism: Secondary | ICD-10-CM | POA: Diagnosis not present

## 2017-10-09 DIAGNOSIS — F17200 Nicotine dependence, unspecified, uncomplicated: Secondary | ICD-10-CM | POA: Diagnosis not present

## 2017-10-09 DIAGNOSIS — Z4659 Encounter for fitting and adjustment of other gastrointestinal appliance and device: Secondary | ICD-10-CM

## 2017-10-09 DIAGNOSIS — K559 Vascular disorder of intestine, unspecified: Secondary | ICD-10-CM | POA: Diagnosis not present

## 2017-10-09 DIAGNOSIS — R279 Unspecified lack of coordination: Secondary | ICD-10-CM | POA: Diagnosis not present

## 2017-10-09 DIAGNOSIS — G9340 Encephalopathy, unspecified: Secondary | ICD-10-CM

## 2017-10-09 DIAGNOSIS — B351 Tinea unguium: Secondary | ICD-10-CM | POA: Diagnosis not present

## 2017-10-09 DIAGNOSIS — J9601 Acute respiratory failure with hypoxia: Secondary | ICD-10-CM | POA: Diagnosis not present

## 2017-10-09 DIAGNOSIS — Z4682 Encounter for fitting and adjustment of non-vascular catheter: Secondary | ICD-10-CM | POA: Diagnosis not present

## 2017-10-09 DIAGNOSIS — I69354 Hemiplegia and hemiparesis following cerebral infarction affecting left non-dominant side: Secondary | ICD-10-CM | POA: Diagnosis not present

## 2017-10-09 DIAGNOSIS — Z86711 Personal history of pulmonary embolism: Secondary | ICD-10-CM

## 2017-10-09 HISTORY — DX: Metabolic encephalopathy: G93.41

## 2017-10-09 HISTORY — DX: Other pulmonary embolism without acute cor pulmonale: I26.99

## 2017-10-09 LAB — BASIC METABOLIC PANEL
ANION GAP: 7 (ref 5–15)
BUN: 16 mg/dL (ref 6–20)
CO2: 21 mmol/L — AB (ref 22–32)
CREATININE: 1.15 mg/dL (ref 0.61–1.24)
Calcium: 7.6 mg/dL — ABNORMAL LOW (ref 8.9–10.3)
Chloride: 110 mmol/L (ref 101–111)
GFR calc Af Amer: >60 mL/min (ref >60)
GFR calc non Af Amer: >60 mL/min (ref >60)
Glucose, Bld: 95 mg/dL (ref 65–99)
POTASSIUM: 4.1 mmol/L (ref 3.5–5.1)
SODIUM: 138 mmol/L (ref 135–145)

## 2017-10-09 LAB — TYPE AND SCREEN
ABO/RH(D): O POS
Antibody Screen: NEGATIVE
UNIT DIVISION: 0

## 2017-10-09 LAB — COMPREHENSIVE METABOLIC PANEL
ALK PHOS: 109 U/L (ref 38–126)
ALT: 13 U/L — AB (ref 17–63)
AST: 39 U/L (ref 15–41)
Albumin: 2.1 g/dL — ABNORMAL LOW (ref 3.5–5.0)
Anion gap: 11 (ref 5–15)
BUN: 16 mg/dL (ref 6–20)
CALCIUM: 8 mg/dL — AB (ref 8.9–10.3)
CO2: 20 mmol/L — AB (ref 22–32)
Chloride: 107 mmol/L (ref 101–111)
Creatinine, Ser: 1.26 mg/dL — ABNORMAL HIGH (ref 0.61–1.24)
GFR calc Af Amer: >60 mL/min (ref >60)
GFR calc non Af Amer: 57 mL/min — ABNORMAL LOW (ref >60)
Glucose, Bld: 80 mg/dL (ref 65–99)
Potassium: 5.7 mmol/L — ABNORMAL HIGH (ref 3.5–5.1)
SODIUM: 138 mmol/L (ref 135–145)
Total Bilirubin: 2 mg/dL — ABNORMAL HIGH (ref 0.3–1.2)
Total Protein: 6.2 g/dL — ABNORMAL LOW (ref 6.5–8.1)

## 2017-10-09 LAB — BLOOD GAS, ARTERIAL
Acid-base deficit: 1.5 mmol/L (ref 0.0–2.0)
BICARBONATE: 21.7 mmol/L (ref 20.0–28.0)
FIO2: 28
O2 Saturation: 97.5 %
PCO2 ART: 30 mmHg — AB (ref 32.0–48.0)
PH ART: 7.472 — AB (ref 7.350–7.450)
PO2 ART: 91.2 mmHg (ref 83.0–108.0)
Patient temperature: 98

## 2017-10-09 LAB — CBC WITH DIFFERENTIAL/PLATELET
BASOS PCT: 1 %
Basophils Absolute: 0.1 10*3/uL (ref 0.0–0.1)
EOS ABS: 0.3 10*3/uL (ref 0.0–0.7)
Eosinophils Relative: 5 %
HCT: 33.8 % — ABNORMAL LOW (ref 39.0–52.0)
Hemoglobin: 10.6 g/dL — ABNORMAL LOW (ref 13.0–17.0)
Lymphocytes Relative: 18 %
Lymphs Abs: 1 10*3/uL (ref 0.7–4.0)
MCH: 28.3 pg (ref 26.0–34.0)
MCHC: 31.4 g/dL (ref 30.0–36.0)
MCV: 90.1 fL (ref 78.0–100.0)
Monocytes Absolute: 0.5 10*3/uL (ref 0.1–1.0)
Monocytes Relative: 10 %
NEUTROS ABS: 3.4 10*3/uL (ref 1.7–7.7)
Neutrophils Relative %: 66 %
PLATELETS: 236 10*3/uL (ref 150–400)
RBC: 3.75 MIL/uL — ABNORMAL LOW (ref 4.22–5.81)
RDW: 15.9 % — AB (ref 11.5–15.5)
WBC: 5.3 10*3/uL (ref 4.0–10.5)

## 2017-10-09 LAB — CBC
HEMATOCRIT: 28.1 % — AB (ref 39.0–52.0)
Hemoglobin: 8.6 g/dL — ABNORMAL LOW (ref 13.0–17.0)
MCH: 27.5 pg (ref 26.0–34.0)
MCHC: 30.6 g/dL (ref 30.0–36.0)
MCV: 89.8 fL (ref 78.0–100.0)
Platelets: 242 10*3/uL (ref 150–400)
RBC: 3.13 MIL/uL — AB (ref 4.22–5.81)
RDW: 15.6 % — ABNORMAL HIGH (ref 11.5–15.5)
WBC: 6.8 10*3/uL (ref 4.0–10.5)

## 2017-10-09 LAB — BPAM RBC
Blood Product Expiration Date: 201907182359
ISSUE DATE / TIME: 201906192124
UNIT TYPE AND RH: 5100

## 2017-10-09 LAB — GLUCOSE, CAPILLARY
GLUCOSE-CAPILLARY: 95 mg/dL (ref 65–99)
Glucose-Capillary: 91 mg/dL (ref 65–99)
Glucose-Capillary: 82 mg/dL (ref 65–99)

## 2017-10-09 LAB — RETICULOCYTES
RBC.: 3.13 MIL/uL — ABNORMAL LOW (ref 4.22–5.81)
RETIC CT PCT: 2.8 % (ref 0.4–3.1)
Retic Count, Absolute: 87.6 10*3/uL (ref 19.0–186.0)

## 2017-10-09 LAB — HEPARIN LEVEL (UNFRACTIONATED)
HEPARIN UNFRACTIONATED: 0.48 [IU]/mL (ref 0.30–0.70)
Heparin Unfractionated: 0.32 IU/mL (ref 0.30–0.70)

## 2017-10-09 LAB — FERRITIN: Ferritin: 519 ng/mL — ABNORMAL HIGH (ref 24–336)

## 2017-10-09 LAB — MAGNESIUM: MAGNESIUM: 1.8 mg/dL (ref 1.7–2.4)

## 2017-10-09 LAB — IRON AND TIBC
IRON: 11 ug/dL — AB (ref 45–182)
SATURATION RATIOS: 7 % — AB (ref 17.9–39.5)
TIBC: 151 ug/dL — AB (ref 250–450)
UIBC: 140 ug/dL

## 2017-10-09 LAB — VITAMIN B12: VITAMIN B 12: 435 pg/mL (ref 180–914)

## 2017-10-09 LAB — OCCULT BLOOD X 1 CARD TO LAB, STOOL: FECAL OCCULT BLD: POSITIVE — AB

## 2017-10-09 LAB — FOLATE: FOLATE: 11.6 ng/mL (ref >5.9)

## 2017-10-09 MED ORDER — ACETAMINOPHEN 325 MG PO TABS: 650.0000 mg | ORAL_TABLET | Freq: Four times a day (QID) | ORAL | 0 refills | Status: AC

## 2017-10-09 MED ORDER — HEPARIN (PORCINE) IN NACL 100-0.45 UNIT/ML-% IJ SOLN: 2600.0000 [IU]/h | INTRAMUSCULAR | 0 refills | Status: DC

## 2017-10-09 MED ORDER — ASPIRIN 81 MG PO CHEW: 81.0000 mg | CHEWABLE_TABLET | Freq: Every day | ORAL | 0 refills | Status: DC

## 2017-10-09 MED ORDER — GLYCOPYRROLATE 0.2 MG/ML IJ SOLN: 0.2000 mg | INTRAMUSCULAR | 0 refills | Status: DC | PRN

## 2017-10-09 MED ORDER — SCOPOLAMINE 1 MG/3DAYS TD PT72: 1.0000 | MEDICATED_PATCH | TRANSDERMAL | 0 refills | Status: DC

## 2017-10-09 MED ORDER — FENTANYL CITRATE (PF) 100 MCG/2ML IJ SOLN: 50.0000 ug | INTRAMUSCULAR | 0 refills | Status: DC | PRN

## 2017-10-09 MED ORDER — OSMOLITE 1.2 CAL PO LIQD: 1000.0000 mL | ORAL | 0 refills | Status: DC

## 2017-10-09 MED ORDER — PRO-STAT SUGAR FREE PO LIQD: 60.0000 mL | Freq: Two times a day (BID) | ORAL | 0 refills | Status: DC

## 2017-10-09 MED ORDER — LOPERAMIDE HCL 1 MG/7.5ML PO SUSP
4.0000 mg | Freq: Every day | ORAL | 0 refills | Status: DC
Start: 2017-10-09 — End: 2018-03-30

## 2017-10-09 MED ORDER — INSULIN ASPART 100 UNIT/ML ~~LOC~~ SOLN: 0.0000 [IU] | SUBCUTANEOUS | 0 refills | Status: DC

## 2017-10-09 MED ORDER — IPRATROPIUM-ALBUTEROL 0.5-2.5 (3) MG/3ML IN SOLN: 3.0000 mL | RESPIRATORY_TRACT | 0 refills | Status: DC | PRN

## 2017-10-09 MED ORDER — FREE WATER: 200.0000 mL | Freq: Four times a day (QID) | 0 refills | Status: DC

## 2017-10-09 MED ORDER — HYDROMORPHONE HCL 2 MG PO TABS: 1.0000 mg | ORAL_TABLET | ORAL | 0 refills | Status: DC

## 2017-10-09 MED ORDER — METOPROLOL TARTRATE 25 MG PO TABS
25.0000 mg | ORAL_TABLET | Freq: Two times a day (BID) | ORAL | 0 refills | Status: DC
Start: 2017-10-09 — End: 2018-11-25

## 2017-10-09 MED ORDER — LORAZEPAM 2 MG/ML IJ SOLN
2.0000 mg | Freq: Once | INTRAMUSCULAR | Status: AC
  Administered 2017-10-09: 2 mg via INTRAVENOUS
  Filled 2017-10-09: qty 1

## 2017-10-09 MED ORDER — ATORVASTATIN CALCIUM 80 MG PO TABS: 80.0000 mg | ORAL_TABLET | Freq: Every day | ORAL | 0 refills | Status: DC

## 2017-10-09 MED ORDER — FAMOTIDINE 40 MG/5ML PO SUSR: 20.0000 mg | Freq: Two times a day (BID) | ORAL | 0 refills | Status: DC

## 2017-10-09 NOTE — Progress Notes (Signed)
Patient ID: Luke Kaufman, male   DOB: 03/11/1951, 67 y.o.   MRN: [id number] Discussed with nursing on [place] as well as Dr. Brantley Stage.  Primary service would like to look at [facility] for this patient.  He is surgically stable for LTAC from our standpoint.  He will need to continue with wound VAC changes MWF with mepitel, white sponge, and then black sponge to protect his gut as it continues to granulate in.  Henreitta Cea 8:29 AM 10/09/2017

## 2017-10-09 NOTE — Discharge Summary (Signed)
Physician Discharge Summary  Luke Kaufman BOF:[id number] DOB: January 07, 1951 DOA: 09/03/2017  PCP: Jefm Petty, MD  Admit date: 09/03/2017 Discharge date: 10/09/2017  Time spent: 35 minutes  Recommendations for Outpatient Follow-up: Mesenteric ischemia 1.  Exploratory laparotomy right hemicolectomy 09/04/2017 Dr. Stark Klein 2.  Abdominal exploration, application of temporary abdominal closure device (ABTHERA), 09/04/2017 Dr. Nadeen Landau 3.  Oratory laparotomy, ileostomy, application of open abdominal wound VAC, 09/06/2017 Dr. Georganna Skeans 4.  Open abdomen for attempted closure, 09/08/2017 Dr. Judeth Horn 5.  Oratory laparotomy with possible wound closure(prior sutures upper & lower portion of the fascia had broken), 09/10/2017 Dr. Judeth Horn 6.  Reexploration open abdominal wound, application of ABTHERA WOUND VAC, 09/14/2017 Dr. Greer Pickerel 7. Exploratory laparotomy and partial closure of abdomen using the elastic tension closure device (ABRA), Dr. Hulen Skains, 05/29 8. exploratory laparotomy and dressing change, Dr. Ninfa Linden, 05/31 9. Changed negative pressure dressing, Dr. Kieth Brightly, 06/03 10. reopening of recent laparotomy, incisional hernia repair, insertion of mesh, placement of negative pressure dressing, Dr. Kieth Brightly, 06/04  11. Bedside vac change, Dr. Kieth Brightly, 06/07-6/17 [place] to take over on 6/19   Acute respiratory failure with hypoxia/pulmonary edema -5/24 S/P tracheostomy -Scopolamine patch placed secondary to copious secretions -Currently patient with confusion unable to participate in PMV trials   V. fib arrest -V. fib arrest 6/11: CPR performed for 11 minutes, defibrillation/cardioversion x2 with ROSC  -Cardiac catheterization per cardiology when more stable   CAD/NSTEMI/ -Echocardiogram EF 40 to 45% with severe hypokinesis: Repeat echocardiogram 6/14 EF back to normal -Strict in and out since admission +3.2 L -Daily weight  Filed Weights   10/07/17 0437 10/08/17  0404 10/09/17 0458  Weight: 254 lb 6.6 oz (115.4 kg) 255 lb 11.7 oz (116 kg) 252 lb 3.3 oz (114.4 kg)  -Transfuse for hemoglobin <8 -6/19 transfuse 1 unit PRBC   New onset A. fib - Onset post V. fib arrest - Currently NSR - Heparin drip   Essential HTN -See NSTEMI     COPD -Patient with tracheostomy currently not an issue.    LLL PE/DVT RUE brachial vein/SVT LUE basilic and cephalic vein/DVT LLE, from wall and femoral veins - Heparin drip - Once CorTrak tube replaced would start patient on DMARD (Eliquis).  Prior to starting clear with surgery.   PVD     Acute kidney injury Recent Labs  Lab 10/03/17 1954 10/04/17 0223 10/05/17 0712 10/06/17 0445 10/07/17 0455 10/08/17 0500 10/09/17 0500  CREATININE 1.40* 1.38* 1.27* 1.22 1.20 1.20 1.15  -Resolved   Acute encephalopathy with hypoactive delirium - Unsure patient's baseline.  Patient would not follow commands today.  Appears agitated.  In restraints -Residual left-sided weakness per epic note however patient unable to cooperate to evaluate -CT head 6/11 negative  Left Hemiplegia new onset - Per surgery who knows patient very well prior to patient's current illness patient with A/O x4 performing all ADLs walking talking.  Now with LEFT hemiplegia. -Initial head CT 6/12 negative for acute process. - MRI brain Wo contrast pending.  To be completed at select LTAC   Severe protein calorie malnutrition TF as ordered via [facility]     Anemia of critical illness? -No overt evidence of bleed - Anemia panel pending.  Work-up to be completed at [facility] -Occult blood pending.  Work-up to be completed LTAC   Hyperglycemia -Moderate SSI         Discharge Diagnoses:  Principal Problem:   Mesenteric ischemia ([place]) Active Problems:   Essential hypertension   Arterial  atherosclerosis   HLD (hyperlipidemia)   Abdominal pain   PUD (peptic ulcer disease)   Occlusion of superior mesenteric artery (HCC)   Respiratory failure  (HCC)   Acute respiratory failure with hypoxemia (HCC)   AKI (acute kidney injury) ([place])   Acute and chronic respiratory failure (acute-on-chronic) (HCC)   Cardiac arrest ([place])   Elevated troponin   Discharge Condition: Guarded  Diet recommendation: Tube feed  Filed Weights   10/07/17 0437 10/08/17 0404 10/09/17 0458  Weight: 254 lb 6.6 oz (115.4 kg) 255 lb 11.7 oz (116 kg) 252 lb 3.3 oz (114.4 kg)    History of present illness:  67 y/o WM PMHx CVA, gastric ulcer, HLD, HTN, MI,Tobacco abuse  Presented on 5/15 with abdominal pain and was found to have an ischemic right colon in the setting of a SMA occlusion requiring emergent bypass and eventually R hemicolectomy and ilestomy. Trached 5/24.   Complicated hospital course see above   Procedures: 5/15 admit 5/16 mesenteric angiogram and aorto to SMA bypass 5/16 ex lap R hemicolectomy 5/18 ileostomy 5/19 abdominal washout and partial fascial closure 5/21 TF started, hypoxia with turning  5/22 Return to OR for exploration, attempted closure > unable 5/24 Trach 5/26 OR for debridement and dressing change 5/29 Exploratory laparotomy, placement of ABBRA closure system 5/31 return to OR for dressing change  6/11 tolerating SBT / off sedation; Vfib arrest  6/12 VAC changed 6/14 Down size trach to 6 cuffed 6/17 [facility] assumed care   Consultations: [facility] PCCM     Antibiotics Anti-infectives (From admission, onward)   Start     Stop   10/04/17 0852  ceFAZolin (ANCEF) IVPB 1 g/50 mL premix     10/04/17 0852   10/04/17 0832  ceFAZolin (ANCEF) 2-4 GM/100ML-% IVPB    Note to Pharmacy:  Margaretmary Dys   : cabinet override   10/04/17 2044   09/19/17 0730  ceFAZolin (ANCEF) IVPB 2g/100 mL premix     09/19/17 1057   09/17/17 1145  ceFAZolin (ANCEF) 3 g in dextrose 5 % 50 mL IVPB     09/17/17 1449   09/07/17 1545  cefTRIAXone (ROCEPHIN) 2 g in sodium chloride 0.9 % 100 mL IVPB     09/11/17 1605   09/04/17 1400   piperacillin-tazobactam (ZOSYN) IVPB 3.375 g  Status:  Discontinued     09/07/17 1541   09/04/17 1100  ceFAZolin (ANCEF) IVPB 2g/100 mL premix  Status:  Discontinued     09/04/17 1157   09/04/17 0630  piperacillin-tazobactam (ZOSYN) IVPB 3.375 g  Status:  Discontinued     09/04/17 0626   09/04/17 0619  piperacillin-tazobactam (ZOSYN) IVPB 3.375 g     09/04/17 0717   09/04/17 0030  piperacillin-tazobactam (ZOSYN) IVPB 3.375 g     09/04/17 0144       Discharge Exam: Vitals:   10/09/17 0440 10/09/17 0458 10/09/17 0740 10/09/17 0756  BP:  (!) 131/56  (!) 147/69  Pulse: 98 99 98 85  Resp:  (!) 23 (!) 24 17  Temp:  98.2 F (36.8 C)  98 F (36.7 C)  TempSrc:  Axillary  Axillary  SpO2: 98% 99% 98%   Weight:  252 lb 3.3 oz (114.4 kg)    Height:        General: Open, does not follow commands, positive acute respiratory distress (on trach collar) Eyes: negative scleral hemorrhage, negative anisocoria, negative icterus Neck:  Negative scars, masses, torticollis, lymphadenopathy, JVD, #6 cuffed trach in place copious  secretions thick but clear Lungs: diffuse rhonchi, negative wheeze, negative crackles  Cardiovascular: Regular rate and rhythm without murmur gallop or rub normal S1 and S2 Abdomen: Obese, abdominal wound covered with wound VAC, minimum serosanguineous fluid drainage into container  positive soft, bowel sounds, no rebound, no ascites, no appreciable mass   Discharge Instructions   Allergies as of 10/09/2017   No Known Allergies     Medication List    STOP taking these medications   amLODipine 10 MG tablet Commonly known as:  NORVASC   aspirin EC 81 MG tablet Replaced by:  aspirin 81 MG chewable tablet   gemfibrozil 600 MG tablet Commonly known as:  LOPID   triamterene-hydrochlorothiazide 37.5-25 MG tablet Commonly known as:  MAXZIDE-25     TAKE these medications   acetaminophen 325 MG tablet Commonly known as:  TYLENOL Place 2 tablets (650 mg total) into  feeding tube every 6 (six) hours.   aspirin 81 MG chewable tablet Place 1 tablet (81 mg total) into feeding tube daily. Replaces:  aspirin EC 81 MG tablet   atorvastatin 80 MG tablet Commonly known as:  LIPITOR Place 1 tablet (80 mg total) into feeding tube daily at 6 PM. What changed:    medication strength  how much to take  how to take this  when to take this   famotidine 40 MG/5ML suspension Commonly known as:  PEPCID Place 2.5 mLs (20 mg total) into feeding tube 2 (two) times daily.   feeding supplement (OSMOLITE 1.2 CAL) Liqd Place 1,000 mLs into feeding tube continuous.   feeding supplement (PRO-STAT SUGAR FREE 64) Liqd Place 60 mLs into feeding tube 2 (two) times daily.   fentaNYL 100 MCG/2ML injection Commonly known as:  SUBLIMAZE Inject 1 mL (50 mcg total) into the vein every 2 (two) hours as needed for severe pain (breakthrough pain only).   free water Soln Place 200 mLs into feeding tube every 6 (six) hours.   glycopyrrolate 0.2 MG/ML injection Commonly known as:  ROBINUL Inject 1 mL (0.2 mg total) into the vein every 4 (four) hours as needed (heavy secretions).   heparin 100-0.45 UNIT/ML-% infusion Inject 2,600 Units/hr into the vein continuous.   HYDROmorphone 2 MG tablet Commonly known as:  DILAUDID Place 0.5 tablets (1 mg total) into feeding tube every 4 (four) hours.   insulin aspart 100 UNIT/ML injection Commonly known as:  novoLOG Inject 0-15 Units into the skin every 4 (four) hours.   ipratropium-albuterol 0.5-2.5 (3) MG/3ML Soln Commonly known as:  DUONEB Take 3 mLs by nebulization every 4 (four) hours as needed.   loperamide HCl 1 MG/7.5ML suspension Commonly known as:  IMODIUM Place 30 mLs (4 mg total) into feeding tube daily.   metoprolol tartrate 25 MG tablet Commonly known as:  LOPRESSOR Place 1 tablet (25 mg total) into feeding tube 2 (two) times daily. What changed:    medication strength  how much to take  how to take  this   scopolamine 1 MG/3DAYS Commonly known as:  TRANSDERM-SCOP Place 1 patch (1.5 mg total) onto the skin every 3 (three) days. Start taking on:  10/11/2017      No Known Allergies    The results of significant diagnostics from this hospitalization (including imaging, microbiology, ancillary and laboratory) are listed below for reference.    Significant Diagnostic Studies: Dg Abd 1 View  Result Date: 10/04/2017 CLINICAL DATA:  OG tube placement EXAM: ABDOMEN - 1 VIEW COMPARISON:  09/13/2017 FINDINGS: Feeding tube tip  overlies the third portion of the duodenum. Overall decreased upper bowel gas. IMPRESSION: Esophageal tube tip overlies the third portion of the duodenum Electronically Signed   By: Donavan Foil M.D.   On: 10/04/2017 23:04   Ct Head Wo Contrast  Result Date: 10/01/2017 CLINICAL DATA:  Altered mental status.  Left hemiplegia. EXAM: CT HEAD WITHOUT CONTRAST TECHNIQUE: Contiguous axial images were obtained from the base of the skull through the vertex without intravenous contrast. COMPARISON:  In head CT 06/21/2013 FINDINGS: Brain: There is no mass, hemorrhage or extra-axial collection. There is generalized atrophy without lobar predilection. There is no acute or chronic infarction. There is hypoattenuation of the periventricular white matter, most commonly indicating chronic ischemic microangiopathy. Vascular: No abnormal hyperdensity of the major intracranial arteries or dural venous sinuses. No intracranial atherosclerosis. Skull: The visualized skull base, calvarium and extracranial soft tissues are normal. Sinuses/Orbits: Fluid level in the right maxillary sinus and complete opacification of both mastoids and middle ears. The orbits are normal. IMPRESSION: 1. Chronic microvascular ischemia and generalized atrophy without acute intracranial abnormality. 2. Right maxillary sinus fluid level and bilateral mastoid and middle ear effusions. Electronically Signed   By: Ulyses Jarred  M.D.   On: 10/01/2017 01:14   Ct Angio Chest Pe W Or Wo Contrast  Addendum Date: 10/01/2017   ADDENDUM REPORT: 10/01/2017 01:57 ADDENDUM: These results were called by telephone at the time of interpretation on 10/01/2017 at 1:57 am to NP PAULA SIMPSON , who verbally acknowledged these results. Electronically Signed   By: Ashley Royalty M.D.   On: 10/01/2017 01:57   Result Date: 10/01/2017 CLINICAL DATA:  Cardiac arrest post CPR moving right-side only and not following commands. EXAM: CT ANGIOGRAPHY CHEST WITH CONTRAST TECHNIQUE: Multidetector CT imaging of the chest was performed using the standard protocol during bolus administration of intravenous contrast. Multiplanar CT image reconstructions and MIPs were obtained to evaluate the vascular anatomy. CONTRAST:  66mL ISOVUE-370 IOPAMIDOL (ISOVUE-370) INJECTION 76% COMPARISON:  Same day CXR FINDINGS: Cardiovascular: Conventional branch pattern of the great vessels with atherosclerosis at the origins. No significant stenosis. Moderate aortic atherosclerosis of the nonaneurysmal aorta. Satisfactory opacification of the pulmonary arteries with left lower lobe pulmonary emboli noted within the lobar and segmental branches. Right heart strain noted with RV/LV ratio of approximately 0.89. No pericardial effusion. Left sided approach PICC line is seen in the distal SVC. Mediastinum/Nodes: Tracheostomy tube tip is seen within the upper third of the trachea terminating at the level of the aortic arch. Gastric tube extends into the stomach and terminates in the duodenal bulb. No adenopathy. Lungs/Pleura: Small left and small to moderate right pleural effusions with adjacent atelectasis. No pneumothorax. Upper Abdomen: Rectus diastasis of the muscles with interposed intra-abdominal fat. Reportedly the patient has undergone exploratory laparotomy with incisional hernia repair and mesh insertion which this may represent a portion of. Biliary sludge noted within the gallbladder.  Atrophic left kidney with marked renal cortical thinning and compensatory hypertrophy of the visualized right kidney. Normal adrenal glands and spleen. Musculoskeletal: No chest wall abnormality. No acute or significant osseous findings. Review of the MIP images confirms the above findings. IMPRESSION: 1. Acute nonocclusive pulmonary emboli to the left lower lobe. CT evidence of right heart strain (RV/LV Ratio = 0.89) consistent with at least submassive (intermediate risk) PE. The presence of right heart strain has been associated with an increased risk of morbidity and mortality. Please activate Code PE by paging [phone]. 2. Cardiomegaly with bilateral small pleural effusions  right greater than left. Adjacent compressive atelectasis is identified. No pneumothorax. 3. Support lines and tubes as above in satisfactory position. 4. Atrophic left kidney with compensatory hypertrophy of the right kidney. 5. Partially included ventral mesh repair of the abdomen. Aortic Atherosclerosis (ICD10-I70.0). Electronically Signed: By: Ashley Royalty M.D. On: 10/01/2017 01:41   Ir Fluoro Guide Cv Line Right  Result Date: 10/05/2017 CLINICAL DATA:  Gastric ulcer, myocardial infarction, tracheotomy, difficult venous access EXAM: TUNNELED CENTRAL VENOUS CATHETER PLACEMENT WITH ULTRASOUND AND FLUOROSCOPIC GUIDANCE TECHNIQUE: Patency of the right IJ vein was confirmed with ultrasound with image documentation. An appropriate skin site was determined. Region was prepped using maximum barrier technique including cap and mask, sterile gown, sterile gloves, large sterile sheet, and Chlorhexidine as cutaneous antisepsis. The region was infiltrated locally with 1% lidocaine. Under real-time ultrasound guidance, the right IJ vein was accessed with a 21 gauge micropuncture needle; the needle tip within the vein was confirmed with ultrasound image documentation. 83F dual-lumen cuffed powerPICC tunneled from a right anterior chest wall  approach to the dermatotomy site. Needle exchanged over the 018 guidewire for transitional dilator, through which the catheter which had been cut to 24 cm was advanced under intermittent fluoroscopy, positioned with its tip at the cavoatrial junction. Spot chest radiograph confirms good catheter position. No pneumothorax. Catheter was flushed per protocol. Catheter secured externally with O Prolene suture. The right IJ dermatotomy site was closed with Dermabond. COMPLICATIONS: COMPLICATIONS None immediate FLUOROSCOPY TIME:  0.1 minutes; 13 uGym2 DAP COMPARISON:  None IMPRESSION: 1. Technically successful placement of tunneled right IJ tunneled dual-lumen power injectable catheter with ultrasound and fluoroscopic guidance. Ready for routine use. Electronically Signed   By: Lucrezia Europe M.D.   On: 10/05/2017 08:11   Ir US Guide Vasc Access Right  Result Date: 10/05/2017 CLINICAL DATA:  Gastric ulcer, myocardial infarction, tracheotomy, difficult venous access EXAM: TUNNELED CENTRAL VENOUS CATHETER PLACEMENT WITH ULTRASOUND AND FLUOROSCOPIC GUIDANCE TECHNIQUE: Patency of the right IJ vein was confirmed with ultrasound with image documentation. An appropriate skin site was determined. Region was prepped using maximum barrier technique including cap and mask, sterile gown, sterile gloves, large sterile sheet, and Chlorhexidine as cutaneous antisepsis. The region was infiltrated locally with 1% lidocaine. Under real-time ultrasound guidance, the right IJ vein was accessed with a 21 gauge micropuncture needle; the needle tip within the vein was confirmed with ultrasound image documentation. 83F dual-lumen cuffed powerPICC tunneled from a right anterior chest wall approach to the dermatotomy site. Needle exchanged over the 018 guidewire for transitional dilator, through which the catheter which had been cut to 24 cm was advanced under intermittent fluoroscopy, positioned with its tip at the cavoatrial junction. Spot chest  radiograph confirms good catheter position. No pneumothorax. Catheter was flushed per protocol. Catheter secured externally with O Prolene suture. The right IJ dermatotomy site was closed with Dermabond. COMPLICATIONS: COMPLICATIONS None immediate FLUOROSCOPY TIME:  0.1 minutes; 13 uGym2 DAP COMPARISON:  None IMPRESSION: 1. Technically successful placement of tunneled right IJ tunneled dual-lumen power injectable catheter with ultrasound and fluoroscopic guidance. Ready for routine use. Electronically Signed   By: Lucrezia Europe M.D.   On: 10/05/2017 08:11   Dg Chest Port 1 View  Result Date: 10/06/2017 CLINICAL DATA:  Acute respiratory failure EXAM: PORTABLE CHEST 1 VIEW COMPARISON:  Yesterday FINDINGS: Tracheostomy tube is well seated. A feeding tube reaches the stomach at least. Right IJ line with tip at the upper cavoatrial junction. Stable heart size and mediastinal contours. Mildly  improved aeration. There is still streaky density at the bases attributed atelectasis. No effusion or pneumothorax seen. IMPRESSION: 1. Mildly improved aeration. 2. Stable hardware positioning. Electronically Signed   By: Monte Fantasia M.D.   On: 10/06/2017 07:31   Dg Chest Port 1 View  Result Date: 10/05/2017 CLINICAL DATA:  Acute respiratory failure EXAM: PORTABLE CHEST 1 VIEW COMPARISON:  09/30/2017 FINDINGS: Right internal jugular PICC line has been placed with the tip in the upper right atrium. Endotracheal tube is unchanged. Feeding tube enters the stomach. The tip is not visualized. Mild cardiomegaly and vascular congestion. Bibasilar atelectasis. No overt edema or effusions. IMPRESSION: Cardiomegaly with vascular congestion and bibasilar atelectasis. Electronically Signed   By: Rolm Baptise M.D.   On: 10/05/2017 07:27   Dg Chest Port 1 View  Result Date: 09/30/2017 CLINICAL DATA:  Cardiac arrest EXAM: PORTABLE CHEST 1 VIEW COMPARISON:  Chest radiograph 09/27/2017 FINDINGS: Unchanged cardiomegaly. Left-sided PICC  line tip is at the cavoatrial junction. Ventilation tube tip is just below the level of the clavicular heads, well above the carina. Gastric tube courses below the diaphragm. No focal airspace consolidation. There is pulmonary venous apical redistribution with probable mild interstitial edema. No pleural effusion or pneumothorax. IMPRESSION: 1. Cardiomegaly and suspected early interstitial pulmonary edema. 2. Unchanged support apparatus. Electronically Signed   By: Ulyses Jarred M.D.   On: 09/30/2017 22:31   Dg Chest Port 1 View  Result Date: 09/27/2017 CLINICAL DATA:  Respiratory failure EXAM: PORTABLE CHEST 1 VIEW COMPARISON:  09/25/2017 chest radiograph. FINDINGS: Tracheostomy tube tip terminates over the tracheal air column at the level of the thoracic inlet. Left PICC terminates at the cavoatrial junction. Enteric tube terminates in the gastric fundus. Stable cardiomediastinal silhouette with normal heart size. No pneumothorax. No pleural effusion. Mild bibasilar atelectasis. No pulmonary edema. No acute consolidative airspace disease. IMPRESSION: Support structures as detailed.  Mild bibasilar atelectasis. Electronically Signed   By: Ilona Sorrel M.D.   On: 09/27/2017 16:59   Dg Chest Port 1 View  Result Date: 09/25/2017 CLINICAL DATA:  Tracheostomy. EXAM: PORTABLE CHEST 1 VIEW COMPARISON:  September 24, 2017 FINDINGS: The right PICC line and tracheostomy tube are stable. The NG tube terminates in the stomach. The cardiomediastinal silhouette is stable. The lungs are clear with no focal infiltrate. No pneumothorax. IMPRESSION: Support apparatus as above.  No other abnormalities. Electronically Signed   By: Dorise Bullion III M.D   On: 09/25/2017 09:24   Dg Chest Port 1 View  Result Date: 09/24/2017 CLINICAL DATA:  Respiratory failure. EXAM: PORTABLE CHEST 1 VIEW COMPARISON:  09/23/2017 FINDINGS: Tracheostomy tube, enteric catheter and left PICC line in stable position. Cardiomediastinal silhouette is  normal. Mediastinal contours appear intact. Low lung volumes with bilateral lower lobe atelectasis. Probable bilateral subpulmonic effusions. Osseous structures are without acute abnormality. Soft tissues are grossly normal. IMPRESSION: Low lung volumes with bilateral lower lobe atelectasis. Probable bilateral subpulmonic effusions. Electronically Signed   By: Fidela Salisbury M.D.   On: 09/24/2017 09:21   Dg Chest Port 1 View  Result Date: 09/23/2017 CLINICAL DATA:  Tracheostomy.  Respiratory failure. EXAM: PORTABLE CHEST 1 VIEW FINDINGS: The heart is enlarged. There is no consolidation or edema. Small RIGHT effusion is stable. Tracheostomy tube good position. Nasogastric tube is coiled in the stomach. IMPRESSION: Stable chest. Electronically Signed   By: Staci Righter M.D.   On: 09/23/2017 07:39   Dg Chest Port 1 View  Result Date: 09/21/2017 CLINICAL DATA:  Respiratory failure  EXAM: PORTABLE CHEST 1 VIEW COMPARISON:  September 20, 2017 FINDINGS: The tracheostomy tube and left PICC line are stable. The NG tube terminates in the stomach. No pneumothorax. Bilateral pleural effusions with underlying opacities are stable. Stable cardiomediastinal silhouette. Stable tracheostomy tube. IMPRESSION: 1. Stable support apparatus as above. 2. Stable bilateral pleural effusions with underlying opacities. Electronically Signed   By: Dorise Bullion III M.D   On: 09/21/2017 07:11   Dg Chest Port 1 View  Result Date: 09/20/2017 CLINICAL DATA:  Followup ventilator support. EXAM: PORTABLE CHEST 1 VIEW COMPARISON:  09/19/2017 FINDINGS: Tracheostomy in place. Nasogastric tube enters the abdomen. Left arm PICC tip at the SVC RA junction. Persistent lower lobe atelectasis/infiltrate, right worse than left. Possible associated effusion, particularly on the right. Upper lobes remain clear. No new finding. IMPRESSION: Persistent lower lobe atelectasis/infiltrate. Findings worse on the right, possibly with associated right effusion.  Electronically Signed   By: Nelson Chimes M.D.   On: 09/20/2017 08:09   Dg Chest Port 1 View  Result Date: 09/19/2017 CLINICAL DATA:  Tracheostomy.  Acute respiratory failure. EXAM: PORTABLE CHEST 1 VIEW COMPARISON:  09/16/2017. FINDINGS: Tracheostomy tube, NG tube, left PICC line in stable position. Cardiomegaly with bilateral pulmonary interstitial prominence and bilateral pleural effusions consistent with congestive heart failure noted on today's exam. Persistent bibasilar atelectasis. No pneumothorax. IMPRESSION: 1.  Lines and tubes stable position. 2. Cardiomegaly with bilateral pulmonary interstitial prominence and bilateral pleural effusions consistent with CHF noted on today's exam. 3.  Persistent bibasilar atelectasis. Electronically Signed   By: Marcello Moores  Register   On: 09/19/2017 08:51   Dg Chest Port 1 View  Result Date: 09/16/2017 CLINICAL DATA:  Patient with respiratory failure EXAM: PORTABLE CHEST 1 VIEW COMPARISON:  Chest radiograph 09/15/2017. FINDINGS: Tracheostomy tube terminates in the mid trachea. Enteric tube courses inferior to the diaphragm. Left upper extremity PICC line tip projects over the superior vena cava. Monitoring leads overlie the patient. Stable cardiac and mediastinal contours. Low lung volumes. Bibasilar heterogeneous opacities. No pleural effusion or pneumothorax. IMPRESSION: Support apparatus as above. Bibasilar atelectasis. Electronically Signed   By: Lovey Newcomer M.D.   On: 09/16/2017 07:36   Dg Chest Port 1 View  Result Date: 09/15/2017 CLINICAL DATA:  Respiratory failure. EXAM: PORTABLE CHEST 1 VIEW COMPARISON:  09/14/2017 and older exams. FINDINGS: There has been mild improvement in right perihilar and bilateral lung base opacity since the previous day's study. Residual opacity is most likely atelectasis. Suspect small effusions. No new lung abnormalities. No evidence of pulmonary edema. Tracheostomy tube, left PICC and nasal/orogastric tube are stable and well  positioned. IMPRESSION: 1. Improved lung aeration when compared to the prior exam. Persistent lung base and right perihilar opacities are likely due to atelectasis associated with small effusions. No evidence of pulmonary edema. Electronically Signed   By: Lajean Manes M.D.   On: 09/15/2017 07:42   Dg Chest Port 1 View  Result Date: 09/14/2017 CLINICAL DATA:  Respiratory failure. EXAM: PORTABLE CHEST 1 VIEW COMPARISON:  09/13/2017 FINDINGS: Tracheostomy tube tip is above the carina. Interval removal of right subclavian catheter. Left arm PICC line tip is in the projection of the cavoatrial junction. Aortic atherosclerotic calcifications noted. Unchanged bilateral pleural effusions with failed like opacification overlying the lower lobes. IMPRESSION: 1. Removal of right subclavian catheter. 2. No change in bilateral pleural effusions. Electronically Signed   By: Kerby Moors M.D.   On: 09/14/2017 08:19   Dg Chest Port 1 View  Result Date: 09/13/2017  CLINICAL DATA:  Acute respiratory failure. EXAM: PORTABLE CHEST 1 VIEW COMPARISON:  09/11/2017. FINDINGS: ET tube has been replaced with a tracheostomy. Tip lies 4.5 cm above carina. Central venous catheter tip, appears to lie within the mid RIGHT atrium, potentially advanced from earlier radiograph. Cardiomediastinal shadow unchanged. Thoracic atherosclerosis. RIGHT greater than LEFT basilar opacities likely atelectasis with small effusions slightly worse. Feeding tube in the stomach. IMPRESSION: Tracheostomy in satisfactory position, with no pneumothorax. Bibasilar opacities, could be slightly worse. Potential advancement of central venous catheter, now with tip in the mid RIGHT atrium. Electronically Signed   By: Staci Righter M.D.   On: 09/13/2017 07:26   Dg Chest Port 1 View  Result Date: 09/11/2017 CLINICAL DATA:  Shortness of breath EXAM: PORTABLE CHEST 1 VIEW COMPARISON:  09/10/2017 FINDINGS: Endotracheal tube, right central line and NG tube remain  in place, unchanged. There is bibasilar atelectasis. Heart is upper limits normal in size. No visible effusions or acute bony abnormality. IMPRESSION: Bibasilar atelectasis. Electronically Signed   By: Rolm Baptise M.D.   On: 09/11/2017 10:37   Dg Chest Port 1 View  Result Date: 09/10/2017 CLINICAL DATA:  Acute respiratory failure EXAM: PORTABLE CHEST 1 VIEW COMPARISON:  09/09/2017 FINDINGS: Endotracheal tube nasogastric catheter and right-sided subclavian central line are again seen and stable. Cardiac shadow is stable. Bilateral pleural effusions right greater than left are seen stable in appearance from the prior exam. Bibasilar changes are again noted and stable. No bony abnormality is noted. IMPRESSION: Bibasilar changes with associated effusions right greater than left. Electronically Signed   By: Inez Catalina M.D.   On: 09/10/2017 08:01   Dg Abd Portable 1v  Result Date: 09/13/2017 CLINICAL DATA:  Nasogastric tube placement EXAM: PORTABLE ABDOMEN - 1 VIEW COMPARISON:  Sep 04, 2017 FINDINGS: Nasogastric tube tip and side port in stomach. There is no bowel dilatation or air-fluid level to suggest bowel obstruction. No free air. Visualized lung bases are clear. IMPRESSION: Nasogastric tube tip and side port in stomach. No bowel obstruction or free air evident. Electronically Signed   By: Lowella Grip III M.D.   On: 09/13/2017 18:40   [place] Ekg Site Rite  Result Date: 09/12/2017 If Site Rite image not attached, placement could not be confirmed due to current cardiac rhythm.   Microbiology: Recent Results (from the past 240 hour(s))  MRSA PCR Screening     Status: None   Collection Time: 10/06/17 11:24 AM  Result Value Ref Range Status   MRSA by PCR NEGATIVE NEGATIVE Final    Comment:        The GeneXpert MRSA Assay (FDA approved for NASAL specimens only), is one component of a comprehensive MRSA colonization surveillance program. It is not intended to diagnose MRSA infection nor to  guide or monitor treatment for MRSA infections. Performed at [facility], [place] [address], [place], [place] [address]      Labs: Basic Metabolic Panel: Recent Labs  Lab 10/03/17 0441  10/05/17 0712 10/06/17 0445 10/07/17 0455 10/08/17 0500 10/09/17 0500  NA 145   < > 141 140 138 136 138  K 3.6   < > 3.8 3.7 3.9 4.0 4.1  CL 119*   < > 114* 112* 110 108 110  CO2 20*   < > 20* 21* 21* 22 21*  GLUCOSE 127*   < > 119* 104* 126* 114* 95  BUN 27*   < > 21* 20 20 18 16   CREATININE 1.36*   < >  1.27* 1.22 1.20 1.20 1.15  CALCIUM 7.2*   < > 7.1* 7.1* 7.2* 7.2* 7.6*  MG 2.0  --  1.9 1.9  --   --  1.8  PHOS 2.0*  --  2.9 3.3  --   --   --    < > = values in this interval not displayed.   Liver Function Tests: Recent Labs  Lab 10/07/17 0455 10/08/17 0500  AST 26 26  ALT 16* 17  ALKPHOS 101 100  BILITOT 0.3 0.4  PROT 5.2* 5.2*  ALBUMIN 1.9* 1.8*   No results for input(s): LIPASE, AMYLASE in the last 168 hours. No results for input(s): AMMONIA in the last 168 hours. CBC: Recent Labs  Lab 10/05/17 0712 10/06/17 0445 10/07/17 0455 10/08/17 0500 10/09/17 0500  WBC 8.2 6.4 5.8 5.8 6.8  HGB 8.2* 8.0* 8.0* 7.6* 8.6*  HCT 27.7* 27.3* 26.8* 25.5* 28.1*  MCV 93.6 93.8 91.2 92.4 89.8  PLT 192 187 189 219 242   Cardiac Enzymes: No results for input(s): CKTOTAL, CKMB, CKMBINDEX, TROPONINI in the last 168 hours. BNP: BNP (last 3 results) No results for input(s): BNP in the last 8760 hours.  ProBNP (last 3 results) No results for input(s): PROBNP in the last 8760 hours.  CBG: Recent Labs  Lab 10/08/17 1613 10/08/17 2052 10/08/17 2345 10/09/17 0456 10/09/17 0756  GLUCAP 85 84 93 95 91       Signed:  Dia Crawford, MD [facility] [phone] pager

## 2017-10-09 NOTE — Progress Notes (Addendum)
Report given to Anda Kraft, Therapist, sports at [facility]	[place]. All questions answered. Plan is for patient to be transported to MRI, then [facility].  Patient transported to MRI with Silverio Lay RN. 2 mg IV ativan given to patient prior to transport per order. [facility] will be [facility] post [facility]. Patient's sister Luke Kaufman called and made aware of transport.

## 2017-10-09 NOTE — Consult Note (Addendum)
WOC consult: Bedside nurse called WOC to troubleshoot leaking Vac.  Wound edges are wet and macerated and Vac is unable to maintain a seal.  Removed Vac drape from outer edges and applied a layer of hydrocolloid around the wound to attempt to maintain a seal, then more drape and 2 track pads Yed together to facilitate drainage through Vac machine.  45 min spent attempting to troubleshoot the problem. WOC plans to change entire dressing tomorrow. Cont suction on at 178mm. Julien Girt MSN, RN, [place], [place], [place]

## 2017-10-09 NOTE — Progress Notes (Signed)
Blood infusing. Pt. tolerates infusion well. No s/s of transfusion reaction. VS stable. Abdominal dressing reinforced. No acute distress noted. Bed locked and low. Call bell within reach.

## 2017-10-09 NOTE — Progress Notes (Signed)
Spoke to [employer], RD.  She stated that there is no cortrak service today.  She also stated we have 3 options: if med needs to be given we can place NG tube, cortrak can bed replaced tomorrow, or IR can place today if needed.  Nira Conn also stated that if patient is going to LTAC that they are able to place bridle and small bored feeding tubes there.  I notified Kerrie Buffalo RN of these options.

## 2017-10-09 NOTE — Care Management Important Message (Signed)
Important Message  Patient Details  Name: Luke Kaufman MRN: [id number] Date of Birth: [birth year]   Medicare Important Message Given:  Yes    Bethena Roys, RN 10/09/2017, 1:06 PM

## 2017-10-09 NOTE — Progress Notes (Signed)
Pt not in room for 1600 Trach check.

## 2017-10-09 NOTE — Plan of Care (Signed)
  Problem: Education: Goal: Knowledge of General Education information will improve Outcome: Progressing   Problem: Health Behavior/Discharge Planning: Goal: Ability to manage health-related needs will improve Outcome: Progressing   Problem: Clinical Measurements: Goal: Ability to maintain clinical measurements within normal limits will improve Outcome: Progressing Goal: Will remain free from infection Outcome: Progressing Goal: Diagnostic test results will improve Outcome: Progressing Goal: Respiratory complications will improve Outcome: Progressing Goal: Cardiovascular complication will be avoided Outcome: Progressing   Problem: Activity: Goal: Risk for activity intolerance will decrease Outcome: Progressing   Problem: Coping: Goal: Level of anxiety will decrease Outcome: Progressing   Problem: Elimination: Goal: Will not experience complications related to bowel motility Outcome: Progressing   Problem: Pain Managment: Goal: General experience of comfort will improve Outcome: Progressing   Problem: Safety: Goal: Ability to remain free from injury will improve Outcome: Progressing   Problem: Skin Integrity: Goal: Risk for impaired skin integrity will decrease Outcome: Progressing   Problem: Education: Goal: Knowledge about tracheostomy care/management will improve Outcome: Progressing   Problem: Activity: Goal: Ability to tolerate increased activity will improve Outcome: Progressing   Problem: Health Behavior/Discharge Planning: Goal: Ability to manage tracheostomy will improve Outcome: Progressing   Problem: Respiratory: Goal: Patent airway maintenance will improve Outcome: Progressing   Problem: Role Relationship: Goal: Ability to communicate will improve Outcome: Progressing

## 2017-10-09 NOTE — Progress Notes (Signed)
ANTICOAGULATION CONSULT NOTE Pharmacy Consult for Heparin Indication: pulmonary embolus   No Known Allergies  Patient Measurements: Height: 5\' 7"  (170.2 cm) Weight: 252 lb 3.3 oz (114.4 kg) IBW/kg (Calculated) : 66.1 Heparin Dosing Weight: 90 kg  Vital Signs: Temp: 98 F (36.7 C) (06/20 0756) Temp Source: Axillary (06/20 0756) BP: 147/69 (06/20 0756) Pulse Rate: 85 (06/20 0756)  Labs: Recent Labs    10/07/17 0455 10/08/17 0500 10/09/17 0500 10/09/17 0607  HGB 8.0* 7.6* 8.6*  --   HCT 26.8* 25.5* 28.1*  --   PLT 189 219 242  --   HEPARINUNFRC 0.61 0.62  --  0.48  CREATININE 1.20 1.20 1.15  --     Estimated Creatinine Clearance: 75.3 mL/min (by C-G formula based on SCr of 1.15 mg/dL).   Assessment: 67 y.o. male admitted 5/16. He is  s/p multiple abdominal surgeries and tracheostomy,  s/p VFib arrest and found to have acute PE. Now on IV heparin. Heparin level currently therapeutic this morning at 0.48 on heparin rate 2600 units/hr. H/H stable, ptlc wnl. No bleeding noted.  Goal of Therapy:  Heparin level 0.3-0.7 units/ml Monitor platelets by anticoagulation protocol: Yes   Plan:  Continue Heparin to 2600 units/hr Daily heparin level and CBC  Thank you Anette Guarneri, PharmD [phone] 10/09/2017

## 2017-10-09 NOTE — Progress Notes (Signed)
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 Fascia remains open, agitation barrier to weaning. Trached 5/24., to return to OR 5/26, 5/29 now with mesh, wound vac   STUDIES:  CT ab/pelvis 5/15 > portal venous gas in liver, sigmoid colon with diverticular disease, atrophic left kidney CT head 6/12 > chronic ischemia, right maxillary sinus fluid level and bilateral mastoid and middle ear effusions. CTA chest 6/12 > LLL PE (RV / LV 0.89). Echo 6/12 > EF 40-45%, ? Ischemia or infarction in distribution of LAD given severe hypokinesis. UE duplex 6/12 > DVT in right brachial vein, SVT in left basilic and cephalic veins. LE dupled 6/12 > DVT in left CFV and FV.  Negative on right.  CULTURES: 5/17 resp > e coli resistant to multiple antibiotics 5/17 blood > negative  ANTIBIOTICS: 5/15 zosyn > 5/19  5/19 ceftriaxone > 5/23   LINES/TUBES: 5/16 R [place] CVL > 5/24 5/16 ETT > 5/24 5/24 Trach > 6/10 prox XLT placed >>> 6/14 downsized to #6 5/18 R femoral arterial line > 5/24 5/24 LUA PICC>> 6/15 6/14 Tunneled rt [place] PICC >>  SIGNIFICANT EVENTS: 5/16 mesenteric angiogram and aorto to SMA bypass 5/16 ex lap R hemicolectomy 5/18 ileostomy 5/19 abdominal washout and partial fascial closure 5/21 TF started, hypoxia with turning  5/22 Return to OR for exploration, attempted closure > unable 5/24 Trach 5/26 OR for debridement and dressing change 5/29 Exploratory laparotomy, placement of ABBRA closure system 5/31 - No events overnight. Plans to return to OR for dressing change  6/11- tolerating SBT/ off sedation; Vfib arrest that  evening 6/12 VAC changed 6/14 Down size trach to 6 cuffed  SUBJECTIVE/OVERNIGHT/INTERVAL HX:  Tolerating trach collar.   VITAL SIGNS: BP (!) 147/69 (BP Location: Right Leg)   Pulse 85   Temp 98 F (36.7 C) (Axillary)   Resp 17   Ht 5\' 7"  (1.702 m)   Wt 114.4 kg (252 lb 3.3 oz)   SpO2 98%   BMI 39.50 kg/m   HEMODYNAMICS:    VENTILATOR SETTINGS: FiO2 (%):  [25 %-28 %] 28 %  INTAKE / OUTPUT:  Intake/Output Summary (Last 24 hours) at 10/09/2017 0900 Last data filed at 10/09/2017 0500 Gross per 24 hour  Intake 1198.63 ml  Output 50 ml  Net 1148.63 ml    Physical exam  General: Obese male disheveled is moderately responsive HEENT: Trach in place Neuro: Moderately responsive CV: Heart sounds regular PULM: Decreased breath sounds at bases, moderate secretion GI: Abdominal wound with wound VAC in place, colostomy in place with drainage Extremities: warm/dry, plus edema  Skin: no rashes or lesions    PULMONARY Recent Labs  Lab 10/03/17 1504  PHART 7.508*  PCO2ART 25.1*  PO2ART 93.0  HCO3 20.0  TCO2 21*  O2SAT 98.0    CBC Recent Labs  Lab 10/07/17 0455 10/08/17 0500 10/09/17 0500  HGB 8.0* 7.6* 8.6*  HCT 26.8* 25.5* 28.1*  WBC 5.8 5.8 6.8  PLT 189 219 242    COAGULATION No results for input(s): INR in the last 168 hours.  CARDIAC   No results for input(s): TROPONINI in the  last 168 hours. No results for input(s): PROBNP in the last 168 hours.   CHEMISTRY Recent Labs  Lab 10/03/17 0441  10/05/17 0712 10/06/17 0445 10/07/17 0455 10/08/17 0500 10/09/17 0500  NA 145   < > 141 140 138 136 138  K 3.6   < > 3.8 3.7 3.9 4.0 4.1  CL 119*   < > 114* 112* 110 108 110  CO2 20*   < > 20* 21* 21* 22 21*  GLUCOSE 127*   < > 119* 104* 126* 114* 95  BUN 27*   < > 21* 20 20 18 16   CREATININE 1.36*   < > 1.27* 1.22 1.20 1.20 1.15  CALCIUM 7.2*   < > 7.1* 7.1* 7.2* 7.2* 7.6*  MG 2.0  --  1.9 1.9  --   --  1.8  PHOS 2.0*  --  2.9 3.3  --   --   --    <  > = values in this interval not displayed.   Estimated Creatinine Clearance: 75.3 mL/min (by C-G formula based on SCr of 1.15 mg/dL).   LIVER Recent Labs  Lab 10/07/17 0455 10/08/17 0500  AST 26 26  ALT 16* 17  ALKPHOS 101 100  BILITOT 0.3 0.4  PROT 5.2* 5.2*  ALBUMIN 1.9* 1.8*     INFECTIOUS No results for input(s): LATICACIDVEN, PROCALCITON in the last 168 hours.   ENDOCRINE CBG (last 3)  Recent Labs    10/08/17 2345 10/09/17 0456 10/09/17 0756  GLUCAP 93 95 91     IMAGING x48h  - image(s) personally visualized  -   highlighted in bold No results found.   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  Remains on trach collar.  ASSESSMENT / PLAN: Ischemic bowel, s/p resection with hemicolectomy and ileostomy; now status post several washouts.  Severe protein calorie malnutrition Plan Per central [place] surgery  Acute respiratory failure with hypoxemia with acute pulmonary edema  Status post tracheostomy 5/24 - transitioned down to ATC on 6/12 and tolerating well LLL PE, DVT H/O COPD Plan: Trach collar as tolerated Continue attempts of Passy-Muir valve To new bronchodilators He is on a heparin drip for PE DVT recent CABG transition to enteral anticoagulation per primary service   Vfib arrest 6/11 - unclear etiology at this point Non-ST elevation MI- echo with severe hypokinesis and significantly reduced EF (40-45%)  New onset Afib 6/11 Shock -6/11 - resolved History of coronary artery disease, prior MI, hypertension and peripheral vascular disease Plan Per cardiology   Acute kidney injury--> improving Lab Results  Component Value Date   CREATININE 1.15 10/09/2017   CREATININE 1.20 10/08/2017   CREATININE 1.20 10/07/2017    Plan Resolved  Fluid and electrolyte imbalance: Hypernatremia/hyperchloremia-> improved Hypocalcemia: pseudo Recent Labs  Lab 10/07/17 0455 10/08/17 0500 10/09/17 0500  NA  138 136 138   Recent Labs  Lab 10/07/17 0455 10/08/17 0500 10/09/17 0500  K 3.9 4.0 4.1    Intake/Output Summary (Last 24 hours) at 10/09/2017 0902 Last data filed at 10/09/2017 0500 Gross per 24 hour  Intake 1198.63 ml  Output 50 ml  Net 1148.63 ml     Plan: Free water as needed Attempt to keep euvolemic  Acute encephalopathy w/ hypoactive delirium Left sided weakness- ? More pronounced after cardiac arrest overnight 6/11.  CT head negative. Plan RA SS goal 0 To my sedation  Anemia of critical illness - No evidence of active bleeding currently Recent Labs  10/08/17 0500 10/09/17 0500  HGB 7.6* 8.6*    Plan CBC Transfuse per protocol  Hyperglycemia CBG (last 3)  Recent Labs    10/08/17 2345 10/09/17 0456 10/09/17 0756  GLUCAP 93 95 91    Plan Sliding scale insulin  Vein Thromboses - DVT in RUE brachial vein, SVT in LUE basilic and cephalic veins, DVT in LLE common femoral and femoral veins, negative RLE. Plan Currently on heparin drip, consider changing to p.o. form of anticoagulation  DVT prophylaxis: heparin gtt SUP: H2B IV for now Diet: TF Activity: bedrest  Disposition : ICU  FAMILY  - Updates: Sister, Bethena Roys called and updated 6/16 10/09/2017 no family at [place] [facility] Pager [phone] till 1 pm If no answer page [phone] 10/09/2017, 9:00 AM

## 2017-10-10 ENCOUNTER — Other Ambulatory Visit (HOSPITAL_COMMUNITY): Payer: Self-pay

## 2017-10-10 DIAGNOSIS — Z4682 Encounter for fitting and adjustment of non-vascular catheter: Secondary | ICD-10-CM | POA: Diagnosis not present

## 2017-10-10 DIAGNOSIS — J9690 Respiratory failure, unspecified, unspecified whether with hypoxia or hypercapnia: Secondary | ICD-10-CM | POA: Diagnosis not present

## 2017-10-10 LAB — BASIC METABOLIC PANEL
ANION GAP: 9 (ref 5–15)
BUN: 14 mg/dL (ref 6–20)
CALCIUM: 7.6 mg/dL — AB (ref 8.9–10.3)
CO2: 22 mmol/L (ref 22–32)
Chloride: 107 mmol/L (ref 101–111)
Creatinine, Ser: 1.12 mg/dL (ref 0.61–1.24)
GFR calc Af Amer: >60 mL/min (ref >60)
Glucose, Bld: 89 mg/dL (ref 65–99)
Potassium: 4.1 mmol/L (ref 3.5–5.1)
Sodium: 138 mmol/L (ref 135–145)

## 2017-10-10 LAB — HEPARIN LEVEL (UNFRACTIONATED)
HEPARIN UNFRACTIONATED: 0.26 [IU]/mL — AB (ref 0.30–0.70)
Heparin Unfractionated: 0.1 IU/mL — ABNORMAL LOW (ref 0.30–0.70)
Heparin Unfractionated: 0.16 IU/mL — ABNORMAL LOW (ref 0.30–0.70)

## 2017-10-10 LAB — PROTIME-INR
INR: 1.34
PROTHROMBIN TIME: 16.5 s — AB (ref 11.4–15.2)

## 2017-10-10 LAB — MAGNESIUM: MAGNESIUM: 1.9 mg/dL (ref 1.7–2.4)

## 2017-10-11 LAB — HEPARIN LEVEL (UNFRACTIONATED)
HEPARIN UNFRACTIONATED: 0.59 [IU]/mL (ref 0.30–0.70)
HEPARIN UNFRACTIONATED: 0.19 [IU]/mL — AB (ref 0.30–0.70)
Heparin Unfractionated: 0.23 IU/mL — ABNORMAL LOW (ref 0.30–0.70)

## 2017-10-13 ENCOUNTER — Other Ambulatory Visit (HOSPITAL_COMMUNITY): Payer: Self-pay

## 2017-10-13 DIAGNOSIS — G9341 Metabolic encephalopathy: Secondary | ICD-10-CM

## 2017-10-13 DIAGNOSIS — I469 Cardiac arrest, cause unspecified: Secondary | ICD-10-CM

## 2017-10-13 DIAGNOSIS — I214 Non-ST elevation (NSTEMI) myocardial infarction: Secondary | ICD-10-CM

## 2017-10-13 DIAGNOSIS — K55069 Acute infarction of intestine, part and extent unspecified: Secondary | ICD-10-CM

## 2017-10-13 DIAGNOSIS — I2699 Other pulmonary embolism without acute cor pulmonale: Secondary | ICD-10-CM

## 2017-10-13 DIAGNOSIS — J9621 Acute and chronic respiratory failure with hypoxia: Secondary | ICD-10-CM

## 2017-10-13 DIAGNOSIS — J9690 Respiratory failure, unspecified, unspecified whether with hypoxia or hypercapnia: Secondary | ICD-10-CM | POA: Diagnosis not present

## 2017-10-13 DIAGNOSIS — N179 Acute kidney failure, unspecified: Secondary | ICD-10-CM

## 2017-10-13 LAB — COMPREHENSIVE METABOLIC PANEL
ALT: 15 U/L — AB (ref 17–63)
AST: 25 U/L (ref 15–41)
Albumin: 1.7 g/dL — ABNORMAL LOW (ref 3.5–5.0)
Alkaline Phosphatase: 113 U/L (ref 38–126)
Anion gap: 8 (ref 5–15)
BUN: 13 mg/dL (ref 6–20)
CHLORIDE: 104 mmol/L (ref 101–111)
CO2: 25 mmol/L (ref 22–32)
CREATININE: 1.15 mg/dL (ref 0.61–1.24)
Calcium: 7.8 mg/dL — ABNORMAL LOW (ref 8.9–10.3)
GFR calc Af Amer: >60 mL/min (ref >60)
Glucose, Bld: 116 mg/dL — ABNORMAL HIGH (ref 65–99)
Potassium: 4.3 mmol/L (ref 3.5–5.1)
Sodium: 137 mmol/L (ref 135–145)
Total Bilirubin: 0.6 mg/dL (ref 0.3–1.2)
Total Protein: 5.7 g/dL — ABNORMAL LOW (ref 6.5–8.1)

## 2017-10-13 LAB — CBC
HCT: 31.1 % — ABNORMAL LOW (ref 39.0–52.0)
Hemoglobin: 9.4 g/dL — ABNORMAL LOW (ref 13.0–17.0)
MCH: 27.3 pg (ref 26.0–34.0)
MCHC: 30.2 g/dL (ref 30.0–36.0)
MCV: 90.4 fL (ref 78.0–100.0)
PLATELETS: 371 10*3/uL (ref 150–400)
RBC: 3.44 MIL/uL — AB (ref 4.22–5.81)
RDW: 15.5 % (ref 11.5–15.5)
WBC: 9.5 10*3/uL (ref 4.0–10.5)

## 2017-10-14 ENCOUNTER — Encounter: Payer: Self-pay | Admitting: Internal Medicine

## 2017-10-14 DIAGNOSIS — I2699 Other pulmonary embolism without acute cor pulmonale: Secondary | ICD-10-CM

## 2017-10-14 DIAGNOSIS — Z86711 Personal history of pulmonary embolism: Secondary | ICD-10-CM

## 2017-10-14 DIAGNOSIS — N179 Acute kidney failure, unspecified: Secondary | ICD-10-CM

## 2017-10-14 DIAGNOSIS — G9341 Metabolic encephalopathy: Secondary | ICD-10-CM

## 2017-10-14 DIAGNOSIS — K55069 Acute infarction of intestine, part and extent unspecified: Secondary | ICD-10-CM

## 2017-10-14 DIAGNOSIS — I469 Cardiac arrest, cause unspecified: Secondary | ICD-10-CM

## 2017-10-14 HISTORY — DX: Personal history of pulmonary embolism: Z86.711

## 2017-10-14 HISTORY — DX: Metabolic encephalopathy: G93.41

## 2017-10-14 HISTORY — DX: Other pulmonary embolism without acute cor pulmonale: I26.99

## 2017-10-14 NOTE — Consult Note (Signed)
[facility]  PULMONARY SERVICE  Date of Service: 10/13/2017 PULMONARY CONSULT   Luke Kaufman  IWL:[id number]  DOB: Jul 17, 1950   DOA: 10/09/2017  Referring Physician: Merton Border, MD  HPI: Luke Kaufman is a 67 y.o. male seen for follow up of Acute on Chronic Respiratory Failure.  Patient presented with a very complicated medical history including coronary artery disease history of stroke peripheral arterial disease hyperlipidemia came to the hospital basically with abdominal pain.  Patient was at that time found to have periumbilical pain so was taken to the OR for evaluation.  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  Postoperative course was complicated with the patient developing respiratory failure and cardiac arrest.  Patient apparently had a V. fib arrest CPR was performed for about 11 minutes along with cardioversion x2.  Patient was at that time found to have an acute non-STEMI.  Echocardiogram was done which showed an ejection fraction of 40 to 45 percent with severe hypokinesis.  Patient required ongoing mechanical ventilation and so was therefore transferred to our facility for further management and weaning.  Patient at this time is on T collar has been on 20% oxygen with a #6 XLT.  Review of Systems:  ROS performed and is unremarkable other than noted above.  Past Medical History:  Diagnosis Date  . Acute metabolic encephalopathy 1/74/0814  . Gastric ulcer   . Hyperlipemia   . Hypertension   . MI (myocardial infarction) ([place])   . Pulmonary embolism and infarction ([place]) 10/14/2017  . Stroke ([place])   . Tobacco abuse     Past Surgical History:  Procedure Laterality Date  . APPLICATION OF WOUND VAC N/A 09/04/2017   Procedure: APPLICATION OF WOUND VAC and Exploration of Abdomen.;  Surgeon: Elam Dutch, MD;  Location: [facility];  Service:  Vascular;  Laterality: N/A;  . APPLICATION OF WOUND VAC N/A 09/06/2017   Procedure: APPLICATION OF WOUND VAC;  Surgeon: Georganna Skeans, MD;  Location: [place];  Service: General;  Laterality: N/A;  . APPLICATION OF WOUND VAC N/A 09/08/2017   Procedure: APPLICATION OF WOUND VAC;  Surgeon: Judeth Horn, MD;  Location: [place];  Service: General;  Laterality: N/A;  . APPLICATION OF WOUND VAC N/A 09/12/2017   Procedure: APPLICATION OF WOUND VAC  (CHANGE);  Surgeon: Judeth Horn, MD;  Location: [place];  Service: General;  Laterality: N/A;  . APPLICATION OF WOUND VAC N/A 09/14/2017   Procedure: APPLICATION OF WOUND VAC;  Surgeon: Greer Pickerel, MD;  Location: [place];  Service: General;  Laterality: N/A;  . APPLICATION OF WOUND VAC N/A 09/23/2017   Procedure: APPLICATION OF NEGATIVE PRESSURE THERAPY;  Surgeon: Kieth Brightly Arta Bruce, MD;  Location: [place];  Service: General;  Laterality: N/A;  . BYPASS GRAFT AORTA TO AORTA  09/04/2017   Procedure: Aorta to Superior Mesinteric aorta bypass ultrason Left common Femoral/;  Surgeon: Elam Dutch, MD;  Location: [facility];  Service: Vascular;;  . ESOPHAGOGASTRODUODENOSCOPY N/A 08/09/2014   Procedure: ESOPHAGOGASTRODUODENOSCOPY (EGD);  Surgeon: Jerene Bears, MD;  Location: [facility];  Service: Gastroenterology;  Laterality: N/A;  . ESOPHAGOGASTRODUODENOSCOPY N/A 08/10/2014   Procedure: ESOPHAGOGASTRODUODENOSCOPY (EGD);  Surgeon: Jerene Bears, MD;  Location: [facility];  Service: Gastroenterology;  Laterality: N/A;  . ILEOSTOMY Right 09/06/2017   Procedure: POSSIBLE ILEOSTOMY;  Surgeon: Georganna Skeans, MD;  Location: [place];  Service: General;  Laterality: Right;  .  INSERTION OF MESH N/A 09/23/2017   Procedure: INSERTION OF MESH;  Surgeon: Kinsinger, Arta Bruce, MD;  Location: [place];  Service: General;  Laterality: N/A;  . IR FLUORO GUIDE CV LINE RIGHT  10/04/2017  . IR US GUIDE VASC ACCESS RIGHT  10/04/2017  . LAPAROTOMY Right 09/04/2017   Procedure: EXPLORATORY  LAPAROTOMY Right Colon Resection;  Surgeon: Elam Dutch, MD;  Location: [place];  Service: Vascular;  Laterality: Right;  . LAPAROTOMY N/A 09/06/2017   Procedure: EXPLORATORY LAPAROTOMY;  Surgeon: Georganna Skeans, MD;  Location: [place];  Service: General;  Laterality: N/A;  . LAPAROTOMY N/A 09/08/2017   Procedure: EXPLORATORY LAPAROTOMY, ABDOMINAL WASHOUT, PARTIAL CLOSURE OF ABDOMEN;  Surgeon: Judeth Horn, MD;  Location: [place];  Service: General;  Laterality: N/A;  . LAPAROTOMY N/A 09/10/2017   Procedure: EXPLORATORY LAPAROTOMY WITH POSSIBLE WOUND CLOSURE ABDOMEN;  Surgeon: Judeth Horn, MD;  Location: [place];  Service: General;  Laterality: N/A;  . LAPAROTOMY N/A 09/12/2017   Procedure: EXPLORATORY LAPAROTOMY;  Surgeon: Judeth Horn, MD;  Location: [place];  Service: General;  Laterality: N/A;  . LAPAROTOMY N/A 09/14/2017   Procedure: EXPLORATORY LAPAROTOMY;  Surgeon: Greer Pickerel, MD;  Location: [place];  Service: General;  Laterality: N/A;  . LAPAROTOMY N/A 09/17/2017   Procedure: EXPLORATORY LAPAROTOMY AND PARTIAL CLOSURE OF ABDOMEN;  Surgeon: Judeth Horn, MD;  Location: [place];  Service: General;  Laterality: N/A;  . LAPAROTOMY N/A 09/19/2017   Procedure: EXPLORATORY LAPAROTOMY & DRESSING CHANGE;  Surgeon: Coralie Keens, MD;  Location: [place];  Service: General;  Laterality: N/A;  . LAPAROTOMY N/A 09/23/2017   Procedure: EXPLORATORY LAPAROTOMY, INCISIONAL HERNIA REPAIR WITH MESH INSERTION, PARTIAL CLOSURE OF SKIN;  Surgeon: Kieth Brightly, Arta Bruce, MD;  Location: [place];  Service: General;  Laterality: N/A;  . TRACHEOSTOMY TUBE PLACEMENT N/A 09/12/2017   Procedure: TRACHEOSTOMY;  Surgeon: Judeth Horn, MD;  Location: [place];  Service: General;  Laterality: N/A;  . VISCERAL ANGIOGRAM N/A 09/04/2017   Procedure: Non Selective MESENTERIC ANGIOGRAM,;  Surgeon: Elam Dutch, MD;  Location[facility];  Service: Vascular;  Laterality: N/A;    Social History:    reports that he has been smoking cigarettes.   He has been smoking about 1.50 packs per day. He has never used smokeless tobacco. He reports that he drinks alcohol. He reports that he does not use drugs.  Family History: Non-Contributory to the present illness  No Known Allergies  Medications: Reviewed on Rounds  Physical Exam:  Vitals: Temperature 97.4 pulse 87 respiratory rate 23 blood pressure 122/87 saturations 94%  Ventilator Settings off of the ventilator on T collar currently on 28% FiO2  . General: Comfortable at this time . Eyes: Grossly normal lids, irises & conjunctiva . ENT: grossly tongue is normal . Neck: no obvious mass . Cardiovascular: S1-S2 normal no gallop or rub is noted at this time . Respiratory: No rhonchi expansion is equal . Abdomen: Obese and soft . Skin: no rash seen on limited exam . Musculoskeletal: not rigid . Psychiatric:unable to assess . Neurologic: no seizure no involuntary movements         Labs on Admission:  Basic Metabolic Panel: Recent Labs  Lab 10/08/17 0500 10/09/17 0500 10/09/17 1824 10/10/17 0230 10/13/17 1101  NA 136 138 138 138 137  K 4.0 4.1 5.7* 4.1 4.3  CL 108 110 107 107 104  CO2 22 21* 20* 22 25  GLUCOSE 114* 95 80 89 116*  BUN 18 16 16  14  13  CREATININE 1.20 1.15 1.26* 1.12 1.15  CALCIUM 7.2* 7.6* 8.0* 7.6* 7.8*  MG  --  1.8  --  1.9  --     Liver Function Tests: Recent Labs  Lab 10/08/17 0500 10/09/17 1824 10/13/17 1101  AST 26 39 25  ALT 17 13* 15*  ALKPHOS 100 109 113  BILITOT 0.4 2.0* 0.6  PROT 5.2* 6.2* 5.7*  ALBUMIN 1.8* 2.1* 1.7*   No results for input(s): LIPASE, AMYLASE in the last 168 hours. No results for input(s): AMMONIA in the last 168 hours.  CBC: Recent Labs  Lab 10/08/17 0500 10/09/17 0500 10/09/17 1824 10/13/17 1101  WBC 5.8 6.8 5.3 9.5  NEUTROABS  --   --  3.4  --   HGB 7.6* 8.6* 10.6* 9.4*  HCT 25.5* 28.1* 33.8* 31.1*  MCV 92.4 89.8 90.1 90.4  PLT 219 242 236 371    Cardiac Enzymes: No results for input(s):  CKTOTAL, CKMB, CKMBINDEX, TROPONINI in the last 168 hours.  BNP (last 3 results) No results for input(s): BNP in the last 8760 hours.  ProBNP (last 3 results) No results for input(s): PROBNP in the last 8760 hours.   Radiological Exams on Admission: Dg Chest Port 1 View  Result Date: 10/13/2017 CLINICAL DATA:  Respiratory failure EXAM: PORTABLE CHEST 1 VIEW COMPARISON:  10/10/2017 FINDINGS: Right-sided central line with tip at the upper cavoatrial junction. Gastric suction tube at least reaches the stomach. Tracheostomy tube is well seated. Low volume chest with haziness at the right more than left base, similar to prior. No Kerley lines or air bronchogram. No pneumothorax. Normal heart size IMPRESSION: Unchanged hazy lung opacities compatible with atelectasis and layering pleural fluid seen on 10/01/2017 chest CT. Electronically Signed   By: Monte Fantasia M.D.   On: 10/13/2017 12:14    Assessment/Plan Active Problems:   Acute on chronic respiratory failure with hypoxia (HCC)   Non-STEMI (non-ST elevated myocardial infarction) ([place])   Occlusion of superior mesenteric artery (HCC)   Acute renal failure ([place])   Pulmonary embolism and infarction ([place])   Acute metabolic encephalopathy   Cardiac arrest ([place])   1. Acute on chronic respiratory failure with hypoxia right now patient is on T collar trials.  Has a XLT trach in place will discuss with the primary care team regarding possibility of changing the trach out to a 6 cuffless.  Need to continue to try to advance the weaning as patient is able to tolerate. 2. Non-STEMI stable at this time no active changes noted. 3. 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 4. Acute renal failure we will continue to follow the labs and maintain hydration. 5. Pulmonary embolism infarction stable at this time we will continue to follow 6. Non-STEMI  status post cardiac arrest rhythm has been stable 7. Metabolic encephalopathy unchanged we will continue to monitor  I have personally seen and evaluated the patient, evaluated laboratory and imaging results, formulated the assessment and plan and placed orders. The Patient requires high complexity decision making for assessment and support.  Case was discussed on Rounds with the Respiratory Therapy Staff Time Spent 31minutes  Allyne Gee, MD [facility] Pulmonary Critical Care Medicine Sleep Medicine

## 2017-10-14 NOTE — Progress Notes (Signed)
Pulmonary [place]   PULMONARY SERVICE  PROGRESS NOTE  Date of Service: 10/14/2017  Luke Kaufman  [id number]  DOB: 05-27-1950   DOA: 10/09/2017  Referring Physician: Merton Border, MD  HPI: Luke Kaufman is a 67 y.o. male seen for follow up of Acute on Chronic Respiratory Failure.  Patient is on T collar has been doing fairly well.  No bleeding is been noted.  Patient's on 28% FiO2  Medications: Reviewed on Rounds  Physical Exam:  Vitals: Temperature 97.8 pulse 80 respiratory rate 18 blood pressure 140/76 saturations 98%  Ventilator Settings off of the ventilator on T collar  . General: Comfortable at this time . Eyes: Grossly normal lids, irises & conjunctiva . ENT: grossly tongue is normal . Neck: no obvious mass . Cardiovascular: S1 S2 normal no gallop . Respiratory: No rhonchi or rales are noted at this time . Abdomen: soft . Skin: no rash seen on limited exam . Musculoskeletal: not rigid . Psychiatric:unable to assess . Neurologic: no seizure no involuntary movements         Lab Data:   Basic Metabolic Panel: Recent Labs  Lab 10/08/17 0500 10/09/17 0500 10/09/17 1824 10/10/17 0230 10/13/17 1101  NA 136 138 138 138 137  K 4.0 4.1 5.7* 4.1 4.3  CL 108 110 107 107 104  CO2 22 21* 20* 22 25  GLUCOSE 114* 95 80 89 116*  BUN 18 16 16 14 13   CREATININE 1.20 1.15 1.26* 1.12 1.15  CALCIUM 7.2* 7.6* 8.0* 7.6* 7.8*  MG  --  1.8  --  1.9  --     Liver Function Tests: Recent Labs  Lab 10/08/17 0500 10/09/17 1824 10/13/17 1101  AST 26 39 25  ALT 17 13* 15*  ALKPHOS 100 109 113  BILITOT 0.4 2.0* 0.6  PROT 5.2* 6.2* 5.7*  ALBUMIN 1.8* 2.1* 1.7*   No results for input(s): LIPASE, AMYLASE in the last 168 hours. No results for input(s): AMMONIA in the last 168 hours.  CBC: Recent Labs  Lab 10/08/17 0500 10/09/17 0500 10/09/17 1824 10/13/17 1101  WBC 5.8 6.8 5.3 9.5  NEUTROABS  --   --  3.4  --   HGB  7.6* 8.6* 10.6* 9.4*  HCT 25.5* 28.1* 33.8* 31.1*  MCV 92.4 89.8 90.1 90.4  PLT 219 242 236 371    Cardiac Enzymes: No results for input(s): CKTOTAL, CKMB, CKMBINDEX, TROPONINI in the last 168 hours.  BNP (last 3 results) No results for input(s): BNP in the last 8760 hours.  ProBNP (last 3 results) No results for input(s): PROBNP in the last 8760 hours.  Radiological Exams: Dg Chest Port 1 View  Result Date: 10/13/2017 CLINICAL DATA:  Respiratory failure EXAM: PORTABLE CHEST 1 VIEW COMPARISON:  10/10/2017 FINDINGS: Right-sided central line with tip at the upper cavoatrial junction. Gastric suction tube at least reaches the stomach. Tracheostomy tube is well seated. Low volume chest with haziness at the right more than left base, similar to prior. No Kerley lines or air bronchogram. No pneumothorax. Normal heart size IMPRESSION: Unchanged hazy lung opacities compatible with atelectasis and layering pleural fluid seen on 10/01/2017 chest CT. Electronically Signed   By: Monte Fantasia M.D.   On: 10/13/2017 12:14    Assessment/Plan Active Problems:   Acute on chronic respiratory failure with hypoxia (HCC)   Non-STEMI (non-ST elevated myocardial infarction) ([place])   Occlusion of superior mesenteric artery (HCC)   Acute renal failure (HCC)   Pulmonary  embolism and infarction [facility])   Acute metabolic encephalopathy   Cardiac arrest ([place])   1. Acute on chronic respiratory failure with hypoxia we will continue weaning on T collar patient's also been tolerating the PMV and has actually been able to speak the family is pleased with the progress so far.  Secretions are still copious 2. Non-STEMI no active chest pain 3. Occlusion of the superior mesenteric artery stable status post hemicolectomy 4. Acute renal failure followed by labs will continue with present management. 5. Metabolic encephalopathy clinically somewhat better need to continue to monitor 6. Status post cardiac arrest patient  is at baseline at this time 7. Pulmonary embolism stable   I have personally seen and evaluated the patient, evaluated laboratory and imaging results, formulated the assessment and plan and placed orders. The Patient requires high complexity decision making for assessment and support.  Case was discussed on Rounds with the Respiratory Therapy Staff  Allyne Gee, MD [facility] Pulmonary Critical Care Medicine Sleep Medicine

## 2017-10-15 LAB — CULTURE, RESPIRATORY

## 2017-10-15 LAB — CULTURE, RESPIRATORY W GRAM STAIN

## 2017-10-15 NOTE — Progress Notes (Signed)
Pulmonary [place]   PULMONARY SERVICE  PROGRESS NOTE  Date of Service: 10/15/2017  Luke Kaufman  [id number]  DOB: 02/21/1951   DOA: 10/09/2017  Referring Physician: Merton Border, MD  HPI: Luke Kaufman is a 67 y.o. male seen for follow up of Acute on Chronic Respiratory Failure.  Patient is doing very well with T collar has been tolerating PMV no distress is noted patient has been on 28% FiO2  Medications: Reviewed on Rounds  Physical Exam:  Vitals: Temperature 97.4 pulse 89 respiratory rate 17 blood pressure 140/66 saturations 100%  Ventilator Settings off of the ventilator on T collar  . General: Comfortable at this time . Eyes: Grossly normal lids, irises & conjunctiva . ENT: grossly tongue is normal . Neck: no obvious mass . Cardiovascular: S1 S2 normal no gallop . Respiratory: Good air entry no rhonchi . Abdomen: soft . Skin: no rash seen on limited exam . Musculoskeletal: not rigid . Psychiatric:unable to assess . Neurologic: no seizure no involuntary movements         Lab Data:   Basic Metabolic Panel: Recent Labs  Lab 10/09/17 0500 10/09/17 1824 10/10/17 0230 10/13/17 1101  NA 138 138 138 137  K 4.1 5.7* 4.1 4.3  CL 110 107 107 104  CO2 21* 20* 22 25  GLUCOSE 95 80 89 116*  BUN 16 16 14 13   CREATININE 1.15 1.26* 1.12 1.15  CALCIUM 7.6* 8.0* 7.6* 7.8*  MG 1.8  --  1.9  --     Liver Function Tests: Recent Labs  Lab 10/09/17 1824 10/13/17 1101  AST 39 25  ALT 13* 15*  ALKPHOS 109 113  BILITOT 2.0* 0.6  PROT 6.2* 5.7*  ALBUMIN 2.1* 1.7*   No results for input(s): LIPASE, AMYLASE in the last 168 hours. No results for input(s): AMMONIA in the last 168 hours.  CBC: Recent Labs  Lab 10/09/17 0500 10/09/17 1824 10/13/17 1101  WBC 6.8 5.3 9.5  NEUTROABS  --  3.4  --   HGB 8.6* 10.6* 9.4*  HCT 28.1* 33.8* 31.1*  MCV 89.8 90.1 90.4  PLT 242 236 371    Cardiac Enzymes: No results for  input(s): CKTOTAL, CKMB, CKMBINDEX, TROPONINI in the last 168 hours.  BNP (last 3 results) No results for input(s): BNP in the last 8760 hours.  ProBNP (last 3 results) No results for input(s): PROBNP in the last 8760 hours.  Radiological Exams: No results found.  Assessment/Plan Active Problems:   Acute on chronic respiratory failure with hypoxia (HCC)   Non-STEMI (non-ST elevated myocardial infarction) (HCC)   Occlusion of superior mesenteric artery (HCC)   Acute renal failure ([place])   Pulmonary embolism and infarction ([place])   Acute metabolic encephalopathy   Cardiac arrest ([place])   1. Acute on chronic respiratory failure with hypoxia patient is doing well with the weaning hopefully will be able to start capping trials today 2. Non-STEMI stable at this time 3. Occlusion of superior mesenteric artery stable 4. Acute renal failure follow labs improving 5. Pulmonary embolism treated 6. Metabolic encephalopathy clinically improving 7. Cardiac arrest stable rhythm   I have personally seen and evaluated the patient, evaluated laboratory and imaging results, formulated the assessment and plan and placed orders. The Patient requires high complexity decision making for assessment and support.  Case was discussed on Rounds with the Respiratory Therapy Staff  Allyne Gee, MD [facility] Pulmonary Critical Care Medicine Sleep Medicine

## 2017-10-16 LAB — PREPARE RBC (CROSSMATCH)

## 2017-10-16 LAB — HEMOGLOBIN AND HEMATOCRIT, BLOOD
HEMATOCRIT: 23.1 % — AB (ref 39.0–52.0)
HEMOGLOBIN: 7 g/dL — AB (ref 13.0–17.0)

## 2017-10-17 LAB — CBC
HCT: 17.3 % — ABNORMAL LOW (ref 39.0–52.0)
HEMOGLOBIN: 5.4 g/dL — AB (ref 13.0–17.0)
MCH: 28 pg (ref 26.0–34.0)
MCHC: 31.2 g/dL (ref 30.0–36.0)
MCV: 89.6 fL (ref 78.0–100.0)
PLATELETS: 270 10*3/uL (ref 150–400)
RBC: 1.93 MIL/uL — AB (ref 4.22–5.81)
RDW: 15 % (ref 11.5–15.5)
WBC: 43.9 10*3/uL — AB (ref 4.0–10.5)

## 2017-10-17 LAB — PREPARE RBC (CROSSMATCH)

## 2017-10-17 LAB — BASIC METABOLIC PANEL
ANION GAP: 8 (ref 5–15)
BUN: 22 mg/dL (ref 8–23)
CHLORIDE: 103 mmol/L (ref 98–111)
CO2: 23 mmol/L (ref 22–32)
Calcium: 7.1 mg/dL — ABNORMAL LOW (ref 8.9–10.3)
Creatinine, Ser: 1.48 mg/dL — ABNORMAL HIGH (ref 0.61–1.24)
GFR calc Af Amer: 55 mL/min — ABNORMAL LOW (ref >60)
GFR, EST NON AFRICAN AMERICAN: 47 mL/min — AB (ref >60)
Glucose, Bld: 148 mg/dL — ABNORMAL HIGH (ref 70–99)
POTASSIUM: 5.1 mmol/L (ref 3.5–5.1)
SODIUM: 134 mmol/L — AB (ref 135–145)

## 2017-10-17 NOTE — Progress Notes (Signed)
Pulmonary [place]   PULMONARY SERVICE  PROGRESS NOTE  Date of Service: 10/17/2017  Luke Kaufman  BJS:[id number]  DOB: [birth year]   DOA: 10/09/2017  Referring Physician: Merton Border, MD  HPI: Luke Kaufman is a 67 y.o. male seen for follow up of Acute on Chronic Respiratory Failure.  Patient remains on T collar has been on 20% oxygen tolerating it fairly well.  The patient unfortunately had a rapid response yesterday after a capping attempt.  Therefore I think we will hold off on capping for now  Medications: Reviewed on Rounds  Physical Exam:  Vitals: Temperature 98.3 pulse 103 respiratory rate 19 blood pressure 97/38 saturations 100%  Ventilator Settings off the ventilator on T collar  . General: Comfortable at this time . Eyes: Grossly normal lids, irises & conjunctiva . ENT: grossly tongue is normal . Neck: no obvious mass . Cardiovascular: S1 S2 normal no gallop . Respiratory: No rhonchi noted at this time . Abdomen: soft . Skin: no rash seen on limited exam . Musculoskeletal: not rigid . Psychiatric:unable to assess . Neurologic: no seizure no involuntary movements         Lab Data:   Basic Metabolic Panel: Recent Labs  Lab 10/13/17 1101  NA 137  K 4.3  CL 104  CO2 25  GLUCOSE 116*  BUN 13  CREATININE 1.15  CALCIUM 7.8*    Liver Function Tests: Recent Labs  Lab 10/13/17 1101  AST 25  ALT 15*  ALKPHOS 113  BILITOT 0.6  PROT 5.7*  ALBUMIN 1.7*   No results for input(s): LIPASE, AMYLASE in the last 168 hours. No results for input(s): AMMONIA in the last 168 hours.  CBC: Recent Labs  Lab 10/13/17 1101 10/16/17 2020  WBC 9.5  --   HGB 9.4* 7.0*  HCT 31.1* 23.1*  MCV 90.4  --   PLT 371  --     Cardiac Enzymes: No results for input(s): CKTOTAL, CKMB, CKMBINDEX, TROPONINI in the last 168 hours.  BNP (last 3 results) No results for input(s): BNP in the last 8760 hours.  ProBNP (last 3  results) No results for input(s): PROBNP in the last 8760 hours.  Radiological Exams: No results found.  Assessment/Plan Active Problems:   Acute on chronic respiratory failure with hypoxia (HCC)   Non-STEMI (non-ST elevated myocardial infarction) (HCC)   Occlusion of superior mesenteric artery (HCC)   Acute renal failure ([place])   Pulmonary embolism and infarction ([place])   Acute metabolic encephalopathy   Cardiac arrest ([place])   1. Acute on chronic respiratory failure with hypoxia we will continue with T collar continue pulmonary toilet secretion management overall prognosis quite guarded. 2. Non-STEMI had a rapid response not cardiac related more respiratory related.  We will continue with supportive care hold off on capping for now. 3. Acute renal failure treated we will continue to follow 4. Acute metabolic encephalopathy clinically improving 5. Status post cardiac arrest no arrhythmias noted at this time   I have personally seen and evaluated the patient, evaluated laboratory and imaging results, formulated the assessment and plan and placed orders. The Patient requires high complexity decision making for assessment and support.  Case was discussed on Rounds with the Respiratory Therapy Staff  Allyne Gee, MD [facility] Pulmonary Critical Care Medicine Sleep Medicine

## 2017-10-18 ENCOUNTER — Other Ambulatory Visit (HOSPITAL_COMMUNITY): Payer: Self-pay

## 2017-10-18 DIAGNOSIS — J9690 Respiratory failure, unspecified, unspecified whether with hypoxia or hypercapnia: Secondary | ICD-10-CM | POA: Diagnosis not present

## 2017-10-18 DIAGNOSIS — J90 Pleural effusion, not elsewhere classified: Secondary | ICD-10-CM | POA: Diagnosis not present

## 2017-10-18 LAB — BPAM RBC
BLOOD PRODUCT EXPIRATION DATE: 201907272359
Blood Product Expiration Date: 201907282359
Blood Product Expiration Date: 201907292359
ISSUE DATE / TIME: 201906281541
ISSUE DATE / TIME: 201906280238
ISSUE DATE / TIME: 201906281822
UNIT TYPE AND RH: 5100
Unit Type and Rh: 5100
Unit Type and Rh: 5100

## 2017-10-18 LAB — CBC
HCT: 23.9 % — ABNORMAL LOW (ref 39.0–52.0)
Hemoglobin: 7.6 g/dL — ABNORMAL LOW (ref 13.0–17.0)
MCH: 28.1 pg (ref 26.0–34.0)
MCHC: 31.8 g/dL (ref 30.0–36.0)
MCV: 88.5 fL (ref 78.0–100.0)
Platelets: 230 10*3/uL (ref 150–400)
RBC: 2.7 MIL/uL — ABNORMAL LOW (ref 4.22–5.81)
RDW: 14.9 % (ref 11.5–15.5)
WBC: 36.6 10*3/uL — ABNORMAL HIGH (ref 4.0–10.5)

## 2017-10-18 LAB — TYPE AND SCREEN
ABO/RH(D): O POS
Antibody Screen: NEGATIVE
UNIT DIVISION: 0
Unit division: 0
Unit division: 0

## 2017-10-18 LAB — PROTIME-INR
INR: 1.81
Prothrombin Time: 20.8 seconds — ABNORMAL HIGH (ref 11.4–15.2)

## 2017-10-18 NOTE — Progress Notes (Signed)
Pulmonary [place]   PULMONARY SERVICE  PROGRESS NOTE  Date of Service: 10/18/2017  Luke Kaufman  YHC:[id number]  DOB: 13-Feb-1951   DOA: 10/09/2017  Referring Physician: Merton Border, MD  HPI: Luke Kaufman is a 67 y.o. male seen for follow up of Acute on Chronic Respiratory Failure.  Patient has been having issues with GI bleeding cared has been requiring transfusions right now is on T collar is comfortable without distress  Medications: Reviewed on Rounds  Physical Exam:  Vitals:  Temperature 97.0 degrees pulse 93 respiratory 21 blood pressure 110/64 saturations 100%  Ventilator Settings  Currently off of the ventilator on T collar has been on 28% oxygen  . General: Comfortable at this time . Eyes: Grossly normal lids, irises & conjunctiva . ENT: grossly tongue is normal . Neck: no obvious mass . Cardiovascular: S1 S2 normal no gallop . Respiratory:  No rhonchi or rales are noted at this time . Abdomen: soft . Skin: no rash seen on limited exam . Musculoskeletal: not rigid . Psychiatric:unable to assess . Neurologic: no seizure no involuntary movements         Lab Data:   Basic Metabolic Panel: Recent Labs  Lab 10/13/17 1101 10/17/17 1433  NA 137 134*  K 4.3 5.1  CL 104 103  CO2 25 23  GLUCOSE 116* 148*  BUN 13 22  CREATININE 1.15 1.48*  CALCIUM 7.8* 7.1*    Liver Function Tests: Recent Labs  Lab 10/13/17 1101  AST 25  ALT 15*  ALKPHOS 113  BILITOT 0.6  PROT 5.7*  ALBUMIN 1.7*   No results for input(s): LIPASE, AMYLASE in the last 168 hours. No results for input(s): AMMONIA in the last 168 hours.  CBC: Recent Labs  Lab 10/13/17 1101 10/16/17 2020 10/17/17 1433 10/18/17 0748  WBC 9.5  --  43.9* 36.6*  HGB 9.4* 7.0* 5.4* 7.6*  HCT 31.1* 23.1* 17.3* 23.9*  MCV 90.4  --  89.6 88.5  PLT 371  --  270 230    Cardiac Enzymes: No results for input(s): CKTOTAL, CKMB, CKMBINDEX, TROPONINI in the  last 168 hours.  BNP (last 3 results) No results for input(s): BNP in the last 8760 hours.  ProBNP (last 3 results) No results for input(s): PROBNP in the last 8760 hours.  Radiological Exams: Dg Chest Port 1 View  Result Date: 10/18/2017 CLINICAL DATA:  Respiratory failure EXAM: PORTABLE CHEST 1 VIEW COMPARISON:  October 13, 2017 FINDINGS: A tracheostomy tube is in good position. An NG tube terminates below today's film. No pneumothorax. Bilateral effusions with underlying opacities are similar to slightly worsened in the interval. No other interval changes. IMPRESSION: Bilateral pleural effusions with underlying opacities are similar to slightly worsened in the interval. No other changes. Electronically Signed   By: Dorise Bullion III M.D   On: 10/18/2017 16:43    Assessment/Plan Active Problems:   Acute on chronic respiratory failure with hypoxia (HCC)   Non-STEMI (non-ST elevated myocardial infarction) ([place])   Occlusion of superior mesenteric artery (HCC)   Acute renal failure ([place])   Pulmonary embolism and infarction ([place])   Acute metabolic encephalopathy   Cardiac arrest ([place])   1.  acute on chronic Respiratory failure with hypoxia will continue with T collar continue pulmonary toilet supportive care overall prognosis guarded.   2. Occlusion is clear mesenteric artery status post surgical rate repair hemicolectomy.   3. Acute renal failure Will continue to follow the labs closely stable  4. Metabolic encephalopathy grossly unchanged  5. Pulmonary embolism and infarction patient is doing better stable  6. On non STEMI stable at this time no further chest pain  7. Cardiac arrest no rhythm E is noted   I have personally seen and evaluated the patient, evaluated laboratory and imaging results, formulated the assessment and plan and placed orders. The Patient requires high complexity decision making for assessment and support.  Case was discussed on Rounds with the Respiratory  Therapy Staff  Allyne Gee, MD [facility] Pulmonary Critical Care Medicine Sleep Medicine

## 2017-10-19 DIAGNOSIS — J90 Pleural effusion, not elsewhere classified: Secondary | ICD-10-CM

## 2017-10-19 LAB — CBC
HCT: 20.1 % — ABNORMAL LOW (ref 39.0–52.0)
HEMOGLOBIN: 6.3 g/dL — AB (ref 13.0–17.0)
MCH: 27.9 pg (ref 26.0–34.0)
MCHC: 31.3 g/dL (ref 30.0–36.0)
MCV: 88.9 fL (ref 78.0–100.0)
PLATELETS: 275 10*3/uL (ref 150–400)
RBC: 2.26 MIL/uL — AB (ref 4.22–5.81)
RDW: 15.4 % (ref 11.5–15.5)
WBC: 27.5 10*3/uL — AB (ref 4.0–10.5)

## 2017-10-19 LAB — PREPARE RBC (CROSSMATCH)

## 2017-10-19 NOTE — Progress Notes (Signed)
Pulmonary [place]   PULMONARY SERVICE  PROGRESS NOTE  Date of Service: 10/19/2017  Luke Kaufman  RCV:[id number]  DOB: 1951-03-01   DOA: 10/09/2017  Referring Physician: Merton Border, MD  HPI: Luke Kaufman is a 67 y.o. male seen for follow up of Acute on Chronic Respiratory Failure.  Patient right now is on T collar has been having increased congestion noted.  Sputum production is also increased.  Look at the chest x-ray in the parenchyma looks fairly clear however patient does have bilateral lower haziness with increased pleural effusions.  I think it may be beneficial to assess with a CT of the chest at this point to see how much fluid is actually there.  Currently remains on T collar has been on 28% FiO2  Medications: Reviewed on Rounds  Physical Exam:  Vitals: Temperature 98.5 pulse 84 respiratory rate 18 blood pressure 110/59 saturations 100%  Ventilator Settings T collar FiO2 28%  . General: Comfortable at this time . Eyes: Grossly normal lids, irises & conjunctiva . ENT: grossly tongue is normal . Neck: no obvious mass . Cardiovascular: S1 S2 normal no gallop . Respiratory: No rhonchi noted . Abdomen: soft . Skin: no rash seen on limited exam . Musculoskeletal: not rigid . Psychiatric:unable to assess . Neurologic: no seizure no involuntary movements         Lab Data:   Basic Metabolic Panel: Recent Labs  Lab 10/13/17 1101 10/17/17 1433  NA 137 134*  K 4.3 5.1  CL 104 103  CO2 25 23  GLUCOSE 116* 148*  BUN 13 22  CREATININE 1.15 1.48*  CALCIUM 7.8* 7.1*    Liver Function Tests: Recent Labs  Lab 10/13/17 1101  AST 25  ALT 15*  ALKPHOS 113  BILITOT 0.6  PROT 5.7*  ALBUMIN 1.7*   No results for input(s): LIPASE, AMYLASE in the last 168 hours. No results for input(s): AMMONIA in the last 168 hours.  CBC: Recent Labs  Lab 10/13/17 1101 10/16/17 2020 10/17/17 1433 10/18/17 0748 10/19/17 0827   WBC 9.5  --  43.9* 36.6* 27.5*  HGB 9.4* 7.0* 5.4* 7.6* 6.3*  HCT 31.1* 23.1* 17.3* 23.9* 20.1*  MCV 90.4  --  89.6 88.5 88.9  PLT 371  --  270 230 275    Cardiac Enzymes: No results for input(s): CKTOTAL, CKMB, CKMBINDEX, TROPONINI in the last 168 hours.  BNP (last 3 results) No results for input(s): BNP in the last 8760 hours.  ProBNP (last 3 results) No results for input(s): PROBNP in the last 8760 hours.  Radiological Exams: Dg Chest Port 1 View  Result Date: 10/18/2017 CLINICAL DATA:  Respiratory failure EXAM: PORTABLE CHEST 1 VIEW COMPARISON:  October 13, 2017 FINDINGS: A tracheostomy tube is in good position. An NG tube terminates below today's film. No pneumothorax. Bilateral effusions with underlying opacities are similar to slightly worsened in the interval. No other interval changes. IMPRESSION: Bilateral pleural effusions with underlying opacities are similar to slightly worsened in the interval. No other changes. Electronically Signed   By: Dorise Bullion III M.D   On: 10/18/2017 16:43    Assessment/Plan Active Problems:   Acute on chronic respiratory failure with hypoxia (HCC)   Non-STEMI (non-ST elevated myocardial infarction) ([place])   Occlusion of superior mesenteric artery ([place])   Acute renal failure ([place])   Pulmonary embolism and infarction ([place])   Acute metabolic encephalopathy   Cardiac arrest ([place])   1. Acute on chronic respiratory  failure with hypoxia comfortable on T, collar however has not been able to progress any further because of increased sputum production and congestion.  In the setting of a chest x-ray that looks somewhat slightly worse. 2. Pulmonary embolism infarction treated we will monitor 3. Cardiac arrest rhythm has been normal 4. Non-STEMI stable 5. Bilateral pleural effusions I would recommend getting a CT scan and possibly consider doing thoracentesis for diagnostic tap.  This may actually help [place] with trying to wean him further. 6. Acute  renal failure follow labs the creatinine is at 1.48 now   I have personally seen and evaluated the patient, evaluated laboratory and imaging results, formulated the assessment and plan and placed orders. The Patient requires high complexity decision making for assessment and support.  Case was discussed on Rounds with the Respiratory Therapy Staff time spent 35 minutes after personally reviewed all the radiological data and discussed the case on rounds with the medical staff and treatment team  Allyne Gee, MD [facility] Pulmonary Critical Care Medicine Sleep Medicine

## 2017-10-20 LAB — TYPE AND SCREEN
ABO/RH(D): O POS
Antibody Screen: NEGATIVE
Unit division: 0

## 2017-10-20 LAB — BPAM RBC
BLOOD PRODUCT EXPIRATION DATE: 201907312359
ISSUE DATE / TIME: 201906301518
Unit Type and Rh: 5100

## 2017-10-20 LAB — VANCOMYCIN, TROUGH: Vancomycin Tr: 38 ug/mL (ref 15–20)

## 2017-10-20 LAB — HEMOGLOBIN AND HEMATOCRIT, BLOOD
HEMATOCRIT: 24.1 % — AB (ref 39.0–52.0)
HEMOGLOBIN: 7.3 g/dL — AB (ref 13.0–17.0)

## 2017-10-20 NOTE — Progress Notes (Signed)
Pulmonary [place]   PULMONARY SERVICE  PROGRESS NOTE  Date of Service: 10/20/2017  Jaxxen Voong  GXQ:[id number]  DOB: 12-29-1950   DOA: 10/09/2017  Referring Physician: Merton Border, MD  HPI: Kaymen Adrian is a 67 y.o. male seen for follow up of Acute on Chronic Respiratory Failure.  He is on T collar currently is been on 20% oxygen.  Currently still has a cuffed trach in place.  This will need to be changed  Medications: Reviewed on Rounds  Physical Exam:  Vitals: Temperature 97.2 pulse 78 respiratory rate 16 blood pressure 115/56 saturations 100%  Ventilator Settings off of the ventilator on T collar trials at this time  . General: Comfortable at this time . Eyes: Grossly normal lids, irises & conjunctiva . ENT: grossly tongue is normal . Neck: no obvious mass . Cardiovascular: S1 S2 normal no gallop . Respiratory: No rhonchi or rales are noted . Abdomen: soft . Skin: no rash seen on limited exam . Musculoskeletal: not rigid . Psychiatric:unable to assess . Neurologic: no seizure no involuntary movements         Lab Data:   Basic Metabolic Panel: Recent Labs  Lab 10/17/17 1433  NA 134*  K 5.1  CL 103  CO2 23  GLUCOSE 148*  BUN 22  CREATININE 1.48*  CALCIUM 7.1*    Liver Function Tests: No results for input(s): AST, ALT, ALKPHOS, BILITOT, PROT, ALBUMIN in the last 168 hours. No results for input(s): LIPASE, AMYLASE in the last 168 hours. No results for input(s): AMMONIA in the last 168 hours.  CBC: Recent Labs  Lab 10/16/17 2020 10/17/17 1433 10/18/17 0748 10/19/17 0827 10/20/17 0614  WBC  --  43.9* 36.6* 27.5*  --   HGB 7.0* 5.4* 7.6* 6.3* 7.3*  HCT 23.1* 17.3* 23.9* 20.1* 24.1*  MCV  --  89.6 88.5 88.9  --   PLT  --  270 230 275  --     Cardiac Enzymes: No results for input(s): CKTOTAL, CKMB, CKMBINDEX, TROPONINI in the last 168 hours.  BNP (last 3 results) No results for input(s): BNP in  the last 8760 hours.  ProBNP (last 3 results) No results for input(s): PROBNP in the last 8760 hours.  Radiological Exams: Dg Chest Port 1 View  Result Date: 10/18/2017 CLINICAL DATA:  Respiratory failure EXAM: PORTABLE CHEST 1 VIEW COMPARISON:  October 13, 2017 FINDINGS: A tracheostomy tube is in good position. An NG tube terminates below today's film. No pneumothorax. Bilateral effusions with underlying opacities are similar to slightly worsened in the interval. No other interval changes. IMPRESSION: Bilateral pleural effusions with underlying opacities are similar to slightly worsened in the interval. No other changes. Electronically Signed   By: Dorise Bullion III M.D   On: 10/18/2017 16:43    Assessment/Plan Active Problems:   Acute on chronic respiratory failure with hypoxia (HCC)   Non-STEMI (non-ST elevated myocardial infarction) ([place])   Occlusion of superior mesenteric artery ([place])   Acute renal failure ([place])   Pulmonary embolism and infarction ([place])   Acute metabolic encephalopathy   Cardiac arrest ([place])   1. Acute on chronic respiratory failure with hypoxia we will continue with T collar trials.  I recommended going ahead to change the tracheostomy to a cuffless trach at this time 2. Non-STEMI stable 3. Superior mesenteric artery occlusion status post resection we will continue present management. 4. Acute renal failure labs have been stable we will monitor 5. Pulmonary embolism treated  6. Metabolic encephalopathy clinically improved 7. Cardiac arrest stable   I have personally seen and evaluated the patient, evaluated laboratory and imaging results, formulated the assessment and plan and placed orders. The Patient requires high complexity decision making for assessment and support.  Case was discussed on Rounds with the Respiratory Therapy Staff  Allyne Gee, MD [facility] Pulmonary Critical Care Medicine Sleep Medicine

## 2017-10-21 LAB — CULTURE, RESPIRATORY: CULTURE: NORMAL

## 2017-10-21 LAB — CULTURE, RESPIRATORY W GRAM STAIN

## 2017-10-21 LAB — VANCOMYCIN, TROUGH: VANCOMYCIN TR: 17 ug/mL (ref 15–20)

## 2017-10-21 NOTE — Progress Notes (Signed)
Pulmonary [place]   PULMONARY SERVICE  PROGRESS NOTE  Date of Service: 10/21/2017  Luke Kaufman  NLG:[id number]  DOB: [birth year]   DOA: 10/09/2017  Referring Physician: Merton Border, MD  HPI: Luke Kaufman is a 67 y.o. male seen for follow up of Acute on Chronic Respiratory Failure.  Currently is on T collar has been using the PMV but not able to tolerate capping.  Patient will be continued on the T collar with PMV  Medications: Reviewed on Rounds  Physical Exam:  Vitals:  Temperature 98.1 degrees pulse 81 respiratory rate 20 blood pressure 126/59 saturations 98%  Ventilator Settings  Off of the ventilator on T collar currently requiring 28% oxygen.  Also is tolerating the PMV  . General: Comfortable at this time . Eyes: Grossly normal lids, irises & conjunctiva . ENT: grossly tongue is normal . Neck: no obvious mass . Cardiovascular: S1 S2 normal no gallop . Respiratory:  No rhonchi noted at this time . Abdomen: soft . Skin: no rash seen on limited exam . Musculoskeletal: not rigid . Psychiatric:unable to assess . Neurologic: no seizure no involuntary movements         Lab Data:   Basic Metabolic Panel: Recent Labs  Lab 10/17/17 1433  NA 134*  K 5.1  CL 103  CO2 23  GLUCOSE 148*  BUN 22  CREATININE 1.48*  CALCIUM 7.1*    Liver Function Tests: No results for input(s): AST, ALT, ALKPHOS, BILITOT, PROT, ALBUMIN in the last 168 hours. No results for input(s): LIPASE, AMYLASE in the last 168 hours. No results for input(s): AMMONIA in the last 168 hours.  CBC: Recent Labs  Lab 10/16/17 2020 10/17/17 1433 10/18/17 0748 10/19/17 0827 10/20/17 0614  WBC  --  43.9* 36.6* 27.5*  --   HGB 7.0* 5.4* 7.6* 6.3* 7.3*  HCT 23.1* 17.3* 23.9* 20.1* 24.1*  MCV  --  89.6 88.5 88.9  --   PLT  --  270 230 275  --     Cardiac Enzymes: No results for input(s): CKTOTAL, CKMB, CKMBINDEX, TROPONINI in the last 168  hours.  BNP (last 3 results) No results for input(s): BNP in the last 8760 hours.  ProBNP (last 3 results) No results for input(s): PROBNP in the last 8760 hours.  Radiological Exams: No results found.  Assessment/Plan Active Problems:   Acute on chronic respiratory failure with hypoxia (HCC)   Non-STEMI (non-ST elevated myocardial infarction) (HCC)   Occlusion of superior mesenteric artery (HCC)   Acute renal failure (HCC)   Pulmonary embolism and infarction (HCC)   Acute metabolic encephalopathy   Cardiac arrest (HCC)   1.  acute on chronic Respiratory failure with hypoxia will continue with PMV as ordered along with the T collar.  This is probably going to be baseline because of failure to cap.   2. Non STEMI stable  3. Acute superior mesenteric artery occlusion status post hemicolectomy.  Will continue present management.   4. Acute renal failure labs are improving 5. Acute metabolic encephalopathy seems to show some clinical improvement 6. Status post cardiac arrest patient is at baseline we will monitor   I have personally seen and evaluated the patient, evaluated laboratory and imaging results, formulated the assessment and plan and placed orders. The Patient requires high complexity decision making for assessment and support.  Case was discussed on Rounds with the Respiratory Therapy Staff  Allyne Gee, MD [facility] Pulmonary Critical Care Medicine Sleep Medicine

## 2017-10-22 NOTE — Progress Notes (Signed)
Pulmonary [place]   PULMONARY SERVICE  PROGRESS NOTE  Date of Service: 10/22/2017  Luke Kaufman  AST:[id number]  DOB: October 07, 1950   DOA: 10/09/2017  Referring Physician: Merton Border, MD  HPI: Luke Kaufman is a 67 y.o. male seen for follow up of Acute on Chronic Respiratory Failure.  Patient remains on T collar has been on 28% FiO2.  Has been tolerating PMV fairly well also.  Medications: Reviewed on Rounds  Physical Exam:  Vitals: Temperature 96.8 pulse 81 respiratory rate 14 blood pressure 116/56 saturations 97%  Ventilator Settings currently off of the ventilator on T collar  . General: Comfortable at this time . Eyes: Grossly normal lids, irises & conjunctiva . ENT: grossly tongue is normal . Neck: no obvious mass . Cardiovascular: S1 S2 normal no gallop . Respiratory: No rhonchi or rales are noted . Abdomen: soft . Skin: no rash seen on limited exam . Musculoskeletal: not rigid . Psychiatric:unable to assess . Neurologic: no seizure no involuntary movements         Lab Data:   Basic Metabolic Panel: Recent Labs  Lab 10/17/17 1433  NA 134*  K 5.1  CL 103  CO2 23  GLUCOSE 148*  BUN 22  CREATININE 1.48*  CALCIUM 7.1*    Liver Function Tests: No results for input(s): AST, ALT, ALKPHOS, BILITOT, PROT, ALBUMIN in the last 168 hours. No results for input(s): LIPASE, AMYLASE in the last 168 hours. No results for input(s): AMMONIA in the last 168 hours.  CBC: Recent Labs  Lab 10/16/17 2020 10/17/17 1433 10/18/17 0748 10/19/17 0827 10/20/17 0614  WBC  --  43.9* 36.6* 27.5*  --   HGB 7.0* 5.4* 7.6* 6.3* 7.3*  HCT 23.1* 17.3* 23.9* 20.1* 24.1*  MCV  --  89.6 88.5 88.9  --   PLT  --  270 230 275  --     Cardiac Enzymes: No results for input(s): CKTOTAL, CKMB, CKMBINDEX, TROPONINI in the last 168 hours.  BNP (last 3 results) No results for input(s): BNP in the last 8760 hours.  ProBNP (last 3  results) No results for input(s): PROBNP in the last 8760 hours.  Radiological Exams: No results found.  Assessment/Plan Active Problems:   Acute on chronic respiratory failure with hypoxia (HCC)   Non-STEMI (non-ST elevated myocardial infarction) (HCC)   Occlusion of superior mesenteric artery (HCC)   Acute renal failure ([place])   Pulmonary embolism and infarction ([place])   Acute metabolic encephalopathy   Cardiac arrest ([place])   1. Acute on chronic respiratory failure with hypoxia at this time patient is going to continue with the T collar.  He is not able to Right now.  We will continue secretion management pulmonary toilet prognosis guarded. 2. Non-STEMI stable 3. Occlusion of superior mesenteric artery status post resection 4. Acute renal failure clinically improved 5. Pulmonary embolism treated we will monitor 6. Acute metabolic encephalopathy clinically resolving 7. Cardiac arrest stable rhythm   I have personally seen and evaluated the patient, evaluated laboratory and imaging results, formulated the assessment and plan and placed orders. The Patient requires high complexity decision making for assessment and support.  Case was discussed on Rounds with the Respiratory Therapy Staff  Allyne Gee, MD [facility] Pulmonary Critical Care Medicine Sleep Medicine

## 2017-10-24 ENCOUNTER — Other Ambulatory Visit (HOSPITAL_COMMUNITY): Payer: Self-pay

## 2017-10-24 LAB — BASIC METABOLIC PANEL
Anion gap: 5 (ref 5–15)
BUN: 21 mg/dL (ref 8–23)
CO2: 26 mmol/L (ref 22–32)
Calcium: 7.9 mg/dL — ABNORMAL LOW (ref 8.9–10.3)
Chloride: 104 mmol/L (ref 98–111)
Creatinine, Ser: 1.15 mg/dL (ref 0.61–1.24)
GFR calc Af Amer: >60 mL/min (ref >60)
GFR calc non Af Amer: >60 mL/min (ref >60)
GLUCOSE: 118 mg/dL — AB (ref 70–99)
POTASSIUM: 4.3 mmol/L (ref 3.5–5.1)
Sodium: 135 mmol/L (ref 135–145)

## 2017-10-24 LAB — CBC
HEMATOCRIT: 20 % — AB (ref 39.0–52.0)
HEMOGLOBIN: 6.1 g/dL — AB (ref 13.0–17.0)
MCH: 27.7 pg (ref 26.0–34.0)
MCHC: 30.5 g/dL (ref 30.0–36.0)
MCV: 90.9 fL (ref 78.0–100.0)
Platelets: 323 10*3/uL (ref 150–400)
RBC: 2.2 MIL/uL — AB (ref 4.22–5.81)
RDW: 15.4 % (ref 11.5–15.5)
WBC: 9.6 10*3/uL (ref 4.0–10.5)

## 2017-10-24 LAB — PREPARE RBC (CROSSMATCH)

## 2017-10-25 ENCOUNTER — Other Ambulatory Visit (HOSPITAL_COMMUNITY): Payer: Self-pay

## 2017-10-25 LAB — CBC
HCT: 22.9 % — ABNORMAL LOW (ref 39.0–52.0)
Hemoglobin: 7 g/dL — ABNORMAL LOW (ref 13.0–17.0)
MCH: 28.3 pg (ref 26.0–34.0)
MCHC: 30.6 g/dL (ref 30.0–36.0)
MCV: 92.7 fL (ref 78.0–100.0)
PLATELETS: 301 10*3/uL (ref 150–400)
RBC: 2.47 MIL/uL — ABNORMAL LOW (ref 4.22–5.81)
RDW: 15.5 % (ref 11.5–15.5)
WBC: 10 10*3/uL (ref 4.0–10.5)

## 2017-10-25 LAB — OCCULT BLOOD X 1 CARD TO LAB, STOOL: Fecal Occult Bld: POSITIVE — AB

## 2017-10-25 LAB — APTT: APTT: 46 s — AB (ref 24–36)

## 2017-10-25 MED ORDER — IOHEXOL 300 MG/ML  SOLN: 100.0000 mL | Freq: Once | INTRAMUSCULAR | Status: DC | PRN

## 2017-10-26 ENCOUNTER — Other Ambulatory Visit (HOSPITAL_COMMUNITY): Payer: Self-pay

## 2017-10-26 DIAGNOSIS — K869 Disease of pancreas, unspecified: Secondary | ICD-10-CM | POA: Diagnosis not present

## 2017-10-26 DIAGNOSIS — M7989 Other specified soft tissue disorders: Secondary | ICD-10-CM | POA: Diagnosis not present

## 2017-10-26 LAB — BPAM PLATELET PHERESIS
BLOOD PRODUCT EXPIRATION DATE: 201907072359
Blood Product Expiration Date: 201907072359
UNIT TYPE AND RH: 9500
Unit Type and Rh: 5100

## 2017-10-26 LAB — CBC
HCT: 21.5 % — ABNORMAL LOW (ref 39.0–52.0)
HEMATOCRIT: 21.6 % — AB (ref 39.0–52.0)
HEMOGLOBIN: 6.5 g/dL — AB (ref 13.0–17.0)
Hemoglobin: 6.8 g/dL — CL (ref 13.0–17.0)
MCH: 28 pg (ref 26.0–34.0)
MCH: 28.5 pg (ref 26.0–34.0)
MCHC: 30.2 g/dL (ref 30.0–36.0)
MCHC: 31.5 g/dL (ref 30.0–36.0)
MCV: 92.7 fL (ref 78.0–100.0)
MCV: 90.4 fL (ref 78.0–100.0)
Platelets: 355 10*3/uL (ref 150–400)
Platelets: 322 10*3/uL (ref 150–400)
RBC: 2.32 MIL/uL — AB (ref 4.22–5.81)
RBC: 2.39 MIL/uL — ABNORMAL LOW (ref 4.22–5.81)
RDW: 16.3 % — ABNORMAL HIGH (ref 11.5–15.5)
RDW: 16.1 % — AB (ref 11.5–15.5)
WBC: 9 10*3/uL (ref 4.0–10.5)
WBC: 7.9 10*3/uL (ref 4.0–10.5)

## 2017-10-26 LAB — PREPARE PLATELET PHERESIS
UNIT DIVISION: 0
Unit division: 0

## 2017-10-26 LAB — PREPARE RBC (CROSSMATCH)

## 2017-10-26 NOTE — Progress Notes (Signed)
Contrast extravasation of 22g left wrist PIV during injection phase of CT abdomen and pelvis.  Approximate volume of hand flushed saline 43mL and 73mL power injected test, 2mL contrast.  For 2-3 days apply ice, elevation observe for changes in skin appearance and circulation.  Contact attending physician to report adverse changes.  Two ice packs and elevation applied prior to patient's departure from CT1.  Radiologist Dr. Gerilyn Nestle present to inspect area at time of incident.  Although patient's extremity somewhat edematous at start of procedure, area just superior to PIV became hardened.  Topogram shows contrast collection, and it appears to have diffusely spead superiorly throughout forearm, there is no area of specific accumulated swelling.  This was 2nd attempt to perform scan as earlier PIV was not able to be satisfactorily injected.  Contrast extravasation orders placed.

## 2017-10-26 NOTE — Progress Notes (Signed)
This patient has received 85 ml's of IV Omnipaque 300 contrast extravasation into the left wrist/forearm during a CT abdomen/pelvis exam.  The exam was performed on (date) 10/26/17  Site / affected area assessed by Dr. Lucienne Capers  IV contrast extravasation: I was called to the CT suite for evaluation of the patient after extravasation of intravenous contrast material.  Imaging obtained demonstrated no visible intravascular contrast material.  The scout view showed soft tissue contrast material in the left forearm.  About 85 mL Omnipaque 300 was extravasated.   On examination, the patient was resting comfortably and in no acute distress.  An IV catheter was present in the left wrist.  On palpation, the forearm was moderately tight feeling without localized swelling.  There was pitting edema over the left forearm and left hand and wrist.  The left hand and nail beds were pale and no definitive capillary reflex was demonstrated.  Due to pre-existing condition, the patient is unable to feel or move the left arm and there was no grip strength.  According to the technologist, the hand and arm were puffy and swollen prior to the examination.  There is no red discoloration, ulceration, or blistering.  The patient reported no pain or discomfort, however, is unable to feel the left arm.   Assessment: Moderate volume deep extravasation of nonionic intravenous contrast material into the left forearm.  Due to the location of the IV in the left wrist, pre-existing swelling in the left forearm and wrist, and pre-existing condition resulting in loss of sensation to the left arm, this patient is at high risk for complication from contrast extravasation and close observation is warranted.   Recommend conservative measures with elevation of the left arm, ice packs t.i.d. for 20 minutes, and regular observation and monitoring for any evidence of complication.  Notify physician and consider plastic surgery consultation  if the patient develops any increased swelling, blistering, change in coloration, or skin necrosis.

## 2017-10-27 ENCOUNTER — Other Ambulatory Visit (HOSPITAL_COMMUNITY): Payer: Self-pay

## 2017-10-27 ENCOUNTER — Ambulatory Visit (HOSPITAL_COMMUNITY): Payer: PPO

## 2017-10-27 LAB — PREPARE FRESH FROZEN PLASMA
UNIT DIVISION: 0
Unit division: 0

## 2017-10-27 LAB — BPAM RBC
BLOOD PRODUCT EXPIRATION DATE: 201908062359
Blood Product Expiration Date: 201908102359
Blood Product Expiration Date: 201908102359
ISSUE DATE / TIME: 201907071223
ISSUE DATE / TIME: 201907051009
ISSUE DATE / TIME: 201907072338
Unit Type and Rh: 5100
Unit Type and Rh: 5100
Unit Type and Rh: 5100

## 2017-10-27 LAB — TYPE AND SCREEN
ABO/RH(D): O POS
ANTIBODY SCREEN: NEGATIVE
UNIT DIVISION: 0
Unit division: 0
Unit division: 0

## 2017-10-27 LAB — BPAM FFP
BLOOD PRODUCT EXPIRATION DATE: 201907102359
Blood Product Expiration Date: 201907102359
ISSUE DATE / TIME: 201907071814
ISSUE DATE / TIME: 201907071638
Unit Type and Rh: 6200
Unit Type and Rh: 6200

## 2017-10-27 LAB — BASIC METABOLIC PANEL
ANION GAP: 7 (ref 5–15)
BUN: 20 mg/dL (ref 8–23)
CALCIUM: 8.3 mg/dL — AB (ref 8.9–10.3)
CHLORIDE: 103 mmol/L (ref 98–111)
CO2: 26 mmol/L (ref 22–32)
Creatinine, Ser: 0.98 mg/dL (ref 0.61–1.24)
GFR calc non Af Amer: >60 mL/min (ref >60)
Glucose, Bld: 112 mg/dL — ABNORMAL HIGH (ref 70–99)
POTASSIUM: 4.3 mmol/L (ref 3.5–5.1)
Sodium: 136 mmol/L (ref 135–145)

## 2017-10-27 LAB — CBC
HEMATOCRIT: 26.3 % — AB (ref 39.0–52.0)
HEMOGLOBIN: 8.3 g/dL — AB (ref 13.0–17.0)
MCH: 28.1 pg (ref 26.0–34.0)
MCHC: 31.6 g/dL (ref 30.0–36.0)
MCV: 89.2 fL (ref 78.0–100.0)
Platelets: 335 10*3/uL (ref 150–400)
RBC: 2.95 MIL/uL — AB (ref 4.22–5.81)
RDW: 17.3 % — ABNORMAL HIGH (ref 11.5–15.5)
WBC: 8.5 10*3/uL (ref 4.0–10.5)

## 2017-10-27 NOTE — Progress Notes (Signed)
PA to bedside for follow-up of contrast extravasation overnight.  Patient had 85 mL contrast infiltrate L wrist.  Patient with limited ROM and severe swelling of wrist prior to scan which he states is unchanged today. He cannot move his fingers which he states he has not be able to do in 3 weeks.  Diffuse edema noted without evidence of skin breakdown. Arm currently elevated to level of the heart. Continue current management.   Brynda Greathouse, MS RD PA-C 12:17 PM

## 2017-10-27 NOTE — Progress Notes (Signed)
[name]   PULMONARY SERVICE  PROGRESS NOTE  Date of Service: 10/27/2017  Luke Kaufman  FOY:[id number]  DOB: Sep 27, 1950   DOA: 10/09/2017  Referring Physician: Merton Border, MD  HPI: Luke Kaufman is a 67 y.o. male seen for follow up of Acute on Chronic Respiratory Failure.  Patient is on T collar has been having issues with GI bleed.  Has been requiring frequent transfusions.  Medications: Reviewed on Rounds  Physical Exam:  Vitals: Temperature 98.0 pulse 83 respiratory rate 18 blood pressure 144/62 saturations 100%  Ventilator Settings currently on T collar  . General: Comfortable at this time . Eyes: Grossly normal lids, irises & conjunctiva . ENT: grossly tongue is normal . Neck: no obvious mass . Cardiovascular: S1 S2 normal no gallop . Respiratory: Coarse breath sounds no rhonchi . Abdomen: soft . Skin: no rash seen on limited exam . Musculoskeletal: not rigid . Psychiatric:unable to assess . Neurologic: no seizure no involuntary movements         Lab Data:   Basic Metabolic Panel: Recent Labs  Lab 10/24/17 0545 10/27/17 0536  NA 135 136  K 4.3 4.3  CL 104 103  CO2 26 26  GLUCOSE 118* 112*  BUN 21 20  CREATININE 1.15 0.98  CALCIUM 7.9* 8.3*    Liver Function Tests: No results for input(s): AST, ALT, ALKPHOS, BILITOT, PROT, ALBUMIN in the last 168 hours. No results for input(s): LIPASE, AMYLASE in the last 168 hours. No results for input(s): AMMONIA in the last 168 hours.  CBC: Recent Labs  Lab 10/24/17 0545 10/25/17 0551 10/26/17 0849 10/26/17 2031 10/27/17 0536  WBC 9.6 10.0 9.0 7.9 8.5  HGB 6.1* 7.0* 6.5* 6.8* 8.3*  HCT 20.0* 22.9* 21.5* 21.6* 26.3*  MCV 90.9 92.7 92.7 90.4 89.2  PLT 323 301 355 322 335    Cardiac Enzymes: No results for input(s): CKTOTAL, CKMB, CKMBINDEX, TROPONINI in the last 168 hours.  BNP (last 3 results) No results for input(s): BNP in the last 8760  hours.  ProBNP (last 3 results) No results for input(s): PROBNP in the last 8760 hours.  Radiological Exams: Ct Abdomen Pelvis Wo Contrast  Result Date: 10/26/2017 CLINICAL DATA:  Gastrointestinal bleeding. The examination was ordered with IV contrast material. Attempted contrast injection was complicated by IV contrast extravasation into the left forearm. No significant intravenous contrast material is identified and therefore the study is functionally a noncontrast study. EXAM: CT ABDOMEN AND PELVIS WITHOUT CONTRAST TECHNIQUE: Multidetector CT imaging of the abdomen and pelvis was performed following the standard protocol without IV contrast. COMPARISON:  09/03/2017 FINDINGS: IV contrast extravasation: I was called to the CT suite for evaluation of the patient after extravasation of intravenous contrast material. Imaging obtained demonstrated no visible intravascular contrast material. The scout view showed soft tissue contrast material in the left forearm. About 85 mL Omnipaque 300 was extravasated. On examination, the patient was resting comfortably and in no acute distress. An IV catheter was present in the left wrist. On palpation, the forearm was moderately tight feeling without localized swelling. There was pitting edema over the left forearm and left hand and wrist. The left hand and nail beds were pale and no definitive capillary reflex was demonstrated. Due to pre-existing condition, the patient is unable to feel or move the left arm and there was no grip strength. According to the technologist, the hand and arm were puffy and swollen prior to the examination. There is no red  discoloration, ulceration, or blistering. The patient reported no pain or discomfort, however, is unable to feel the left arm. Assessment: Moderate volume deep extravasation of nonionic intravenous contrast material into the left forearm. Due to the location of the IV in the left wrist, pre-existing swelling in the left  forearm and wrist, and pre-existing condition resulting in loss of sensation to the left arm, this patient is at high risk for complication from contrast extravasation and close observation is warranted. Recommend conservative measures with elevation of the left arm, ice packs t.i.d. for 20 minutes, and regular observation and monitoring for any evidence of complication. Notify physician and consider plastic surgery consultation if the patient develops any increased swelling, blistering, change in coloration, or skin necrosis. These results were called by telephone at the time of interpretation on 10/26/2017 at 1:59 am to RN Vonna Kotyk, who verbally acknowledged these results, and indicated that the covering physician has been notified. Lower chest: Small bilateral pleural effusions with basilar atelectasis. Coronary artery calcifications. Enteric tube with tip in the body of the stomach. Hepatobiliary: No focal liver abnormality is seen. No gallstones, gallbladder wall thickening, or biliary dilatation. Pancreas: Diffuse fatty infiltration of the pancreas. No acute inflammatory changes identified. Spleen: Normal in size without focal abnormality. Adrenals/Urinary Tract: No adrenal gland nodules. Severe atrophy of the left kidney suggesting chronic renal vascular disease. No hydronephrosis or hydroureter in either kidney. Bladder wall is not thickened and no bladder stones are demonstrated. Stomach/Bowel: Stomach, small bowel, and colon are mostly decompressed. There is residual contrast material in the colon. No wall thickening is appreciated although under distention significantly limits evaluation of the bowel wall. 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 Vascular/Lymphatic: Aortic atherosclerosis. No enlarged abdominal or pelvic lymph nodes. Reproductive: Prostate gland is enlarged, measuring 4.5 cm in diameter. Other: There is laxity in the anterior abdominal wall with postoperative  changes consistent with mesh hernia repair. This is new since previous study. Scarring along the anterior abdominal wall consistent with postoperative change. Mild mesenteric and soft tissue edema suggesting anasarca. No free air or free fluid in the abdomen. New development of increased density around the left low pelvis and acetabular region. 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 Hematoma could also have this appearance. Musculoskeletal: No fracture is seen. Degenerative changes in the spine. IMPRESSION: 1. Examination was complicated with extravasation of 85 mL Isovue-300 into the soft tissues of the left forearm. 2. Small bilateral pleural effusions with basilar atelectasis. Mild edema in the mesentery and subcutaneous fat consistent with anasarca. 3. Diffuse fatty infiltration of the pancreas. 4. Severe left renal atrophy suggests chronic renal vascular disease. 5. No bowel obstruction or inflammation. 6. Aortic atherosclerosis. 7. Enlarged prostate gland. 8. Postoperative changes in the anterior abdominal wall. 9. Increased density developing in the left acetabular region probably representing developing heterotopic ossification. Hematoma would also possibly have this appearance. No fractures identified. Electronically Signed   By: Lucienne Capers M.D.   On: 10/26/2017 02:09   [place] Lt Upper Extrem Ltd Soft Tissue Non Vascular  Result Date: 10/27/2017 CLINICAL DATA:  Focal soft tissue swelling of the forearm after IV infiltration, query hematoma EXAM: ULTRASOUND left UPPER EXTREMITY LIMITED TECHNIQUE: Ultrasound examination of the upper extremity soft tissues was performed in the area of clinical concern. COMPARISON:  Non FINDINGS: No focal mass like opacity is seen in the subcutaneous tissues or adjacent musculature of the left forearm. A small portion of the left lateral hand was also included. There is potentially  some fluid infiltration along the subcutaneous tissues. IMPRESSION: 1.  No masslike appearance characteristic of hematoma identified. Possible fluid infiltration along the subcutaneous tissues. Depending on the contents of the IV fluid which presumably extravasated, careful surveillance of the swelling and overlying skin may be warranted. Electronically Signed   By: Van Clines M.D.   On: 10/27/2017 06:44    Assessment/Plan Active Problems:   Acute on chronic respiratory failure with hypoxia (HCC)   Non-STEMI (non-ST elevated myocardial infarction) ([place])   Occlusion of superior mesenteric artery (HCC)   Acute renal failure ([place])   Pulmonary embolism and infarction ([place])   Acute metabolic encephalopathy   Cardiac arrest ([place])   1. Acute on chronic respiratory failure with hypoxia we will continue with T collar continue secretion management pulmonary toilet. 2. GI bleed requiring frequent transfusions we will discuss with the primary care team 3. Non-STEMI at baseline stable 4. Acute renal failure labs being followed 5. Metabolic encephalopathy remains at baseline 6. Cardiac arrest no arrhythmias noted   I have personally seen and evaluated the patient, evaluated laboratory and imaging results, formulated the assessment and plan and placed orders. The Patient requires high complexity decision making for assessment and support.  Case was discussed on Rounds with the Respiratory Therapy Staff  Allyne Gee, MD [facility] Pulmonary Critical Care Medicine Sleep Medicine

## 2017-10-28 ENCOUNTER — Other Ambulatory Visit (HOSPITAL_COMMUNITY): Payer: Self-pay

## 2017-10-28 LAB — HEMOGLOBIN AND HEMATOCRIT, BLOOD
HCT: 27.1 % — ABNORMAL LOW (ref 39.0–52.0)
HEMOGLOBIN: 8.5 g/dL — AB (ref 13.0–17.0)

## 2017-10-28 NOTE — Progress Notes (Signed)
Pulmonary [place]   PULMONARY SERVICE  PROGRESS NOTE  Date of Service: 10/28/2017  Luke Kaufman  NTZ:[id number]  DOB: 11-17-1950   DOA: 10/09/2017  Referring Physician: Merton Border, MD  HPI: Luke Kaufman is a 67 y.o. male seen for follow up of Acute on Chronic Respiratory Failure.  Currently patient's on T collar secretions have been copious.  Remains on 28% FiO2 at this time.  Medications: Reviewed on Rounds  Physical Exam:  Vitals: Temperature 97.6 pulse 76 respiratory rate 25 blood pressure 157/72 saturations 100%  Ventilator Settings currently on T collar trials  . General: Comfortable at this time . Eyes: Grossly normal lids, irises & conjunctiva . ENT: grossly tongue is normal . Neck: no obvious mass . Cardiovascular: S1 S2 normal no gallop . Respiratory: Coarse breath sounds noted bilaterally . Abdomen: soft . Skin: no rash seen on limited exam . Musculoskeletal: not rigid . Psychiatric:unable to assess . Neurologic: no seizure no involuntary movements         Lab Data:   Basic Metabolic Panel: Recent Labs  Lab 10/24/17 0545 10/27/17 0536  NA 135 136  K 4.3 4.3  CL 104 103  CO2 26 26  GLUCOSE 118* 112*  BUN 21 20  CREATININE 1.15 0.98  CALCIUM 7.9* 8.3*    Liver Function Tests: No results for input(s): AST, ALT, ALKPHOS, BILITOT, PROT, ALBUMIN in the last 168 hours. No results for input(s): LIPASE, AMYLASE in the last 168 hours. No results for input(s): AMMONIA in the last 168 hours.  CBC: Recent Labs  Lab 10/24/17 0545 10/25/17 0551 10/26/17 0849 10/26/17 2031 10/27/17 0536 10/28/17 0700  WBC 9.6 10.0 9.0 7.9 8.5  --   HGB 6.1* 7.0* 6.5* 6.8* 8.3* 8.5*  HCT 20.0* 22.9* 21.5* 21.6* 26.3* 27.1*  MCV 90.9 92.7 92.7 90.4 89.2  --   PLT 323 301 355 322 335  --     Cardiac Enzymes: No results for input(s): CKTOTAL, CKMB, CKMBINDEX, TROPONINI in the last 168 hours.  BNP (last 3 results) No  results for input(s): BNP in the last 8760 hours.  ProBNP (last 3 results) No results for input(s): PROBNP in the last 8760 hours.  Radiological Exams: [place] Soft Tissue Non Vascular  Result Date: 10/27/2017 CLINICAL DATA:  Focal soft tissue swelling of the forearm after IV infiltration, query hematoma EXAM: ULTRASOUND left UPPER EXTREMITY LIMITED TECHNIQUE: Ultrasound examination of the upper extremity soft tissues was performed in the area of clinical concern. COMPARISON:  Non FINDINGS: No focal mass like opacity is seen in the subcutaneous tissues or adjacent musculature of the left forearm. A small portion of the left lateral hand was also included. There is potentially some fluid infiltration along the subcutaneous tissues. IMPRESSION: 1. No masslike appearance characteristic of hematoma identified. Possible fluid infiltration along the subcutaneous tissues. Depending on the contents of the IV fluid which presumably extravasated, careful surveillance of the swelling and overlying skin may be warranted. Electronically Signed   By: Van Clines M.D.   On: 10/27/2017 06:44    Assessment/Plan Active Problems:   Acute on chronic respiratory failure with hypoxia (HCC)   Non-STEMI (non-ST elevated myocardial infarction) ([place])   Occlusion of superior mesenteric artery (HCC)   Acute renal failure ([place])   Pulmonary embolism and infarction ([place])   Acute metabolic encephalopathy   Cardiac arrest ([place])   1. Acute on chronic respiratory failure with hypoxia we will continue with T  collar weaning as ordered.  Continue aggressive pulmonary toilet.  Patient has had ongoing issues with GI bleed is being worked up. 2. Superior mesenteric artery occlusion status post resection 3. Non-STEMI stable 4. Acute renal failure labs are stable we will monitor 5. Metabolic encephalopathy at baseline 6. Status post cardiac arrest unchanged rhythm stable   I have personally seen and  evaluated the patient, evaluated laboratory and imaging results, formulated the assessment and plan and placed orders. The Patient requires high complexity decision making for assessment and support.  Case was discussed on Rounds with the Respiratory Therapy Staff  Allyne Gee, MD [facility] Pulmonary Critical Care Medicine Sleep Medicine

## 2017-10-29 ENCOUNTER — Other Ambulatory Visit (HOSPITAL_COMMUNITY): Payer: Self-pay

## 2017-10-29 DIAGNOSIS — J189 Pneumonia, unspecified organism: Secondary | ICD-10-CM | POA: Diagnosis not present

## 2017-10-29 NOTE — Progress Notes (Signed)
Pulmonary [place]   PULMONARY SERVICE  PROGRESS NOTE  Date of Service: 10/29/2017  Luke Kaufman  ZYS:[id number]  DOB: Oct 12, 1950   DOA: 10/09/2017  Referring Physician: Merton Border, MD  HPI: Luke Kaufman is a 67 y.o. male seen for follow up of Acute on Chronic Respiratory Failure.  Remains on T collar right now is tolerating PMV without distress.  Patient was attempted at capping and failed  Medications: Reviewed on Rounds  Physical Exam:  Vitals: Temperature 98.0 pulse 70 respiratory rate 13 blood pressure 94/76 saturations 100%  Ventilator Settings off the ventilator on T collar with PMV in place  . General: Comfortable at this time . Eyes: Grossly normal lids, irises & conjunctiva . ENT: grossly tongue is normal . Neck: no obvious mass . Cardiovascular: S1 S2 normal no gallop . Respiratory: Coarse breath sounds bilaterally with increased secretions . Abdomen: soft . Skin: no rash seen on limited exam . Musculoskeletal: not rigid . Psychiatric:unable to assess . Neurologic: no seizure no involuntary movements         Lab Data:   Basic Metabolic Panel: Recent Labs  Lab 10/24/17 0545 10/27/17 0536  NA 135 136  K 4.3 4.3  CL 104 103  CO2 26 26  GLUCOSE 118* 112*  BUN 21 20  CREATININE 1.15 0.98  CALCIUM 7.9* 8.3*    Liver Function Tests: No results for input(s): AST, ALT, ALKPHOS, BILITOT, PROT, ALBUMIN in the last 168 hours. No results for input(s): LIPASE, AMYLASE in the last 168 hours. No results for input(s): AMMONIA in the last 168 hours.  CBC: Recent Labs  Lab 10/24/17 0545 10/25/17 0551 10/26/17 0849 10/26/17 2031 10/27/17 0536 10/28/17 0700  WBC 9.6 10.0 9.0 7.9 8.5  --   HGB 6.1* 7.0* 6.5* 6.8* 8.3* 8.5*  HCT 20.0* 22.9* 21.5* 21.6* 26.3* 27.1*  MCV 90.9 92.7 92.7 90.4 89.2  --   PLT 323 301 355 322 335  --     Cardiac Enzymes: No results for input(s): CKTOTAL, CKMB, CKMBINDEX,  TROPONINI in the last 168 hours.  BNP (last 3 results) No results for input(s): BNP in the last 8760 hours.  ProBNP (last 3 results) No results for input(s): PROBNP in the last 8760 hours.  Radiological Exams: Dg Chest Port 1 View  Result Date: 10/29/2017 CLINICAL DATA:  Pneumonia.  Tracheostomy. EXAM: PORTABLE CHEST 1 VIEW COMPARISON:  10/18/2017 FINDINGS: Tracheostomy tube and nasogastric tube remain in appropriate position. Decreased opacity is seen in both lung bases. Low lung volumes are again demonstrated, but no new or worsening areas of pulmonary opacity are seen. Heart size is within normal limits. IMPRESSION: Improving bibasilar pulmonary opacity. Electronically Signed   By: Earle Gell M.D.   On: 10/29/2017 08:49    Assessment/Plan Active Problems:   Acute on chronic respiratory failure with hypoxia (HCC)   Non-STEMI (non-ST elevated myocardial infarction) ([place])   Occlusion of superior mesenteric artery (HCC)   Acute renal failure ([place])   Pulmonary embolism and infarction ([place])   Acute metabolic encephalopathy   Cardiac arrest ([place])   1. Acute on chronic respiratory failure with hypoxia we will continue with T collar as ordered.  Continue pulmonary toilet supportive care patient's been tolerating PMV as noted above.  Patient has not been tolerating capping. 2. Non-STEMI stable we will monitor 3. Occlusion of superior mesenteric artery status post resection 4. Acute renal failure resolved we will monitor labs 5. Pulmonary embolism treated 6. Acute encephalopathy  grossly unchanged 7. Status post cardiac arrest rhythm has been stable at this time   I have personally seen and evaluated the patient, evaluated laboratory and imaging results, formulated the assessment and plan and placed orders. The Patient requires high complexity decision making for assessment and support.  Case was discussed on Rounds with the Respiratory Therapy Staff  Allyne Gee, MD [facility] Pulmonary  Critical Care Medicine Sleep Medicine

## 2017-10-30 LAB — BASIC METABOLIC PANEL
ANION GAP: 11 (ref 5–15)
BUN: 18 mg/dL (ref 8–23)
CHLORIDE: 99 mmol/L (ref 98–111)
CO2: 24 mmol/L (ref 22–32)
Calcium: 8.4 mg/dL — ABNORMAL LOW (ref 8.9–10.3)
Creatinine, Ser: 0.96 mg/dL (ref 0.61–1.24)
GFR calc Af Amer: >60 mL/min (ref >60)
GFR calc non Af Amer: >60 mL/min (ref >60)
GLUCOSE: 107 mg/dL — AB (ref 70–99)
POTASSIUM: 4.3 mmol/L (ref 3.5–5.1)
Sodium: 134 mmol/L — ABNORMAL LOW (ref 135–145)

## 2017-10-30 LAB — CBC
HCT: 27.8 % — ABNORMAL LOW (ref 39.0–52.0)
Hemoglobin: 8.8 g/dL — ABNORMAL LOW (ref 13.0–17.0)
MCH: 28.3 pg (ref 26.0–34.0)
MCHC: 31.7 g/dL (ref 30.0–36.0)
MCV: 89.4 fL (ref 78.0–100.0)
Platelets: 357 10*3/uL (ref 150–400)
RBC: 3.11 MIL/uL — ABNORMAL LOW (ref 4.22–5.81)
RDW: 17.3 % — ABNORMAL HIGH (ref 11.5–15.5)
WBC: 8.9 10*3/uL (ref 4.0–10.5)

## 2017-10-30 NOTE — Progress Notes (Signed)
Pulmonary [place]   PULMONARY SERVICE  PROGRESS NOTE  Date of Service: 10/30/2017  Luke Kaufman  [id number]:[id number]  DOB: October 30, 1950   DOA: 10/09/2017  Referring Physician: Merton Border, MD  HPI: Luke Kaufman is a 67 y.o. male seen for follow up of Acute on Chronic Respiratory Failure.  He remains on T collar.  Patient has been tolerating the the PMV in doing well.  Secretions are fair to moderate.  Medications: Reviewed on Rounds  Physical Exam:  Vitals:   Temperature 97.8 degrees pulse 85 respiratory 15 blood pressure 143/54 saturations 99%  Ventilator Settings  Currently is on T collar has been on 28% FiO2  . General: Comfortable at this time . Eyes: Grossly normal lids, irises & conjunctiva . ENT: grossly tongue is normal . Neck: no obvious mass . Cardiovascular: S1 S2 normal no gallop . Respiratory:  No rhonchi or rales are noted at this time. . Abdomen: soft . Skin: no rash seen on limited exam . Musculoskeletal: not rigid . Psychiatric:unable to assess . Neurologic: no seizure no involuntary movements         Lab Data:   Basic Metabolic Panel: Recent Labs  Lab 10/24/17 0545 10/27/17 0536 10/30/17 0649  NA 135 136 134*  K 4.3 4.3 4.3  CL 104 103 99  CO2 26 26 24   GLUCOSE 118* 112* 107*  BUN 21 20 18   CREATININE 1.15 0.98 0.96  CALCIUM 7.9* 8.3* 8.4*    Liver Function Tests: No results for input(s): AST, ALT, ALKPHOS, BILITOT, PROT, ALBUMIN in the last 168 hours. No results for input(s): LIPASE, AMYLASE in the last 168 hours. No results for input(s): AMMONIA in the last 168 hours.  CBC: Recent Labs  Lab 10/25/17 0551 10/26/17 0849 10/26/17 2031 10/27/17 0536 10/28/17 0700 10/30/17 0649  WBC 10.0 9.0 7.9 8.5  --  8.9  HGB 7.0* 6.5* 6.8* 8.3* 8.5* 8.8*  HCT 22.9* 21.5* 21.6* 26.3* 27.1* 27.8*  MCV 92.7 92.7 90.4 89.2  --  89.4  PLT 301 355 322 335  --  357    Cardiac Enzymes: No results for  input(s): CKTOTAL, CKMB, CKMBINDEX, TROPONINI in the last 168 hours.  BNP (last 3 results) No results for input(s): BNP in the last 8760 hours.  ProBNP (last 3 results) No results for input(s): PROBNP in the last 8760 hours.  Radiological Exams: Dg Chest Port 1 View  Result Date: 10/29/2017 CLINICAL DATA:  Pneumonia.  Tracheostomy. EXAM: PORTABLE CHEST 1 VIEW COMPARISON:  10/18/2017 FINDINGS: Tracheostomy tube and nasogastric tube remain in appropriate position. Decreased opacity is seen in both lung bases. Low lung volumes are again demonstrated, but no new or worsening areas of pulmonary opacity are seen. Heart size is within normal limits. IMPRESSION: Improving bibasilar pulmonary opacity. Electronically Signed   By: Earle Gell M.D.   On: 10/29/2017 08:49    Assessment/Plan Active Problems:   Acute on chronic respiratory failure with hypoxia (HCC)   Non-STEMI (non-ST elevated myocardial infarction) ([place])   Occlusion of superior mesenteric artery (HCC)   Acute renal failure ([place])   Pulmonary embolism and infarction ([place])   Acute metabolic encephalopathy   Cardiac arrest ([place])   1.   Acute on chronic Respiratory failure with hypoxia will continue with T collar continue pulmonary toilet supportive care patient's PMV is being tolerated fairly well not tolerating capping however.   2. Non STEMI at baseline 3. Occlusion of superior mesenteric artery status post resection  4. Acute renal failure renal function is better 5. Metabolic encephalopathy stabilized  6. Pulmonary embolism treated will monitor   I have personally seen and evaluated the patient, evaluated laboratory and imaging results, formulated the assessment and plan and placed orders. The Patient requires high complexity decision making for assessment and support.  Case was discussed on Rounds with the Respiratory Therapy Staff  Allyne Gee, MD [facility] Pulmonary Critical Care Medicine Sleep Medicine

## 2017-10-31 ENCOUNTER — Other Ambulatory Visit (HOSPITAL_COMMUNITY): Payer: Self-pay

## 2017-10-31 NOTE — Progress Notes (Signed)
Pulmonary [place]   PULMONARY SERVICE  PROGRESS NOTE  Date of Service: 10/31/2017  Cali Cuartas  RKY:[id number]  DOB: February 16, 1951   DOA: 10/09/2017  Referring Physician: Merton Border, MD  HPI: Saben Donigan is a 67 y.o. male seen for follow up of Acute on Chronic Respiratory Failure.  Patient may now is on T collar has been on 20% FiO2.  Had a swallowing study done which patient was able to pass.  Medications: Reviewed on Rounds  Physical Exam:  Vitals: Temperature 97.1 pulse 80 respiratory rate 13 blood pressure 90/46 saturations 99%  Ventilator Settings off of the ventilator  . General: Comfortable at this time . Eyes: Grossly normal lids, irises & conjunctiva . ENT: grossly tongue is normal . Neck: no obvious mass . Cardiovascular: S1 S2 normal no gallop . Respiratory: Coarse scattered rhonchi . Abdomen: soft . Skin: no rash seen on limited exam . Musculoskeletal: not rigid . Psychiatric:unable to assess . Neurologic: no seizure no involuntary movements         Lab Data:   Basic Metabolic Panel: Recent Labs  Lab 10/27/17 0536 10/30/17 0649  NA 136 134*  K 4.3 4.3  CL 103 99  CO2 26 24  GLUCOSE 112* 107*  BUN 20 18  CREATININE 0.98 0.96  CALCIUM 8.3* 8.4*    Liver Function Tests: No results for input(s): AST, ALT, ALKPHOS, BILITOT, PROT, ALBUMIN in the last 168 hours. No results for input(s): LIPASE, AMYLASE in the last 168 hours. No results for input(s): AMMONIA in the last 168 hours.  CBC: Recent Labs  Lab 10/25/17 0551 10/26/17 0849 10/26/17 2031 10/27/17 0536 10/28/17 0700 10/30/17 0649  WBC 10.0 9.0 7.9 8.5  --  8.9  HGB 7.0* 6.5* 6.8* 8.3* 8.5* 8.8*  HCT 22.9* 21.5* 21.6* 26.3* 27.1* 27.8*  MCV 92.7 92.7 90.4 89.2  --  89.4  PLT 301 355 322 335  --  357    Cardiac Enzymes: No results for input(s): CKTOTAL, CKMB, CKMBINDEX, TROPONINI in the last 168 hours.  BNP (last 3 results) No  results for input(s): BNP in the last 8760 hours.  ProBNP (last 3 results) No results for input(s): PROBNP in the last 8760 hours.  Radiological Exams: No results found.  Assessment/Plan Active Problems:   Acute on chronic respiratory failure with hypoxia (HCC)   Non-STEMI (non-ST elevated myocardial infarction) (HCC)   Occlusion of superior mesenteric artery (HCC)   Acute renal failure ([place])   Pulmonary embolism and infarction ([place])   Acute metabolic encephalopathy   Cardiac arrest ([place])   1. Acute on chronic respiratory failure with hypoxia patient is doing well with the T collar trials which will be continued.  We will continue with pulmonary toilet supportive care. 2. Status post non-STEMI stable no arrhythmias noted 3. Acute renal failure stable we will continue present management 4. Pulmonary embolism infarction treated 5. Metabolic encephalopathy clinically improved 6. Status post cardiac arrest stable no arrhythmias noted   I have personally seen and evaluated the patient, evaluated laboratory and imaging results, formulated the assessment and plan and placed orders. The Patient requires high complexity decision making for assessment and support.  Case was discussed on Rounds with the Respiratory Therapy Staff  Allyne Gee, MD [facility] Pulmonary Critical Care Medicine Sleep Medicine

## 2017-11-02 NOTE — Progress Notes (Signed)
Pulmonary [place]   PULMONARY SERVICE  PROGRESS NOTE  Date of Service: 11/02/2017  Luke Kaufman  [id number]  DOB: 25-Jul-1950   DOA: 10/09/2017  Referring Physician: Merton Border, MD  HPI: Luke Kaufman is a 67 y.o. male seen for follow up of Acute on Chronic Respiratory Failure.  Patient is doing fairly well has been eating tolerating no aspiration noted  Medications: Reviewed on Rounds  Physical Exam:  Vitals: Temperature 97.0 pulse 89 respiratory rate 28 blood pressure 150/54 saturations 100%  Ventilator Settings on her/T collar 28% FiO2  . General: Comfortable at this time . Eyes: Grossly normal lids, irises & conjunctiva . ENT: grossly tongue is normal . Neck: no obvious mass . Cardiovascular: S1 S2 normal no gallop . Respiratory: No rhonchi or rales are noted at this time . Abdomen: soft . Skin: no rash seen on limited exam . Musculoskeletal: not rigid . Psychiatric:unable to assess . Neurologic: no seizure no involuntary movements         Lab Data:   Basic Metabolic Panel: Recent Labs  Lab 10/27/17 0536 10/30/17 0649  NA 136 134*  K 4.3 4.3  CL 103 99  CO2 26 24  GLUCOSE 112* 107*  BUN 20 18  CREATININE 0.98 0.96  CALCIUM 8.3* 8.4*    Liver Function Tests: No results for input(s): AST, ALT, ALKPHOS, BILITOT, PROT, ALBUMIN in the last 168 hours. No results for input(s): LIPASE, AMYLASE in the last 168 hours. No results for input(s): AMMONIA in the last 168 hours.  CBC: Recent Labs  Lab 10/26/17 2031 10/27/17 0536 10/28/17 0700 10/30/17 0649  WBC 7.9 8.5  --  8.9  HGB 6.8* 8.3* 8.5* 8.8*  HCT 21.6* 26.3* 27.1* 27.8*  MCV 90.4 89.2  --  89.4  PLT 322 335  --  357    Cardiac Enzymes: No results for input(s): CKTOTAL, CKMB, CKMBINDEX, TROPONINI in the last 168 hours.  BNP (last 3 results) No results for input(s): BNP in the last 8760 hours.  ProBNP (last 3 results) No results for  input(s): PROBNP in the last 8760 hours.  Radiological Exams: No results found.  Assessment/Plan Active Problems:   Acute on chronic respiratory failure with hypoxia (HCC)   Non-STEMI (non-ST elevated myocardial infarction) (HCC)   Occlusion of superior mesenteric artery (HCC)   Acute renal failure ([place])   Pulmonary embolism and infarction ([place])   Acute metabolic encephalopathy   Cardiac arrest ([place])   1. Acute on chronic respiratory failure with hypoxia continue with T collar patient at baseline secretions are copious.  Titrate oxygen as tolerated continue pulmonary toilet 2. Non-STEMI stable at this time will monitor 3. Acute renal failure labs of been doing fine we will continue to monitor 4. Metabolic encephalopathy improved 5. Cardiac arrest are noted 6. Occlusion of superior mesenteric arteries status post resection   I have personally seen and evaluated the patient, evaluated laboratory and imaging results, formulated the assessment and plan and placed orders. The Patient requires high complexity decision making for assessment and support.  Case was discussed on Rounds with the Respiratory Therapy Staff  Allyne Gee, MD [facility] Pulmonary Critical Care Medicine Sleep Medicine

## 2017-11-03 NOTE — Progress Notes (Signed)
Pulmonary [place]   PULMONARY SERVICE  PROGRESS NOTE  Date of Service: 11/03/2017  Luke Kaufman  [id number]:[id number]  DOB: October 28, 1950   DOA: 10/09/2017  Referring Physician: Merton Border, MD  HPI: Luke Kaufman is a 67 y.o. male seen for follow up of Acute on Chronic Respiratory Failure.  Patient is capping has been doing well yesterday did about 5 hours capping right now is comfortable without distress  Medications: Reviewed on Rounds  Physical Exam:  Vitals: Temperature 96.3 pulse 78 respiratory rate 16 blood pressure 156/77 saturations 100%  Ventilator Settings currently capping  . General: Comfortable at this time . Eyes: Grossly normal lids, irises & conjunctiva . ENT: grossly tongue is normal . Neck: no obvious mass . Cardiovascular: S1 S2 normal no gallop . Respiratory: No rhonchi or rales . Abdomen: soft . Skin: no rash seen on limited exam . Musculoskeletal: not rigid . Psychiatric:unable to assess . Neurologic: no seizure no involuntary movements         Lab Data:   Basic Metabolic Panel: Recent Labs  Lab 10/30/17 0649  NA 134*  K 4.3  CL 99  CO2 24  GLUCOSE 107*  BUN 18  CREATININE 0.96  CALCIUM 8.4*    Liver Function Tests: No results for input(s): AST, ALT, ALKPHOS, BILITOT, PROT, ALBUMIN in the last 168 hours. No results for input(s): LIPASE, AMYLASE in the last 168 hours. No results for input(s): AMMONIA in the last 168 hours.  CBC: Recent Labs  Lab 10/28/17 0700 10/30/17 0649  WBC  --  8.9  HGB 8.5* 8.8*  HCT 27.1* 27.8*  MCV  --  89.4  PLT  --  357    Cardiac Enzymes: No results for input(s): CKTOTAL, CKMB, CKMBINDEX, TROPONINI in the last 168 hours.  BNP (last 3 results) No results for input(s): BNP in the last 8760 hours.  ProBNP (last 3 results) No results for input(s): PROBNP in the last 8760 hours.  Radiological Exams: No results found.  Assessment/Plan Active  Problems:   Acute on chronic respiratory failure with hypoxia (HCC)   Non-STEMI (non-ST elevated myocardial infarction) (HCC)   Occlusion of superior mesenteric artery (HCC)   Acute renal failure ([place])   Pulmonary embolism and infarction ([place])   Acute metabolic encephalopathy   Cardiac arrest ([place])   1. Acute on chronic respiratory failure with hypoxia we will continue with capping trials hopefully will be able to complete 4 hours.  Secretions are improved 2. Non-STEMI stable 3. Acute renal failure clinically resolved we will monitor 4. Pulmonary embolism treated 5. Encephalopathy has cleared up significantly 6. Cardiac arrest rhythm has been stable   I have personally seen and evaluated the patient, evaluated laboratory and imaging results, formulated the assessment and plan and placed orders. The Patient requires high complexity decision making for assessment and support.  Case was discussed on Rounds with the Respiratory Therapy Staff  Allyne Gee, MD [facility] Pulmonary Critical Care Medicine Sleep Medicine

## 2017-11-04 NOTE — Progress Notes (Signed)
Pulmonary [place]   PULMONARY SERVICE  PROGRESS NOTE  Date of Service: 11/04/2017  Luke Kaufman  [id number]  DOB: 1951-03-09   DOA: 10/09/2017  Referring Physician: Merton Border, MD  HPI: Luke Kaufman is a 67 y.o. male seen for follow up of Acute on Chronic Respiratory Failure.  Patient is capping doing very well has been on room air good saturations are noted.  Secretions are minimal.  Medications: Reviewed on Rounds  Physical Exam:  Vitals: Temperature 98.3 pulse 76 respiratory rate 14 blood pressure is 111/65 saturations 100%  Ventilator Settings off the ventilator capping  . General: Comfortable at this time . Eyes: Grossly normal lids, irises & conjunctiva . ENT: grossly tongue is normal . Neck: no obvious mass . Cardiovascular: S1 S2 normal no gallop . Respiratory: No rhonchi or rales . Abdomen: soft . Skin: no rash seen on limited exam . Musculoskeletal: not rigid . Psychiatric:unable to assess . Neurologic: no seizure no involuntary movements         Lab Data:   Basic Metabolic Panel: Recent Labs  Lab 10/30/17 0649  NA 134*  K 4.3  CL 99  CO2 24  GLUCOSE 107*  BUN 18  CREATININE 0.96  CALCIUM 8.4*    Liver Function Tests: No results for input(s): AST, ALT, ALKPHOS, BILITOT, PROT, ALBUMIN in the last 168 hours. No results for input(s): LIPASE, AMYLASE in the last 168 hours. No results for input(s): AMMONIA in the last 168 hours.  CBC: Recent Labs  Lab 10/30/17 0649  WBC 8.9  HGB 8.8*  HCT 27.8*  MCV 89.4  PLT 357    Cardiac Enzymes: No results for input(s): CKTOTAL, CKMB, CKMBINDEX, TROPONINI in the last 168 hours.  BNP (last 3 results) No results for input(s): BNP in the last 8760 hours.  ProBNP (last 3 results) No results for input(s): PROBNP in the last 8760 hours.  Radiological Exams: No results found.  Assessment/Plan Active Problems:   Acute on chronic respiratory  failure with hypoxia (HCC)   Non-STEMI (non-ST elevated myocardial infarction) (HCC)   Occlusion of superior mesenteric artery (HCC)   Acute renal failure ([place])   Pulmonary embolism and infarction ([place])   Acute metabolic encephalopathy   Cardiac arrest ([place])   1. Acute on chronic respiratory failure with hypoxia we will continue with capping trials continue pulmonary toilet supportive care patient is tolerating capping well hopefully should be able to proceed to decannulation 2. Non-STEMI stable no arrhythmias 3. Occlusion of superior mesenteric artery patient is doing fairly well 4. Acute renal failure resolved we will continue to monitor 5. Pulmonary embolism infarction treated 6. Cardiac arrest no arrhythmias 7. Metabolic encephalopathy has improved significantly.   I have personally seen and evaluated the patient, evaluated laboratory and imaging results, formulated the assessment and plan and placed orders. The Patient requires high complexity decision making for assessment and support.  Case was discussed on Rounds with the Respiratory Therapy Staff  Allyne Gee, MD [facility] Pulmonary Critical Care Medicine Sleep Medicine

## 2017-11-05 NOTE — Progress Notes (Signed)
Pulmonary [place]   PULMONARY SERVICE  PROGRESS NOTE  Date of Service: 11/05/2017  Luke Kaufman  VWU:[id number]  DOB: 1951/02/02   DOA: 10/09/2017  Referring Physician: Merton Border, MD  HPI: Luke Kaufman is a 67 y.o. male seen for follow up of Acute on Chronic Respiratory Failure.  He is comfortable without distress continues to do well with capping trials  Medications: Reviewed on Rounds  Physical Exam:  Vitals: Temperature 97.8 pulse 74 respiratory 16 blood pressure 113/50 saturations 98%  Ventilator Settings capping on room air  . General: Comfortable at this time . Eyes: Grossly normal lids, irises & conjunctiva . ENT: grossly tongue is normal . Neck: no obvious mass . Cardiovascular: S1 S2 normal no gallop . Respiratory: No rhonchi or rales . Abdomen: soft . Skin: no rash seen on limited exam . Musculoskeletal: not rigid . Psychiatric:unable to assess . Neurologic: no seizure no involuntary movements         Lab Data:   Basic Metabolic Panel: Recent Labs  Lab 10/30/17 0649  NA 134*  K 4.3  CL 99  CO2 24  GLUCOSE 107*  BUN 18  CREATININE 0.96  CALCIUM 8.4*    Liver Function Tests: No results for input(s): AST, ALT, ALKPHOS, BILITOT, PROT, ALBUMIN in the last 168 hours. No results for input(s): LIPASE, AMYLASE in the last 168 hours. No results for input(s): AMMONIA in the last 168 hours.  CBC: Recent Labs  Lab 10/30/17 0649  WBC 8.9  HGB 8.8*  HCT 27.8*  MCV 89.4  PLT 357    Cardiac Enzymes: No results for input(s): CKTOTAL, CKMB, CKMBINDEX, TROPONINI in the last 168 hours.  BNP (last 3 results) No results for input(s): BNP in the last 8760 hours.  ProBNP (last 3 results) No results for input(s): PROBNP in the last 8760 hours.  Radiological Exams: No results found.  Assessment/Plan Active Problems:   Acute on chronic respiratory failure with hypoxia (HCC)   Non-STEMI (non-ST  elevated myocardial infarction) (HCC)   Occlusion of superior mesenteric artery (HCC)   Acute renal failure ([place])   Pulmonary embolism and infarction ([place])   Acute metabolic encephalopathy   Cardiac arrest ([place])   1. Acute on chronic respiratory failure with hypoxia we will continue with capping now will be at 48 hours hopefully will decannulate soon 2. Non-STEMI stable 3. Occlusion of superior mesenteric artery status post resection 4. Acute renal failure resolved 5. Pulmonary embolism treated 6. Acute metabolic encephalopathy clinically improved 7. Cardiac arrest rhythm is been stable   I have personally seen and evaluated the patient, evaluated laboratory and imaging results, formulated the assessment and plan and placed orders. The Patient requires high complexity decision making for assessment and support.  Case was discussed on Rounds with the Respiratory Therapy Staff  Allyne Gee, MD [facility] Pulmonary Critical Care Medicine Sleep Medicine

## 2017-11-06 NOTE — Progress Notes (Signed)
Pulmonary [place]   PULMONARY SERVICE  PROGRESS NOTE  Date of Service: 11/06/2017  Luke Kaufman  [id number]  DOB: 1950-12-12   DOA: 10/09/2017  Referring Physician: Merton Border, MD  HPI: Luke Kaufman is a 67 y.o. male seen for follow up of Acute on Chronic Respiratory Failure.  Patient's capping has past capping trials should be able to  Be decannulated  Medications: Reviewed on Rounds  Physical Exam:  Vitals:  Temperature 97.0 degrees pulse 93 respiratory rate 14 blood pressure 153/74 saturations 100%  Ventilator Settings  Capping off the ventilator  . General: Comfortable at this time . Eyes: Grossly normal lids, irises & conjunctiva . ENT: grossly tongue is normal . Neck: no obvious mass . Cardiovascular: S1 S2 normal no gallop . Respiratory:  No rhonchi or rales are noted . Abdomen: soft . Skin: no rash seen on limited exam . Musculoskeletal: not rigid . Psychiatric:unable to assess . Neurologic: no seizure no involuntary movements         Lab Data:   Basic Metabolic Panel: No results for input(s): NA, K, CL, CO2, GLUCOSE, BUN, CREATININE, CALCIUM, MG, PHOS in the last 168 hours.  Liver Function Tests: No results for input(s): AST, ALT, ALKPHOS, BILITOT, PROT, ALBUMIN in the last 168 hours. No results for input(s): LIPASE, AMYLASE in the last 168 hours. No results for input(s): AMMONIA in the last 168 hours.  CBC: No results for input(s): WBC, NEUTROABS, HGB, HCT, MCV, PLT in the last 168 hours.  Cardiac Enzymes: No results for input(s): CKTOTAL, CKMB, CKMBINDEX, TROPONINI in the last 168 hours.  BNP (last 3 results) No results for input(s): BNP in the last 8760 hours.  ProBNP (last 3 results) No results for input(s): PROBNP in the last 8760 hours.  Radiological Exams: No results found.  Assessment/Plan Active Problems:   Acute on chronic respiratory failure with hypoxia (HCC)   Non-STEMI  (non-ST elevated myocardial infarction) (HCC)   Occlusion of superior mesenteric artery (HCC)   Acute renal failure (HCC)   Pulmonary embolism and infarction (HCC)   Acute metabolic encephalopathy   Cardiac arrest ([place])   1.  acute on chronic Respiratory failure with hypoxia will proceed to decannulation patient has been on room air.   2. Non STEMI no active chest pain 3. Occlusion of superior mesenteric artery treated 4. Acute renal failure resolved 5. Pulmonary embolism treated  6. Acute encephalopathy stable improved  7. Status post cardiac arrest no rhythm E is are noted   I have personally seen and evaluated the patient, evaluated laboratory and imaging results, formulated the assessment and plan and placed orders. The Patient requires high complexity decision making for assessment and support.  Case was discussed on Rounds with the Respiratory Therapy Staff  Allyne Gee, MD [facility] Pulmonary Critical Care Medicine Sleep Medicine

## 2017-11-07 DIAGNOSIS — S31109D Unspecified open wound of abdominal wall, unspecified quadrant without penetration into peritoneal cavity, subsequent encounter: Secondary | ICD-10-CM | POA: Diagnosis not present

## 2017-11-07 DIAGNOSIS — M6281 Muscle weakness (generalized): Secondary | ICD-10-CM | POA: Diagnosis not present

## 2017-11-07 DIAGNOSIS — E43 Unspecified severe protein-calorie malnutrition: Secondary | ICD-10-CM | POA: Diagnosis not present

## 2017-11-07 DIAGNOSIS — R42 Dizziness and giddiness: Secondary | ICD-10-CM | POA: Diagnosis not present

## 2017-11-07 DIAGNOSIS — Z743 Need for continuous supervision: Secondary | ICD-10-CM | POA: Diagnosis not present

## 2017-11-07 DIAGNOSIS — B351 Tinea unguium: Secondary | ICD-10-CM | POA: Diagnosis not present

## 2017-11-07 DIAGNOSIS — R0602 Shortness of breath: Secondary | ICD-10-CM | POA: Diagnosis not present

## 2017-11-07 DIAGNOSIS — Z9911 Dependence on respirator [ventilator] status: Secondary | ICD-10-CM | POA: Diagnosis not present

## 2017-11-07 DIAGNOSIS — I2510 Atherosclerotic heart disease of native coronary artery without angina pectoris: Secondary | ICD-10-CM | POA: Diagnosis not present

## 2017-11-07 DIAGNOSIS — Z432 Encounter for attention to ileostomy: Secondary | ICD-10-CM | POA: Diagnosis not present

## 2017-11-07 DIAGNOSIS — Z933 Colostomy status: Secondary | ICD-10-CM | POA: Diagnosis not present

## 2017-11-07 DIAGNOSIS — F339 Major depressive disorder, recurrent, unspecified: Secondary | ICD-10-CM | POA: Diagnosis not present

## 2017-11-07 DIAGNOSIS — F331 Major depressive disorder, recurrent, moderate: Secondary | ICD-10-CM | POA: Diagnosis not present

## 2017-11-07 DIAGNOSIS — I10 Essential (primary) hypertension: Secondary | ICD-10-CM | POA: Diagnosis not present

## 2017-11-07 DIAGNOSIS — G8104 Flaccid hemiplegia affecting left nondominant side: Secondary | ICD-10-CM | POA: Diagnosis not present

## 2017-11-07 DIAGNOSIS — R262 Difficulty in walking, not elsewhere classified: Secondary | ICD-10-CM | POA: Diagnosis not present

## 2017-11-07 DIAGNOSIS — Z930 Tracheostomy status: Secondary | ICD-10-CM | POA: Diagnosis not present

## 2017-11-07 DIAGNOSIS — Z7901 Long term (current) use of anticoagulants: Secondary | ICD-10-CM | POA: Diagnosis not present

## 2017-11-07 DIAGNOSIS — E46 Unspecified protein-calorie malnutrition: Secondary | ICD-10-CM | POA: Diagnosis not present

## 2017-11-07 DIAGNOSIS — K219 Gastro-esophageal reflux disease without esophagitis: Secondary | ICD-10-CM | POA: Diagnosis not present

## 2017-11-07 DIAGNOSIS — K559 Vascular disorder of intestine, unspecified: Secondary | ICD-10-CM | POA: Diagnosis not present

## 2017-11-07 DIAGNOSIS — R197 Diarrhea, unspecified: Secondary | ICD-10-CM | POA: Diagnosis not present

## 2017-11-07 DIAGNOSIS — F321 Major depressive disorder, single episode, moderate: Secondary | ICD-10-CM | POA: Diagnosis not present

## 2017-11-07 DIAGNOSIS — E440 Moderate protein-calorie malnutrition: Secondary | ICD-10-CM | POA: Diagnosis not present

## 2017-11-07 DIAGNOSIS — I69398 Other sequelae of cerebral infarction: Secondary | ICD-10-CM | POA: Diagnosis not present

## 2017-11-07 DIAGNOSIS — L309 Dermatitis, unspecified: Secondary | ICD-10-CM | POA: Diagnosis not present

## 2017-11-07 DIAGNOSIS — E559 Vitamin D deficiency, unspecified: Secondary | ICD-10-CM | POA: Diagnosis not present

## 2017-11-07 DIAGNOSIS — Z433 Encounter for attention to colostomy: Secondary | ICD-10-CM | POA: Diagnosis not present

## 2017-11-07 DIAGNOSIS — Z86718 Personal history of other venous thrombosis and embolism: Secondary | ICD-10-CM | POA: Diagnosis not present

## 2017-11-07 DIAGNOSIS — D72829 Elevated white blood cell count, unspecified: Secondary | ICD-10-CM | POA: Diagnosis not present

## 2017-11-07 DIAGNOSIS — R339 Retention of urine, unspecified: Secondary | ICD-10-CM | POA: Diagnosis not present

## 2017-11-07 DIAGNOSIS — Z8673 Personal history of transient ischemic attack (TIA), and cerebral infarction without residual deficits: Secondary | ICD-10-CM | POA: Diagnosis not present

## 2017-11-07 DIAGNOSIS — I4891 Unspecified atrial fibrillation: Secondary | ICD-10-CM | POA: Diagnosis not present

## 2017-11-07 DIAGNOSIS — I214 Non-ST elevation (NSTEMI) myocardial infarction: Secondary | ICD-10-CM | POA: Diagnosis not present

## 2017-11-07 DIAGNOSIS — N390 Urinary tract infection, site not specified: Secondary | ICD-10-CM | POA: Diagnosis not present

## 2017-11-07 DIAGNOSIS — R1312 Dysphagia, oropharyngeal phase: Secondary | ICD-10-CM | POA: Diagnosis not present

## 2017-11-07 DIAGNOSIS — R279 Unspecified lack of coordination: Secondary | ICD-10-CM | POA: Diagnosis not present

## 2017-11-07 DIAGNOSIS — G8194 Hemiplegia, unspecified affecting left nondominant side: Secondary | ICD-10-CM | POA: Diagnosis not present

## 2017-11-07 DIAGNOSIS — D500 Iron deficiency anemia secondary to blood loss (chronic): Secondary | ICD-10-CM | POA: Diagnosis not present

## 2017-11-07 DIAGNOSIS — L22 Diaper dermatitis: Secondary | ICD-10-CM | POA: Diagnosis not present

## 2017-11-07 DIAGNOSIS — R5381 Other malaise: Secondary | ICD-10-CM | POA: Diagnosis not present

## 2017-11-07 DIAGNOSIS — J9600 Acute respiratory failure, unspecified whether with hypoxia or hypercapnia: Secondary | ICD-10-CM | POA: Diagnosis not present

## 2017-11-07 DIAGNOSIS — N178 Other acute kidney failure: Secondary | ICD-10-CM | POA: Diagnosis not present

## 2017-11-07 DIAGNOSIS — R0989 Other specified symptoms and signs involving the circulatory and respiratory systems: Secondary | ICD-10-CM | POA: Diagnosis not present

## 2017-11-07 DIAGNOSIS — R531 Weakness: Secondary | ICD-10-CM | POA: Diagnosis not present

## 2017-11-07 DIAGNOSIS — I739 Peripheral vascular disease, unspecified: Secondary | ICD-10-CM | POA: Diagnosis not present

## 2017-11-07 DIAGNOSIS — G8929 Other chronic pain: Secondary | ICD-10-CM | POA: Diagnosis not present

## 2017-11-07 DIAGNOSIS — I6990 Unspecified sequelae of unspecified cerebrovascular disease: Secondary | ICD-10-CM | POA: Diagnosis not present

## 2017-11-07 DIAGNOSIS — L03319 Cellulitis of trunk, unspecified: Secondary | ICD-10-CM | POA: Diagnosis not present

## 2017-11-07 DIAGNOSIS — R062 Wheezing: Secondary | ICD-10-CM | POA: Diagnosis not present

## 2017-11-07 DIAGNOSIS — A881 Epidemic vertigo: Secondary | ICD-10-CM | POA: Diagnosis not present

## 2017-11-07 DIAGNOSIS — J449 Chronic obstructive pulmonary disease, unspecified: Secondary | ICD-10-CM | POA: Diagnosis not present

## 2017-11-07 DIAGNOSIS — F419 Anxiety disorder, unspecified: Secondary | ICD-10-CM | POA: Diagnosis not present

## 2017-11-07 DIAGNOSIS — L98499 Non-pressure chronic ulcer of skin of other sites with unspecified severity: Secondary | ICD-10-CM | POA: Diagnosis not present

## 2017-11-07 DIAGNOSIS — G9349 Other encephalopathy: Secondary | ICD-10-CM | POA: Diagnosis not present

## 2017-11-07 DIAGNOSIS — R110 Nausea: Secondary | ICD-10-CM | POA: Diagnosis not present

## 2017-11-07 DIAGNOSIS — G9009 Other idiopathic peripheral autonomic neuropathy: Secondary | ICD-10-CM | POA: Diagnosis not present

## 2017-11-07 DIAGNOSIS — I2699 Other pulmonary embolism without acute cor pulmonale: Secondary | ICD-10-CM | POA: Diagnosis not present

## 2017-11-07 DIAGNOSIS — L988 Other specified disorders of the skin and subcutaneous tissue: Secondary | ICD-10-CM | POA: Diagnosis not present

## 2017-11-07 DIAGNOSIS — R3915 Urgency of urination: Secondary | ICD-10-CM | POA: Diagnosis not present

## 2017-11-07 DIAGNOSIS — R5383 Other fatigue: Secondary | ICD-10-CM | POA: Diagnosis not present

## 2017-11-07 DIAGNOSIS — Z9049 Acquired absence of other specified parts of digestive tract: Secondary | ICD-10-CM | POA: Diagnosis not present

## 2017-11-11 DIAGNOSIS — G8194 Hemiplegia, unspecified affecting left nondominant side: Secondary | ICD-10-CM | POA: Diagnosis not present

## 2017-11-11 DIAGNOSIS — S31109D Unspecified open wound of abdominal wall, unspecified quadrant without penetration into peritoneal cavity, subsequent encounter: Secondary | ICD-10-CM | POA: Diagnosis not present

## 2017-11-11 DIAGNOSIS — M6281 Muscle weakness (generalized): Secondary | ICD-10-CM | POA: Diagnosis not present

## 2017-11-11 DIAGNOSIS — I69398 Other sequelae of cerebral infarction: Secondary | ICD-10-CM | POA: Diagnosis not present

## 2017-11-11 DIAGNOSIS — F321 Major depressive disorder, single episode, moderate: Secondary | ICD-10-CM | POA: Diagnosis not present

## 2017-11-11 DIAGNOSIS — R262 Difficulty in walking, not elsewhere classified: Secondary | ICD-10-CM | POA: Diagnosis not present

## 2017-11-11 DIAGNOSIS — R5381 Other malaise: Secondary | ICD-10-CM | POA: Diagnosis not present

## 2017-11-12 DIAGNOSIS — S31109D Unspecified open wound of abdominal wall, unspecified quadrant without penetration into peritoneal cavity, subsequent encounter: Secondary | ICD-10-CM | POA: Diagnosis not present

## 2017-11-12 DIAGNOSIS — I4891 Unspecified atrial fibrillation: Secondary | ICD-10-CM | POA: Diagnosis not present

## 2017-11-12 DIAGNOSIS — Z7901 Long term (current) use of anticoagulants: Secondary | ICD-10-CM | POA: Diagnosis not present

## 2017-11-13 DIAGNOSIS — L98499 Non-pressure chronic ulcer of skin of other sites with unspecified severity: Secondary | ICD-10-CM | POA: Diagnosis not present

## 2017-11-13 DIAGNOSIS — R262 Difficulty in walking, not elsewhere classified: Secondary | ICD-10-CM | POA: Diagnosis not present

## 2017-11-13 DIAGNOSIS — R110 Nausea: Secondary | ICD-10-CM | POA: Diagnosis not present

## 2017-11-13 DIAGNOSIS — A881 Epidemic vertigo: Secondary | ICD-10-CM | POA: Diagnosis not present

## 2017-11-13 DIAGNOSIS — M6281 Muscle weakness (generalized): Secondary | ICD-10-CM | POA: Diagnosis not present

## 2017-11-13 DIAGNOSIS — Z7901 Long term (current) use of anticoagulants: Secondary | ICD-10-CM | POA: Diagnosis not present

## 2017-11-13 DIAGNOSIS — I4891 Unspecified atrial fibrillation: Secondary | ICD-10-CM | POA: Diagnosis not present

## 2017-11-13 DIAGNOSIS — R5381 Other malaise: Secondary | ICD-10-CM | POA: Diagnosis not present

## 2017-11-13 DIAGNOSIS — I69398 Other sequelae of cerebral infarction: Secondary | ICD-10-CM | POA: Diagnosis not present

## 2017-11-17 ENCOUNTER — Other Ambulatory Visit: Payer: Self-pay | Admitting: *Deleted

## 2017-11-17 DIAGNOSIS — G8104 Flaccid hemiplegia affecting left nondominant side: Secondary | ICD-10-CM | POA: Diagnosis not present

## 2017-11-17 DIAGNOSIS — Z433 Encounter for attention to colostomy: Secondary | ICD-10-CM | POA: Diagnosis not present

## 2017-11-17 DIAGNOSIS — R0602 Shortness of breath: Secondary | ICD-10-CM | POA: Diagnosis not present

## 2017-11-17 DIAGNOSIS — R0989 Other specified symptoms and signs involving the circulatory and respiratory systems: Secondary | ICD-10-CM | POA: Diagnosis not present

## 2017-11-17 NOTE — Patient Outreach (Signed)
[facility]) Care Management  11/17/2017  Luke Kaufman 1950/09/07 [id number]   Met with Shalonda,discharge planner at facility. She reports that patient sister is involved. Patient lived home alone before this admission. However, unsure if he can go back to this situation. Sister is applying for Medicaid, patient may need either LTC or ALF at discharge.  Will collaborate with [facility] UM and will monitor for any [facility] community care management needs.  Royetta Crochet. Laymond Purser, RN, BSN, [place] [phone]) Business Cell  [phone]) Toll Free Office

## 2017-11-20 DIAGNOSIS — I69398 Other sequelae of cerebral infarction: Secondary | ICD-10-CM | POA: Diagnosis not present

## 2017-11-20 DIAGNOSIS — L98499 Non-pressure chronic ulcer of skin of other sites with unspecified severity: Secondary | ICD-10-CM | POA: Diagnosis not present

## 2017-11-20 DIAGNOSIS — R5381 Other malaise: Secondary | ICD-10-CM | POA: Diagnosis not present

## 2017-11-20 DIAGNOSIS — L988 Other specified disorders of the skin and subcutaneous tissue: Secondary | ICD-10-CM | POA: Diagnosis not present

## 2017-11-20 DIAGNOSIS — M6281 Muscle weakness (generalized): Secondary | ICD-10-CM | POA: Diagnosis not present

## 2017-11-20 DIAGNOSIS — R262 Difficulty in walking, not elsewhere classified: Secondary | ICD-10-CM | POA: Diagnosis not present

## 2017-11-25 ENCOUNTER — Other Ambulatory Visit: Payer: Self-pay | Admitting: *Deleted

## 2017-11-25 NOTE — Patient Outreach (Signed)
[facility]) Care Management  11/25/2017  Luke Kaufman March 28, 1951 [id number]   Met with [name], dcp at facility. They report that patient has issues with ostomy, needing multiple changes He also has issues with his hand and will not be able to be independent with ostomy They do say that family has applied for Medicaid but want them to meet with patient to discuss the need for LTC placement. They plan to meet with patient soon to discuss options, do not feel he could qualify for ALF  Plan to monitor for [facility] CM needs at discharge Plan to collaborate with [facility] UM

## 2017-11-27 DIAGNOSIS — R062 Wheezing: Secondary | ICD-10-CM | POA: Diagnosis not present

## 2017-11-27 DIAGNOSIS — L309 Dermatitis, unspecified: Secondary | ICD-10-CM | POA: Diagnosis not present

## 2017-11-27 DIAGNOSIS — L98499 Non-pressure chronic ulcer of skin of other sites with unspecified severity: Secondary | ICD-10-CM | POA: Diagnosis not present

## 2017-11-27 DIAGNOSIS — R0602 Shortness of breath: Secondary | ICD-10-CM | POA: Diagnosis not present

## 2017-11-27 DIAGNOSIS — J449 Chronic obstructive pulmonary disease, unspecified: Secondary | ICD-10-CM | POA: Diagnosis not present

## 2017-11-28 DIAGNOSIS — R339 Retention of urine, unspecified: Secondary | ICD-10-CM | POA: Diagnosis not present

## 2017-11-28 DIAGNOSIS — R3915 Urgency of urination: Secondary | ICD-10-CM | POA: Diagnosis not present

## 2017-11-28 DIAGNOSIS — I6990 Unspecified sequelae of unspecified cerebrovascular disease: Secondary | ICD-10-CM | POA: Diagnosis not present

## 2017-11-28 DIAGNOSIS — G8194 Hemiplegia, unspecified affecting left nondominant side: Secondary | ICD-10-CM | POA: Diagnosis not present

## 2017-12-01 DIAGNOSIS — R062 Wheezing: Secondary | ICD-10-CM | POA: Diagnosis not present

## 2017-12-01 DIAGNOSIS — J449 Chronic obstructive pulmonary disease, unspecified: Secondary | ICD-10-CM | POA: Diagnosis not present

## 2017-12-01 DIAGNOSIS — I6990 Unspecified sequelae of unspecified cerebrovascular disease: Secondary | ICD-10-CM | POA: Diagnosis not present

## 2017-12-01 DIAGNOSIS — I2510 Atherosclerotic heart disease of native coronary artery without angina pectoris: Secondary | ICD-10-CM | POA: Diagnosis not present

## 2017-12-02 DIAGNOSIS — I6990 Unspecified sequelae of unspecified cerebrovascular disease: Secondary | ICD-10-CM | POA: Diagnosis not present

## 2017-12-02 DIAGNOSIS — N390 Urinary tract infection, site not specified: Secondary | ICD-10-CM | POA: Diagnosis not present

## 2017-12-02 DIAGNOSIS — R262 Difficulty in walking, not elsewhere classified: Secondary | ICD-10-CM | POA: Diagnosis not present

## 2017-12-02 DIAGNOSIS — I69398 Other sequelae of cerebral infarction: Secondary | ICD-10-CM | POA: Diagnosis not present

## 2017-12-02 DIAGNOSIS — R339 Retention of urine, unspecified: Secondary | ICD-10-CM | POA: Diagnosis not present

## 2017-12-02 DIAGNOSIS — R5381 Other malaise: Secondary | ICD-10-CM | POA: Diagnosis not present

## 2017-12-02 DIAGNOSIS — M6281 Muscle weakness (generalized): Secondary | ICD-10-CM | POA: Diagnosis not present

## 2017-12-02 DIAGNOSIS — G8104 Flaccid hemiplegia affecting left nondominant side: Secondary | ICD-10-CM | POA: Diagnosis not present

## 2017-12-03 DIAGNOSIS — I6990 Unspecified sequelae of unspecified cerebrovascular disease: Secondary | ICD-10-CM | POA: Diagnosis not present

## 2017-12-03 DIAGNOSIS — N390 Urinary tract infection, site not specified: Secondary | ICD-10-CM | POA: Diagnosis not present

## 2017-12-03 DIAGNOSIS — R339 Retention of urine, unspecified: Secondary | ICD-10-CM | POA: Diagnosis not present

## 2017-12-03 DIAGNOSIS — G8104 Flaccid hemiplegia affecting left nondominant side: Secondary | ICD-10-CM | POA: Diagnosis not present

## 2017-12-04 DIAGNOSIS — L98499 Non-pressure chronic ulcer of skin of other sites with unspecified severity: Secondary | ICD-10-CM | POA: Diagnosis not present

## 2017-12-05 DIAGNOSIS — R42 Dizziness and giddiness: Secondary | ICD-10-CM | POA: Diagnosis not present

## 2017-12-05 DIAGNOSIS — R339 Retention of urine, unspecified: Secondary | ICD-10-CM | POA: Diagnosis not present

## 2017-12-05 DIAGNOSIS — N390 Urinary tract infection, site not specified: Secondary | ICD-10-CM | POA: Diagnosis not present

## 2017-12-05 DIAGNOSIS — M6281 Muscle weakness (generalized): Secondary | ICD-10-CM | POA: Diagnosis not present

## 2017-12-12 DIAGNOSIS — L98499 Non-pressure chronic ulcer of skin of other sites with unspecified severity: Secondary | ICD-10-CM | POA: Diagnosis not present

## 2017-12-17 ENCOUNTER — Other Ambulatory Visit: Payer: Self-pay | Admitting: *Deleted

## 2017-12-17 NOTE — Patient Outreach (Signed)
[facility]) Care Management  12/17/2017  Luke Kaufman 12/16/1950 [id number]   Onsite visit at [facility], met with [name], discharge planner.  She confirms that patient will be LTC upon discharge. She reports that he is doing well, but due to dependence with ostomy care will need LTC placement.  No [facility] care management needs assessed at this time as he will be LTC. Plan to sign off. Royetta Crochet. Laymond Purser, RN, BSN, [place] [phone]) Business Cell  [phone]) Toll Free Office

## 2017-12-18 DIAGNOSIS — L98499 Non-pressure chronic ulcer of skin of other sites with unspecified severity: Secondary | ICD-10-CM | POA: Diagnosis not present

## 2017-12-25 DIAGNOSIS — R197 Diarrhea, unspecified: Secondary | ICD-10-CM | POA: Diagnosis not present

## 2017-12-25 DIAGNOSIS — L22 Diaper dermatitis: Secondary | ICD-10-CM | POA: Diagnosis not present

## 2017-12-25 DIAGNOSIS — L98499 Non-pressure chronic ulcer of skin of other sites with unspecified severity: Secondary | ICD-10-CM | POA: Diagnosis not present

## 2017-12-25 DIAGNOSIS — L309 Dermatitis, unspecified: Secondary | ICD-10-CM | POA: Diagnosis not present

## 2017-12-25 DIAGNOSIS — I6990 Unspecified sequelae of unspecified cerebrovascular disease: Secondary | ICD-10-CM | POA: Diagnosis not present

## 2017-12-30 DIAGNOSIS — R262 Difficulty in walking, not elsewhere classified: Secondary | ICD-10-CM | POA: Diagnosis not present

## 2017-12-30 DIAGNOSIS — R5381 Other malaise: Secondary | ICD-10-CM | POA: Diagnosis not present

## 2017-12-30 DIAGNOSIS — M6281 Muscle weakness (generalized): Secondary | ICD-10-CM | POA: Diagnosis not present

## 2017-12-30 DIAGNOSIS — I69398 Other sequelae of cerebral infarction: Secondary | ICD-10-CM | POA: Diagnosis not present

## 2018-01-01 DIAGNOSIS — R262 Difficulty in walking, not elsewhere classified: Secondary | ICD-10-CM | POA: Diagnosis not present

## 2018-01-01 DIAGNOSIS — I69398 Other sequelae of cerebral infarction: Secondary | ICD-10-CM | POA: Diagnosis not present

## 2018-01-01 DIAGNOSIS — L98499 Non-pressure chronic ulcer of skin of other sites with unspecified severity: Secondary | ICD-10-CM | POA: Diagnosis not present

## 2018-01-01 DIAGNOSIS — R5381 Other malaise: Secondary | ICD-10-CM | POA: Diagnosis not present

## 2018-01-01 DIAGNOSIS — M6281 Muscle weakness (generalized): Secondary | ICD-10-CM | POA: Diagnosis not present

## 2018-01-02 DIAGNOSIS — I2510 Atherosclerotic heart disease of native coronary artery without angina pectoris: Secondary | ICD-10-CM | POA: Diagnosis not present

## 2018-01-02 DIAGNOSIS — I10 Essential (primary) hypertension: Secondary | ICD-10-CM | POA: Diagnosis not present

## 2018-01-02 DIAGNOSIS — I739 Peripheral vascular disease, unspecified: Secondary | ICD-10-CM | POA: Diagnosis not present

## 2018-01-02 DIAGNOSIS — F331 Major depressive disorder, recurrent, moderate: Secondary | ICD-10-CM | POA: Diagnosis not present

## 2018-01-06 DIAGNOSIS — R5381 Other malaise: Secondary | ICD-10-CM | POA: Diagnosis not present

## 2018-01-06 DIAGNOSIS — R262 Difficulty in walking, not elsewhere classified: Secondary | ICD-10-CM | POA: Diagnosis not present

## 2018-01-06 DIAGNOSIS — I69398 Other sequelae of cerebral infarction: Secondary | ICD-10-CM | POA: Diagnosis not present

## 2018-01-06 DIAGNOSIS — M6281 Muscle weakness (generalized): Secondary | ICD-10-CM | POA: Diagnosis not present

## 2018-01-07 DIAGNOSIS — D72829 Elevated white blood cell count, unspecified: Secondary | ICD-10-CM | POA: Diagnosis not present

## 2018-01-07 DIAGNOSIS — E559 Vitamin D deficiency, unspecified: Secondary | ICD-10-CM | POA: Diagnosis not present

## 2018-01-07 DIAGNOSIS — G8929 Other chronic pain: Secondary | ICD-10-CM | POA: Diagnosis not present

## 2018-01-07 DIAGNOSIS — R5383 Other fatigue: Secondary | ICD-10-CM | POA: Diagnosis not present

## 2018-01-08 DIAGNOSIS — R5381 Other malaise: Secondary | ICD-10-CM | POA: Diagnosis not present

## 2018-01-08 DIAGNOSIS — R262 Difficulty in walking, not elsewhere classified: Secondary | ICD-10-CM | POA: Diagnosis not present

## 2018-01-08 DIAGNOSIS — L98499 Non-pressure chronic ulcer of skin of other sites with unspecified severity: Secondary | ICD-10-CM | POA: Diagnosis not present

## 2018-01-08 DIAGNOSIS — M6281 Muscle weakness (generalized): Secondary | ICD-10-CM | POA: Diagnosis not present

## 2018-01-08 DIAGNOSIS — I69398 Other sequelae of cerebral infarction: Secondary | ICD-10-CM | POA: Diagnosis not present

## 2018-01-13 DIAGNOSIS — D72829 Elevated white blood cell count, unspecified: Secondary | ICD-10-CM | POA: Diagnosis not present

## 2018-01-13 DIAGNOSIS — R5381 Other malaise: Secondary | ICD-10-CM | POA: Diagnosis not present

## 2018-01-13 DIAGNOSIS — M6281 Muscle weakness (generalized): Secondary | ICD-10-CM | POA: Diagnosis not present

## 2018-01-13 DIAGNOSIS — J449 Chronic obstructive pulmonary disease, unspecified: Secondary | ICD-10-CM | POA: Diagnosis not present

## 2018-01-13 DIAGNOSIS — R262 Difficulty in walking, not elsewhere classified: Secondary | ICD-10-CM | POA: Diagnosis not present

## 2018-01-13 DIAGNOSIS — I69398 Other sequelae of cerebral infarction: Secondary | ICD-10-CM | POA: Diagnosis not present

## 2018-01-13 DIAGNOSIS — S31109D Unspecified open wound of abdominal wall, unspecified quadrant without penetration into peritoneal cavity, subsequent encounter: Secondary | ICD-10-CM | POA: Diagnosis not present

## 2018-01-14 DIAGNOSIS — S31109D Unspecified open wound of abdominal wall, unspecified quadrant without penetration into peritoneal cavity, subsequent encounter: Secondary | ICD-10-CM | POA: Diagnosis not present

## 2018-01-14 DIAGNOSIS — D72829 Elevated white blood cell count, unspecified: Secondary | ICD-10-CM | POA: Diagnosis not present

## 2018-01-14 DIAGNOSIS — L03319 Cellulitis of trunk, unspecified: Secondary | ICD-10-CM | POA: Diagnosis not present

## 2018-01-15 DIAGNOSIS — L98499 Non-pressure chronic ulcer of skin of other sites with unspecified severity: Secondary | ICD-10-CM | POA: Diagnosis not present

## 2018-01-16 DIAGNOSIS — S31109D Unspecified open wound of abdominal wall, unspecified quadrant without penetration into peritoneal cavity, subsequent encounter: Secondary | ICD-10-CM | POA: Diagnosis not present

## 2018-01-16 DIAGNOSIS — R197 Diarrhea, unspecified: Secondary | ICD-10-CM | POA: Diagnosis not present

## 2018-01-16 DIAGNOSIS — D72829 Elevated white blood cell count, unspecified: Secondary | ICD-10-CM | POA: Diagnosis not present

## 2018-01-16 DIAGNOSIS — Z933 Colostomy status: Secondary | ICD-10-CM | POA: Diagnosis not present

## 2018-01-22 DIAGNOSIS — L98499 Non-pressure chronic ulcer of skin of other sites with unspecified severity: Secondary | ICD-10-CM | POA: Diagnosis not present

## 2018-01-26 DIAGNOSIS — R062 Wheezing: Secondary | ICD-10-CM | POA: Diagnosis not present

## 2018-01-26 DIAGNOSIS — J449 Chronic obstructive pulmonary disease, unspecified: Secondary | ICD-10-CM | POA: Diagnosis not present

## 2018-01-26 DIAGNOSIS — G8104 Flaccid hemiplegia affecting left nondominant side: Secondary | ICD-10-CM | POA: Diagnosis not present

## 2018-01-26 DIAGNOSIS — I6990 Unspecified sequelae of unspecified cerebrovascular disease: Secondary | ICD-10-CM | POA: Diagnosis not present

## 2018-01-29 DIAGNOSIS — L98499 Non-pressure chronic ulcer of skin of other sites with unspecified severity: Secondary | ICD-10-CM | POA: Diagnosis not present

## 2018-02-05 DIAGNOSIS — L98499 Non-pressure chronic ulcer of skin of other sites with unspecified severity: Secondary | ICD-10-CM | POA: Diagnosis not present

## 2018-02-11 DIAGNOSIS — F419 Anxiety disorder, unspecified: Secondary | ICD-10-CM | POA: Diagnosis not present

## 2018-02-11 DIAGNOSIS — J449 Chronic obstructive pulmonary disease, unspecified: Secondary | ICD-10-CM | POA: Diagnosis not present

## 2018-02-11 DIAGNOSIS — M6281 Muscle weakness (generalized): Secondary | ICD-10-CM | POA: Diagnosis not present

## 2018-02-11 DIAGNOSIS — I739 Peripheral vascular disease, unspecified: Secondary | ICD-10-CM | POA: Diagnosis not present

## 2018-02-12 DIAGNOSIS — K559 Vascular disorder of intestine, unspecified: Secondary | ICD-10-CM | POA: Diagnosis not present

## 2018-02-12 DIAGNOSIS — Z432 Encounter for attention to ileostomy: Secondary | ICD-10-CM | POA: Diagnosis not present

## 2018-02-12 DIAGNOSIS — L98499 Non-pressure chronic ulcer of skin of other sites with unspecified severity: Secondary | ICD-10-CM | POA: Diagnosis not present

## 2018-02-13 DIAGNOSIS — J449 Chronic obstructive pulmonary disease, unspecified: Secondary | ICD-10-CM | POA: Diagnosis not present

## 2018-02-13 DIAGNOSIS — F331 Major depressive disorder, recurrent, moderate: Secondary | ICD-10-CM | POA: Diagnosis not present

## 2018-02-13 DIAGNOSIS — M6281 Muscle weakness (generalized): Secondary | ICD-10-CM | POA: Diagnosis not present

## 2018-02-13 DIAGNOSIS — F419 Anxiety disorder, unspecified: Secondary | ICD-10-CM | POA: Diagnosis not present

## 2018-02-16 DIAGNOSIS — B351 Tinea unguium: Secondary | ICD-10-CM | POA: Diagnosis not present

## 2018-02-16 DIAGNOSIS — I739 Peripheral vascular disease, unspecified: Secondary | ICD-10-CM | POA: Diagnosis not present

## 2018-02-16 DIAGNOSIS — M2042 Other hammer toe(s) (acquired), left foot: Secondary | ICD-10-CM | POA: Diagnosis not present

## 2018-02-16 DIAGNOSIS — M2041 Other hammer toe(s) (acquired), right foot: Secondary | ICD-10-CM | POA: Diagnosis not present

## 2018-02-19 DIAGNOSIS — Z933 Colostomy status: Secondary | ICD-10-CM | POA: Diagnosis not present

## 2018-02-19 DIAGNOSIS — R197 Diarrhea, unspecified: Secondary | ICD-10-CM | POA: Diagnosis not present

## 2018-02-19 DIAGNOSIS — L98499 Non-pressure chronic ulcer of skin of other sites with unspecified severity: Secondary | ICD-10-CM | POA: Diagnosis not present

## 2018-02-19 DIAGNOSIS — S31109D Unspecified open wound of abdominal wall, unspecified quadrant without penetration into peritoneal cavity, subsequent encounter: Secondary | ICD-10-CM | POA: Diagnosis not present

## 2018-02-19 DIAGNOSIS — L22 Diaper dermatitis: Secondary | ICD-10-CM | POA: Diagnosis not present

## 2018-02-20 DIAGNOSIS — Z933 Colostomy status: Secondary | ICD-10-CM | POA: Diagnosis not present

## 2018-02-20 DIAGNOSIS — S31109D Unspecified open wound of abdominal wall, unspecified quadrant without penetration into peritoneal cavity, subsequent encounter: Secondary | ICD-10-CM | POA: Diagnosis not present

## 2018-02-20 DIAGNOSIS — L22 Diaper dermatitis: Secondary | ICD-10-CM | POA: Diagnosis not present

## 2018-02-20 DIAGNOSIS — R197 Diarrhea, unspecified: Secondary | ICD-10-CM | POA: Diagnosis not present

## 2018-02-23 DIAGNOSIS — I10 Essential (primary) hypertension: Secondary | ICD-10-CM | POA: Diagnosis not present

## 2018-02-23 DIAGNOSIS — Z5181 Encounter for therapeutic drug level monitoring: Secondary | ICD-10-CM | POA: Diagnosis not present

## 2018-02-23 DIAGNOSIS — N19 Unspecified kidney failure: Secondary | ICD-10-CM | POA: Diagnosis not present

## 2018-02-24 DIAGNOSIS — Z933 Colostomy status: Secondary | ICD-10-CM | POA: Diagnosis not present

## 2018-02-24 DIAGNOSIS — L22 Diaper dermatitis: Secondary | ICD-10-CM | POA: Diagnosis not present

## 2018-02-24 DIAGNOSIS — R197 Diarrhea, unspecified: Secondary | ICD-10-CM | POA: Diagnosis not present

## 2018-02-24 DIAGNOSIS — S31109D Unspecified open wound of abdominal wall, unspecified quadrant without penetration into peritoneal cavity, subsequent encounter: Secondary | ICD-10-CM | POA: Diagnosis not present

## 2018-02-26 DIAGNOSIS — L98499 Non-pressure chronic ulcer of skin of other sites with unspecified severity: Secondary | ICD-10-CM | POA: Diagnosis not present

## 2018-02-26 DIAGNOSIS — S31109D Unspecified open wound of abdominal wall, unspecified quadrant without penetration into peritoneal cavity, subsequent encounter: Secondary | ICD-10-CM | POA: Diagnosis not present

## 2018-02-26 DIAGNOSIS — Z933 Colostomy status: Secondary | ICD-10-CM | POA: Diagnosis not present

## 2018-02-26 DIAGNOSIS — R197 Diarrhea, unspecified: Secondary | ICD-10-CM | POA: Diagnosis not present

## 2018-02-26 DIAGNOSIS — L22 Diaper dermatitis: Secondary | ICD-10-CM | POA: Diagnosis not present

## 2018-03-02 DIAGNOSIS — L22 Diaper dermatitis: Secondary | ICD-10-CM | POA: Diagnosis not present

## 2018-03-02 DIAGNOSIS — J449 Chronic obstructive pulmonary disease, unspecified: Secondary | ICD-10-CM | POA: Diagnosis not present

## 2018-03-02 DIAGNOSIS — R0602 Shortness of breath: Secondary | ICD-10-CM | POA: Diagnosis not present

## 2018-03-02 DIAGNOSIS — R062 Wheezing: Secondary | ICD-10-CM | POA: Diagnosis not present

## 2018-03-04 DIAGNOSIS — Z139 Encounter for screening, unspecified: Secondary | ICD-10-CM | POA: Diagnosis not present

## 2018-03-04 DIAGNOSIS — Z5181 Encounter for therapeutic drug level monitoring: Secondary | ICD-10-CM | POA: Diagnosis not present

## 2018-03-05 DIAGNOSIS — L98499 Non-pressure chronic ulcer of skin of other sites with unspecified severity: Secondary | ICD-10-CM | POA: Diagnosis not present

## 2018-03-11 DIAGNOSIS — J449 Chronic obstructive pulmonary disease, unspecified: Secondary | ICD-10-CM | POA: Diagnosis not present

## 2018-03-11 DIAGNOSIS — R0602 Shortness of breath: Secondary | ICD-10-CM | POA: Diagnosis not present

## 2018-03-11 DIAGNOSIS — G8104 Flaccid hemiplegia affecting left nondominant side: Secondary | ICD-10-CM | POA: Diagnosis not present

## 2018-03-11 DIAGNOSIS — R062 Wheezing: Secondary | ICD-10-CM | POA: Diagnosis not present

## 2018-03-12 DIAGNOSIS — L98499 Non-pressure chronic ulcer of skin of other sites with unspecified severity: Secondary | ICD-10-CM | POA: Diagnosis not present

## 2018-03-17 DIAGNOSIS — R0602 Shortness of breath: Secondary | ICD-10-CM | POA: Diagnosis not present

## 2018-03-17 DIAGNOSIS — G8104 Flaccid hemiplegia affecting left nondominant side: Secondary | ICD-10-CM | POA: Diagnosis not present

## 2018-03-17 DIAGNOSIS — R062 Wheezing: Secondary | ICD-10-CM | POA: Diagnosis not present

## 2018-03-17 DIAGNOSIS — J449 Chronic obstructive pulmonary disease, unspecified: Secondary | ICD-10-CM | POA: Diagnosis not present

## 2018-03-18 DIAGNOSIS — L98499 Non-pressure chronic ulcer of skin of other sites with unspecified severity: Secondary | ICD-10-CM | POA: Diagnosis not present

## 2018-03-26 DIAGNOSIS — L98499 Non-pressure chronic ulcer of skin of other sites with unspecified severity: Secondary | ICD-10-CM | POA: Diagnosis not present

## 2018-03-30 ENCOUNTER — Encounter: Payer: Self-pay | Admitting: Internal Medicine

## 2018-03-30 ENCOUNTER — Ambulatory Visit (INDEPENDENT_AMBULATORY_CARE_PROVIDER_SITE_OTHER): Payer: PPO | Admitting: Internal Medicine

## 2018-03-30 VITALS — BP 128/82 | HR 82 | Ht 68.0 in | Wt 194.0 lb

## 2018-03-30 DIAGNOSIS — R058 Other specified cough: Secondary | ICD-10-CM

## 2018-03-30 DIAGNOSIS — R0609 Other forms of dyspnea: Secondary | ICD-10-CM | POA: Diagnosis not present

## 2018-03-30 DIAGNOSIS — R05 Cough: Secondary | ICD-10-CM

## 2018-03-30 DIAGNOSIS — G8104 Flaccid hemiplegia affecting left nondominant side: Secondary | ICD-10-CM | POA: Diagnosis not present

## 2018-03-30 DIAGNOSIS — L22 Diaper dermatitis: Secondary | ICD-10-CM | POA: Diagnosis not present

## 2018-03-30 DIAGNOSIS — S31109D Unspecified open wound of abdominal wall, unspecified quadrant without penetration into peritoneal cavity, subsequent encounter: Secondary | ICD-10-CM | POA: Diagnosis not present

## 2018-03-30 DIAGNOSIS — Z933 Colostomy status: Secondary | ICD-10-CM | POA: Diagnosis not present

## 2018-03-30 NOTE — Progress Notes (Signed)
Luke Kaufman, male    DOB: 04-29-1950      MRN: [id number]    Brief patient profile:  67 yo  Quit smoking 08/2017 at admit:   Admit date: 09/03/2017 Discharge date: 10/09/2017   Recommendations for Outpatient Follow-up: Mesenteric ischemia 1.Exploratory laparotomy right hemicolectomy 09/04/2017 Dr. Stark Klein 2.Abdominal exploration, application of temporary abdominal closure device(ABTHERA), 09/04/2017 Dr. Nadeen Landau 3.Oratory laparotomy, ileostomy, application of open abdominal wound VAC, 09/06/2017 Dr. Georganna Skeans 4. Open abdomen for attempted closure, 09/08/2017 Dr. Judeth Horn 5. Oratory laparotomy with possible wound closure(prior sutures upper&lower portion of the fascia had broken), 09/10/2017 Dr. Judeth Horn 6. Reexploration open abdominal wound, application ofABTHERAWOUND VAC,09/14/2017 Dr. Greer Pickerel 7. Exploratory laparotomy and partial closure of abdomen using the elastic tension closure device (ABRA), Dr. Hulen Skains, 05/29 8. exploratory laparotomy and dressing change, Dr. Ninfa Linden, 05/31 9. Changed negative pressure dressing, Dr. Kieth Brightly, 06/03 10.reopening of recent laparotomy, incisional hernia repair, insertion of mesh, placement of negative pressure dressing,Dr. Kieth Brightly, 06/04 11. Bedside vac change, Dr. Kieth Brightly, 06/07-6/17 [facility] to take over on 6/19  Acute respiratory failure with hypoxia/pulmonary edema -5/24S/P tracheostomy -Scopolamine patch placed secondary to copious secretions -Currently patient with confusion unable to participate in PMV trials  V. fib arrest -V. fib arrest 6/11: CPR performed for 11 minutes, defibrillation/cardioversion x2withROSC  -Cardiac catheterization per cardiology when more stable  CAD/NSTEMI/ -Echocardiogram EF 40 to 45% with severe hypokinesis: Repeat echocardiogram 6/14 EF back to normal -Strict in and out since admission +3.2 L -Daily weight       Filed Weights   10/07/17 0437 10/08/17  0404 10/09/17 0458  Weight: 254 lb 6.6 oz (115.4 kg) 255 lb 11.7 oz (116 kg) 252 lb 3.3 oz (114.4 kg)  -Transfuse for hemoglobin<8 -6/19transfuse 1 unit PRBC  New onset A. fib - Onset post V. fib arrest - Currently NSR - Heparin drip  Essential HTN -See NSTEMI   COPD -Patient with tracheostomy currently not an issue.  LLLPE/DVT RUE brachial vein/SVT LUE basilic and cephalic vein/DVT LLE, from wall and femoral veins - Heparin drip - OnceCorTraktube replaced would start patient onDMARD(Eliquis).  Prior to starting clear with surgery.  PVD   Acute kidney injury LastLabs           Recent Labs  Lab 10/03/17 1954 10/04/17 0223 10/05/17 0712 10/06/17 0445 10/07/17 0455 10/08/17 0500 10/09/17 0500  CREATININE 1.40* 1.38* 1.27* 1.22 1.20 1.20 1.15    -Resolved  Acute encephalopathy with hypoactive delirium - Unsure patient's baseline. Patient would not follow commands today. Appears agitated. In restraints -Residual left-sided weakness per epic note however patient unable to cooperate to evaluate -CT head 6/11negative  Left Hemiplegia new onset - Per surgery who knows patient very well prior to patient's current illness patient with A/O x4 performing all ADLs walking talking.  Now with LEFT hemiplegia. -Initial head CT 6/12 negative for acute process. - MRI brain Wo contrast pending.  To be completed at select LT[facility]  Severe protein calorie malnutrition TF as orderedvia [place]  Anemia of critical illness? -No overt evidence of bleed - Anemia panel pending.  Work-up to be completed at [facility] -Occult blood pending.  Work-up to be completed LTAC  Hyperglycemia -Moderate SSI       Discharge Diagnoses:  Principal Problem:   Mesenteric ischemia ([place]) Active Problems:   Essential hypertension   Arterial atherosclerosis   HLD (hyperlipidemia)   Abdominal pain   PUD (peptic ulcer disease)   Occlusion of superior mesenteric  artery (  [place])   Respiratory failure ([place])   Acute respiratory failure with hypoxemia (H[facility])   AKI (acute kidney injury) ([place])   Acute and chronic respiratory failure (acute-on-chronic) ([place])   Cardiac arrest ([place])   Elevated troponin     03/30/2018  Pulmonary/ 1st office eval/Luke Kaufman  Re sob with nl pfts on initial eval  Chief Complaint  Patient presents with  . Pulmonary Consult    Referred by [facility]. Pt c/o wheezing "for forever". He c/o DOE  for the past 2 months. He gets SOB walking short distances such as room to room at the nursing home.   Dyspnea:  Baseline using rescue several times a day proair and could walk up to a half a mile s stopping  Can walk around the NH several hundred feet s stopping s 02  Cough: thick white variable timing not noct or early am flare  Sleep: flat on back one pillow/sleeps on cpap  SABA use: saba and neb    No obvious day to day or daytime variability or assoc  purulent sputum or mucus plugs or hemoptysis or cp or chest tightness, subjective wheeze or overt sinus or hb symptoms.   Sleeping on cpap  without nocturnal  or early am exacerbation  of respiratory  c/o's or need for noct saba. Also denies any obvious fluctuation of symptoms with weather or environmental changes or other aggravating or alleviating factors except as outlined above   No unusual exposure hx or h/o childhood pna/ asthma or knowledge of premature birth.  Current Allergies, Complete Past Medical History, Past Surgical History, Family History, and Social History were reviewed in [employer] record.  ROS  The following are not active complaints unless bolded Hoarseness, sore throat, dysphagia, dental problems, itching, sneezing,  nasal congestion or discharge of excess mucus or purulent secretions, ear ache,   fever, chills, sweats, unintended wt loss or wt gain, classically pleuritic or exertional cp,  orthopnea pnd or arm/hand swelling   or leg swelling, presyncope, palpitations, abdominal pain, anorexia, nausea, vomiting, diarrhea  or change in bowel habits or change in bladder habits, change in stools or change in urine, dysuria, hematuria,  rash, arthralgias, visual complaints, headache, numbness, weakness or ataxia or problems with walking or coordination,  change in mood or  memory.           Past Medical History:  Diagnosis Date  . Acute metabolic encephalopathy 09/16/7822  . Gastric ulcer   . Hyperlipemia   . Hypertension   . MI (myocardial infarction) ([place])   . Pulmonary embolism and infarction ([place]) 10/14/2017  . Stroke ([place])   . Tobacco abuse     Outpatient Medications Prior to Visit  Medication Sig Dispense Refill  . acetaminophen (TYLENOL) 325 MG tablet Place 2 tablets (650 mg total) into feeding tube every 6 (six) hours. 10 tablet 0  . albuterol (PROAIR HFA) 108 (90 Base) MCG/ACT inhaler Inhale 2 puffs into the lungs every 6 (six) hours as needed for wheezing or shortness of breath.    [name] apixaban (ELIQUIS) 2.5 MG TABS tablet Take 2.5 mg by mouth 2 (two) times daily.    [name] aspirin 81 MG chewable tablet Place 1 tablet (81 mg total) into feeding tube daily. 10 tablet 0  . atorvastatin (LIPITOR) 80 MG tablet Place 1 tablet (80 mg total) into feeding tube daily at 6 PM. 30 tablet 0  . diphenoxylate-atropine (LOMOTIL) 2.5-0.025 MG tablet Take 1 tablet by mouth 4 (four)  times daily as needed for diarrhea or loose stools.    . ergocalciferol (VITAMIN D2) 1.25 MG (50000 UT) capsule Take 50,000 Units by mouth once a week.    . famotidine (PEPCID) 20 MG tablet Take 20 mg by mouth 2 (two) times daily.    . Fluticasone-Salmeterol (ADVAIR) 100-50 MCG/DOSE AEPB Inhale 1 puff into the lungs 2 (two) times daily.    [name] gabapentin (NEURONTIN) 100 MG capsule Take 100 mg by mouth 3 (three) times daily.    . metoprolol tartrate (LOPRESSOR) 25 MG tablet Place 1 tablet (25 mg total) into feeding tube 2 (two) times daily. 60 tablet 0  .  OXYGEN 2lpm o2 with sleep only    . sertraline (ZOLOFT) 50 MG tablet Take 50 mg by mouth daily.    . tamsulosin (FLOMAX) 0.4 MG CAPS capsule Take 0.4 mg by mouth daily.    [name] tiotropium (SPIRIVA) 18 MCG inhalation capsule Place 18 mcg into inhaler and inhale daily.    . Amino Acids-Protein Hydrolys (FEEDING SUPPLEMENT, PRO-STAT SUGAR FREE 64,) LIQD Place 60 mLs into feeding tube 2 (two) times daily. 900 mL 0  . famotidine (PEPCID) 40 MG/5ML suspension Place 2.5 mLs (20 mg total) into feeding tube 2 (two) times daily. 50 mL 0  . fentaNYL (SUBLIMAZE) 100 MCG/2ML injection Inject 1 mL (50 mcg total) into the vein every 2 (two) hours as needed for severe pain (breakthrough pain only). 2 mL 0  . glycopyrrolate (ROBINUL) 0.2 MG/ML injection Inject 1 mL (0.2 mg total) into the vein every 4 (four) hours as needed (heavy secretions). 4 mL 0  . heparin 100-0.45 UNIT/ML-% infusion Inject 2,600 Units/hr into the vein continuous. 250 mL 0  . HYDROmorphone (DILAUDID) 2 MG tablet Place 0.5 tablets (1 mg total) into feeding tube every 4 (four) hours. 12 tablet 0  . insulin aspart (NOVOLOG) 100 UNIT/ML injection Inject 0-15 Units into the skin every 4 (four) hours. 10 mL 0  . ipratropium-albuterol (DUONEB) 0.5-2.5 (3) MG/3ML SOLN Take 3 mLs by nebulization every 4 (four) hours as needed. 360 mL 0  . loperamide HCl (IMODIUM) 1 MG/7.5ML suspension Place 30 mLs (4 mg total) into feeding tube daily. 120 mL 0  . Nutritional Supplements (FEEDING SUPPLEMENT, OSMOLITE 1.2 CAL,) LIQD Place 1,000 mLs into feeding tube continuous. 1500 mL 0  . scopolamine (TRANSDERM-SCOP) 1 MG/3DAYS Place 1 patch (1.5 mg total) onto the skin every 3 (three) days. 10 patch 0  . Water For Irrigation, Sterile (FREE WATER) SOLN Place 200 mLs into feeding tube every 6 (six) hours. 1000 mL 0   No facility-administered medications prior to visit.      Objective:     BP 128/82 (BP Location: Left Arm, Cuff Size: Normal)   Pulse 82   Ht 5\' 8"   (1.727 m)   Wt 194 lb (88 kg)   SpO2 98%   BMI 29.50 kg/m   SpO2: 98 %  RA    W/c bound elderly wm mod hoarse/ classic insp stridor  HEENT: multiple missing teeth, nl  turbinates bilaterally, and oropharynx. Nl external ear canals without cough reflex   NECK :  without JVD/Nodes/TM/ nl carotid upstrokes bilaterally/ trach site looks fine   LUNGS: no acc muscle use,  Nl contour chest with loud transmitted upper airway noise on insp > exp    without cough on insp or exp maneuvers   CV:  RRR  no s3 or murmur or increase in P2, and no edema  ABD:  Multiple corset like bandages/ R ileostomy tube/  limited inspiratory excursion in the supine position. No bruits or organomegaly appreciated, bowel sounds nl  MS:  Nl gait/ ext warm without deformities, calf tenderness, cyanosis or clubbing No obvious joint restrictions   SKIN: warm and dry without lesions    NEURO:  alert, approp, nl sensorium with  no motor or cerebellar deficits apparent.     cxr 03/30/2018 ordered but not done today as not operational here, to be done at [facility].     Assessment   DOE (dyspnea on exertion) Spirometry 03/30/2018  FEV1 2.9 (95%)  Ratio 74 s curvature  p spiriva/ advair so rec d/c both and just use duoneb prn  - 03/30/2018   Walked RA  2 laps @ 210 ft each @ slow but steady pace s walker/  stopped due to  End of study  No evidence at all of copd here and if doesn't have it now s/p smoking cessation in May of 2019 it is unlikely he ever will and in fact some of his upper airway symptoms may be due to dpi's (see uacs) so ok to leave off and just use duoneb qid prn   He is cleared for surgery to revise ileostomy subject to a review cxr ordered today.    Advised pt:   I reviewed the Fletcher curve with the patient that basically indicates  if you quit smoking when your best day FEV1 is still well preserved (as is clearly  the case here)  it is highly unlikely you will progress to severe disease  and informed the patient there was  no medication on the market that has proven to alter the curve/ its downward trajectory  or the likelihood of progression of their disease(unlike other chronic medical conditions such as atheroclerosis where we do think we can change the natural hx with risk reducing meds)    Therefore stopping smoking and maintaining abstinence are  the most important aspects of his care, not choice of inhalers or for that matter, doctors.   Treatment other than smoking cessation  is entirely directed by severity of symptoms and focused also on reducing exacerbations, not attempting to change the natural history of the disease.   In absence of exacerbation or limiting sob from airflow obst, absent today as above, nothing to do here but treat flares of potential ab with saba/sama prn (duoneb or combivent)       Upper airway cough syndrome Trial off dpi's 03/30/2018  - refer to ENT 03/30/2018 due to concerns re subglottal stenosis contributing to cough or sob   Cough is day, not noct, and symptoms resolved on cpap but clearly present at rest today with almost stridorous breathing c/w Upper airway cough syndrome (previously labeled PNDS),  is so named because it's frequently impossible to sort out how much is  CR/sinusitis with freq throat clearing (which can be related to primary GERD)   vs  causing  secondary (" extra esophageal")  GERD from wide swings in gastric pressure that occur with throat clearing, often  promoting self use of mint and menthol lozenges that reduce the lower esophageal sphincter tone and exacerbate the problem further in a cyclical fashion.   These are the same pts (now being labeled as having "irritable larynx syndrome" by some cough centers) who not infrequently have a history of having failed to tolerate ace inhibitors,  dry powder inhalers(spiriva/ advair common causes and he is on both in dpi  form)  or biphosphonates or report having atypical/extraesophageal  reflux symptoms that don't respond to standard doses of PPI  and are easily confused as having aecopd or asthma flares by even experienced allergists/ pulmonologists (myself included).   >>> rec d/c advair/ spiriva and just use neb saba/saba  >>> refer to ENT as above      Total time devoted to counseling  > 50 % of initial 60 min office visit:  review case with pt/sister Judy/  directly observed walking 02 sat study/ discussion of options/alternatives/ personally creating written customized instructions  in presence of pt  then going over those specific  Instructions directly with the pt including how to use all of the meds but in particular covering each new medication in detail and the difference between the maintenance= "automatic" meds and the prns using an action plan format for the latter (If this problem/symptom => do that organization reading Left to right).  Please see AVS from this visit for a full list of these instructions which I personally wrote for this pt and  are unique to this visit.        Christinia Gully, MD 03/30/2018

## 2018-03-30 NOTE — Patient Instructions (Addendum)
Stop spiriva and advair inhalers   duoneb be used up to 4 x daily   Please see patient coordinator before you leave today  to schedule ent eval for your tracheostomy eval pre-op clearance   Please remember to go to the  x-ray department at the [facility] building (directly across from [facility])  @ [address]  in the basement  for your tests - we will call you with the results when they are available

## 2018-03-31 ENCOUNTER — Encounter: Payer: Self-pay | Admitting: Internal Medicine

## 2018-03-31 DIAGNOSIS — R058 Other specified cough: Secondary | ICD-10-CM | POA: Insufficient documentation

## 2018-03-31 DIAGNOSIS — R05 Cough: Secondary | ICD-10-CM | POA: Insufficient documentation

## 2018-03-31 NOTE — Assessment & Plan Note (Signed)
Spirometry 03/30/2018  FEV1 2.9 (95%)  Ratio 74 s curvature  p spiriva/ advair so rec d/c both and just use duoneb prn  - 03/30/2018   Walked RA  2 laps @ 210 ft each @ slow but steady pace s walker/  stopped due to  End of study  No evidence at all of copd here and if doesn't have it now s/p smoking cessation in May of 2019 it is unlikely he ever will and in fact some of his upper airway symptoms may be due to dpi's (see uacs) so ok to leave off and just use duoneb qid prn   He is cleared for surgery to revise ileostomy subject to a review cxr ordered today.    Advised pt:   I reviewed the Fletcher curve with the patient that basically indicates  if you quit smoking when your best day FEV1 is still well preserved (as is clearly  the case here)  it is highly unlikely you will progress to severe disease and informed the patient there was  no medication on the market that has proven to alter the curve/ its downward trajectory  or the likelihood of progression of their disease(unlike other chronic medical conditions such as atheroclerosis where we do think we can change the natural hx with risk reducing meds)    Therefore stopping smoking and maintaining abstinence are  the most important aspects of his care, not choice of inhalers or for that matter, doctors.   Treatment other than smoking cessation  is entirely directed by severity of symptoms and focused also on reducing exacerbations, not attempting to change the natural history of the disease.   In absence of exacerbation or limiting sob from airflow obst, absent today as above, nothing to do here but treat flares of potential ab with saba/sama prn (duoneb or combivent)

## 2018-03-31 NOTE — Assessment & Plan Note (Signed)
Trial off dpi's 03/30/2018  - refer to ENT 03/30/2018 due to concerns re subglottal stenosis contributing to cough or sob   Cough is day, not noct, and symptoms resolved on cpap but clearly present at rest today with almost stridorous breathing c/w Upper airway cough syndrome (previously labeled PNDS),  is so named because it's frequently impossible to sort out how much is  CR/sinusitis with freq throat clearing (which can be related to primary GERD)   vs  causing  secondary (" extra esophageal")  GERD from wide swings in gastric pressure that occur with throat clearing, often  promoting self use of mint and menthol lozenges that reduce the lower esophageal sphincter tone and exacerbate the problem further in a cyclical fashion.   These are the same pts (now being labeled as having "irritable larynx syndrome" by some cough centers) who not infrequently have a history of having failed to tolerate ace inhibitors,  dry powder inhalers(spiriva/ advair common causes and he is on both in dpi form)  or biphosphonates or report having atypical/extraesophageal reflux symptoms that don't respond to standard doses of PPI  and are easily confused as having aecopd or asthma flares by even experienced allergists/ pulmonologists (myself included).   >>> rec d/c advair/ spiriva and just use neb saba/saba  >>> refer to ENT as above      Total time devoted to counseling  > 50 % of initial 60 min office visit:  review case with pt/sister Judy/  directly observed walking 02 sat study/ discussion of options/alternatives/ personally creating written customized instructions  in presence of pt  then going over those specific  Instructions directly with the pt including how to use all of the meds but in particular covering each new medication in detail and the difference between the maintenance= "automatic" meds and the prns using an action plan format for the latter (If this problem/symptom => do that organization reading  Left to right).  Please see AVS from this visit for a full list of these instructions which I personally wrote for this pt and  are unique to this visit.

## 2018-04-02 DIAGNOSIS — L98499 Non-pressure chronic ulcer of skin of other sites with unspecified severity: Secondary | ICD-10-CM | POA: Diagnosis not present

## 2018-04-03 DIAGNOSIS — M6281 Muscle weakness (generalized): Secondary | ICD-10-CM | POA: Diagnosis not present

## 2018-04-03 DIAGNOSIS — Z5181 Encounter for therapeutic drug level monitoring: Secondary | ICD-10-CM | POA: Diagnosis not present

## 2018-04-03 DIAGNOSIS — J449 Chronic obstructive pulmonary disease, unspecified: Secondary | ICD-10-CM | POA: Diagnosis not present

## 2018-04-03 DIAGNOSIS — R5381 Other malaise: Secondary | ICD-10-CM | POA: Diagnosis not present

## 2018-04-03 DIAGNOSIS — F05 Delirium due to known physiological condition: Secondary | ICD-10-CM | POA: Diagnosis not present

## 2018-04-03 DIAGNOSIS — I10 Essential (primary) hypertension: Secondary | ICD-10-CM | POA: Diagnosis not present

## 2018-04-03 DIAGNOSIS — N390 Urinary tract infection, site not specified: Secondary | ICD-10-CM | POA: Diagnosis not present

## 2018-04-03 DIAGNOSIS — N19 Unspecified kidney failure: Secondary | ICD-10-CM | POA: Diagnosis not present

## 2018-04-07 DIAGNOSIS — M6281 Muscle weakness (generalized): Secondary | ICD-10-CM | POA: Diagnosis not present

## 2018-04-07 DIAGNOSIS — W19XXXA Unspecified fall, initial encounter: Secondary | ICD-10-CM | POA: Diagnosis not present

## 2018-04-07 DIAGNOSIS — L03116 Cellulitis of left lower limb: Secondary | ICD-10-CM | POA: Diagnosis not present

## 2018-04-07 DIAGNOSIS — G8104 Flaccid hemiplegia affecting left nondominant side: Secondary | ICD-10-CM | POA: Diagnosis not present

## 2018-04-08 DIAGNOSIS — N19 Unspecified kidney failure: Secondary | ICD-10-CM | POA: Diagnosis not present

## 2018-04-08 DIAGNOSIS — I10 Essential (primary) hypertension: Secondary | ICD-10-CM | POA: Diagnosis not present

## 2018-04-08 DIAGNOSIS — N390 Urinary tract infection, site not specified: Secondary | ICD-10-CM | POA: Diagnosis not present

## 2018-04-08 DIAGNOSIS — Z5181 Encounter for therapeutic drug level monitoring: Secondary | ICD-10-CM | POA: Diagnosis not present

## 2018-04-09 DIAGNOSIS — L98499 Non-pressure chronic ulcer of skin of other sites with unspecified severity: Secondary | ICD-10-CM | POA: Diagnosis not present

## 2018-04-09 DIAGNOSIS — L97819 Non-pressure chronic ulcer of other part of right lower leg with unspecified severity: Secondary | ICD-10-CM | POA: Diagnosis not present

## 2018-04-23 DIAGNOSIS — R45851 Suicidal ideations: Secondary | ICD-10-CM | POA: Diagnosis not present

## 2018-04-23 DIAGNOSIS — F332 Major depressive disorder, recurrent severe without psychotic features: Secondary | ICD-10-CM | POA: Diagnosis not present

## 2018-04-23 DIAGNOSIS — G8104 Flaccid hemiplegia affecting left nondominant side: Secondary | ICD-10-CM | POA: Diagnosis not present

## 2018-04-23 DIAGNOSIS — I6930 Unspecified sequelae of cerebral infarction: Secondary | ICD-10-CM | POA: Diagnosis not present

## 2018-04-23 DIAGNOSIS — L98499 Non-pressure chronic ulcer of skin of other sites with unspecified severity: Secondary | ICD-10-CM | POA: Diagnosis not present

## 2018-04-27 ENCOUNTER — Ambulatory Visit (INDEPENDENT_AMBULATORY_CARE_PROVIDER_SITE_OTHER)
Admission: RE | Admit: 2018-04-27 | Discharge: 2018-04-27 | Disposition: A | Discharge status: Home / Self Care | Payer: PPO | Source: Ambulatory Visit | Attending: Internal Medicine | Admitting: Internal Medicine

## 2018-04-27 DIAGNOSIS — R0609 Other forms of dyspnea: Secondary | ICD-10-CM

## 2018-04-27 DIAGNOSIS — F332 Major depressive disorder, recurrent severe without psychotic features: Secondary | ICD-10-CM | POA: Diagnosis not present

## 2018-04-27 DIAGNOSIS — R0602 Shortness of breath: Secondary | ICD-10-CM | POA: Diagnosis not present

## 2018-04-27 DIAGNOSIS — R300 Dysuria: Secondary | ICD-10-CM | POA: Diagnosis not present

## 2018-04-27 DIAGNOSIS — R339 Retention of urine, unspecified: Secondary | ICD-10-CM | POA: Diagnosis not present

## 2018-04-27 DIAGNOSIS — R3915 Urgency of urination: Secondary | ICD-10-CM | POA: Diagnosis not present

## 2018-04-28 DIAGNOSIS — N390 Urinary tract infection, site not specified: Secondary | ICD-10-CM | POA: Diagnosis not present

## 2018-04-28 DIAGNOSIS — N19 Unspecified kidney failure: Secondary | ICD-10-CM | POA: Diagnosis not present

## 2018-04-28 DIAGNOSIS — I6930 Unspecified sequelae of cerebral infarction: Secondary | ICD-10-CM | POA: Diagnosis not present

## 2018-04-28 DIAGNOSIS — R339 Retention of urine, unspecified: Secondary | ICD-10-CM | POA: Diagnosis not present

## 2018-04-28 DIAGNOSIS — F331 Major depressive disorder, recurrent, moderate: Secondary | ICD-10-CM | POA: Diagnosis not present

## 2018-04-28 DIAGNOSIS — Q541 Hypospadias, penile: Secondary | ICD-10-CM | POA: Diagnosis not present

## 2018-04-28 DIAGNOSIS — N399 Disorder of urinary system, unspecified: Secondary | ICD-10-CM | POA: Diagnosis not present

## 2018-04-28 NOTE — Progress Notes (Signed)
LMTCB

## 2018-04-29 DIAGNOSIS — Z139 Encounter for screening, unspecified: Secondary | ICD-10-CM | POA: Diagnosis not present

## 2018-04-29 DIAGNOSIS — D649 Anemia, unspecified: Secondary | ICD-10-CM | POA: Diagnosis not present

## 2018-04-29 DIAGNOSIS — N19 Unspecified kidney failure: Secondary | ICD-10-CM | POA: Diagnosis not present

## 2018-04-29 DIAGNOSIS — Z79899 Other long term (current) drug therapy: Secondary | ICD-10-CM | POA: Diagnosis not present

## 2018-04-29 DIAGNOSIS — N390 Urinary tract infection, site not specified: Secondary | ICD-10-CM | POA: Diagnosis not present

## 2018-04-29 DIAGNOSIS — I10 Essential (primary) hypertension: Secondary | ICD-10-CM | POA: Diagnosis not present

## 2018-04-30 DIAGNOSIS — N390 Urinary tract infection, site not specified: Secondary | ICD-10-CM | POA: Diagnosis not present

## 2018-04-30 DIAGNOSIS — R39198 Other difficulties with micturition: Secondary | ICD-10-CM | POA: Diagnosis not present

## 2018-04-30 DIAGNOSIS — L98499 Non-pressure chronic ulcer of skin of other sites with unspecified severity: Secondary | ICD-10-CM | POA: Diagnosis not present

## 2018-04-30 DIAGNOSIS — N399 Disorder of urinary system, unspecified: Secondary | ICD-10-CM | POA: Diagnosis not present

## 2018-04-30 DIAGNOSIS — R339 Retention of urine, unspecified: Secondary | ICD-10-CM | POA: Diagnosis not present

## 2018-04-30 DIAGNOSIS — N2889 Other specified disorders of kidney and ureter: Secondary | ICD-10-CM | POA: Diagnosis not present

## 2018-04-30 DIAGNOSIS — I6930 Unspecified sequelae of cerebral infarction: Secondary | ICD-10-CM | POA: Diagnosis not present

## 2018-04-30 NOTE — Progress Notes (Signed)
Spoke with pt and notified of results per Dr. Wert. Pt verbalized understanding and denied any questions. 

## 2018-05-01 DIAGNOSIS — F330 Major depressive disorder, recurrent, mild: Secondary | ICD-10-CM | POA: Diagnosis not present

## 2018-05-01 DIAGNOSIS — F419 Anxiety disorder, unspecified: Secondary | ICD-10-CM | POA: Diagnosis not present

## 2018-05-06 DIAGNOSIS — R3916 Straining to void: Secondary | ICD-10-CM | POA: Diagnosis not present

## 2018-05-06 DIAGNOSIS — T8130XA Disruption of wound, unspecified, initial encounter: Secondary | ICD-10-CM | POA: Diagnosis not present

## 2018-05-06 DIAGNOSIS — Q549 Hypospadias, unspecified: Secondary | ICD-10-CM | POA: Diagnosis not present

## 2018-05-06 DIAGNOSIS — Z432 Encounter for attention to ileostomy: Secondary | ICD-10-CM | POA: Diagnosis not present

## 2018-05-07 DIAGNOSIS — L98499 Non-pressure chronic ulcer of skin of other sites with unspecified severity: Secondary | ICD-10-CM | POA: Diagnosis not present

## 2018-05-08 DIAGNOSIS — F331 Major depressive disorder, recurrent, moderate: Secondary | ICD-10-CM | POA: Diagnosis not present

## 2018-05-14 DIAGNOSIS — L98499 Non-pressure chronic ulcer of skin of other sites with unspecified severity: Secondary | ICD-10-CM | POA: Diagnosis not present

## 2018-05-21 DIAGNOSIS — L98499 Non-pressure chronic ulcer of skin of other sites with unspecified severity: Secondary | ICD-10-CM | POA: Diagnosis not present

## 2018-05-22 DIAGNOSIS — F330 Major depressive disorder, recurrent, mild: Secondary | ICD-10-CM | POA: Diagnosis not present

## 2018-05-22 DIAGNOSIS — F419 Anxiety disorder, unspecified: Secondary | ICD-10-CM | POA: Diagnosis not present

## 2018-06-12 ENCOUNTER — Ambulatory Visit: Payer: PPO | Admitting: Family Medicine

## 2018-11-11 ENCOUNTER — Other Ambulatory Visit: Payer: Self-pay

## 2018-11-11 ENCOUNTER — Inpatient Hospital Stay (HOSPITAL_COMMUNITY)
Admission: EM | Admit: 2018-11-11 | Discharge: 2018-11-25 | DRG: 682 | Disposition: A | Discharge status: Skilled Nursing Facility | Payer: Medicare Other | Source: Skilled Nursing Facility | Attending: Internal Medicine | Admitting: Internal Medicine

## 2018-11-11 ENCOUNTER — Emergency Department (HOSPITAL_COMMUNITY): Payer: Medicare Other

## 2018-11-11 ENCOUNTER — Encounter (HOSPITAL_COMMUNITY): Payer: Self-pay | Admitting: *Deleted

## 2018-11-11 DIAGNOSIS — Z86711 Personal history of pulmonary embolism: Secondary | ICD-10-CM | POA: Diagnosis not present

## 2018-11-11 DIAGNOSIS — G9340 Encephalopathy, unspecified: Secondary | ICD-10-CM | POA: Diagnosis present

## 2018-11-11 DIAGNOSIS — Z933 Colostomy status: Secondary | ICD-10-CM | POA: Diagnosis not present

## 2018-11-11 DIAGNOSIS — I959 Hypotension, unspecified: Secondary | ICD-10-CM | POA: Diagnosis present

## 2018-11-11 DIAGNOSIS — R7989 Other specified abnormal findings of blood chemistry: Secondary | ICD-10-CM | POA: Diagnosis not present

## 2018-11-11 DIAGNOSIS — Z800 Family history of malignant neoplasm of digestive organs: Secondary | ICD-10-CM

## 2018-11-11 DIAGNOSIS — N179 Acute kidney failure, unspecified: Secondary | ICD-10-CM | POA: Diagnosis present

## 2018-11-11 DIAGNOSIS — J9611 Chronic respiratory failure with hypoxia: Secondary | ICD-10-CM | POA: Diagnosis present

## 2018-11-11 DIAGNOSIS — E860 Dehydration: Secondary | ICD-10-CM | POA: Diagnosis present

## 2018-11-11 DIAGNOSIS — Z8711 Personal history of peptic ulcer disease: Secondary | ICD-10-CM | POA: Diagnosis not present

## 2018-11-11 DIAGNOSIS — G9341 Metabolic encephalopathy: Secondary | ICD-10-CM | POA: Diagnosis present

## 2018-11-11 DIAGNOSIS — R001 Bradycardia, unspecified: Secondary | ICD-10-CM | POA: Diagnosis present

## 2018-11-11 DIAGNOSIS — Z7901 Long term (current) use of anticoagulants: Secondary | ICD-10-CM | POA: Diagnosis not present

## 2018-11-11 DIAGNOSIS — E876 Hypokalemia: Secondary | ICD-10-CM | POA: Diagnosis present

## 2018-11-11 DIAGNOSIS — E785 Hyperlipidemia, unspecified: Secondary | ICD-10-CM | POA: Diagnosis present

## 2018-11-11 DIAGNOSIS — R9431 Abnormal electrocardiogram [ECG] [EKG]: Secondary | ICD-10-CM | POA: Diagnosis not present

## 2018-11-11 DIAGNOSIS — I129 Hypertensive chronic kidney disease with stage 1 through stage 4 chronic kidney disease, or unspecified chronic kidney disease: Secondary | ICD-10-CM | POA: Diagnosis present

## 2018-11-11 DIAGNOSIS — J181 Lobar pneumonia, unspecified organism: Secondary | ICD-10-CM | POA: Diagnosis not present

## 2018-11-11 DIAGNOSIS — R778 Other specified abnormalities of plasma proteins: Secondary | ICD-10-CM | POA: Diagnosis present

## 2018-11-11 DIAGNOSIS — E872 Acidosis: Secondary | ICD-10-CM | POA: Diagnosis present

## 2018-11-11 DIAGNOSIS — Q549 Hypospadias, unspecified: Secondary | ICD-10-CM

## 2018-11-11 DIAGNOSIS — R6510 Systemic inflammatory response syndrome (SIRS) of non-infectious origin without acute organ dysfunction: Secondary | ICD-10-CM | POA: Diagnosis present

## 2018-11-11 DIAGNOSIS — Z8249 Family history of ischemic heart disease and other diseases of the circulatory system: Secondary | ICD-10-CM

## 2018-11-11 DIAGNOSIS — I10 Essential (primary) hypertension: Secondary | ICD-10-CM | POA: Diagnosis present

## 2018-11-11 DIAGNOSIS — M7989 Other specified soft tissue disorders: Secondary | ICD-10-CM | POA: Diagnosis not present

## 2018-11-11 DIAGNOSIS — Z9981 Dependence on supplemental oxygen: Secondary | ICD-10-CM

## 2018-11-11 DIAGNOSIS — I252 Old myocardial infarction: Secondary | ICD-10-CM | POA: Diagnosis not present

## 2018-11-11 DIAGNOSIS — Z79899 Other long term (current) drug therapy: Secondary | ICD-10-CM

## 2018-11-11 DIAGNOSIS — D631 Anemia in chronic kidney disease: Secondary | ICD-10-CM | POA: Diagnosis present

## 2018-11-11 DIAGNOSIS — Z20828 Contact with and (suspected) exposure to other viral communicable diseases: Secondary | ICD-10-CM | POA: Diagnosis present

## 2018-11-11 DIAGNOSIS — Z8674 Personal history of sudden cardiac arrest: Secondary | ICD-10-CM

## 2018-11-11 DIAGNOSIS — I69354 Hemiplegia and hemiparesis following cerebral infarction affecting left non-dominant side: Secondary | ICD-10-CM

## 2018-11-11 DIAGNOSIS — E7800 Pure hypercholesterolemia, unspecified: Secondary | ICD-10-CM

## 2018-11-11 DIAGNOSIS — N183 Chronic kidney disease, stage 3 unspecified: Secondary | ICD-10-CM | POA: Diagnosis present

## 2018-11-11 DIAGNOSIS — G8194 Hemiplegia, unspecified affecting left nondominant side: Secondary | ICD-10-CM | POA: Diagnosis present

## 2018-11-11 DIAGNOSIS — R5381 Other malaise: Secondary | ICD-10-CM | POA: Diagnosis present

## 2018-11-11 DIAGNOSIS — N170 Acute kidney failure with tubular necrosis: Principal | ICD-10-CM | POA: Diagnosis present

## 2018-11-11 DIAGNOSIS — Z87891 Personal history of nicotine dependence: Secondary | ICD-10-CM

## 2018-11-11 DIAGNOSIS — I159 Secondary hypertension, unspecified: Secondary | ICD-10-CM | POA: Diagnosis not present

## 2018-11-11 DIAGNOSIS — J189 Pneumonia, unspecified organism: Secondary | ICD-10-CM

## 2018-11-11 DIAGNOSIS — E869 Volume depletion, unspecified: Secondary | ICD-10-CM | POA: Diagnosis present

## 2018-11-11 DIAGNOSIS — J449 Chronic obstructive pulmonary disease, unspecified: Secondary | ICD-10-CM | POA: Diagnosis present

## 2018-11-11 HISTORY — DX: Hypospadias, unspecified: Q54.9

## 2018-11-11 LAB — CBC WITH DIFFERENTIAL/PLATELET
Abs Immature Granulocytes: 0.11 10*3/uL — ABNORMAL HIGH (ref 0.00–0.07)
Basophils Absolute: 0 10*3/uL (ref 0.0–0.1)
Basophils Relative: 0 %
Eosinophils Absolute: 0.1 10*3/uL (ref 0.0–0.5)
Eosinophils Relative: 1 %
HCT: 33.7 % — ABNORMAL LOW (ref 39.0–52.0)
Hemoglobin: 10.5 g/dL — ABNORMAL LOW (ref 13.0–17.0)
Immature Granulocytes: 1 %
Lymphocytes Relative: 6 %
Lymphs Abs: 0.6 10*3/uL — ABNORMAL LOW (ref 0.7–4.0)
MCH: 28.1 pg (ref 26.0–34.0)
MCHC: 31.2 g/dL (ref 30.0–36.0)
MCV: 90.1 fL (ref 80.0–100.0)
Monocytes Absolute: 0.5 10*3/uL (ref 0.1–1.0)
Monocytes Relative: 5 %
Neutro Abs: 8.5 10*3/uL — ABNORMAL HIGH (ref 1.7–7.7)
Neutrophils Relative %: 87 %
Platelets: 190 10*3/uL (ref 150–400)
RBC: 3.74 MIL/uL — ABNORMAL LOW (ref 4.22–5.81)
RDW: 14.7 % (ref 11.5–15.5)
WBC: 9.9 10*3/uL (ref 4.0–10.5)
nRBC: 0 % (ref 0.0–0.2)

## 2018-11-11 LAB — URINALYSIS, ROUTINE W REFLEX MICROSCOPIC
Bilirubin Urine: NEGATIVE
Glucose, UA: NEGATIVE mg/dL
Ketones, ur: NEGATIVE mg/dL
Leukocytes,Ua: NEGATIVE
Nitrite: NEGATIVE
Protein, ur: NEGATIVE mg/dL
Specific Gravity, Urine: 1.01 (ref 1.005–1.030)
pH: 5 (ref 5.0–8.0)

## 2018-11-11 LAB — COMPREHENSIVE METABOLIC PANEL
ALT: 15 U/L (ref 0–44)
AST: 18 U/L (ref 15–41)
Albumin: 3.1 g/dL — ABNORMAL LOW (ref 3.5–5.0)
Alkaline Phosphatase: 99 U/L (ref 38–126)
BUN: 89 mg/dL — ABNORMAL HIGH (ref 8–23)
CO2: <7 mmol/L — ABNORMAL LOW (ref 22–32)
Calcium: 8.2 mg/dL — ABNORMAL LOW (ref 8.9–10.3)
Chloride: 114 mmol/L — ABNORMAL HIGH (ref 98–111)
Creatinine, Ser: 9.44 mg/dL — ABNORMAL HIGH (ref 0.61–1.24)
GFR calc Af Amer: 6 mL/min — ABNORMAL LOW (ref >60)
GFR calc non Af Amer: 5 mL/min — ABNORMAL LOW (ref >60)
Glucose, Bld: 79 mg/dL (ref 70–99)
Potassium: 4.8 mmol/L (ref 3.5–5.1)
Sodium: 137 mmol/L (ref 135–145)
Total Bilirubin: 0.5 mg/dL (ref 0.3–1.2)
Total Protein: 6.5 g/dL (ref 6.5–8.1)

## 2018-11-11 LAB — SARS CORONAVIRUS 2 BY RT PCR (HOSPITAL ORDER, PERFORMED IN ~~LOC~~ HOSPITAL LAB): SARS Coronavirus 2: NEGATIVE

## 2018-11-11 LAB — SODIUM, URINE, RANDOM: Sodium, Ur: 14 mmol/L

## 2018-11-11 LAB — CK: Total CK: 782 U/L — ABNORMAL HIGH (ref 49–397)

## 2018-11-11 LAB — PROCALCITONIN: Procalcitonin: <0.1 ng/mL

## 2018-11-11 LAB — LACTIC ACID, PLASMA
Lactic Acid, Venous: 0.6 mmol/L (ref 0.5–1.9)
Lactic Acid, Venous: 0.5 mmol/L (ref 0.5–1.9)

## 2018-11-11 LAB — TROPONIN I (HIGH SENSITIVITY)
Troponin I (High Sensitivity): 1978 ng/L (ref <18)
Troponin I (High Sensitivity): 2015 ng/L (ref <18)

## 2018-11-11 LAB — CREATININE, URINE, RANDOM: Creatinine, Urine: 116.64 mg/dL

## 2018-11-11 MED ORDER — ACETAMINOPHEN 650 MG RE SUPP: 650.0000 mg | Freq: Four times a day (QID) | RECTAL | Status: DC | PRN

## 2018-11-11 MED ORDER — ONDANSETRON HCL 4 MG PO TABS: 4.0000 mg | ORAL_TABLET | Freq: Four times a day (QID) | ORAL | Status: DC | PRN

## 2018-11-11 MED ORDER — ATORVASTATIN CALCIUM 40 MG PO TABS
80.0000 mg | ORAL_TABLET | Freq: Every day | ORAL | Status: DC
  Administered 2018-11-12 – 2018-11-24 (×13): 80 mg via ORAL
  Filled 2018-11-11 (×13): qty 2

## 2018-11-11 MED ORDER — SODIUM CHLORIDE 0.9 % IV SOLN
2.0000 g | INTRAVENOUS | Status: AC
  Administered 2018-11-11: 2 g via INTRAVENOUS
  Filled 2018-11-11: qty 2

## 2018-11-11 MED ORDER — TAMSULOSIN HCL 0.4 MG PO CAPS
0.4000 mg | ORAL_CAPSULE | Freq: Every day | ORAL | Status: DC
  Administered 2018-11-13 – 2018-11-25 (×13): 0.4 mg via ORAL
  Filled 2018-11-11 (×14): qty 1

## 2018-11-11 MED ORDER — SERTRALINE HCL 100 MG PO TABS
100.0000 mg | ORAL_TABLET | Freq: Every day | ORAL | Status: DC
  Administered 2018-11-13 – 2018-11-25 (×13): 100 mg via ORAL
  Filled 2018-11-11 (×14): qty 1

## 2018-11-11 MED ORDER — GABAPENTIN 100 MG PO CAPS
100.0000 mg | ORAL_CAPSULE | Freq: Every day | ORAL | Status: DC
  Administered 2018-11-13 – 2018-11-25 (×13): 100 mg via ORAL
  Filled 2018-11-11 (×14): qty 1

## 2018-11-11 MED ORDER — METOPROLOL TARTRATE 25 MG PO TABS
12.5000 mg | ORAL_TABLET | Freq: Two times a day (BID) | ORAL | Status: DC
  Administered 2018-11-13: 12.5 mg via ORAL
  Filled 2018-11-11 (×2): qty 1

## 2018-11-11 MED ORDER — CHOLESTYRAMINE 4 G PO PACK
4.0000 g | PACK | Freq: Every day | ORAL | Status: DC
  Administered 2018-11-13 – 2018-11-19 (×7): 4 g via ORAL
  Filled 2018-11-11 (×9): qty 1

## 2018-11-11 MED ORDER — ALBUTEROL SULFATE (2.5 MG/3ML) 0.083% IN NEBU
2.5000 mg | INHALATION_SOLUTION | RESPIRATORY_TRACT | Status: DC | PRN
  Administered 2018-11-14 – 2018-11-23 (×2): 2.5 mg via RESPIRATORY_TRACT
  Filled 2018-11-11 (×2): qty 3

## 2018-11-11 MED ORDER — FOLIC ACID 1 MG PO TABS
0.5000 mg | ORAL_TABLET | Freq: Every day | ORAL | Status: DC
  Administered 2018-11-13 – 2018-11-25 (×13): 0.5 mg via ORAL
  Filled 2018-11-11 (×14): qty 1

## 2018-11-11 MED ORDER — FERROUS SULFATE 325 (65 FE) MG PO TABS
325.0000 mg | ORAL_TABLET | Freq: Every day | ORAL | Status: DC
  Administered 2018-11-13 – 2018-11-25 (×13): 325 mg via ORAL
  Filled 2018-11-11 (×14): qty 1

## 2018-11-11 MED ORDER — SODIUM BICARBONATE 8.4 % IV SOLN
INTRAVENOUS | Status: DC
  Administered 2018-11-11 – 2018-11-14 (×5): via INTRAVENOUS
  Filled 2018-11-11 (×8): qty 150

## 2018-11-11 MED ORDER — SODIUM CHLORIDE 0.9 % IV SOLN
500.0000 mg | Freq: Once | INTRAVENOUS | Status: AC
  Administered 2018-11-11: 500 mg via INTRAVENOUS
  Filled 2018-11-11: qty 500

## 2018-11-11 MED ORDER — SODIUM CHLORIDE 0.9 % IV BOLUS: 250.0000 mL | Freq: Once | INTRAVENOUS | Status: DC

## 2018-11-11 MED ORDER — DIPHENOXYLATE-ATROPINE 2.5-0.025 MG PO TABS
1.0000 | ORAL_TABLET | Freq: Four times a day (QID) | ORAL | Status: DC | PRN
  Administered 2018-11-21: 1 via ORAL
  Filled 2018-11-11: qty 1

## 2018-11-11 MED ORDER — HEPARIN SODIUM (PORCINE) 5000 UNIT/ML IJ SOLN
5000.0000 [IU] | Freq: Three times a day (TID) | INTRAMUSCULAR | Status: DC
  Administered 2018-11-12 (×2): 5000 [IU] via SUBCUTANEOUS
  Filled 2018-11-11 (×2): qty 1

## 2018-11-11 MED ORDER — ACETAMINOPHEN 325 MG PO TABS
650.0000 mg | ORAL_TABLET | Freq: Four times a day (QID) | ORAL | Status: DC | PRN
  Administered 2018-11-17: 650 mg via ORAL
  Filled 2018-11-11: qty 2

## 2018-11-11 MED ORDER — VANCOMYCIN HCL 10 G IV SOLR
1500.0000 mg | INTRAVENOUS | Status: AC
  Administered 2018-11-11: 1500 mg via INTRAVENOUS
  Filled 2018-11-11: qty 1500

## 2018-11-11 MED ORDER — MIRTAZAPINE 15 MG PO TABS
7.5000 mg | ORAL_TABLET | Freq: Every day | ORAL | Status: DC
  Administered 2018-11-11 – 2018-11-24 (×14): 7.5 mg via ORAL
  Filled 2018-11-11 (×14): qty 1

## 2018-11-11 MED ORDER — PANTOPRAZOLE SODIUM 40 MG PO TBEC
40.0000 mg | DELAYED_RELEASE_TABLET | Freq: Every day | ORAL | Status: DC
  Administered 2018-11-13 – 2018-11-25 (×13): 40 mg via ORAL
  Filled 2018-11-11 (×14): qty 1

## 2018-11-11 MED ORDER — SODIUM CHLORIDE 0.9 % IV BOLUS: 500.0000 mL | Freq: Once | INTRAVENOUS | Status: DC

## 2018-11-11 MED ORDER — SODIUM CHLORIDE 0.9 % IV BOLUS
1000.0000 mL | Freq: Once | INTRAVENOUS | Status: AC
  Administered 2018-11-11: 23:00:00 1000 mL via INTRAVENOUS

## 2018-11-11 MED ORDER — SODIUM CHLORIDE 0.9 % IV BOLUS
1000.0000 mL | Freq: Once | INTRAVENOUS | Status: AC
  Administered 2018-11-11: 13:00:00 1000 mL via INTRAVENOUS

## 2018-11-11 MED ORDER — ASPIRIN 81 MG PO CHEW
81.0000 mg | CHEWABLE_TABLET | Freq: Every day | ORAL | Status: DC
  Administered 2018-11-13 – 2018-11-25 (×13): 81 mg via ORAL
  Filled 2018-11-11 (×14): qty 1

## 2018-11-11 MED ORDER — ONDANSETRON HCL 4 MG/2ML IJ SOLN
4.0000 mg | Freq: Four times a day (QID) | INTRAMUSCULAR | Status: DC | PRN
  Administered 2018-11-11 – 2018-11-14 (×3): 4 mg via INTRAVENOUS
  Filled 2018-11-11 (×3): qty 2

## 2018-11-11 NOTE — ED Provider Notes (Addendum)
[facility] DEPT Provider Note   CSN: [id number] Arrival date & time: 11/11/18  1045     History   Chief Complaint Chief Complaint  Patient presents with  . Weakness    HPI Luke Kaufman is a 68 y.o. male.     HPI   Patient is a 68 year old male with a past medical history of MI, hyperlipidemia, hypertension, pulmonary embolism on Eliquis, CVA with residual left-sided deficits presenting for altered mental status and generalized weakness.  History limited from patient, but he does reported that over the past 3 days he has felt generally weak and has had difficulty even standing.  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  He reports that his left leg feels more weak than normal although his left arm is always weak.  Denies known fevers, chills, nausea, vomiting or diarrhea out of his ostomy.  Collateral information was obtained from primary RN at [facility].  According to RN, the patient has had 5 days of generalized weakness.  He used to be able to transfer from bed to chair and now he can barely stand.  He has seemed more confused than normal.  He likes to spend long periods of time outside and staff is concerned about dehydration so the nurse practitioner at the facility obtained CBC and BMP which showed that his creatinine was 9.47.  His last creatinine in March 2020 was 1.78.  He has not had any GI losses.  They deny any fever or other abnormal vital signs.  No recent COVID-19 infections inside their facility.  Past Medical History:  Diagnosis Date  . Acute metabolic encephalopathy 08/05/6061  . Gastric ulcer   . Hyperlipemia   . Hypertension   . MI (myocardial infarction) ([place])   . Pulmonary embolism and infarction ([place]) 10/14/2017  . Stroke ([place])   . Tobacco abuse     Patient Active Problem List   Diagnosis Date Noted  . Upper airway cough syndrome  03/31/2018  . DOE (dyspnea on exertion) 03/30/2018  . 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 Acute renal failure ([place]) 10/14/2017  . Pulmonary embolism and infarction ([place]) 10/14/2017  . Acute metabolic encephalopathy 01/60/1093  . Cardiac arrest ([place]) 10/14/2017  . Non-STEMI (non-ST elevated myocardial infarction) ([place])   . Pulmonary embolus, left ([place])   . Acute deep vein thrombosis (DVT) of distal end of left lower extremity ([place])   . Acute deep vein thrombosis (DVT) of radial vein of right upper extremity ([place])   . Left hemiparesis ([place])   . Severe protein-calorie malnutrition ([place])   . Acute encephalopathy   . Elevated troponin   . Acute on chronic respiratory failure with hypoxia ([place])   . Cardiac arrest ([place])   . AKI (acute kidney injury) ([place])   . Acute respiratory failure with hypoxemia ([place])   . Respiratory failure ([place])   . Abdominal pain 09/04/2017  . PUD (peptic ulcer disease) 09/04/2017  . Mesenteric ischemia ([place]) 09/04/2017  . 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 Left arm numbness 12/14/2015  . Numbness 12/14/2015  . Bilateral carotid artery stenosis 11/22/2015  . Recurrent major depressive disorder in partial remission ([place]) 10/19/2015  . Mitral valve prolapse 09/07/2015  . Vitamin D deficiency 08/31/2015  . Arterial atherosclerosis 04/10/2015  . Cataract 04/10/2015  . Cerebrovascular accident, old 04/10/2015  . Gonalgia 04/10/2015  .  HLD (hyperlipidemia) 04/10/2015  . 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 Renal artery stenosis ([place]) 04/10/2015  . History of non-ST elevation myocardial infarction (NSTEMI) 04/10/2015  . Anemia associated with acute blood loss 08/24/2014  . Thrombocytopenia ([place])   . Hypokalemia   . Essential hypertension 08/09/2014  . Chest pain 08/09/2014  . Near syncope 08/09/2014  . CKD (chronic kidney disease) stage 3, GFR 30-59 ml/min (HCC) 08/09/2014  . Tobacco abuse 08/09/2014  . Upper  GI bleeding   . Gastric ulcer with hemorrhage   . Acute blood loss anemia   . CVA (cerebral infarction) 05/16/2013  . HTN (hypertension) 05/16/2013  . 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    Past Surgical History:  Procedure Laterality Date  . APPLICATION OF WOUND VAC N/A 09/04/2017   Procedure: APPLICATION OF WOUND VAC and Exploration of Abdomen.;  Surgeon: Elam Dutch, MD;  Location: [facility];  Service: Vascular;  Laterality: N/A;  . APPLICATION OF WOUND VAC N/A 09/06/2017   Procedure: APPLICATION OF WOUND VAC;  Surgeon: Georganna Skeans, MD;  Location: [place];  Service: General;  Laterality: N/A;  . APPLICATION OF WOUND VAC N/A 09/08/2017   Procedure: APPLICATION OF WOUND VAC;  Surgeon: Judeth Horn, MD;  Location: [place];  Service: General;  Laterality: N/A;  . APPLICATION OF WOUND VAC N/A 09/12/2017   Procedure: APPLICATION OF WOUND VAC  (CHANGE);  Surgeon: Judeth Horn, MD;  Location: [place];  Service: General;  Laterality: N/A;  . APPLICATION OF WOUND VAC N/A 09/14/2017   Procedure: APPLICATION OF WOUND VAC;  Surgeon: Greer Pickerel, MD;  Location: [place];  Service: General;  Laterality: N/A;  . APPLICATION OF WOUND VAC N/A 09/23/2017   Procedure: APPLICATION OF NEGATIVE PRESSURE THERAPY;  Surgeon: Kieth Brightly Arta Bruce, MD;  Location: [place];  Service: General;  Laterality: N/A;  . BYPASS GRAFT AORTA TO AORTA  09/04/2017   Procedure: Aorta to Superior Mesinteric aorta bypass ultrason Left common Femoral/;  Surgeon: Elam Dutch, MD;  Location[facility];  Service: Vascular;;  . ESOPHAGOGASTRODUODENOSCOPY N/A 08/09/2014   Procedure: ESOPHAGOGASTRODUODENOSCOPY (EGD);  Surgeon: Jerene Bears, MD;  Location[facility];  Service: Gastroenterology;  Laterality: N/A;  . ESOPHAGOGASTRODUODENOSCOPY N/A 08/10/2014   Procedure: ESOPHAGOGASTRODUODENOSCOPY (EGD);  Surgeon: Jerene Bears, MD;  Location: [facility];  Service: Gastroenterology;  Laterality: N/A;  . ILEOSTOMY Right 09/06/2017   Procedure:  POSSIBLE ILEOSTOMY;  Surgeon: Georganna Skeans, MD;  Location: [facility];  Service: General;  Laterality: Right;  . INSERTION OF MESH N/A 09/23/2017   Procedure: INSERTION OF MESH;  Surgeon: Kinsinger, Arta Bruce, MD;  Location: [place];  Service: General;  Laterality: N/A;  . IR FLUORO GUIDE CV LINE RIGHT  10/04/2017  . IR US GUIDE VASC ACCESS RIGHT  10/04/2017  . LAPAROTOMY Right 09/04/2017   Procedure: EXPLORATORY LAPAROTOMY Right Colon Resection;  Surgeon: Elam Dutch, MD;  Location: [place];  Service: Vascular;  Laterality: Right;  . LAPAROTOMY N/A 09/06/2017   Procedure: EXPLORATORY LAPAROTOMY;  Surgeon: Georganna Skeans, MD;  Location: [place];  Service: General;  Laterality: N/A;  . LAPAROTOMY N/A 09/08/2017   Procedure: EXPLORATORY LAPAROTOMY, ABDOMINAL WASHOUT, PARTIAL CLOSURE OF ABDOMEN;  Surgeon: Judeth Horn, MD;  Location: [place];  Service: General;  Laterality: N/A;  . LAPAROTOMY N/A 09/10/2017   Procedure: EXPLORATORY LAPAROTOMY WITH POSSIBLE WOUND CLOSURE ABDOMEN;  Surgeon: Judeth Horn, MD;  Location: [place];  Service: General;  Laterality: N/A;  . LAPAROTOMY N/A 09/12/2017   Procedure: EXPLORATORY  LAPAROTOMY;  Surgeon: Judeth Horn, MD;  Location: [place];  Service: General;  Laterality: N/A;  . LAPAROTOMY N/A 09/14/2017   Procedure: EXPLORATORY LAPAROTOMY;  Surgeon: Greer Pickerel, MD;  Location: [place];  Service: General;  Laterality: N/A;  . LAPAROTOMY N/A 09/17/2017   Procedure: EXPLORATORY LAPAROTOMY AND PARTIAL CLOSURE OF ABDOMEN;  Surgeon: Judeth Horn, MD;  Location: [place];  Service: General;  Laterality: N/A;  . LAPAROTOMY N/A 09/19/2017   Procedure: EXPLORATORY LAPAROTOMY & DRESSING CHANGE;  Surgeon: Coralie Keens, MD;  Location: [place];  Service: General;  Laterality: N/A;  . LAPAROTOMY N/A 09/23/2017   Procedure: EXPLORATORY LAPAROTOMY, INCISIONAL HERNIA REPAIR WITH MESH INSERTION, PARTIAL CLOSURE OF SKIN;  Surgeon: Kieth Brightly, Arta Bruce, MD;  Location: [place];  Service: General;   Laterality: N/A;  . TRACHEOSTOMY TUBE PLACEMENT N/A 09/12/2017   Procedure: TRACHEOSTOMY;  Surgeon: Judeth Horn, MD;  Location: [place];  Service: General;  Laterality: N/A;  . VISCERAL ANGIOGRAM N/A 09/04/2017   Procedure: Non Selective MESENTERIC ANGIOGRAM,;  Surgeon: Elam Dutch, MD;  Location[facility] OR;  Service: Vascular;  Laterality: N/A;        Home Medications    Prior to Admission medications   Medication Sig Start Date End Date Taking? Authorizing Provider  acetaminophen (TYLENOL) 325 MG tablet Place 2 tablets (650 mg total) into feeding tube every 6 (six) hours. Patient taking differently: Take 650 mg by mouth every 6 (six) hours as needed for mild pain, fever or headache.  10/09/17  Yes Allie Bossier, MD  albuterol (PROAIR HFA) 108 (90 Base) MCG/ACT inhaler Inhale 2 puffs into the lungs every 6 (six) hours as needed for wheezing or shortness of breath.   Yes [provider]  apixaban (ELIQUIS) 2.5 MG TABS tablet Take 2.5 mg by mouth 2 (two) times daily.   Yes [provider]  ascorbic acid (VITAMIN C) 500 MG tablet Take 500 mg by mouth daily.   Yes [provider]  aspirin 81 MG chewable tablet Place 1 tablet (81 mg total) into feeding tube daily. Patient taking differently: Chew 81 mg by mouth daily.  10/09/17  Yes Allie Bossier, MD  atorvastatin (LIPITOR) 80 MG tablet Place 1 tablet (80 mg total) into feeding tube daily at 6 PM. Patient taking differently: Take 80 mg by mouth daily at 6 PM.  10/09/17  Yes Allie Bossier, MD  cholestyramine Lucrezia Starch) 4 g packet Take 4 g by mouth daily.   Yes [provider]  diphenoxylate-atropine (LOMOTIL) 2.5-0.025 MG tablet Take 1 tablet by mouth 4 (four) times daily as needed for diarrhea or loose stools.   Yes [provider]  ergocalciferol (VITAMIN D2) 1.25 MG (50000 UT) capsule Take 50,000 Units by mouth once a week. On thursdays   Yes [provider]  ferrous sulfate 325 (65 FE)  MG tablet Take 325 mg by mouth daily with breakfast.   Yes [provider]  folic acid (FOLVITE) 973 MCG tablet Take 400 mcg by mouth daily.   Yes [provider]  gabapentin (NEURONTIN) 100 MG capsule Take 100 mg by mouth daily.    Yes [provider]  metoprolol tartrate (LOPRESSOR) 25 MG tablet Place 1 tablet (25 mg total) into feeding tube 2 (two) times daily. Patient taking differently: Take 12.5 mg by mouth 2 (two) times daily.  10/09/17  Yes Allie Bossier, MD  mirtazapine (REMERON) 7.5 MG tablet Take 7.5 mg by mouth at bedtime. For appetite stimulation  Yes [provider]  omeprazole (PRILOSEC) 20 MG capsule Take 20 mg by mouth daily.   Yes [provider]  OXYGEN Place 2 L/min into the nose at bedtime. Nasal cannula   Yes [provider]  sertraline (ZOLOFT) 100 MG tablet Take 100 mg by mouth daily.    Yes [provider]  sodium hypochlorite (DAKIN'S 1/4 STRENGTH) 0.125 % SOLN Irrigate with 1 application as directed 2 (two) times daily as needed (wound care).   Yes [provider]  tamsulosin (FLOMAX) 0.4 MG CAPS capsule Take 0.4 mg by mouth daily.   Yes [provider]    Family History Family History  Problem Relation Age of Onset  . Pancreatic cancer Mother   . Heart attack Father 14    Social History Social History   Tobacco Use  . Smoking status: Former Smoker    Packs/day: 2.00    Years: 50.00    Pack years: 100.00    Types: Cigarettes    Quit date: 08/20/2017    Years since quitting: 1.2  . Smokeless tobacco: Never Used  Substance Use Topics  . Alcohol use: Yes    Alcohol/week: 0.0 standard drinks    Comment: occasional  . Drug use: No     Allergies   Patient has no known allergies.   Review of Systems Review of Systems  Constitutional: Negative for chills and fever.  HENT: Negative for congestion and rhinorrhea.   Respiratory: Negative for cough, shortness of breath and  wheezing.   Cardiovascular: Negative for leg swelling.  Gastrointestinal: Negative for abdominal pain, nausea and vomiting.  Neurological: Positive for weakness.       +Generalized weakness.   All other systems reviewed and are negative.    Physical Exam Updated Vital Signs BP (!) 134/59   Pulse 86   Temp (!) 96.3 F (35.7 C) (Rectal)   Resp 14   SpO2 96%   Physical Exam Vitals signs and nursing note reviewed.  Constitutional:      General: He is not in acute distress.    Appearance: He is well-developed.     Comments: Alert, oriented to person and place. Follows directions but needs frequent redirection.   HENT:     Head: Normocephalic and atraumatic.     Nose: No congestion or rhinorrhea.     Comments: O2 cannula in nares.     Mouth/Throat:     Mouth: Mucous membranes are dry.  Eyes:     Conjunctiva/sclera: Conjunctivae normal.     Pupils: Pupils are equal, round, and reactive to light.  Neck:     Musculoskeletal: Normal range of motion and neck supple.  Cardiovascular:     Rate and Rhythm: Normal rate and regular rhythm.     Heart sounds: S1 normal and S2 normal. No murmur.  Pulmonary:     Effort: Pulmonary effort is normal.     Breath sounds: Rales present. No wheezing.     Comments: Coarse lung sounds bilaterally. Abdominal:     General: There is no distension.     Palpations: Abdomen is soft.     Tenderness: There is no abdominal tenderness. There is no guarding.     Comments: Ostomy in place with stool output of normal caliber without blood or melena in the RLQ.   Musculoskeletal: Normal range of motion.        General: No deformity.  Lymphadenopathy:     Cervical: No cervical adenopathy.  Skin:  General: Skin is warm and dry.     Findings: No erythema or rash.  Neurological:     Mental Status: He is alert.     Comments: Mental Status:  Alert, oriented to person   Cranial Nerves:  II:  Peripheral visual fields grossly normal, pupils equal, round,  reactive to light III,IV, VI: ptosis not present, extra-ocular motions intact bilaterally  V,VII: smile symmetric, facial light touch sensation equal VIII: hearing grossly normal to voice  X: uvula elevates symmetrically  XI: bilateral shoulder shrug symmetric and strong XII: midline tongue extension without fassiculations Motor:  Normal tone. 4/5 in right upper and lower extremities  including strong and equal grip strength and dorsiflexion/plantar flexion; 3/5 in LLE Sensory: Light touch normal in all extremities.  Stance: No pronator drift on right. Only able to lift left arm against gravity. CV: distal pulses palpable throughout    Psychiatric:        Behavior: Behavior normal.        Thought Content: Thought content normal.        Judgment: Judgment normal.      ED Treatments / Results  Labs (all labs ordered are listed, but only abnormal results are displayed) Labs Reviewed  CBC WITH DIFFERENTIAL/PLATELET - Abnormal; Notable for the following components:      Result Value   RBC 3.74 (*)    Hemoglobin 10.5 (*)    HCT 33.7 (*)    Neutro Abs 8.5 (*)    Lymphs Abs 0.6 (*)    Abs Immature Granulocytes 0.11 (*)    All other components within normal limits  CULTURE, BLOOD (ROUTINE X 2)  CULTURE, BLOOD (ROUTINE X 2)  SARS CORONAVIRUS 2 (HOSPITAL ORDER, [place] [facility])  LACTIC ACID, PLASMA  COMPREHENSIVE METABOLIC PANEL  URINALYSIS, ROUTINE W REFLEX MICROSCOPIC  LACTIC ACID, PLASMA  SODIUM, URINE, RANDOM  CREATININE, URINE, RANDOM  TROPONIN I (HIGH SENSITIVITY)  TROPONIN I (HIGH SENSITIVITY)    EKG EKG Interpretation  Date/Time:  Wednesday November 11 2018 11:23:02 EDT Ventricular Rate:  89 PR Interval:    QRS Duration: 89 QT Interval:  371 QTC Calculation: 452 R Axis:   1 Text Interpretation:  Sinus rhythm Short PR interval Low voltage, precordial leads Consider anterior infarct No acute changes No significant change since last tracing  Confirmed by Varney Biles [phone]) on 11/11/2018 12:09:48 PM   Radiology Ct Head Wo Contrast  Result Date: 11/11/2018 CLINICAL DATA:  Altered level of consciousness EXAM: CT HEAD WITHOUT CONTRAST TECHNIQUE: Contiguous axial images were obtained from the base of the skull through the vertex without intravenous contrast. COMPARISON:  MRI 10/09/2017.  CT head 10/01/2017 FINDINGS: Brain: Chronic microvascular disease changes in the deep white matter, stable. No acute intracranial abnormality. Specifically, no hemorrhage, hydrocephalus, mass lesion, acute infarction, or significant intracranial injury. Vascular: No hyperdense vessel or unexpected calcification. Skull: No acute calvarial abnormality. Sinuses/Orbits: Visualized paranasal sinuses and mastoids clear. Orbital soft tissues unremarkable. 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 No acute intracranial abnormality. Electronically Signed   By: Rolm Baptise M.D.   On: 11/11/2018 12:52   Dg Pelvis Portable  Result Date: 11/11/2018 CLINICAL DATA:  Multiple falls. EXAM: PORTABLE PELVIS 1-2 VIEWS COMPARISON:  None. FINDINGS: There is no evidence of pelvic fracture or diastasis. No pelvic bone lesions are seen. IMPRESSION: Negative. Electronically Signed   By: Marijo Conception M.D.   On: 11/11/2018 13:48   Dg Chest Portable  1 View  Result Date: 11/11/2018 CLINICAL DATA:  Weakness for 3 days. EXAM: PORTABLE CHEST 1 VIEW COMPARISON:  04/27/2018 FINDINGS: Left base opacity, likely atelectasis or early infiltrate. Right lung clear. Heart is normal size. No effusions or acute bony abnormality. IMPRESSION: Left base atelectasis or early infiltrate. Electronically Signed   By: Rolm Baptise M.D.   On: 11/11/2018 12:53    Procedures Procedures (including critical care time)  CRITICAL CARE Performed by: Albesa Seen   Total critical care time: 35 minutes  Critical care time was exclusive of separately  billable procedures and treating other patients.  Critical care was necessary to treat or prevent imminent or life-threatening deterioration.  Critical care was time spent personally by me on the following activities: development of treatment plan with patient and/or surrogate as well as nursing, discussions with consultants, evaluation of patient's response to treatment, examination of patient, obtaining history from patient or surrogate, ordering and performing treatments and interventions, ordering and review of laboratory studies, ordering and review of radiographic studies, pulse oximetry and re-evaluation of patient's condition.   Medications Ordered in ED Medications  azithromycin (ZITHROMAX) 500 mg in sodium chloride 0.9 % 250 mL IVPB (has no administration in time range)  sodium chloride 0.9 % bolus 1,000 mL (has no administration in time range)  sodium bicarbonate 150 mEq in dextrose 5 % 1,000 mL infusion ( Intravenous New Bag/Given 11/11/18 1656)  sodium chloride 0.9 % bolus 1,000 mL (0 mLs Intravenous Stopped 11/11/18 1503)  ceFEPIme (MAXIPIME) 2 g in sodium chloride 0.9 % 100 mL IVPB (0 g Intravenous Stopped 11/11/18 1523)  vancomycin (VANCOCIN) 1,500 mg in sodium chloride 0.9 % 500 mL IVPB (1,500 mg Intravenous New Bag/Given 11/11/18 1526)     Initial Impression / Assessment and Plan / ED Course  I have reviewed the triage vital signs and the nursing notes.  Pertinent labs & imaging results that were available during my care of the patient were reviewed by me and considered in my medical decision making (see chart for details).  Clinical Course as of Nov 10 1805  Wed Nov 11, 2018  1323 Temp(!): 96.3 F (35.7 C) [AM]  1324 NEUT#(!): 8.5 [AM]  1325 Abs Immature Granulocytes(!): 0.11 [AM]  1403 Creatinine(!): 9.44 [AM]  1403 BUN(!): 89 [AM]  1417 Spoke with PharmD, Marylin Crosby, who recommends single dose vancomycin, cefepime, and azithro.    [AM]  1440 Spoke with Dr. Sloan Leiter who  will admit pt. Appreciate his involvement.    [AM]    Clinical Course User Index [AM] Albesa Seen, PA-C       This is a medically complex 68 year old male with a past medical history of essential hypertension, CVA, pulmonary embolism on Eliquis, and STEMI, major depressive disorder presenting for generalized weakness.  Differential diagnosis includes metabolic including electrolyte disturbance, acute renal failure, infectious etiology, sepsis, CVA.  Collateral information obtained from [facility] suggest that patient has acute renal failure, etiology unclear.  Possibly prerenal in origin given patient's long periods of time outside.  Work-up demonstrating stable hemoglobin at 10.5.  Creatinine is 9.44 and BUN 89.  CO2 less than 7.  Troponin is elevated at 1978.  EKG nonischemic.  CK 784.  Chest x-ray showing possible atelectasis versus early infiltrate in the left lower lobe.  CT head with chronic microvascular changes but no acute abnormalities.  Pelvis x-ray with no acute fractures.  Feel that patient's significantly elevated troponin is likely related to acute renal failure as he does  not have any chest pain, and has a nonischemic EKG.    Patient receiving normal saline fluid resuscitation, bicarb, broad-spectrum antibiotics to cover for community-acquired pneumonia.  Case was discussed with Amedeo Gory.D. who recommends that patient receive vancomycin, cefepime, and azithromycin given his history of MRSA colonization.  Patient to be admitted to [facility] for ongoing management.    This is a shared visit with Dr. Varney Biles. Patient was independently evaluated by this attending physician. Attending physician consulted in evaluation and  management.  Final Clinical Impressions(s) / ED Diagnoses   Final diagnoses:  Acute renal failure, unspecified acute renal failure type ([place])  Elevated troponin  Community acquired pneumonia of left lower lobe of lung [facility])     ED Discharge Orders    None       Tamala Julian 11/11/18 1812    Albesa Seen, PA-C 11/11/18 1815    Varney Biles, MD 11/15/18 1457

## 2018-11-11 NOTE — ED Notes (Signed)
Date and time results received: 11/11/18 1530 (use smartphrase ".now" to insert current time)  Test: Troponin Critical Value: 2,015  Name of Provider Notified: P.A. Alyssa  Orders Received? Or Actions Taken?:

## 2018-11-11 NOTE — ED Notes (Signed)
Gave pt blankets due to low rectal temp

## 2018-11-11 NOTE — Progress Notes (Signed)
A consult was received from an ED physician for Vancomycin and Cefepime per pharmacy dosing.  The patient's profile has been reviewed for ht/wt/allergies/indication/available labs.    Most recent weight documented = 88 kg (03/30/2018)  A one time order has been placed for Cefepime 2gm IV and Vancomycin 1500mg  IV.  Further antibiotics/pharmacy consults should be ordered by admitting physician if indicated.                       Thank you, Everette Rank, PharmD 11/11/2018  2:16 PM

## 2018-11-11 NOTE — ED Notes (Signed)
Patient aware of urine need

## 2018-11-11 NOTE — ED Notes (Signed)
Date and time results received: 11/11/18 1400 (use smartphrase ".now" to insert current time)  Test: Troponin Critical Value: 1,978  Name of Provider Notified: Antionette Poles.  Orders Received? Or Actions Taken?:

## 2018-11-11 NOTE — ED Notes (Signed)
Warm blankets placed on pt.  Multiple attempts to get a second set of blood cultures that were unsuccessful.

## 2018-11-11 NOTE — ED Triage Notes (Signed)
Pt bib EMS and coming from [facility]).  Pt presents with generalized weakness x three days.  Staff reports that pt sits outside for long periods of times of without proper hydration.  Staff gave pt 279ml IV fluid overnight. Pt has chronic edema in the left hand. Pt has a 20g in the left wrist placed by LPN at facility. Pt is a/o x 4.   Hx CVA, colostomy, MI, COPD, open surgical wound on chest

## 2018-11-11 NOTE — H&P (Signed)
History and Physical    Luke Kaufman LFY:[id number] DOB: Jan 13, 1951 DOA: 11/11/2018  PCP: Rosaria Ferries, MD  Patient coming from: SNF, long-term care.  I have personally briefly reviewed patient's old medical records available.  Patient has extensive hospitalizations last year that were reviewed.  Chief Complaint: Weakness.  Confusion.  HPI: Luke Kaufman is a 68 y.o. 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 fib, chronic left arm flaccid paralysis and long-term nursing home resident presenting to the emergency room with abnormal lab test, feeling weak for about a week.  Patient is poor historian.  Reviewed his medical records from previous hospitalization.  Also reviewed records from nursing home.  Patient himself tells that he stays out a lot so he might have gotten dehydrated.  He denies any nausea or vomiting.  He says his appetite is good.  No loose stool noted in colostomy.  Patient states that he is urinating but cannot tell me when was the last time he had any urine.  Denies any retention of urine or abdominal pain.  He does have chronic wound from abdominal wall, currently under wound care.  Patient says he does not smoke, however nursing home reported that he goes out in the sun to smoke and he stays outside long time and it was very hot outside.  Today morning, he was noted to be more and more weaker.  Usually he would transfer from bed to chair and walk with support and now he can barely walk or barely stand.  He also seem to be confused than normal.  So they did a lab test today where his BMP showed creatinine of 9.47, last creatinine in March 2020 was 1.78.  No reports of  fever.  No other resident would COVID-19 at the nursing home. ED Course: Blood pressures are adequate.  Temperature was slightly low normal.  Lactic acid was normal.  Repeat lab test shows normal WBC count.  Baseline hemoglobin.  BUN and creatinine 89/9.44. Blood cultures were obtained and patient was given first dose of broad-spectrum IV antibiotics for sepsis. Chest x-ray is not impressive, radiology read probable left upper lobe infiltrate or atelectasis. Patient does have subglottic stenosis and has noisy breathing, denies any cough or sputum production. High-sensitivity troponins 1900, EKG nonischemic.  Review of Systems: all systems are reviewed and pertinent positive as per HPI otherwise rest are negative.    Past Medical History:  Diagnosis Date  . Acute metabolic encephalopathy 8/52/7782  . Gastric ulcer   . Hyperlipemia   . Hypertension   . MI (myocardial infarction) ([place])   . Pulmonary embolism and infarction ([place]) 10/14/2017  . Stroke ([place])   . Tobacco abuse     Past Surgical History:  Procedure Laterality Date  . APPLICATION OF WOUND VAC N/A 09/04/2017   Procedure: APPLICATION OF WOUND VAC and Exploration of Abdomen.;  Surgeon: Elam Dutch, MD;  Location: [place];  Service: Vascular;  Laterality: N/A;  . APPLICATION OF WOUND VAC N/A 09/06/2017   Procedure: APPLICATION OF WOUND VAC;  Surgeon: Georganna Skeans, MD;  Location: [place];  Service: General;  Laterality: N/A;  . APPLICATION OF WOUND VAC N/A 09/08/2017   Procedure: APPLICATION OF WOUND VAC;  Surgeon: Judeth Horn, MD;  Location: [place];  Service: General;  Laterality:  N/A;  . APPLICATION OF WOUND VAC N/A 09/12/2017   Procedure: APPLICATION OF WOUND VAC  (CHANGE);  Surgeon: Judeth Horn, MD;  Location: [place];  Service: General;  Laterality: N/A;  . APPLICATION OF WOUND VAC N/A 09/14/2017   Procedure: APPLICATION OF WOUND VAC;  Surgeon: Greer Pickerel, MD;  Location: [place];  Service: General;  Laterality: N/A;  .  APPLICATION OF WOUND VAC N/A 09/23/2017   Procedure: APPLICATION OF NEGATIVE PRESSURE THERAPY;  Surgeon: Kieth Brightly Arta Bruce, MD;  Location: [place];  Service: General;  Laterality: N/A;  . BYPASS GRAFT AORTA TO AORTA  09/04/2017   Procedure: Aorta to Superior Mesinteric aorta bypass ultrason Left common Femoral/;  Surgeon: Elam Dutch, MD;  Location: [facility] OR;  Service: Vascular;;  . ESOPHAGOGASTRODUODENOSCOPY N/A 08/09/2014   Procedure: ESOPHAGOGASTRODUODENOSCOPY (EGD);  Surgeon: Jerene Bears, MD;  Location: [facility];  Service: Gastroenterology;  Laterality: N/A;  . ESOPHAGOGASTRODUODENOSCOPY N/A 08/10/2014   Procedure: ESOPHAGOGASTRODUODENOSCOPY (EGD);  Surgeon: Jerene Bears, MD;  Location: [facility];  Service: Gastroenterology;  Laterality: N/A;  . ILEOSTOMY Right 09/06/2017   Procedure: POSSIBLE ILEOSTOMY;  Surgeon: Georganna Skeans, MD;  Location: [place];  Service: General;  Laterality: Right;  . INSERTION OF MESH N/A 09/23/2017   Procedure: INSERTION OF MESH;  Surgeon: Kinsinger, Arta Bruce, MD;  Location: [place];  Service: General;  Laterality: N/A;  . IR FLUORO GUIDE CV LINE RIGHT  10/04/2017  . IR US GUIDE VASC ACCESS RIGHT  10/04/2017  . LAPAROTOMY Right 09/04/2017   Procedure: EXPLORATORY LAPAROTOMY Right Colon Resection;  Surgeon: Elam Dutch, MD;  Location: [place];  Service: Vascular;  Laterality: Right;  . LAPAROTOMY N/A 09/06/2017   Procedure: EXPLORATORY LAPAROTOMY;  Surgeon: Georganna Skeans, MD;  Location: [place];  Service: General;  Laterality: N/A;  . LAPAROTOMY N/A 09/08/2017   Procedure: EXPLORATORY LAPAROTOMY, ABDOMINAL WASHOUT, PARTIAL CLOSURE OF ABDOMEN;  Surgeon: Judeth Horn, MD;  Location: [place];  Service: General;  Laterality: N/A;  . LAPAROTOMY N/A 09/10/2017   Procedure: EXPLORATORY LAPAROTOMY WITH POSSIBLE WOUND CLOSURE ABDOMEN;  Surgeon: Judeth Horn, MD;  Location: [place];  Service: General;  Laterality: N/A;  . LAPAROTOMY N/A 09/12/2017   Procedure: EXPLORATORY  LAPAROTOMY;  Surgeon: Judeth Horn, MD;  Location: [place];  Service: General;  Laterality: N/A;  . LAPAROTOMY N/A 09/14/2017   Procedure: EXPLORATORY LAPAROTOMY;  Surgeon: Greer Pickerel, MD;  Location: [place];  Service: General;  Laterality: N/A;  . LAPAROTOMY N/A 09/17/2017   Procedure: EXPLORATORY LAPAROTOMY AND PARTIAL CLOSURE OF ABDOMEN;  Surgeon: Judeth Horn, MD;  Location: [place];  Service: General;  Laterality: N/A;  . LAPAROTOMY N/A 09/19/2017   Procedure: EXPLORATORY LAPAROTOMY & DRESSING CHANGE;  Surgeon: Coralie Keens, MD;  Location: [place];  Service: General;  Laterality: N/A;  . LAPAROTOMY N/A 09/23/2017   Procedure: EXPLORATORY LAPAROTOMY, INCISIONAL HERNIA REPAIR WITH MESH INSERTION, PARTIAL CLOSURE OF SKIN;  Surgeon: Kieth Brightly, Arta Bruce, MD;  Location: [place];  Service: General;  Laterality: N/A;  . TRACHEOSTOMY TUBE PLACEMENT N/A 09/12/2017   Procedure: TRACHEOSTOMY;  Surgeon: Judeth Horn, MD;  Location: [place];  Service: General;  Laterality: N/A;  . VISCERAL ANGIOGRAM N/A 09/04/2017   Procedure: Non Selective MESENTERIC ANGIOGRAM,;  Surgeon: Elam Dutch, MD;  Location: [place];  Service: Vascular;  Laterality: N/A;     reports that he quit smoking about 14 months ago. His smoking use included cigarettes. He has a 100.00 pack-year smoking history. He has never  used smokeless tobacco. He reports current alcohol use. He reports that he does not use drugs.  No Known Allergies  Family History  Problem Relation Age of Onset  . Pancreatic cancer Mother   . Heart attack Father 69     Prior to Admission medications   Medication Sig Start Date End Date Taking? Authorizing Provider  acetaminophen (TYLENOL) 325 MG tablet Place 2 tablets (650 mg total) into feeding tube every 6 (six) hours. Patient taking differently: Take 650 mg by mouth every 6 (six) hours as needed for mild pain, fever or headache.  10/09/17  Yes Allie Bossier, MD  albuterol (PROAIR HFA) 108 (90 Base) MCG/ACT  inhaler Inhale 2 puffs into the lungs every 6 (six) hours as needed for wheezing or shortness of breath.   Yes [provider]  apixaban (ELIQUIS) 2.5 MG TABS tablet Take 2.5 mg by mouth 2 (two) times daily.   Yes [provider]  ascorbic acid (VITAMIN C) 500 MG tablet Take 500 mg by mouth daily.   Yes [provider]  aspirin 81 MG chewable tablet Place 1 tablet (81 mg total) into feeding tube daily. Patient taking differently: Chew 81 mg by mouth daily.  10/09/17  Yes Allie Bossier, MD  atorvastatin (LIPITOR) 80 MG tablet Place 1 tablet (80 mg total) into feeding tube daily at 6 PM. Patient taking differently: Take 80 mg by mouth daily at 6 PM.  10/09/17  Yes Allie Bossier, MD  cholestyramine Lucrezia Starch) 4 g packet Take 4 g by mouth daily.   Yes [provider]  diphenoxylate-atropine (LOMOTIL) 2.5-0.025 MG tablet Take 1 tablet by mouth 4 (four) times daily as needed for diarrhea or loose stools.   Yes [provider]  ergocalciferol (VITAMIN D2) 1.25 MG (50000 UT) capsule Take 50,000 Units by mouth once a week. On thursdays   Yes [provider]  ferrous sulfate 325 (65 FE) MG tablet Take 325 mg by mouth daily with breakfast.   Yes [provider]  folic acid (FOLVITE) 017 MCG tablet Take 400 mcg by mouth daily.   Yes [provider]  gabapentin (NEURONTIN) 100 MG capsule Take 100 mg by mouth daily.    Yes [provider]  metoprolol tartrate (LOPRESSOR) 25 MG tablet Place 1 tablet (25 mg total) into feeding tube 2 (two) times daily. Patient taking differently: Take 12.5 mg by mouth 2 (two) times daily.  10/09/17  Yes Allie Bossier, MD  mirtazapine (REMERON) 7.5 MG tablet Take 7.5 mg by mouth at bedtime. For appetite stimulation   Yes [provider]  omeprazole (PRILOSEC) 20 MG capsule Take 20 mg by mouth daily.   Yes [provider]  OXYGEN Place 2 L/min into the nose at bedtime. Nasal cannula    Yes [provider]  sertraline (ZOLOFT) 100 MG tablet Take 100 mg by mouth daily.    Yes [provider]  sodium hypochlorite (DAKIN'S 1/4 STRENGTH) 0.125 % SOLN Irrigate with 1 application as directed 2 (two) times daily as needed (wound care).   Yes [provider]  tamsulosin (FLOMAX) 0.4 MG CAPS capsule Take 0.4 mg by mouth daily.   Yes [provider]    Physical Exam: Vitals:   11/11/18 1117 11/11/18 1314 11/11/18 1315 11/11/18 1445  BP: 131/64  (!) 134/59 (!) 127/57  Pulse: 95  86 86  Resp: 20  14 18   Temp: 97.8 F (36.6 C) (!) 96.3 F (35.7 C)  TempSrc: Oral Rectal    SpO2: 93%  96% 93%    Constitutional: NAD, calm, comfortable Vitals:   11/11/18 1117 11/11/18 1314 11/11/18 1315 11/11/18 1445  BP: 131/64  (!) 134/59 (!) 127/57  Pulse: 95  86 86  Resp: 20  14 18   Temp: 97.8 F (36.6 C) (!) 96.3 F (35.7 C)    TempSrc: Oral Rectal    SpO2: 93%  96% 93%   Eyes: PERRL, lids and conjunctivae normal, comfortable on currently 2 L oxygen. ENMT: Mucous membranes are dry . Posterior pharynx clear of any exudate or lesions.Normal dentition.  Neck: normal, supple, no masses, no thyromegaly. Respiratory: clear to auscultation bilaterally, mostly conducted upper airway sound and is snoring.  Normal respiratory effort. No accessory muscle use.  Cardiovascular: Regular rate and rhythm, no murmurs / rubs / gallops.  Lower extremities no edema.  Left upper extremity 2+ edema that is chronic.  2+ pedal pulses. No carotid bruits.  Abdomen: no tenderness, no masses palpated. No hepatosplenomegaly. Bowel sounds positive.  Large midline open abdominal wound with dry dressing, no obvious evidence of infection or intra-abdominal connection. Right lower quadrant ileostomy with formed stool, no evidence of blood or loose stool. Musculoskeletal: no clubbing / cyanosis. No joint deformity upper and lower extremities. Good ROM, no contractures. Normal muscle  tone.  Skin: no rashes, lesions, ulcers. No induration, abdominal wall ulcer as above. Neurologic: CN 2-12 grossly intact.  Left upper extremity, 1/5.  Right upper extremity, right and left lower extremity 4/5. Psychiatric: Normal judgment and insight. Alert and oriented x 3.  Flat affect.    Labs on Admission: I have personally reviewed following labs and imaging studies  CBC: Recent Labs  Lab 11/11/18 1304  WBC 9.9  NEUTROABS 8.5*  HGB 10.5*  HCT 33.7*  MCV 90.1  PLT 536   Basic Metabolic Panel: Recent Labs  Lab 11/11/18 1304  NA 137  K 4.8  CL 114*  CO2 <7*  GLUCOSE 79  BUN 89*  CREATININE 9.44*  CALCIUM 8.2*   GFR: CrCl cannot be calculated (Unknown ideal weight.). Liver Function Tests: Recent Labs  Lab 11/11/18 1304  AST 18  ALT 15  ALKPHOS 99  BILITOT 0.5  PROT 6.5  ALBUMIN 3.1*   No results for input(s): LIPASE, AMYLASE in the last 168 hours. No results for input(s): AMMONIA in the last 168 hours. Coagulation Profile: No results for input(s): INR, PROTIME in the last 168 hours. Cardiac Enzymes: Recent Labs  Lab 11/11/18 1301  CKTOTAL 782*   BNP (last 3 results) No results for input(s): PROBNP in the last 8760 hours. HbA1C: No results for input(s): HGBA1C in the last 72 hours. CBG: No results for input(s): GLUCAP in the last 168 hours. Lipid Profile: No results for input(s): CHOL, HDL, LDLCALC, TRIG, CHOLHDL, LDLDIRECT in the last 72 hours. Thyroid Function Tests: No results for input(s): TSH, T4TOTAL, FREET4, T3FREE, THYROIDAB in the last 72 hours. Anemia Panel: No results for input(s): VITAMINB12, FOLATE, FERRITIN, TIBC, IRON, RETICCTPCT in the last 72 hours. Urine analysis:    Component Value Date/Time   COLORURINE YELLOW 09/03/2017 2211   APPEARANCEUR CLEAR 09/03/2017 2211   LABSPEC 1.032 (H) 09/03/2017 2211   PHURINE 5.0 09/03/2017 2211   GLUCOSEU NEGATIVE 09/03/2017 2211   HGBUR NEGATIVE 09/03/2017 2211   BILIRUBINUR NEGATIVE  09/03/2017 2211   KETONESUR NEGATIVE 09/03/2017 2211   PROTEINUR NEGATIVE 09/03/2017 2211   UROBILINOGEN 0.2 05/17/2013 0307   NITRITE NEGATIVE 09/03/2017 2211  LEUKOCYTESUR NEGATIVE 09/03/2017 2211    Radiological Exams on Admission: Ct Head Wo Contrast  Result Date: 11/11/2018 CLINICAL DATA:  Altered level of consciousness EXAM: CT HEAD WITHOUT CONTRAST TECHNIQUE: Contiguous axial images were obtained from the base of the skull through the vertex without intravenous contrast. COMPARISON:  MRI 10/09/2017.  CT head 10/01/2017 FINDINGS: Brain: Chronic microvascular disease changes in the deep white matter, stable. No acute intracranial abnormality. Specifically, no hemorrhage, hydrocephalus, mass lesion, acute infarction, or significant intracranial injury. Vascular: No hyperdense vessel or unexpected calcification. Skull: No acute calvarial abnormality. Sinuses/Orbits: Visualized paranasal sinuses and mastoids clear. Orbital soft tissues unremarkable. 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 No acute intracranial abnormality. Electronically Signed   By: Rolm Baptise M.D.   On: 11/11/2018 12:52   Dg Pelvis Portable  Result Date: 11/11/2018 CLINICAL DATA:  Multiple falls. EXAM: PORTABLE PELVIS 1-2 VIEWS COMPARISON:  None. FINDINGS: There is no evidence of pelvic fracture or diastasis. No pelvic bone lesions are seen. IMPRESSION: Negative. Electronically Signed   By: Marijo Conception M.D.   On: 11/11/2018 13:48   Dg Chest Portable 1 View  Result Date: 11/11/2018 CLINICAL DATA:  Weakness for 3 days. EXAM: PORTABLE CHEST 1 VIEW COMPARISON:  04/27/2018 FINDINGS: Left base opacity, likely atelectasis or early infiltrate. Right lung clear. Heart is normal size. No effusions or acute bony abnormality. IMPRESSION: Left base atelectasis or early infiltrate. Electronically Signed   By: Rolm Baptise M.D.   On: 11/11/2018 12:53    EKG: Independently reviewed.   Low voltage, shaky artifact with no acute ST-T wave changes.  Comparable to previous EKGs.  Assessment/Plan Principal Problem:   Acute renal failure (HCC) Active Problems:   HTN (hypertension)   CKD (chronic kidney disease) stage 3, GFR 30-59 ml/min (HCC)   HLD (hyperlipidemia)   Elevated troponin   Left hemiparesis (HCC)   Acute encephalopathy   Acute kidney injury (AKI) with acute tubular necrosis (ATN) ([place])     1.  Acute renal failure with probable underlying chronic kidney disease stage III: Admit to monitored unit given severity of symptoms.  Exact cause unknown.  Suspect prerenal azotemia. Admit, telemetry, 2 L normal saline bolus followed by bicarbonate 150 mEq and 1 L, 163mL/h. Insert Foley catheter for strict output monitoring. Urinalysis and culture.  Urine electrolytes. Uric acid level.  Renal ultrasound to look for any hydronephrosis or obstruction. Discussed with nephrology, they will see patient in follow-up.  Potassium is normal.  No indication for urgent hemodialysis. Do not anticipate hemodialysis, however will hold Eliquis tonight in case he needs a tunnel catheter placement.  2.  Elevated troponins: With significant acute kidney injury.  Denies any chest pain.  EKG is nonischemic.  We will continue to follow-up EKG, will order an echocardiogram in the morning.  Will also order serial troponins to ensure stabilization. Patient is already on aspirin that he will continue.  He is on a statin and beta-blockers that he will continue.  3.  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  CT head is normal.  Continue to monitor.  Will work with PT OT once clinically stabilizes.  4.  Hypertension: Blood pressures are fairly stable.  He will continue beta-blockers with holding parameters.  5.  Acute metabolic encephalopathy: Suspect due to #1.  Will need continuous monitoring and treatment for the cause.  6.  SIRS due to noninfectious cause: Blood  pressures are stable.  Lactic acid  was normal.  No localizing infection on clinical examination.  Patient is a stabilizing.  He was given loading dose of broad-spectrum antibiotics in the emergency room.  Given severe renal failure and alternative explanation of the disease, will hold further antibiotics.  Will check procalcitonin, urine and blood cultures and monitor.  7.  History of PE: On Eliquis.  Received a dose last night at nursing home.  Will hold dose tonight in anticipation of any procedure.  If no procedures planned, need to resume tomorrow.  DVT prophylaxis: Heparin subcu Code Status: Full code Family Communication: sister called, could not connect, no identifiable voice mail.    Disposition Plan: Long-term care after hospitalization Consults called: Nephrology Admission status: Inpatient telemetry   Barb Merino MD [facility] Pager [phone]  If 7PM-7AM, please contact night-coverage [email or web address] Password [facility]  11/11/2018, 3:27 PM

## 2018-11-12 ENCOUNTER — Inpatient Hospital Stay (HOSPITAL_COMMUNITY): Payer: Medicare Other

## 2018-11-12 DIAGNOSIS — N183 Chronic kidney disease, stage 3 (moderate): Secondary | ICD-10-CM

## 2018-11-12 DIAGNOSIS — R9431 Abnormal electrocardiogram [ECG] [EKG]: Secondary | ICD-10-CM

## 2018-11-12 DIAGNOSIS — I159 Secondary hypertension, unspecified: Secondary | ICD-10-CM

## 2018-11-12 LAB — CBC
HCT: 30.1 % — ABNORMAL LOW (ref 39.0–52.0)
Hemoglobin: 9.5 g/dL — ABNORMAL LOW (ref 13.0–17.0)
MCH: 27.5 pg (ref 26.0–34.0)
MCHC: 31.6 g/dL (ref 30.0–36.0)
MCV: 87.2 fL (ref 80.0–100.0)
Platelets: 169 10*3/uL (ref 150–400)
RBC: 3.45 MIL/uL — ABNORMAL LOW (ref 4.22–5.81)
RDW: 14.6 % (ref 11.5–15.5)
WBC: 8.3 10*3/uL (ref 4.0–10.5)
nRBC: 0 % (ref 0.0–0.2)

## 2018-11-12 LAB — URIC ACID: Uric Acid, Serum: 7.7 mg/dL (ref 3.7–8.6)

## 2018-11-12 LAB — PROCALCITONIN: Procalcitonin: <0.1 ng/mL

## 2018-11-12 LAB — ECHOCARDIOGRAM COMPLETE
Height: 67 in
Weight: 3033.53 oz

## 2018-11-12 LAB — COMPREHENSIVE METABOLIC PANEL
ALT: 13 U/L (ref 0–44)
AST: 22 U/L (ref 15–41)
Albumin: 2.7 g/dL — ABNORMAL LOW (ref 3.5–5.0)
Alkaline Phosphatase: 83 U/L (ref 38–126)
Anion gap: 15 (ref 5–15)
BUN: 84 mg/dL — ABNORMAL HIGH (ref 8–23)
CO2: 10 mmol/L — ABNORMAL LOW (ref 22–32)
Calcium: 7.4 mg/dL — ABNORMAL LOW (ref 8.9–10.3)
Chloride: 116 mmol/L — ABNORMAL HIGH (ref 98–111)
Creatinine, Ser: 8.47 mg/dL — ABNORMAL HIGH (ref 0.61–1.24)
GFR calc Af Amer: 7 mL/min — ABNORMAL LOW (ref >60)
GFR calc non Af Amer: 6 mL/min — ABNORMAL LOW (ref >60)
Glucose, Bld: 91 mg/dL (ref 70–99)
Potassium: 4 mmol/L (ref 3.5–5.1)
Sodium: 141 mmol/L (ref 135–145)
Total Bilirubin: 0.5 mg/dL (ref 0.3–1.2)
Total Protein: 5.7 g/dL — ABNORMAL LOW (ref 6.5–8.1)

## 2018-11-12 LAB — TSH: TSH: 1.485 u[IU]/mL (ref 0.350–4.500)

## 2018-11-12 LAB — URINE CULTURE: Culture: NO GROWTH

## 2018-11-12 LAB — HIV ANTIBODY (ROUTINE TESTING W REFLEX): HIV Screen 4th Generation wRfx: NONREACTIVE

## 2018-11-12 LAB — MRSA PCR SCREENING: MRSA by PCR: NEGATIVE

## 2018-11-12 MED ORDER — ORAL CARE MOUTH RINSE
15.0000 mL | Freq: Two times a day (BID) | OROMUCOSAL | Status: DC
  Administered 2018-11-12 – 2018-11-24 (×23): 15 mL via OROMUCOSAL

## 2018-11-12 MED ORDER — APIXABAN 2.5 MG PO TABS
2.5000 mg | ORAL_TABLET | Freq: Two times a day (BID) | ORAL | Status: DC
  Administered 2018-11-12 – 2018-11-25 (×26): 2.5 mg via ORAL
  Filled 2018-11-12 (×26): qty 1

## 2018-11-12 MED ORDER — SODIUM CHLORIDE 0.9 % IV BOLUS
1000.0000 mL | Freq: Once | INTRAVENOUS | Status: AC
  Administered 2018-11-12: 1000 mL via INTRAVENOUS

## 2018-11-12 NOTE — Progress Notes (Signed)
Pt care assumed at 1600 this shift. I have reviewed the previous RN's assessment and agree with her findings. Pt is resting comfortably in their room in no pain or acute distress. Will continue to monitor

## 2018-11-12 NOTE — Consult Note (Signed)
Renal Service Consult Note [facility]  Kimberly Coye 11/12/2018 Sol Blazing Requesting Physician:  Dr Marylyn Ishihara  Reason for Consult:  Renal failure HPI: The patient is a 68 y.o. 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  Poor historian. Pt sent from SNF to ED last night for gen'd weakness x 3 days, hx of sitting outside w/o drinking enough for the SNF.  In ED creat was 9.4 and pt was admitted.  Overnight made 650 cc urine and today creat is 8.4. Asked to see for renal failure.   Pt is a bit lethargic, but mostly oriented. Has no specific c/o's.  He has a foley cath in .   BP's yesterday and overnight range from normal to very low for about 6 hrs last night between 5:30 pm and 11:45 pm.     ROS  denies CP  no joint pain   no HA  no blurry vision  no rash  no diarrhea  no nausea/ vomiting   Past Medical History  Past Medical History:  Diagnosis Date  . Acute metabolic encephalopathy 0/12/2328  . Gastric ulcer   . Hyperlipemia   . Hypertension   . Hypospadias   . MI (myocardial infarction) ([place])   . Pulmonary embolism and infarction ([place]) 10/14/2017  . Stroke ([place])   . Tobacco abuse    Past Surgical History  Past Surgical History:  Procedure Laterality Date  . APPLICATION OF WOUND VAC N/A 09/04/2017   Procedure: APPLICATION OF WOUND VAC and Exploration of Abdomen.;  Surgeon: Elam Dutch, MD;  Location: [place];  Service: Vascular;  Laterality: N/A;  . APPLICATION OF WOUND VAC N/A 09/06/2017   Procedure: APPLICATION OF WOUND VAC;  Surgeon: Georganna Skeans, MD;  Location: [place];  Service: General;  Laterality: N/A;  . APPLICATION OF WOUND VAC N/A 09/08/2017   Procedure: APPLICATION OF WOUND  VAC;  Surgeon: Judeth Horn, MD;  Location: [place];  Service: General;  Laterality: N/A;  . APPLICATION OF WOUND VAC N/A 09/12/2017   Procedure: APPLICATION OF WOUND VAC  (CHANGE);  Surgeon: Judeth Horn, MD;  Location: [place];  Service: General;  Laterality: N/A;  . APPLICATION OF WOUND VAC N/A 09/14/2017   Procedure: APPLICATION OF WOUND VAC;  Surgeon: Greer Pickerel, MD;  Location: [place];  Service: General;  Laterality: N/A;  . APPLICATION OF WOUND VAC N/A 09/23/2017   Procedure: APPLICATION OF NEGATIVE PRESSURE THERAPY;  Surgeon: Kieth Brightly Arta Bruce, MD;  Location: [place];  Service: General;  Laterality: N/A;  . BYPASS GRAFT AORTA TO AORTA  09/04/2017   Procedure: Aorta to Superior Mesinteric aorta bypass ultrason Left common Femoral/;  Surgeon: Elam Dutch, MD;  Location: [facility] OR;  Service: Vascular;;  . ESOPHAGOGASTRODUODENOSCOPY N/A 08/09/2014   Procedure: ESOPHAGOGASTRODUODENOSCOPY (EGD);  Surgeon: Jerene Bears, MD;  Location: [facility];  Service: Gastroenterology;  Laterality: N/A;  . ESOPHAGOGASTRODUODENOSCOPY N/A 08/10/2014   Procedure: ESOPHAGOGASTRODUODENOSCOPY (EGD);  Surgeon: Jerene Bears, MD;  Location: [facility];  Service: Gastroenterology;  Laterality: N/A;  . ILEOSTOMY Right 09/06/2017   Procedure: POSSIBLE ILEOSTOMY;  Surgeon: Georganna Skeans, MD;  Location: [place];  Service: General;  Laterality: Right;  . INSERTION OF MESH N/A 09/23/2017   Procedure: INSERTION OF MESH;  Surgeon:  Kinsinger, Arta Bruce, MD;  Location: [place];  Service: General;  Laterality: N/A;  . IR FLUORO GUIDE CV LINE RIGHT  10/04/2017  . IR US GUIDE VASC ACCESS RIGHT  10/04/2017  . LAPAROTOMY Right 09/04/2017   Procedure: EXPLORATORY LAPAROTOMY Right Colon Resection;  Surgeon: Elam Dutch, MD;  Location: [place];  Service: Vascular;  Laterality: Right;  . LAPAROTOMY N/A 09/06/2017   Procedure: EXPLORATORY LAPAROTOMY;  Surgeon: Georganna Skeans, MD;  Location: [place];  Service: General;  Laterality: N/A;  .  LAPAROTOMY N/A 09/08/2017   Procedure: EXPLORATORY LAPAROTOMY, ABDOMINAL WASHOUT, PARTIAL CLOSURE OF ABDOMEN;  Surgeon: Judeth Horn, MD;  Location: [place];  Service: General;  Laterality: N/A;  . LAPAROTOMY N/A 09/10/2017   Procedure: EXPLORATORY LAPAROTOMY WITH POSSIBLE WOUND CLOSURE ABDOMEN;  Surgeon: Judeth Horn, MD;  Location: [place];  Service: General;  Laterality: N/A;  . LAPAROTOMY N/A 09/12/2017   Procedure: EXPLORATORY LAPAROTOMY;  Surgeon: Judeth Horn, MD;  Location: [place];  Service: General;  Laterality: N/A;  . LAPAROTOMY N/A 09/14/2017   Procedure: EXPLORATORY LAPAROTOMY;  Surgeon: Greer Pickerel, MD;  Location: [place];  Service: General;  Laterality: N/A;  . LAPAROTOMY N/A 09/17/2017   Procedure: EXPLORATORY LAPAROTOMY AND PARTIAL CLOSURE OF ABDOMEN;  Surgeon: Judeth Horn, MD;  Location: [place];  Service: General;  Laterality: N/A;  . LAPAROTOMY N/A 09/19/2017   Procedure: EXPLORATORY LAPAROTOMY & DRESSING CHANGE;  Surgeon: Coralie Keens, MD;  Location: [place];  Service: General;  Laterality: N/A;  . LAPAROTOMY N/A 09/23/2017   Procedure: EXPLORATORY LAPAROTOMY, INCISIONAL HERNIA REPAIR WITH MESH INSERTION, PARTIAL CLOSURE OF SKIN;  Surgeon: Kieth Brightly, Arta Bruce, MD;  Location: [facility];  Service: General;  Laterality: N/A;  . TRACHEOSTOMY TUBE PLACEMENT N/A 09/12/2017   Procedure: TRACHEOSTOMY;  Surgeon: Judeth Horn, MD;  Location: [place];  Service: General;  Laterality: N/A;  . VISCERAL ANGIOGRAM N/A 09/04/2017   Procedure: Non Selective MESENTERIC ANGIOGRAM,;  Surgeon: Elam Dutch, MD;  Location: [facility] OR;  Service: Vascular;  Laterality: N/A;   Family History  Family History  Problem Relation Age of Onset  . Pancreatic cancer Mother   . Heart attack Father 75   Social History  reports that he quit smoking about 14 months ago. His smoking use included cigarettes. He has a 100.00 pack-year smoking history. He has never used smokeless tobacco. He reports current alcohol use. He  reports that he does not use drugs. Allergies No Known Allergies Home medications Prior to Admission medications   Medication Sig Start Date End Date Taking? Authorizing Provider  acetaminophen (TYLENOL) 325 MG tablet Place 2 tablets (650 mg total) into feeding tube every 6 (six) hours. Patient taking differently: Take 650 mg by mouth every 6 (six) hours as needed for mild pain, fever or headache.  10/09/17  Yes Allie Bossier, MD  albuterol (PROAIR HFA) 108 (90 Base) MCG/ACT inhaler Inhale 2 puffs into the lungs every 6 (six) hours as needed for wheezing or shortness of breath.   Yes [provider]  apixaban (ELIQUIS) 2.5 MG TABS tablet Take 2.5 mg by mouth 2 (two) times daily.   Yes [provider]  ascorbic acid (VITAMIN C) 500 MG tablet Take 500 mg by mouth daily.   Yes [provider]  aspirin 81 MG chewable tablet Place 1 tablet (81 mg total) into feeding tube daily. Patient taking differently: Chew 81 mg by mouth daily.  10/09/17  Yes Allie Bossier, MD  atorvastatin (LIPITOR) 80  MG tablet Place 1 tablet (80 mg total) into feeding tube daily at 6 PM. Patient taking differently: Take 80 mg by mouth daily at 6 PM.  10/09/17  Yes Allie Bossier, MD  cholestyramine Lucrezia Starch) 4 g packet Take 4 g by mouth daily.   Yes [provider]  diphenoxylate-atropine (LOMOTIL) 2.5-0.025 MG tablet Take 1 tablet by mouth 4 (four) times daily as needed for diarrhea or loose stools.   Yes [provider]  ergocalciferol (VITAMIN D2) 1.25 MG (50000 UT) capsule Take 50,000 Units by mouth once a week. On thursdays   Yes [provider]  ferrous sulfate 325 (65 FE) MG tablet Take 325 mg by mouth daily with breakfast.   Yes [provider]  folic acid (FOLVITE) 676 MCG tablet Take 400 mcg by mouth daily.   Yes [provider]  gabapentin (NEURONTIN) 100 MG capsule Take 100 mg by mouth daily.    Yes [provider]  metoprolol  tartrate (LOPRESSOR) 25 MG tablet Place 1 tablet (25 mg total) into feeding tube 2 (two) times daily. Patient taking differently: Take 12.5 mg by mouth 2 (two) times daily.  10/09/17  Yes Allie Bossier, MD  mirtazapine (REMERON) 7.5 MG tablet Take 7.5 mg by mouth at bedtime. For appetite stimulation   Yes [provider]  omeprazole (PRILOSEC) 20 MG capsule Take 20 mg by mouth daily.   Yes [provider]  OXYGEN Place 2 L/min into the nose at bedtime. Nasal cannula   Yes [provider]  sertraline (ZOLOFT) 100 MG tablet Take 100 mg by mouth daily.    Yes [provider]  sodium hypochlorite (DAKIN'S 1/4 STRENGTH) 0.125 % SOLN Irrigate with 1 application as directed 2 (two) times daily as needed (wound care).   Yes [provider]  tamsulosin (FLOMAX) 0.4 MG CAPS capsule Take 0.4 mg by mouth daily.   Yes [provider]   Liver Function Tests Recent Labs  Lab 11/11/18 1304 11/12/18 0510  AST 18 22  ALT 15 13  ALKPHOS 99 83  BILITOT 0.5 0.5  PROT 6.5 5.7*  ALBUMIN 3.1* 2.7*   No results for input(s): LIPASE, AMYLASE in the last 168 hours. CBC Recent Labs  Lab 11/11/18 1304 11/12/18 0510  WBC 9.9 8.3  NEUTROABS 8.5*  --   HGB 10.5* 9.5*  HCT 33.7* 30.1*  MCV 90.1 87.2  PLT 190 720   Basic Metabolic Panel Recent Labs  Lab 11/11/18 1304 11/12/18 0510  NA 137 141  K 4.8 4.0  CL 114* 116*  CO2 <7* 10*  GLUCOSE 79 91  BUN 89* 84*  CREATININE 9.44* 8.47*  CALCIUM 8.2* 7.4*   Iron/TIBC/Ferritin/ %Sat    Component Value Date/Time   IRON 11 (L) 10/09/2017 0500   TIBC 151 (L) 10/09/2017 0500   FERRITIN 519 (H) 10/09/2017 0500   IRONPCTSAT 7 (L) 10/09/2017 0500    Vitals:   11/12/18 0045 11/12/18 0130 11/12/18 0147 11/12/18 0302  BP: (!) 124/59 (!) 133/57  135/65  Pulse: 90 85  83  Resp: (!) 25 20  16   Temp:   (!) 97.5 F (36.4 C) 97.7 F (36.5 C)  TempSrc:   Oral Oral  SpO2: 97% 99%  99%  Weight:    86 kg   Height:    5\' 7"  (1.702 m)    Exam Gen chron ill appearing, lethargic but arouses and answers most quetsions No rash, cyanosis or gangrene Sclera anicteric, throat  is quite dry  No jvd or bruits Chest clear bilat to bases RRR no MRG Abd soft ntnd no mass or ascites, midline dressing of wound not removed, RLQ ostomy intact GU normal male w/ foley cath MS no joint effusions or deformity Ext trace pretib edema, no wounds or ulcers Neuro is awake, Ox 3 , L arm paresis    Home meds:  - apixaban 2.5 bid  - atorvastatin 80 qd/ aspirin 81/ cholestyramine 4g qd  - tamsulosin 0.4/ omeprazole 40 qd  - sertraline 100 qd/ mirtazapine 7.5 hs/ gabapentin 100 qd  - O2 2L [place]  - metoprolol 12.5 bid  - prn's/ vitamins/ supplements   CXR > IMPRESSION: Left base atelectasis or early infiltrate.  Renal US > 13cm R kidney, no hydro, +^echo.  6.8cm L kidney, cortical thinning and ^^echo, no hydro.   ECHO: LVEF 60%, MV regurg appears to have worsened but images are poor  UA 7.22 > few bact, 21-50 rbc and 6- 10 wbc, prot neg  UNa 14,  UCr 116    Assessment/ Plan: 1. AKI on CKD3 - baseline creat is 1.4.  One kidney is atrophic and prob not functional.  Hypotension/ vol depletion are most likely causes. Urine lytes support pre-renal picture. No acei/ ARB or nsaids. Will ^ IVF's slightly, but otherwise recommend only supportive care.  He likely would not do well on dialysis given his comorbidities so we will do every thing we can short of that to get renal fxn recovered.  Will follow.  2. Chron abd wound 3. Pyuria - cx pending 4. H/o CVA w/ left sided weakness 5. Chronic O2 nasal cannula 6. Chronic debility / SNF dependence      Kelly Splinter  MD 11/12/2018, 3:52 PM

## 2018-11-12 NOTE — Progress Notes (Signed)
Luke Kaufman Kitchen  PROGRESS NOTE    Luke Kaufman  ZOX:[id number] DOB: Dec 06, 1950 DOA: 11/11/2018 PCP: Rosaria Ferries, MD   Brief Narrative:   Luke Kaufman is a 68 y.o. 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 fib, chronic left arm flaccid paralysis and long-term nursing home resident presenting to the emergency room with abnormal lab test, feeling weak for about a week.  Patient is poor historian.  Reviewed his medical records from previous hospitalization.  Also reviewed records from nursing home.  Patient himself tells that he stays out a lot so he might have gotten dehydrated.  He denies any nausea or vomiting.  He says his appetite is good.  No loose stool noted in colostomy.  Patient states that he is urinating but cannot tell me when was the last time he had any urine.  Denies any retention of urine or abdominal pain.  He does have chronic wound from abdominal wall, currently under wound care.  Patient says he does not smoke, however nursing home reported that he goes out in the sun to smoke and he stays outside long time and it was very hot outside.  Today morning, he was noted to be more and more weaker.  Usually he would transfer from bed to chair and walk with support and now he can barely walk or barely stand.  He also seem to be confused than normal.  So they did a lab test today where his BMP showed creatinine of 9.47, last creatinine in March 2020 was 1.78.  No reports of fever.  No other resident would COVID-19 at the nursing home.   Assessment & Plan:   Principal Problem:   Acute renal failure (HCC) Active Problems:   HTN (hypertension)   CKD (chronic kidney disease) stage 3, GFR  30-59 ml/min (HCC)   HLD (hyperlipidemia)   Elevated troponin   Left hemiparesis (HCC)   Acute encephalopathy   Acute kidney injury (AKI) with acute tubular necrosis (ATN) (HCC)   Acute renal failure with probable underlying chronic kidney disease stage III:     - etiology unknown     - s/p fluid bolus and now on bicarb gtt; HCO3- up to 10 this morning     - Neprhology consulted, will see today     - foley in place     - SCr 9.4 at admission; down to 8.4     - continue bicarb for now; follow up UOP (600cc today)  Elevated troponins     - With significant acute kidney injury.       - Denies any chest pain/EKG is nonischemic.     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       - CT head is normal; continue to monitor.     - PT/OT prior to discharge  Hypertension     - continue metoprolol with holding parameters.  Acute metabolic encephalopathy     - Suspect due to #1; Will need continuous monitoring and treatment for the cause.  SIRS due to noninfectious cause:      - Blood pressures are stable.       -  Lactic acid was normal.       - No localizing infection on clinical examination.       - Patient is a stabilizing.       - He was given loading dose of broad-spectrum antibiotics in the emergency room.  Given severe renal failure and alternative explanation of the disease, will hold further antibiotics.     - procal is negative; lactic acid remains normal  History of PE:      - On Eliquis.       - will await nephro review/recs prior to resuming  Hx of midline abdominal wound     - WOCN; continue current care   DVT prophylaxis: Heparin Code Status: FULL   Disposition Plan: TBD   Consultants:   Nephrology   Subjective: "What?! Leave me alone."  Objective: Vitals:   11/12/18 0045 11/12/18 0130 11/12/18 0147 11/12/18 0302  BP: (!) 124/59 (!) 133/57   135/65  Pulse: 90 85  83  Resp: (!) 25 20  16   Temp:   (!) 97.5 F (36.4 C) 97.7 F (36.5 C)  TempSrc:   Oral Oral  SpO2: 97% 99%  99%  Weight:    86 kg  Height:    5\' 7"  (1.702 m)    Intake/Output Summary (Last 24 hours) at 11/12/2018 1603 Last data filed at 11/12/2018 1306 Gross per 24 hour  Intake 1885.1 ml  Output 770 ml  Net 1115.1 ml   Filed Weights   11/12/18 0302  Weight: 86 kg    Examination:  General: 68 y.o. male resting in bed in NAD Cardiovascular: RRR, +S1, S2, no m/g/r, equal pulses throughout Respiratory: CTABL, no w/r/r, normal WOB GI: BS+, NDNT, no masses noted, no organomegaly noted MSK: No e/c/c Skin: midline abdominal wound, chronic Neuro: Alert to name, following some commands, but wants to sleep   Data Reviewed: I have personally reviewed following labs and imaging studies.  CBC: Recent Labs  Lab 11/11/18 1304 11/12/18 0510  WBC 9.9 8.3  NEUTROABS 8.5*  --   HGB 10.5* 9.5*  HCT 33.7* 30.1*  MCV 90.1 87.2  PLT 190 528   Basic Metabolic Panel: Recent Labs  Lab 11/11/18 1304 11/12/18 0510  NA 137 141  K 4.8 4.0  CL 114* 116*  CO2 <7* 10*  GLUCOSE 79 91  BUN 89* 84*  CREATININE 9.44* 8.47*  CALCIUM 8.2* 7.4*   GFR: Estimated Creatinine Clearance: 8.7 mL/min (A) (by C-G formula based on SCr of 8.47 mg/dL (H)). Liver Function Tests: Recent Labs  Lab 11/11/18 1304 11/12/18 0510  AST 18 22  ALT 15 13  ALKPHOS 99 83  BILITOT 0.5 0.5  PROT 6.5 5.7*  ALBUMIN 3.1* 2.7*   No results for input(s): LIPASE, AMYLASE in the last 168 hours. No results for input(s): AMMONIA in the last 168 hours. Coagulation Profile: No results for input(s): INR, PROTIME in the last 168 hours. Cardiac Enzymes: Recent Labs  Lab 11/11/18 1301  CKTOTAL 782*   BNP (last 3 results) No results for input(s): PROBNP in the last 8760 hours. HbA1C: No results for input(s): HGBA1C in the last 72 hours. CBG: No results for input(s): GLUCAP in the last  168 hours. Lipid Profile: No results for input(s): CHOL, HDL, LDLCALC, TRIG, CHOLHDL, LDLDIRECT in the last 72 hours. Thyroid Function Tests: Recent Labs    11/12/18 0510  TSH 1.485   Anemia Panel: No results for input(s): VITAMINB12, FOLATE, FERRITIN, TIBC, IRON, RETICCTPCT in the last  72 hours. Sepsis Labs: Recent Labs  Lab 11/11/18 1304 11/11/18 1450 11/11/18 1556 11/12/18 0851  PROCALCITON  --   --  <0.10 <0.10  LATICACIDVEN 0.6 0.5  --   --     Recent Results (from the past 240 hour(s))  Culture, blood (routine x 2)     Status: None (Preliminary result)   Collection Time: 11/11/18  1:04 PM   Specimen: BLOOD RIGHT FOREARM  Result Value Ref Range Status   Specimen Description   Final    BLOOD RIGHT FOREARM Performed at [facility], [place] [address], [place], [place] [address]    Special Requests   Final    BOTTLES DRAWN AEROBIC ONLY Blood Culture results may not be optimal due to an inadequate volume of blood received in culture bottles Performed at [place] [address], [place], [place] [address]    Culture   Final    NO GROWTH < 24 HOURS Performed at [place] [address], [place], [place] [address]    Report Status PENDING  Incomplete  SARS Coronavirus 2 (CEPHEID - Performed in [place] hospital lab), Hosp Order     Status: None   Collection Time: 11/11/18  1:04 PM   Specimen: Nasopharyngeal Swab  Result Value Ref Range Status   SARS Coronavirus 2 NEGATIVE NEGATIVE Final    Comment: (NOTE) If result is NEGATIVE SARS-CoV-2 target nucleic acids are NOT DETECTED. The SARS-CoV-2 RNA is generally detectable in upper and lower  respiratory specimens during the acute phase of infection. The lowest  concentration of SARS-CoV-2 viral copies this assay can detect is 250  copies / mL. A negative result does not preclude SARS-CoV-2 infection  and should not be used as the sole basis for treatment or other  patient  management decisions.  A negative result may occur with  improper specimen collection / handling, submission of specimen other  than nasopharyngeal swab, presence of viral mutation(s) within the  areas targeted by this assay, and inadequate number of viral copies  (<250 copies / mL). A negative result must be combined with clinical  observations, patient history, and epidemiological information. If result is POSITIVE SARS-CoV-2 target nucleic acids are DETECTED. The SARS-CoV-2 RNA is generally detectable in upper and lower  respiratory specimens dur ing the acute phase of infection.  Positive  results are indicative of active infection with SARS-CoV-2.  Clinical  correlation with patient history and other diagnostic information is  necessary to determine patient infection status.  Positive results do  not rule out bacterial infection or co-infection with other viruses. If result is PRESUMPTIVE POSTIVE SARS-CoV-2 nucleic acids MAY BE PRESENT.   A presumptive positive result was obtained on the submitted specimen  and confirmed on repeat testing.  While 2019 novel coronavirus  (SARS-CoV-2) nucleic acids may be present in the submitted sample  additional confirmatory testing may be necessary for epidemiological  and / or clinical management purposes  to differentiate between  SARS-CoV-2 and other Sarbecovirus currently known to infect humans.  If clinically indicated additional testing with an alternate test  methodology [phone]) is advised. The SARS-CoV-2 RNA is generally  detectable in upper and lower respiratory sp ecimens during the acute  phase of infection. The expected result is Negative. Fact Sheet for Patients:  StrictlyIdeas.no Fact Sheet for Healthcare Providers: BankingDealers.co.za This test is not yet approved or cleared by the [place] FDA and has been authorized for detection and/or diagnosis of SARS-CoV-2  by FDA under  an Emergency Use Authorization (EUA).  This EUA will remain in effect (meaning this test can be used) for the duration of the COVID-19 declaration under Section 564(b)(1) of the Act, 21 U.S.C. section 360bbb-3(b)(1), unless the authorization is terminated or revoked sooner. Performed at [facility], [place] [address], [place], [place] [address]   Culture, blood (routine x 2)     Status: None (Preliminary result)   Collection Time: 11/11/18  4:51 PM   Specimen: BLOOD  Result Value Ref Range Status   Specimen Description   Final    BLOOD RIGHT ANTECUBITAL Performed at [place] [address] [place], [place] [address]    Special Requests   Final    BOTTLES DRAWN AEROBIC AND ANAEROBIC Blood Culture adequate volume Performed at [place] [address], [place], [place] [address]    Culture   Final    NO GROWTH < 12 HOURS Performed at [place] [address] [place], [place] [address]    Report Status PENDING  Incomplete  MRSA PCR Screening     Status: None   Collection Time: 11/12/18  5:10 AM   Specimen: Nasal Mucosa; Nasopharyngeal  Result Value Ref Range Status   MRSA by PCR NEGATIVE NEGATIVE Final    Comment:        The GeneXpert MRSA Assay (FDA approved for NASAL specimens only), is one component of a comprehensive MRSA colonization surveillance program. It is not intended to diagnose MRSA infection nor to guide or monitor treatment for MRSA infections. Performed at [facility], [place] [address], [place], [place] [address]          Radiology Studies: Ct Head Wo Contrast  Result Date: 11/11/2018 CLINICAL DATA:  Altered level of consciousness EXAM: CT HEAD WITHOUT CONTRAST TECHNIQUE: Contiguous axial images were obtained from the base of the skull through the vertex without intravenous contrast. COMPARISON:  MRI 10/09/2017.  CT head 10/01/2017 FINDINGS: Brain: Chronic  microvascular disease changes in the deep white matter, stable. No acute intracranial abnormality. Specifically, no hemorrhage, hydrocephalus, mass lesion, acute infarction, or significant intracranial injury. Vascular: No hyperdense vessel or unexpected calcification. Skull: No acute calvarial abnormality. Sinuses/Orbits: Visualized paranasal sinuses and mastoids clear. Orbital soft tissues unremarkable. 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 No acute intracranial abnormality. Electronically Signed   By: Rolm Baptise M.D.   On: 11/11/2018 12:52   US Renal  Result Date: 11/11/2018 CLINICAL DATA:  Acute renal failure EXAM: RENAL / URINARY TRACT ULTRASOUND COMPLETE COMPARISON:  09/26/2017 FINDINGS: Right Kidney: Renal measurements: 13.1 x 6.1 x 6.0 cm. = volume: 253 mL . Echogenicity within normal limits. No mass or hydronephrosis visualized. Left Kidney: Renal measurements: 6.8 x 2.7 x 2.2 cm = volume: 21 mL. Cortical thinning and increased echogenicity is noted when compared with the right kidney. These changes are stable from the prior CT examination. Bladder: Appears normal for degree of bladder distention. IMPRESSION: Left renal atrophy unchanged from prior CT examination. Normal-appearing right kidney. Electronically Signed   By: Inez Catalina M.D.   On: 11/11/2018 16:04   Dg Pelvis Portable  Result Date: 11/11/2018 CLINICAL DATA:  Multiple falls. EXAM: PORTABLE PELVIS 1-2 VIEWS COMPARISON:  None. FINDINGS: There is no evidence of pelvic fracture or diastasis. No pelvic bone lesions are seen. IMPRESSION: Negative. Electronically Signed   By: Marijo Conception M.D.   On: 11/11/2018 13:48  Dg Chest Portable 1 View  Result Date: 11/11/2018 CLINICAL DATA:  Weakness for 3 days. EXAM: PORTABLE CHEST 1 VIEW COMPARISON:  04/27/2018 FINDINGS: Left base opacity, likely atelectasis or early infiltrate. Right lung clear. Heart is normal size. No effusions or acute  bony abnormality. IMPRESSION: Left base atelectasis or early infiltrate. Electronically Signed   By: Rolm Baptise M.D.   On: 11/11/2018 12:53        Scheduled Meds: . aspirin  81 mg Oral Daily  . atorvastatin  80 mg Oral q1800  . cholestyramine  4 g Oral Daily  . ferrous sulfate  325 mg Oral Q breakfast  . folic acid  0.5 mg Oral Daily  . gabapentin  100 mg Oral Daily  . heparin  5,000 Units Subcutaneous Q8H  . mouth rinse  15 mL Mouth Rinse BID  . metoprolol tartrate  12.5 mg Oral BID  . mirtazapine  7.5 mg Oral QHS  . pantoprazole  40 mg Oral Daily  . sertraline  100 mg Oral Daily  . tamsulosin  0.4 mg Oral Daily   Continuous Infusions: .  sodium bicarbonate  infusion 1000 mL 100 mL/hr at 11/12/18 1309     LOS: 1 day    Time spent: 35 minutes spent in the coordination of care.    Jonnie Finner, DO [facility] Pager [phone]  If 7PM-7AM, please contact night-coverage [email or web address] Password [facility] 11/12/2018, 4:03 PM

## 2018-11-12 NOTE — ED Notes (Signed)
ED TO INPATIENT HANDOFF REPORT  Name/Age/Gender Luke Kaufman 68 y.o. male  Code Status    Code Status Orders  (From admission, onward)         Start     Ordered   11/11/18 2222  Full code  Continuous     11/11/18 2221        Code Status History    Date Active Date Inactive Code Status Order ID Comments User Context   10/10/2017 0728 11/07/2017 1713 Full Code [id number]  Janyth Pupa Inpatient   09/04/2017 0202 10/09/2017 1719 Full Code [id number]  Etta Quill, DO ED   08/09/2014 0928 08/13/2014 1344 Full Code [id number]  Orson Eva, MD Inpatient   05/16/2013 2150 05/19/2013 1711 Full Code [id number]  Etta Quill, DO ED   Advance Care Planning Activity      Home/SNF/Other Nursing Home  Chief Complaint weakness  Level of Care/Admitting Diagnosis ED Disposition    ED Disposition Condition [name]: [place] [[id number]]  Level of Care: Telemetry [5]  Admit to tele based on following criteria: Monitor for Ischemic changes  Covid Evaluation: Asymptomatic Screening Protocol (No Symptoms)  Diagnosis: Acute kidney injury (AKI) with acute tubular necrosis (ATN) [facility]) [[id number]]  Admitting Physician: Barb Merino [[id number]]  Attending Physician: Barb Merino [[id number]]  Estimated length of stay: past midnight tomorrow  Certification:: I certify this patient will need inpatient services for at least 2 midnights  PT Class (Do Not Modify): Inpatient [101]  PT Acc Code (Do Not Modify): Private [1]       Medical History Past Medical History:  Diagnosis Date  . Acute metabolic encephalopathy 5/42/7062  . Gastric ulcer   . Hyperlipemia   . Hypertension   . Hypospadias   . MI (myocardial infarction) ([place])   . Pulmonary embolism and infarction ([place]) 10/14/2017  . Stroke ([place])   . Tobacco abuse     Allergies No Known Allergies  IV Location/Drains/Wounds Patient Lines/Drains/Airways Status   Active Line/Drains/Airways    Name:   Placement date:   Placement time:   Site:   Days:   Peripheral IV 11/11/18 Left Wrist   11/11/18    1200    Wrist   1   Peripheral IV 11/11/18 Right Antecubital   11/11/18    1651    Antecubital   1   PICC Double Lumen 10/04/17 PICC Right Other (Comment) 24 cm   10/04/17    0903     404   Negative Pressure Wound Therapy Abdomen   09/23/17    1546    -   415   Ileostomy Standard (end) RUQ   09/06/17    0920    RUQ   432   Urethral Catheter Malena Edman, RN Latex 14 Fr.   11/11/18    1638    Latex   1   Incision (Closed) 09/12/17 Neck   09/12/17    1405     426   Incision (Closed) 09/23/17 Abdomen Other (Comment)   09/23/17    1539     415   Tracheostomy [place] 6 mm Cuffed;Proximal   10/03/17    1620    6 mm   405   Wound / Incision (Open or Dehisced) 10/01/17 Incision - Open Abdomen Anterior incision has been open since 09/06/2017   10/01/17    2000    Abdomen   407  Labs/Imaging Results for orders placed or performed during the hospital encounter of 11/11/18 (from the past 48 hour(s))  CK     Status: Abnormal   Collection Time: 11/11/18  1:01 PM  Result Value Ref Range   Total CK 782 (H) 49 - 397 U/L    Comment: Performed at [facility], [place] [address] [place], [place] [address]  Comprehensive metabolic panel     Status: Abnormal   Collection Time: 11/11/18  1:04 PM  Result Value Ref Range   Sodium 137 135 - 145 mmol/L   Potassium 4.8 3.5 - 5.1 mmol/L   Chloride 114 (H) 98 - 111 mmol/L   CO2 <7 (L) 22 - 32 mmol/L   Glucose, Bld 79 70 - 99 mg/dL   BUN 89 (H) 8 - 23 mg/dL   Creatinine, Ser 9.44 (H) 0.61 - 1.24 mg/dL   Calcium 8.2 (L) 8.9 - 10.3 mg/dL   Total Protein 6.5 6.5 - 8.1 g/dL   Albumin 3.1 (L) 3.5 - 5.0 g/dL   AST 18 15 - 41 U/L   ALT 15 0 - 44 U/L   Alkaline Phosphatase 99 38 - 126 U/L   Total Bilirubin 0.5 0.3 - 1.2 mg/dL   GFR calc non Af Amer 5 (L) >60 mL/min   GFR calc Af Amer 6 (L) >60 mL/min   Anion gap NOT CALCULATED 5 - 15     Comment: Performed at [facility], [place] [address] [place], [place] [address]  CBC with Differential     Status: Abnormal   Collection Time: 11/11/18  1:04 PM  Result Value Ref Range   WBC 9.9 4.0 - 10.5 K/uL   RBC 3.74 (L) 4.22 - 5.81 MIL/uL   Hemoglobin 10.5 (L) 13.0 - 17.0 g/dL   HCT 33.7 (L) 39.0 - 52.0 %   MCV 90.1 80.0 - 100.0 fL   MCH 28.1 26.0 - 34.0 pg   MCHC 31.2 30.0 - 36.0 g/dL   RDW 14.7 11.5 - 15.5 %   Platelets 190 150 - 400 K/uL   nRBC 0.0 0.0 - 0.2 %   Neutrophils Relative % 87 %   Neutro Abs 8.5 (H) 1.7 - 7.7 K/uL   Lymphocytes Relative 6 %   Lymphs Abs 0.6 (L) 0.7 - 4.0 K/uL   Monocytes Relative 5 %   Monocytes Absolute 0.5 0.1 - 1.0 K/uL   Eosinophils Relative 1 %   Eosinophils Absolute 0.1 0.0 - 0.5 K/uL   Basophils Relative 0 %   Basophils Absolute 0.0 0.0 - 0.1 K/uL   Immature Granulocytes 1 %   Abs Immature Granulocytes 0.11 (H) 0.00 - 0.07 K/uL    Comment: Performed at [facility], [place] [address] [place], [place] [address]  Lactic acid, plasma     Status: None   Collection Time: 11/11/18  1:04 PM  Result Value Ref Range   Lactic Acid, Venous 0.6 0.5 - 1.9 mmol/L    Comment: Performed at [facility], [place] [address], [place], [place] [address]  SARS Coronavirus 2 (CEPHEID - Performed in [place] hospital lab), Hosp Order     Status: None   Collection Time: 11/11/18  1:04 PM   Specimen: Nasopharyngeal Swab  Result Value Ref Range   SARS Coronavirus 2 NEGATIVE NEGATIVE    Comment: (NOTE) If result is NEGATIVE SARS-CoV-2 target nucleic acids are NOT DETECTED. The SARS-CoV-2 RNA is generally detectable in upper and lower  respiratory specimens during the acute phase of infection. The lowest  concentration of SARS-CoV-2 viral copies this assay can detect is 250  copies / mL. A negative result does not preclude SARS-CoV-2 infection  and should not be used as the sole basis for treatment or  other  patient management decisions.  A negative result may occur with  improper specimen collection / handling, submission of specimen other  than nasopharyngeal swab, presence of viral mutation(s) within the  areas targeted by this assay, and inadequate number of viral copies  (<250 copies / mL). A negative result must be combined with clinical  observations, patient history, and epidemiological information. If result is POSITIVE SARS-CoV-2 target nucleic acids are DETECTED. The SARS-CoV-2 RNA is generally detectable in upper and lower  respiratory specimens dur ing the acute phase of infection.  Positive  results are indicative of active infection with SARS-CoV-2.  Clinical  correlation with patient history and other diagnostic information is  necessary to determine patient infection status.  Positive results do  not rule out bacterial infection or co-infection with other viruses. If result is PRESUMPTIVE POSTIVE SARS-CoV-2 nucleic acids MAY BE PRESENT.   A presumptive positive result was obtained on the submitted specimen  and confirmed on repeat testing.  While 2019 novel coronavirus  (SARS-CoV-2) nucleic acids may be present in the submitted sample  additional confirmatory testing may be necessary for epidemiological  and / or clinical management purposes  to differentiate between  SARS-CoV-2 and other Sarbecovirus currently known to infect humans.  If clinically indicated additional testing with an alternate test  methodology [id number]) is advised. The SARS-CoV-2 RNA is generally  detectable in upper and lower respiratory sp ecimens during the acute  phase of infection. The expected result is Negative. Fact Sheet for Patients:  StrictlyIdeas.no Fact Sheet for Healthcare Providers: BankingDealers.co.za This test is not yet approved or cleared by the [place] FDA and has been authorized for detection and/or diagnosis of  SARS-CoV-2 by FDA under an Emergency Use Authorization (EUA).  This EUA will remain in effect (meaning this test can be used) for the duration of the COVID-19 declaration under Section 564(b)(1) of the Act, 21 U.S.C. section 360bbb-3(b)(1), unless the authorization is terminated or revoked sooner. Performed at [facility], [place] [address], [place], [place] [address]   Troponin I (High Sensitivity)     Status: Abnormal   Collection Time: 11/11/18  1:04 PM  Result Value Ref Range   Troponin I (High Sensitivity) 1,978 (HH) <18 ng/L    Comment: CRITICAL RESULT CALLED TO, READ BACK BY AND VERIFIED WITH: TIM SMITH,RN [id number] @ 1359 BY J SCOTTON (NOTE) Elevated high sensitivity troponin I (hsTnI) values and significant  changes across serial measurements may suggest ACS but many other  chronic and acute conditions are known to elevate hsTnI results.  Refer to the Links section for chest pain algorithms and additional  guidance. Performed at [facility], [place] [address] [place], [place] [address]   Troponin I (High Sensitivity)     Status: Abnormal   Collection Time: 11/11/18  2:49 PM  Result Value Ref Range   Troponin I (High Sensitivity) 2,015 (HH) <18 ng/L    Comment: CRITICAL RESULT CALLED TO, READ BACK BY AND VERIFIED WITH: TIM SMITH,RN [id number] @ 1531 BY J SCOTTON (NOTE) Elevated high sensitivity troponin I (hsTnI) values and significant  changes across serial measurements may suggest ACS but many other  chronic and acute conditions are known to  elevate hsTnI results.  Refer to the Links section for chest pain algorithms and additional  guidance. Performed at [facility], [place] [address] [place], [place] [address]   Lactic acid, plasma     Status: None   Collection Time: 11/11/18  2:50 PM  Result Value Ref Range   Lactic Acid, Venous 0.5 0.5 - 1.9 mmol/L    Comment: Performed at [facility] [place]  [address], [place], [place] [address]  Procalcitonin - Baseline     Status: None   Collection Time: 11/11/18  3:56 PM  Result Value Ref Range   Procalcitonin <0.10 ng/mL    Comment:        Interpretation: PCT (Procalcitonin) <= 0.5 ng/mL: Systemic infection (sepsis) is not likely. Local bacterial infection is possible. (NOTE)       Sepsis PCT Algorithm           Lower Respiratory Tract                                      Infection PCT Algorithm    ----------------------------     ----------------------------         PCT < 0.25 ng/mL                PCT < 0.10 ng/mL         Strongly encourage             Strongly discourage   discontinuation of antibiotics    initiation of antibiotics    ----------------------------     -----------------------------       PCT 0.25 - 0.50 ng/mL            PCT 0.10 - 0.25 ng/mL               OR       >80% decrease in PCT            Discourage initiation of                                            antibiotics      Encourage discontinuation           of antibiotics    ----------------------------     -----------------------------         PCT >= 0.50 ng/mL              PCT 0.26 - 0.50 ng/mL               AND        <80% decrease in PCT             Encourage initiation of                                             antibiotics       Encourage continuation           of antibiotics    ----------------------------     -----------------------------        PCT >= 0.50 ng/mL                  PCT > 0.50 ng/mL  AND         increase in PCT                  Strongly encourage                                      initiation of antibiotics    Strongly encourage escalation           of antibiotics                                     -----------------------------                                           PCT <= 0.25 ng/mL                                                 OR                                        > 80% decrease in PCT                                      Discontinue / Do not initiate                                             antibiotics Performed at [place] [address] [place], [place] [address]   Urinalysis, Routine w reflex microscopic     Status: Abnormal   Collection Time: 11/11/18  4:51 PM  Result Value Ref Range   Color, Urine YELLOW YELLOW   APPearance HAZY (A) CLEAR   Specific Gravity, Urine 1.010 1.005 - 1.030   pH 5.0 5.0 - 8.0   Glucose, UA NEGATIVE NEGATIVE mg/dL   Hgb urine dipstick LARGE (A) NEGATIVE   Bilirubin Urine NEGATIVE NEGATIVE   Ketones, ur NEGATIVE NEGATIVE mg/dL   Protein, ur NEGATIVE NEGATIVE mg/dL   Nitrite NEGATIVE NEGATIVE   Leukocytes,Ua NEGATIVE NEGATIVE   RBC / HPF 21-50 0 - 5 RBC/hpf   WBC, UA 6-10 0 - 5 WBC/hpf   Bacteria, UA FEW (A) NONE SEEN    Comment: Performed at [facility], [place] [address], [place], [place] [address]  Sodium, urine, random     Status: None   Collection Time: 11/11/18  4:51 PM  Result Value Ref Range   Sodium, Ur 14 mmol/L    Comment: Performed at [facility], [place] [address], [place], [place] [address]  Creatinine, urine, random     Status: None   Collection Time: 11/11/18  4:51 PM  Result Value Ref Range   Creatinine, Urine 116.64 mg/dL    Comment: Performed at [facility], [place] [address] [place], [place] [address]  Ct Head Wo Contrast  Result Date: 11/11/2018 CLINICAL DATA:  Altered level of consciousness EXAM: CT HEAD WITHOUT CONTRAST TECHNIQUE: Contiguous axial images were obtained from the base of the skull through the vertex without intravenous contrast. COMPARISON:  MRI 10/09/2017.  CT head 10/01/2017 FINDINGS: Brain: Chronic microvascular disease changes in the deep white matter, stable. No acute intracranial abnormality. Specifically, no hemorrhage, hydrocephalus, mass lesion, acute infarction, or significant intracranial injury. Vascular: No hyperdense  vessel or unexpected calcification. Skull: No acute calvarial abnormality. Sinuses/Orbits: Visualized paranasal sinuses and mastoids clear. Orbital soft tissues unremarkable. 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 No acute intracranial abnormality. Electronically Signed   By: Rolm Baptise M.D.   On: 11/11/2018 12:52   US Renal  Result Date: 11/11/2018 CLINICAL DATA:  Acute renal failure EXAM: RENAL / URINARY TRACT ULTRASOUND COMPLETE COMPARISON:  09/26/2017 FINDINGS: Right Kidney: Renal measurements: 13.1 x 6.1 x 6.0 cm. = volume: 253 mL . Echogenicity within normal limits. No mass or hydronephrosis visualized. Left Kidney: Renal measurements: 6.8 x 2.7 x 2.2 cm = volume: 21 mL. Cortical thinning and increased echogenicity is noted when compared with the right kidney. These changes are stable from the prior CT examination. Bladder: Appears normal for degree of bladder distention. IMPRESSION: Left renal atrophy unchanged from prior CT examination. Normal-appearing right kidney. Electronically Signed   By: Inez Catalina M.D.   On: 11/11/2018 16:04   Dg Pelvis Portable  Result Date: 11/11/2018 CLINICAL DATA:  Multiple falls. EXAM: PORTABLE PELVIS 1-2 VIEWS COMPARISON:  None. FINDINGS: There is no evidence of pelvic fracture or diastasis. No pelvic bone lesions are seen. IMPRESSION: Negative. Electronically Signed   By: Marijo Conception M.D.   On: 11/11/2018 13:48   Dg Chest Portable 1 View  Result Date: 11/11/2018 CLINICAL DATA:  Weakness for 3 days. EXAM: PORTABLE CHEST 1 VIEW COMPARISON:  04/27/2018 FINDINGS: Left base opacity, likely atelectasis or early infiltrate. Right lung clear. Heart is normal size. No effusions or acute bony abnormality. IMPRESSION: Left base atelectasis or early infiltrate. Electronically Signed   By: Rolm Baptise M.D.   On: 11/11/2018 12:53    Pending Labs Unresulted Labs (From admission, onward)    Start     Ordered   11/12/18  0500  TSH  Tomorrow morning,   R     11/11/18 2221   11/12/18 0500  Comprehensive metabolic panel  Tomorrow morning,   R     11/11/18 2221   11/12/18 0500  CBC  Tomorrow morning,   R     11/11/18 2221   11/12/18 0500  Uric acid  Tomorrow morning,   R     11/11/18 2221   11/12/18 0500  Procalcitonin  Daily,   R     11/11/18 1555   11/11/18 2222  HIV antibody (Routine Testing)  Once,   STAT     11/11/18 2221   11/11/18 1407  Urine culture  ONCE - STAT,   STAT     11/11/18 1414   11/11/18 1156  Culture, blood (routine x 2)  BLOOD CULTURE X 2,   STAT     11/11/18 1156          Vitals/Pain Today's Vitals   11/12/18 0000 11/12/18 0045 11/12/18 0130 11/12/18 0147  BP: 127/62 (!) 124/59 (!) 133/57   Pulse: 85 90 85   Resp: 15 (!) 25 20   Temp:    (!) 97.5 F (36.4 C)  TempSrc:    Oral  SpO2: 96% 97% 99%   PainSc:        Isolation Precautions No active isolations  Medications Medications  sodium bicarbonate 150 mEq in dextrose 5 % 1,000 mL infusion ( Intravenous New Bag/Given 11/11/18 1656)  aspirin chewable tablet 81 mg (has no administration in time range)  atorvastatin (LIPITOR) tablet 80 mg (has no administration in time range)  cholestyramine (QUESTRAN) packet 4 g (has no administration in time range)  metoprolol tartrate (LOPRESSOR) tablet 12.5 mg (0 mg Oral Hold 11/11/18 2303)  mirtazapine (REMERON) tablet 7.5 mg (7.5 mg Oral Given 11/11/18 2337)  sertraline (ZOLOFT) tablet 100 mg (has no administration in time range)  diphenoxylate-atropine (LOMOTIL) 2.5-0.025 MG per tablet 1 tablet (has no administration in time range)  pantoprazole (PROTONIX) EC tablet 40 mg (has no administration in time range)  tamsulosin (FLOMAX) capsule 0.4 mg (has no administration in time range)  ferrous sulfate tablet 325 mg (has no administration in time range)  folic acid (FOLVITE) tablet 400 mcg (has no administration in time range)  gabapentin (NEURONTIN) capsule 100 mg (has no  administration in time range)  heparin injection 5,000 Units (0 Units Subcutaneous Hold 11/12/18 0115)  acetaminophen (TYLENOL) tablet 650 mg (has no administration in time range)    Or  acetaminophen (TYLENOL) suppository 650 mg (has no administration in time range)  ondansetron (ZOFRAN) tablet 4 mg ( Oral See Alternative 11/11/18 2349)    Or  ondansetron (ZOFRAN) injection 4 mg (4 mg Intravenous Given 11/11/18 2349)  albuterol (PROVENTIL) (2.5 MG/3ML) 0.083% nebulizer solution 2.5 mg (has no administration in time range)  sodium chloride 0.9 % bolus 1,000 mL (0 mLs Intravenous Stopped 11/11/18 1503)  azithromycin (ZITHROMAX) 500 mg in sodium chloride 0.9 % 250 mL IVPB (0 mg Intravenous Stopped 11/12/18 0036)  ceFEPIme (MAXIPIME) 2 g in sodium chloride 0.9 % 100 mL IVPB (0 g Intravenous Stopped 11/11/18 1523)  vancomycin (VANCOCIN) 1,500 mg in sodium chloride 0.9 % 500 mL IVPB (0 mg Intravenous Stopped 11/11/18 2151)  sodium chloride 0.9 % bolus 1,000 mL (0 mLs Intravenous Stopped 11/12/18 0121)    Mobility walks with device

## 2018-11-12 NOTE — Consult Note (Signed)
[place] Nurse wound consult note Patient is known to our service. Has not been seen by our team since June of 2019. Reason for Consult: midline wound with mesh fragments, full thickness. RLQ ileostomy  Wound type: Surgical with complications Pressure Injury POA: NA Measurement: 12 x 9 x 0.1cm Wound bed: thin layer of epidermis through which discolored mesh is protruding, primarily at periphery from 9-2 o'clock Drainage (amount, consistency, odor) serous, no odor after cleansing. Periwound: intact with evidence of wound healing (scarring) Dressing procedure/placement/frequency: I will implement a conservative POC for the midline wound using NS dressing changed twice daily and PRN.   [place] Nurse ostomy consult note Stoma type/location: RLQ ileostomy Stomal assessment/size: 7/8 inches round, red, moist, os at center Peristomal assessment: intact, macerated, numerous creases Treatment options for stomal/peristomal skin: skin barrier ring Output: 50 mls dark green effluent earlier today Ostomy pouching: 2pc. 2 and 1/4 inch pouching system. Education provided: None today. Patient lives in a SNF and is not self care. This is an established ostomy. Additionally, he has been difficult to rouse today and was disoriented earlier this am. Enrolled patient in [place] program: No  Orders provided for ostomy supplies. Three of each supply needed are requested from Unit Secretary, Ron.   [place] nursing team will not follow, but will remain available to this patient, the nursing and medical teams.  Please re-consult if needed. Thanks, Maudie Flakes, MSN, RN, [place], Arther Abbott  Pager# [phone]

## 2018-11-13 LAB — COMPREHENSIVE METABOLIC PANEL
ALT: 11 U/L (ref 0–44)
AST: 25 U/L (ref 15–41)
Albumin: 2.3 g/dL — ABNORMAL LOW (ref 3.5–5.0)
Alkaline Phosphatase: 63 U/L (ref 38–126)
Anion gap: 18 — ABNORMAL HIGH (ref 5–15)
BUN: 79 mg/dL — ABNORMAL HIGH (ref 8–23)
CO2: 16 mmol/L — ABNORMAL LOW (ref 22–32)
Calcium: 6.8 mg/dL — ABNORMAL LOW (ref 8.9–10.3)
Chloride: 107 mmol/L (ref 98–111)
Creatinine, Ser: 8.4 mg/dL — ABNORMAL HIGH (ref 0.61–1.24)
GFR calc Af Amer: 7 mL/min — ABNORMAL LOW (ref >60)
GFR calc non Af Amer: 6 mL/min — ABNORMAL LOW (ref >60)
Glucose, Bld: 109 mg/dL — ABNORMAL HIGH (ref 70–99)
Potassium: 2.7 mmol/L — CL (ref 3.5–5.1)
Sodium: 141 mmol/L (ref 135–145)
Total Bilirubin: 0.5 mg/dL (ref 0.3–1.2)
Total Protein: 5 g/dL — ABNORMAL LOW (ref 6.5–8.1)

## 2018-11-13 LAB — CBC
HCT: 25.6 % — ABNORMAL LOW (ref 39.0–52.0)
Hemoglobin: 8.4 g/dL — ABNORMAL LOW (ref 13.0–17.0)
MCH: 28.8 pg (ref 26.0–34.0)
MCHC: 32.8 g/dL (ref 30.0–36.0)
MCV: 87.7 fL (ref 80.0–100.0)
Platelets: 165 10*3/uL (ref 150–400)
RBC: 2.92 MIL/uL — ABNORMAL LOW (ref 4.22–5.81)
RDW: 14.8 % (ref 11.5–15.5)
WBC: 7.7 10*3/uL (ref 4.0–10.5)
nRBC: 0 % (ref 0.0–0.2)

## 2018-11-13 LAB — PROCALCITONIN: Procalcitonin: <0.1 ng/mL

## 2018-11-13 MED ORDER — POTASSIUM CHLORIDE 10 MEQ/100ML IV SOLN
10.0000 meq | INTRAVENOUS | Status: AC
  Administered 2018-11-13 (×4): 10 meq via INTRAVENOUS
  Filled 2018-11-13 (×4): qty 100

## 2018-11-13 MED ORDER — POTASSIUM CHLORIDE 10 MEQ/100ML IV SOLN: 10.0000 meq | INTRAVENOUS | Status: DC

## 2018-11-13 MED ORDER — POTASSIUM CHLORIDE 20 MEQ PO PACK
20.0000 meq | PACK | Freq: Two times a day (BID) | ORAL | Status: DC
  Administered 2018-11-13: 20 meq via ORAL
  Filled 2018-11-13: qty 1

## 2018-11-13 MED ORDER — POTASSIUM CHLORIDE 20 MEQ PO PACK: 20.0000 meq | PACK | Freq: Three times a day (TID) | ORAL | Status: DC

## 2018-11-13 MED ORDER — POTASSIUM CHLORIDE 20 MEQ PO PACK
20.0000 meq | PACK | Freq: Two times a day (BID) | ORAL | Status: DC
  Administered 2018-11-13 (×2): 20 meq via ORAL
  Filled 2018-11-13 (×4): qty 1

## 2018-11-13 NOTE — Evaluation (Signed)
Physical Therapy Evaluation Patient Details Name: Luke Kaufman MRN: [id number] DOB: 02/05/1951 Today's Date: 11/13/2018   History of Present Illness  68 y.o. 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 fib, chronic left arm flaccid paralysis, stroke, and long-term nursing home resident and admitted for acute renal failure.  Clinical Impression  Pt admitted with above diagnosis. Pt currently with functional limitations due to the deficits listed below (see PT Problem List).  Pt will benefit from skilled PT to increase their independence and safety with mobility to allow discharge to the venue listed below.  Pt assisted with standing twice and then repositioned in supine. L UE with edema so elevated above heart and educated pt to maintain elevation and float heels during acute stay.  Pt to return to SNF and would benefit from rehab upon d/c.     Follow Up Recommendations SNF    Equipment Recommendations  None recommended by PT    Recommendations for Other Services       Precautions / Restrictions Precautions Precautions: Fall Precaution Comments: L sided residual deficits      Mobility  Bed Mobility Overal bed mobility: Needs Assistance Bed Mobility: Supine to Sit;Sit to Supine     Supine to sit: Min assist Sit to supine: Mod assist   General bed mobility comments: assist for trunk upright, assist for LEs onto bed  Transfers Overall transfer level: Needs assistance Equipment used: Rolling walker (2 wheeled) Transfers: Sit to/from Stand Sit to Stand: Min assist;Mod assist;From elevated surface;+2 physical assistance         General transfer comment: verbal cues for  technique, placed L hand on RW however pt unable to grip, pt fatigued quickly upon standing, performed sit to stand twice  Ambulation/Gait                Stairs            Wheelchair Mobility    Modified Rankin (Stroke Patients Only)       Balance Overall balance assessment: Needs assistance Sitting-balance support: No upper extremity supported;Feet supported Sitting balance-Leahy Scale: Fair     Standing balance support: Bilateral upper extremity supported Standing balance-Leahy Scale: Poor                               Pertinent Vitals/Pain Pain Assessment: No/denies pain    Home Living Family/patient expects to be discharged to:: Skilled nursing facility                      Prior Function Level of Independence: Needs assistance   Gait / Transfers Assistance Needed: pt states he can ambulate without assistive device however facility prefers he use w/c for mobilizing           Hand Dominance        Extremity/Trunk Assessment   Upper Extremity Assessment Upper Extremity Assessment: LUE deficits/detail LUE Deficits / Details: <25% AROM with shoulder and elbow, minimal active wrist and finger movement    Lower Extremity Assessment Lower Extremity Assessment: LLE deficits/detail;Generalized weakness LLE Deficits / Details: grossly at least 3/5, tends to rest ankle in PF, limited DF       Communication   Communication: No difficulties  Cognition Arousal/Alertness:  Awake/alert Behavior During Therapy: WFL for tasks assessed/performed Overall Cognitive Status: Within Functional Limits for tasks assessed                                        General Comments      Exercises     Assessment/Plan    PT Assessment Patient needs continued PT services  PT Problem List Decreased strength;Decreased mobility;Decreased activity tolerance;Decreased balance;Decreased knowledge of use of DME;Decreased  coordination       PT Treatment Interventions DME instruction;Therapeutic exercise;Gait training;Functional mobility training;Therapeutic activities;Patient/family education;Balance training    PT Goals (Current goals can be found in the Care Plan section)  Acute Rehab PT Goals PT Goal Formulation: With patient Time For Goal Achievement: 11/20/18 Potential to Achieve Goals: Good    Frequency Min 2X/week   Barriers to discharge        Co-evaluation               AM-PAC PT "6 Clicks" Mobility  Outcome Measure Help needed turning from your back to your side while in a flat bed without using bedrails?: A Lot Help needed moving from lying on your back to sitting on the side of a flat bed without using bedrails?: A Lot Help needed moving to and from a bed to a chair (including a wheelchair)?: A Lot Help needed standing up from a chair using your arms (e.g., wheelchair or bedside chair)?: A Lot Help needed to walk in hospital room?: Total Help needed climbing 3-5 steps with a railing? : Total 6 Click Score: 10    End of Session Equipment Utilized During Treatment: Gait belt Activity Tolerance: Patient tolerated treatment well Patient left: in bed;with bed alarm set;with call bell/phone within reach Nurse Communication: Mobility status PT Visit Diagnosis: Other abnormalities of gait and mobility (R26.89)    Time: 1497-0263 PT Time Calculation (min) (ACUTE ONLY): 18 min   Charges:   PT Evaluation $PT Eval Low Complexity: Seligman, PT, DPT Acute Rehabilitation Services Office: [phone] Pager: [phone]  Trena Platt 11/13/2018, 1:28 PM

## 2018-11-13 NOTE — Care Management Important Message (Signed)
Important Message  Patient Details IM Letter given to Nancy Marus RN to present to the Patient Name: Luke Kaufman MRN: [id number] Date of Birth: 1950/12/13   Medicare Important Message Given:  Yes     Kerin Salen 11/13/2018, 10:00 AM

## 2018-11-13 NOTE — Progress Notes (Signed)
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 fib, chronic left arm flaccid paralysis and long-term nursing home resident presenting to the emergency room with abnormal lab test, feeling weak for about a week. Patient is poor historian. Reviewed his medical records from previous hospitalization. Also reviewed records from nursing home. Patient himself tells that he stays out a lot so he might have gotten dehydrated. He denies any nausea or vomiting. He says his appetite is good. No loose stool noted in colostomy. Patient states that he is urinating but cannot tell me when was the last time he had any urine. Denies any retention of urine or abdominal pain. He does have chronic wound from abdominal wall, currently under wound care. Patient says he does not smoke, however nursing home reported that he goes out in the sun to smoke and he stays outside long time and it was very hot outside. Today morning, he was noted to be more and more weaker. Usually he would transfer from bed to chair and walk with support and now he can barely walk or barely stand. He also seem to be confused than normal. So they did a lab test today where his BMP showed creatinine of 9.47, last creatinine in March 2020 was 1.78. No reports of fever. No other resident would COVID-19 at the nursing home.   Assessment & Plan:   Principal Problem:   Acute renal failure (HCC) Active Problems:   HTN (hypertension)   CKD (chronic kidney disease) stage 3, GFR  30-59 ml/min (HCC)   HLD (hyperlipidemia)   Elevated troponin   Left hemiparesis (HCC)   Acute encephalopathy   Acute kidney injury (AKI) with acute tubular necrosis (ATN) (HCC)   Acute renal failure with probable underlying chronic kidney disease stage III:     - etiology unknown     - s/p fluid bolus and now on bicarb gtt; HCO3- up to 10 this morning     - Neprhology consulted, will see today     - foley in place     - SCr 9.4 at admission; down to 8.4     - continue bicarb for now; follow up UOP     - Bicarb up to 16, SCr is stable at 8.4; appreciate nephro help  Elevated troponins     - With significant acute kidney injury.       - Denies any chest pain/EKG is nonischemic.     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       - CT head is normal; continue to monitor.     - PT/OT prior to discharge     - PT recs noted  Hypertension     - continue metoprolol with holding parameters.  Acute metabolic encephalopathy     - Suspect due to #1; Will need continuous monitoring and treatment for the cause.     - he's A&O x 3, appropriate interaction  SIRS  due to noninfectious cause:      - Blood pressures are stable.       - Lactic acid was normal.       - No localizing infection on clinical examination.       - Patient is a stabilizing.       - He was given loading dose of broad-spectrum antibiotics in the emergency room.  Given severe renal failure and alternative explanation of the disease, will hold further antibiotics.     - procal is negative; lactic acid remains normal     - question is he really this dehydrated; looks good clinically, continuing fluids; no abx at this time.  History of PE:      - On Eliquis.    Hx of midline abdominal wound     - WOCN; continue current care  There is some report that he was having high output from  his ostomy. Curious if this was the start point of his problem. Output on ostomy isn't too bad right now. UOP on the lower side. Continue fluids. Encourage diet.   DVT prophylaxis: eliquis Code Status: FULL   Disposition Plan: TBD   Consultants:   Nephrology  Subjective: "Hey! I remember you. And there's Dr. Jonnie Finner."  Objective: Vitals:   11/12/18 0302 11/12/18 1620 11/12/18 2131 11/13/18 0555  BP: 135/65 (!) 107/53 (!) 111/54 (!) 113/50  Pulse: 83 61 65 60  Resp: 16 20 20 18   Temp: 97.7 F (36.5 C) 97.9 F (36.6 C) 98.7 F (37.1 C) 98.8 F (37.1 C)  TempSrc: Oral Oral Oral Oral  SpO2: 99% 97% 98% 100%  Weight: 86 kg     Height: 5\' 7"  (1.702 m)       Intake/Output Summary (Last 24 hours) at 11/13/2018 1314 Last data filed at 11/13/2018 0600 Gross per 24 hour  Intake 2369.26 ml  Output 500 ml  Net 1869.26 ml   Filed Weights   11/12/18 0302  Weight: 86 kg    Examination:  General: 68 y.o. male resting in bed in NAD Cardiovascular: RRR, +S1, S2, no m/g/r, equal pulses throughout Respiratory: CTABL, no w/r/r, normal WOB GI: BS+, NDNT, no masses noted, no organomegaly noted, ostomy noted, midline wound bandaging CDI MSK: No e/c/c Skin: No rashes, bruises, ulcerations noted Neuro: A&O x 3, follows commands Psyc: Appropriate interaction and affect, calm/cooperative .    Data Reviewed: I have personally reviewed following labs and imaging studies.  CBC: Recent Labs  Lab 11/11/18 1304 11/12/18 0510 11/13/18 0430  WBC 9.9 8.3 7.7  NEUTROABS 8.5*  --   --   HGB 10.5* 9.5* 8.4*  HCT 33.7* 30.1* 25.6*  MCV 90.1 87.2 87.7  PLT 190 169 297   Basic Metabolic Panel: Recent Labs  Lab 11/11/18 1304 11/12/18 0510 11/13/18 0430  NA 137 141 141  K 4.8 4.0 2.7*  CL 114* 116* 107  CO2 <7* 10* 16*  GLUCOSE 79 91 109*  BUN 89* 84* 79*  CREATININE 9.44* 8.47* 8.40*  CALCIUM 8.2* 7.4* 6.8*   GFR: Estimated Creatinine Clearance: 8.8 mL/min (A) (by C-G  formula based on SCr of 8.4 mg/dL (H)). Liver Function Tests: Recent Labs  Lab 11/11/18 1304 11/12/18 0510 11/13/18 0430  AST 18 22 25   ALT 15 13 11   ALKPHOS 99 83 63  BILITOT 0.5 0.5 0.5  PROT 6.5 5.7* 5.0*  ALBUMIN 3.1* 2.7* 2.3*   No results for input(s): LIPASE, AMYLASE in the last 168 hours. No results  for input(s): AMMONIA in the last 168 hours. Coagulation Profile: No results for input(s): INR, PROTIME in the last 168 hours. Cardiac Enzymes: Recent Labs  Lab 11/11/18 1301  CKTOTAL 782*   BNP (last 3 results) No results for input(s): PROBNP in the last 8760 hours. HbA1C: No results for input(s): HGBA1C in the last 72 hours. CBG: No results for input(s): GLUCAP in the last 168 hours. Lipid Profile: No results for input(s): CHOL, HDL, LDLCALC, TRIG, CHOLHDL, LDLDIRECT in the last 72 hours. Thyroid Function Tests: Recent Labs    11/12/18 0510  TSH 1.485   Anemia Panel: No results for input(s): VITAMINB12, FOLATE, FERRITIN, TIBC, IRON, RETICCTPCT in the last 72 hours. Sepsis Labs: Recent Labs  Lab 11/11/18 1304 11/11/18 1450 11/11/18 1556 11/12/18 0851 11/13/18 0430  PROCALCITON  --   --  <0.10 <0.10 <0.10  LATICACIDVEN 0.6 0.5  --   --   --     Recent Results (from the past 240 hour(s))  Culture, blood (routine x 2)     Status: None (Preliminary result)   Collection Time: 11/11/18  1:04 PM   Specimen: BLOOD RIGHT FOREARM  Result Value Ref Range Status   Specimen Description   Final    BLOOD RIGHT FOREARM Performed at [name], [place] [address], [place], [place] [address]    Special Requests   Final    BOTTLES DRAWN AEROBIC ONLY Blood Culture results may not be optimal due to an inadequate volume of blood received in culture bottles Performed at [facility] [address], [place], [place] [address]    Culture   Final    NO GROWTH 2 DAYS Performed at [facility] [address], [place], [place] [address]     Report Status PENDING  Incomplete  SARS Coronavirus 2 (CEPHEID - Performed in [facility] hospital lab), Hosp Order     Status: None   Collection Time: 11/11/18  1:04 PM   Specimen: Nasopharyngeal Swab  Result Value Ref Range Status   SARS Coronavirus 2 NEGATIVE NEGATIVE Final    Comment: (NOTE) If result is NEGATIVE SARS-CoV-2 target nucleic acids are NOT DETECTED. The SARS-CoV-2 RNA is generally detectable in upper and lower  respiratory specimens during the acute phase of infection. The lowest  concentration of SARS-CoV-2 viral copies this assay can detect is 250  copies / mL. A negative result does not preclude SARS-CoV-2 infection  and should not be used as the sole basis for treatment or other  patient management decisions.  A negative result may occur with  improper specimen collection / handling, submission of specimen other  than nasopharyngeal swab, presence of viral mutation(s) within the  areas targeted by this assay, and inadequate number of viral copies  (<250 copies / mL). A negative result must be combined with clinical  observations, patient history, and epidemiological information. If result is POSITIVE SARS-CoV-2 target nucleic acids are DETECTED. The SARS-CoV-2 RNA is generally detectable in upper and lower  respiratory specimens dur ing the acute phase of infection.  Positive  results are indicative of active infection with SARS-CoV-2.  Clinical  correlation with patient history and other diagnostic information is  necessary to determine patient infection status.  Positive results do  not rule out bacterial infection or co-infection with other viruses. If result is PRESUMPTIVE POSTIVE SARS-CoV-2 nucleic acids MAY BE PRESENT.   A presumptive positive result was obtained on the submitted specimen  and confirmed on repeat testing.  While 2019 novel coronavirus  (SARS-CoV-2) nucleic acids may be present in the submitted sample  additional confirmatory testing may be  necessary for epidemiological  and / or clinical management purposes  to differentiate between  SARS-CoV-2 and other Sarbecovirus currently known to infect humans.  If clinically indicated additional testing with an alternate test  methodology [phone]) is advised. The SARS-CoV-2 RNA is generally  detectable in upper and lower respiratory sp ecimens during the acute  phase of infection. The expected result is Negative. Fact Sheet for Patients:  StrictlyIdeas.no Fact Sheet for Healthcare Providers: BankingDealers.co.za This test is not yet approved or cleared by the [place] FDA and has been authorized for detection and/or diagnosis of SARS-CoV-2 by FDA under an Emergency Use Authorization (EUA).  This EUA will remain in effect (meaning this test can be used) for the duration of the COVID-19 declaration under Section 564(b)(1) of the Act, 21 U.S.C. section 360bbb-3(b)(1), unless the authorization is terminated or revoked sooner. Performed at [facility], [place] [address], [place], [place] [address]   Culture, blood (routine x 2)     Status: None (Preliminary result)   Collection Time: 11/11/18  4:51 PM   Specimen: BLOOD  Result Value Ref Range Status   Specimen Description   Final    BLOOD RIGHT ANTECUBITAL Performed at [facility] [address], [place], [place] [address]    Special Requests   Final    BOTTLES DRAWN AEROBIC AND ANAEROBIC Blood Culture adequate volume Performed at [facility] [address], [place], [place] [address]    Culture   Final    NO GROWTH 2 DAYS Performed at [facility] [address] [place], [place] [address]    Report Status PENDING  Incomplete  Urine culture     Status: None   Collection Time: 11/11/18  4:51 PM   Specimen: In/Out Cath Urine  Result Value Ref Range Status   Specimen Description   Final    IN/OUT CATH  URINE Performed at [facility] [address], [place], [place] [address]    Special Requests   Final    NONE Performed at [facility], [place] [address], [place], [place] [address]    Culture   Final    NO GROWTH Performed at [facility], [place] [address] [place], [place] [address]    Report Status 11/12/2018 FINAL  Final  MRSA PCR Screening     Status: None   Collection Time: 11/12/18  5:10 AM   Specimen: Nasal Mucosa; Nasopharyngeal  Result Value Ref Range Status   MRSA by PCR NEGATIVE NEGATIVE Final    Comment:        The GeneXpert MRSA Assay (FDA approved for NASAL specimens only), is one component of a comprehensive MRSA colonization surveillance program. It is not intended to diagnose MRSA infection nor to guide or monitor treatment for MRSA infections. Performed at [name], [place] [address], [place], [place] [address]          Radiology Studies: US Renal  Result Date: 11/11/2018 CLINICAL DATA:  Acute renal failure EXAM: RENAL / URINARY TRACT ULTRASOUND COMPLETE COMPARISON:  09/26/2017 FINDINGS: Right Kidney: Renal measurements: 13.1 x 6.1 x 6.0 cm. = volume: 253 mL . Echogenicity within normal limits. No mass or hydronephrosis visualized. Left Kidney: Renal measurements: 6.8 x 2.7 x 2.2 cm = volume: 21 mL. Cortical thinning and increased echogenicity is  noted when compared with the right kidney. These changes are stable from the prior CT examination. Bladder: Appears normal for degree of bladder distention. IMPRESSION: Left renal atrophy unchanged from prior CT examination. Normal-appearing right kidney. Electronically Signed   By: Inez Catalina M.D.   On: 11/11/2018 16:04   Dg Pelvis Portable  Result Date: 11/11/2018 CLINICAL DATA:  Multiple falls. EXAM: PORTABLE PELVIS 1-2 VIEWS COMPARISON:  None. FINDINGS: There is no evidence of pelvic fracture or diastasis. No pelvic bone lesions are seen.  IMPRESSION: Negative. Electronically Signed   By: Marijo Conception M.D.   On: 11/11/2018 13:48        Scheduled Meds: . apixaban  2.5 mg Oral BID  . aspirin  81 mg Oral Daily  . atorvastatin  80 mg Oral q1800  . cholestyramine  4 g Oral Daily  . ferrous sulfate  325 mg Oral Q breakfast  . folic acid  0.5 mg Oral Daily  . gabapentin  100 mg Oral Daily  . mouth rinse  15 mL Mouth Rinse BID  . mirtazapine  7.5 mg Oral QHS  . pantoprazole  40 mg Oral Daily  . potassium chloride  20 mEq Oral BID  . sertraline  100 mg Oral Daily  . tamsulosin  0.4 mg Oral Daily   Continuous Infusions: . potassium chloride 10 mEq (11/13/18 1233)  .  sodium bicarbonate  infusion 1000 mL 75 mL/hr at 11/13/18 1231     LOS: 2 days    Time spent: 35 minutes spent in the coordination of care today.    Jonnie Finner, DO [facility] Pager [phone]  If 7PM-7AM, please contact night-coverage [email or web address] Password [email or web address] 11/13/2018, 1:14 PM

## 2018-11-13 NOTE — Progress Notes (Addendum)
[facility] Progress Note  Subjective: feeling better, no new c/o.  600 cc UOP yest, +3L total/ net.   K+ down 2.7.  Creat 8.40 today, not much better.  BP's better yest 113/50 . On Room air at 100%.    Vitals:   11/12/18 0302 11/12/18 1620 11/12/18 2131 11/13/18 0555  BP: 135/65 (!) 107/53 (!) 111/54 (!) 113/50  Pulse: 83 61 65 60  Resp: 16 20 20 18   Temp: 97.7 F (36.5 C) 97.9 F (36.6 C) 98.7 F (37.1 C) 98.8 F (37.1 C)  TempSrc: Oral Oral Oral Oral  SpO2: 99% 97% 98% 100%  Weight: 86 kg     Height: 5\' 7"  (1.702 m)       Inpatient medications: . apixaban  2.5 mg Oral BID  . aspirin  81 mg Oral Daily  . atorvastatin  80 mg Oral q1800  . cholestyramine  4 g Oral Daily  . ferrous sulfate  325 mg Oral Q breakfast  . folic acid  0.5 mg Oral Daily  . gabapentin  100 mg Oral Daily  . mouth rinse  15 mL Mouth Rinse BID  . metoprolol tartrate  12.5 mg Oral BID  . mirtazapine  7.5 mg Oral QHS  . pantoprazole  40 mg Oral Daily  . potassium chloride  20 mEq Oral BID  . sertraline  100 mg Oral Daily  . tamsulosin  0.4 mg Oral Daily   . potassium chloride 10 mEq (11/13/18 1058)  .  sodium bicarbonate  infusion 1000 mL 125 mL/hr at 11/13/18 0600   acetaminophen **OR** acetaminophen, albuterol, diphenoxylate-atropine, ondansetron **OR** ondansetron (ZOFRAN) IV    Exam: Gen chron ill appearing, lethargic but arouses and answers most quetsions No rash, cyanosis or gangrene Sclera anicteric, throat is quite dry  No jvd or bruits Chest clear bilat to bases RRR no MRG Abd soft ntnd no mass or ascites, midline dressing of wound not removed, RLQ ostomy intact GU normal male w/ foley cath MS no joint effusions or deformity Ext trace pretib edema, no wounds or ulcers Neuro is awake, Ox 3 , L arm paresis    Home meds:  - apixaban 2.5 bid  - atorvastatin 80 qd/ aspirin 81/ cholestyramine 4g qd  - tamsulosin 0.4/ omeprazole 40 qd  - sertraline 100 qd/  mirtazapine 7.5 hs/ gabapentin 100 qd  - O2 2L [place]  - metoprolol 12.5 bid  - prn's/ vitamins/ supplements   CXR > IMPRESSION: Left base atelectasis or early infiltrate.  Renal US > 13cm R kidney, no hydro, +^echo.  6.8cm L kidney, cortical thinning and ^^echo, no hydro.   ECHO: LVEF 60%, MV regurg appears to have worsened but images are poor  UA 7.22 > few bact, 21-50 rbc and 6- 10 wbc, prot neg  UNa 14,  UCr 116    Assessment/ Plan: 1. AKI on CKD3 - one kidney is atrophic and prob not functional at all (pt states has "only one kidney").   Hypotension/ vol depletion most likely cause of AKI, sepsis could be another, although cx's are negative. Baseline creat is 1.4.  Creat down minimally today, not sure why not improving more. DC any BP lowering meds. Will lower IVF's slightly.  Mental status is better, no urgent need for dialysis but if renal fxn doesn't improve may have address possibility of RRT.   2. Hypokalemia - replace IV and po 3. HTN - BP's on low side, will dc metoprolol , letting BP's run high  is better than low w/ AKI.  4. Chron abd wound 5. Pyuria - cx's negative 6. H/o CVA w/ left sided weakness 7. Chronic O2 nasal cannula 8. Chronic debility / SNF dependence - says he was "walking prior to  3 wks ago"    [facility] 11/13/2018, 11:41 AM  Iron/TIBC/Ferritin/ %Sat    Component Value Date/Time   IRON 11 (L) 10/09/2017 0500   TIBC 151 (L) 10/09/2017 0500   FERRITIN 519 (H) 10/09/2017 0500   IRONPCTSAT 7 (L) 10/09/2017 0500   Recent Labs  Lab 11/13/18 0430  NA 141  K 2.7*  CL 107  CO2 16*  GLUCOSE 109*  BUN 79*  CREATININE 8.40*  CALCIUM 6.8*  ALBUMIN 2.3*   Recent Labs  Lab 11/13/18 0430  AST 25  ALT 11  ALKPHOS 63  BILITOT 0.5  PROT 5.0*   Recent Labs  Lab 11/13/18 0430  WBC 7.7  HGB 8.4*  HCT 25.6*  PLT 165

## 2018-11-14 DIAGNOSIS — E872 Acidosis: Secondary | ICD-10-CM

## 2018-11-14 LAB — RENAL FUNCTION PANEL
Albumin: 2.5 g/dL — ABNORMAL LOW (ref 3.5–5.0)
Anion gap: 12 (ref 5–15)
BUN: 80 mg/dL — ABNORMAL HIGH (ref 8–23)
CO2: 19 mmol/L — ABNORMAL LOW (ref 22–32)
Calcium: 6.8 mg/dL — ABNORMAL LOW (ref 8.9–10.3)
Chloride: 109 mmol/L (ref 98–111)
Creatinine, Ser: 7.71 mg/dL — ABNORMAL HIGH (ref 0.61–1.24)
GFR calc Af Amer: 8 mL/min — ABNORMAL LOW (ref >60)
GFR calc non Af Amer: 7 mL/min — ABNORMAL LOW (ref >60)
Glucose, Bld: 106 mg/dL — ABNORMAL HIGH (ref 70–99)
Phosphorus: 7.2 mg/dL — ABNORMAL HIGH (ref 2.5–4.6)
Potassium: 3.2 mmol/L — ABNORMAL LOW (ref 3.5–5.1)
Sodium: 140 mmol/L (ref 135–145)

## 2018-11-14 LAB — CBC WITH DIFFERENTIAL/PLATELET
Abs Immature Granulocytes: 0.05 10*3/uL (ref 0.00–0.07)
Basophils Absolute: 0 10*3/uL (ref 0.0–0.1)
Basophils Relative: 0 %
Eosinophils Absolute: 0.3 10*3/uL (ref 0.0–0.5)
Eosinophils Relative: 3 %
HCT: 26.9 % — ABNORMAL LOW (ref 39.0–52.0)
Hemoglobin: 8.6 g/dL — ABNORMAL LOW (ref 13.0–17.0)
Immature Granulocytes: 1 %
Lymphocytes Relative: 9 %
Lymphs Abs: 0.7 10*3/uL (ref 0.7–4.0)
MCH: 28.3 pg (ref 26.0–34.0)
MCHC: 32 g/dL (ref 30.0–36.0)
MCV: 88.5 fL (ref 80.0–100.0)
Monocytes Absolute: 0.6 10*3/uL (ref 0.1–1.0)
Monocytes Relative: 8 %
Neutro Abs: 6.4 10*3/uL (ref 1.7–7.7)
Neutrophils Relative %: 79 %
Platelets: 152 10*3/uL (ref 150–400)
RBC: 3.04 MIL/uL — ABNORMAL LOW (ref 4.22–5.81)
RDW: 14.8 % (ref 11.5–15.5)
WBC: 8 10*3/uL (ref 4.0–10.5)
nRBC: 0 % (ref 0.0–0.2)

## 2018-11-14 LAB — MAGNESIUM: Magnesium: 1.7 mg/dL (ref 1.7–2.4)

## 2018-11-14 MED ORDER — FUROSEMIDE 10 MG/ML IJ SOLN
40.0000 mg | Freq: Once | INTRAMUSCULAR | Status: AC
  Administered 2018-11-14: 10:00:00 40 mg via INTRAVENOUS
  Filled 2018-11-14: qty 4

## 2018-11-14 MED ORDER — POTASSIUM CHLORIDE CRYS ER 20 MEQ PO TBCR
30.0000 meq | EXTENDED_RELEASE_TABLET | Freq: Two times a day (BID) | ORAL | Status: AC
  Administered 2018-11-14 (×2): 30 meq via ORAL
  Filled 2018-11-14 (×2): qty 1

## 2018-11-14 MED ORDER — SODIUM BICARBONATE 8.4 % IV SOLN
INTRAVENOUS | Status: DC
  Administered 2018-11-14 – 2018-11-16 (×3): via INTRAVENOUS
  Filled 2018-11-14 (×3): qty 50

## 2018-11-14 NOTE — Plan of Care (Signed)

## 2018-11-14 NOTE — Progress Notes (Signed)
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 fib, chronic left arm flaccid paralysis and long-term nursing home resident presenting to the emergency room with abnormal lab test, feeling weak for about a week. Patient is poor historian. Reviewed his medical records from previous hospitalization. Also reviewed records from nursing home. Patient himself tells that he stays out a lot so he might have gotten dehydrated. He denies any nausea or vomiting. He says his appetite is good. No loose stool noted in colostomy. Patient states that he is urinating but cannot tell me when was the last time he had any urine. Denies any retention of urine or abdominal pain. He does have chronic wound from abdominal wall, currently under wound care. Patient says he does not smoke, however nursing home reported that he goes out in the sun to smoke and he stays outside long time and it was very hot outside. Today morning, he was noted to be more and more weaker. Usually he would transfer from bed to chair and walk with support and now he can barely walk or barely stand. He also seem to be confused than normal. So they did a lab test today where his BMP showed creatinine of 9.47, last creatinine in March 2020 was 1.78. No reports of fever. No other resident would COVID-19 at the nursing home.   Assessment & Plan:   Principal Problem:   Acute renal failure (HCC) Active Problems:   HTN (hypertension)   CKD (chronic kidney disease) stage 3, GFR  30-59 ml/min (HCC)   HLD (hyperlipidemia)   Elevated troponin   Left hemiparesis (HCC)   Acute encephalopathy   Acute kidney injury (AKI) with acute tubular necrosis (ATN) (HCC)   Acute renal failure with probable underlying chronic kidney disease stage III: - etiology unknown - s/p fluid bolus and now on bicarb gtt; HCO3- up to 10 this morning - Neprhology consulted, will see today - foley in place - SCr 9.4 at admission; down to 8.4 - continue bicarb for now; follow up UOP     - Bicarb up to 19, SCr improved to 7.7; appreciate nephro help  Elevated troponins - With significant acute kidney injury.  - Denies any chest pain/EKG is nonischemic. 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  - CT head is normal; continue to monitor. - PT/OT prior to discharge     - PT recs noted  Hypertension - continue metoprolol with holding parameters.  Acute metabolic encephalopathy - Suspect due to #1; Will need continuous monitoring and treatment for the cause.     - he's A&O x 3, appropriate interaction  SIRS due to noninfectious cause:  - Blood pressures are stable.  - Lactic acid was normal.  - No localizing infection on clinical examination.  - Patient is a stabilizing.  - He was given loading dose of broad-spectrum antibiotics in the emergency room. Given severe renal failure and alternative explanation of the disease, will hold further antibiotics. - procal is negative; lactic  acid remains normal     - question is he really this dehydrated; looks good clinically, continuing fluids; no abx at this time.  History of PE:  - On Eliquis.   Hx of midline abdominal wound - WOCN; continue current care  Hypokalemia     - replete K+, monitor  Looking better  today clinically and labwise. UOP is picking up. Drop rate on fluids. Got a OT dose lasix this AM. He's getting turned around. Appreciate nephro assistance. Continue as above.     DVT prophylaxis:eliquis Code Status:FULL Disposition Plan:TBD   Consultants:  Nephrology   Subjective: "I like cars. My best car was a [place]."  Objective: Vitals:   11/13/18 2201 11/14/18 0104 11/14/18 0500 11/14/18 1446  BP: 123/65  122/64 129/64  Pulse: 65  64 64  Resp: 18  18 16   Temp: 98.7 F (37.1 C)  98 F (36.7 C) 98.3 F (36.8 C)  TempSrc: Oral  Oral Oral  SpO2: 98% 98% 98% 100%  Weight:   88.4 kg   Height:        Intake/Output Summary (Last 24 hours) at 11/14/2018 1550 Last data filed at 11/14/2018 1500 Gross per 24 hour  Intake 1377.81 ml  Output 2100 ml  Net -722.19 ml   Filed Weights   11/12/18 0302 11/14/18 0500  Weight: 86 kg 88.4 kg    Examination:  General: 68 y.o. male resting in bed in NAD Cardiovascular: RRR, +S1, S2, no m/g/r, equal pulses throughout Respiratory: CTABL, no w/r/r, normal WOB GI: BS+, NDNT, no masses noted, no organomegaly noted, ostomy noted, midline wound bandaging CDI MSK: No e/c/c Skin: No rashes, bruises, ulcerations noted Neuro: A&O x 3, follows commands Psyc: Appropriate interaction and affect, calm/cooperative    Data Reviewed: I have personally reviewed following labs and imaging studies.  CBC: Recent Labs  Lab 11/11/18 1304 11/12/18 0510 11/13/18 0430 11/14/18 0519  WBC 9.9 8.3 7.7 8.0  NEUTROABS 8.5*  --   --  6.4  HGB 10.5* 9.5* 8.4* 8.6*  HCT 33.7* 30.1* 25.6* 26.9*  MCV 90.1 87.2 87.7 88.5  PLT 190 169 165 223   Basic Metabolic Panel: Recent Labs  Lab 11/11/18 1304 11/12/18 0510 11/13/18 0430 11/14/18 0519  NA 137 141 141 140  K 4.8 4.0 2.7* 3.2*  CL 114* 116* 107 109  CO2 <7* 10* 16* 19*  GLUCOSE 79 91 109* 106*  BUN 89* 84* 79* 80*  CREATININE 9.44* 8.47* 8.40* 7.71*  CALCIUM 8.2* 7.4* 6.8* 6.8*   MG  --   --   --  1.7  PHOS  --   --   --  7.2*   GFR: Estimated Creatinine Clearance: 9.7 mL/min (A) (by C-G formula based on SCr of 7.71 mg/dL (H)). Liver Function Tests: Recent Labs  Lab 11/11/18 1304 11/12/18 0510 11/13/18 0430 11/14/18 0519  AST 18 22 25   --   ALT 15 13 11   --   ALKPHOS 99 83 63  --   BILITOT 0.5 0.5 0.5  --   PROT 6.5 5.7* 5.0*  --   ALBUMIN 3.1* 2.7* 2.3* 2.5*   No results for input(s): LIPASE, AMYLASE in the last 168 hours. No results for input(s): AMMONIA in the last 168 hours. Coagulation Profile: No results for input(s): INR, PROTIME in the last 168 hours. Cardiac Enzymes: Recent Labs  Lab 11/11/18 1301  CKTOTAL 782*   BNP (last 3 results) No results for input(s): PROBNP in the last 8760  hours. HbA1C: No results for input(s): HGBA1C in the last 72 hours. CBG: No results for input(s): GLUCAP in the last 168 hours. Lipid Profile: No results for input(s): CHOL, HDL, LDLCALC, TRIG, CHOLHDL, LDLDIRECT in the last 72 hours. Thyroid Function Tests: Recent Labs    11/12/18 0510  TSH 1.485   Anemia Panel: No results for input(s): VITAMINB12, FOLATE, FERRITIN, TIBC, IRON, RETICCTPCT in the last 72 hours. Sepsis Labs: Recent Labs  Lab 11/11/18 1304 11/11/18 1450 11/11/18 1556 11/12/18 0851 11/13/18 0430  PROCALCITON  --   --  <0.10 <0.10 <0.10  LATICACIDVEN 0.6 0.5  --   --   --     Recent Results (from the past 240 hour(s))  Culture, blood (routine x 2)     Status: None (Preliminary result)   Collection Time: 11/11/18  1:04 PM   Specimen: BLOOD RIGHT FOREARM  Result Value Ref Range Status   Specimen Description   Final    BLOOD RIGHT FOREARM Performed at [facility], [place] [address], [place], [place] [address]    Special Requests   Final    BOTTLES DRAWN AEROBIC ONLY Blood Culture results may not be optimal due to an inadequate volume of blood received in culture bottles Performed at [facility]  [address] [place], [place] [address]    Culture   Final    NO GROWTH 3 DAYS Performed at [facility], [place] [address], [place], [place] [address]    Report Status PENDING  Incomplete  SARS Coronavirus 2 (CEPHEID - Performed in [place] hospital lab), Hosp Order     Status: None   Collection Time: 11/11/18  1:04 PM   Specimen: Nasopharyngeal Swab  Result Value Ref Range Status   SARS Coronavirus 2 NEGATIVE NEGATIVE Final    Comment: (NOTE) If result is NEGATIVE SARS-CoV-2 target nucleic acids are NOT DETECTED. The SARS-CoV-2 RNA is generally detectable in upper and lower  respiratory specimens during the acute phase of infection. The lowest  concentration of SARS-CoV-2 viral copies this assay can detect is 250  copies / mL. A negative result does not preclude SARS-CoV-2 infection  and should not be used as the sole basis for treatment or other  patient management decisions.  A negative result may occur with  improper specimen collection / handling, submission of specimen other  than nasopharyngeal swab, presence of viral mutation(s) within the  areas targeted by this assay, and inadequate number of viral copies  (<250 copies / mL). A negative result must be combined with clinical  observations, patient history, and epidemiological information. If result is POSITIVE SARS-CoV-2 target nucleic acids are DETECTED. The SARS-CoV-2 RNA is generally detectable in upper and lower  respiratory specimens dur ing the acute phase of infection.  Positive  results are indicative of active infection with SARS-CoV-2.  Clinical  correlation with patient history and other diagnostic information is  necessary to determine patient infection status.  Positive results do  not rule out bacterial infection or co-infection with other viruses. If result is PRESUMPTIVE POSTIVE SARS-CoV-2 nucleic acids MAY BE PRESENT.   A presumptive positive result was obtained on the submitted specimen  and  confirmed on repeat testing.  While 2019 novel coronavirus  (SARS-CoV-2) nucleic acids may be present in the submitted sample  additional confirmatory testing may be necessary for epidemiological  and / or clinical management purposes  to differentiate between  SARS-CoV-2 and other Sarbecovirus currently known to infect humans.  If clinically indicated additional testing with an alternate test  methodology [phone]) is advised. The SARS-CoV-2 RNA is generally  detectable in upper and lower respiratory sp ecimens during the acute  phase of infection. The expected result is Negative. Fact Sheet for Patients:  StrictlyIdeas.no Fact Sheet for Healthcare Providers: BankingDealers.co.za This test is not yet approved or cleared by the [place] FDA and has been authorized for detection and/or diagnosis of SARS-CoV-2 by FDA under an Emergency Use Authorization (EUA).  This EUA will remain in effect (meaning this test can be used) for the duration of the COVID-19 declaration under Section 564(b)(1) of the Act, 21 U.S.C. section 360bbb-3(b)(1), unless the authorization is terminated or revoked sooner. Performed at [facility], [place] [address] [place], [place] [address]   Culture, blood (routine x 2)     Status: None (Preliminary result)   Collection Time: 11/11/18  4:51 PM   Specimen: BLOOD  Result Value Ref Range Status   Specimen Description   Final    BLOOD RIGHT ANTECUBITAL Performed at [facility] [address], [place], [place] [address]    Special Requests   Final    BOTTLES DRAWN AEROBIC AND ANAEROBIC Blood Culture adequate volume Performed at [facility] [address] [place], [place] [address]    Culture   Final    NO GROWTH 3 DAYS Performed at [facility], [place] [address] [place], [place] [address]    Report Status PENDING  Incomplete  Urine culture      Status: None   Collection Time: 11/11/18  4:51 PM   Specimen: In/Out Cath Urine  Result Value Ref Range Status   Specimen Description   Final    IN/OUT CATH URINE Performed at [facility] [address] [place], [place] [address]    Special Requests   Final    NONE Performed at [facility], [place] [address] [place], [place] [address]    Culture   Final    NO GROWTH Performed at [facility], [place] [address], [place], [place] [address]    Report Status 11/12/2018 FINAL  Final  MRSA PCR Screening     Status: None   Collection Time: 11/12/18  5:10 AM   Specimen: Nasal Mucosa; Nasopharyngeal  Result Value Ref Range Status   MRSA by PCR NEGATIVE NEGATIVE Final    Comment:        The GeneXpert MRSA Assay (FDA approved for NASAL specimens only), is one component of a comprehensive MRSA colonization surveillance program. It is not intended to diagnose MRSA infection nor to guide or monitor treatment for MRSA infections. Performed at [facility], [place] [address] [place],  [address]          Radiology Studies: No results found.      Scheduled Meds: . apixaban  2.5 mg Oral BID  . aspirin  81 mg Oral Daily  . atorvastatin  80 mg Oral q1800  . cholestyramine  4 g Oral Daily  . ferrous sulfate  325 mg Oral Q breakfast  . folic acid  0.5 mg Oral Daily  . gabapentin  100 mg Oral Daily  . mouth rinse  15 mL Mouth Rinse BID  . mirtazapine  7.5 mg Oral QHS  . pantoprazole  40 mg Oral Daily  . potassium chloride  30 mEq Oral BID  . sertraline  100 mg Oral Daily  . tamsulosin  0.4 mg Oral Daily   Continuous Infusions: .  sodium bicarbonate  infusion 1000 mL 50 mL/hr at 11/14/18 1500     LOS: 3 days    Time spent: 25 minutes spent in the coordination of care today.    Jonnie Finner, DO [facility] Pager [phone]  If 7PM-7AM, please contact night-coverage [email or web address] Password  [email or web address] 11/14/2018, 3:50 PM

## 2018-11-15 LAB — BASIC METABOLIC PANEL
Anion gap: 14 (ref 5–15)
BUN: 73 mg/dL — ABNORMAL HIGH (ref 8–23)
CO2: 20 mmol/L — ABNORMAL LOW (ref 22–32)
Calcium: 7.5 mg/dL — ABNORMAL LOW (ref 8.9–10.3)
Chloride: 106 mmol/L (ref 98–111)
Creatinine, Ser: 7.26 mg/dL — ABNORMAL HIGH (ref 0.61–1.24)
GFR calc Af Amer: 8 mL/min — ABNORMAL LOW (ref >60)
GFR calc non Af Amer: 7 mL/min — ABNORMAL LOW (ref >60)
Glucose, Bld: 125 mg/dL — ABNORMAL HIGH (ref 70–99)
Potassium: 3.8 mmol/L (ref 3.5–5.1)
Sodium: 140 mmol/L (ref 135–145)

## 2018-11-15 MED ORDER — MELATONIN 3 MG PO TABS
6.0000 mg | ORAL_TABLET | Freq: Every day | ORAL | Status: AC
  Administered 2018-11-15 – 2018-11-19 (×5): 6 mg via ORAL
  Filled 2018-11-15 (×5): qty 2

## 2018-11-15 NOTE — Progress Notes (Signed)
[facility] Progress Note  Subjective: no new c/o's.  Is noting swelling in his arms. 750 cc UOP yesterady. No SOB.   Vitals:   11/14/18 1446 11/14/18 2110 11/15/18 0542 11/15/18 1311  BP: 129/64 111/73 (!) 144/74 129/61  Pulse: 64 (!) 55 (!) 54 69  Resp: 16 19 19 16   Temp: 98.3 F (36.8 C) 97.7 F (36.5 C) 97.7 F (36.5 C) 98.4 F (36.9 C)  TempSrc: Oral Oral Oral Oral  SpO2: 100% 100% 100% 99%  Weight:   86.9 kg   Height:        Inpatient medications: . apixaban  2.5 mg Oral BID  . aspirin  81 mg Oral Daily  . atorvastatin  80 mg Oral q1800  . cholestyramine  4 g Oral Daily  . ferrous sulfate  325 mg Oral Q breakfast  . folic acid  0.5 mg Oral Daily  . gabapentin  100 mg Oral Daily  . mouth rinse  15 mL Mouth Rinse BID  . Melatonin  6 mg Oral QHS  . mirtazapine  7.5 mg Oral QHS  . pantoprazole  40 mg Oral Daily  . sertraline  100 mg Oral Daily  . tamsulosin  0.4 mg Oral Daily   .  sodium bicarbonate  infusion 1000 mL 50 mL/hr at 11/15/18 2130   acetaminophen **OR** acetaminophen, albuterol, diphenoxylate-atropine, ondansetron **OR** ondansetron (ZOFRAN) IV    Exam: Gen chron ill appearing, alert and pleasant No rash, cyanosis or gangrene Sclera anicteric, throat is quite dry  No jvd or bruits Chest clear bilat to bases RRR no MRG Abd soft ntnd no mass or ascites, midline dressing of wound not removed, RLQ ostomy intact GU normal male w/ foley cath, hypospadias MS no joint effusions or deformity Ext mild-mod pretib edema Neuro is awake, Ox 3 , L arm paresis    Home meds:  - apixaban 2.5 bid  - atorvastatin 80 qd/ aspirin 81/ cholestyramine 4g qd  - tamsulosin 0.4/ omeprazole 40 qd  - sertraline 100 qd/ mirtazapine 7.5 hs/ gabapentin 100 qd  - O2 2L [place]  - metoprolol 12.5 bid  - prn's/ vitamins/ supplements   CXR > 7/22 IMPRESSION: Left base atelectasis or early infiltrate.  Renal US > 13cm R kidney, no hydro, +^echo.  6.8cm L kidney,  cortical thinning and ^^echo, no hydro.   ECHO: LVEF 60%, MV regurg appears to have worsened but images are poor  UA 7.22 > few bact, 21-50 rbc and 6- 10 wbc, prot neg  UNa 14,  UCr 116    Assessment/ Plan: 1. AKI on CKD3 - one kidney is atrophic and prob not functional at all.    Hypotension/ vol depletion most likely cause of AKI, sepsis could be another, although cx's are negative. Baseline creat is 1.4. Peak creat here 9.4 on admission, down to 7.2 today. Cont IVF at 50/ hr, supportive care. Marginal long-term HD candidate, would be best if recovers w/o RRT.   2. Hypokalemia - replaced IV and po 3. HTN - BP's soft, dc'd metoprolol yest 4. Chron abd wound 5. Pyuria - cx's negative 6. H/o CVA w/ left sided weakness 7. Chronic O2 nasal cannula 8. Chronic debility / SNF dependence - hx of CVA , not sure baseline mobility    [facility] 11/14/2018, 8:09 AM  Iron/TIBC/Ferritin/ %Sat    Component Value Date/Time   IRON 11 (L) 10/09/2017 0500   TIBC 151 (L) 10/09/2017 0500   FERRITIN 519 (  H) 10/09/2017 0500   IRONPCTSAT 7 (L) 10/09/2017 0500   Recent Labs  Lab 11/14/18 0519 11/15/18 0849  NA 140 140  K 3.2* 3.8  CL 109 106  CO2 19* 20*  GLUCOSE 106* 125*  BUN 80* 73*  CREATININE 7.71* 7.26*  CALCIUM 6.8* 7.5*  PHOS 7.2*  --   ALBUMIN 2.5*  --    Recent Labs  Lab 11/13/18 0430  AST 25  ALT 11  ALKPHOS 63  BILITOT 0.5  PROT 5.0*   Recent Labs  Lab 11/14/18 0519  WBC 8.0  HGB 8.6*  HCT 26.9*  PLT 152

## 2018-11-15 NOTE — Progress Notes (Signed)
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 fib, chronic left arm flaccid paralysis and long-term nursing home resident presenting to the emergency room with abnormal lab test, feeling weak for about a week. Patient is poor historian. Reviewed his medical records from previous hospitalization. Also reviewed records from nursing home. Patient himself tells that he stays out a lot so he might have gotten dehydrated. He denies any nausea or vomiting. He says his appetite is good. No loose stool noted in colostomy. Patient states that he is urinating but cannot tell me when was the last time he had any urine. Denies any retention of urine or abdominal pain. He does have chronic wound from abdominal wall, currently under wound care. Patient says he does not smoke, however nursing home reported that he goes out in the sun to smoke and he stays outside long time and it was very hot outside. Today morning, he was noted to be more and more weaker. Usually he would transfer from bed to chair and walk with support and now he can barely walk or barely stand. He also seem to be confused than normal. So they did a lab test today where his BMP showed creatinine of 9.47, last creatinine in March 2020 was 1.78. No reports of fever. No other resident would COVID-19 at the nursing home.   Assessment & Plan:   Principal Problem:   Acute renal failure (HCC) Active Problems:   HTN (hypertension)   CKD (chronic kidney disease) stage 3, GFR  30-59 ml/min (HCC)   HLD (hyperlipidemia)   Elevated troponin   Left hemiparesis (HCC)   Acute encephalopathy   Acute kidney injury (AKI) with acute tubular necrosis (ATN) (HCC)   Acute renal failure with probable underlying chronic kidney disease stage III: - etiology unknown - s/p fluid bolus and now on bicarb gtt; HCO3- up to 10 this morning - Neprhology consulted, will see today - foley in place - SCr 9.4 at admission; down to 8.4 - continue bicarb for now; follow up UOP - Bicarb up to 20, SCr improved to 7.2; appreciate nephro help     - ostomy output is picking up; on cholestyramine   Elevated troponins - With significant acute kidney injury.  - Denies any chest pain/EKG is nonischemic. 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  - CT head is normal; continue to monitor. - PT/OT prior to discharge - PT recs noted  Hypertension     - metoprolol held; BPs ok  Acute metabolic encephalopathy - Suspect due to #1; Will need continuous monitoring and treatment for the cause. - he's A&O x 3, appropriate interaction  SIRS due to noninfectious cause:  - Blood pressures are stable.  - Lactic acid was normal.  - No localizing infection on clinical examination.  - Patient is a stabilizing.  - He was given loading dose of broad-spectrum antibiotics in the emergency room. Given severe renal failure and alternative explanation of the disease, will hold further antibiotics. - procal is negative; lactic  acid remains normal - question is he really this dehydrated; looks good clinically, continuing fluids; no abx at this time.  History of PE:  - On Eliquis.   Hx of midline abdominal wound - WOCN; continue current care  Hypokalemia     -  replete K+, monitor  Both UOP and ostomy outpt up. Continuing bicarb gtt. SCr moving, albeit, slowly No much to change today.  DVT prophylaxis:eliquis Code Status:FULL Disposition Plan:TBD   Consultants:  Nephrology   Subjective: "I'll be here."  Objective: Vitals:   11/14/18 1446 11/14/18 2110 11/15/18 0542 11/15/18 1311  BP: 129/64 111/73 (!) 144/74 129/61  Pulse: 64 (!) 55 (!) 54 69  Resp: 16 19 19 16   Temp: 98.3 F (36.8 C) 97.7 F (36.5 C) 97.7 F (36.5 C) 98.4 F (36.9 C)  TempSrc: Oral Oral Oral Oral  SpO2: 100% 100% 100% 99%  Weight:   86.9 kg   Height:        Intake/Output Summary (Last 24 hours) at 11/15/2018 1440 Last data filed at 11/15/2018 1311 Gross per 24 hour  Intake 507.81 ml  Output 4350 ml  Net -3842.19 ml   Filed Weights   11/12/18 0302 11/14/18 0500 11/15/18 0542  Weight: 86 kg 88.4 kg 86.9 kg    Examination:  General:68 y.o.maleresting in bed in NAD Cardiovascular: RRR, +S1, S2, no m/g/r, equal pulses throughout Respiratory: CTABL, no w/r/r, normal WOB GI: BS+, NDNT, no masses noted, no organomegaly noted, ostomy noted, midline wound bandaging CDI MSK: No e/c/c Skin: No rashes, bruises, ulcerations noted Neuro: A&O x 3,follows commands Psyc: Appropriate interaction and affect, calm/cooperative    Data Reviewed: I have personally reviewed following labs and imaging studies.  CBC: Recent Labs  Lab 11/11/18 1304 11/12/18 0510 11/13/18 0430 11/14/18 0519  WBC 9.9 8.3 7.7 8.0  NEUTROABS 8.5*  --   --  6.4  HGB 10.5* 9.5* 8.4* 8.6*  HCT 33.7* 30.1* 25.6* 26.9*  MCV 90.1 87.2 87.7 88.5  PLT 190 169 165 166   Basic Metabolic Panel: Recent Labs  Lab 11/11/18 1304 11/12/18 0510 11/13/18 0430 11/14/18 0519 11/15/18 0849  NA 137 141 141 140 140  K 4.8 4.0 2.7* 3.2* 3.8  CL 114* 116* 107 109 106  CO2 <7* 10* 16* 19* 20*  GLUCOSE 79 91 109* 106* 125*  BUN 89* 84* 79* 80* 73*  CREATININE 9.44* 8.47* 8.40*  7.71* 7.26*  CALCIUM 8.2* 7.4* 6.8* 6.8* 7.5*  MG  --   --   --  1.7  --   PHOS  --   --   --  7.2*  --    GFR: Estimated Creatinine Clearance: 10.2 mL/min (A) (by C-G formula based on SCr of 7.26 mg/dL (H)). Liver Function Tests: Recent Labs  Lab 11/11/18 1304 11/12/18 0510 11/13/18 0430 11/14/18 0519  AST 18 22 25   --   ALT 15 13 11   --   ALKPHOS 99 83 63  --   BILITOT 0.5 0.5 0.5  --   PROT 6.5 5.7* 5.0*  --   ALBUMIN 3.1* 2.7* 2.3* 2.5*   No results for input(s): LIPASE, AMYLASE in the last 168 hours. No results for input(s): AMMONIA in the last 168 hours. Coagulation Profile: No results for input(s): INR, PROTIME in the last 168 hours. Cardiac Enzymes: Recent Labs  Lab 11/11/18 1301  CKTOTAL 782*   BNP (last 3 results) No results for input(s): PROBNP in the last 8760 hours. HbA1C: No results for  input(s): HGBA1C in the last 72 hours. CBG: No results for input(s): GLUCAP in the last 168 hours. Lipid Profile: No results for input(s): CHOL, HDL, LDLCALC, TRIG, CHOLHDL, LDLDIRECT in the last 72 hours. Thyroid Function Tests: No results for input(s): TSH, T4TOTAL, FREET4, T3FREE, THYROIDAB in the last 72 hours. Anemia Panel: No results for input(s): VITAMINB12, FOLATE, FERRITIN, TIBC, IRON, RETICCTPCT in the last 72 hours. Sepsis Labs: Recent Labs  Lab 11/11/18 1304 11/11/18 1450 11/11/18 1556 11/12/18 0851 11/13/18 0430  PROCALCITON  --   --  <0.10 <0.10 <0.10  LATICACIDVEN 0.6 0.5  --   --   --     Recent Results (from the past 240 hour(s))  Culture, blood (routine x 2)     Status: None (Preliminary result)   Collection Time: 11/11/18  1:04 PM   Specimen: BLOOD RIGHT FOREARM  Result Value Ref Range Status   Specimen Description   Final    BLOOD RIGHT FOREARM Performed at [facility], [place] [address] [place], [place] [address]    Special Requests   Final    BOTTLES DRAWN AEROBIC ONLY Blood Culture results may not be optimal due to an inadequate  volume of blood received in culture bottles Performed at [place] [address], [place], [place] [address]    Culture   Final    NO GROWTH 4 DAYS Performed at [facility], [place] [address] [place], [place] [address]    Report Status PENDING  Incomplete  SARS Coronavirus 2 (CEPHEID - Performed in [place] hospital lab), Hosp Order     Status: None   Collection Time: 11/11/18  1:04 PM   Specimen: Nasopharyngeal Swab  Result Value Ref Range Status   SARS Coronavirus 2 NEGATIVE NEGATIVE Final    Comment: (NOTE) If result is NEGATIVE SARS-CoV-2 target nucleic acids are NOT DETECTED. The SARS-CoV-2 RNA is generally detectable in upper and lower  respiratory specimens during the acute phase of infection. The lowest  concentration of SARS-CoV-2 viral copies this assay can detect is 250  copies / mL. A negative result does not preclude SARS-CoV-2 infection  and should not be used as the sole basis for treatment or other  patient management decisions.  A negative result may occur with  improper specimen collection / handling, submission of specimen other  than nasopharyngeal swab, presence of viral mutation(s) within the  areas targeted by this assay, and inadequate number of viral copies  (<250 copies / mL). A negative result must be combined with clinical  observations, patient history, and epidemiological information. If result is POSITIVE SARS-CoV-2 target nucleic acids are DETECTED. The SARS-CoV-2 RNA is generally detectable in upper and lower  respiratory specimens dur ing the acute phase of infection.  Positive  results are indicative of active infection with SARS-CoV-2.  Clinical  correlation with patient history and other diagnostic information is  necessary to determine patient infection status.  Positive results do  not rule out bacterial infection or co-infection with other viruses. If result is PRESUMPTIVE POSTIVE SARS-CoV-2 nucleic acids MAY  BE PRESENT.   A presumptive positive result was obtained on the submitted specimen  and confirmed on repeat testing.  While 2019 novel coronavirus  (SARS-CoV-2) nucleic acids may be present in the submitted sample  additional confirmatory testing may be necessary for epidemiological  and / or clinical management purposes  to differentiate between  SARS-CoV-2 and other Sarbecovirus currently known to infect humans.  If clinically  indicated additional testing with an alternate test  methodology [phone]) is advised. The SARS-CoV-2 RNA is generally  detectable in upper and lower respiratory sp ecimens during the acute  phase of infection. The expected result is Negative. Fact Sheet for Patients:  StrictlyIdeas.no Fact Sheet for Healthcare Providers: BankingDealers.co.za This test is not yet approved or cleared by the [place] FDA and has been authorized for detection and/or diagnosis of SARS-CoV-2 by FDA under an Emergency Use Authorization (EUA).  This EUA will remain in effect (meaning this test can be used) for the duration of the COVID-19 declaration under Section 564(b)(1) of the Act, 21 U.S.C. section 360bbb-3(b)(1), unless the authorization is terminated or revoked sooner. Performed at [facility], [place] [address], [place], [place] [address]   Culture, blood (routine x 2)     Status: None (Preliminary result)   Collection Time: 11/11/18  4:51 PM   Specimen: BLOOD  Result Value Ref Range Status   Specimen Description   Final    BLOOD RIGHT ANTECUBITAL Performed at [facility] [address] [place], [place] [address]    Special Requests   Final    BOTTLES DRAWN AEROBIC AND ANAEROBIC Blood Culture adequate volume Performed at [place] [address], [place], [place] [address]    Culture   Final    NO GROWTH 4 DAYS Performed at [facility], [place]  [address] [place], [place] [address]    Report Status PENDING  Incomplete  Urine culture     Status: None   Collection Time: 11/11/18  4:51 PM   Specimen: In/Out Cath Urine  Result Value Ref Range Status   Specimen Description   Final    IN/OUT CATH URINE Performed at [place] [address] [place], [place] [address]    Special Requests   Final    NONE Performed at [facility], [place] [address], [place], [place] [address]    Culture   Final    NO GROWTH Performed at [facility], [place] [address] [place], [place] [address]    Report Status 11/12/2018 FINAL  Final  MRSA PCR Screening     Status: None   Collection Time: 11/12/18  5:10 AM   Specimen: Nasal Mucosa; Nasopharyngeal  Result Value Ref Range Status   MRSA by PCR NEGATIVE NEGATIVE Final    Comment:        The GeneXpert MRSA Assay (FDA approved for NASAL specimens only), is one component of a comprehensive MRSA colonization surveillance program. It is not intended to diagnose MRSA infection nor to guide or monitor treatment for MRSA infections. Performed at [facility], [place] [address], [place],  [address]       Radiology Studies: No results found.   Scheduled Meds: . apixaban  2.5 mg Oral BID  . aspirin  81 mg Oral Daily  . atorvastatin  80 mg Oral q1800  . cholestyramine  4 g Oral Daily  . ferrous sulfate  325 mg Oral Q breakfast  . folic acid  0.5 mg Oral Daily  . gabapentin  100 mg Oral Daily  . mouth rinse  15 mL Mouth Rinse BID  . Melatonin  6 mg Oral QHS  . mirtazapine  7.5 mg Oral QHS  . pantoprazole  40 mg Oral Daily  . sertraline  100 mg Oral Daily  . tamsulosin  0.4 mg Oral Daily   Continuous Infusions: .  sodium bicarbonate  infusion 1000 mL 50 mL/hr at 11/15/18 0639     LOS: 4 days    Time spent: 25 minutes spent in the coordination of care today.    Jonnie Finner, DO [facility] Pager  [phone]  If 7PM-7AM, please contact night-coverage [email or web address] Password [email or web address] 11/15/2018, 2:40 PM

## 2018-11-15 NOTE — Progress Notes (Addendum)
[facility] Progress Note  Subjective: no new c/o's.  Is noting swelling in his arms. 750 cc UOP yesterady. No SOB.   Vitals:   11/14/18 0104 11/14/18 0500 11/14/18 1446 11/14/18 2110  BP:  122/64 129/64 111/73  Pulse:  64 64 (!) 55  Resp:  18 16 19   Temp:  98 F (36.7 C) 98.3 F (36.8 C) 97.7 F (36.5 C)  TempSrc:  Oral Oral Oral  SpO2: 98% 98% 100% 100%  Weight:  88.4 kg    Height:        Inpatient medications: . apixaban  2.5 mg Oral BID  . aspirin  81 mg Oral Daily  . atorvastatin  80 mg Oral q1800  . cholestyramine  4 g Oral Daily  . ferrous sulfate  325 mg Oral Q breakfast  . folic acid  0.5 mg Oral Daily  . gabapentin  100 mg Oral Daily  . mouth rinse  15 mL Mouth Rinse BID  . mirtazapine  7.5 mg Oral QHS  . pantoprazole  40 mg Oral Daily  . sertraline  100 mg Oral Daily  . tamsulosin  0.4 mg Oral Daily   .  sodium bicarbonate  infusion 1000 mL 50 mL/hr at 11/14/18 1500   acetaminophen **OR** acetaminophen, albuterol, diphenoxylate-atropine, ondansetron **OR** ondansetron (ZOFRAN) IV    Exam: Gen chron ill appearing, lethargic but arouses and answers most quetsions No rash, cyanosis or gangrene Sclera anicteric, throat is quite dry  No jvd or bruits Chest clear bilat to bases RRR no MRG Abd soft ntnd no mass or ascites, midline dressing of wound not removed, RLQ ostomy intact GU normal male w/ foley cath MS no joint effusions or deformity Ext trace pretib edema, no wounds or ulcers Neuro is awake, Ox 3 , L arm paresis    Home meds:  - apixaban 2.5 bid  - atorvastatin 80 qd/ aspirin 81/ cholestyramine 4g qd  - tamsulosin 0.4/ omeprazole 40 qd  - sertraline 100 qd/ mirtazapine 7.5 hs/ gabapentin 100 qd  - O2 2L [place]  - metoprolol 12.5 bid  - prn's/ vitamins/ supplements   CXR > 7/22 IMPRESSION: Left base atelectasis or early infiltrate.  Renal US > 13cm R kidney, no hydro, +^echo.  6.8cm L kidney, cortical thinning and ^^echo, no  hydro.   ECHO: LVEF 60%, MV regurg appears to have worsened but images are poor  UA 7.22 > few bact, 21-50 rbc and 6- 10 wbc, prot neg  UNa 14,  UCr 116    Assessment/ Plan: 1. AKI on CKD3 - one kidney is atrophic and prob not functional at all (pt states has "only one kidney").   Hypotension/ vol depletion most likely cause of AKI, sepsis could be another, although cx's are negative. Baseline creat is 1.4.  Slow improvement so far.  No indications for RRT.  Will decrease IVF's and give one dose IV lasix as pt becoming edematous.  2. Hypokalemia - replace IV and po 3. HTN - BP's soft, dc'd metoprolol yest 4. Chron abd wound 5. Pyuria - cx's negative 6. H/o CVA w/ left sided weakness 7. Chronic O2 nasal cannula 8. Chronic debility / SNF dependence - not sure baseline mobility    [facility] 11/14/2018, 8:09 AM  Iron/TIBC/Ferritin/ %Sat    Component Value Date/Time   IRON 11 (L) 10/09/2017 0500   TIBC 151 (L) 10/09/2017 0500   FERRITIN 519 (H) 10/09/2017 0500   IRONPCTSAT 7 (L) 10/09/2017 0500  Recent Labs  Lab 11/14/18 0519  NA 140  K 3.2*  CL 109  CO2 19*  GLUCOSE 106*  BUN 80*  CREATININE 7.71*  CALCIUM 6.8*  PHOS 7.2*  ALBUMIN 2.5*   Recent Labs  Lab 11/13/18 0430  AST 25  ALT 11  ALKPHOS 63  BILITOT 0.5  PROT 5.0*   Recent Labs  Lab 11/14/18 0519  WBC 8.0  HGB 8.6*  HCT 26.9*  PLT 152

## 2018-11-15 NOTE — Plan of Care (Signed)

## 2018-11-16 LAB — BASIC METABOLIC PANEL
Anion gap: 14 (ref 5–15)
BUN: 66 mg/dL — ABNORMAL HIGH (ref 8–23)
CO2: 18 mmol/L — ABNORMAL LOW (ref 22–32)
Calcium: 7.7 mg/dL — ABNORMAL LOW (ref 8.9–10.3)
Chloride: 108 mmol/L (ref 98–111)
Creatinine, Ser: 6.54 mg/dL — ABNORMAL HIGH (ref 0.61–1.24)
GFR calc Af Amer: 9 mL/min — ABNORMAL LOW (ref >60)
GFR calc non Af Amer: 8 mL/min — ABNORMAL LOW (ref >60)
Glucose, Bld: 103 mg/dL — ABNORMAL HIGH (ref 70–99)
Potassium: 4 mmol/L (ref 3.5–5.1)
Sodium: 140 mmol/L (ref 135–145)

## 2018-11-16 LAB — CBC WITH DIFFERENTIAL/PLATELET
Abs Immature Granulocytes: 0.03 10*3/uL (ref 0.00–0.07)
Basophils Absolute: 0 10*3/uL (ref 0.0–0.1)
Basophils Relative: 1 %
Eosinophils Absolute: 0.4 10*3/uL (ref 0.0–0.5)
Eosinophils Relative: 5 %
HCT: 35.1 % — ABNORMAL LOW (ref 39.0–52.0)
Hemoglobin: 10.6 g/dL — ABNORMAL LOW (ref 13.0–17.0)
Immature Granulocytes: 0 %
Lymphocytes Relative: 9 %
Lymphs Abs: 0.7 10*3/uL (ref 0.7–4.0)
MCH: 27.8 pg (ref 26.0–34.0)
MCHC: 30.2 g/dL (ref 30.0–36.0)
MCV: 92.1 fL (ref 80.0–100.0)
Monocytes Absolute: 0.5 10*3/uL (ref 0.1–1.0)
Monocytes Relative: 6 %
Neutro Abs: 6.9 10*3/uL (ref 1.7–7.7)
Neutrophils Relative %: 79 %
Platelets: 196 10*3/uL (ref 150–400)
RBC: 3.81 MIL/uL — ABNORMAL LOW (ref 4.22–5.81)
RDW: 14.4 % (ref 11.5–15.5)
WBC: 8.6 10*3/uL (ref 4.0–10.5)
nRBC: 0 % (ref 0.0–0.2)

## 2018-11-16 LAB — CULTURE, BLOOD (ROUTINE X 2)
Culture: NO GROWTH
Culture: NO GROWTH
Special Requests: ADEQUATE

## 2018-11-16 LAB — MAGNESIUM: Magnesium: 1.8 mg/dL (ref 1.7–2.4)

## 2018-11-16 MED ORDER — CALCIUM CARBONATE ANTACID 500 MG PO CHEW
1.0000 | CHEWABLE_TABLET | Freq: Three times a day (TID) | ORAL | Status: DC
  Administered 2018-11-16 – 2018-11-25 (×27): 200 mg via ORAL
  Filled 2018-11-16 (×25): qty 1

## 2018-11-16 MED ORDER — SODIUM CHLORIDE 0.9 % IV SOLN
INTRAVENOUS | Status: DC
  Administered 2018-11-16 – 2018-11-18 (×3): via INTRAVENOUS

## 2018-11-16 NOTE — Care Management Important Message (Signed)
Important Message  Patient Details IM Letter given to Nancy Marus RN to present to the Patient Name: Luke Kaufman MRN: [id number] Date of Birth: November 08, 1950   Medicare Important Message Given:  Yes     Kerin Salen 11/16/2018, 11:08 AM

## 2018-11-16 NOTE — Progress Notes (Signed)
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 fib, chronic left arm flaccid paralysis and long-term nursing home resident presenting to the emergency room with abnormal lab test, feeling weak for about a week. Patient is poor historian. Reviewed his medical records from previous hospitalization. Also reviewed records from nursing home. Patient himself tells that he stays out a lot so he might have gotten dehydrated. He denies any nausea or vomiting. He says his appetite is good. No loose stool noted in colostomy. Patient states that he is urinating but cannot tell me when was the last time he had any urine. Denies any retention of urine or abdominal pain. He does have chronic wound from abdominal wall, currently under wound care. Patient says he does not smoke, however nursing home reported that he goes out in the sun to smoke and he stays outside long time and it was very hot outside. Today morning, he was noted to be more and more weaker. Usually he would transfer from bed to chair and walk with support and now he can barely walk or barely stand. He also seem to be confused than normal. So they did a lab test today where his BMP showed creatinine of 9.47, last creatinine in March 2020 was 1.78. No reports of fever. No other resident would COVID-19 at the nursing home.   Assessment & Plan:   Principal Problem:   Acute renal failure (HCC) Active Problems:   HTN (hypertension)   CKD (chronic kidney disease) stage 3, GFR  30-59 ml/min (HCC)   HLD (hyperlipidemia)   Elevated troponin   Left hemiparesis (HCC)   Acute encephalopathy   Acute kidney injury (AKI) with acute tubular necrosis (ATN) (HCC)   Acute renal failure with probable underlying chronic kidney disease stage III: - etiology unknown - s/p fluid bolus and now on bicarb gtt; HCO3- up to 10 this morning - Neprhology consulted, will see today - foley in place - SCr 9.4 at admission; down to 8.4 - continue bicarb for now; follow up UOP - Bicarb improved, SCrimproved to 6.5; appreciate nephro help     - ostomy output is picking up; on cholestyramine   Elevated troponins - With significant acute kidney injury.  - Denies any chest pain/EKG is nonischemic. 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  - CT head is normal; continue to monitor. - PT/OT prior to discharge - PT recs noted  Hypertension     - metoprolol held; BPs ok  Acute metabolic encephalopathy - Suspect due to #1; Will need continuous monitoring and treatment for the cause. - he's A&O x 3, appropriate interaction  SIRS due to noninfectious cause:  - Blood pressures are stable.  - Lactic acid was normal.  - No localizing infection on clinical examination.  - Patient is a stabilizing.  - He was given loading dose of broad-spectrum antibiotics in the emergency room. Given severe renal failure and alternative explanation of the disease, will hold further antibiotics. - procal is negative; lactic acid remains normal -  question is he really this dehydrated; looks good clinically, continuing fluids; no abx at this time.     - improved hydration, no white count/fevers, BP good  History of PE:  - On Eliquis.   Hx of midline abdominal  wound - WOCN; continue current care  Hypokalemia - replete K+, monitor, resolved  Mild hypocalcemia     - TUMS w/ meals     - corrected Ca2+: 8.9  Continue fluids for now. Add IS.   DVT prophylaxis:eliquis Code Status:FULL Disposition Plan:TBD   Consultants:  Nephrology  Subjective: "There he is. I'm feeling pretty good. My hand is a little stiff."  Objective: Vitals:   11/15/18 0542 11/15/18 1311 11/15/18 2245 11/16/18 0333  BP: (!) 144/74 129/61 (!) 120/51 138/62  Pulse: (!) 54 69 68 (!) 58  Resp: 19 16 18 18   Temp: 97.7 F (36.5 C) 98.4 F (36.9 C) 98.2 F (36.8 C) 98 F (36.7 C)  TempSrc: Oral Oral Oral Oral  SpO2: 100% 99% 93% 98%  Weight: 86.9 kg     Height:        Intake/Output Summary (Last 24 hours) at 11/16/2018 1001 Last data filed at 11/16/2018 5400 Gross per 24 hour  Intake 2395.99 ml  Output 2850 ml  Net -454.01 ml   Filed Weights   11/12/18 0302 11/14/18 0500 11/15/18 0542  Weight: 86 kg 88.4 kg 86.9 kg    Examination:  General:68 y.o.maleresting in bed in NAD Cardiovascular: RRR, +S1, S2, no m/g/r, equal pulses throughout Respiratory: CTABL, no w/r/r, normal WOB GI: BS+, NDNT, no masses noted, no organomegaly noted, ostomy noted, midline wound bandaging CDI MSK: No c/c; left hand edema Skin: No rashes, bruises, ulcerations noted Neuro: A&O x 3,follows commands Psyc: Appropriate interaction and affect, calm/cooperative    Data Reviewed: I have personally reviewed following labs and imaging studies.  CBC: Recent Labs  Lab 11/11/18 1304 11/12/18 0510 11/13/18 0430 11/14/18 0519 11/16/18 0417  WBC 9.9 8.3 7.7 8.0 8.6  NEUTROABS 8.5*  --   --  6.4 6.9  HGB 10.5* 9.5* 8.4* 8.6* 10.6*  HCT 33.7* 30.1* 25.6* 26.9* 35.1*  MCV 90.1 87.2 87.7 88.5 92.1  PLT 190 169 165 152 867   Basic Metabolic Panel: Recent Labs  Lab 11/12/18 0510 11/13/18 0430 11/14/18 0519 11/15/18 0849 11/16/18 0417  NA 141 141 140  140 140  K 4.0 2.7* 3.2* 3.8 4.0  CL 116* 107 109 106 108  CO2 10* 16* 19* 20* 18*  GLUCOSE 91 109* 106* 125* 103*  BUN 84* 79* 80* 73* 66*  CREATININE 8.47* 8.40* 7.71* 7.26* 6.54*  CALCIUM 7.4* 6.8* 6.8* 7.5* 7.7*  MG  --   --  1.7  --  1.8  PHOS  --   --  7.2*  --   --    GFR: Estimated Creatinine Clearance: 11.4 mL/min (A) (by C-G formula based on SCr of 6.54 mg/dL (H)). Liver Function Tests: Recent Labs  Lab 11/11/18 1304 11/12/18 0510 11/13/18 0430 11/14/18 0519  AST 18 22 25   --   ALT 15 13 11   --   ALKPHOS 99 83 63  --   BILITOT 0.5 0.5 0.5  --   PROT 6.5 5.7* 5.0*  --   ALBUMIN 3.1* 2.7* 2.3* 2.5*   No results for input(s): LIPASE, AMYLASE in the last 168 hours. No results for input(s): AMMONIA in the last 168 hours. Coagulation Profile: No results for input(s): INR, PROTIME in the last 168  hours. Cardiac Enzymes: Recent Labs  Lab 11/11/18 1301  CKTOTAL 782*   BNP (last 3 results) No results for input(s): PROBNP in the last 8760 hours. HbA1C: No results for input(s): HGBA1C in the last 72 hours. CBG: No results for input(s): GLUCAP in the last 168 hours. Lipid Profile: No results for input(s): CHOL, HDL, LDLCALC, TRIG, CHOLHDL, LDLDIRECT in the last 72 hours. Thyroid Function Tests: No results for input(s): TSH, T4TOTAL, FREET4, T3FREE, THYROIDAB in the last 72 hours. Anemia Panel: No results for input(s): VITAMINB12, FOLATE, FERRITIN, TIBC, IRON, RETICCTPCT in the last 72 hours. Sepsis Labs: Recent Labs  Lab 11/11/18 1304 11/11/18 1450 11/11/18 1556 11/12/18 0851 11/13/18 0430  PROCALCITON  --   --  <0.10 <0.10 <0.10  LATICACIDVEN 0.6 0.5  --   --   --     Recent Results (from the past 240 hour(s))  Culture, blood (routine x 2)     Status: None   Collection Time: 11/11/18  1:04 PM   Specimen: BLOOD RIGHT FOREARM  Result Value Ref Range Status   Specimen Description   Final    BLOOD RIGHT FOREARM Performed at [facility], [place] [address], [place], [place] [address]    Special Requests   Final    BOTTLES DRAWN AEROBIC ONLY Blood Culture results may not be optimal due to an inadequate volume of blood received in culture bottles Performed at [facility] [address] [place], [place] [address]    Culture   Final    NO GROWTH 5 DAYS Performed at [facility], [place] [address], [place], [place] [address]    Report Status 11/16/2018 FINAL  Final  SARS Coronavirus 2 (CEPHEID - Performed in [facility] hospital lab), Hosp Order     Status: None   Collection Time: 11/11/18  1:04 PM   Specimen: Nasopharyngeal Swab  Result Value Ref Range Status   SARS Coronavirus 2 NEGATIVE NEGATIVE Final    Comment: (NOTE) If result is NEGATIVE SARS-CoV-2 target nucleic acids are NOT DETECTED. The SARS-CoV-2 RNA is generally detectable in upper and lower  respiratory specimens during the acute phase of infection. The lowest  concentration of SARS-CoV-2 viral copies this assay can detect is 250  copies / mL. A negative result does not preclude SARS-CoV-2 infection  and should not be used as the sole basis for treatment or other  patient management decisions.  A negative result may occur with  improper specimen collection / handling, submission of specimen other  than nasopharyngeal swab, presence of viral mutation(s) within the  areas targeted by this assay, and inadequate number of viral copies  (<250 copies / mL). A negative result must be combined with clinical  observations, patient history, and epidemiological information. If result is POSITIVE SARS-CoV-2 target nucleic acids are DETECTED. The SARS-CoV-2 RNA is generally detectable in upper and lower  respiratory specimens dur ing the acute phase of infection.  Positive  results are indicative of active infection with SARS-CoV-2.  Clinical  correlation with patient history and other diagnostic information is  necessary to determine patient infection  status.  Positive results do  not rule out bacterial infection or co-infection with other viruses. If result is PRESUMPTIVE POSTIVE SARS-CoV-2 nucleic acids MAY BE PRESENT.   A presumptive positive result was obtained on the submitted specimen  and confirmed on repeat testing.  While 2019 novel coronavirus  (SARS-CoV-2) nucleic acids may be present in the submitted sample  additional  confirmatory testing may be necessary for epidemiological  and / or clinical management purposes  to differentiate between  SARS-CoV-2 and other Sarbecovirus currently known to infect humans.  If clinically indicated additional testing with an alternate test  methodology [phone]) is advised. The SARS-CoV-2 RNA is generally  detectable in upper and lower respiratory sp ecimens during the acute  phase of infection. The expected result is Negative. Fact Sheet for Patients:  StrictlyIdeas.no Fact Sheet for Healthcare Providers: BankingDealers.co.za This test is not yet approved or cleared by the [place] FDA and has been authorized for detection and/or diagnosis of SARS-CoV-2 by FDA under an Emergency Use Authorization (EUA).  This EUA will remain in effect (meaning this test can be used) for the duration of the COVID-19 declaration under Section 564(b)(1) of the Act, 21 U.S.C. section 360bbb-3(b)(1), unless the authorization is terminated or revoked sooner. Performed at [facility], [place] [address], [place], [place] [address]   Culture, blood (routine x 2)     Status: None   Collection Time: 11/11/18  4:51 PM   Specimen: BLOOD  Result Value Ref Range Status   Specimen Description   Final    BLOOD RIGHT ANTECUBITAL Performed at [facility] [address], [place], [place] [address]    Special Requests   Final    BOTTLES DRAWN AEROBIC AND ANAEROBIC Blood Culture adequate volume Performed at [facility] [address] [place], [place] [address]    Culture   Final    NO GROWTH 5 DAYS Performed at [facility], [place] [address], [place], [place] [address]    Report Status 11/16/2018 FINAL  Final  Urine culture     Status: None   Collection Time: 11/11/18  4:51 PM   Specimen: In/Out Cath Urine  Result Value Ref Range Status   Specimen Description   Final    IN/OUT CATH URINE Performed at [facility] [address] [place], [place] [address]    Special Requests   Final    NONE Performed at [facility], [place] [address], [place], [place] [address]    Culture   Final    NO GROWTH Performed at [facility], [place] [address] [place], [place] [address]    Report Status 11/12/2018 FINAL  Final  MRSA PCR Screening     Status: None   Collection Time: 11/12/18  5:10 AM   Specimen: Nasal Mucosa; Nasopharyngeal  Result Value Ref Range Status   MRSA by PCR NEGATIVE NEGATIVE Final    Comment:        The GeneXpert MRSA Assay (FDA approved for NASAL specimens only), is one component of a comprehensive MRSA colonization surveillance program. It is not intended to diagnose MRSA infection nor to guide or monitor treatment for MRSA infections. Performed at [facility], [place] [address] [place], [place] [address]          Radiology Studies: No results found.      Scheduled Meds: . apixaban  2.5 mg Oral BID  . aspirin  81 mg Oral Daily  . atorvastatin  80 mg Oral q1800  . cholestyramine  4 g Oral Daily  . ferrous sulfate  325 mg Oral Q breakfast  . folic acid  0.5 mg Oral Daily  . gabapentin  100 mg Oral Daily  . mouth rinse  15 mL Mouth Rinse BID  . Melatonin  6 mg Oral QHS  .  mirtazapine  7.5 mg Oral QHS  . pantoprazole  40 mg Oral Daily  . sertraline  100 mg Oral Daily  . tamsulosin  0.4 mg Oral Daily   Continuous Infusions: .  sodium bicarbonate  infusion 1000 mL 50 mL/hr at 11/16/18 0324      LOS: 5 days    Time spent: 25 minutes spent in the coordination of care today.    Jonnie Finner, DO [facility] Pager [phone]  If 7PM-7AM, please contact night-coverage [email or web address] Password [email or web address] 11/16/2018, 10:01 AM

## 2018-11-16 NOTE — Progress Notes (Signed)
[facility] Progress Note  Subjective: He feels ok.  Patient had 2.8 liters UOP over 7/26 charted.  He states he is on oxygen at his SNF - he's not sure how much and is on 2 liters here.  Baseline left-sided neuro deficits with stroke.   Review of systems:  Denies shortness of breath or chest pain Denies n/v   Vitals:   11/15/18 1311 11/15/18 2245 11/16/18 0333 11/16/18 1445  BP: 129/61 (!) 120/51 138/62 136/63  Pulse: 69 68 (!) 58 67  Resp: 16 18 18 18   Temp: 98.4 F (36.9 C) 98.2 F (36.8 C) 98 F (36.7 C) 97.7 F (36.5 C)  TempSrc: Oral Oral Oral Oral  SpO2: 99% 93% 98% 100%  Weight:      Height:        Inpatient medications: . apixaban  2.5 mg Oral BID  . aspirin  81 mg Oral Daily  . atorvastatin  80 mg Oral q1800  . calcium carbonate  1 tablet Oral TID WC  . cholestyramine  4 g Oral Daily  . ferrous sulfate  325 mg Oral Q breakfast  . folic acid  0.5 mg Oral Daily  . gabapentin  100 mg Oral Daily  . mouth rinse  15 mL Mouth Rinse BID  . Melatonin  6 mg Oral QHS  . mirtazapine  7.5 mg Oral QHS  . pantoprazole  40 mg Oral Daily  . sertraline  100 mg Oral Daily  . tamsulosin  0.4 mg Oral Daily   .  sodium bicarbonate  infusion 1000 mL 50 mL/hr at 11/16/18 5397   acetaminophen **OR** acetaminophen, albuterol, diphenoxylate-atropine, ondansetron **OR** ondansetron (ZOFRAN) IV    Exam: Gen chron ill appearing, alert and pleasant Sclera anicteric, throat is quite dry  No jvd or bruits Chest clar bilat to basese RRR no MRG Abd soft nt/ND; RLQ ostomy intact GU normal male w/ foley cath, hypospadias Ext no edema appreciated lower extremities; LUE edema  Neuro is awake, Ox 3 , L arm paresis    Home meds:  - apixaban 2.5 bid  - atorvastatin 80 qd/ aspirin 81/ cholestyramine 4g qd  - tamsulosin 0.4/ omeprazole 40 qd  - sertraline 100 qd/ mirtazapine 7.5 hs/ gabapentin 100 qd  - O2 2L [place]  - metoprolol 12.5 bid  - prn's/ vitamins/  supplements   CXR > 7/22 IMPRESSION: Left base atelectasis or early infiltrate.  Renal US > 13cm R kidney, no hydro, +^echo.  6.8cm L kidney, cortical thinning and ^^echo, no hydro.   ECHO: LVEF 60%, MV regurg appears to have worsened but images are poor  UA 7.22 > few bact, 21-50 rbc and 6- 10 wbc, prot neg  UNa 14,  UCr 116    Assessment/ Plan: 1. AKI on CKD3 - one kidney is atrophic and prob not functional at all.    Hypotension/ vol depletion most likely cause of AKI, sepsis could be another, although cx's are negative. Baseline creat is 1.4. Peak creat here 9.4 on admission.  Resolving with supportive care and nonoliguric.  Marginal long-term HD candidate; hopeful for recovery without RRT.  Transition to normal saline.   2. Hypokalemia - repleted; resolved  3. HTN - acceptable, off metoprolol  4. Chron abd wound 5. Pyuria - cx's negative  6. H/o CVA w/ left sided weakness 7. Chronic O2 nasal cannula 8. Chronic debility / SNF dependence - hx of CVA , not sure baseline mobility 9. Anemia - improving  Iron/TIBC/Ferritin/ %Sat    Component Value Date/Time   IRON 11 (L) 10/09/2017 0500   TIBC 151 (L) 10/09/2017 0500   FERRITIN 519 (H) 10/09/2017 0500   IRONPCTSAT 7 (L) 10/09/2017 0500   Recent Labs  Lab 11/14/18 0519  11/16/18 0417  NA 140   < > 140  K 3.2*   < > 4.0  CL 109   < > 108  CO2 19*   < > 18*  GLUCOSE 106*   < > 103*  BUN 80*   < > 66*  CREATININE 7.71*   < > 6.54*  CALCIUM 6.8*   < > 7.7*  PHOS 7.2*  --   --   ALBUMIN 2.5*  --   --    < > = values in this interval not displayed.   Recent Labs  Lab 11/13/18 0430  AST 25  ALT 11  ALKPHOS 63  BILITOT 0.5  PROT 5.0*   Recent Labs  Lab 11/16/18 0417  WBC 8.6  HGB 10.6*  HCT 35.1*  PLT 196    Claudia Desanctis 11/16/2018 3:31 PM

## 2018-11-16 NOTE — TOC Initial Note (Signed)
Transition of Care [facility]) - Initial/Assessment Note    Patient Details  Name: Luke Kaufman MRN: [id number] Date of Birth: Jun 04, 1950  Transition of Care (TOC) CM/SW Contact:    Joaquin Courts, RN Phone Number: 11/16/2018, 12:25 PM  Clinical Narrative:          CM spoke with rep for [facility] who states patient can return to facility where he is a long term resident. Patient will need a negative covid test within 3 days of discharge. FL2 will need to be submitted only if any changes from previous.           Expected Discharge Plan: Skilled Nursing Facility Barriers to Discharge: Continued Medical Work up   Patient Goals and CMS Choice        Expected Discharge Plan and Services Expected Discharge Plan: [place]   Discharge Planning Services: CM Consult   Living arrangements for the past 2 months: [place] Expected Discharge Date: (unknown)               DME Arranged: N/A DME Agency: NA       HH Arranged: NA [place] Agency: NA        Prior Living Arrangements/Services Living arrangements for the past 2 months: [place] Lives with:: Facility Resident Patient language and need for interpreter reviewed:: Yes Do you feel safe going back to the place where you live?: Yes      Need for Family Participation in Patient Care: Yes (Comment) Care giver support system in place?: Yes (comment)   Criminal Activity/Legal Involvement Pertinent to Current Situation/Hospitalization: No - Comment as needed  Activities of Daily Living Home Assistive Devices/Equipment: Hand-held shower hose, Grab bars in shower, Grab bars around toilet, Hospital bed, Ostomy supplies, Oxygen, CPAP, Blood pressure cuff, Scales, Nebulizer, Wheelchair, Environmental consultant (specify type)(front wheeled walker-[facility] has necessary equipment for their residents) ADL Screening (condition at time of admission) Patient's cognitive ability adequate to  safely complete daily activities?: Yes Is the patient deaf or have difficulty hearing?: No Does the patient have difficulty seeing, even when wearing glasses/contacts?: No Does the patient have difficulty concentrating, remembering, or making decisions?: Yes Patient able to express need for assistance with ADLs?: Yes Does the patient have difficulty dressing or bathing?: Yes Independently performs ADLs?: No Communication: Independent Dressing (OT): Needs assistance Is this a change from baseline?: Pre-admission baseline Grooming: Needs assistance Is this a change from baseline?: Pre-admission baseline Feeding: Needs assistance Is this a change from baseline?: Pre-admission baseline Bathing: Needs assistance Is this a change from baseline?: Pre-admission baseline Toileting: Needs assistance Is this a change from baseline?: Pre-admission baseline In/Out Bed: Needs assistance Is this a change from baseline?: Pre-admission baseline Walks in Home: Needs assistance Is this a change from baseline?: Pre-admission baseline Does the patient have difficulty walking or climbing stairs?: Yes(secondary to weakness) Weakness of Legs: Left(secondary to stroke) Weakness of Arms/Hands: Left(secondary to stroke)  Permission Sought/Granted                  Emotional Assessment Appearance:: Appears stated age     Orientation: : Oriented to Self, Oriented to Place, Oriented to  Time, Oriented to Situation   Psych Involvement: No (comment)  Admission diagnosis:  Acute renal failure ([place]) [N17.9] Elevated troponin [R79.89] Acute renal failure, unspecified acute renal failure type ([place]) [N17.9] Community acquired pneumonia of left lower lobe of lung ([place]) [J18.1] Patient Active Problem List   Diagnosis Date Noted  . Acute  kidney injury (AKI) with acute tubular necrosis (ATN) ([place]) 11/11/2018  . Upper airway cough syndrome 03/31/2018  . DOE (dyspnea on exertion) 03/30/2018  . 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 Acute renal failure ([place]) 10/14/2017  . Pulmonary embolism and infarction ([place]) 10/14/2017  . Acute metabolic encephalopathy 88/28/0034  . Cardiac arrest ([place]) 10/14/2017  . Non-STEMI (non-ST elevated myocardial infarction) ([place])   . Pulmonary embolus, left ([place])   . Acute deep vein thrombosis (DVT) of distal end of left lower extremity ([place])   . Acute deep vein thrombosis (DVT) of radial vein of right upper extremity ([place])   . Left hemiparesis ([place])   . Severe protein-calorie malnutrition ([place])   . Acute encephalopathy   . Elevated troponin   . Acute on chronic respiratory failure with hypoxia ([place])   . Cardiac arrest ([place])   . AKI (acute kidney injury) ([place])   . Acute respiratory failure with hypoxemia ([place])   . Respiratory failure ([place])   . Abdominal pain 09/04/2017  . PUD (peptic ulcer disease) 09/04/2017  . Mesenteric ischemia ([place]) 09/04/2017  . 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 Left arm numbness 12/14/2015  . Numbness 12/14/2015  . Bilateral carotid artery stenosis 11/22/2015  . Recurrent major depressive disorder in partial remission ([place]) 10/19/2015  . Mitral valve prolapse 09/07/2015  . Vitamin D deficiency 08/31/2015  . Arterial atherosclerosis 04/10/2015  . Cataract 04/10/2015  . Cerebrovascular accident, old 04/10/2015  . Gonalgia 04/10/2015  . HLD (hyperlipidemia) 04/10/2015  . 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 Renal artery stenosis ([place]) 04/10/2015  . History of non-ST elevation myocardial infarction (NSTEMI) 04/10/2015  . Anemia associated with acute blood loss 08/24/2014  . Thrombocytopenia ([place])   . Hypokalemia   . Essential hypertension 08/09/2014  . Chest pain 08/09/2014  . Near syncope 08/09/2014  . CKD (chronic kidney disease) stage 3, GFR 30-59 ml/min (HCC) 08/09/2014  . Tobacco abuse 08/09/2014  . Upper GI bleeding   . Gastric ulcer with hemorrhage   . Acute blood loss  anemia   . Cerebral infarction ([place]) 05/16/2013  . HTN (hypertension) 05/16/2013  . 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   PCP:  Rosaria Ferries, MD Pharmacy:   [facility], [place] [address] Phone: [phone] Fax: [phone]     Social Determinants of Health (SDOH) Interventions    Readmission Risk Interventions Readmission Risk Prevention Plan 11/16/2018  Transportation Screening Complete  PCP or Specialist Appt within 3-5 Days Not Complete  Not Complete comments not ready for d/c, from [place] or [place] Complete  Social Work Consult for [place] Planning/Counseling Complete  Palliative Care Screening Not Applicable  Medication Review Press photographer) Complete  Some recent data might be hidden

## 2018-11-16 NOTE — Progress Notes (Signed)
Physical Therapy Treatment Patient Details Name: Luke Kaufman MRN: [id number] DOB: [birth year] Today's Date: 11/16/2018    History of Present Illness 68 y.o. 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 fib, chronic left arm flaccid paralysis, stroke, and long-term nursing home resident and admitted for acute renal failure.    PT Comments    Patient is progressing with gradually with therapy. He was able to perform bed mobility with supervision to min assist with [facility] elevated this session. Sit to stand transfers performed 4x with RW and patient required repeated cues for safe hand placement. He remains unable to grip walker with Lt UE. Gait training initiated today however limited by patient reports of dizziness after standing for ~ 10 minutes with intermittent seated rest breaks. He ambulated ~ 3 feet and required mod assist to maintain balance due to impaired Lt LE coordination and reduced weigth shift to Lt. He was returned to supine in bed after reports of dizziness and after ~ 1 minutes supine symptoms had resolved. Acute PT will continue to progress mobility as able to safely discharge to below venue for additional skilled PT services.   Follow Up Recommendations  SNF     Equipment Recommendations  None recommended by PT    Recommendations for Other Services       Precautions / Restrictions Precautions Precautions: Fall Precaution Comments: L sided residual deficits Restrictions Weight Bearing Restrictions: No    Mobility  Bed Mobility Overal bed mobility: Needs Assistance Bed Mobility: Supine to Sit;Sit to Supine     Supine to sit: Supervision;HOB elevated Sit to supine: Min  guard;Min assist   General bed mobility comments: pt able to sit up from bed HOB elevated with no physical assistance, cues required to completely turn and scoot to place feet on floor, pt returned to supine wint min assist for LE mobiltiy and ppositioning due to weakness and dizziness.  Transfers Overall transfer level: Needs assistance Equipment used: Rolling walker (2 wheeled) Transfers: Sit to/from Stand Sit to Stand: Min assist         General transfer comment: 4x sit to stand performed from EOB with RW, cues for hand placement and pt remains unable to grip walker with Lt hand, pt required min assist physically to initiate stand each time  Ambulation/Gait Ambulation/Gait assistance: Min assist;Mod assist Gait Distance (Feet): 3 Feet(attempted 3-4 steps forward from bed) Assistive device: Rolling walker (2 wheeled) Gait Pattern/deviations: Step-through pattern;Decreased step length - right;Decreased stride length;Decreased weight shift to left;Decreased stance time - left Gait velocity: slow and labored, SpO2 remained 94% or higher and reach 98% with mobiltiy   General Gait Details: pt with decreased stance time on Lt LE and impaired coordination of Lt LE with step pattern and step length. Patient required min to mod assist for gait with RW for 3 feet forward, he reported dizziness and backwards steps taken to return to sit on bed.       Balance Overall balance assessment: Needs assistance Sitting-balance support: No upper extremity supported;Feet supported Sitting balance-Leahy Scale: Fair     Standing balance support: Bilateral upper extremity supported Standing balance-Leahy Scale: Poor Standing balance comment: pt is reliant on UE support for standign balance and mobility, Lt UE placed on RW but unable to grip  Cognition Arousal/Alertness: Awake/alert Behavior During Therapy: WFL for tasks assessed/performed Overall Cognitive Status: Within Functional  Limits for tasks assessed                          Pertinent Vitals/Pain Pain Assessment: No/denies pain           PT Goals (current goals can now be found in the care plan section) Acute Rehab PT Goals PT Goal Formulation: With patient Time For Goal Achievement: 11/20/18 Potential to Achieve Goals: Good Progress towards PT goals: Progressing toward goals    Frequency    Min 2X/week      PT Plan Current plan remains appropriate       AM-PAC PT "6 Clicks" Mobility   Outcome Measure  Help needed turning from your back to your side while in a flat bed without using bedrails?: A Little Help needed moving from lying on your back to sitting on the side of a flat bed without using bedrails?: A Little Help needed moving to and from a bed to a chair (including a wheelchair)?: A Lot Help needed standing up from a chair using your arms (e.g., wheelchair or bedside chair)?: A Little Help needed to walk in hospital room?: A Lot Help needed climbing 3-5 steps with a railing? : A Lot 6 Click Score: 15    End of Session Equipment Utilized During Treatment: Gait belt Activity Tolerance: Patient tolerated treatment well Patient left: in bed;with call bell/phone within reach;with nursing/sitter in room;with SCD's reapplied Nurse Communication: Mobility status PT Visit Diagnosis: Other abnormalities of gait and mobility (R26.89)     Time: 0938-1829 PT Time Calculation (min) (ACUTE ONLY): 31 min  Charges:  $Gait Training: 8-22 mins $Therapeutic Activity: 8-22 mins                     Kipp Brood, PT, DPT, [facility] Physical Therapist with [facility]  11/16/2018 4:32 PM

## 2018-11-17 LAB — BASIC METABOLIC PANEL
Anion gap: 8 (ref 5–15)
BUN: 57 mg/dL — ABNORMAL HIGH (ref 8–23)
CO2: 16 mmol/L — ABNORMAL LOW (ref 22–32)
Calcium: 7.8 mg/dL — ABNORMAL LOW (ref 8.9–10.3)
Chloride: 112 mmol/L — ABNORMAL HIGH (ref 98–111)
Creatinine, Ser: 5.78 mg/dL — ABNORMAL HIGH (ref 0.61–1.24)
GFR calc Af Amer: 11 mL/min — ABNORMAL LOW (ref >60)
GFR calc non Af Amer: 9 mL/min — ABNORMAL LOW (ref >60)
Glucose, Bld: 84 mg/dL (ref 70–99)
Potassium: 4.1 mmol/L (ref 3.5–5.1)
Sodium: 136 mmol/L (ref 135–145)

## 2018-11-17 MED ORDER — SODIUM BICARBONATE 650 MG PO TABS
650.0000 mg | ORAL_TABLET | Freq: Three times a day (TID) | ORAL | Status: DC
  Administered 2018-11-17 – 2018-11-18 (×3): 650 mg via ORAL
  Filled 2018-11-17 (×4): qty 1

## 2018-11-17 NOTE — Progress Notes (Signed)
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 fib, chronic left arm flaccid paralysis and long-term nursing home resident presenting to the emergency room with abnormal lab test, feeling weak for about a week. Patient is poor historian. Reviewed his medical records from previous hospitalization. Also reviewed records from nursing home. Patient himself tells that he stays out a lot so he might have gotten dehydrated. He denies any nausea or vomiting. He says his appetite is good. No loose stool noted in colostomy. Patient states that he is urinating but cannot tell me when was the last time he had any urine. Denies any retention of urine or abdominal pain. He does have chronic wound from abdominal wall, currently under wound care. Patient says he does not smoke, however nursing home reported that he goes out in the sun to smoke and he stays outside long time and it was very hot outside. Today morning, he was noted to be more and more weaker. Usually he would transfer from bed to chair and walk with support and now he can barely walk or barely stand. He also seem to be confused than normal. So they did a lab test today where his BMP showed creatinine of 9.47, last creatinine in March 2020 was 1.78. No reports of fever. No other resident would COVID-19 at the nursing home.   Assessment & Plan:   Principal Problem:   Acute renal failure (HCC) Active Problems:   HTN (hypertension)   CKD (chronic kidney disease) stage 3, GFR  30-59 ml/min (HCC)   HLD (hyperlipidemia)   Elevated troponin   Left hemiparesis (HCC)   Acute encephalopathy   Acute kidney injury (AKI) with acute tubular necrosis (ATN) (HCC)   Acute renal failure with probable underlying chronic kidney disease stage III: - etiology unknown - s/p fluid bolus and now on bicarb gtt; HCO3- up to 10 this morning - Neprhology consulted, will see today - foley in place - SCr 9.4 at admission; down to 8.4 - continue bicarb for now; follow up UOP - Bicarb improved, SCrimproved to 6.5; appreciate nephro help - ostomy output is picking up; on cholestyramine     - Scr down to 5.78 today; slow improvement; bicarb still dropping; adding bicarb tablets  Elevated troponins - With significant acute kidney injury.  - Denies any chest pain/EKG is nonischemic. 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  - CT head is normal; continue to monitor. - PT/OT prior to discharge - PT recs noted  Hypertension - metoprolol held; BPs ok  Acute metabolic encephalopathy - Suspect due to #1; Will need continuous monitoring and treatment for the cause. - he's A&O x 3, appropriate interaction  SIRS due to noninfectious cause:  - Blood pressures are stable.  - Lactic acid was normal.  - No localizing infection on clinical examination.  - Patient is a stabilizing.  - He was given loading dose of broad-spectrum antibiotics in the emergency room. Given severe renal failure and alternative explanation of the disease, will hold further  antibiotics. - procal is negative; lactic acid remains normal - question is he really this dehydrated; looks good clinically, continuing fluids; no abx at this time.     - improved hydration, no white  count/fevers, BP good  History of PE:  - On Eliquis.   Hx of midline abdominal wound - WOCN; continue current care  Hypokalemia - replete K+, monitor, resolved  Mild hypocalcemia     - TUMS w/ meals     - corrected Ca2+: 8.9  Slow going but labs are looking better. PO bicarb added. Appreciate nephro assistance.   DVT prophylaxis:eliquis Code Status:FULL Disposition Plan:TBD   Consultants:  Nephrology  Subjective: "Well, I appreciate the conversation."  Objective: Vitals:   11/16/18 1445 11/16/18 2148 11/17/18 0632 11/17/18 1509  BP: 136/63 (!) 133/56 (!) 135/56 127/63  Pulse: 67 (!) 55 (!) 55 75  Resp: 18 17 17 20   Temp: 97.7 F (36.5 C) (!) 97.5 F (36.4 C) 98.1 F (36.7 C) (!) 97.5 F (36.4 C)  TempSrc: Oral Oral Oral Oral  SpO2: 100% 99% 100% 96%  Weight:   84.6 kg   Height:        Intake/Output Summary (Last 24 hours) at 11/17/2018 1601 Last data filed at 11/17/2018 7106 Gross per 24 hour  Intake 1067.6 ml  Output 1800 ml  Net -732.4 ml   Filed Weights   11/14/18 0500 11/15/18 0542 11/17/18 2694  Weight: 88.4 kg 86.9 kg 84.6 kg    Examination:  General:68 y.o.maleresting in bed in NAD Cardiovascular: RRR, +S1, S2, no m/g/r, equal pulses throughout Respiratory: CTABL, no w/r/r, normal WOB GI: BS+, NDNT, no masses noted, no organomegaly noted, ostomy noted, midline wound bandaging CDI MSK: No c/c; left hand edema Skin: No rashes, bruises, ulcerations noted Neuro: A&O x 3,follows commands Psyc: Appropriate interaction and affect, calm/cooperative    Data Reviewed: I have personally reviewed following labs and imaging studies.  CBC: Recent Labs  Lab 11/11/18 1304 11/12/18 0510 11/13/18 0430 11/14/18 0519 11/16/18 0417  WBC 9.9 8.3 7.7 8.0 8.6  NEUTROABS 8.5*  --   --  6.4 6.9  HGB 10.5* 9.5* 8.4* 8.6* 10.6*  HCT 33.7* 30.1* 25.6* 26.9* 35.1*  MCV 90.1 87.2 87.7 88.5 92.1  PLT 190 169 165 152 854   Basic  Metabolic Panel: Recent Labs  Lab 11/13/18 0430 11/14/18 0519 11/15/18 0849 11/16/18 0417 11/17/18 0410  NA 141 140 140 140 136  K 2.7* 3.2* 3.8 4.0 4.1  CL 107 109 106 108 112*  CO2 16* 19* 20* 18* 16*  GLUCOSE 109* 106* 125* 103* 84  BUN 79* 80* 73* 66* 57*  CREATININE 8.40* 7.71* 7.26* 6.54* 5.78*  CALCIUM 6.8* 6.8* 7.5* 7.7* 7.8*  MG  --  1.7  --  1.8  --   PHOS  --  7.2*  --   --   --    GFR: Estimated Creatinine Clearance: 12.7 mL/min (A) (by C-G formula based on SCr of 5.78 mg/dL (H)). Liver Function Tests: Recent Labs  Lab 11/11/18 1304 11/12/18 0510 11/13/18 0430 11/14/18 0519  AST 18 22 25   --   ALT 15 13 11   --   ALKPHOS 99 83 63  --   BILITOT 0.5 0.5 0.5  --   PROT 6.5 5.7* 5.0*  --   ALBUMIN 3.1* 2.7* 2.3* 2.5*   No results for input(s): LIPASE, AMYLASE in the last 168 hours. No results for input(s): AMMONIA in the last 168 hours. Coagulation  Profile: No results for input(s): INR, PROTIME in the last 168 hours. Cardiac Enzymes: Recent Labs  Lab 11/11/18 1301  CKTOTAL 782*   BNP (last 3 results) No results for input(s): PROBNP in the last 8760 hours. HbA1C: No results for input(s): HGBA1C in the last 72 hours. CBG: No results for input(s): GLUCAP in the last 168 hours. Lipid Profile: No results for input(s): CHOL, HDL, LDLCALC, TRIG, CHOLHDL, LDLDIRECT in the last 72 hours. Thyroid Function Tests: No results for input(s): TSH, T4TOTAL, FREET4, T3FREE, THYROIDAB in the last 72 hours. Anemia Panel: No results for input(s): VITAMINB12, FOLATE, FERRITIN, TIBC, IRON, RETICCTPCT in the last 72 hours. Sepsis Labs: Recent Labs  Lab 11/11/18 1304 11/11/18 1450 11/11/18 1556 11/12/18 0851 11/13/18 0430  PROCALCITON  --   --  <0.10 <0.10 <0.10  LATICACIDVEN 0.6 0.5  --   --   --     Recent Results (from the past 240 hour(s))  Culture, blood (routine x 2)     Status: None   Collection Time: 11/11/18  1:04 PM   Specimen: BLOOD RIGHT FOREARM   Result Value Ref Range Status   Specimen Description   Final    BLOOD RIGHT FOREARM Performed at [facility], [place] [address] [place], [place] [address]    Special Requests   Final    BOTTLES DRAWN AEROBIC ONLY Blood Culture results may not be optimal due to an inadequate volume of blood received in culture bottles Performed at [place] [address], [place],  [address]    Culture   Final    NO GROWTH 5 DAYS Performed at [facility], [place] [address], [place],  [address]    Report Status 11/16/2018 FINAL  Final  SARS Coronavirus 2 (CEPHEID - Performed in [facility] hospital lab), Hosp Order     Status: None   Collection Time: 11/11/18  1:04 PM   Specimen: Nasopharyngeal Swab  Result Value Ref Range Status   SARS Coronavirus 2 NEGATIVE NEGATIVE Final    Comment: (NOTE) If result is NEGATIVE SARS-CoV-2 target nucleic acids are NOT DETECTED. The SARS-CoV-2 RNA is generally detectable in upper and lower  respiratory specimens during the acute phase of infection. The lowest  concentration of SARS-CoV-2 viral copies this assay can detect is 250  copies / mL. A negative result does not preclude SARS-CoV-2 infection  and should not be used as the sole basis for treatment or other  patient management decisions.  A negative result may occur with  improper specimen collection / handling, submission of specimen other  than nasopharyngeal swab, presence of viral mutation(s) within the  areas targeted by this assay, and inadequate number of viral copies  (<250 copies / mL). A negative result must be combined with clinical  observations, patient history, and epidemiological information. If result is POSITIVE SARS-CoV-2 target nucleic acids are DETECTED. The SARS-CoV-2 RNA is generally detectable in upper and lower  respiratory specimens dur ing the acute phase of infection.  Positive  results are indicative of active infection with  SARS-CoV-2.  Clinical  correlation with patient history and other diagnostic information is  necessary to determine patient infection status.  Positive results do  not rule out bacterial infection or co-infection with other viruses. If result is PRESUMPTIVE POSTIVE SARS-CoV-2 nucleic acids MAY BE PRESENT.   A presumptive positive result was obtained on the submitted specimen  and confirmed on repeat testing.  While 2019 novel coronavirus  (SARS-CoV-2)  nucleic acids may be present in the submitted sample  additional confirmatory testing may be necessary for epidemiological  and / or clinical management purposes  to differentiate between  SARS-CoV-2 and other Sarbecovirus currently known to infect humans.  If clinically indicated additional testing with an alternate test  methodology [phone]) is advised. The SARS-CoV-2 RNA is generally  detectable in upper and lower respiratory sp ecimens during the acute  phase of infection. The expected result is Negative. Fact Sheet for Patients:  StrictlyIdeas.no Fact Sheet for Healthcare Providers: BankingDealers.co.za This test is not yet approved or cleared by the [place] FDA and has been authorized for detection and/or diagnosis of SARS-CoV-2 by FDA under an Emergency Use Authorization (EUA).  This EUA will remain in effect (meaning this test can be used) for the duration of the COVID-19 declaration under Section 564(b)(1) of the Act, 21 U.S.C. section 360bbb-3(b)(1), unless the authorization is terminated or revoked sooner. Performed at [facility], [place] [address] [place], [place] [address]   Culture, blood (routine x 2)     Status: None   Collection Time: 11/11/18  4:51 PM   Specimen: BLOOD  Result Value Ref Range Status   Specimen Description   Final    BLOOD RIGHT ANTECUBITAL Performed at [facility] [address], [place], [place]  [address]    Special Requests   Final    BOTTLES DRAWN AEROBIC AND ANAEROBIC Blood Culture adequate volume Performed at [facility] [address], [place], [place] [address]    Culture   Final    NO GROWTH 5 DAYS Performed at [facility], [place] [address], [place], [place] [address]    Report Status 11/16/2018 FINAL  Final  Urine culture     Status: None   Collection Time: 11/11/18  4:51 PM   Specimen: In/Out Cath Urine  Result Value Ref Range Status   Specimen Description   Final    IN/OUT CATH URINE Performed at [facility] [address] [place], [place] [address]    Special Requests   Final    NONE Performed at [facility], [place] [address], [place], [place] [address]    Culture   Final    NO GROWTH Performed at [facility], [place] [address] [place], [place] [address]    Report Status 11/12/2018 FINAL  Final  MRSA PCR Screening     Status: None   Collection Time: 11/12/18  5:10 AM   Specimen: Nasal Mucosa; Nasopharyngeal  Result Value Ref Range Status   MRSA by PCR NEGATIVE NEGATIVE Final    Comment:        The GeneXpert MRSA Assay (FDA approved for NASAL specimens only), is one component of a comprehensive MRSA colonization surveillance program. It is not intended to diagnose MRSA infection nor to guide or monitor treatment for MRSA infections. Performed at [facility], [place] [address], [place], [place] [address]          Radiology Studies: No results found.      Scheduled Meds: . apixaban  2.5 mg Oral BID  . aspirin  81 mg Oral Daily  . atorvastatin  80 mg Oral q1800  . calcium carbonate  1 tablet Oral TID WC  . cholestyramine  4 g Oral Daily  . ferrous sulfate  325 mg Oral Q breakfast  . folic acid  0.5 mg Oral Daily  . gabapentin  100  mg Oral Daily  . mouth rinse  15 mL Mouth Rinse BID  . Melatonin  6 mg Oral QHS  . mirtazapine  7.5 mg Oral QHS  .  pantoprazole  40 mg Oral Daily  . sertraline  100 mg Oral Daily  . sodium bicarbonate  650 mg Oral TID  . tamsulosin  0.4 mg Oral Daily   Continuous Infusions: . sodium chloride 75 mL/hr at 11/17/18 0623     LOS: 6 days    Time spent: 25 minutes spent in the coordination of care today.    Jonnie Finner, DO [facility] Pager [phone]  If 7PM-7AM, please contact night-coverage [email or web address] Password [facility] 11/17/2018, 4:01 PM

## 2018-11-17 NOTE — Progress Notes (Signed)
[facility] Progress Note  Subjective:  Feels ok today.  Happy numbers are getting a little better for his kidneys.  He has been on normal saline at 75 ml/hr - no issues with breathing.  Had 975 mL UOP over 7/27.  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   Review of systems:  Denies shortness of breath or chest pain; on oxygen at facility  Denies n/v  Baseline left-sided neuro deficits with stroke.   Vitals:   11/16/18 0333 11/16/18 1445 11/16/18 2148 11/17/18 0632  BP: 138/62 136/63 (!) 133/56 (!) 135/56  Pulse: (!) 58 67 (!) 55 (!) 55  Resp: 18 18 17 17   Temp: 98 F (36.7 C) 97.7 F (36.5 C) (!) 97.5 F (36.4 C) 98.1 F (36.7 C)  TempSrc: Oral Oral Oral Oral  SpO2: 98% 100% 99% 100%  Weight:    84.6 kg  Height:        Inpatient medications: . apixaban  2.5 mg Oral BID  . aspirin  81 mg Oral Daily  . atorvastatin  80 mg Oral q1800  . calcium carbonate  1 tablet Oral TID WC  . cholestyramine  4 g Oral Daily  . ferrous sulfate  325 mg Oral Q breakfast  . folic acid  0.5 mg Oral Daily  . gabapentin  100 mg Oral Daily  . mouth rinse  15 mL Mouth Rinse BID  . Melatonin  6 mg Oral QHS  . mirtazapine  7.5 mg Oral QHS  . pantoprazole  40 mg Oral Daily  . sertraline  100 mg Oral Daily  . tamsulosin  0.4 mg Oral Daily   . sodium chloride 75 mL/hr at 11/17/18 9528   acetaminophen **OR** acetaminophen, albuterol, diphenoxylate-atropine, ondansetron **OR** ondansetron (ZOFRAN) IV    Exam: Gen chron ill appearing, alert and pleasant Chest clear bilat and unlabored Heart S1S2 no rub  Abd abdominal wound is dressed; soft nt/ND; RLQ ostomy intact GU foley cath in place Ext no edema appreciated lower extremities; trace LUE edema  Neuro is awake, Ox 3 , L arm paresis    Home meds:  - apixaban 2.5 bid  - atorvastatin 80 qd/ aspirin 81/ cholestyramine 4g qd  - tamsulosin 0.4/ omeprazole 40 qd  - sertraline 100 qd/ mirtazapine 7.5 hs/  gabapentin 100 qd  - O2 2L [place]  - metoprolol 12.5 bid  - prn's/ vitamins/ supplements   CXR > 7/22 IMPRESSION: Left base atelectasis or early infiltrate.  Renal US > 13cm R kidney, no hydro, +^echo.  6.8cm L kidney, cortical thinning and ^^echo, no hydro.   ECHO: LVEF 60%, MV regurg appears to have worsened but images are poor  UA 7.22 > few bact, 21-50 rbc and 6- 10 wbc, prot neg  UNa 14,  UCr 116    Assessment/ Plan: 1. AKI on CKD3 - one kidney is atrophic and prob not functional at all.    Hypotension/ vol depletion most likely cause of AKI, sepsis could be another, although cx's are negative.  Cannot rule out obstruction component.  Baseline creat is 1.4. Peak creat here 9.4 on admission.  Slowly resolving with supportive care and nonoliguric.  Marginal long-term HD candidate; hopeful for recovery without RRT.  Continue normal saline.  On flomax and continue foley catheter  2. Metabolic acidosis start bicarbonate  3. Hypokalemia - resolved  4. HTN - acceptable, off metoprolol  5. Chron abd wound 6. Pyuria - cx's negative  7. H/o CVA  w/ left sided weakness 8. Chronic O2 nasal cannula 9. Chronic debility / SNF dependence - hx of CVA , not sure baseline mobility 10. Anemia - improving on last check     Iron/TIBC/Ferritin/ %Sat    Component Value Date/Time   IRON 11 (L) 10/09/2017 0500   TIBC 151 (L) 10/09/2017 0500   FERRITIN 519 (H) 10/09/2017 0500   IRONPCTSAT 7 (L) 10/09/2017 0500   Recent Labs  Lab 11/14/18 0519  11/17/18 0410  NA 140   < > 136  K 3.2*   < > 4.1  CL 109   < > 112*  CO2 19*   < > 16*  GLUCOSE 106*   < > 84  BUN 80*   < > 57*  CREATININE 7.71*   < > 5.78*  CALCIUM 6.8*   < > 7.8*  PHOS 7.2*  --   --   ALBUMIN 2.5*  --   --    < > = values in this interval not displayed.   Recent Labs  Lab 11/13/18 0430  AST 25  ALT 11  ALKPHOS 63  BILITOT 0.5  PROT 5.0*   Recent Labs  Lab 11/16/18 0417  WBC 8.6  HGB 10.6*  HCT 35.1*  PLT 196     Claudia Desanctis 11/17/2018 2:30 PM

## 2018-11-18 DIAGNOSIS — N179 Acute kidney failure, unspecified: Secondary | ICD-10-CM

## 2018-11-18 LAB — CBC WITH DIFFERENTIAL/PLATELET
Abs Immature Granulocytes: 0.03 10*3/uL (ref 0.00–0.07)
Basophils Absolute: 0.1 10*3/uL (ref 0.0–0.1)
Basophils Relative: 1 %
Eosinophils Absolute: 0.4 10*3/uL (ref 0.0–0.5)
Eosinophils Relative: 5 %
HCT: 29.9 % — ABNORMAL LOW (ref 39.0–52.0)
Hemoglobin: 9.1 g/dL — ABNORMAL LOW (ref 13.0–17.0)
Immature Granulocytes: 0 %
Lymphocytes Relative: 10 %
Lymphs Abs: 0.9 10*3/uL (ref 0.7–4.0)
MCH: 27.8 pg (ref 26.0–34.0)
MCHC: 30.4 g/dL (ref 30.0–36.0)
MCV: 91.4 fL (ref 80.0–100.0)
Monocytes Absolute: 0.5 10*3/uL (ref 0.1–1.0)
Monocytes Relative: 5 %
Neutro Abs: 6.9 10*3/uL (ref 1.7–7.7)
Neutrophils Relative %: 79 %
Platelets: 190 10*3/uL (ref 150–400)
RBC: 3.27 MIL/uL — ABNORMAL LOW (ref 4.22–5.81)
RDW: 13.7 % (ref 11.5–15.5)
WBC: 8.8 10*3/uL (ref 4.0–10.5)
nRBC: 0 % (ref 0.0–0.2)

## 2018-11-18 LAB — RENAL FUNCTION PANEL
Albumin: 2.6 g/dL — ABNORMAL LOW (ref 3.5–5.0)
Anion gap: 9 (ref 5–15)
BUN: 53 mg/dL — ABNORMAL HIGH (ref 8–23)
CO2: 15 mmol/L — ABNORMAL LOW (ref 22–32)
Calcium: 8 mg/dL — ABNORMAL LOW (ref 8.9–10.3)
Chloride: 115 mmol/L — ABNORMAL HIGH (ref 98–111)
Creatinine, Ser: 5.07 mg/dL — ABNORMAL HIGH (ref 0.61–1.24)
GFR calc Af Amer: 13 mL/min — ABNORMAL LOW (ref >60)
GFR calc non Af Amer: 11 mL/min — ABNORMAL LOW (ref >60)
Glucose, Bld: 81 mg/dL (ref 70–99)
Phosphorus: 5.3 mg/dL — ABNORMAL HIGH (ref 2.5–4.6)
Potassium: 4.7 mmol/L (ref 3.5–5.1)
Sodium: 139 mmol/L (ref 135–145)

## 2018-11-18 LAB — IRON AND TIBC
Iron: 58 ug/dL (ref 45–182)
Saturation Ratios: 21 % (ref 17.9–39.5)
TIBC: 271 ug/dL (ref 250–450)
UIBC: 213 ug/dL

## 2018-11-18 LAB — MAGNESIUM: Magnesium: 1.8 mg/dL (ref 1.7–2.4)

## 2018-11-18 LAB — FERRITIN: Ferritin: 125 ng/mL (ref 24–336)

## 2018-11-18 MED ORDER — ENSURE ENLIVE PO LIQD
237.0000 mL | Freq: Two times a day (BID) | ORAL | Status: DC
  Administered 2018-11-19: 237 mL via ORAL

## 2018-11-18 MED ORDER — SODIUM BICARBONATE 8.4 % IV SOLN
INTRAVENOUS | Status: DC
  Administered 2018-11-18 – 2018-11-20 (×3): via INTRAVENOUS
  Filled 2018-11-18 (×6): qty 150

## 2018-11-18 MED ORDER — SODIUM CHLORIDE 0.9 % IV BOLUS
500.0000 mL | Freq: Once | INTRAVENOUS | Status: AC
  Administered 2018-11-18: 500 mL via INTRAVENOUS

## 2018-11-18 NOTE — Progress Notes (Signed)
[facility] Progress Note  Subjective:  He has continued on normal saline at 75/hr.  He had 650 mL UOP over 7/28 with 1.8 liters stool/ostomy output.  He has had bleeding from his abdominal wound and his ostomy is leaking stool.  Contacted his nurse and have paged primary team.     Review of systems:    Denies shortness of breath or chest pain; on oxygen at facility  Denies n/v  Baseline left-sided neuro deficits with stroke.   Vitals:   11/17/18 1509 11/17/18 2016 11/18/18 0604 11/18/18 1409  BP: 127/63 (!) 116/56 (!) 131/57 130/68  Pulse: 75 69 (!) 58 75  Resp: 20 16 16 16   Temp: (!) 97.5 F (36.4 C) 98 F (36.7 C) 98 F (36.7 C) 98.2 F (36.8 C)  TempSrc: Oral Oral Oral Oral  SpO2: 96% 96% 98% 97%  Weight:   84.4 kg   Height:        Inpatient medications: . apixaban  2.5 mg Oral BID  . aspirin  81 mg Oral Daily  . atorvastatin  80 mg Oral q1800  . calcium carbonate  1 tablet Oral TID WC  . cholestyramine  4 g Oral Daily  . ferrous sulfate  325 mg Oral Q breakfast  . folic acid  0.5 mg Oral Daily  . gabapentin  100 mg Oral Daily  . mouth rinse  15 mL Mouth Rinse BID  . Melatonin  6 mg Oral QHS  . mirtazapine  7.5 mg Oral QHS  . pantoprazole  40 mg Oral Daily  . sertraline  100 mg Oral Daily  . sodium bicarbonate  650 mg Oral TID  . tamsulosin  0.4 mg Oral Daily   . sodium chloride 75 mL/hr at 11/18/18 2951   acetaminophen **OR** acetaminophen, albuterol, diphenoxylate-atropine, ondansetron **OR** ondansetron (ZOFRAN) IV    Exam: Gen chron ill appearing adult male  Chest clear bilat and unlabored Heart S1S2 no rub  Abd abdominal wound with serosanginous drainage onto his down; RLQ ostomy with stool spilling out of the bag GU foley cath in place Ext no edema appreciated lower extremities; trace LUE edema  Neuro is awake, Ox 3 , L arm paresis    Home meds:  - apixaban 2.5 bid  - atorvastatin 80 qd/ aspirin 81/ cholestyramine 4g qd  -  tamsulosin 0.4/ omeprazole 40 qd  - sertraline 100 qd/ mirtazapine 7.5 hs/ gabapentin 100 qd  - O2 2L [place]  - metoprolol 12.5 bid  - prn's/ vitamins/ supplements   CXR > 7/22 IMPRESSION: Left base atelectasis or early infiltrate.  Renal US > 13cm R kidney, no hydro, +^echo.  6.8cm L kidney, cortical thinning and ^^echo, no hydro.   ECHO: LVEF 60%, MV regurg appears to have worsened but images are poor  UA 7.22 > few bact, 21-50 rbc and 6- 10 wbc, prot neg  UNa 14,  UCr 116    Assessment/ Plan: 1. AKI on CKD3 - one kidney is atrophic and prob not functional at all.    Hypotension/ vol depletion in part the cause of AKI.  Pre-renal insults of prolonged heat exposure.  Cannot rule out obstruction component as improving s/p foley as well.  Reported baseline creat is 1.4. Peak creat here 9.4 on admission.  Slowly resolving with supportive care and nonoliguric.  Marginal long-term HD candidate; hopeful for recovery without RRT.  Continue fluids - transition to bicarb gtt .  On flomax and continue foley catheter  2. Metabolic  acidosis transition to bicarbonate gtt    3. Hypokalemia - resolved  4. HTN - acceptable, off metoprolol; avoid hypotension  5. Chron abd wound 6. Pyuria - cx's negative  7. H/o CVA w/ left sided weakness 8. Chronic O2 nasal cannula 9. Chronic debility / SNF dependence - hx of CVA , not sure baseline mobility 10. Anemianomocytic - no acute indication for PRBC's.  Add on iron profile.   He has had bleeding from his abdominal wound and his ostomy is leaking stool.  Contacted his nurse and they are changing the ostomy and his dressing.  She has contacted his primary team.  I have paged primary team, as well.      Iron/TIBC/Ferritin/ %Sat    Component Value Date/Time   IRON 11 (L) 10/09/2017 0500   TIBC 151 (L) 10/09/2017 0500   FERRITIN 519 (H) 10/09/2017 0500   IRONPCTSAT 7 (L) 10/09/2017 0500   Recent Labs  Lab 11/18/18 0444  NA 139  K 4.7  CL 115*  CO2 15*   GLUCOSE 81  BUN 53*  CREATININE 5.07*  CALCIUM 8.0*  PHOS 5.3*  ALBUMIN 2.6*   Recent Labs  Lab 11/13/18 0430  AST 25  ALT 11  ALKPHOS 63  BILITOT 0.5  PROT 5.0*   Recent Labs  Lab 11/18/18 0444  WBC 8.8  HGB 9.1*  HCT 29.9*  PLT 190    Claudia Desanctis 11/18/2018 4:00 PM

## 2018-11-18 NOTE — Progress Notes (Signed)
PT Cancellation Note  Patient Details Name: Jaelin Fackler MRN: [id number] DOB: 1950-06-24   Cancelled Treatment:     pt was OOB earlier today.  Now back in bed sound asleep.  Pt has been evaluated with rec to return to SNF.  Will attempt to see another day as schedule permits.    Rica Koyanagi  PTA Acute  Rehabilitation Services Pager      [phone] Office      [phone]

## 2018-11-18 NOTE — Progress Notes (Signed)
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 fib, chronic left arm flaccid paralysis and long-term nursing home resident presenting to the emergency room with abnormal lab test, feeling weak for about a week. Patient is poor historian. Reviewed his medical records from previous hospitalization. Also reviewed records from nursing home. Patient himself tells that he stays out a lot so he might have gotten dehydrated. He denies any nausea or vomiting. He says his appetite is good. No loose stool noted in colostomy. Patient states that he is urinating but cannot tell me when was the last time he had any urine. Denies any retention of urine or abdominal pain. He does have chronic wound from abdominal wall, currently under wound care. Patient says he does not smoke, however nursing home reported that he goes out in the sun to smoke and he stays outside long time and it was very hot outside. Today morning, he was noted to be more and more weaker. Usually he would transfer from bed to chair and walk with support and now he can barely walk or barely stand. He also seem to be confused than normal. So they did a lab test today where his BMP showed creatinine of 9.47, last creatinine in March 2020 was 1.78. No reports of fever. No other resident would COVID-19 at the nursing home.   Assessment & Plan:   Principal Problem:   Acute renal failure (HCC) Active Problems:   HTN (hypertension)   CKD (chronic kidney disease) stage 3, GFR 30-59 ml/min (HCC)   HLD  (hyperlipidemia)   Elevated troponin   Left hemiparesis (HCC)   Acute encephalopathy   Acute kidney injury (AKI) with acute tubular necrosis (ATN) (HCC)  Acute renal failure with probable underlying chronic kidney disease stage III: - etiology unknown - s/p fluid bolus and now on bicarb gtt; HCO3- up to 10 this morning - Neprhology consulted, will see today - foley in place - SCr 9.4 at admission; down to 8.4 - continue bicarb for now; follow up UOP - Bicarbimproved, SCrimproved to6.5; appreciate nephro help - ostomy output is picking up; on cholestyramine     - Scr down to 5.0 today; slow improvement     - Repeat bmet in AM  Elevated troponins - With significant acute kidney injury.  - Denies any chest pain/EKG is nonischemic. - echo: EF 60-65% w/ normal LVF - continue ASA, statin and metoprolol - Metoprolol had been on hold  Old CVA with left flaccid paralysis - No new neurological deficit.  - CT head is normal; continue to monitor. - PT/OTconsulted - PT recs noted  Hypertension - metoprolol held initially    - BP currently stable  Acute metabolic encephalopathy - Suspect due to #1; Will need continuous monitoring and treatment for the cause. - he's A&O x 3, appropriate interaction     - Stable at this time  SIRS due to noninfectious cause:  - Blood pressures are stable.  - Lactic acid was normal.  - No localizing infection on clinical examination.  - Patient is a stabilizing.  - He was given loading dose of broad-spectrum antibiotics in the emergency room. Given severe renal  failure and alternative explanation of the disease, will hold further antibiotics. - procal is negative; lactic acid remains normal - question is he really this dehydrated; looks good clinically, continuing fluids; no abx at this time. - Stable at this time  History  of PE:  - On Eliquis, seems stable  Hx of midline abdominal wound - WOCN; continue current care  Hypokalemia - normalized     - repeat bmet in AM  Mild hypocalcemia - TUMS w/ meals - corrected Ca2+: 8.9   DVT prophylaxis: Eliquis Code Status: Full Family Communication: Pt in room, family not at bedside Disposition Plan: Uncertain at this time  Consultants:   Nephrology  Procedures:     Antimicrobials: Anti-infectives (From admission, onward)   Start     Dose/Rate Route Frequency Ordered Stop   11/11/18 1430  ceFEPIme (MAXIPIME) 2 g in sodium chloride 0.9 % 100 mL IVPB     2 g 200 mL/hr over 30 Minutes Intravenous STAT 11/11/18 1420 11/11/18 1523   11/11/18 1430  vancomycin (VANCOCIN) 1,500 mg in sodium chloride 0.9 % 500 mL IVPB     1,500 mg 250 mL/hr over 120 Minutes Intravenous STAT 11/11/18 1420 11/11/18 2151   11/11/18 1415  azithromycin (ZITHROMAX) 500 mg in sodium chloride 0.9 % 250 mL IVPB     500 mg 250 mL/hr over 60 Minutes Intravenous  Once 11/11/18 1414 11/12/18 0036       Subjective: Without complaints this AM  Objective: Vitals:   11/17/18 1509 11/17/18 2016 11/18/18 0604 11/18/18 1409  BP: 127/63 (!) 116/56 (!) 131/57 130/68  Pulse: 75 69 (!) 58 75  Resp: 20 16 16 16   Temp: (!) 97.5 F (36.4 C) 98 F (36.7 C) 98 F (36.7 C) 98.2 F (36.8 C)  TempSrc: Oral Oral Oral Oral  SpO2: 96% 96% 98% 97%  Weight:   84.4 kg   Height:        Intake/Output Summary (Last 24 hours) at 11/18/2018 1847 Last data filed at 11/18/2018 1800 Gross per 24 hour  Intake 2935.13 ml  Output 2650 ml  Net 285.13 ml   Filed Weights   11/15/18 0542 11/17/18 0632 11/18/18 0604  Weight: 86.9 kg 84.6 kg 84.4 kg    Examination:  General exam: Appears calm and comfortable  Respiratory system: Clear to auscultation. Respiratory effort normal. Cardiovascular system: S1 & S2 heard, RRR Gastrointestinal system: Abdomen is nondistended, soft  and nontender. No organomegaly or masses felt. Normal bowel sounds heard. Central nervous system: Alert and oriented. No focal neurological deficits. Extremities: Symmetric 5 x 5 power. Skin: No rashes, lesions  Psychiatry: Judgement and insight appear normal. Mood & affect appropriate.   Data Reviewed: I have personally reviewed following labs and imaging studies  CBC: Recent Labs  Lab 11/12/18 0510 11/13/18 0430 11/14/18 0519 11/16/18 0417 11/18/18 0444  WBC 8.3 7.7 8.0 8.6 8.8  NEUTROABS  --   --  6.4 6.9 6.9  HGB 9.5* 8.4* 8.6* 10.6* 9.1*  HCT 30.1* 25.6* 26.9* 35.1* 29.9*  MCV 87.2 87.7 88.5 92.1 91.4  PLT 169 165 152 196 099   Basic Metabolic Panel: Recent Labs  Lab 11/14/18 0519 11/15/18 0849 11/16/18 0417 11/17/18 0410 11/18/18 0444  NA 140 140 140 136 139  K 3.2* 3.8 4.0 4.1 4.7  CL 109 106 108 112* 115*  CO2 19* 20* 18* 16* 15*  GLUCOSE 106* 125* 103* 84 81  BUN 80* 73* 66* 57* 53*  CREATININE 7.71* 7.26* 6.54*  5.78* 5.07*  CALCIUM 6.8* 7.5* 7.7* 7.8* 8.0*  MG 1.7  --  1.8  --  1.8  PHOS 7.2*  --   --   --  5.3*   GFR: Estimated Creatinine Clearance: 14.5 mL/min (A) (by C-G formula based on SCr of 5.07 mg/dL (H)). Liver Function Tests: Recent Labs  Lab 11/12/18 0510 11/13/18 0430 11/14/18 0519 11/18/18 0444  AST 22 25  --   --   ALT 13 11  --   --   ALKPHOS 83 63  --   --   BILITOT 0.5 0.5  --   --   PROT 5.7* 5.0*  --   --   ALBUMIN 2.7* 2.3* 2.5* 2.6*   No results for input(s): LIPASE, AMYLASE in the last 168 hours. No results for input(s): AMMONIA in the last 168 hours. Coagulation Profile: No results for input(s): INR, PROTIME in the last 168 hours. Cardiac Enzymes: No results for input(s): CKTOTAL, CKMB, CKMBINDEX, TROPONINI in the last 168 hours. BNP (last 3 results) No results for input(s): PROBNP in the last 8760 hours. HbA1C: No results for input(s): HGBA1C in the last 72 hours. CBG: No results for input(s): GLUCAP in the last  168 hours. Lipid Profile: No results for input(s): CHOL, HDL, LDLCALC, TRIG, CHOLHDL, LDLDIRECT in the last 72 hours. Thyroid Function Tests: No results for input(s): TSH, T4TOTAL, FREET4, T3FREE, THYROIDAB in the last 72 hours. Anemia Panel: No results for input(s): VITAMINB12, FOLATE, FERRITIN, TIBC, IRON, RETICCTPCT in the last 72 hours. Sepsis Labs: Recent Labs  Lab 11/12/18 0851 11/13/18 0430  PROCALCITON <0.10 <0.10    Recent Results (from the past 240 hour(s))  Culture, blood (routine x 2)     Status: None   Collection Time: 11/11/18  1:04 PM   Specimen: BLOOD RIGHT FOREARM  Result Value Ref Range Status   Specimen Description   Final    BLOOD RIGHT FOREARM Performed at [facility], [place] [address], [place], [place] [address]    Special Requests   Final    BOTTLES DRAWN AEROBIC ONLY Blood Culture results may not be optimal due to an inadequate volume of blood received in culture bottles Performed at [place] [address] [place], [place] [address]    Culture   Final    NO GROWTH 5 DAYS Performed at [facility], [place] [address], [place],  [address]    Report Status 11/16/2018 FINAL  Final  SARS Coronavirus 2 (CEPHEID - Performed in [place] hospital lab), Hosp Order     Status: None   Collection Time: 11/11/18  1:04 PM   Specimen: Nasopharyngeal Swab  Result Value Ref Range Status   SARS Coronavirus 2 NEGATIVE NEGATIVE Final    Comment: (NOTE) If result is NEGATIVE SARS-CoV-2 target nucleic acids are NOT DETECTED. The SARS-CoV-2 RNA is generally detectable in upper and lower  respiratory specimens during the acute phase of infection. The lowest  concentration of SARS-CoV-2 viral copies this assay can detect is 250  copies / mL. A negative result does not preclude SARS-CoV-2 infection  and should not be used as the sole basis for treatment or other  patient management decisions.  A negative result may occur with   improper specimen collection / handling, submission of specimen other  than nasopharyngeal swab, presence of viral mutation(s) within the  areas targeted by this assay, and inadequate number of viral copies  (<250 copies / mL). A negative result  must be combined with clinical  observations, patient history, and epidemiological information. If result is POSITIVE SARS-CoV-2 target nucleic acids are DETECTED. The SARS-CoV-2 RNA is generally detectable in upper and lower  respiratory specimens dur ing the acute phase of infection.  Positive  results are indicative of active infection with SARS-CoV-2.  Clinical  correlation with patient history and other diagnostic information is  necessary to determine patient infection status.  Positive results do  not rule out bacterial infection or co-infection with other viruses. If result is PRESUMPTIVE POSTIVE SARS-CoV-2 nucleic acids MAY BE PRESENT.   A presumptive positive result was obtained on the submitted specimen  and confirmed on repeat testing.  While 2019 novel coronavirus  (SARS-CoV-2) nucleic acids may be present in the submitted sample  additional confirmatory testing may be necessary for epidemiological  and / or clinical management purposes  to differentiate between  SARS-CoV-2 and other Sarbecovirus currently known to infect humans.  If clinically indicated additional testing with an alternate test  methodology [phone]) is advised. The SARS-CoV-2 RNA is generally  detectable in upper and lower respiratory sp ecimens during the acute  phase of infection. The expected result is Negative. Fact Sheet for Patients:  StrictlyIdeas.no Fact Sheet for Healthcare Providers: BankingDealers.co.za This test is not yet approved or cleared by the [place] FDA and has been authorized for detection and/or diagnosis of SARS-CoV-2 by FDA under an Emergency Use Authorization (EUA).  This EUA will  remain in effect (meaning this test can be used) for the duration of the COVID-19 declaration under Section 564(b)(1) of the Act, 21 U.S.C. section 360bbb-3(b)(1), unless the authorization is terminated or revoked sooner. Performed at [facility], [place] [address], [place], [place] [address]   Culture, blood (routine x 2)     Status: None   Collection Time: 11/11/18  4:51 PM   Specimen: BLOOD  Result Value Ref Range Status   Specimen Description   Final    BLOOD RIGHT ANTECUBITAL Performed at [facility] [address], [place], [place] [address]    Special Requests   Final    BOTTLES DRAWN AEROBIC AND ANAEROBIC Blood Culture adequate volume Performed at [facility] [address], [place], [place] [address]    Culture   Final    NO GROWTH 5 DAYS Performed at [facility], [place] [address] [place], [place] [address]    Report Status 11/16/2018 FINAL  Final  Urine culture     Status: None   Collection Time: 11/11/18  4:51 PM   Specimen: In/Out Cath Urine  Result Value Ref Range Status   Specimen Description   Final    IN/OUT CATH URINE Performed at [facility] [address], [place], [place] [address]    Special Requests   Final    NONE Performed at [facility], [place] [address], [place], [place] [address]    Culture   Final    NO GROWTH Performed at [facility], [place] [address] [place], [place] [address]    Report Status 11/12/2018 FINAL  Final  MRSA PCR Screening     Status: None   Collection Time: 11/12/18  5:10 AM   Specimen: Nasal Mucosa; Nasopharyngeal  Result Value Ref Range Status   MRSA by PCR NEGATIVE NEGATIVE Final    Comment:        The GeneXpert MRSA Assay (FDA approved for NASAL specimens only), is one component  of a comprehensive MRSA colonization surveillance program. It is not intended to diagnose MRSA infection nor to guide or monitor  treatment for MRSA infections. Performed at [facility], [place] [address] [place], [place] [address]      Radiology Studies: No results found.  Scheduled Meds: . apixaban  2.5 mg Oral BID  . aspirin  81 mg Oral Daily  . atorvastatin  80 mg Oral q1800  . calcium carbonate  1 tablet Oral TID WC  . cholestyramine  4 g Oral Daily  . [START ON 11/19/2018] feeding supplement (ENSURE ENLIVE)  237 mL Oral BID BM  . ferrous sulfate  325 mg Oral Q breakfast  . folic acid  0.5 mg Oral Daily  . gabapentin  100 mg Oral Daily  . mouth rinse  15 mL Mouth Rinse BID  . Melatonin  6 mg Oral QHS  . mirtazapine  7.5 mg Oral QHS  . pantoprazole  40 mg Oral Daily  . sertraline  100 mg Oral Daily  . tamsulosin  0.4 mg Oral Daily   Continuous Infusions: .  sodium bicarbonate  infusion 1000 mL 75 mL/hr at 11/18/18 1800     LOS: 7 days   Marylu Lund, MD [facility] Pager On Amion  If 7PM-7AM, please contact night-coverage 11/18/2018, 6:47 PM

## 2018-11-19 DIAGNOSIS — J181 Lobar pneumonia, unspecified organism: Secondary | ICD-10-CM

## 2018-11-19 LAB — RENAL FUNCTION PANEL
Albumin: 2.5 g/dL — ABNORMAL LOW (ref 3.5–5.0)
Anion gap: 8 (ref 5–15)
BUN: 47 mg/dL — ABNORMAL HIGH (ref 8–23)
CO2: 17 mmol/L — ABNORMAL LOW (ref 22–32)
Calcium: 7.9 mg/dL — ABNORMAL LOW (ref 8.9–10.3)
Chloride: 113 mmol/L — ABNORMAL HIGH (ref 98–111)
Creatinine, Ser: 4.37 mg/dL — ABNORMAL HIGH (ref 0.61–1.24)
GFR calc Af Amer: 15 mL/min — ABNORMAL LOW (ref >60)
GFR calc non Af Amer: 13 mL/min — ABNORMAL LOW (ref >60)
Glucose, Bld: 97 mg/dL (ref 70–99)
Phosphorus: 4.9 mg/dL — ABNORMAL HIGH (ref 2.5–4.6)
Potassium: 4.4 mmol/L (ref 3.5–5.1)
Sodium: 138 mmol/L (ref 135–145)

## 2018-11-19 LAB — CBC
HCT: 27.2 % — ABNORMAL LOW (ref 39.0–52.0)
Hemoglobin: 8.2 g/dL — ABNORMAL LOW (ref 13.0–17.0)
MCH: 27.7 pg (ref 26.0–34.0)
MCHC: 30.1 g/dL (ref 30.0–36.0)
MCV: 91.9 fL (ref 80.0–100.0)
Platelets: 180 10*3/uL (ref 150–400)
RBC: 2.96 MIL/uL — ABNORMAL LOW (ref 4.22–5.81)
RDW: 13.9 % (ref 11.5–15.5)
WBC: 8.6 10*3/uL (ref 4.0–10.5)
nRBC: 0 % (ref 0.0–0.2)

## 2018-11-19 MED ORDER — ENSURE ENLIVE PO LIQD
237.0000 mL | Freq: Two times a day (BID) | ORAL | Status: DC
  Administered 2018-11-20 (×2): 237 mL via ORAL

## 2018-11-19 MED ORDER — SODIUM CHLORIDE 0.9 % IV SOLN
510.0000 mg | Freq: Once | INTRAVENOUS | Status: AC
  Administered 2018-11-19: 510 mg via INTRAVENOUS
  Filled 2018-11-19: qty 17

## 2018-11-19 MED ORDER — SODIUM CHLORIDE 0.9 % IV BOLUS
500.0000 mL | Freq: Once | INTRAVENOUS | Status: AC
  Administered 2018-11-19: 500 mL via INTRAVENOUS

## 2018-11-19 MED ORDER — PRO-STAT SUGAR FREE PO LIQD
30.0000 mL | Freq: Three times a day (TID) | ORAL | Status: DC
  Administered 2018-11-19 – 2018-11-25 (×17): 30 mL via ORAL
  Filled 2018-11-19 (×17): qty 30

## 2018-11-19 MED ORDER — ADULT MULTIVITAMIN W/MINERALS CH
1.0000 | ORAL_TABLET | Freq: Every day | ORAL | Status: DC
  Administered 2018-11-19 – 2018-11-25 (×7): 1 via ORAL
  Filled 2018-11-19 (×7): qty 1

## 2018-11-19 NOTE — Progress Notes (Signed)
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 fib, chronic left arm flaccid paralysis and long-term nursing home resident presenting to the emergency room with abnormal lab test, feeling weak for about a week. Patient is poor historian. Reviewed his medical records from previous hospitalization. Also reviewed records from nursing home. Patient himself tells that he stays out a lot so he might have gotten dehydrated. He denies any nausea or vomiting. He says his appetite is good. No loose stool noted in colostomy. Patient states that he is urinating but cannot tell me when was the last time he had any urine. Denies any retention of urine or abdominal pain. He does have chronic wound from abdominal wall, currently under wound care. Patient says he does not smoke, however nursing home reported that he goes out in the sun to smoke and he stays outside long time and it was very hot outside. Today morning, he was noted to be more and more weaker. Usually he would transfer from bed to chair and walk with support and now he can barely walk or barely stand. He also seem to be confused than normal. So they did a lab test today where his BMP showed creatinine of 9.47, last creatinine in March 2020 was 1.78. No reports of fever. No other resident would COVID-19 at the nursing home.   Assessment & Plan:   Principal Problem:   Acute renal failure (HCC) Active Problems:   HTN (hypertension)   CKD (chronic kidney disease) stage 3, GFR 30-59 ml/min (HCC)   HLD  (hyperlipidemia)   Elevated troponin   Left hemiparesis (HCC)   Acute encephalopathy   Acute kidney injury (AKI) with acute tubular necrosis (ATN) (HCC)  Acute renal failure with probable underlying chronic kidney disease stage III: - etiology unknown - s/p fluid bolus and now on bicarb gtt; HCO3- up to 10 this morning - Neprhology consulted, will see today - foley in place - continue bicarb for now; follow up UOP - Bicarbimproved - ostomy output is picking up; on cholestyramine     - Recheck bmet in AM  Elevated troponins - With significant acute kidney injury.  - Denies any chest pain/EKG is nonischemic. 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  - CT head is normal; continue to monitor. - PT/OTconsulted - PT recs noted with recommendation for SNF  Hypertension - metoprolol held initially    - BP currently stable  Acute metabolic encephalopathy - Suspect due to #1; Will need continuous monitoring and treatment for the cause. - he's A&O x 3, appropriate interaction     - Remains stable at this time  SIRS due to noninfectious cause:  - Blood pressures are stable.  - Lactic acid was normal.  - No localizing infection on clinical examination.  - Patient is a stabilizing.  - He was given loading dose of broad-spectrum antibiotics in the emergency room. Given severe renal failure and alternative explanation of the disease, will hold further antibiotics. - procal is negative; lactic acid remains normal - question  is he really this dehydrated; looks good clinically, continuing fluids; no abx at this time. - Presently stable  History of PE:  - Remains on Eliquis, seems stable  Hx of midline abdominal wound - WOCN;  continue current care -This AM, dressings in place, dry, and intact  Hypokalemia - normalized     - repeat bmet in AM  Mild hypocalcemia - TUMS w/ meals - stable this AM at 7.9   DVT prophylaxis: Eliquis Code Status: Full Family Communication: Pt in room, family not at bedside Disposition Plan: Uncertain at this time  Consultants:   Nephrology  Procedures:     Antimicrobials: Anti-infectives (From admission, onward)   Start     Dose/Rate Route Frequency Ordered Stop   11/11/18 1430  ceFEPIme (MAXIPIME) 2 g in sodium chloride 0.9 % 100 mL IVPB     2 g 200 mL/hr over 30 Minutes Intravenous STAT 11/11/18 1420 11/11/18 1523   11/11/18 1430  vancomycin (VANCOCIN) 1,500 mg in sodium chloride 0.9 % 500 mL IVPB     1,500 mg 250 mL/hr over 120 Minutes Intravenous STAT 11/11/18 1420 11/11/18 2151   11/11/18 1415  azithromycin (ZITHROMAX) 500 mg in sodium chloride 0.9 % 250 mL IVPB     500 mg 250 mL/hr over 60 Minutes Intravenous  Once 11/11/18 1414 11/12/18 0036      Subjective: No complaints this AM  Objective: Vitals:   11/18/18 1409 11/18/18 2226 11/19/18 0640 11/19/18 1300  BP: 130/68 (!) 126/58 (!) 141/62 135/65  Pulse: 75 75 (!) 57 68  Resp: 16 16 18 17   Temp: 98.2 F (36.8 C) 98.5 F (36.9 C) 98.4 F (36.9 C) 98.7 F (37.1 C)  TempSrc: Oral Oral Oral Oral  SpO2: 97% 96% 100% 100%  Weight:      Height:        Intake/Output Summary (Last 24 hours) at 11/19/2018 1720 Last data filed at 11/19/2018 1627 Gross per 24 hour  Intake 2101.22 ml  Output 1650 ml  Net 451.22 ml   Filed Weights   11/15/18 0542 11/17/18 0632 11/18/18 0604  Weight: 86.9 kg 84.6 kg 84.4 kg    Examination: General exam: Awake, laying in bed, in nad Respiratory system: Normal respiratory effort, no wheezing Cardiovascular system: regular rate, s1, s2 Gastrointestinal system: Soft, nondistended, positive BS Central nervous system: CN2-12 grossly intact, strength  intact Extremities: Perfused, no clubbing Skin: Normal skin turgor, no notable skin lesions seen, abd dressings in place, dry and clean Psychiatry: Mood normal // no visual hallucinations   Data Reviewed: I have personally reviewed following labs and imaging studies  CBC: Recent Labs  Lab 11/13/18 0430 11/14/18 0519 11/16/18 0417 11/18/18 0444 11/19/18 0447  WBC 7.7 8.0 8.6 8.8 8.6  NEUTROABS  --  6.4 6.9 6.9  --   HGB 8.4* 8.6* 10.6* 9.1* 8.2*  HCT 25.6* 26.9* 35.1* 29.9* 27.2*  MCV 87.7 88.5 92.1 91.4 91.9  PLT 165 152 196 190 626   Basic Metabolic Panel: Recent Labs  Lab 11/14/18 0519 11/15/18 0849 11/16/18 0417 11/17/18 0410 11/18/18 0444 11/19/18 0447  NA 140 140 140 136 139 138  K 3.2* 3.8 4.0 4.1 4.7 4.4  CL 109 106 108 112* 115* 113*  CO2 19* 20* 18* 16* 15* 17*  GLUCOSE 106* 125* 103* 84 81 97  BUN 80* 73* 66* 57* 53* 47*  CREATININE 7.71* 7.26* 6.54* 5.78* 5.07* 4.37*  CALCIUM 6.8* 7.5* 7.7* 7.8* 8.0* 7.9*  MG 1.7  --  1.8  --  1.8  --   PHOS 7.2*  --   --   --  5.3* 4.9*   GFR: Estimated Creatinine Clearance: 16.8 mL/min (A) (by C-G formula based on SCr of 4.37 mg/dL (H)). Liver Function Tests: Recent Labs  Lab 11/13/18 0430 11/14/18 0519 11/18/18 0444 11/19/18 0447  AST 25  --   --   --   ALT 11  --   --   --   ALKPHOS 63  --   --   --   BILITOT 0.5  --   --   --   PROT 5.0*  --   --   --   ALBUMIN 2.3* 2.5* 2.6* 2.5*   No results for input(s): LIPASE, AMYLASE in the last 168 hours. No results for input(s): AMMONIA in the last 168 hours. Coagulation Profile: No results for input(s): INR, PROTIME in the last 168 hours. Cardiac Enzymes: No results for input(s): CKTOTAL, CKMB, CKMBINDEX, TROPONINI in the last 168 hours. BNP (last 3 results) No results for input(s): PROBNP in the last 8760 hours. HbA1C: No results for input(s): HGBA1C in the last 72 hours. CBG: No results for input(s): GLUCAP in the last 168 hours. Lipid Profile: No  results for input(s): CHOL, HDL, LDLCALC, TRIG, CHOLHDL, LDLDIRECT in the last 72 hours. Thyroid Function Tests: No results for input(s): TSH, T4TOTAL, FREET4, T3FREE, THYROIDAB in the last 72 hours. Anemia Panel: Recent Labs    11/18/18 1756  FERRITIN 125  TIBC 271  IRON 58   Sepsis Labs: Recent Labs  Lab 11/13/18 0430  PROCALCITON <0.10    Recent Results (from the past 240 hour(s))  Culture, blood (routine x 2)     Status: None   Collection Time: 11/11/18  1:04 PM   Specimen: BLOOD RIGHT FOREARM  Result Value Ref Range Status   Specimen Description   Final    BLOOD RIGHT FOREARM Performed at [facility], [place] [address] [place], [place] [address]    Special Requests   Final    BOTTLES DRAWN AEROBIC ONLY Blood Culture results may not be optimal due to an inadequate volume of blood received in culture bottles Performed at [facility] [address], [place], [place] [address]    Culture   Final    NO GROWTH 5 DAYS Performed at [facility], [place] [address] [place], [place] [address]    Report Status 11/16/2018 FINAL  Final  SARS Coronavirus 2 (CEPHEID - Performed in [facility] hospital lab), Hosp Order     Status: None   Collection Time: 11/11/18  1:04 PM   Specimen: Nasopharyngeal Swab  Result Value Ref Range Status   SARS Coronavirus 2 NEGATIVE NEGATIVE Final    Comment: (NOTE) If result is NEGATIVE SARS-CoV-2 target nucleic acids are NOT DETECTED. The SARS-CoV-2 RNA is generally detectable in upper and lower  respiratory specimens during the acute phase of infection. The lowest  concentration of SARS-CoV-2 viral copies this assay can detect is 250  copies / mL. A negative result does not preclude SARS-CoV-2 infection  and should not be used as the sole basis for treatment or other  patient management decisions.  A negative result may occur with  improper specimen collection / handling, submission of specimen other  than  nasopharyngeal swab, presence of viral mutation(s) within the  areas targeted by this assay, and inadequate number of viral copies  (<250 copies / mL). A negative result must  be combined with clinical  observations, patient history, and epidemiological information. If result is POSITIVE SARS-CoV-2 target nucleic acids are DETECTED. The SARS-CoV-2 RNA is generally detectable in upper and lower  respiratory specimens dur ing the acute phase of infection.  Positive  results are indicative of active infection with SARS-CoV-2.  Clinical  correlation with patient history and other diagnostic information is  necessary to determine patient infection status.  Positive results do  not rule out bacterial infection or co-infection with other viruses. If result is PRESUMPTIVE POSTIVE SARS-CoV-2 nucleic acids MAY BE PRESENT.   A presumptive positive result was obtained on the submitted specimen  and confirmed on repeat testing.  While 2019 novel coronavirus  (SARS-CoV-2) nucleic acids may be present in the submitted sample  additional confirmatory testing may be necessary for epidemiological  and / or clinical management purposes  to differentiate between  SARS-CoV-2 and other Sarbecovirus currently known to infect humans.  If clinically indicated additional testing with an alternate test  methodology [id number]) is advised. The SARS-CoV-2 RNA is generally  detectable in upper and lower respiratory sp ecimens during the acute  phase of infection. The expected result is Negative. Fact Sheet for Patients:  StrictlyIdeas.no Fact Sheet for Healthcare Providers: BankingDealers.co.za This test is not yet approved or cleared by the [place] FDA and has been authorized for detection and/or diagnosis of SARS-CoV-2 by FDA under an Emergency Use Authorization (EUA).  This EUA will remain in effect (meaning this test can be used) for the duration of  the COVID-19 declaration under Section 564(b)(1) of the Act, 21 U.S.C. section 360bbb-3(b)(1), unless the authorization is terminated or revoked sooner. Performed at [facility], [place] [address], [place], [place] [address]   Culture, blood (routine x 2)     Status: None   Collection Time: 11/11/18  4:51 PM   Specimen: BLOOD  Result Value Ref Range Status   Specimen Description   Final    BLOOD RIGHT ANTECUBITAL Performed at [facility] [address] [place], [place] [address]    Special Requests   Final    BOTTLES DRAWN AEROBIC AND ANAEROBIC Blood Culture adequate volume Performed at [facility] [address], [place], [place] [address]    Culture   Final    NO GROWTH 5 DAYS Performed at [facility], [place] [address] [place], [place] [address]    Report Status 11/16/2018 FINAL  Final  Urine culture     Status: None   Collection Time: 11/11/18  4:51 PM   Specimen: In/Out Cath Urine  Result Value Ref Range Status   Specimen Description   Final    IN/OUT CATH URINE Performed at [facility] [address] [place], [place] [address]    Special Requests   Final    NONE Performed at [facility], [place] [address] [place], [place] [address]    Culture   Final    NO GROWTH Performed at [facility], [place] [address] [place],  [address]    Report Status 11/12/2018 FINAL  Final  MRSA PCR Screening     Status: None   Collection Time: 11/12/18  5:10 AM   Specimen: Nasal Mucosa; Nasopharyngeal  Result Value Ref Range Status   MRSA by PCR NEGATIVE NEGATIVE Final    Comment:        The GeneXpert MRSA Assay (FDA approved for NASAL specimens only), is one component of  a comprehensive MRSA colonization surveillance program. It is not intended to diagnose MRSA infection nor to guide or monitor treatment for MRSA infections. Performed at [facility], [place] [address], [place], [place] [address]      Radiology Studies: No results found.  Scheduled Meds: . apixaban  2.5 mg Oral BID  . aspirin  81 mg Oral Daily  . atorvastatin  80 mg Oral q1800  . calcium carbonate  1 tablet Oral TID WC  . cholestyramine  4 g Oral Daily  . feeding supplement (ENSURE ENLIVE)  237 mL Oral BID BM  . feeding supplement (PRO-STAT SUGAR FREE 64)  30 mL Oral TID BM  . ferrous sulfate  325 mg Oral Q breakfast  . folic acid  0.5 mg Oral Daily  . gabapentin  100 mg Oral Daily  . mouth rinse  15 mL Mouth Rinse BID  . Melatonin  6 mg Oral QHS  . mirtazapine  7.5 mg Oral QHS  . multivitamin with minerals  1 tablet Oral Daily  . pantoprazole  40 mg Oral Daily  . sertraline  100 mg Oral Daily  . tamsulosin  0.4 mg Oral Daily   Continuous Infusions: . ferumoxytol 510 mg (11/19/18 1716)  .  sodium bicarbonate  infusion 1000 mL 100 mL/hr at 11/19/18 1628     LOS: 8 days   Marylu Lund, MD [facility] Pager On Amion  If 7PM-7AM, please contact night-coverage 11/19/2018, 5:20 PM

## 2018-11-19 NOTE — Progress Notes (Signed)
Initial Nutrition Assessment  RD working remotely.   DOCUMENTATION CODES:   (unable to assess for malnutrition at this time.)  INTERVENTION:  - continue Ensure Enlive BID, each supplement provides 350 kcal and 20 grams of protein. - will order 30 mL Prostat TID, each supplement provides 100 kcal and 15 grams of protein. - will order daily multivitamin with minerals. - continue to encourage PO intakes.     NUTRITION DIAGNOSIS:   Increased nutrient needs related to acute illness, chronic illness, wound healing as evidenced by estimated needs.  GOAL:   Patient will meet greater than or equal to 90% of their needs  MONITOR:   PO intake, Supplement acceptance, Labs, Weight trends, Skin  REASON FOR ASSESSMENT:   Malnutrition Screening Tool  ASSESSMENT:   68 y.o. male with medical history significant of extensive hospitalization, HTN, hyperlipidemia, PE currently on Eliquis, SMA obstruction and mesenteric s/p laparotomy and emergent surgery, post-op respiratory failure with tracheostomy now closed and subglottic stenosis, post-op cardiac arrest with V. fib, and chronic left arm flaccid paralysis. He is a long-term nursing home resident who presented to the ED with abnormal lab test and feeling weak for about a week. Patient is poor historian. Patient reported that he stays outside a lot so he might have become dehydrated. Denied any retention of urine. He has a chronic wound from abdominal wall, currently under wound care. Patient denied smoking but nursing home staff indicated that patient does smoke. On the day of admission he was noted to be weaker to usual. He is usually able to transfer from the bed to the chair and walk with support, but he was unable to walk and barely able to stand.  Patient was admitted on 7/22. Per RN flow sheet, he recently consumed 25% of breakfast, 0% of lunch, and 30% of dinner on 7/24; 100% of breakfast on 7/25; 40% of lunch on 7/26; 45% of dinner on 7/27;  100% of breakfast on 7/29. Ensure Enlive was ordered BID to start today.   Per chart review, current weight is 186 lb and weight on 03/30/18 was 194 lb. This indicates 8 lb weight loss (4% body weight) in the past 7 months; not significant for time frame. Of note, weight on 10/09/17 was recorded as 252 lb indicating 66 lb weight loss (26% body weight) in the past 11 months; significant for time frame.   Per notes: - ARF with probable underlying CKD stage 3--Nephrology following - increasing ostomy output (this is a positive) - AKI with associated elevated troponins - old CVA with L flaccid paralysis - acute metabolic encephalopathy - SIRS--stable   Labs reviewed; Cl: 113 mmol/l, BUN: 47 mg/dl, creatinine: 4.37 mg/dl, Ca: 7.9 mg/dl, Phos: 4.9 mg/dl, GR: 13 ml/min.  Medications reviewed; 325 mg ferrous sulfate/day, 0.5 mg folvite/day, 6 mg melatonin/day. IVF; D5-150 mEq sodium bicarb @ 75 ml/hr (306 kcal).     NUTRITION - FOCUSED PHYSICAL EXAM:  unable to complete at this time.   Diet Order:   Diet Order            Diet 2 gram sodium Room service appropriate? Yes; Fluid consistency: Thin  Diet effective now              EDUCATION NEEDS:   Not appropriate for education at this time  Skin:  Skin Assessment: Skin Integrity Issues: Skin Integrity Issues:: Other (Comment) Other: chronic abdominal wound--WOC note 7/23 indicates that this is a full thickness wound through which mesh is protruding  Last BM:  7/29  Height:   Ht Readings from Last 1 Encounters:  11/12/18 5\' 7"  (1.702 m)    Weight:   Wt Readings from Last 1 Encounters:  11/18/18 84.4 kg    Ideal Body Weight:  67.3 kg  BMI:  Body mass index is 29.14 kg/m.  Estimated Nutritional Needs:   Kcal:  2360-2530 kcal  Protein:  120-130 grams  Fluid:  >/= 2.5 L/day     Jarome Matin, MS, RD, LDN, [facility] Inpatient Clinical Dietitian Pager # [phone] After hours/weekend pager # [phone]

## 2018-11-19 NOTE — Progress Notes (Signed)
[facility] Progress Note  Subjective:  Feels ok - encouraged that his numbers are better.  He has been on bicarb gtt at 75/hr.  He had 300 mL stool over 7/29 charted however copious amounts of stool in bag on exam 7/29 and this may not have been charted.  He had 1.3 liters UOP over 7/29.     Review of systems:   Denies shortness of breath or chest pain; on oxygen at facility  Denies n/v  Baseline left-sided neuro deficits with stroke.   Vitals:   11/18/18 0604 11/18/18 1409 11/18/18 2226 11/19/18 0640  BP: (!) 131/57 130/68 (!) 126/58 (!) 141/62  Pulse: (!) 58 75 75 (!) 57  Resp: 16 16 16 18   Temp: 98 F (36.7 C) 98.2 F (36.8 C) 98.5 F (36.9 C) 98.4 F (36.9 C)  TempSrc: Oral Oral Oral Oral  SpO2: 98% 97% 96% 100%  Weight: 84.4 kg     Height:        Inpatient medications: . apixaban  2.5 mg Oral BID  . aspirin  81 mg Oral Daily  . atorvastatin  80 mg Oral q1800  . calcium carbonate  1 tablet Oral TID WC  . cholestyramine  4 g Oral Daily  . feeding supplement (ENSURE ENLIVE)  237 mL Oral BID BM  . feeding supplement (PRO-STAT SUGAR FREE 64)  30 mL Oral TID BM  . ferrous sulfate  325 mg Oral Q breakfast  . folic acid  0.5 mg Oral Daily  . gabapentin  100 mg Oral Daily  . mouth rinse  15 mL Mouth Rinse BID  . Melatonin  6 mg Oral QHS  . mirtazapine  7.5 mg Oral QHS  . multivitamin with minerals  1 tablet Oral Daily  . pantoprazole  40 mg Oral Daily  . sertraline  100 mg Oral Daily  . tamsulosin  0.4 mg Oral Daily   .  sodium bicarbonate  infusion 1000 mL 75 mL/hr at 11/19/18 3428   acetaminophen **OR** acetaminophen, albuterol, diphenoxylate-atropine, ondansetron **OR** ondansetron (ZOFRAN) IV    Exam: Gen chron ill appearing adult male  Chest clear bilat and unlabored Heart S1S2 no rub  Abd abdominal wound dressing is clean; RLQ ostomy is full - paged for bag change GU foley cath in place Ext no edema appreciated lower extremities; trace LUE  edema improving Neuro is awake, Ox 3 , L arm paresis    Home meds:  - apixaban 2.5 bid  - atorvastatin 80 qd/ aspirin 81/ cholestyramine 4g qd  - tamsulosin 0.4/ omeprazole 40 qd  - sertraline 100 qd/ mirtazapine 7.5 hs/ gabapentin 100 qd  - O2 2L [place]  - metoprolol 12.5 bid  - prn's/ vitamins/ supplements   CXR > 7/22 IMPRESSION: Left base atelectasis or early infiltrate.  Renal US > 13cm R kidney, no hydro, +^echo.  6.8cm L kidney, cortical thinning and ^^echo, no hydro.   ECHO: LVEF 60%, MV regurg appears to have worsened but images are poor  UA 7.22 > few bact, 21-50 rbc and 6- 10 wbc, prot neg  UNa 14,  UCr 116    Assessment/ Plan: 1. AKI on CKD3 - one kidney is atrophic and prob not functional at all.    Hypotension/ vol depletion in part the cause of AKI.  Pre-renal insults of prolonged heat exposure.  Cannot rule out obstruction component as improving s/p foley as well.  Reported baseline creat is 1.4. Peak creat here 9.4 on admission.  Slowly resolving with supportive care and nonoliguric.  Marginal long-term HD candidate; hopeful for recovery without RRT.  Normal saline bolus once now then continue bicarb gtt.  Corrected ca ok.  On flomax and continue foley catheter  2. Metabolic acidosis - continue bicarb gtt    3. Hypokalemia - resolved  4. HTN - acceptable, off metoprolol; avoid hypotension  5. Chron abd wound 6. Pyuria - cx's negative  7. H/o CVA w/ left sided weakness 8. Chronic O2 nasal cannula 9. Chronic debility / SNF dependence - hx of CVA , not sure baseline mobility 10. Anemianomocytic - PRBC's per primary team.  Feraheme x 1.  May be in part secondary to AKI.      Iron/TIBC/Ferritin/ %Sat    Component Value Date/Time   IRON 58 11/18/2018 1756   TIBC 271 11/18/2018 1756   FERRITIN 125 11/18/2018 1756   IRONPCTSAT 21 11/18/2018 1756   Recent Labs  Lab 11/19/18 0447  NA 138  K 4.4  CL 113*  CO2 17*  GLUCOSE 97  BUN 47*  CREATININE 4.37*   CALCIUM 7.9*  PHOS 4.9*  ALBUMIN 2.5*   Recent Labs  Lab 11/13/18 0430  AST 25  ALT 11  ALKPHOS 63  BILITOT 0.5  PROT 5.0*   Recent Labs  Lab 11/19/18 0447  WBC 8.6  HGB 8.2*  HCT 27.2*  PLT 180    Claudia Desanctis 11/19/2018 2:28 PM

## 2018-11-20 LAB — RENAL FUNCTION PANEL
Albumin: 2.4 g/dL — ABNORMAL LOW (ref 3.5–5.0)
Anion gap: 6 (ref 5–15)
BUN: 43 mg/dL — ABNORMAL HIGH (ref 8–23)
CO2: 23 mmol/L (ref 22–32)
Calcium: 7.8 mg/dL — ABNORMAL LOW (ref 8.9–10.3)
Chloride: 110 mmol/L (ref 98–111)
Creatinine, Ser: 4.21 mg/dL — ABNORMAL HIGH (ref 0.61–1.24)
GFR calc Af Amer: 16 mL/min — ABNORMAL LOW (ref >60)
GFR calc non Af Amer: 14 mL/min — ABNORMAL LOW (ref >60)
Glucose, Bld: 153 mg/dL — ABNORMAL HIGH (ref 70–99)
Phosphorus: 5.4 mg/dL — ABNORMAL HIGH (ref 2.5–4.6)
Potassium: 4.5 mmol/L (ref 3.5–5.1)
Sodium: 139 mmol/L (ref 135–145)

## 2018-11-20 MED ORDER — CHOLESTYRAMINE LIGHT 4 G PO PACK
4.0000 g | PACK | Freq: Every day | ORAL | Status: DC
  Administered 2018-11-20 – 2018-11-25 (×6): 4 g via ORAL
  Filled 2018-11-20 (×6): qty 1

## 2018-11-20 MED ORDER — CALCIUM GLUCONATE-NACL 1-0.675 GM/50ML-% IV SOLN
1.0000 g | Freq: Once | INTRAVENOUS | Status: AC
  Administered 2018-11-20: 1000 mg via INTRAVENOUS
  Filled 2018-11-20: qty 50

## 2018-11-20 MED ORDER — SODIUM BICARBONATE 650 MG PO TABS
1300.0000 mg | ORAL_TABLET | Freq: Two times a day (BID) | ORAL | Status: DC
  Administered 2018-11-20 – 2018-11-22 (×4): 1300 mg via ORAL
  Filled 2018-11-20 (×4): qty 2

## 2018-11-20 MED ORDER — SODIUM CHLORIDE 0.9 % IV SOLN
INTRAVENOUS | Status: DC
  Administered 2018-11-20 – 2018-11-22 (×5): via INTRAVENOUS

## 2018-11-20 MED ORDER — CHOLESTYRAMINE 4 G PO PACK
4.0000 g | PACK | Freq: Every day | ORAL | Status: DC
  Filled 2018-11-20: qty 1

## 2018-11-20 MED ORDER — DARBEPOETIN ALFA 60 MCG/0.3ML IJ SOSY
60.0000 ug | PREFILLED_SYRINGE | Freq: Once | INTRAMUSCULAR | Status: AC
  Administered 2018-11-20: 22:00:00 60 ug via SUBCUTANEOUS
  Filled 2018-11-20: qty 0.3

## 2018-11-20 NOTE — Progress Notes (Signed)
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 fib, chronic left arm flaccid paralysis and long-term nursing home resident presenting to the emergency room with abnormal lab test, feeling weak for about a week. Patient is poor historian. Reviewed his medical records from previous hospitalization. Also reviewed records from nursing home. Patient himself tells that he stays out a lot so he might have gotten dehydrated. He denies any nausea or vomiting. He says his appetite is good. No loose stool noted in colostomy. Patient states that he is urinating but cannot tell me when was the last time he had any urine. Denies any retention of urine or abdominal pain. He does have chronic wound from abdominal wall, currently under wound care. Patient says he does not smoke, however nursing home reported that he goes out in the sun to smoke and he stays outside long time and it was very hot outside. Today morning, he was noted to be more and more weaker. Usually he would transfer from bed to chair and walk with support and now he can barely walk or barely stand. He also seem to be confused than normal. So they did a lab test today where his BMP showed creatinine of 9.47, last creatinine in March 2020 was 1.78. No reports of fever. No other resident would COVID-19 at the nursing home.   Assessment & Plan:   Principal Problem:   Acute renal failure (HCC) Active Problems:   HTN (hypertension)   CKD (chronic kidney disease) stage 3, GFR 30-59 ml/min (HCC)   HLD  (hyperlipidemia)   Elevated troponin   Left hemiparesis (HCC)   Acute encephalopathy   Acute kidney injury (AKI) with acute tubular necrosis (ATN) (HCC)  Acute renal failure with probable underlying chronic kidney disease stage III: - suspect secondary to hypotension and dehydration - Neprhology consulted, following - foley in place - continue bicarb for now, PO - Recheck bmet in AM    - Per Nephrology, transition to NS\   -Pt does not want HD  Elevated troponins - With significant acute kidney injury.  - Denies any chest pain/EKG is nonischemic. 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  - CT head is normal; continue to monitor. - PT/OT following with recs for SNF when discharged  Hypertension - metoprolol held initially    - BP presently stable  Acute metabolic encephalopathy - Suspect due to #1; Will need continuous monitoring and treatment for the cause. - he's A&O x 3, appropriate interaction     - Remains stable currently  SIRS due to noninfectious cause:  - Blood pressures are stable.  - Lactic acid was normal.  - No localizing infection on clinical examination.  - Patient is a stabilizing.  - He was given loading dose of broad-spectrum antibiotics in the emergency room. Given severe renal failure and alternative explanation of the disease, will hold further antibiotics.     - Afebrile, non-toxic appearing - Currently stable  History of PE:  - Remains on Eliquis, seems stable  Hx of midline  abdominal wound - WOCN; continue current care - Stable presently  Hypokalemia - normalized     - recheck bmet in AM  Mild hypocalcemia - TUMS w/ meals - stable this AM at 7.8   DVT prophylaxis: Eliquis Code Status:  Full Family Communication: Pt in room, family not at bedside Disposition Plan: Uncertain at this time  Consultants:   Nephrology  Procedures:     Antimicrobials: Anti-infectives (From admission, onward)   Start     Dose/Rate Route Frequency Ordered Stop   11/11/18 1430  ceFEPIme (MAXIPIME) 2 g in sodium chloride 0.9 % 100 mL IVPB     2 g 200 mL/hr over 30 Minutes Intravenous STAT 11/11/18 1420 11/11/18 1523   11/11/18 1430  vancomycin (VANCOCIN) 1,500 mg in sodium chloride 0.9 % 500 mL IVPB     1,500 mg 250 mL/hr over 120 Minutes Intravenous STAT 11/11/18 1420 11/11/18 2151   11/11/18 1415  azithromycin (ZITHROMAX) 500 mg in sodium chloride 0.9 % 250 mL IVPB     500 mg 250 mL/hr over 60 Minutes Intravenous  Once 11/11/18 1414 11/12/18 0036      Subjective: Without complaints  Objective: Vitals:   11/19/18 1300 11/19/18 2147 11/20/18 0615 11/20/18 1255  BP: 135/65 (!) 121/51 126/62 129/70  Pulse: 68 62 61 70  Resp: 17 18 16 18   Temp: 98.7 F (37.1 C) 98.6 F (37 C) 98.2 F (36.8 C) 98.7 F (37.1 C)  TempSrc: Oral Oral Oral Oral  SpO2: 100% 97% 99% 100%  Weight:   87.6 kg   Height:        Intake/Output Summary (Last 24 hours) at 11/20/2018 1655 Last data filed at 11/20/2018 1619 Gross per 24 hour  Intake 3803.01 ml  Output 1500 ml  Net 2303.01 ml   Filed Weights   11/17/18 3212 11/18/18 0604 11/20/18 0615  Weight: 84.6 kg 84.4 kg 87.6 kg    Examination: General exam: Conversant, in no acute distress Respiratory system: normal chest rise, clear, no audible wheezing Cardiovascular system: regular rhythm, s1-s2 Gastrointestinal system: Nondistended, nontender, pos BS Central nervous system: No seizures, no tremors Extremities: No cyanosis, no joint deformities Skin: No rashes, no pallor Psychiatry: Affect normal // no auditory hallucinations   Data Reviewed: I have personally reviewed following labs and imaging studies  CBC: Recent Labs  Lab  11/14/18 0519 11/16/18 0417 11/18/18 0444 11/19/18 0447  WBC 8.0 8.6 8.8 8.6  NEUTROABS 6.4 6.9 6.9  --   HGB 8.6* 10.6* 9.1* 8.2*  HCT 26.9* 35.1* 29.9* 27.2*  MCV 88.5 92.1 91.4 91.9  PLT 152 196 190 248   Basic Metabolic Panel: Recent Labs  Lab 11/14/18 0519  11/16/18 0417 11/17/18 0410 11/18/18 0444 11/19/18 0447 11/20/18 0425  NA 140   < > 140 136 139 138 139  K 3.2*   < > 4.0 4.1 4.7 4.4 4.5  CL 109   < > 108 112* 115* 113* 110  CO2 19*   < > 18* 16* 15* 17* 23  GLUCOSE 106*   < > 103* 84 81 97 153*  BUN 80*   < > 66* 57* 53* 47* 43*  CREATININE 7.71*   < > 6.54* 5.78* 5.07* 4.37* 4.21*  CALCIUM 6.8*   < > 7.7* 7.8* 8.0* 7.9* 7.8*  MG 1.7  --  1.8  --  1.8  --   --   PHOS 7.2*  --   --   --  5.3* 4.9* 5.4*   < > =  values in this interval not displayed.   GFR: Estimated Creatinine Clearance: 17.7 mL/min (A) (by C-G formula based on SCr of 4.21 mg/dL (H)). Liver Function Tests: Recent Labs  Lab 11/14/18 0519 11/18/18 0444 11/19/18 0447 11/20/18 0425  ALBUMIN 2.5* 2.6* 2.5* 2.4*   No results for input(s): LIPASE, AMYLASE in the last 168 hours. No results for input(s): AMMONIA in the last 168 hours. Coagulation Profile: No results for input(s): INR, PROTIME in the last 168 hours. Cardiac Enzymes: No results for input(s): CKTOTAL, CKMB, CKMBINDEX, TROPONINI in the last 168 hours. BNP (last 3 results) No results for input(s): PROBNP in the last 8760 hours. HbA1C: No results for input(s): HGBA1C in the last 72 hours. CBG: No results for input(s): GLUCAP in the last 168 hours. Lipid Profile: No results for input(s): CHOL, HDL, LDLCALC, TRIG, CHOLHDL, LDLDIRECT in the last 72 hours. Thyroid Function Tests: No results for input(s): TSH, T4TOTAL, FREET4, T3FREE, THYROIDAB in the last 72 hours. Anemia Panel: Recent Labs    11/18/18 1756  FERRITIN 125  TIBC 271  IRON 58   Sepsis Labs: No results for input(s): PROCALCITON, LATICACIDVEN in the last 168  hours.  Recent Results (from the past 240 hour(s))  Culture, blood (routine x 2)     Status: None   Collection Time: 11/11/18  1:04 PM   Specimen: BLOOD RIGHT FOREARM  Result Value Ref Range Status   Specimen Description   Final    BLOOD RIGHT FOREARM Performed at [facility], [address], [place], [place] [address]    Special Requests   Final    BOTTLES DRAWN AEROBIC ONLY Blood Culture results may not be optimal due to an inadequate volume of blood received in culture bottles Performed at [place] [address] [place], [place] [address]    Culture   Final    NO GROWTH 5 DAYS Performed at [facility], [place] [address], [place], [place] [address]    Report Status 11/16/2018 FINAL  Final  SARS Coronavirus 2 (CEPHEID - Performed in [place] hospital lab), Hosp Order     Status: None   Collection Time: 11/11/18  1:04 PM   Specimen: Nasopharyngeal Swab  Result Value Ref Range Status   SARS Coronavirus 2 NEGATIVE NEGATIVE Final    Comment: (NOTE) If result is NEGATIVE SARS-CoV-2 target nucleic acids are NOT DETECTED. The SARS-CoV-2 RNA is generally detectable in upper and lower  respiratory specimens during the acute phase of infection. The lowest  concentration of SARS-CoV-2 viral copies this assay can detect is 250  copies / mL. A negative result does not preclude SARS-CoV-2 infection  and should not be used as the sole basis for treatment or other  patient management decisions.  A negative result may occur with  improper specimen collection / handling, submission of specimen other  than nasopharyngeal swab, presence of viral mutation(s) within the  areas targeted by this assay, and inadequate number of viral copies  (<250 copies / mL). A negative result must be combined with clinical  observations, patient history, and epidemiological information. If result is POSITIVE SARS-CoV-2 target nucleic acids are DETECTED. The SARS-CoV-2 RNA  is generally detectable in upper and lower  respiratory specimens dur ing the acute phase of infection.  Positive  results are indicative of active infection with SARS-CoV-2.  Clinical  correlation with patient history and other diagnostic information is  necessary to determine patient infection status.  Positive results do  not rule out bacterial infection or co-infection with other viruses. If result is PRESUMPTIVE POSTIVE SARS-CoV-2 nucleic acids MAY BE PRESENT.   A presumptive positive result was obtained on the submitted specimen  and confirmed on repeat testing.  While 2019 novel coronavirus  (SARS-CoV-2) nucleic acids may be present in the submitted sample  additional confirmatory testing may be necessary for epidemiological  and / or clinical management purposes  to differentiate between  SARS-CoV-2 and other Sarbecovirus currently known to infect humans.  If clinically indicated additional testing with an alternate test  methodology [phone]) is advised. The SARS-CoV-2 RNA is generally  detectable in upper and lower respiratory sp ecimens during the acute  phase of infection. The expected result is Negative. Fact Sheet for Patients:  StrictlyIdeas.no Fact Sheet for Healthcare Providers: BankingDealers.co.za This test is not yet approved or cleared by the [place] FDA and has been authorized for detection and/or diagnosis of SARS-CoV-2 by FDA under an Emergency Use Authorization (EUA).  This EUA will remain in effect (meaning this test can be used) for the duration of the COVID-19 declaration under Section 564(b)(1) of the Act, 21 U.S.C. section 360bbb-3(b)(1), unless the authorization is terminated or revoked sooner. Performed at [facility], [place] [address] [place], [place] [address]   Culture, blood (routine x 2)     Status: None   Collection Time: 11/11/18  4:51 PM   Specimen: BLOOD  Result Value  Ref Range Status   Specimen Description   Final    BLOOD RIGHT ANTECUBITAL Performed at [place] [address], [place], [place] [address]    Special Requests   Final    BOTTLES DRAWN AEROBIC AND ANAEROBIC Blood Culture adequate volume Performed at [place] [address], [place], [place] [address]    Culture   Final    NO GROWTH 5 DAYS Performed at [facility], [place] [address], [place], [place] [address]    Report Status 11/16/2018 FINAL  Final  Urine culture     Status: None   Collection Time: 11/11/18  4:51 PM   Specimen: In/Out Cath Urine  Result Value Ref Range Status   Specimen Description   Final    IN/OUT CATH URINE Performed at [place] [address] [place], [place] [address]    Special Requests   Final    NONE Performed at [facility], [place] [address], [place], [place] [address]    Culture   Final    NO GROWTH Performed at [facility], [place] [address], [place], [place] [address]    Report Status 11/12/2018 FINAL  Final  MRSA PCR Screening     Status: None   Collection Time: 11/12/18  5:10 AM   Specimen: Nasal Mucosa; Nasopharyngeal  Result Value Ref Range Status   MRSA by PCR NEGATIVE NEGATIVE Final    Comment:        The GeneXpert MRSA Assay (FDA approved for NASAL specimens only), is one component of a comprehensive MRSA colonization surveillance program. It is not intended to diagnose MRSA infection nor to guide or monitor treatment for MRSA infections. Performed at [facility], [place] [address] [place],  [address]      Radiology Studies: No results found.  Scheduled Meds: . apixaban  2.5 mg Oral BID  . aspirin  81 mg Oral Daily  . atorvastatin  80 mg Oral q1800  . calcium carbonate  1 tablet Oral TID WC  . cholestyramine light  4 g Oral Daily  . darbepoetin (ARANESP) injection - NON-DIALYSIS  60 mcg Subcutaneous  Once  . feeding supplement (ENSURE ENLIVE)  237 mL Oral BID BM  . feeding supplement (PRO-STAT SUGAR FREE 64)  30 mL Oral TID BM  . ferrous sulfate  325 mg Oral Q breakfast  . folic acid  0.5 mg Oral Daily  . gabapentin  100 mg Oral Daily  . mouth rinse  15 mL Mouth Rinse BID  . mirtazapine  7.5 mg Oral QHS  . multivitamin with minerals  1 tablet Oral Daily  . pantoprazole  40 mg Oral Daily  . sertraline  100 mg Oral Daily  . sodium bicarbonate  1,300 mg Oral BID  . tamsulosin  0.4 mg Oral Daily   Continuous Infusions: . sodium chloride    . calcium gluconate       LOS: 9 days   Marylu Lund, MD [facility] Pager On Amion  If 7PM-7AM, please contact night-coverage 11/20/2018, 4:55 PM

## 2018-11-20 NOTE — Progress Notes (Signed)
[facility] Progress Note  Subjective:  He has been on bicarb gtt at 100 ml/hr.  He had had 1.2 liters UOP over 7/30.  He had 2.2 liters stool over 7/30 as well.  Feels ok.  He states "I would want no part in dialysis - that is not a life I want.  I took my neighbor to dialysis".  He has no history of cancer.   Review of systems: Denies shortness of breath or chest pain; on oxygen at facility but he clarifies that is typically only at night Denies n/v  Baseline left-sided neuro deficits with stroke.   Vitals:   11/19/18 1300 11/19/18 2147 11/20/18 0615 11/20/18 1255  BP: 135/65 (!) 121/51 126/62 129/70  Pulse: 68 62 61 70  Resp: 17 18 16 18   Temp: 98.7 F (37.1 C) 98.6 F (37 C) 98.2 F (36.8 C) 98.7 F (37.1 C)  TempSrc: Oral Oral Oral Oral  SpO2: 100% 97% 99% 100%  Weight:   87.6 kg   Height:        Inpatient medications: . apixaban  2.5 mg Oral BID  . aspirin  81 mg Oral Daily  . atorvastatin  80 mg Oral q1800  . calcium carbonate  1 tablet Oral TID WC  . cholestyramine light  4 g Oral Daily  . feeding supplement (ENSURE ENLIVE)  237 mL Oral BID BM  . feeding supplement (PRO-STAT SUGAR FREE 64)  30 mL Oral TID BM  . ferrous sulfate  325 mg Oral Q breakfast  . folic acid  0.5 mg Oral Daily  . gabapentin  100 mg Oral Daily  . mouth rinse  15 mL Mouth Rinse BID  . mirtazapine  7.5 mg Oral QHS  . multivitamin with minerals  1 tablet Oral Daily  . pantoprazole  40 mg Oral Daily  . sertraline  100 mg Oral Daily  . tamsulosin  0.4 mg Oral Daily   .  sodium bicarbonate  infusion 1000 mL 100 mL/hr at 11/20/18 1111   acetaminophen **OR** acetaminophen, albuterol, diphenoxylate-atropine, ondansetron **OR** ondansetron (ZOFRAN) IV    Exam: Gen chron ill appearing adult male  Chest clear bilat and unlabored Heart S1S2 no rub  Abd abdominal wound dressing is clean and intact; RLQ ostomy intact with moderate stool  GU foley cath in place Ext no edema  appreciated lower extremities; trace LUE edema improving Neuro is awake, Ox 3 , L arm paresis    Home meds:  - apixaban 2.5 bid  - atorvastatin 80 qd/ aspirin 81/ cholestyramine 4g qd  - tamsulosin 0.4/ omeprazole 40 qd  - sertraline 100 qd/ mirtazapine 7.5 hs/ gabapentin 100 qd  - O2 2L [place]  - metoprolol 12.5 bid  - prn's/ vitamins/ supplements   CXR > 7/22 IMPRESSION: Left base atelectasis or early infiltrate.  Renal US > 13cm R kidney, no hydro, +^echo.  6.8cm L kidney, cortical thinning and ^^echo, no hydro.   ECHO: LVEF 60%, MV regurg appears to have worsened but images are poor  UA 7.22 > few bact, 21-50 rbc and 6- 10 wbc, prot neg  UNa 14,  UCr 116    Assessment/ Plan: 1. AKI on CKD3 - one kidney is atrophic and prob not functional at all.    Hypotension/ vol depletion in part the cause of AKI.  Pre-renal insults of prolonged heat exposure.  Cannot rule out obstruction component as improving s/p foley as well.  Reported baseline creat is 1.4. Peak creat here  9.4 on admission.  Slowly resolving with supportive care and nonoliguric.  Marginal long-term HD candidate and he has also clarified that he would never want dialysis as above; hopeful for recovery without RRT.  Continue fluids - transition to normal saline.  On flomax and continue foley catheter  2. Metabolic acidosis - supplemental bicarbonate - transition to PO bicarbonate 3. Hypokalemia - resolved  4. HTN - acceptable, off metoprolol; avoid hypotension  5. Chron abd wound 6. Pyuria - cx's negative  7. H/o CVA w/ left sided weakness 8. Chronic hypoxemic resp failure - on oxygen at SNF at baseline  9. Chronic debility / SNF dependence - hx of CVA , not sure baseline mobility 10. Anemianomocytic - PRBC's per primary team.  S/p Feraheme x 1.  May be in part secondary to AKI.  CBC in AM.  Aranesp 60 mcg x 1.     Iron/TIBC/Ferritin/ %Sat    Component Value Date/Time   IRON 58 11/18/2018 1756   TIBC 271 11/18/2018  1756   FERRITIN 125 11/18/2018 1756   IRONPCTSAT 21 11/18/2018 1756   Recent Labs  Lab 11/20/18 0425  NA 139  K 4.5  CL 110  CO2 23  GLUCOSE 153*  BUN 43*  CREATININE 4.21*  CALCIUM 7.8*  PHOS 5.4*  ALBUMIN 2.4*   No results for input(s): AST, ALT, ALKPHOS, BILITOT, PROT in the last 168 hours. Recent Labs  Lab 11/19/18 0447  WBC 8.6  HGB 8.2*  HCT 27.2*  PLT 180    Claudia Desanctis 11/20/2018 4:30 PM

## 2018-11-20 NOTE — Progress Notes (Signed)
Physical Therapy Treatment Patient Details Name: Luke Kaufman MRN: [id number] DOB: Mar 03, 1951 Today's Date: 11/20/2018    History of Present Illness 68 y.o. 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 fib, chronic left arm flaccid paralysis, stroke, and long-term nursing home resident and admitted for acute renal failure.    PT Comments    Pt reports being weaker today since remaining in bed most of the week however assisted to standing with mod assist and pivoted to recliner with min assist.  Pt fatigued quickly.  Pt performed LE exercises once in recliner and agreeable to remain upright in recliner end of session.  SPO2 remained in upper 90s on room air during session.    Follow Up Recommendations  SNF     Equipment Recommendations  None recommended by PT    Recommendations for Other Services       Precautions / Restrictions Precautions Precautions: Fall Precaution Comments: L sided residual deficits Restrictions Weight Bearing Restrictions: No    Mobility  Bed Mobility Overal bed mobility: Needs Assistance Bed Mobility: Supine to Sit     Supine to sit: Supervision;HOB elevated     General bed mobility comments: utilized elevated HOB and bed rail  Transfers Overall transfer level: Needs assistance Equipment used: Rolling walker (2 wheeled) Transfers: Sit to/from [facility] Sit to Stand: Mod assist Stand pivot transfers: Min assist       General transfer comment: verbal cues for technique including hand placement and weight shifting; assisted with positioning and maintaining of L UE as pt unable to grip with left hand, assist to rise and steady as  well as control descent  Ambulation/Gait                 Stairs             Wheelchair Mobility    Modified Rankin (Stroke Patients Only)       Balance Overall balance assessment: Needs assistance Sitting-balance support: No upper extremity supported;Feet supported Sitting balance-Leahy Scale: Fair     Standing balance support: Bilateral upper extremity supported Standing balance-Leahy Scale: Poor                              Cognition Arousal/Alertness: Awake/alert Behavior During Therapy: WFL for tasks assessed/performed Overall Cognitive Status: Within Functional Limits for tasks assessed                                        Exercises General Exercises - Lower Extremity Ankle Circles/Pumps: AROM;10 reps;Both;Seated Long Arc Quad: AROM;Both;10 reps;Seated Hip ABduction/ADduction: AROM;Both;Supine;10 reps Hip Flexion/Marching: AROM;Seated;Both;10 reps    General Comments        Pertinent Vitals/Pain Pain Assessment: No/denies pain Pain Intervention(s): Repositioned;Monitored during session    Home Living                      Prior Function            PT Goals (current goals can now be found in the care plan section) Acute Rehab PT Goals PT Goal Formulation: With patient Time For Goal Achievement: 12/03/18 Potential to Achieve Goals: Good  Progress towards PT goals: Progressing toward goals    Frequency    Min 2X/week      PT Plan Current plan remains appropriate    Co-evaluation              AM-PAC PT "6 Clicks" Mobility   Outcome Measure  Help needed turning from your back to your side while in a flat bed without using bedrails?: A Little Help needed moving from lying on your back to sitting on the side of a flat bed without using bedrails?: A Little Help needed moving to and from a bed to a chair (including a wheelchair)?: A Lot Help needed standing up from a chair using your arms  (e.g., wheelchair or bedside chair)?: A Lot Help needed to walk in hospital room?: A Lot Help needed climbing 3-5 steps with a railing? : A Lot 6 Click Score: 14    End of Session Equipment Utilized During Treatment: Gait belt Activity Tolerance: Patient limited by fatigue Patient left: with call bell/phone within reach;in chair;with chair alarm set   PT Visit Diagnosis: Other abnormalities of gait and mobility (R26.89)     Time: 4142-3953 PT Time Calculation (min) (ACUTE ONLY): 26 min  Charges:  $Therapeutic Exercise: 8-22 mins $Therapeutic Activity: 8-22 mins                     Carmelia Bake, PT, DPT Acute Rehabilitation Services Office: [phone] Pager: [phone]  Trena Platt 11/20/2018, 3:50 PM

## 2018-11-21 LAB — CBC
HCT: 25.2 % — ABNORMAL LOW (ref 39.0–52.0)
Hemoglobin: 7.8 g/dL — ABNORMAL LOW (ref 13.0–17.0)
MCH: 28.4 pg (ref 26.0–34.0)
MCHC: 31 g/dL (ref 30.0–36.0)
MCV: 91.6 fL (ref 80.0–100.0)
Platelets: 182 10*3/uL (ref 150–400)
RBC: 2.75 MIL/uL — ABNORMAL LOW (ref 4.22–5.81)
RDW: 14 % (ref 11.5–15.5)
WBC: 8.6 10*3/uL (ref 4.0–10.5)
nRBC: 0 % (ref 0.0–0.2)

## 2018-11-21 LAB — RENAL FUNCTION PANEL
Albumin: 2.4 g/dL — ABNORMAL LOW (ref 3.5–5.0)
Anion gap: 7 (ref 5–15)
BUN: 44 mg/dL — ABNORMAL HIGH (ref 8–23)
CO2: 21 mmol/L — ABNORMAL LOW (ref 22–32)
Calcium: 8 mg/dL — ABNORMAL LOW (ref 8.9–10.3)
Chloride: 111 mmol/L (ref 98–111)
Creatinine, Ser: 3.7 mg/dL — ABNORMAL HIGH (ref 0.61–1.24)
GFR calc Af Amer: 18 mL/min — ABNORMAL LOW (ref >60)
GFR calc non Af Amer: 16 mL/min — ABNORMAL LOW (ref >60)
Glucose, Bld: 85 mg/dL (ref 70–99)
Phosphorus: 3.5 mg/dL (ref 2.5–4.6)
Potassium: 5.1 mmol/L (ref 3.5–5.1)
Sodium: 139 mmol/L (ref 135–145)

## 2018-11-21 MED ORDER — DIPHENHYDRAMINE HCL 25 MG PO CAPS
25.0000 mg | ORAL_CAPSULE | Freq: Once | ORAL | Status: AC
  Administered 2018-11-22: 25 mg via ORAL
  Filled 2018-11-21: qty 1

## 2018-11-21 MED ORDER — TRAZODONE HCL 50 MG PO TABS: 50.0000 mg | ORAL_TABLET | Freq: Once | ORAL | Status: DC

## 2018-11-21 MED ORDER — BOOST / RESOURCE BREEZE PO LIQD CUSTOM
1.0000 | Freq: Every day | ORAL | Status: DC
  Administered 2018-11-21 – 2018-11-23 (×2): 1 via ORAL

## 2018-11-21 NOTE — Progress Notes (Signed)
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 fib, chronic left arm flaccid paralysis and long-term nursing home resident presenting to the emergency room with abnormal lab test, feeling weak for about a week. Patient is poor historian. Reviewed his medical records from previous hospitalization. Also reviewed records from nursing home. Patient himself tells that he stays out a lot so he might have gotten dehydrated. He denies any nausea or vomiting. He says his appetite is good. No loose stool noted in colostomy. Patient states that he is urinating but cannot tell me when was the last time he had any urine. Denies any retention of urine or abdominal pain. He does have chronic wound from abdominal wall, currently under wound care. Patient says he does not smoke, however nursing home reported that he goes out in the sun to smoke and he stays outside long time and it was very hot outside. Today morning, he was noted to be more and more weaker. Usually he would transfer from bed to chair and walk with support and now he can barely walk or barely stand. He also seem to be confused than normal. So they did a lab test today where his BMP showed creatinine of 9.47, last creatinine in March 2020 was 1.78. No reports of fever. No other resident would COVID-19 at the nursing home.   Assessment & Plan:   Principal Problem:   Acute renal failure (HCC) Active Problems:   HTN (hypertension)   CKD (chronic kidney disease) stage 3, GFR 30-59 ml/min (HCC)   HLD  (hyperlipidemia)   Elevated troponin   Left hemiparesis (HCC)   Acute encephalopathy   Acute kidney injury (AKI) with acute tubular necrosis (ATN) (HCC)  Acute renal failure with probable underlying chronic kidney disease stage III: - suspect secondary to hypotension and dehydration - Neprhology consulted, following - foley in place - continue bicarb for now, PO - Cr. Today 3.7. Recheck bmet in AM    - Per Nephrology, possible voiding trial tomorrow   - Pt is not interested in HD  Elevated troponins - With significant acute kidney injury.  - Denies any chest pain/EKG is nonischemic. 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  - CT head is normal; continue to monitor. - PT/OT following with recs for SNF at time of discharge  Hypertension - metoprolol held initially    - BP currently stable  Acute metabolic encephalopathy - Suspect due to #1; Will need continuous monitoring and treatment for the cause. - he's A&O x 3, appropriate interaction     - Remains stable presently  SIRS due to noninfectious cause:  - Blood pressures are stable.  - Lactic acid was normal.  - No localizing infection on clinical examination.  - Patient is a stabilizing.  - He was given loading dose of broad-spectrum antibiotics in the emergency room. Given severe renal failure and alternative explanation of the disease, will hold further antibiotics.     - Afebrile, non-toxic appearing - Currently stable  History of PE:  - Remains  on Eliquis, tolerating thus far  Hx of midline abdominal wound - WOCN; continue current care - Stable currently  Hypokalemia - normalized     - recheck bmet in AM  Mild hypocalcemia - TUMS w/ meals - stable this AM at  8.0   DVT prophylaxis: Eliquis Code Status: Full Family Communication: Pt in room, family not at bedside Disposition Plan: Uncertain at this time  Consultants:   Nephrology  Procedures:     Antimicrobials: Anti-infectives (From admission, onward)   Start     Dose/Rate Route Frequency Ordered Stop   11/11/18 1430  ceFEPIme (MAXIPIME) 2 g in sodium chloride 0.9 % 100 mL IVPB     2 g 200 mL/hr over 30 Minutes Intravenous STAT 11/11/18 1420 11/11/18 1523   11/11/18 1430  vancomycin (VANCOCIN) 1,500 mg in sodium chloride 0.9 % 500 mL IVPB     1,500 mg 250 mL/hr over 120 Minutes Intravenous STAT 11/11/18 1420 11/11/18 2151   11/11/18 1415  azithromycin (ZITHROMAX) 500 mg in sodium chloride 0.9 % 250 mL IVPB     500 mg 250 mL/hr over 60 Minutes Intravenous  Once 11/11/18 1414 11/12/18 0036      Subjective: In good spirits, knowing his Cr is improving  Objective: Vitals:   11/20/18 2205 11/21/18 0500 11/21/18 0611 11/21/18 1447  BP: (!) 120/54  (!) 118/49 (!) 119/49  Pulse: 74  65 63  Resp: 18  20 19   Temp: 98.3 F (36.8 C)  98.6 F (37 C) 97.7 F (36.5 C)  TempSrc: Oral  Oral Oral  SpO2: 98%  92% 96%  Weight:  86.9 kg    Height:        Intake/Output Summary (Last 24 hours) at 11/21/2018 1824 Last data filed at 11/21/2018 1700 Gross per 24 hour  Intake 2207.15 ml  Output 4275 ml  Net -2067.85 ml   Filed Weights   11/18/18 0604 11/20/18 0615 11/21/18 0500  Weight: 84.4 kg 87.6 kg 86.9 kg    Examination: General exam: Awake, laying in bed, in nad Respiratory system: Normal respiratory effort, no wheezing Cardiovascular system: regular rate, s1, s2 Gastrointestinal system: Soft, nondistended, positive BS Central nervous system: CN2-12 grossly intact, strength intact Extremities: Perfused, no clubbing Skin: Normal skin turgor, no notable skin lesions seen Psychiatry: Mood normal // no visual hallucinations   Data Reviewed: I have personally reviewed following  labs and imaging studies  CBC: Recent Labs  Lab 11/16/18 0417 11/18/18 0444 11/19/18 0447 11/21/18 0549  WBC 8.6 8.8 8.6 8.6  NEUTROABS 6.9 6.9  --   --   HGB 10.6* 9.1* 8.2* 7.8*  HCT 35.1* 29.9* 27.2* 25.2*  MCV 92.1 91.4 91.9 91.6  PLT 196 190 180 710   Basic Metabolic Panel: Recent Labs  Lab 11/16/18 0417 11/17/18 0410 11/18/18 0444 11/19/18 0447 11/20/18 0425 11/21/18 0549  NA 140 136 139 138 139 139  K 4.0 4.1 4.7 4.4 4.5 5.1  CL 108 112* 115* 113* 110 111  CO2 18* 16* 15* 17* 23 21*  GLUCOSE 103* 84 81 97 153* 85  BUN 66* 57* 53* 47* 43* 44*  CREATININE 6.54* 5.78* 5.07* 4.37* 4.21* 3.70*  CALCIUM 7.7* 7.8* 8.0* 7.9* 7.8* 8.0*  MG 1.8  --  1.8  --   --   --   PHOS  --   --  5.3* 4.9* 5.4* 3.5   GFR: Estimated Creatinine Clearance: 20.1 mL/min (A) (by C-G formula based on SCr of 3.7 mg/dL (H)).  Liver Function Tests: Recent Labs  Lab 11/18/18 0444 11/19/18 0447 11/20/18 0425 11/21/18 0549  ALBUMIN 2.6* 2.5* 2.4* 2.4*   No results for input(s): LIPASE, AMYLASE in the last 168 hours. No results for input(s): AMMONIA in the last 168 hours. Coagulation Profile: No results for input(s): INR, PROTIME in the last 168 hours. Cardiac Enzymes: No results for input(s): CKTOTAL, CKMB, CKMBINDEX, TROPONINI in the last 168 hours. BNP (last 3 results) No results for input(s): PROBNP in the last 8760 hours. HbA1C: No results for input(s): HGBA1C in the last 72 hours. CBG: No results for input(s): GLUCAP in the last 168 hours. Lipid Profile: No results for input(s): CHOL, HDL, LDLCALC, TRIG, CHOLHDL, LDLDIRECT in the last 72 hours. Thyroid Function Tests: No results for input(s): TSH, T4TOTAL, FREET4, T3FREE, THYROIDAB in the last 72 hours. Anemia Panel: No results for input(s): VITAMINB12, FOLATE, FERRITIN, TIBC, IRON, RETICCTPCT in the last 72 hours. Sepsis Labs: No results for input(s): PROCALCITON, LATICACIDVEN in the last 168 hours.  Recent Results (from  the past 240 hour(s))  MRSA PCR Screening     Status: None   Collection Time: 11/12/18  5:10 AM   Specimen: Nasal Mucosa; Nasopharyngeal  Result Value Ref Range Status   MRSA by PCR NEGATIVE NEGATIVE Final    Comment:        The GeneXpert MRSA Assay (FDA approved for NASAL specimens only), is one component of a comprehensive MRSA colonization surveillance program. It is not intended to diagnose MRSA infection nor to guide or monitor treatment for MRSA infections. Performed at [facility], [place] [address], [place], [place] [address]      Radiology Studies: No results found.  Scheduled Meds: . apixaban  2.5 mg Oral BID  . aspirin  81 mg Oral Daily  . atorvastatin  80 mg Oral q1800  . calcium carbonate  1 tablet Oral TID WC  . cholestyramine light  4 g Oral Daily  . feeding supplement  1 Container Oral QHS  . feeding supplement (PRO-STAT SUGAR FREE 64)  30 mL Oral TID BM  . ferrous sulfate  325 mg Oral Q breakfast  . folic acid  0.5 mg Oral Daily  . gabapentin  100 mg Oral Daily  . mouth rinse  15 mL Mouth Rinse BID  . mirtazapine  7.5 mg Oral QHS  . multivitamin with minerals  1 tablet Oral Daily  . pantoprazole  40 mg Oral Daily  . sertraline  100 mg Oral Daily  . sodium bicarbonate  1,300 mg Oral BID  . tamsulosin  0.4 mg Oral Daily   Continuous Infusions: . sodium chloride 100 mL/hr at 11/21/18 1334     LOS: 10 days   Marylu Lund, MD [facility] Pager On Amion  If 7PM-7AM, please contact night-coverage 11/21/2018, 6:24 PM

## 2018-11-21 NOTE — Progress Notes (Signed)
[facility] Progress Note  Subjective:  He has been on normal saline at 100 ml/hr.  He had had 1.5 liters UOP over 7/31.  He had 3.7 liters stool charted over 7/31 as well.  We discussed declining hemoglobin and the risks, benefits, and indications for transfusion.  He has no acute need for blood today but he would consent to blood transfusion if needed.   Review of systems:  No shortness of breath or chest pain  No nausea or vomiting   Vitals:   11/20/18 1255 11/20/18 2205 11/21/18 0500 11/21/18 0611  BP: 129/70 (!) 120/54  (!) 118/49  Pulse: 70 74  65  Resp: 18 18  20   Temp: 98.7 F (37.1 C) 98.3 F (36.8 C)  98.6 F (37 C)  TempSrc: Oral Oral  Oral  SpO2: 100% 98%  92%  Weight:   86.9 kg   Height:        Inpatient medications: . apixaban  2.5 mg Oral BID  . aspirin  81 mg Oral Daily  . atorvastatin  80 mg Oral q1800  . calcium carbonate  1 tablet Oral TID WC  . cholestyramine light  4 g Oral Daily  . feeding supplement  1 Container Oral QHS  . feeding supplement (PRO-STAT SUGAR FREE 64)  30 mL Oral TID BM  . ferrous sulfate  325 mg Oral Q breakfast  . folic acid  0.5 mg Oral Daily  . gabapentin  100 mg Oral Daily  . mouth rinse  15 mL Mouth Rinse BID  . mirtazapine  7.5 mg Oral QHS  . multivitamin with minerals  1 tablet Oral Daily  . pantoprazole  40 mg Oral Daily  . sertraline  100 mg Oral Daily  . sodium bicarbonate  1,300 mg Oral BID  . tamsulosin  0.4 mg Oral Daily   . sodium chloride 100 mL/hr at 11/21/18 1334   acetaminophen **OR** acetaminophen, albuterol, diphenoxylate-atropine, ondansetron **OR** ondansetron (ZOFRAN) IV    Exam: Gen chron ill appearing adult male  Chest clear bilat and unlabored Heart S1S2 no rub  Abd abdominal wound dressing is intact; RLQ ostomy intact with stool   GU foley cath in place Ext no edema appreciated lower extremities; LUE edema  Neuro is awake, Ox 3 , L arm paresis    Home meds:  - apixaban 2.5  bid  - atorvastatin 80 qd/ aspirin 81/ cholestyramine 4g qd  - tamsulosin 0.4/ omeprazole 40 qd  - sertraline 100 qd/ mirtazapine 7.5 hs/ gabapentin 100 qd  - O2 2L [place]  - metoprolol 12.5 bid  - prn's/ vitamins/ supplements   CXR > 7/22 IMPRESSION: Left base atelectasis or early infiltrate.  Renal US > 13cm R kidney, no hydro, +^echo.  6.8cm L kidney, cortical thinning and ^^echo, no hydro.   ECHO: LVEF 60%, MV regurg appears to have worsened but images are poor  UA 7.22 > few bact, 21-50 rbc and 6- 10 wbc, prot neg  UNa 14,  UCr 116    Assessment/ Plan: 1. AKI on CKD3 - Secondary to pre-renal insults and hypotension in the setting of solitary functioning kidney.  (one kidney is atrophic and likely not functional).  Pre-renal insults of prolonged heat exposure as well as ostomy in situ.  Cannot rule out obstruction component as has improved s/p foley as well.  Reported baseline creat is 1.4. Peak creat here 9.4 on admission.  Slowly resolving with supportive care and nonoliguric.  Marginal long-term HD candidate  and he has also clarified that he would never want dialysis ("I would want no part in dialysis - that is not a life I want.  I took my neighbor to dialysis").  - Continue normal saline.   - On flomax and continue foley catheter - If creatinine continues to fall plan for voiding trial tomorrow   2. Metabolic acidosis - on PO bicarbonate 3. Hypokalemia - resolved and now with K rising.  Changed to renal diet and discussed with pt.  4. HTN - acceptable, off metoprolol; avoid hypotension  5. Chron abd wound - bled earlier in the week; team is following 6. Anemianomocytic - s/p Feraheme x 1.  May be in part secondary to AKI as well as prior bleeding with abdominal wound. CBC in AM. S/p Aranesp 60 mcg x 1.  He would want blood if he needed it and we discussed anticipated need for transfusion soon.  7. Pyuria - cx's negative  8. H/o CVA w/ left sided weakness 9. Chronic hypoxemic  resp failure - on oxygen at SNF at baseline at night 10. Chronic debility / SNF dependence - hx of CVA , not sure baseline mobility    Iron/TIBC/Ferritin/ %Sat    Component Value Date/Time   IRON 58 11/18/2018 1756   TIBC 271 11/18/2018 1756   FERRITIN 125 11/18/2018 1756   IRONPCTSAT 21 11/18/2018 1756   Recent Labs  Lab 11/21/18 0549  NA 139  K 5.1  CL 111  CO2 21*  GLUCOSE 85  BUN 44*  CREATININE 3.70*  CALCIUM 8.0*  PHOS 3.5  ALBUMIN 2.4*   No results for input(s): AST, ALT, ALKPHOS, BILITOT, PROT in the last 168 hours. Recent Labs  Lab 11/21/18 0549  WBC 8.6  HGB 7.8*  HCT 25.2*  PLT 182    Claudia Desanctis 11/21/2018 2:12 PM

## 2018-11-21 NOTE — Progress Notes (Signed)
Patient requested medication to help him sleep tonight. PCP was notified.

## 2018-11-21 NOTE — Progress Notes (Signed)
Nutrition Follow-up  RD working remotely.  DOCUMENTATION CODES:   (unable to assess for malnutrition at this time.)  INTERVENTION:   -D/c Ensure Enlive -Continue MVI with minerals daily -Continue 30 ml Prostat TID, each supplement provides 100 kcals and 15 grams protein -Boost Breeze po q HS, each supplement provides 250 kcal and 9 grams of protein  NUTRITION DIAGNOSIS:   Increased nutrient needs related to acute illness, chronic illness, wound healing as evidenced by estimated needs.  Ongoing  GOAL:   Patient will meet greater than or equal to 90% of their needs  Progressing   MONITOR:   PO intake, Supplement acceptance, Labs, Weight trends, Skin  REASON FOR ASSESSMENT:   Malnutrition Screening Tool    ASSESSMENT:   68 y.o. male with medical history significant of extensive hospitalization, HTN, hyperlipidemia, PE currently on Eliquis, SMA obstruction and mesenteric s/p laparotomy and emergent surgery, post-op respiratory failure with tracheostomy now closed and subglottic stenosis, post-op cardiac arrest with V. fib, and chronic left arm flaccid paralysis. He is a long-term nursing home resident who presented to the ED with abnormal lab test and feeling weak for about a week. Patient is poor historian. Patient reported that he stays outside a lot so he might have become dehydrated. Denied any retention of urine. He has a chronic wound from abdominal wall, currently under wound care. Patient denied smoking but nursing home staff indicated that patient does smoke. On the day of admission he was noted to be weaker to usual. He is usually able to transfer from the bed to the chair and walk with support, but he was unable to walk and barely able to stand.  Reviewed I/O's: -1.9 L x 24 hours and -6.2 L since admission  UOP: 1.5 L x 24 hours  Ileostomy output: 3.7 L x 24 hours  Paged by RN to clarify supplement regimen. She reports that pt was transitioned to a renal diet due  to elevated creatinine and renal function/creatinine has trended down in the past 24 hours. RN reports pt is tolerating meals and supplements well; noted documented meal completion has improved since last visit (PO: 25-100%). RN requesting RD re-evaluate supplement regimen due to renal function and improved oral intake.   Per nephrology notes, one kidney is atrophic and probably not functional at all. Nephrology hopeful for continued improved renal function (pt is a poor HD candidate). Per RN, pt refusing any form of HD whether it be long or short term.   Per PT notes, currently recommending SNF at discharge.   Medications reviewed and include folic acid, remeron, calcium carbonate, and 0.9% sodium chloride @ 100 ml/hr.   Labs reviewed: Phos WDL.   Diet Order:   Diet Order            Diet renal with fluid restriction Fluid restriction: Other (see comments); Room service appropriate? Yes; Fluid consistency: Thin  Diet effective now              EDUCATION NEEDS:   Not appropriate for education at this time  Skin:  Skin Assessment: Skin Integrity Issues: Skin Integrity Issues:: Other (Comment) Other: chronic abdominal wound--WOC note 7/23 indicates that this is a full thickness wound through which mesh is protruding  Last BM:  11/21/18 (250 ml output via ileostomy)  Height:   Ht Readings from Last 1 Encounters:  11/12/18 5\' 7"  (1.702 m)    Weight:   Wt Readings from Last 1 Encounters:  11/21/18 86.9 kg  Ideal Body Weight:  67.3 kg  BMI:  Body mass index is 30.01 kg/m.  Estimated Nutritional Needs:   Kcal:  2150-2350  Protein:  105-120 grams  Fluid:  > 2.0 L    Yumiko Alkins A. Jimmye Norman, RD, LDN, [place] Registered Dietitian II Certified Diabetes Care and Education Specialist Pager: [phone] After hours Pager: [phone]

## 2018-11-22 LAB — CBC
HCT: 25.5 % — ABNORMAL LOW (ref 39.0–52.0)
Hemoglobin: 7.6 g/dL — ABNORMAL LOW (ref 13.0–17.0)
MCH: 27.9 pg (ref 26.0–34.0)
MCHC: 29.8 g/dL — ABNORMAL LOW (ref 30.0–36.0)
MCV: 93.8 fL (ref 80.0–100.0)
Platelets: 158 10*3/uL (ref 150–400)
RBC: 2.72 MIL/uL — ABNORMAL LOW (ref 4.22–5.81)
RDW: 14.2 % (ref 11.5–15.5)
WBC: 7.4 10*3/uL (ref 4.0–10.5)
nRBC: 0 % (ref 0.0–0.2)

## 2018-11-22 LAB — RENAL FUNCTION PANEL
Albumin: 2.3 g/dL — ABNORMAL LOW (ref 3.5–5.0)
Anion gap: 10 (ref 5–15)
BUN: 43 mg/dL — ABNORMAL HIGH (ref 8–23)
CO2: 15 mmol/L — ABNORMAL LOW (ref 22–32)
Calcium: 7.7 mg/dL — ABNORMAL LOW (ref 8.9–10.3)
Chloride: 115 mmol/L — ABNORMAL HIGH (ref 98–111)
Creatinine, Ser: 3.45 mg/dL — ABNORMAL HIGH (ref 0.61–1.24)
GFR calc Af Amer: 20 mL/min — ABNORMAL LOW (ref >60)
GFR calc non Af Amer: 17 mL/min — ABNORMAL LOW (ref >60)
Glucose, Bld: 108 mg/dL — ABNORMAL HIGH (ref 70–99)
Phosphorus: 3.9 mg/dL (ref 2.5–4.6)
Potassium: 4.5 mmol/L (ref 3.5–5.1)
Sodium: 140 mmol/L (ref 135–145)

## 2018-11-22 MED ORDER — CALCIUM GLUCONATE-NACL 1-0.675 GM/50ML-% IV SOLN
1.0000 g | Freq: Once | INTRAVENOUS | Status: AC
  Administered 2018-11-22: 1000 mg via INTRAVENOUS
  Filled 2018-11-22: qty 50

## 2018-11-22 MED ORDER — SODIUM BICARBONATE 8.4 % IV SOLN
INTRAVENOUS | Status: DC
  Administered 2018-11-22: 19:00:00 via INTRAVENOUS
  Filled 2018-11-22 (×2): qty 150

## 2018-11-22 MED ORDER — SODIUM CHLORIDE 0.9 % IV SOLN
INTRAVENOUS | Status: DC | PRN
  Administered 2018-11-22: 250 mL via INTRAVENOUS

## 2018-11-22 NOTE — Progress Notes (Signed)
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 fib, chronic left arm flaccid paralysis and long-term nursing home resident presenting to the emergency room with abnormal lab test, feeling weak for about a week. Patient is poor historian. Reviewed his medical records from previous hospitalization. Also reviewed records from nursing home. Patient himself tells that he stays out a lot so he might have gotten dehydrated. He denies any nausea or vomiting. He says his appetite is good. No loose stool noted in colostomy. Patient states that he is urinating but cannot tell me when was the last time he had any urine. Denies any retention of urine or abdominal pain. He does have chronic wound from abdominal wall, currently under wound care. Patient says he does not smoke, however nursing home reported that he goes out in the sun to smoke and he stays outside long time and it was very hot outside. Today morning, he was noted to be more and more weaker. Usually he would transfer from bed to chair and walk with support and now he can barely walk or barely stand. He also seem to be confused than normal. So they did a lab test today where his BMP showed creatinine of 9.47, last creatinine in March 2020 was 1.78. No reports of fever. No other resident would COVID-19 at the nursing home.   Assessment & Plan:   Principal Problem:   Acute renal failure (HCC) Active Problems:   HTN (hypertension)   CKD (chronic kidney disease) stage 3, GFR 30-59 ml/min (HCC)   HLD  (hyperlipidemia)   Elevated troponin   Left hemiparesis (HCC)   Acute encephalopathy   Acute kidney injury (AKI) with acute tubular necrosis (ATN) (HCC)  Acute renal failure with probable underlying chronic kidney disease stage III: - suspect secondary to hypotension and dehydration - Neprhology consulted, following. Appreciate assistance - More acidotic today. Transitioned to bicarb gtt per Nephrology - Cr. Today 3.4. Recheck bmet in AM    - Per Nephrology, possible voiding trial today   - Pt is not interested in HD  Elevated troponins - With significant acute kidney injury.  - Denies any chest pain/EKG is nonischemic. 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  - CT head is normal; continue to monitor. - PT/OT following with recs for SNF at time of discharge  Hypertension - metoprolol held initially    - BP stable currently   Acute metabolic encephalopathy - Suspect due to #1; Will need continuous monitoring and treatment for the cause. - he's A&O x 3, appropriate interaction     - remains stable, seems resolved  SIRS due to noninfectious cause:  - Blood pressures are stable.  - Lactic acid was normal.  - No localizing infection on clinical examination.  - Patient is a stabilizing.  - He was given loading dose of broad-spectrum antibiotics in the emergency room. Given severe renal failure and alternative explanation of the disease, will hold further antibiotics.     - Afebrile, non-toxic appearing - Presently stable  History of  PE:  - Remains on Eliquis, continues to tolerate thus far  Hx of midline abdominal wound - WOCN; continue current care - Stable currently  Hypokalemia - normalized     - repeat bmet in AM  Mild  hypocalcemia - TUMS w/ meals - stable this AM at 7.7   DVT prophylaxis: Eliquis Code Status: Full Family Communication: Pt in room, family not at bedside Disposition Plan: Uncertain at this time  Consultants:   Nephrology  Procedures:     Antimicrobials: Anti-infectives (From admission, onward)   Start     Dose/Rate Route Frequency Ordered Stop   11/11/18 1430  ceFEPIme (MAXIPIME) 2 g in sodium chloride 0.9 % 100 mL IVPB     2 g 200 mL/hr over 30 Minutes Intravenous STAT 11/11/18 1420 11/11/18 1523   11/11/18 1430  vancomycin (VANCOCIN) 1,500 mg in sodium chloride 0.9 % 500 mL IVPB     1,500 mg 250 mL/hr over 120 Minutes Intravenous STAT 11/11/18 1420 11/11/18 2151   11/11/18 1415  azithromycin (ZITHROMAX) 500 mg in sodium chloride 0.9 % 250 mL IVPB     500 mg 250 mL/hr over 60 Minutes Intravenous  Once 11/11/18 1414 11/12/18 0036      Subjective: Without complaints  Objective: Vitals:   11/21/18 1447 11/21/18 2059 11/22/18 0508 11/22/18 1545  BP: (!) 119/49 129/67 137/65 (!) 117/52  Pulse: 63 66 67 64  Resp: 19 18 18 16   Temp: 97.7 F (36.5 C) 98.1 F (36.7 C) 98.3 F (36.8 C) (!) 97.5 F (36.4 C)  TempSrc: Oral Oral  Axillary  SpO2: 96% 98% 97% 97%  Weight:      Height:        Intake/Output Summary (Last 24 hours) at 11/22/2018 1614 Last data filed at 11/22/2018 1413 Gross per 24 hour  Intake 836 ml  Output 3325 ml  Net -2489 ml   Filed Weights   11/18/18 0604 11/20/18 0615 11/21/18 0500  Weight: 84.4 kg 87.6 kg 86.9 kg    Examination: General exam: Conversant, in no acute distress Respiratory system: normal chest rise, clear, no audible wheezing Cardiovascular system: regular rhythm, s1-s2 Gastrointestinal system: Nondistended, nontender, pos BS, ostomy in place Central nervous system: No seizures, no tremors Extremities: No cyanosis, no joint deformities Skin: No rashes, no pallor Psychiatry: Affect normal // no auditory hallucinations    Data Reviewed: I have personally reviewed following labs and imaging studies  CBC: Recent Labs  Lab 11/16/18 0417 11/18/18 0444 11/19/18 0447 11/21/18 0549 11/22/18 0553  WBC 8.6 8.8 8.6 8.6 7.4  NEUTROABS 6.9 6.9  --   --   --   HGB 10.6* 9.1* 8.2* 7.8* 7.6*  HCT 35.1* 29.9* 27.2* 25.2* 25.5*  MCV 92.1 91.4 91.9 91.6 93.8  PLT 196 190 180 182 563   Basic Metabolic Panel: Recent Labs  Lab 11/16/18 0417  11/18/18 0444 11/19/18 0447 11/20/18 0425 11/21/18 0549 11/22/18 0553  NA 140   < > 139 138 139 139 140  K 4.0   < > 4.7 4.4 4.5 5.1 4.5  CL 108   < > 115* 113* 110 111 115*  CO2 18*   < > 15* 17* 23 21* 15*  GLUCOSE 103*   < > 81 97 153* 85 108*  BUN 66*   < > 53* 47* 43* 44* 43*  CREATININE 6.54*   < > 5.07* 4.37* 4.21* 3.70* 3.45*  CALCIUM 7.7*   < > 8.0* 7.9* 7.8* 8.0* 7.7*  MG  1.8  --  1.8  --   --   --   --   PHOS  --   --  5.3* 4.9* 5.4* 3.5 3.9   < > = values in this interval not displayed.   GFR: Estimated Creatinine Clearance: 21.6 mL/min (A) (by C-G formula based on SCr of 3.45 mg/dL (H)). Liver Function Tests: Recent Labs  Lab 11/18/18 0444 11/19/18 0447 11/20/18 0425 11/21/18 0549 11/22/18 0553  ALBUMIN 2.6* 2.5* 2.4* 2.4* 2.3*   No results for input(s): LIPASE, AMYLASE in the last 168 hours. No results for input(s): AMMONIA in the last 168 hours. Coagulation Profile: No results for input(s): INR, PROTIME in the last 168 hours. Cardiac Enzymes: No results for input(s): CKTOTAL, CKMB, CKMBINDEX, TROPONINI in the last 168 hours. BNP (last 3 results) No results for input(s): PROBNP in the last 8760 hours. HbA1C: No results for input(s): HGBA1C in the last 72 hours. CBG: No results for input(s): GLUCAP in the last 168 hours. Lipid Profile: No results for input(s): CHOL, HDL, LDLCALC, TRIG, CHOLHDL, LDLDIRECT in the last 72 hours. Thyroid Function Tests: No results for input(s): TSH, T4TOTAL, FREET4, T3FREE, THYROIDAB in the last 72  hours. Anemia Panel: No results for input(s): VITAMINB12, FOLATE, FERRITIN, TIBC, IRON, RETICCTPCT in the last 72 hours. Sepsis Labs: No results for input(s): PROCALCITON, LATICACIDVEN in the last 168 hours.  No results found for this or any previous visit (from the past 240 hour(s)).   Radiology Studies: No results found.  Scheduled Meds: . apixaban  2.5 mg Oral BID  . aspirin  81 mg Oral Daily  . atorvastatin  80 mg Oral q1800  . calcium carbonate  1 tablet Oral TID WC  . cholestyramine light  4 g Oral Daily  . feeding supplement  1 Container Oral QHS  . feeding supplement (PRO-STAT SUGAR FREE 64)  30 mL Oral TID BM  . ferrous sulfate  325 mg Oral Q breakfast  . folic acid  0.5 mg Oral Daily  . gabapentin  100 mg Oral Daily  . mouth rinse  15 mL Mouth Rinse BID  . mirtazapine  7.5 mg Oral QHS  . multivitamin with minerals  1 tablet Oral Daily  . pantoprazole  40 mg Oral Daily  . sertraline  100 mg Oral Daily  . tamsulosin  0.4 mg Oral Daily   Continuous Infusions: . calcium gluconate    .  sodium bicarbonate  infusion 1000 mL       LOS: 11 days   Marylu Lund, MD [facility] Pager On Amion  If 7PM-7AM, please contact night-coverage 11/22/2018, 4:14 PM

## 2018-11-22 NOTE — Progress Notes (Signed)
[facility] Progress Note  Subjective:  He has been on normal saline at 100 ml/hr.  He had had 1.5 liters UOP over 8/1.  He had 2.5 liters stool charted over 8/1 as well.  We again discussed declining hemoglobin and the risks, benefits, and indications for transfusion.  He has no acute need for blood today but he would consent to blood transfusion if needed.  Review of systems:   Denies shortness of breath or chest pain Denies nausea or vomiting    Vitals:   11/21/18 0611 11/21/18 1447 11/21/18 2059 11/22/18 0508  BP: (!) 118/49 (!) 119/49 129/67 137/65  Pulse: 65 63 66 67  Resp: 20 19 18 18   Temp: 98.6 F (37 C) 97.7 F (36.5 C) 98.1 F (36.7 C) 98.3 F (36.8 C)  TempSrc: Oral Oral Oral   SpO2: 92% 96% 98% 97%  Weight:      Height:        Inpatient medications: . apixaban  2.5 mg Oral BID  . aspirin  81 mg Oral Daily  . atorvastatin  80 mg Oral q1800  . calcium carbonate  1 tablet Oral TID WC  . cholestyramine light  4 g Oral Daily  . feeding supplement  1 Container Oral QHS  . feeding supplement (PRO-STAT SUGAR FREE 64)  30 mL Oral TID BM  . ferrous sulfate  325 mg Oral Q breakfast  . folic acid  0.5 mg Oral Daily  . gabapentin  100 mg Oral Daily  . mouth rinse  15 mL Mouth Rinse BID  . mirtazapine  7.5 mg Oral QHS  . multivitamin with minerals  1 tablet Oral Daily  . pantoprazole  40 mg Oral Daily  . sertraline  100 mg Oral Daily  . sodium bicarbonate  1,300 mg Oral BID  . tamsulosin  0.4 mg Oral Daily   . sodium chloride 100 mL/hr at 11/22/18 1228   acetaminophen **OR** acetaminophen, albuterol, diphenoxylate-atropine, ondansetron **OR** ondansetron (ZOFRAN) IV    Exam: Gen chronically ill appearing adult male  Chest clear to auscultation bilaterally and normal work of breathing Heart S1S2 no rub  Abd abdominal wound dressing is intact; RLQ ostomy intact with stool  GU foley cath in place Ext no edema appreciated lower extremities; LUE  edema  Neuro is awake, Ox 3 , L arm paresis    Home meds:  - apixaban 2.5 bid  - atorvastatin 80 qd/ aspirin 81/ cholestyramine 4g qd  - tamsulosin 0.4/ omeprazole 40 qd  - sertraline 100 qd/ mirtazapine 7.5 hs/ gabapentin 100 qd  - O2 2L [place]  - metoprolol 12.5 bid  - prn's/ vitamins/ supplements   CXR > 7/22 IMPRESSION: Left base atelectasis or early infiltrate.  Renal US > 13cm R kidney, no hydro, +^echo.  6.8cm L kidney, cortical thinning and ^^echo, no hydro.   ECHO: LVEF 60%, MV regurg appears to have worsened but images are poor  UA 7.22 > few bact, 21-50 rbc and 6- 10 wbc, prot neg  UNa 14,  UCr 116    Assessment/ Plan: 1. AKI on CKD3 - Secondary to pre-renal insults and hypotension in the setting of solitary functioning kidney.  (one kidney is atrophic and likely not functional).  Pre-renal insults of prolonged heat exposure as well as ostomy in situ.  Cannot rule out obstruction component as has improved s/p foley as well.  Reported baseline creat is 1.4. Peak creat here 9.4 on admission.  Slowly resolving with supportive care  and nonoliguric.  Marginal long-term HD candidate and he has also clarified that he would never want dialysis ("I would want no part in dialysis - that is not a life I want.  I took my neighbor to dialysis").  - Transition back to bicarbonate gtt - On flomax and continue foley catheter - Voiding trial.  Monitor for retention - check PVR    2. Metabolic acidosis - transition back to bicarbonate gtt  3. Hypokalemia - monitor with transition back to bicarb  4. HTN - acceptable, off metoprolol; avoid hypotension  5. Chron abd wound - bled earlier in the week; team is following 6. Anemianomocytic - s/p Feraheme x 1.  May be in part secondary to AKI as well as prior bleeding with abdominal wound. CBC in AM. S/p Aranesp 60 mcg x 1.  He would want blood if he needed it and we discussed anticipated need for transfusion soon.  7. Pyuria - cx's negative   8. H/o CVA w/ left sided weakness 9. Chronic hypoxemic resp failure - on oxygen at SNF at baseline at night 10. Chronic debility / SNF dependence - hx of CVA , not sure baseline mobility    Iron/TIBC/Ferritin/ %Sat    Component Value Date/Time   IRON 58 11/18/2018 1756   TIBC 271 11/18/2018 1756   FERRITIN 125 11/18/2018 1756   IRONPCTSAT 21 11/18/2018 1756   Recent Labs  Lab 11/22/18 0553  NA 140  K 4.5  CL 115*  CO2 15*  GLUCOSE 108*  BUN 43*  CREATININE 3.45*  CALCIUM 7.7*  PHOS 3.9  ALBUMIN 2.3*   No results for input(s): AST, ALT, ALKPHOS, BILITOT, PROT in the last 168 hours. Recent Labs  Lab 11/22/18 0553  WBC 7.4  HGB 7.6*  HCT 25.5*  PLT 158    Luke Kaufman 11/22/2018 2:38 PM

## 2018-11-23 LAB — RENAL FUNCTION PANEL
Albumin: 2.3 g/dL — ABNORMAL LOW (ref 3.5–5.0)
Anion gap: 8 (ref 5–15)
BUN: 42 mg/dL — ABNORMAL HIGH (ref 8–23)
CO2: 17 mmol/L — ABNORMAL LOW (ref 22–32)
Calcium: 7.9 mg/dL — ABNORMAL LOW (ref 8.9–10.3)
Chloride: 115 mmol/L — ABNORMAL HIGH (ref 98–111)
Creatinine, Ser: 3.36 mg/dL — ABNORMAL HIGH (ref 0.61–1.24)
GFR calc Af Amer: 21 mL/min — ABNORMAL LOW (ref >60)
GFR calc non Af Amer: 18 mL/min — ABNORMAL LOW (ref >60)
Glucose, Bld: 90 mg/dL (ref 70–99)
Phosphorus: 4.1 mg/dL (ref 2.5–4.6)
Potassium: 4.4 mmol/L (ref 3.5–5.1)
Sodium: 140 mmol/L (ref 135–145)

## 2018-11-23 LAB — CBC
HCT: 24.5 % — ABNORMAL LOW (ref 39.0–52.0)
Hemoglobin: 7.2 g/dL — ABNORMAL LOW (ref 13.0–17.0)
MCH: 28 pg (ref 26.0–34.0)
MCHC: 29.4 g/dL — ABNORMAL LOW (ref 30.0–36.0)
MCV: 95.3 fL (ref 80.0–100.0)
Platelets: 152 10*3/uL (ref 150–400)
RBC: 2.57 MIL/uL — ABNORMAL LOW (ref 4.22–5.81)
RDW: 14 % (ref 11.5–15.5)
WBC: 6.3 10*3/uL (ref 4.0–10.5)
nRBC: 0 % (ref 0.0–0.2)

## 2018-11-23 LAB — TROPONIN I (HIGH SENSITIVITY): Troponin I (High Sensitivity): 15 ng/L (ref <18)

## 2018-11-23 MED ORDER — SODIUM BICARBONATE 650 MG PO TABS
650.0000 mg | ORAL_TABLET | Freq: Every day | ORAL | Status: DC
  Administered 2018-11-23 – 2018-11-25 (×3): 650 mg via ORAL
  Filled 2018-11-23 (×3): qty 1

## 2018-11-23 NOTE — Progress Notes (Signed)
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 fib, chronic left arm flaccid paralysis and long-term nursing home resident presenting to the emergency room with abnormal lab test, feeling weak for about a week. Patient is poor historian. Reviewed his medical records from previous hospitalization. Also reviewed records from nursing home. Patient himself tells that he stays out a lot so he might have gotten dehydrated. He denies any nausea or vomiting. He says his appetite is good. No loose stool noted in colostomy. Patient states that he is urinating but cannot tell me when was the last time he had any urine. Denies any retention of urine or abdominal pain. He does have chronic wound from abdominal wall, currently under wound care. Patient says he does not smoke, however nursing home reported that he goes out in the sun to smoke and he stays outside long time and it was very hot outside. Today morning, he was noted to be more and more weaker. Usually he would transfer from bed to chair and walk with support and now he can barely walk or barely stand. He also seem to be confused than normal. So they did a lab test today where his BMP showed creatinine of 9.47, last creatinine in March 2020 was 1.78. No reports of fever. No other resident would COVID-19 at the nursing home.   Assessment & Plan:   Principal Problem:   Acute renal failure (HCC) Active Problems:   HTN (hypertension)   CKD (chronic kidney disease) stage 3, GFR 30-59 ml/min (HCC)   HLD  (hyperlipidemia)   Elevated troponin   Left hemiparesis (HCC)   Acute encephalopathy   Acute kidney injury (AKI) with acute tubular necrosis (ATN) (HCC)  Acute renal failure with probable underlying chronic kidney disease stage III: - suspect secondary to hypotension and dehydration - Neprhology consulted, following. Appreciate assistance - More acidotic today. Transitioned to bicarb gtt per Nephrology - Cr. Today 3.36. Repeat bmet in AM    - Nephrology had been following, since signed off as of 8/3  Elevated troponins - With significant acute kidney injury.  - Denies any chest pain/EKG is nonischemic. - echo: EF 60-65% w/ normal LVF - continue ASA, statin and metoprolol - Metoprolol remains currently on hold at this time     - Trop peak at 2,015. 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  - CT head is normal; continue to monitor. - PT/OT following with recs for SNF at time of discharge. SW following. COVID testing ordered, pending  Hypertension - metoprolol held initially    - BP stable and controlled at this time  Acute metabolic encephalopathy - Suspect due to #1; Will need continuous monitoring and treatment for the cause. - he's A&O x 3, appropriate interaction     - pt remains stable, seems resolved  SIRS due to noninfectious cause:  - Blood pressures are stable.  - Lactic acid was normal.  - No localizing infection on clinical examination.  - Patient is a stabilizing.  - He was given loading dose of broad-spectrum antibiotics in the emergency room. Given severe renal failure and alternative explanation  of the disease, will hold further antibiotics.     - Afebrile, non-toxic appearing - Presently stable  History of PE:  - Pt had been continued on Eliquis, continues to tolerate thus far  Hx of midline abdominal  wound - WOCN; continue current care - Presently stable currently  Hypokalemia - normalized     - recheck bmet in AM  Mild hypocalcemia - TUMS w/ meals - Ca currently 7.9   DVT prophylaxis: Eliquis Code Status: Full Family Communication: Pt in room, family not at bedside Disposition Plan: Uncertain at this time  Consultants:   Nephrology  Procedures:     Antimicrobials: Anti-infectives (From admission, onward)   Start     Dose/Rate Route Frequency Ordered Stop   11/11/18 1430  ceFEPIme (MAXIPIME) 2 g in sodium chloride 0.9 % 100 mL IVPB     2 g 200 mL/hr over 30 Minutes Intravenous STAT 11/11/18 1420 11/11/18 1523   11/11/18 1430  vancomycin (VANCOCIN) 1,500 mg in sodium chloride 0.9 % 500 mL IVPB     1,500 mg 250 mL/hr over 120 Minutes Intravenous STAT 11/11/18 1420 11/11/18 2151   11/11/18 1415  azithromycin (ZITHROMAX) 500 mg in sodium chloride 0.9 % 250 mL IVPB     500 mg 250 mL/hr over 60 Minutes Intravenous  Once 11/11/18 1414 11/12/18 0036      Subjective: No complaints at this time. Eager to return to SNF  Objective: Vitals:   11/22/18 1545 11/22/18 2044 11/23/18 0500 11/23/18 1319  BP: (!) 117/52 (!) 113/56 111/64 (!) 124/58  Pulse: 64 65  67  Resp: 16 18 18 20   Temp: (!) 97.5 F (36.4 C) 97.6 F (36.4 C) 97.8 F (36.6 C) 97.7 F (36.5 C)  TempSrc: Axillary Axillary Axillary Oral  SpO2: 97% 98% 97% 95%  Weight:      Height:        Intake/Output Summary (Last 24 hours) at 11/23/2018 1445 Last data filed at 11/23/2018 1012 Gross per 24 hour  Intake 369.71 ml  Output 1595 ml  Net -1225.29 ml   Filed Weights   11/18/18 0604 11/20/18 0615 11/21/18 0500  Weight: 84.4 kg 87.6 kg 86.9 kg    Examination: General exam: Awake, laying in bed, in nad Respiratory system: Normal respiratory effort, no wheezing Cardiovascular system: regular rate, s1, s2 Gastrointestinal system: Soft, nondistended, positive BS Central nervous  system: CN2-12 grossly intact, strength intact Extremities: Perfused, no clubbing Skin: Normal skin turgor, no notable skin lesions seen Psychiatry: Mood normal // no visual hallucinations   Data Reviewed: I have personally reviewed following labs and imaging studies  CBC: Recent Labs  Lab 11/18/18 0444 11/19/18 0447 11/21/18 0549 11/22/18 0553 11/23/18 0423  WBC 8.8 8.6 8.6 7.4 6.3  NEUTROABS 6.9  --   --   --   --   HGB 9.1* 8.2* 7.8* 7.6* 7.2*  HCT 29.9* 27.2* 25.2* 25.5* 24.5*  MCV 91.4 91.9 91.6 93.8 95.3  PLT 190 180 182 158 081   Basic Metabolic Panel: Recent Labs  Lab 11/18/18 0444 11/19/18 0447 11/20/18 0425 11/21/18 0549 11/22/18 0553 11/23/18 0423  NA 139 138 139 139 140 140  K 4.7 4.4 4.5 5.1 4.5 4.4  CL 115* 113* 110 111 115* 115*  CO2 15* 17* 23 21* 15* 17*  GLUCOSE 81 97 153* 85 108* 90  BUN 53* 47* 43* 44* 43* 42*  CREATININE 5.07* 4.37* 4.21* 3.70* 3.45* 3.36*  CALCIUM 8.0* 7.9* 7.8* 8.0* 7.7* 7.9*  MG 1.8  --   --   --   --   --   PHOS 5.3* 4.9* 5.4* 3.5 3.9 4.1   GFR: Estimated Creatinine Clearance: 22.1 mL/min (A) (by C-G formula based on SCr of 3.36 mg/dL (H)). Liver Function Tests: Recent Labs  Lab 11/19/18 0447 11/20/18 0425 11/21/18 0549 11/22/18 0553 11/23/18 0423  ALBUMIN 2.5* 2.4* 2.4* 2.3* 2.3*   No results for input(s): LIPASE, AMYLASE in the last 168 hours. No results for input(s): AMMONIA in the last 168 hours. Coagulation Profile: No results for input(s): INR, PROTIME in the last 168 hours. Cardiac Enzymes: No results for input(s): CKTOTAL, CKMB, CKMBINDEX, TROPONINI in the last 168 hours. BNP (last 3 results) No results for input(s): PROBNP in the last 8760 hours. HbA1C: No results for input(s): HGBA1C in the last 72 hours. CBG: No results for input(s): GLUCAP in the last 168 hours. Lipid Profile: No results for input(s): CHOL, HDL, LDLCALC, TRIG, CHOLHDL, LDLDIRECT in the last 72 hours. Thyroid Function Tests: No  results for input(s): TSH, T4TOTAL, FREET4, T3FREE, THYROIDAB in the last 72 hours. Anemia Panel: No results for input(s): VITAMINB12, FOLATE, FERRITIN, TIBC, IRON, RETICCTPCT in the last 72 hours. Sepsis Labs: No results for input(s): PROCALCITON, LATICACIDVEN in the last 168 hours.  No results found for this or any previous visit (from the past 240 hour(s)).   Radiology Studies: No results found.  Scheduled Meds: . apixaban  2.5 mg Oral BID  . aspirin  81 mg Oral Daily  . atorvastatin  80 mg Oral q1800  . calcium carbonate  1 tablet Oral TID WC  . cholestyramine light  4 g Oral Daily  . feeding supplement  1 Container Oral QHS  . feeding supplement (PRO-STAT SUGAR FREE 64)  30 mL Oral TID BM  . ferrous sulfate  325 mg Oral Q breakfast  . folic acid  0.5 mg Oral Daily  . gabapentin  100 mg Oral Daily  . mouth rinse  15 mL Mouth Rinse BID  . mirtazapine  7.5 mg Oral QHS  . multivitamin with minerals  1 tablet Oral Daily  . pantoprazole  40 mg Oral Daily  . sertraline  100 mg Oral Daily  . sodium bicarbonate  650 mg Oral Daily  . tamsulosin  0.4 mg Oral Daily   Continuous Infusions: . sodium chloride 250 mL (11/22/18 1629)     LOS: 12 days   Marylu Lund, MD [facility] Pager On Amion  If 7PM-7AM, please contact night-coverage 11/23/2018, 2:44 PM

## 2018-11-23 NOTE — Progress Notes (Addendum)
[facility] Progress Note  Subjective:  Pt is in good spirits, no c/o today. States his ostomy outpt has "always been high", he doesn't remember what abdominal insult he had that caused his colostomy/.    Vitals:   11/22/18 0508 11/22/18 1545 11/22/18 2044 11/23/18 0500  BP: 137/65 (!) 117/52 (!) 113/56 111/64  Pulse: 67 64 65   Resp: 18 16 18 18   Temp: 98.3 F (36.8 C) (!) 97.5 F (36.4 C) 97.6 F (36.4 C) 97.8 F (36.6 C)  TempSrc:  Axillary Axillary Axillary  SpO2: 97% 97% 98% 97%  Weight:      Height:        Inpatient medications: . apixaban  2.5 mg Oral BID  . aspirin  81 mg Oral Daily  . atorvastatin  80 mg Oral q1800  . calcium carbonate  1 tablet Oral TID WC  . cholestyramine light  4 g Oral Daily  . feeding supplement  1 Container Oral QHS  . feeding supplement (PRO-STAT SUGAR FREE 64)  30 mL Oral TID BM  . ferrous sulfate  325 mg Oral Q breakfast  . folic acid  0.5 mg Oral Daily  . gabapentin  100 mg Oral Daily  . mouth rinse  15 mL Mouth Rinse BID  . mirtazapine  7.5 mg Oral QHS  . multivitamin with minerals  1 tablet Oral Daily  . pantoprazole  40 mg Oral Daily  . sertraline  100 mg Oral Daily  . tamsulosin  0.4 mg Oral Daily   . sodium chloride 250 mL (11/22/18 1629)  .  sodium bicarbonate  infusion 1000 mL 100 mL/hr at 11/22/18 1856   sodium chloride, acetaminophen **OR** acetaminophen, albuterol, diphenoxylate-atropine, ondansetron **OR** ondansetron (ZOFRAN) IV    Exam: Gen chronically ill appearing adult male  Chest clear to auscultation bilaterally and normal work of breathing Heart S1S2 no rub  Abd abdominal wound dressing is intact; RLQ ostomy intact with stool  GU foley cath in place Ext no edema appreciated lower extremities; LUE edema  Neuro is awake, Ox 3 , L arm paresis    Home meds:  - apixaban 2.5 bid  - atorvastatin 80 qd/ aspirin 81/ cholestyramine 4g qd  - tamsulosin 0.4/ omeprazole 40 qd  - sertraline 100  qd/ mirtazapine 7.5 hs/ gabapentin 100 qd  - O2 2L [place]  - metoprolol 12.5 bid  - prn's/ vitamins/ supplements   CXR > 7/22 IMPRESSION: Left base atelectasis or early infiltrate.  Renal US > 13cm R kidney, no hydro, +^echo.  6.8cm L kidney, cortical thinning and ^^echo, no hydro.   ECHO: LVEF 60%, MV regurg appears to have worsened but images are poor  UA 7.22 > few bact, 21-50 rbc and 6- 10 wbc, prot neg  UNa 14,  UCr 116    Assessment/ Plan: 1. AKI on CKD3 - Secondary to pre-renal insults and hypotension in the setting of solitary functioning kidney.  (one kidney is atrophic and likely not functional).  Pre-renal insults of prolonged heat exposure as well as ostomy in situ.  Cannot rule out obstruction component as has improved s/p foley as well.  Reported baseline creat is 1.4. Peak creat here 9.4 on admission.  Pt has said several times he is not interested in dialysis.  - creat improved from 9 >>> 3's here, should cont to improve from here but may take weeks to level off completely. He may not get back to his prior baseline kidney function. Have explained this  to patient and questions answered - given his CKD w/ chronic GI losses he should never take any ACEi/ ARB/ nsaid's  - in my opinion pt can be dc'd as far as his renal function goes and follow w/ his PCP from here, we will sign off - he has significant daily stool / ostomy losses usual 1- 3 L but pt states this is not a new problem. He was on medication to slow losses (Questran) at home. He has no problems drinking. Will need to be able to have access to fluids at all times to keep up with this in the OP setting   2. Metabolic acidosis - take po bicarb 650 qd 3. HTN - acceptable, off metoprolol; avoid low BP's 4. Chron abd wound 5. Anemianomocytic - s/p Feraheme x 1.  Transfuse prn 6. Pyuria - cx's negative  7. H/o CVA w/ left sided weakness 8. Chronic hypoxemic resp failure - on oxygen at SNF at baseline at night 9. 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 Recent Labs  Lab 11/23/18 0423  WBC 6.3  HGB 7.2*  HCT 24.5*  PLT 152    Sol Blazing 11/23/2018 10:10 AM

## 2018-11-24 LAB — NOVEL CORONAVIRUS, NAA (HOSP ORDER, SEND-OUT TO REF LAB; TAT 18-24 HRS): SARS-CoV-2, NAA: NOT DETECTED

## 2018-11-24 LAB — RENAL FUNCTION PANEL
Albumin: 2.5 g/dL — ABNORMAL LOW (ref 3.5–5.0)
Anion gap: 7 (ref 5–15)
BUN: 46 mg/dL — ABNORMAL HIGH (ref 8–23)
CO2: 17 mmol/L — ABNORMAL LOW (ref 22–32)
Calcium: 8.2 mg/dL — ABNORMAL LOW (ref 8.9–10.3)
Chloride: 116 mmol/L — ABNORMAL HIGH (ref 98–111)
Creatinine, Ser: 3.39 mg/dL — ABNORMAL HIGH (ref 0.61–1.24)
GFR calc Af Amer: 20 mL/min — ABNORMAL LOW (ref >60)
GFR calc non Af Amer: 18 mL/min — ABNORMAL LOW (ref >60)
Glucose, Bld: 87 mg/dL (ref 70–99)
Phosphorus: 4.3 mg/dL (ref 2.5–4.6)
Potassium: 4.7 mmol/L (ref 3.5–5.1)
Sodium: 140 mmol/L (ref 135–145)

## 2018-11-24 MED ORDER — CALCIUM CARBONATE ANTACID 500 MG PO CHEW: 1.0000 | CHEWABLE_TABLET | Freq: Three times a day (TID) | ORAL | 0 refills | Status: DC

## 2018-11-24 MED ORDER — SODIUM BICARBONATE 650 MG PO TABS: 650.0000 mg | ORAL_TABLET | Freq: Every day | ORAL | 0 refills | Status: AC

## 2018-11-24 MED ORDER — CALCIUM CARBONATE ANTACID 500 MG PO CHEW: 1.0000 | CHEWABLE_TABLET | Freq: Three times a day (TID) | ORAL | 0 refills | Status: AC

## 2018-11-24 NOTE — Discharge Summary (Signed)
Physician Discharge Summary  Erskin Zinda ZOX:[id number] DOB: 05-Aug-1950 DOA: 11/11/2018  PCP: Rosaria Ferries, MD  Admit date: 11/11/2018 Discharge date: 11/24/2018  Admitted From: SNF Disposition:  SNF  Recommendations for Outpatient Follow-up:  1. Follow up with PCP in 1-2 weeks 2. Recommend repeat BMET in 1 week 3. Recommend never taking any ACEI/ARB and avoid NSAIDs 4. 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 fib, chronic left arm flaccid paralysis and long-term nursing home resident presenting to the emergency room with abnormal lab test, feeling weak for about a week. Patient is poor historian. Reviewed his medical records from previous hospitalization. Also reviewed records from nursing home. Patient himself tells that he stays out a lot so he might have gotten dehydrated. He denies any nausea or vomiting. He says his appetite is good. No loose stool noted in colostomy. Patient states that he is urinating but cannot tell me when was the last time he had any urine. Denies any retention of urine or abdominal pain. He does have chronic wound from abdominal wall, currently under wound care. Patient says he does not smoke, however nursing home reported that he goes out in the sun to smoke and he stays outside long time and it was very hot outside. Today morning, he was noted to be more and more weaker. Usually he would transfer from bed to chair and walk with support and now he can barely walk or barely stand. He also seem to  be confused than normal. So they did a lab test today where his BMP showed creatinine of 9.47, last creatinine in March 2020 was 1.78. No reports of fever. No other resident would COVID-19 at the nursing home.  Discharge Diagnoses:  Principal Problem:   Acute renal failure (HCC) Active Problems:   HTN (hypertension)   CKD (chronic kidney disease) stage 3, GFR 30-59 ml/min (HCC)   HLD (hyperlipidemia)   Elevated troponin   Left hemiparesis (HCC)   Acute encephalopathy   Acute kidney injury (AKI) with acute tubular necrosis (ATN) (HCC)  Acute renal failure with probable underlying chronic kidney disease stage III: - suspect secondary to hypotension and dehydration - Neprhology consulted, following. Appreciate assistance - On bicarb per Nephrology - Cr. Today 3.39.     - Nephrology had been following, since signed off as of 8/3  Elevated troponins - With significant acute kidney injury.  - Denies any chest pain/EKG is nonischemic. - echo: EF 60-65% w/ normal LVF - continue ASA, statin and metoprolol - Metoprolol remains currently on hold at this time given bradycardia     - Trop peak at 2,015. Repeat troponin is neg at 15 (high sensitivity troponin)     - Patient remains asymptomatic. Given renal insufficiency and not wanting HD, doubt cath candidate.     - Discussed with Cardiology. 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  - CT head is normal; continue to monitor. - PT/OT following with recs for SNF at time of discharge. SW following. COVID testing ordered, pending  Hypertension - metoprolol held given bradycardia    - BP stable and controlled at this time  Acute  metabolic encephalopathy - Suspect due to #1; Will need continuous monitoring and treatment for the cause. - he's A&O x 3, appropriate interaction     - pt remains stable, seems  resolved  SIRS due to noninfectious cause:  - Blood pressures are stable.  - Lactic acid was normal.  - No localizing infection on clinical examination.  - Patient is a stabilizing.  - He was given loading dose of broad-spectrum antibiotics in the emergency room. Given severe renal failure and alternative explanation of the disease, will hold further antibiotics.     - Afebrile, non-toxic appearing - Presently stable  History of PE:  - Pt had been continued on Eliquis, continues to tolerate thus far  Hx of midline abdominal wound - WOCN; continue current care - Presently stable currently  Hypokalemia - normalized     - recheck bmet in AM  Mild hypocalcemia - TUMS w/ meals - Ca currently 8.2   Discharge Instructions   Allergies as of 11/24/2018   No Known Allergies     Medication List    STOP taking these medications   metoprolol tartrate 25 MG tablet Commonly known as: LOPRESSOR     TAKE these medications   acetaminophen 325 MG tablet Commonly known as: TYLENOL Place 2 tablets (650 mg total) into feeding tube every 6 (six) hours. What changed:   how to take this  when to take this  reasons to take this   ascorbic acid 500 MG tablet Commonly known as: VITAMIN C Take 500 mg by mouth daily.   aspirin 81 MG chewable tablet Place 1 tablet (81 mg total) into feeding tube daily. What changed: how to take this   atorvastatin 80 MG tablet Commonly known as: LIPITOR Place 1 tablet (80 mg total) into feeding tube daily at 6 PM. What changed: how to take this   calcium carbonate 500 MG chewable tablet Commonly known as: TUMS - dosed in mg elemental calcium Chew 1 tablet (200 mg of elemental calcium total) by mouth 3 (three) times daily with meals for 14 days.   cholestyramine 4 g packet Commonly known as: QUESTRAN Take 4 g by mouth daily.   diphenoxylate-atropine 2.5-0.025 MG tablet Commonly known as:  LOMOTIL Take 1 tablet by mouth 4 (four) times daily as needed for diarrhea or loose stools.   Eliquis 2.5 MG Tabs tablet Generic drug: apixaban Take 2.5 mg by mouth 2 (two) times daily.   ergocalciferol 1.25 MG (50000 UT) capsule Commonly known as: VITAMIN D2 Take 50,000 Units by mouth once a week. On thursdays   ferrous sulfate 325 (65 FE) MG tablet Take 325 mg by mouth daily with breakfast.   folic acid 154 MCG tablet Commonly known as: FOLVITE Take 400 mcg by mouth daily.   gabapentin 100 MG capsule Commonly known as: NEURONTIN Take 100 mg by mouth daily.   mirtazapine 7.5 MG tablet Commonly known as: REMERON Take 7.5 mg by mouth at bedtime. For appetite stimulation   omeprazole 20 MG capsule Commonly known as: PRILOSEC Take 20 mg by mouth daily.   OXYGEN Place 2 L/min into the nose at bedtime. Nasal cannula   ProAir HFA 108 (90 Base) MCG/ACT inhaler Generic drug: albuterol Inhale 2 puffs into the lungs every 6 (six) hours as needed for wheezing or shortness of breath.   sertraline 100 MG tablet Commonly known as: ZOLOFT Take 100 mg by mouth daily.   sodium bicarbonate 650 MG tablet Take 1 tablet (650 mg total) by  mouth daily. Start taking on: November 25, 2018   sodium hypochlorite 0.125 % Soln Commonly known as: DAKIN'S 1/4 STRENGTH Irrigate with 1 application as directed 2 (two) times daily as needed (wound care).   tamsulosin 0.4 MG Caps capsule Commonly known as: FLOMAX Take 0.4 mg by mouth daily.      Follow-up Information    Rosaria Ferries, MD. Schedule an appointment as soon as possible for a visit.   Contact information: [place] [address] [phone]        Sueanne Margarita, MD .   Specialty: Cardiology Contact information: [phone] [place] [address] [phone]          No Known Allergies  Consultations:  Nephrology  Procedures/Studies: Ct Head Wo Contrast  Result Date:  11/11/2018 CLINICAL DATA:  Altered level of consciousness EXAM: CT HEAD WITHOUT CONTRAST TECHNIQUE: Contiguous axial images were obtained from the base of the skull through the vertex without intravenous contrast. COMPARISON:  MRI 10/09/2017.  CT head 10/01/2017 FINDINGS: Brain: Chronic microvascular disease changes in the deep white matter, stable. No acute intracranial abnormality. Specifically, no hemorrhage, hydrocephalus, mass lesion, acute infarction, or significant intracranial injury. Vascular: No hyperdense vessel or unexpected calcification. Skull: No acute calvarial abnormality. Sinuses/Orbits: Visualized paranasal sinuses and mastoids clear. Orbital soft tissues unremarkable. 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 No acute intracranial abnormality. Electronically Signed   By: Rolm Baptise M.D.   On: 11/11/2018 12:52   US Renal  Result Date: 11/11/2018 CLINICAL DATA:  Acute renal failure EXAM: RENAL / URINARY TRACT ULTRASOUND COMPLETE COMPARISON:  09/26/2017 FINDINGS: Right Kidney: Renal measurements: 13.1 x 6.1 x 6.0 cm. = volume: 253 mL . Echogenicity within normal limits. No mass or hydronephrosis visualized. Left Kidney: Renal measurements: 6.8 x 2.7 x 2.2 cm = volume: 21 mL. Cortical thinning and increased echogenicity is noted when compared with the right kidney. These changes are stable from the prior CT examination. Bladder: Appears normal for degree of bladder distention. IMPRESSION: Left renal atrophy unchanged from prior CT examination. Normal-appearing right kidney. Electronically Signed   By: Inez Catalina M.D.   On: 11/11/2018 16:04   Dg Pelvis Portable  Result Date: 11/11/2018 CLINICAL DATA:  Multiple falls. EXAM: PORTABLE PELVIS 1-2 VIEWS COMPARISON:  None. FINDINGS: There is no evidence of pelvic fracture or diastasis. No pelvic bone lesions are seen. IMPRESSION: Negative. Electronically Signed   By: Marijo Conception M.D.   On:  11/11/2018 13:48   Dg Chest Portable 1 View  Result Date: 11/11/2018 CLINICAL DATA:  Weakness for 3 days. EXAM: PORTABLE CHEST 1 VIEW COMPARISON:  04/27/2018 FINDINGS: Left base opacity, likely atelectasis or early infiltrate. Right lung clear. Heart is normal size. No effusions or acute bony abnormality. IMPRESSION: Left base atelectasis or early infiltrate. Electronically Signed   By: Rolm Baptise M.D.   On: 11/11/2018 12:53    Subjective: Eager to return to rehab  Discharge Exam: Vitals:   11/23/18 2258 11/24/18 0417  BP: (!) 135/55 (!) 141/50  Pulse: 62 (!) 54  Resp: 18 18  Temp: 97.6 F (36.4 C) 97.7 F (36.5 C)  SpO2: 94% 94%   Vitals:   11/23/18 1319 11/23/18 2258 11/24/18 0416 11/24/18 0417  BP: (!) 124/58 (!) 135/55  (!) 141/50  Pulse: 67 62  (!) 54  Resp: 20 18  18   Temp: 97.7 F (36.5 C) 97.6 F (36.4 C)  97.7 F (36.5 C)  TempSrc: Oral Oral  Oral  SpO2: 95% 94%  94%  Weight:   87.7 kg   Height:        General: Pt is alert, awake, not in acute distress Cardiovascular: RRR, S1/S2 +, no rubs, no gallops Respiratory: CTA bilaterally, no wheezing, no rhonchi Abdominal: Soft, NT, ND, bowel sounds + Extremities: no edema, no cyanosis   The results of significant diagnostics from this hospitalization (including imaging, microbiology, ancillary and laboratory) are listed below for reference.     Microbiology: Recent Results (from the past 240 hour(s))  Novel Coronavirus, NAA (hospital order; send-out to ref lab)     Status: None   Collection Time: 11/23/18 10:52 AM   Specimen: Nasopharyngeal Swab; Respiratory  Result Value Ref Range Status   SARS-CoV-2, NAA NOT DETECTED NOT DETECTED Final    Comment: (NOTE) This test was developed and its performance characteristics determined by [employer]. This test has not been FDA cleared or approved. This test has been authorized by FDA under an Emergency Use Authorization (EUA). This test is only authorized  for the duration of time the declaration that circumstances exist justifying the authorization of the emergency use of in vitro diagnostic tests for detection of SARS-CoV-2 virus and/or diagnosis of COVID-19 infection under section 564(b)(1) of the Act, 21 U.S.C. 469GEX-5(M)(8), unless the authorization is terminated or revoked sooner. When diagnostic testing is negative, the possibility of a false negative result should be considered in the context of a patient's recent exposures and the presence of clinical signs and symptoms consistent with COVID-19. An individual without symptoms of COVID-19 and who is not shedding SARS-CoV-2 virus would expect to have a negative (not detected) result in this assay. Performed  At: [facility] [place], [place] [id number] Rush Farmer MD [id number]    [place]  Final    Comment: Performed at [facility] [address], [place], [place] [address]     Labs: BNP (last 3 results) No results for input(s): BNP in the last 8760 hours. Basic Metabolic Panel: Recent Labs  Lab 11/18/18 0444  11/20/18 0425 11/21/18 0549 11/22/18 0553 11/23/18 0423 11/24/18 0429  NA 139   < > 139 139 140 140 140  K 4.7   < > 4.5 5.1 4.5 4.4 4.7  CL 115*   < > 110 111 115* 115* 116*  CO2 15*   < > 23 21* 15* 17* 17*  GLUCOSE 81   < > 153* 85 108* 90 87  BUN 53*   < > 43* 44* 43* 42* 46*  CREATININE 5.07*   < > 4.21* 3.70* 3.45* 3.36* 3.39*  CALCIUM 8.0*   < > 7.8* 8.0* 7.7* 7.9* 8.2*  MG 1.8  --   --   --   --   --   --   PHOS 5.3*   < > 5.4* 3.5 3.9 4.1 4.3   < > = values in this interval not displayed.   Liver Function Tests: Recent Labs  Lab 11/20/18 0425 11/21/18 0549 11/22/18 0553 11/23/18 0423 11/24/18 0429  ALBUMIN 2.4* 2.4* 2.3* 2.3* 2.5*   No results for input(s): LIPASE, AMYLASE in the last 168 hours. No results for input(s): AMMONIA in the last 168 hours. CBC: Recent  Labs  Lab 11/18/18 0444 11/19/18 0447 11/21/18 0549 11/22/18 0553 11/23/18 0423  WBC 8.8 8.6 8.6 7.4 6.3  NEUTROABS 6.9  --   --   --   --  HGB 9.1* 8.2* 7.8* 7.6* 7.2*  HCT 29.9* 27.2* 25.2* 25.5* 24.5*  MCV 91.4 91.9 91.6 93.8 95.3  PLT 190 180 182 158 152   Cardiac Enzymes: No results for input(s): CKTOTAL, CKMB, CKMBINDEX, TROPONINI in the last 168 hours. BNP: Invalid input(s): POCBNP CBG: No results for input(s): GLUCAP in the last 168 hours. D-Dimer No results for input(s): DDIMER in the last 72 hours. Hgb A1c No results for input(s): HGBA1C in the last 72 hours. Lipid Profile No results for input(s): CHOL, HDL, LDLCALC, TRIG, CHOLHDL, LDLDIRECT in the last 72 hours. Thyroid function studies No results for input(s): TSH, T4TOTAL, T3FREE, THYROIDAB in the last 72 hours.  Invalid input(s): FREET3 Anemia work up No results for input(s): VITAMINB12, FOLATE, FERRITIN, TIBC, IRON, RETICCTPCT in the last 72 hours. Urinalysis    Component Value Date/Time   COLORURINE YELLOW 11/11/2018 1651   APPEARANCEUR HAZY (A) 11/11/2018 1651   LABSPEC 1.010 11/11/2018 1651   PHURINE 5.0 11/11/2018 1651   GLUCOSEU NEGATIVE 11/11/2018 1651   HGBUR LARGE (A) 11/11/2018 1651   BILIRUBINUR NEGATIVE 11/11/2018 1651   KETONESUR NEGATIVE 11/11/2018 1651   PROTEINUR NEGATIVE 11/11/2018 1651   UROBILINOGEN 0.2 05/17/2013 0307   NITRITE NEGATIVE 11/11/2018 1651   LEUKOCYTESUR NEGATIVE 11/11/2018 1651   Sepsis Labs Invalid input(s): PROCALCITONIN,  WBC,  LACTICIDVEN Microbiology Recent Results (from the past 240 hour(s))  Novel Coronavirus, NAA (hospital order; send-out to ref lab)     Status: None   Collection Time: 11/23/18 10:52 AM   Specimen: Nasopharyngeal Swab; Respiratory  Result Value Ref Range Status   SARS-CoV-2, NAA NOT DETECTED NOT DETECTED Final    Comment: (NOTE) This test was developed and its performance characteristics determined by [employer]. This test  has not been FDA cleared or approved. This test has been authorized by FDA under an Emergency Use Authorization (EUA). This test is only authorized for the duration of time the declaration that circumstances exist justifying the authorization of the emergency use of in vitro diagnostic tests for detection of SARS-CoV-2 virus and/or diagnosis of COVID-19 infection under section 564(b)(1) of the Act, 21 U.S.C. 179XTA-5(W)(9), unless the authorization is terminated or revoked sooner. When diagnostic testing is negative, the possibility of a false negative result should be considered in the context of a patient's recent exposures and the presence of clinical signs and symptoms consistent with COVID-19. An individual without symptoms of COVID-19 and who is not shedding SARS-CoV-2 virus would expect to have a negative (not detected) result in this assay. Performed  At: [facility] [place], [place] [id number] Rush Farmer MD VZ:[id number]    [place]  Final    Comment: Performed at [facility] [address], [place], [place] [address]   Time spent: 30 min  SIGNED:   Marylu Lund, MD  [facility] 11/24/2018, 11:20 AM  If 7PM-7AM, please contact night-coverage

## 2018-11-24 NOTE — TOC Progression Note (Signed)
Transition of Care [facility]) - Progression Note    Patient Details  Name: Blaize Epple MRN: [id number] Date of Birth: 1951/01/28  [facility] CM/SW Contact  Joaquin Courts, RN Phone Number: 11/24/2018, 12:33 PM  Clinical Narrative:    CM faxed dc summary to [facility]. Received call from [facility] stating patient will now need a new auth which they are starting.    Expected Discharge Plan: Skilled Nursing Facility Barriers to Discharge: Continued Medical Work up  Expected Discharge Plan and Services Expected Discharge Plan: [place]   Discharge Planning Services: CM Consult   Living arrangements for the past 2 months: [place] Expected Discharge Date: 11/24/18               DME Arranged: N/A DME Agency: NA       HH Arranged: NA HH Agency: NA         Social Determinants of Health (SDOH) Interventions    Readmission Risk Interventions Readmission Risk Prevention Plan 11/16/2018  Transportation Screening Complete  PCP or Specialist Appt within 3-5 Days Not Complete  Not Complete comments not ready for d/c, from [place] healthcare SNF  [facility] or [place] Complete  Social Work Consult for [place] Planning/Counseling Complete  Palliative Care Screening Not Applicable  Medication Review [employer]) Complete  Some recent data might be hidden

## 2018-11-24 NOTE — Progress Notes (Signed)
Physical Therapy Treatment Patient Details Name: Luke Kaufman MRN: [id number] DOB: March 13, 1951 Today's Date: 11/24/2018    History of Present Illness 68 y.o. 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 fib, chronic left arm flaccid paralysis, stroke, and long-term nursing home resident and admitted for acute renal failure.    PT Comments    Pt reports feeling better today and able to ambulate 40 feet with min assist!  Pt progressing well and hopeful for d/c soon.  Follow Up Recommendations  SNF     Equipment Recommendations  None recommended by PT    Recommendations for Other Services       Precautions / Restrictions Precautions Precautions: Fall Precaution Comments: L sided residual deficits    Mobility  Bed Mobility Overal bed mobility: Needs Assistance Bed Mobility: Supine to Sit     Supine to sit: Supervision;HOB elevated     General bed mobility comments: utilized elevated HOB and bed rail  Transfers Overall transfer level: Needs assistance Equipment used: None Transfers: Sit to/from Stand Sit to Stand: Min guard;Min assist         General transfer comment: pt able to stand from bed with right hand holding bed rail, assist to control descent  Ambulation/Gait Ambulation/Gait assistance: Min assist Gait Distance (Feet): 40 Feet Assistive device: Rolling walker (2 wheeled) Gait Pattern/deviations: Step-through pattern;Decreased stride length     General Gait Details: pt holding RW with right hand and therapist negotiated left side of RW, pt ambulated distance to tolerance; fatigued quickly but pleased with progress today   Stairs              Wheelchair Mobility    Modified Rankin (Stroke Patients Only)       Balance Overall balance assessment: Needs assistance         Standing balance support: Single extremity supported Standing balance-Leahy Scale: Poor Standing balance comment: pt able to stand with only R UE support at EOB today                            Cognition Arousal/Alertness: Awake/alert Behavior During Therapy: WFL for tasks assessed/performed Overall Cognitive Status: Within Functional Limits for tasks assessed                                        Exercises      General Comments        Pertinent Vitals/Pain Pain Assessment: No/denies pain Pain Intervention(s): Repositioned;Monitored during session    Home Living                      Prior Function            PT Goals (current goals can now be found in the care plan section) Progress towards PT goals: Progressing toward goals    Frequency    Min 2X/week      PT Plan Current plan remains appropriate    Co-evaluation              AM-PAC PT "6 Clicks" Mobility   Outcome Measure  Help needed turning from your back to your side while in a  flat bed without using bedrails?: A Little Help needed moving from lying on your back to sitting on the side of a flat bed without using bedrails?: A Little Help needed moving to and from a bed to a chair (including a wheelchair)?: A Little Help needed standing up from a chair using your arms (e.g., wheelchair or bedside chair)?: A Little Help needed to walk in hospital room?: A Little Help needed climbing 3-5 steps with a railing? : A Lot 6 Click Score: 17    End of Session Equipment Utilized During Treatment: Gait belt Activity Tolerance: Patient limited by fatigue Patient left: with call bell/phone within reach;in chair;with chair alarm set   PT Visit Diagnosis: Other abnormalities of gait and mobility (R26.89)     Time: 0160-1093 PT  Time Calculation (min) (ACUTE ONLY): 18 min  Charges:  $Gait Training: 8-22 mins                    Carmelia Bake, PT, DPT Acute Rehabilitation Services Office: [phone] Pager: [place] E 11/24/2018, 1:59 PM

## 2018-11-24 NOTE — Care Management Important Message (Signed)
Important Message  Patient Details IM Letter given to Nancy Marus RN to present to the Patient Name: Luke Kaufman MRN: [id number] Date of Birth: Sep 02, 1950   Medicare Important Message Given:  Yes     Kerin Salen 11/24/2018, 10:18 AM

## 2018-11-25 DIAGNOSIS — N170 Acute kidney failure with tubular necrosis: Principal | ICD-10-CM

## 2018-11-25 LAB — RENAL FUNCTION PANEL
Albumin: 2.7 g/dL — ABNORMAL LOW (ref 3.5–5.0)
Anion gap: 5 (ref 5–15)
BUN: 46 mg/dL — ABNORMAL HIGH (ref 8–23)
CO2: 17 mmol/L — ABNORMAL LOW (ref 22–32)
Calcium: 8.3 mg/dL — ABNORMAL LOW (ref 8.9–10.3)
Chloride: 117 mmol/L — ABNORMAL HIGH (ref 98–111)
Creatinine, Ser: 3.34 mg/dL — ABNORMAL HIGH (ref 0.61–1.24)
GFR calc Af Amer: 21 mL/min — ABNORMAL LOW (ref >60)
GFR calc non Af Amer: 18 mL/min — ABNORMAL LOW (ref >60)
Glucose, Bld: 76 mg/dL (ref 70–99)
Phosphorus: 4.4 mg/dL (ref 2.5–4.6)
Potassium: 4.6 mmol/L (ref 3.5–5.1)
Sodium: 139 mmol/L (ref 135–145)

## 2018-11-25 NOTE — Progress Notes (Signed)
Pt discharged to [facility]. Report called to [facility], LPN. Pt discharge instructions reviewed acknowledge understanding. SRP, RN

## 2018-11-25 NOTE — Discharge Summary (Addendum)
Discharge Summary  Luke Kaufman ZOX:[id number] DOB: [birth year]  PCP: Rosaria Ferries, MD  Admit date: 11/11/2018 Discharge date: 11/25/2018  Time spent: 35 minutes  Recommendations for Outpatient Follow-up:  1. Follow up with PCP in 1-2 weeks 2. Recommend repeat BMET in 1 week 3. Recommend never taking any ACEI/ARB and avoid NSAIDs 1. Follow up with Cardiology as scheduled   Discharge Diagnoses:  Active Hospital Problems   Diagnosis Date Noted  . Acute renal failure ([place]) 10/14/2017  . Acute kidney injury (AKI) with acute tubular necrosis (ATN) ([place]) 11/11/2018  . Left hemiparesis ([place])   . Acute encephalopathy   . Elevated troponin   . HLD (hyperlipidemia) 04/10/2015  . CKD (chronic kidney disease) stage 3, GFR 30-59 ml/min (HCC) 08/09/2014  . HTN (hypertension) 05/16/2013    Resolved Hospital Problems  No resolved problems to display.    Discharge Condition: Improved.  Diet recommendation: Resume previous diet.  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 fib, chronic left arm flaccid paralysis and long-term nursing home resident presenting to the emergency room with abnormal lab test, feeling weak for about a week. Patient is poor historian. Reviewed his medical records from previous hospitalization. Also reviewed records from nursing home. Patient himself tells that he stays out a lot so he might have gotten dehydrated. He denies any nausea or vomiting. He says his appetite  is good. No loose stool noted in colostomy. Patient states that he is urinating but cannot tell me when was the last time he had any urine. Denies any retention of urine or abdominal pain. He does have chronic wound from abdominal wall, currently under wound care. Patient says he does not smoke, however nursing home reported that he goes out in the sun to smoke and he stays outside long time and it was very hot outside. Today morning, he was noted to be more and more weaker. Usually he would transfer from bed to chair and walk with support and now he can barely walk or barely stand. He also seem to be confused than normal. So they did a lab test today where his BMP showed creatinine of 9.47, last creatinine in March 2020 was 1.78. No reports of fever. No other resident would COVID-19 at the nursing home.  11/25/18: Discharge delayed due to insurance authorization.  Patient was seen and examined at his bedside this morning.  He had no new complaints.  Vital signs reviewed and are stable.  Patient is medically stable for discharge to SNF.   Hospital Course:  Principal Problem:   Acute renal failure (HCC) Active Problems:   HTN (hypertension)   CKD (chronic kidney disease) stage 3, GFR 30-59 ml/min (HCC)   HLD (hyperlipidemia)   Elevated troponin   Left hemiparesis (HCC)   Acute encephalopathy   Acute kidney injury (AKI) with acute tubular necrosis (ATN) (HCC)  Acute renal failure with probable underlying chronic kidney disease stage III: - suspect secondary to hypotension and dehydration - Neprhology consulted, following. Appreciate assistance - On bicarb per Nephrology - Cr. 3.39  On 11/24/18 >> 3.34 on 11/25/18. - Nephrology had been following, since signed off as of 8/3  Elevated troponins - With significant acute kidney injury.  - Denies any chest pain/EKG is nonischemic. - echo: EF 60-65% w/ normal LVF - continue ASA, statin and  metoprolol - Metoprolol remainscurrently on hold at this time given bradycardia - Trop peak at 2,015. Repeat troponin is neg at 15 (high sensitivity troponin)     - Patient remains asymptomatic. Given renal insufficiency and not wanting HD, doubt cath candidate.     - Discussed with Cardiology. To follow up as outpatient as scheduled  Intermittent bradycardia Continue to hold off beta-blocker. He was on Lopressor 25 mg twice daily at home. 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  - CT head is normal; continue to monitor. - PT/OT following with recs for SNF at time of discharge. SW following. COVID testing ordered, pending  Hypertension - metoprolol held given bradycardia - BP stable and controlled at this time  Acute metabolic encephalopathy - Suspect due to #1; Will need continuous monitoring and treatment for the cause. - he's A&O x 3, appropriate interaction - pt remainsstable, seems resolved  SIRS due to noninfectious cause:  - Blood pressures are stable.  - Lactic acid was normal.  - No localizing infection on clinical examination.  - Patient is a stabilizing.  - He was given loading dose of broad-spectrum antibiotics in the emergency room. Given severe renal failure and alternative explanation of the disease, will hold further antibiotics. - Afebrile, non-toxic appearing - Presently stable  History of PE:  -Pt had been continuedon Eliquis, continues to tolerate thus far  Hx of midline abdominal wound - WOCN; continue current care -Presently stablecurrently  Hypokalemia, resolved post repletion - normalized - recheckbmet in AM  Mild hypocalcemia, resolved -Continue TUMS w/ meals -Ca currently 8.2, albumin 2.7 >> corrected calcium  9.2   Discharge Instructions   Allergies as of 11/24/2018   No Known Allergies  Consultations:  Nephrology    Discharge Exam: BP 136/62 (BP Location: Right Arm)   Pulse 63   Temp 98.4 F (36.9 C) (Oral)   Resp 18   Ht 5\' 7"  (1.702 m)   Wt 87.7 kg   SpO2 98%   BMI 30.29 kg/m  . General: 68 y.o. year-old male well developed well nourished in no acute distress.  Alert and oriented x3. . Cardiovascular: Regular rate and rhythm with no rubs or gallops.  No thyromegaly or JVD noted.   [name] Respiratory: Clear to auscultation with no wheezes or rales. Good inspiratory effort. . Abdomen: Soft nontender nondistended with normal bowel sounds x4 quadrants. . Musculoskeletal: No lower extremity edema. 2/4 pulses in all 4 extremities. . Skin: No ulcerative lesions noted or rashes, . Psychiatry: Mood is appropriate for condition and setting  Discharge Instructions You were cared for by a hospitalist during your hospital stay. If you have any questions about your discharge medications or the care you received while you were in the hospital after you are discharged, you can call the unit and asked to speak with the hospitalist on call if the hospitalist that took care of you is not available. Once you are discharged, your primary care physician will handle any further medical issues. Please note that NO REFILLS for any discharge medications will be authorized once you are discharged, as it is imperative that you return  to your primary care physician (or establish a relationship with a primary care physician if you do not have one) for your aftercare needs so that they can reassess your need for medications and monitor your lab values.   Allergies as of 11/25/2018   No Known Allergies     Medication List    STOP taking these medications   metoprolol tartrate 25 MG tablet Commonly known as: LOPRESSOR     TAKE these medications   acetaminophen 325 MG tablet Commonly known as:  TYLENOL Place 2 tablets (650 mg total) into feeding tube every 6 (six) hours. What changed:   how to take this  when to take this  reasons to take this   ascorbic acid 500 MG tablet Commonly known as: VITAMIN C Take 500 mg by mouth daily.   aspirin 81 MG chewable tablet Place 1 tablet (81 mg total) into feeding tube daily. What changed: how to take this   atorvastatin 80 MG tablet Commonly known as: LIPITOR Place 1 tablet (80 mg total) into feeding tube daily at 6 PM. What changed: how to take this   calcium carbonate 500 MG chewable tablet Commonly known as: TUMS - dosed in mg elemental calcium Chew 1 tablet (200 mg of elemental calcium total) by mouth 3 (three) times daily with meals for 14 days.   cholestyramine 4 g packet Commonly known as: QUESTRAN Take 4 g by mouth daily.   diphenoxylate-atropine 2.5-0.025 MG tablet Commonly known as: LOMOTIL Take 1 tablet by mouth 4 (four) times daily as needed for diarrhea or loose stools.   Eliquis 2.5 MG Tabs tablet Generic drug: apixaban Take 2.5 mg by mouth 2 (two) times daily.   ergocalciferol 1.25 MG (50000 UT) capsule Commonly known as: VITAMIN D2 Take 50,000 Units by mouth once a week. On thursdays   ferrous sulfate 325 (65 FE) MG tablet Take 325 mg by mouth daily with breakfast.   folic acid 932 MCG tablet Commonly known as: FOLVITE Take 400 mcg by mouth daily.   gabapentin 100 MG capsule Commonly known as: NEURONTIN Take 100 mg by mouth daily.   mirtazapine 7.5 MG tablet Commonly known as: REMERON Take 7.5 mg by mouth at bedtime. For appetite stimulation   omeprazole 20 MG capsule Commonly known as: PRILOSEC Take 20 mg by mouth daily.   OXYGEN Place 2 L/min into the nose at bedtime. Nasal cannula   ProAir HFA 108 (90 Base) MCG/ACT inhaler Generic drug: albuterol Inhale 2 puffs into the lungs every 6 (six) hours as needed for wheezing or shortness of breath.   sertraline 100 MG tablet Commonly  known as: ZOLOFT Take 100 mg by mouth daily.   sodium bicarbonate 650 MG tablet Take 1 tablet (650 mg total) by mouth daily.   sodium hypochlorite 0.125 % Soln Commonly known as: DAKIN'S 1/4 STRENGTH Irrigate with 1 application as directed 2 (two) times daily as needed (wound care).   tamsulosin 0.4 MG Caps capsule Commonly known as: FLOMAX Take 0.4 mg by mouth daily.      No Known Allergies Follow-up Information    Rosaria Ferries, MD. Schedule an appointment as soon as possible for a visit.   Contact information: [place] [address] [phone]        Sueanne Margarita, MD .   Specialty: Cardiology Contact information: [phone] [address] [place] [address] [place] [address] [phone]            The results  of significant diagnostics from this hospitalization (including imaging, microbiology, ancillary and laboratory) are listed below for reference.    Significant Diagnostic Studies: Ct Head Wo Contrast  Result Date: 11/11/2018 CLINICAL DATA:  Altered level of consciousness EXAM: CT HEAD WITHOUT CONTRAST TECHNIQUE: Contiguous axial images were obtained from the base of the skull through the vertex without intravenous contrast. COMPARISON:  MRI 10/09/2017.  CT head 10/01/2017 FINDINGS: Brain: Chronic microvascular disease changes in the deep white matter, stable. No acute intracranial abnormality. Specifically, no hemorrhage, hydrocephalus, mass lesion, acute infarction, or significant intracranial injury. Vascular: No hyperdense vessel or unexpected calcification. Skull: No acute calvarial abnormality. Sinuses/Orbits: Visualized paranasal sinuses and mastoids clear. Orbital soft tissues unremarkable. 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 No acute intracranial abnormality. Electronically Signed   By: Rolm Baptise M.D.   On: 11/11/2018 12:52   US Renal  Result Date: 11/11/2018 CLINICAL DATA:  Acute  renal failure EXAM: RENAL / URINARY TRACT ULTRASOUND COMPLETE COMPARISON:  09/26/2017 FINDINGS: Right Kidney: Renal measurements: 13.1 x 6.1 x 6.0 cm. = volume: 253 mL . Echogenicity within normal limits. No mass or hydronephrosis visualized. Left Kidney: Renal measurements: 6.8 x 2.7 x 2.2 cm = volume: 21 mL. Cortical thinning and increased echogenicity is noted when compared with the right kidney. These changes are stable from the prior CT examination. Bladder: Appears normal for degree of bladder distention. IMPRESSION: Left renal atrophy unchanged from prior CT examination. Normal-appearing right kidney. Electronically Signed   By: Inez Catalina M.D.   On: 11/11/2018 16:04   Dg Pelvis Portable  Result Date: 11/11/2018 CLINICAL DATA:  Multiple falls. EXAM: PORTABLE PELVIS 1-2 VIEWS COMPARISON:  None. FINDINGS: There is no evidence of pelvic fracture or diastasis. No pelvic bone lesions are seen. IMPRESSION: Negative. Electronically Signed   By: Marijo Conception M.D.   On: 11/11/2018 13:48   Dg Chest Portable 1 View  Result Date: 11/11/2018 CLINICAL DATA:  Weakness for 3 days. EXAM: PORTABLE CHEST 1 VIEW COMPARISON:  04/27/2018 FINDINGS: Left base opacity, likely atelectasis or early infiltrate. Right lung clear. Heart is normal size. No effusions or acute bony abnormality. IMPRESSION: Left base atelectasis or early infiltrate. Electronically Signed   By: Rolm Baptise M.D.   On: 11/11/2018 12:53    Microbiology: Recent Results (from the past 240 hour(s))  Novel Coronavirus, NAA (hospital order; send-out to ref lab)     Status: None   Collection Time: 11/23/18 10:52 AM   Specimen: Nasopharyngeal Swab; Respiratory  Result Value Ref Range Status   SARS-CoV-2, NAA NOT DETECTED NOT DETECTED Final    Comment: (NOTE) This test was developed and its performance characteristics determined by [employer]. This test has not been FDA cleared or approved. This test has been authorized by FDA under  an Emergency Use Authorization (EUA). This test is only authorized for the duration of time the declaration that circumstances exist justifying the authorization of the emergency use of in vitro diagnostic tests for detection of SARS-CoV-2 virus and/or diagnosis of COVID-19 infection under section 564(b)(1) of the Act, 21 U.S.C. 300PQZ-3(A)(0), unless the authorization is terminated or revoked sooner. When diagnostic testing is negative, the possibility of a false negative result should be considered in the context of a patient's recent exposures and the presence of clinical signs and symptoms consistent with COVID-19. An individual without symptoms of COVID-19 and who is not shedding SARS-CoV-2 virus would expect to have a negative (not detected) result in  this assay. Performed  At: [facility] [place], [place] [id number] Rush Farmer MD WN:[id number]    [place]  Final    Comment: Performed at [facility] [address], [place], [place] [address]     Labs: Basic Metabolic Panel: Recent Labs  Lab 11/21/18 0549 11/22/18 0553 11/23/18 0423 11/24/18 0429 11/25/18 0415  NA 139 140 140 140 139  K 5.1 4.5 4.4 4.7 4.6  CL 111 115* 115* 116* 117*  CO2 21* 15* 17* 17* 17*  GLUCOSE 85 108* 90 87 76  BUN 44* 43* 42* 46* 46*  CREATININE 3.70* 3.45* 3.36* 3.39* 3.34*  CALCIUM 8.0* 7.7* 7.9* 8.2* 8.3*  PHOS 3.5 3.9 4.1 4.3 4.4   Liver Function Tests: Recent Labs  Lab 11/21/18 0549 11/22/18 0553 11/23/18 0423 11/24/18 0429 11/25/18 0415  ALBUMIN 2.4* 2.3* 2.3* 2.5* 2.7*   No results for input(s): LIPASE, AMYLASE in the last 168 hours. No results for input(s): AMMONIA in the last 168 hours. CBC: Recent Labs  Lab 11/19/18 0447 11/21/18 0549 11/22/18 0553 11/23/18 0423  WBC 8.6 8.6 7.4 6.3  HGB 8.2* 7.8* 7.6* 7.2*  HCT 27.2* 25.2* 25.5* 24.5*  MCV 91.9 91.6 93.8 95.3  PLT 180 182 158 152    Cardiac Enzymes: No results for input(s): CKTOTAL, CKMB, CKMBINDEX, TROPONINI in the last 168 hours. BNP: BNP (last 3 results) No results for input(s): BNP in the last 8760 hours.  ProBNP (last 3 results) No results for input(s): PROBNP in the last 8760 hours.  CBG: No results for input(s): GLUCAP in the last 168 hours.     Signed:  Kayleen Memos, MD [facility] 11/25/2018, 11:25 AM

## 2018-11-25 NOTE — Progress Notes (Signed)
Transportation was called with [facility]. [facility] approved and [facility] is waiting for pt. Room 116, call report to [phone].

## 2018-12-09 NOTE — Progress Notes (Signed)
Cardiology Office Note:    Date:  12/10/2018   ID:  Luke Kaufman, DOB 1950-11-25, MRN [id number]  PCP:  Rosaria Ferries, MD  Cardiologist:  Fransico Him, MD  Referring MD: Satira Sark [facility]*   Chief Complaint  Patient presents with   Hospitalization Follow-up    Post acute renal failure    History of Present Illness:    Luke Kaufman is a 68 y.o. male with a past medical history significant for CAD/MI in 2015 without stents in [facility], status post V. fib arrest in [place]. fib in 2019, PVD, CVA with left-sided weakness, hypertension, hyperlipidemia, tobacco use, chronic hypoxic respiratory failure on oxygen at night, PUD with GI bleeding history of PE now on Eliquis.  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  He had repeated surgeries following that with failed attempts at closure of his belly.  He also had failure to wean off the ventilator and underwent tracheostomy 08/13/2017.  He used to see Dr. Otho Perl in [facility] for cardiology but has not been seen there in at least 4 years per pt.   The patient has a history of V. fib arrest with 11 minutes of CPR and shocked x2 in 09/2017.  He was resuscitated and developed new onset atrial fibrillation following arrest.  CTA showed a PE in the left lower lobe.  He had several DVTs in the upper and lower extremities.  Echocardiogram following arrest showed newly reduced LVEF 40-45% with severe hypokinesis and anteroseptal and anterior myocardium consistent with ischemia or LAD infarction.  Troponins were elevated, peak 1.74.  Cardiac cath was not done in the setting of an open abdominal wound.  The patient was admitted to the hospital 11/11/2018-11/25/2018 with acute renal failure with probable underlying chronic CKD stage III.  His creatinine was 9.44 on presentation.  Nephrology was consulted.  He also had acute metabolic encephalopathy felt to be related to acute renal failure.   Nephrology was consulted and renal ultrasound showed 1 very atrophied kidney.  The AKI was thought to be related to prerenal insult and hypotension in the setting of solitary kidney functioning (1 kidney is atrophic and likely not functional) and prolonged heat exposure as well as presence of ostomy.  He had suspected sepsis but no source of infection was found.  He was treated with broad-spectrum antibiotics, initially, then discontinued with no further support for sepsis.  Creatinine had improved to 3.34 by discharge.  High-sensitivity troponins were initially 1978 and 2015 on day of presentation, 11/11/2018.  Follow-up high-sensitivity troponin was down to 15 on 11/23/2018.  He also had intermittent bradycardia and beta-blocker was held.  Echocardiogram showed EF 60-65%, no LV wall motion abnormalities.   The patient was discharged to [place] center.  Luke Kaufman is here today alone for follow up. He is alert and oriented. In a wheelchair.  He says that he is doing well. He never had any chest pain. He is now concentrating on drinking enough fluids. He had been dehydrated that contributed to his renal function. He had fallen a couple of times due to weakness. He says that once he was rehydrated he could walk again. He has left arm weakness after a stroke but legs are ok. He has a tracheostomy. He has ileostomy and his abdominal wound is still open, being packed twice day. He says that he gets anxiety when he walks at times which leads to shortness of breath  but he does not think shortness of breath related to exertion. He does not get short of breath with physical therapy. He denies orthopnea, PND or edema.   Past Medical History:  Diagnosis Date   Acute metabolic encephalopathy 6/38/7564   Gastric ulcer    Hyperlipemia    Hypertension    Hypospadias    MI (myocardial infarction) [facility])    Pulmonary embolism and infarction ([place]) 10/14/2017   Stroke ([place])    Tobacco  abuse     Past Surgical History:  Procedure Laterality Date   APPLICATION OF WOUND VAC N/A 09/04/2017   Procedure: APPLICATION OF WOUND VAC and Exploration of Abdomen.;  Surgeon: Elam Dutch, MD;  Location: [place];  Service: Vascular;  Laterality: N/A;   APPLICATION OF WOUND VAC N/A 09/06/2017   Procedure: APPLICATION OF WOUND VAC;  Surgeon: Georganna Skeans, MD;  Location: [place];  Service: General;  Laterality: N/A;   APPLICATION OF WOUND VAC N/A 09/08/2017   Procedure: APPLICATION OF WOUND VAC;  Surgeon: Judeth Horn, MD;  Location: [place];  Service: General;  Laterality: N/A;   APPLICATION OF WOUND VAC N/A 09/12/2017   Procedure: APPLICATION OF WOUND VAC  (CHANGE);  Surgeon: Judeth Horn, MD;  Location: [place];  Service: General;  Laterality: N/A;   APPLICATION OF WOUND VAC N/A 09/14/2017   Procedure: APPLICATION OF WOUND VAC;  Surgeon: Greer Pickerel, MD;  Location: [place];  Service: General;  Laterality: N/A;   APPLICATION OF WOUND VAC N/A 09/23/2017   Procedure: APPLICATION OF NEGATIVE PRESSURE THERAPY;  Surgeon: Kieth Brightly Arta Bruce, MD;  Location: [place];  Service: General;  Laterality: N/A;   BYPASS GRAFT AORTA TO AORTA  09/04/2017   Procedure: Aorta to Superior Mesinteric aorta bypass ultrason Left common Femoral/;  Surgeon: Elam Dutch, MD;  Location: [facility] [facility];  Service: Vascular;;   ESOPHAGOGASTRODUODENOSCOPY N/A 08/09/2014   Procedure: ESOPHAGOGASTRODUODENOSCOPY (EGD);  Surgeon: Jerene Bears, MD;  Location: [facility];  Service: Gastroenterology;  Laterality: N/A;   ESOPHAGOGASTRODUODENOSCOPY N/A 08/10/2014   Procedure: ESOPHAGOGASTRODUODENOSCOPY (EGD);  Surgeon: Jerene Bears, MD;  Location: [facility];  Service: Gastroenterology;  Laterality: N/A;   ILEOSTOMY Right 09/06/2017   Procedure: POSSIBLE ILEOSTOMY;  Surgeon: Georganna Skeans, MD;  Location: [place];  Service: General;  Laterality: Right;   INSERTION OF MESH N/A 09/23/2017   Procedure: INSERTION OF MESH;  Surgeon:  Kinsinger, Arta Bruce, MD;  Location: [place];  Service: General;  Laterality: N/A;   IR FLUORO GUIDE CV LINE RIGHT  10/04/2017   IR US GUIDE VASC ACCESS RIGHT  10/04/2017   LAPAROTOMY Right 09/04/2017   Procedure: EXPLORATORY LAPAROTOMY Right Colon Resection;  Surgeon: Elam Dutch, MD;  Location: [place];  Service: Vascular;  Laterality: Right;   LAPAROTOMY N/A 09/06/2017   Procedure: EXPLORATORY LAPAROTOMY;  Surgeon: Georganna Skeans, MD;  Location: [place];  Service: General;  Laterality: N/A;   LAPAROTOMY N/A 09/08/2017   Procedure: EXPLORATORY LAPAROTOMY, ABDOMINAL WASHOUT, PARTIAL CLOSURE OF ABDOMEN;  Surgeon: Judeth Horn, MD;  Location: [place];  Service: General;  Laterality: N/A;   LAPAROTOMY N/A 09/10/2017   Procedure: EXPLORATORY LAPAROTOMY WITH POSSIBLE WOUND CLOSURE ABDOMEN;  Surgeon: Judeth Horn, MD;  Location: [place];  Service: General;  Laterality: N/A;   LAPAROTOMY N/A 09/12/2017   Procedure: EXPLORATORY LAPAROTOMY;  Surgeon: Judeth Horn, MD;  Location: [place];  Service: General;  Laterality: N/A;   LAPAROTOMY N/A 09/14/2017   Procedure: EXPLORATORY LAPAROTOMY;  Surgeon: Greer Pickerel, MD;  Location: [place] [facility];  Service: General;  Laterality: N/A;   LAPAROTOMY N/A 09/17/2017   Procedure: EXPLORATORY LAPAROTOMY AND PARTIAL CLOSURE OF ABDOMEN;  Surgeon: Judeth Horn, MD;  Location: [place];  Service: General;  Laterality: N/A;   LAPAROTOMY N/A 09/19/2017   Procedure: EXPLORATORY LAPAROTOMY & DRESSING CHANGE;  Surgeon: Coralie Keens, MD;  Location: [place];  Service: General;  Laterality: N/A;   LAPAROTOMY N/A 09/23/2017   Procedure: EXPLORATORY LAPAROTOMY, INCISIONAL HERNIA REPAIR WITH MESH INSERTION, PARTIAL CLOSURE OF SKIN;  Surgeon: Kinsinger, Arta Bruce, MD;  Location: [place];  Service: General;  Laterality: N/A;   TRACHEOSTOMY TUBE PLACEMENT N/A 09/12/2017   Procedure: TRACHEOSTOMY;  Surgeon: Judeth Horn, MD;  Location: [place];  Service: General;  Laterality: N/A;   VISCERAL  ANGIOGRAM N/A 09/04/2017   Procedure: Non Selective MESENTERIC ANGIOGRAM,;  Surgeon: Elam Dutch, MD;  Location: [facility];  Service: Vascular;  Laterality: N/A;    Current Medications: Current Meds  Medication Sig   acetaminophen (TYLENOL) 325 MG tablet Place 2 tablets (650 mg total) into feeding tube every 6 (six) hours. (Patient taking differently: Take 650 mg by mouth every 6 (six) hours as needed for mild pain, fever or headache. )   albuterol (PROAIR HFA) 108 (90 Base) MCG/ACT inhaler Inhale 2 puffs into the lungs every 6 (six) hours as needed for wheezing or shortness of breath.   apixaban (ELIQUIS) 2.5 MG TABS tablet Take 2.5 mg by mouth 2 (two) times daily.   ascorbic acid (VITAMIN C) 500 MG tablet Take 500 mg by mouth daily.   aspirin 81 MG chewable tablet Place 1 tablet (81 mg total) into feeding tube daily. (Patient taking differently: Chew 81 mg by mouth daily. )   atorvastatin (LIPITOR) 80 MG tablet Place 1 tablet (80 mg total) into feeding tube daily at 6 PM. (Patient taking differently: Take 80 mg by mouth daily at 6 PM. )   cholestyramine (QUESTRAN) 4 g packet Take 4 g by mouth daily.   diphenoxylate-atropine (LOMOTIL) 2.5-0.025 MG tablet Take 1 tablet by mouth 4 (four) times daily as needed for diarrhea or loose stools.   ergocalciferol (VITAMIN D2) 1.25 MG (50000 UT) capsule Take 50,000 Units by mouth once a week. On thursdays   ferrous sulfate 325 (65 FE) MG tablet Take 325 mg by mouth daily with breakfast.   folic acid (FOLVITE) 270 MCG tablet Take 400 mcg by mouth daily.   gabapentin (NEURONTIN) 100 MG capsule Take 100 mg by mouth daily.    mirtazapine (REMERON) 7.5 MG tablet Take 7.5 mg by mouth at bedtime. For appetite stimulation   omeprazole (PRILOSEC) 20 MG capsule Take 20 mg by mouth daily.   OXYGEN Place 2 L/min into the nose at bedtime. Nasal cannula   sertraline (ZOLOFT) 100 MG tablet Take 100 mg by mouth daily.    sodium bicarbonate 650 MG  tablet Take 1 tablet (650 mg total) by mouth daily.   sodium hypochlorite (DAKIN'S 1/4 STRENGTH) 0.125 % SOLN Irrigate with 1 application as directed 2 (two) times daily as needed (wound care).   tamsulosin (FLOMAX) 0.4 MG CAPS capsule Take 0.4 mg by mouth daily.     Allergies:   Patient has no known allergies.   Social History   Socioeconomic History   Marital status: Single    Spouse name: Not on file   Number of children: 1   Years of education: Not on file   Highest education level: Not on file  Occupational  History   Not on file  Social Needs   Financial resource strain: Not on file   Food insecurity    Worry: Not on file    Inability: Not on file   Transportation needs    Medical: Not on file    Non-medical: Not on file  Tobacco Use   Smoking status: Former Smoker    Packs/day: 2.00    Years: 50.00    Pack years: 100.00    Types: Cigarettes    Quit date: 08/20/2017    Years since quitting: 1.3   Smokeless tobacco: Never Used  Substance and Sexual Activity   Alcohol use: Yes    Alcohol/week: 0.0 standard drinks    Comment: occasional   Drug use: No   Sexual activity: Not on file  Lifestyle   Physical activity    Days per week: Not on file    Minutes per session: Not on file   Stress: Not on file  Relationships   Social connections    Talks on phone: Not on file    Gets together: Not on file    Attends religious service: Not on file    Active member of club or organization: Not on file    Attends meetings of clubs or organizations: Not on file    Relationship status: Not on file  Other Topics Concern   Not on file  Social History Narrative   Retired from [employer].       Family History: The patient's family history includes Heart attack (age of onset: 17) in his father; Pancreatic cancer in his mother. ROS:   Please see the history of present illness.     All other systems reviewed and are  negative.  EKGs/Labs/Other Studies Reviewed:    The following studies were reviewed today:  Echocardiogram 11/12/2018   1. The left ventricle has normal systolic function with an ejection fraction of 60-65%. The cavity size was normal. Left ventricular diastolic Doppler parameters are consistent with impaired relaxation. No evidence of left ventricular regional wall  motion abnormalities.  2. The right ventricle has normal systolic function. The cavity was normal. There is no increase in right ventricular wall thickness. Right ventricular systolic pressure could not be assessed.  3. The mitral valve is degenerative. Mild thickening of the mitral valve leaflet. There is mild mitral annular calcification present. Mitral valve regurgitation is moderate by color flow Doppler. The MR jet is centrally-directed.  4. The aortic valve is grossly normal.  5. The aorta is normal in size and structure.  6. When compared to the prior study: mitral regurgitation appears to have worsened. The images are very technically difficult and multiple previous studies were considered uninterpretable.  Echo 10/01/17: Study Conclusions - Left ventricle: Systolic function was mildly to moderately reduced. The estimated ejection fraction was in the range of 40% to 45%. Severe hypokinesis of the anteroseptal and anterior myocardium; consistent with ischemia or infarction in the distribution of the left anterior descending coronary artery. However, the apex appears to be spared. Due to tachycardia, there was fusion of early and atrial contributions to ventricular filling. The study is not technically sufficient to allow evaluation of LV diastolic function. - Mitral valve: Calcified annulus.  Impressions: - Very technically challenging study. Suggest an alternative imaging technique such as CT or MRI.  EKG:  none  Recent Labs: 11/12/2018: TSH 1.485 11/13/2018: ALT 11 11/18/2018: Magnesium  1.8 11/23/2018: Hemoglobin 7.2; Platelets 152 11/25/2018: BUN 46; Creatinine, Ser 3.34;  Potassium 4.6; Sodium 139   Recent Lipid Panel    Component Value Date/Time   CHOL 170 05/17/2013 0415   TRIG 210 (H) 09/16/2017 0301   HDL 25 (L) 05/17/2013 0415   CHOLHDL 6.8 05/17/2013 0415   VLDL 40 05/17/2013 0415   LDLCALC 105 (H) 05/17/2013 0415    Physical Exam:    VS:  BP 116/60    Pulse 64    Ht 5\' 7"  (1.702 m)    Wt 177 lb (80.3 kg)    SpO2 97%    BMI 27.72 kg/m     Wt Readings from Last 3 Encounters:  12/10/18 177 lb (80.3 kg)  11/24/18 193 lb 6.4 oz (87.7 kg)  03/30/18 194 lb (88 kg)     Physical Exam  Constitutional: He is oriented to person, place, and time. He appears well-developed and well-nourished. No distress.  HENT:  Head: Normocephalic and atraumatic.  Neck: Normal range of motion. Neck supple. No JVD present.  Cardiovascular: Normal rate, regular rhythm, normal heart sounds and intact distal pulses. Exam reveals no gallop and no friction rub.  No murmur heard. Pulmonary/Chest: Effort normal and breath sounds normal. No respiratory distress. He has no wheezes. He has no rales.  Abdominal: Soft. Bowel sounds are normal.  RLQ ostomy with dark liquid stool in bag. Midline abdominal dressing.   Musculoskeletal: Normal range of motion.        General: No edema.     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   Neurological: He is alert and oriented to person, place, and time.  Skin: Skin is warm and dry.  Psychiatric: He has a normal mood and affect. His behavior is normal. Judgment and thought content normal.  Vitals reviewed.    ASSESSMENT:    1. Coronary artery disease involving native coronary artery of native heart without angina pectoris   2. AKI (acute kidney injury) ([place])   3. History of atrial fibrillation   4. Essential (primary) hypertension   5. Hyperlipidemia, unspecified hyperlipidemia type   6. History of pulmonary embolus  (PE)    PLAN:    In order of problems listed above:  CAD -Per chart: MI in 2015 without stents in [place] (pt has no recollection), status post V. fib arrest and A. fib in 2019.  Patient was seen in consult by our team in 2019, not felt to be a cath candidate due to an open abdominal wound and no anginal symptoms.  He has had no outpatient follow-up since that time. -Presented to the hospital 11/11/2018 with acute renal failure with creatinine up to 9.  Troponins were elevated up to 2000 at presentation, follow-up on 11/23/2018 high-sensitivity troponin was 15. -Echocardiogram showed normal LV systolic function with EF 60-65% with no evidence of left ventricular regional wall motion abnormalities. -Patient has significant chronic comorbidities and is not a good candidate for invasive cardiac intervention. -He has not had any anginal symptoms at all. -Case discussed with DOD, Dr. Harrington Challenger. -We will plan conservative treatment and follow-up in 6 months. -We will check lipid panel for risk stratification.  Patient is treated with atorvastatin 80 mg daily as well as aspirin 81 mg.  Acute renal failure -Patient admitted to the hospital 11/11/2018 with serum creatinine of 9.44.  Last prior labs in 10/2017 showed normal creatinine.  It is unclear the cause of this however thought to possibly be related to low oral intake and patient being outside a lot. -Nephrology was  consulted while in the hospital.  Serum creatinine had stabilized and was 3.34 at hospital discharge.  Dr. Jonnie Finner felt that the AKI was secondary to prerenal insult and hypotension in the setting of solitary kidney functioning (1 kidney is atrophic and likely not functional) and prolonged heat exposure as well as presence of ostomy.  Obstruction component could not be ruled out as his renal function improved status post Foley catheter.  Possibility of dialysis was discussed several times with the patient and he was not interested.  It is felt  that his renal function may not get back to his prior baseline of 1.4.  Nephrology signed off. -Avoid ACE inhibitor/ARB/NSAIDs.  The patient is on Questran to slow GI losses. -Patient is focusing on maintaining oral hydration and avoids being outside in the heat. -Neurology advised that hypotension be avoided. -No anginal or heart failure symptoms.   History of atrial fibrillation -Following V. fib arrest in 2019. -Patient maintained sinus rhythm during recent hospitalization  Hypertension -No ACE-I/ARBs due to renal insufficiency.  Nephrology advises avoid hypotension -Blood pressure currently well controlled.  Hyperlipidemia -On atorvastatin 80 mg daily.  Followed by primary care  History of PE and multiple DVTs -Continues on Eliquis 2.5 mg twice daily  Multiple comorbidities including CVA with left-sided weakness, tracheostomy, ileostomy, open abdominal wound, chronic hypoxic respiratory failure on home oxygen at night, patient resides in skilled facility   Medication Adjustments/Labs and Tests Ordered: Current medicines are reviewed at length with the patient today.  Concerns regarding medicines are outlined above. Labs and tests ordered and medication changes are outlined in the patient instructions below:  Patient Instructions  Lifestyle Modifications to Prevent and Treat Heart Disease -Recommend heart healthy/Mediterranean diet, with whole grains, fruits, vegetables, fish, lean meats, nuts, olive oil and avocado oil.  -Limit salt intake to less than 1500 mg per day.  -Recommend being as active as possible. -Recommend avoidance of tobacco products. Avoid excess alcohol. -Keep blood pressure well controlled, ideally less than 130/80.      Signed, Daune Perch, NP  12/10/2018 10:51 AM    [place]

## 2018-12-10 ENCOUNTER — Other Ambulatory Visit: Payer: Self-pay

## 2018-12-10 ENCOUNTER — Ambulatory Visit (INDEPENDENT_AMBULATORY_CARE_PROVIDER_SITE_OTHER): Payer: Medicare Other | Admitting: Cardiology

## 2018-12-10 ENCOUNTER — Encounter: Payer: Self-pay | Admitting: Cardiology

## 2018-12-10 VITALS — BP 116/60 | HR 64 | Ht 67.0 in | Wt 177.0 lb

## 2018-12-10 DIAGNOSIS — I10 Essential (primary) hypertension: Secondary | ICD-10-CM

## 2018-12-10 DIAGNOSIS — I2510 Atherosclerotic heart disease of native coronary artery without angina pectoris: Secondary | ICD-10-CM | POA: Diagnosis not present

## 2018-12-10 DIAGNOSIS — Z8679 Personal history of other diseases of the circulatory system: Secondary | ICD-10-CM

## 2018-12-10 DIAGNOSIS — N179 Acute kidney failure, unspecified: Secondary | ICD-10-CM

## 2018-12-10 DIAGNOSIS — Z86711 Personal history of pulmonary embolism: Secondary | ICD-10-CM

## 2018-12-10 DIAGNOSIS — E785 Hyperlipidemia, unspecified: Secondary | ICD-10-CM

## 2018-12-10 LAB — LIPID PANEL
Chol/HDL Ratio: 3.2 ratio (ref 0.0–5.0)
Cholesterol, Total: 99 mg/dL — ABNORMAL LOW (ref 100–199)
HDL: 31 mg/dL — ABNORMAL LOW (ref >39)
LDL Calculated: 33 mg/dL (ref 0–99)
Triglycerides: 176 mg/dL — ABNORMAL HIGH (ref 0–149)
VLDL Cholesterol Cal: 35 mg/dL (ref 5–40)

## 2018-12-10 LAB — LDL CHOLESTEROL, DIRECT: LDL Direct: 41 mg/dL (ref 0–99)

## 2018-12-10 NOTE — Patient Instructions (Addendum)
Medication Instructions:  Your physician recommends that you continue on your current medications as directed. Please refer to the Current Medication list given to you today.   If you need a refill on your cardiac medications before your next appointment, please call your pharmacy.   Lab work: TODAY: Lipids and Direct LDL  If you have labs (blood work) drawn today and your tests are completely normal, you will receive your results only by: [name] MyChart Message (if you have MyChart) OR . A paper copy in the mail If you have any lab test that is abnormal or we need to change your treatment, we will call you to review the results.  Testing/Procedures: None   Follow-Up: At [facility], you and your health needs are our priority.  As part of our continuing mission to provide you with exceptional heart care, we have created designated Provider Care Teams.  These Care Teams include your primary Cardiologist (physician) and Advanced Practice Providers (APPs -  Physician Assistants and Nurse Practitioners) who all work together to provide you with the care you need, when you need it. You will need a follow up appointment in 6 months.  Please call our office 2 months in advance to schedule this appointment.  You may see Fransico Him, MD or one of the following Advanced Practice Providers on your designated Care Team:   [place], PA-C Melina Copa, PA-C . Ermalinda Barrios, PA-C  Any Other Special Instructions Will Be Listed Below (If Applicable).   Lifestyle Modifications to Prevent and Treat Heart Disease -Recommend heart healthy/Mediterranean diet, with whole grains, fruits, vegetables, fish, lean meats, nuts, olive oil and avocado oil.  -Limit salt intake to less than 1500 mg per day.  -Recommend being as active as possible. -Recommend avoidance of tobacco products. Avoid excess alcohol. -Keep blood pressure well controlled, ideally less than 130/80.

## 2019-01-18 IMAGING — DX DG CHEST 1V PORT
1 series · 1 of 1 positions shown · non-contrast
Comparison: September 24, 2017

CLINICAL DATA: Tracheostomy.

EXAM:
PORTABLE CHEST 1 VIEW

[chest ap]
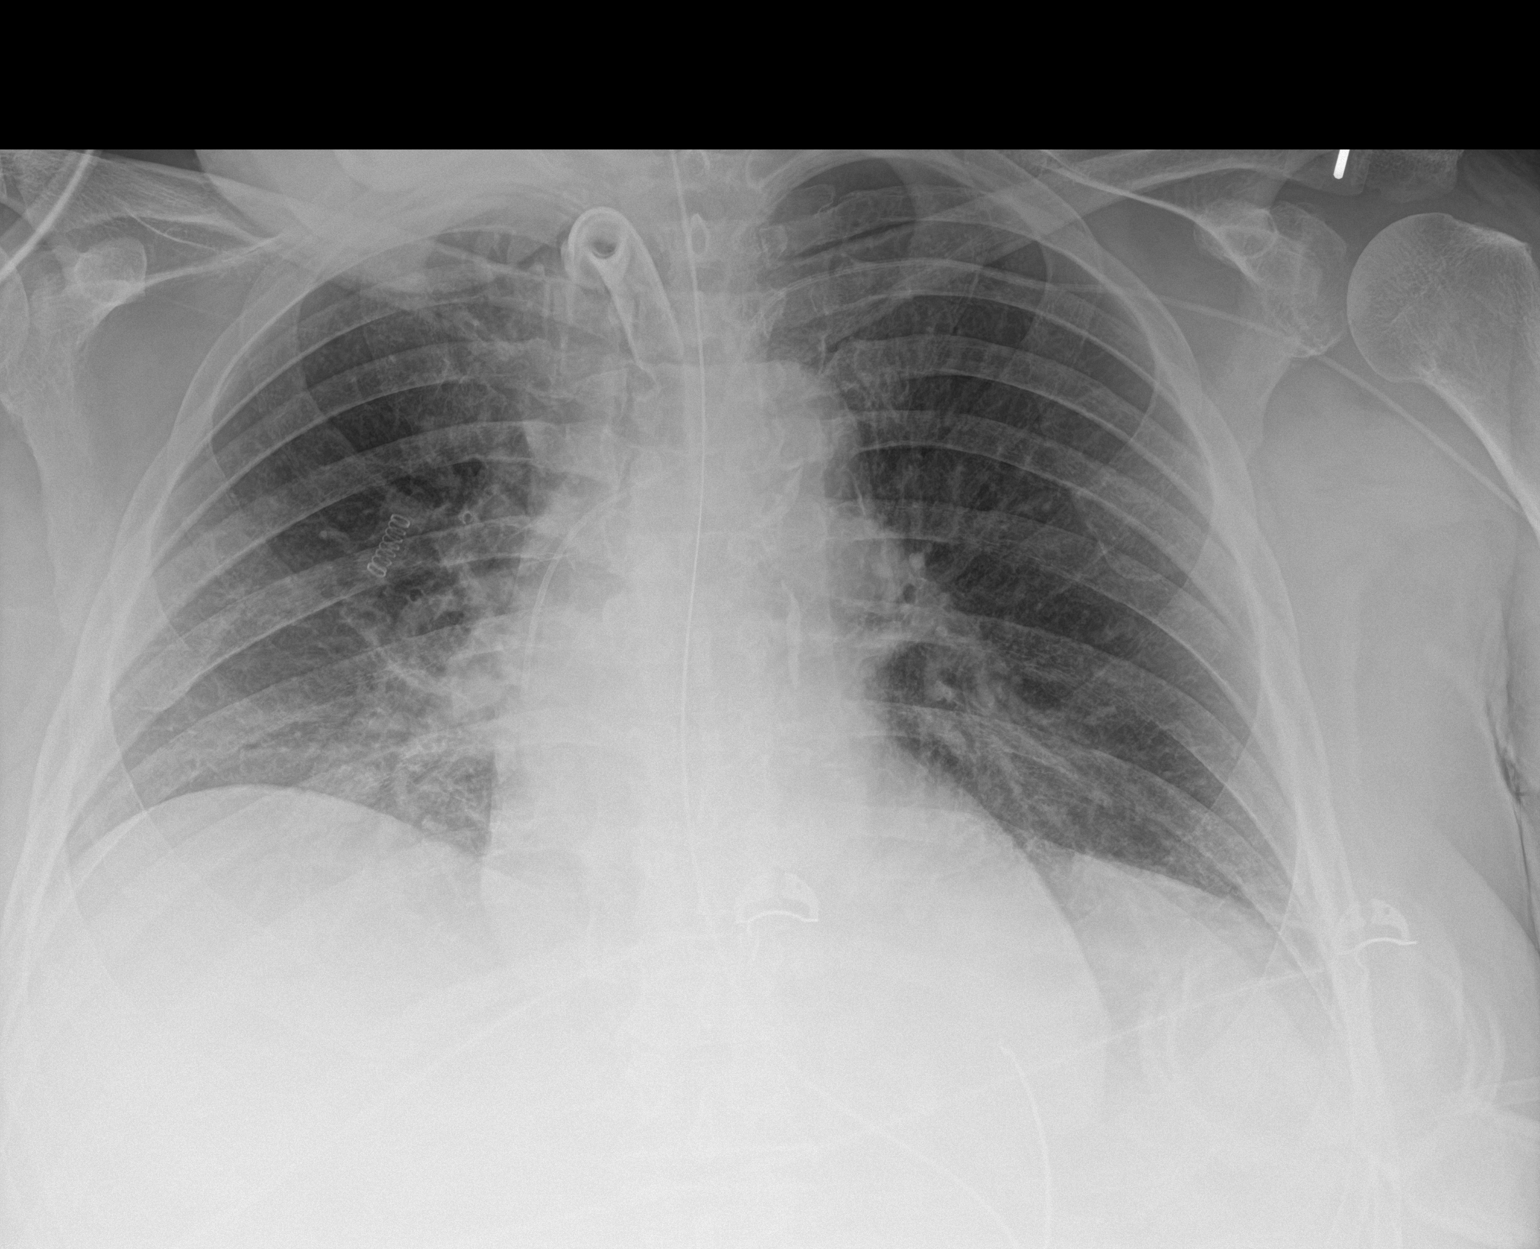

[1 of 1 positions shown; findings below may reference images not displayed]

FINDINGS: The right PICC line and tracheostomy tube are stable. The NG tube
terminates in the stomach. The cardiomediastinal silhouette is
stable. The lungs are clear with no focal infiltrate. No
pneumothorax.
IMPRESSION: Support apparatus as above.  No other abnormalities.

## 2019-01-24 IMAGING — CT CT ANGIO CHEST
2 of 6 series · 18 of 36 positions shown · IV contrast (iopamidol)
Comparison: Same day CXR

ADDENDUM:
These results were called by telephone at the time of interpretation
on 10/01/2017 at [DATE] to NP MATHAEUS LATINO , who verbally
acknowledged these results.
CLINICAL DATA: Cardiac arrest post CPR moving right-side only and
not following commands.

EXAM:
CT ANGIOGRAPHY CHEST WITH CONTRAST
TECHNIQUE: Multidetector CT imaging of the chest was performed using the
standard protocol during bolus administration of intravenous
contrast. Multiplanar CT image reconstructions and MIPs were
obtained to evaluate the vascular anatomy.
CONTRAST:  52mL 63MW0E-WPR IOPAMIDOL (63MW0E-WPR) INJECTION 76%

[Series 9: pe thins · axial · 0.77mm/px · z∈[-930,-663]mm · 17 of 301 slices shown]
[im 17/301  lung]
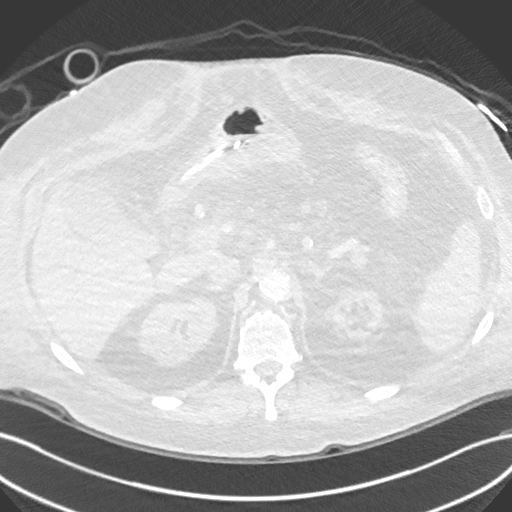
[im 34/301  mediastinal]
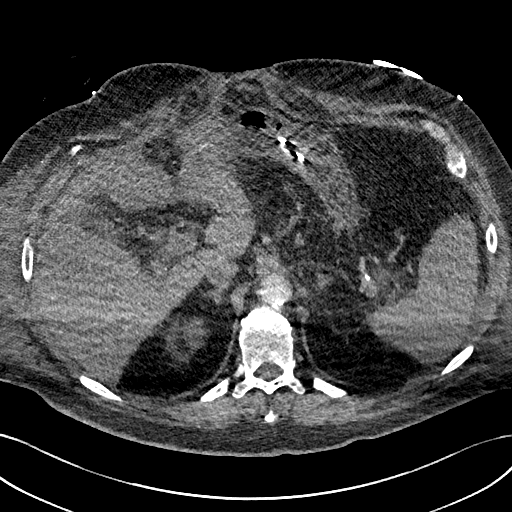
[im 51/301  lung]
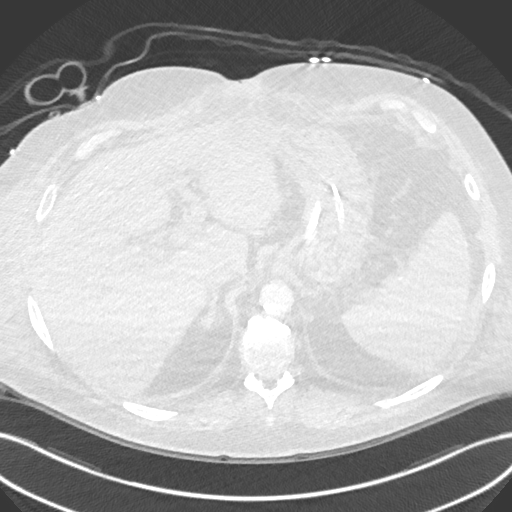
[im 67/301  mediastinal]
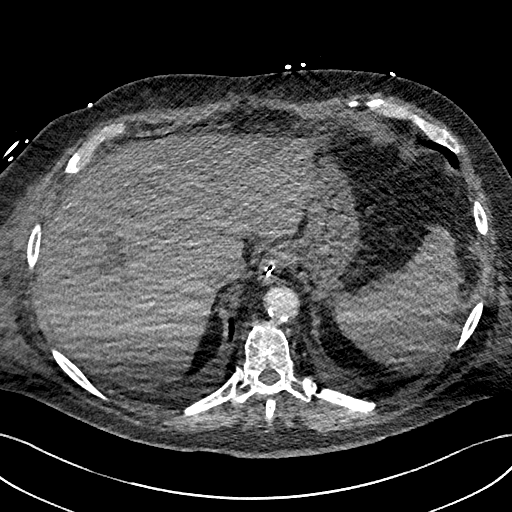
[im 84/301  lung]
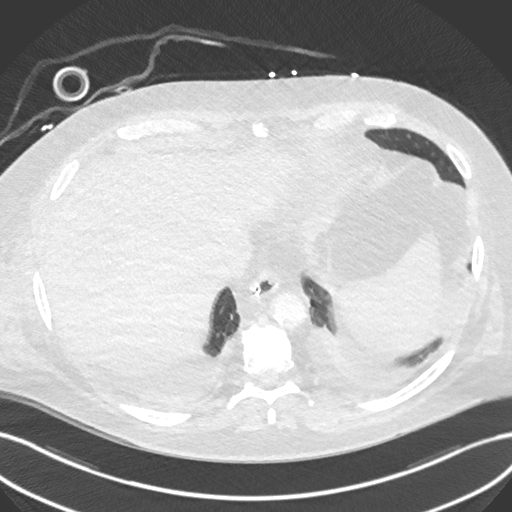
[im 101/301  mediastinal]
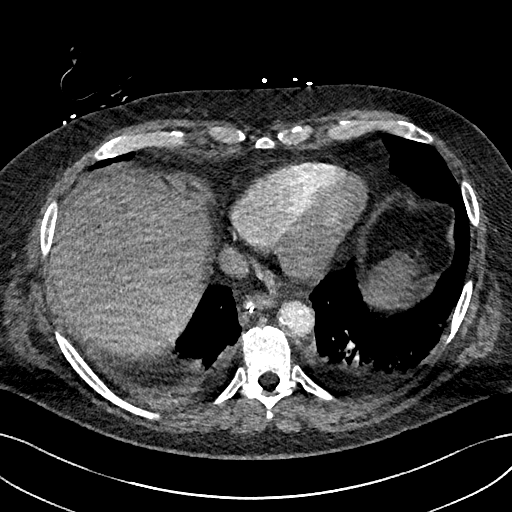
[im 117/301  lung]
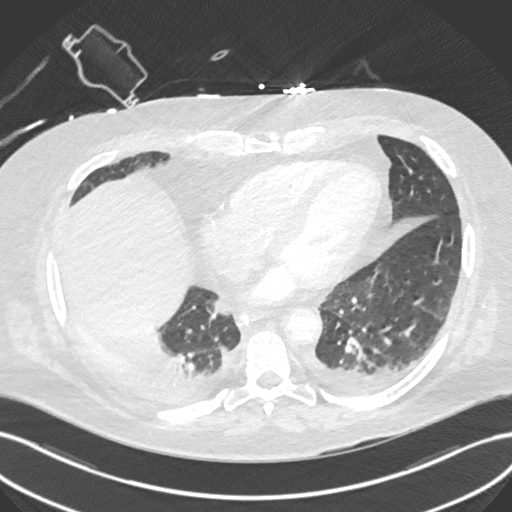
[im 134/301  mediastinal]
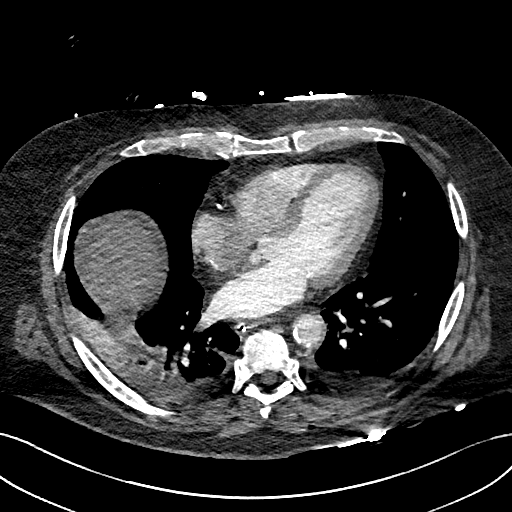
[im 151/301  lung]
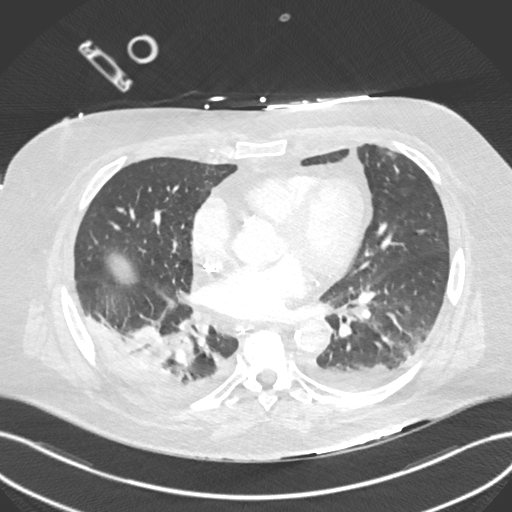
[im 167/301  mediastinal]
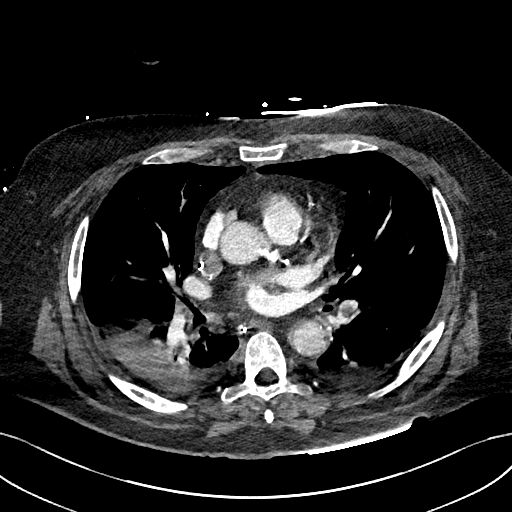
[im 184/301  lung]
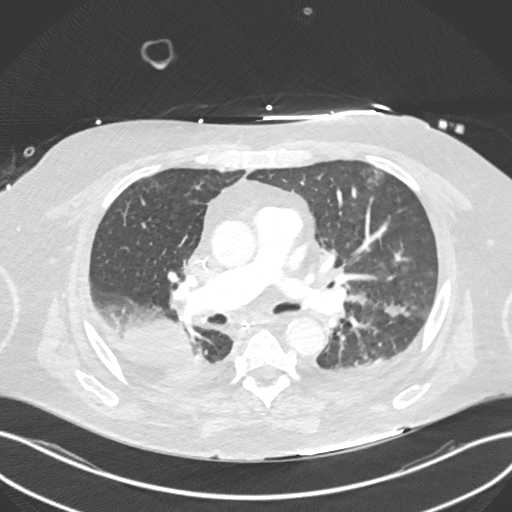
[im 201/301  mediastinal]
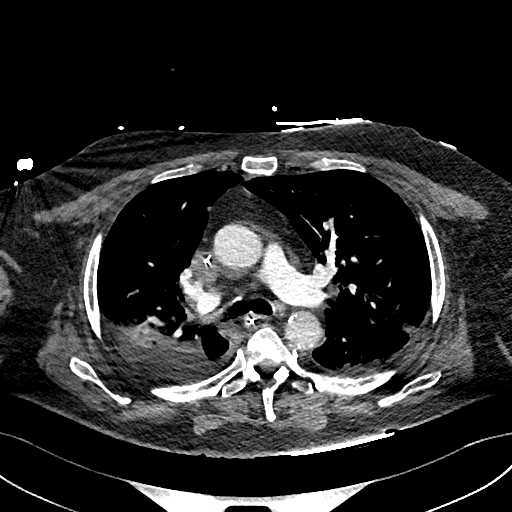
[im 217/301  lung]
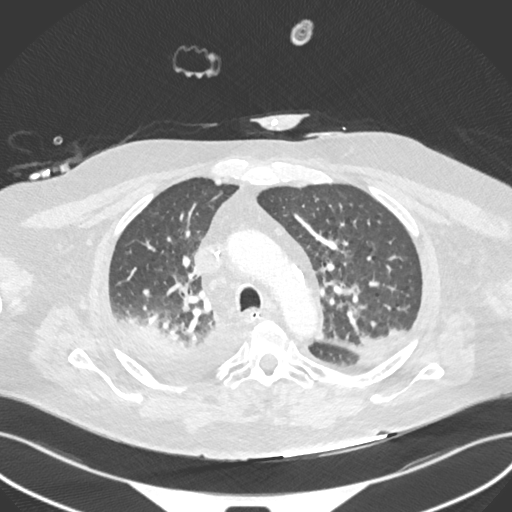
[im 234/301  mediastinal]
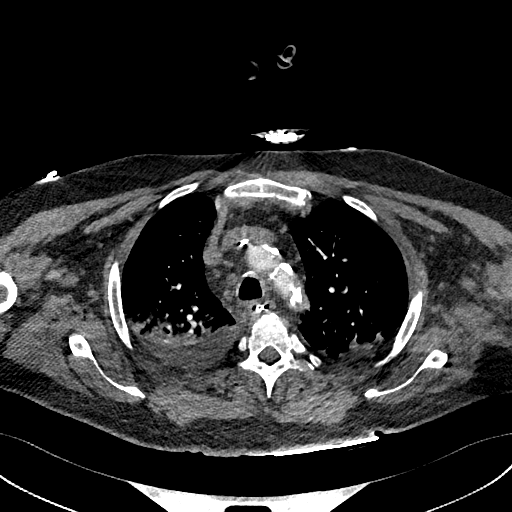
[im 251/301  lung]
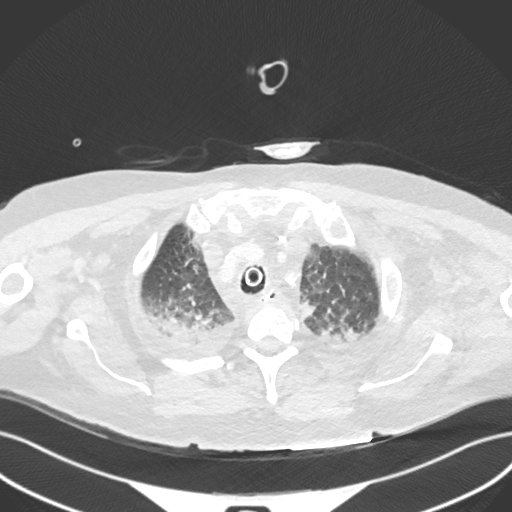
[im 267/301  mediastinal]
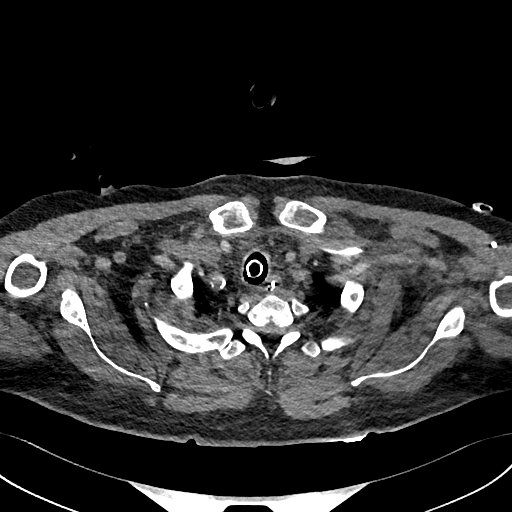
[im 284/301  lung]
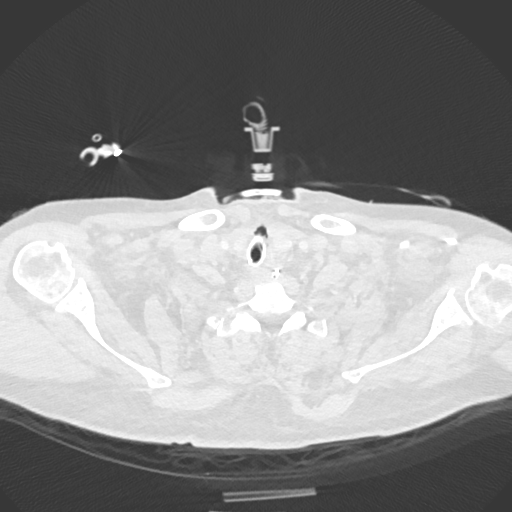

[Series 10: pe 2mm cor · coronal · 0.59mm/px · 1 of 133 slices shown]
[im 67/133  mediastinal]
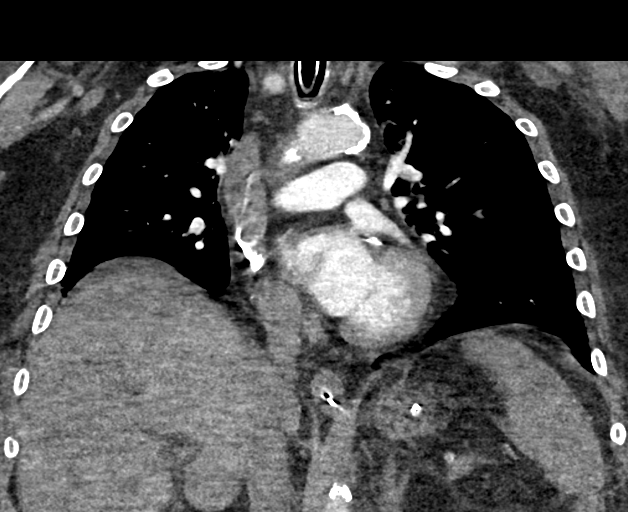

[18 of 36 positions shown; findings below may reference images not displayed]

FINDINGS: Cardiovascular: Conventional branch pattern of the great vessels
with atherosclerosis at the origins. No significant stenosis.
Moderate aortic atherosclerosis of the nonaneurysmal aorta.
Satisfactory opacification of the pulmonary arteries with left lower
lobe pulmonary emboli noted within the lobar and segmental branches.
Right heart strain noted with RV/LV ratio of approximately 0.89. No
pericardial effusion. Left sided approach PICC line is seen in the
distal SVC.

Mediastinum/Nodes: Tracheostomy tube tip is seen within the upper
third of the trachea terminating at the level of the aortic arch.
Gastric tube extends into the stomach and terminates in the duodenal
bulb. No adenopathy.

Lungs/Pleura: Small left and small to moderate right pleural
effusions with adjacent atelectasis. No pneumothorax.

Upper Abdomen: Rectus diastasis of the muscles with interposed
intra-abdominal fat. Reportedly the patient has undergone
exploratory laparotomy with incisional hernia repair and mesh
insertion which this may represent a portion of. Biliary sludge
noted within the gallbladder. Atrophic left kidney with marked renal
cortical thinning and compensatory hypertrophy of the visualized
right kidney. Normal adrenal glands and spleen.

Musculoskeletal: No chest wall abnormality. No acute or significant
osseous findings.

Review of the MIP images confirms the above findings.
IMPRESSION: 1. Acute nonocclusive pulmonary emboli to the left lower lobe. CT
evidence of right heart strain (RV/LV Ratio = 0.89) consistent with
at least submassive (intermediate risk) PE. The presence of right
heart strain has been associated with an increased risk of morbidity
and mortality. Please activate Code PE by paging [phone].
2. Cardiomegaly with bilateral small pleural effusions right greater
than left. Adjacent compressive atelectasis is identified. No
pneumothorax.
3. Support lines and tubes as above in satisfactory position.
4. Atrophic left kidney with compensatory hypertrophy of the right
kidney.
5. Partially included ventral mesh repair of the abdomen.

Aortic Atherosclerosis (IGPB8-CZ0.0).

## 2019-01-28 IMAGING — DX DG CHEST 1V PORT
1 series · 1 of 1 positions shown · non-contrast
Comparison: 09/30/2017

CLINICAL DATA: Acute respiratory failure

EXAM:
PORTABLE CHEST 1 VIEW

[chest ap]
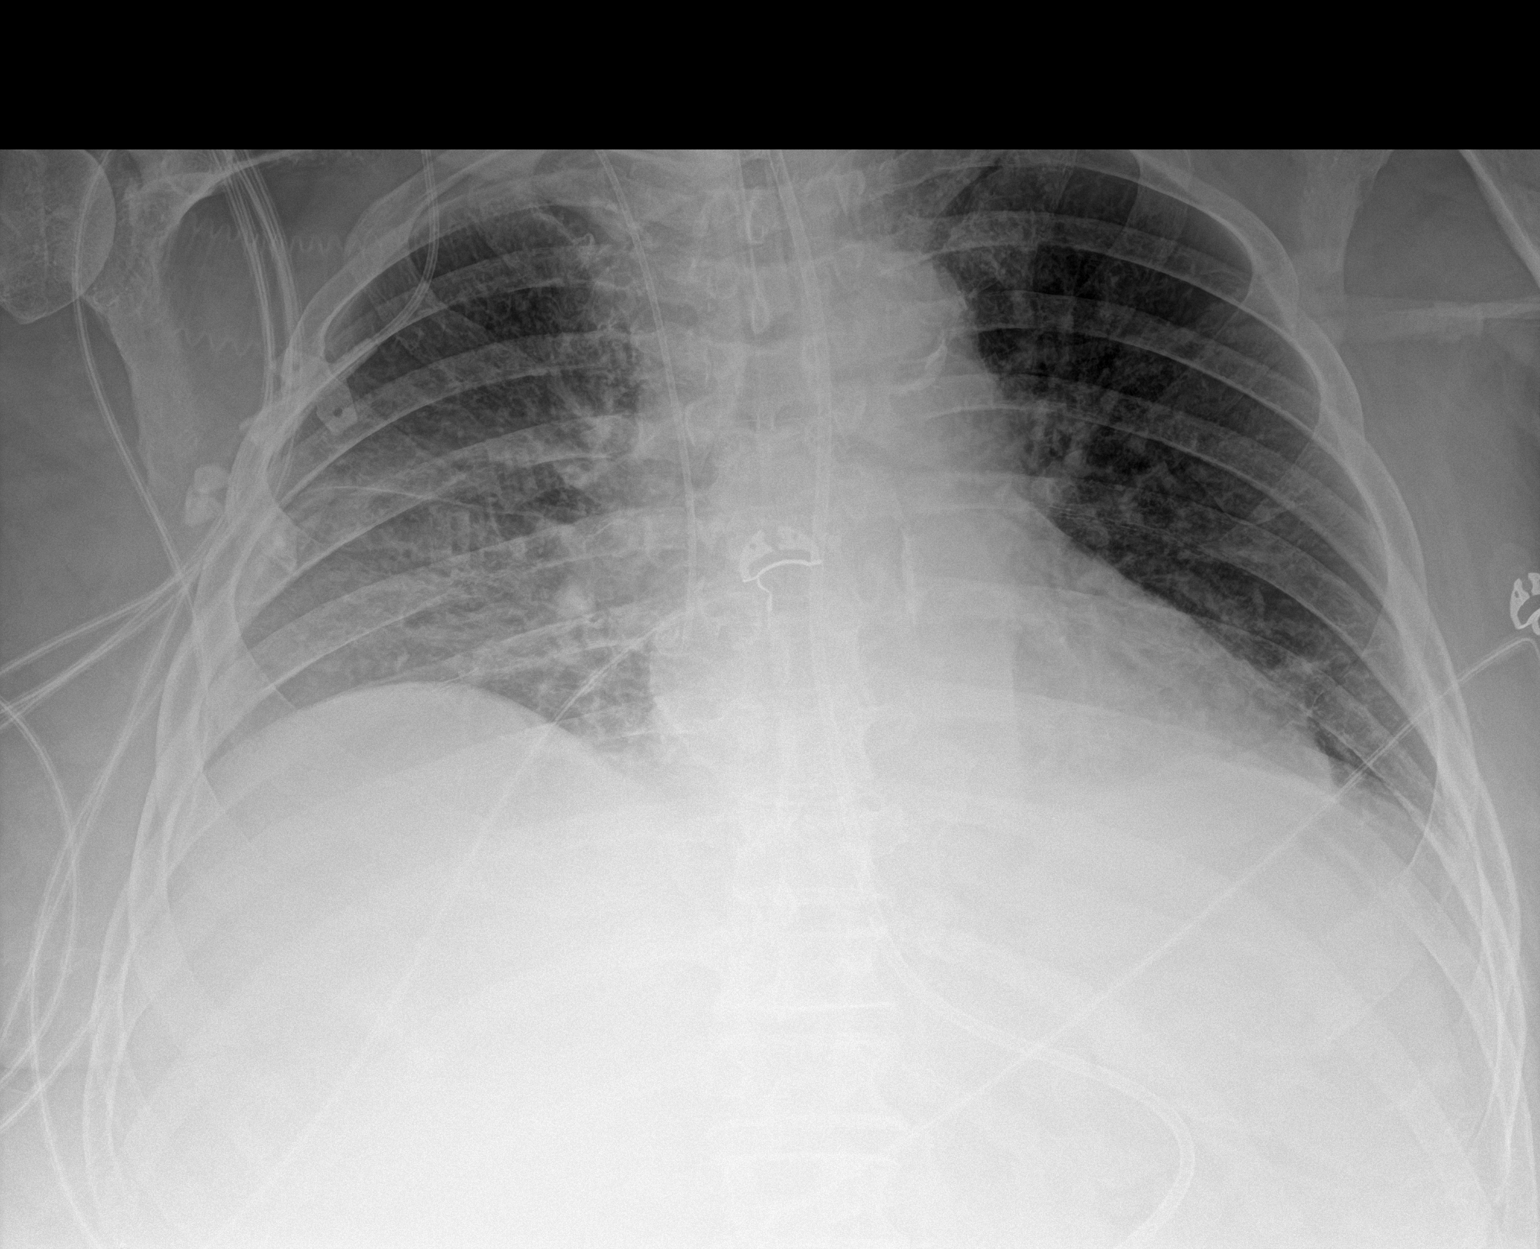

[1 of 1 positions shown; findings below may reference images not displayed]

FINDINGS: Right internal jugular PICC line has been placed with the tip in the
upper right atrium. Endotracheal tube is unchanged. Feeding tube
enters the stomach. The tip is not visualized. Mild cardiomegaly and
vascular congestion. Bibasilar atelectasis. No overt edema or
effusions.
IMPRESSION: Cardiomegaly with vascular congestion and bibasilar atelectasis.

## 2019-02-05 IMAGING — DX DG CHEST 1V PORT
1 series · 1 of 1 positions shown · non-contrast
Comparison: 10/10/2017

CLINICAL DATA: Respiratory failure

EXAM:
PORTABLE CHEST 1 VIEW

[chest ap]
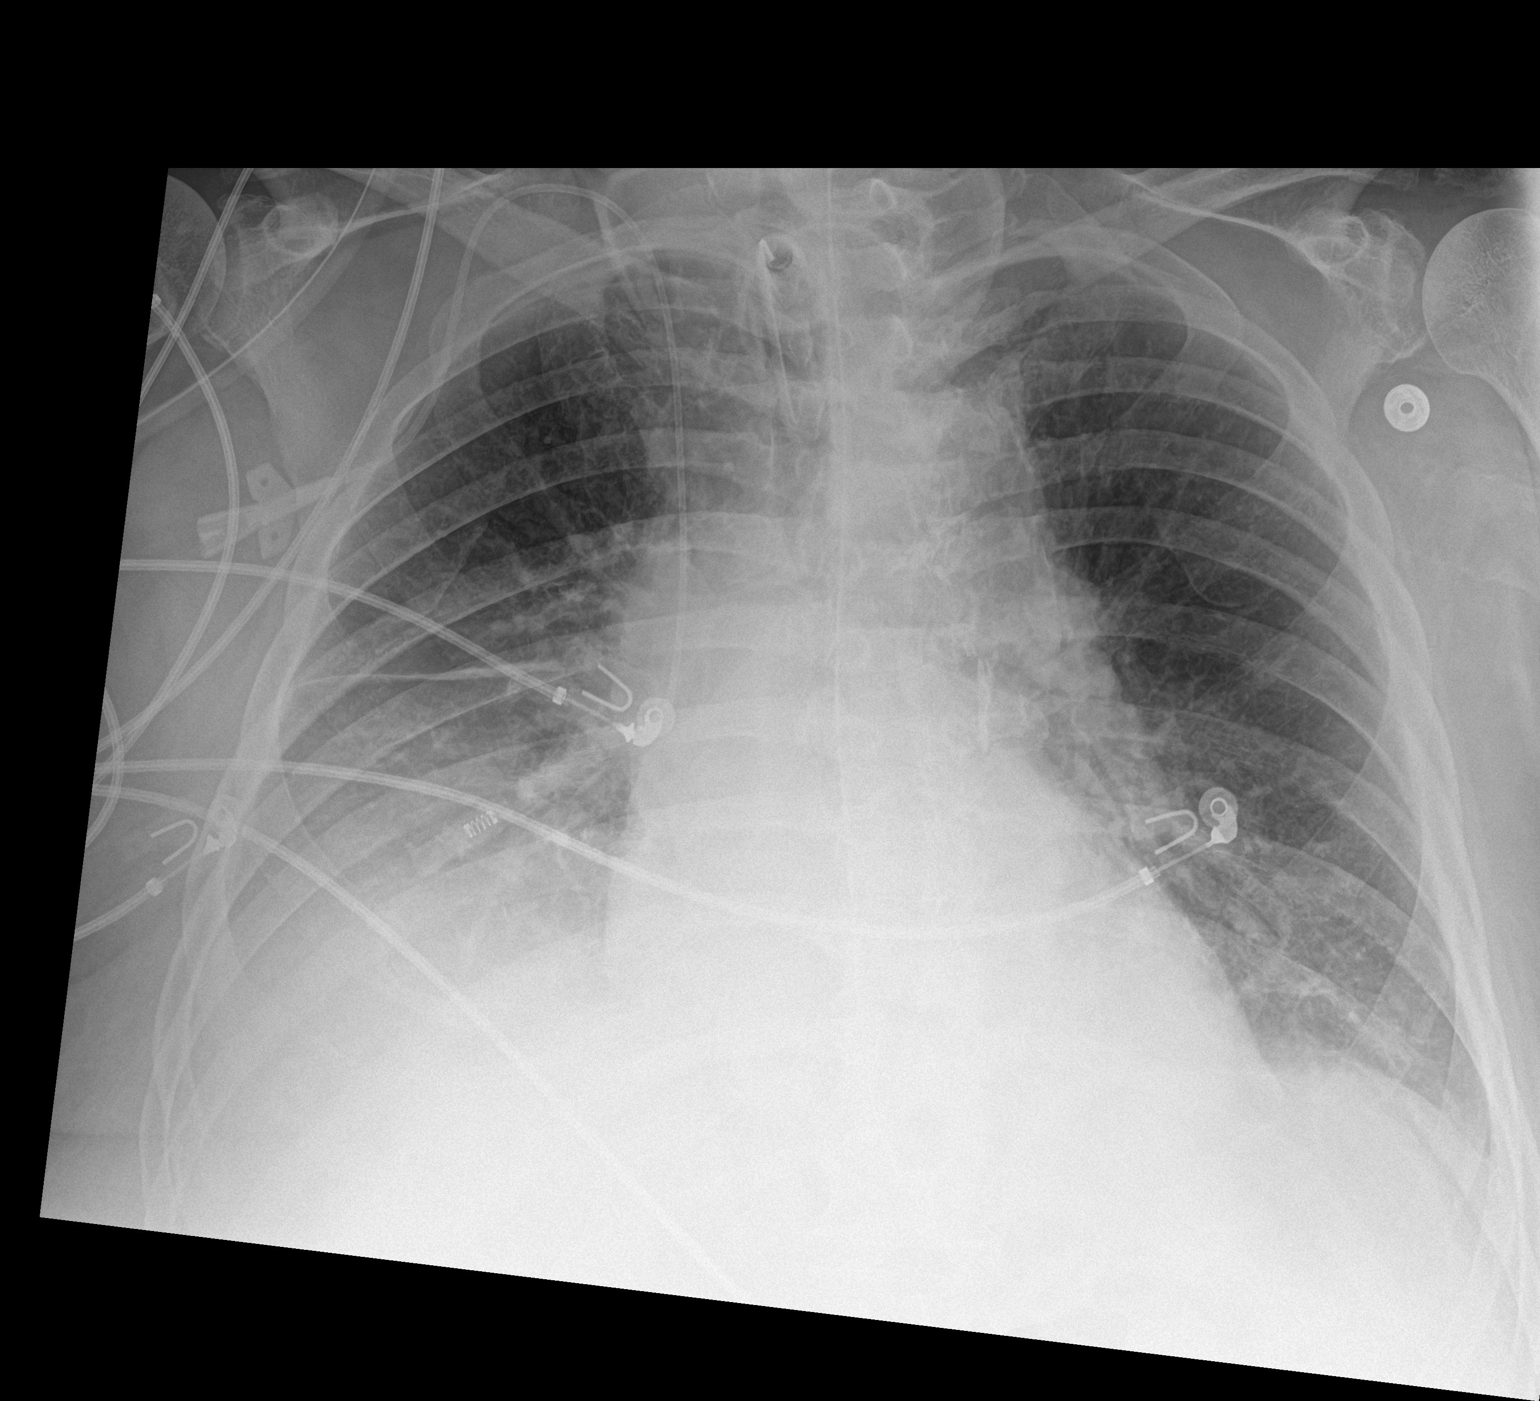

[1 of 1 positions shown; findings below may reference images not displayed]

FINDINGS: Right-sided central line with tip at the upper cavoatrial junction.
Gastric suction tube at least reaches the stomach. Tracheostomy tube
is well seated.

Low volume chest with haziness at the right more than left base,
similar to prior. No Kerley lines or air bronchogram. No
pneumothorax. Normal heart size
IMPRESSION: Unchanged hazy lung opacities compatible with atelectasis and
layering pleural fluid seen on 10/01/2017 chest CT.

## 2019-02-10 IMAGING — DX DG CHEST 1V PORT
1 series · 1 of 1 positions shown · non-contrast
Comparison: October 13, 2017

CLINICAL DATA: Respiratory failure

EXAM:
PORTABLE CHEST 1 VIEW

[chest]
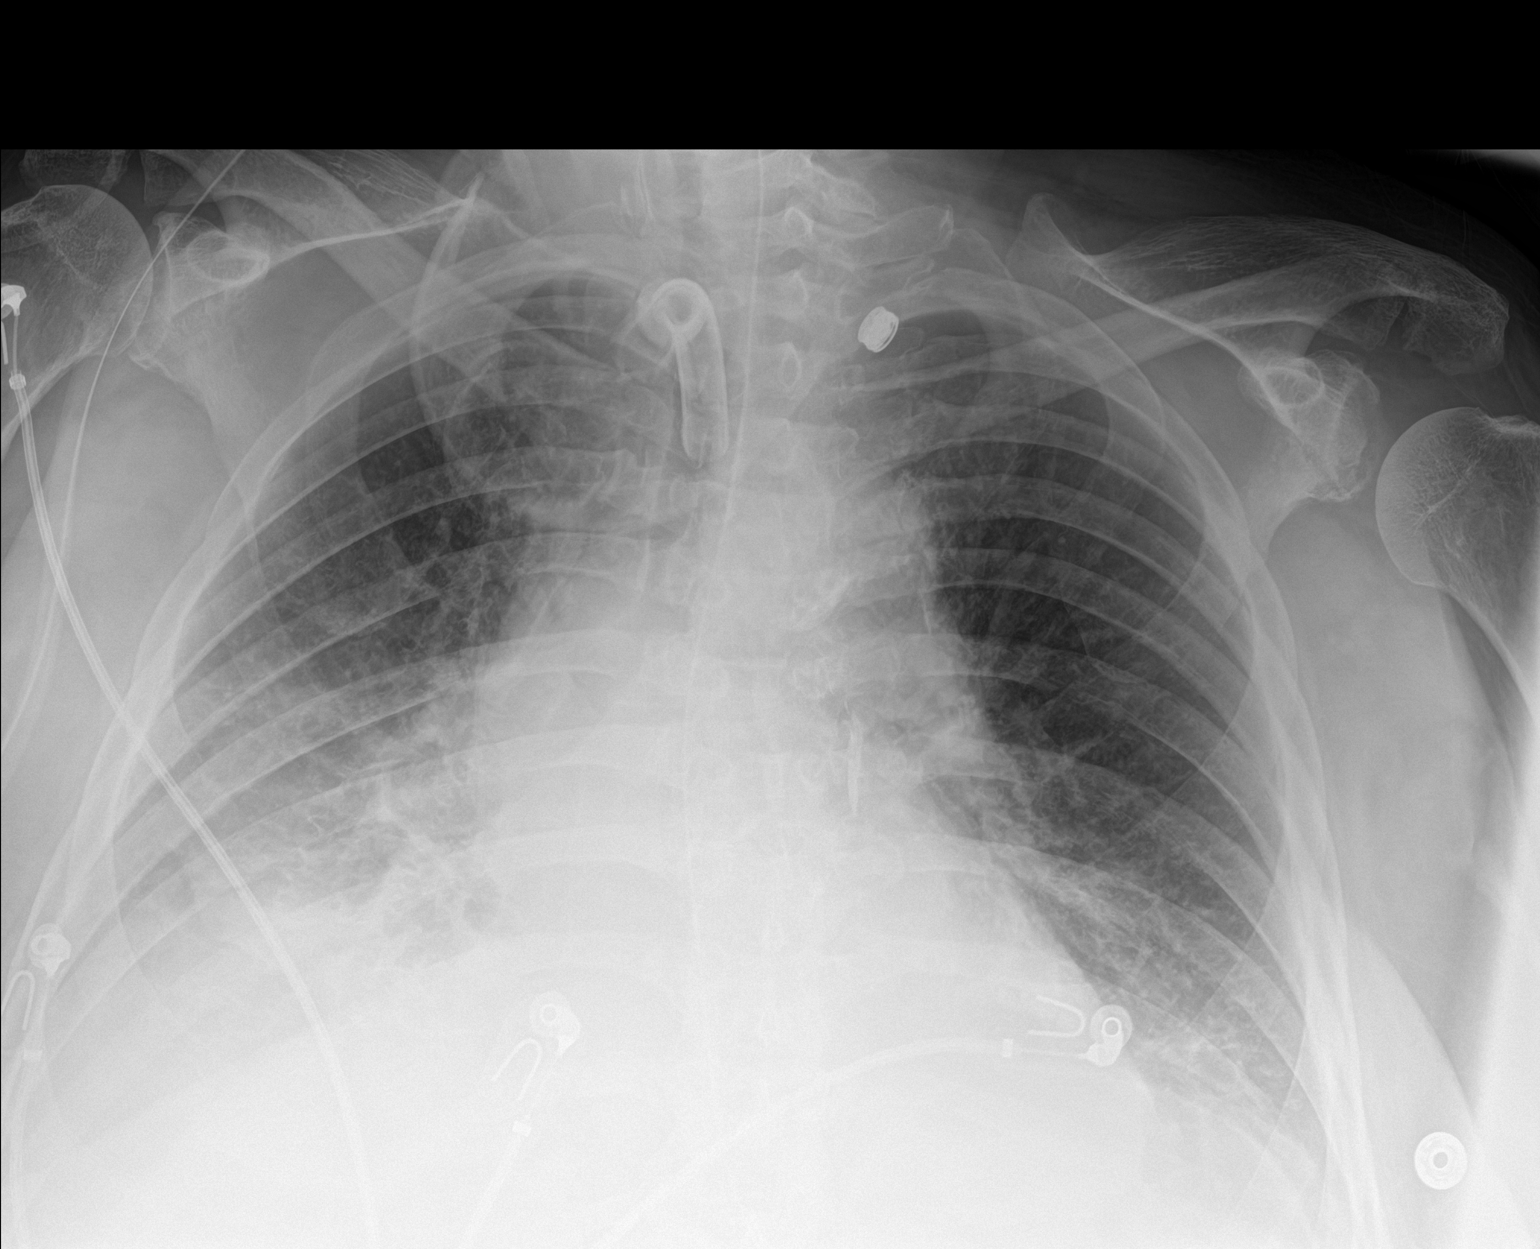

[1 of 1 positions shown; findings below may reference images not displayed]

FINDINGS: A tracheostomy tube is in good position. An NG tube terminates below
today's film. No pneumothorax. Bilateral effusions with underlying
opacities are similar to slightly worsened in the interval. No other
interval changes.
IMPRESSION: Bilateral pleural effusions with underlying opacities are similar to
slightly worsened in the interval. No other changes.

## 2020-08-17 ENCOUNTER — Encounter: Payer: Self-pay | Admitting: Cardiology

## 2020-08-17 ENCOUNTER — Ambulatory Visit (INDEPENDENT_AMBULATORY_CARE_PROVIDER_SITE_OTHER): Payer: Medicare Other | Admitting: Cardiology

## 2020-08-17 ENCOUNTER — Other Ambulatory Visit: Payer: Self-pay

## 2020-08-17 VITALS — BP 150/72 | HR 77 | Ht 68.0 in | Wt 205.6 lb

## 2020-08-17 DIAGNOSIS — Z8679 Personal history of other diseases of the circulatory system: Secondary | ICD-10-CM

## 2020-08-17 DIAGNOSIS — I2510 Atherosclerotic heart disease of native coronary artery without angina pectoris: Secondary | ICD-10-CM | POA: Diagnosis not present

## 2020-08-17 DIAGNOSIS — I10 Essential (primary) hypertension: Secondary | ICD-10-CM

## 2020-08-17 DIAGNOSIS — N179 Acute kidney failure, unspecified: Secondary | ICD-10-CM

## 2020-08-17 DIAGNOSIS — E785 Hyperlipidemia, unspecified: Secondary | ICD-10-CM

## 2020-08-17 DIAGNOSIS — Z86711 Personal history of pulmonary embolism: Secondary | ICD-10-CM

## 2020-08-17 NOTE — Progress Notes (Signed)
Cardiology Office Note:    Date:  08/17/2020   ID:  Luke Kaufman, DOB 1951-01-21, MRN [id number]  PCP:  Rosaria Ferries, MD  Cardiologist:  Fransico Him, MD    Referring MD: Satira Sark [facility]*   Chief Complaint  Patient presents with  . Coronary Artery Disease  . Atrial Fibrillation  . Hypertension  . Hyperlipidemia    History of Present Illness:    Luke Kaufman is a 70 y.o. male with a hx of CAD/MI in 2015 without stents in [facility], status post V. fib arrest in A. fib in 2019, PVD, CVA with left-sided weakness, hypertension, hyperlipidemia, tobacco use, chronic hypoxic respiratory failure on oxygen at night, PUD with GI bleeding history of PE now on Eliquis.  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  He had repeated surgeries following that with failed attempts at closure of his belly.  He also had failure to wean off the ventilator and underwent tracheostomy 08/13/2017.  The patient has a history of V. fib arrest with 11 minutes of CPR and shocked x2 in 09/2017.  He was resuscitated and developed new onset atrial fibrillation following arrest.  CTA showed a PE in the left lower lobe.  He had several DVTs in the upper and lower extremities.  Echocardiogram following arrest showed newly reduced LVEF 40-45% with severe hypokinesis and anteroseptal and anterior myocardium consistent with ischemia or LAD infarction.  Troponins were elevated, peak 1.74.  Cardiac cath was not done in the setting of an open abdominal wound.  The patient was admitted to the hospital 11/11/2018-11/25/2018 with acute renal failure with probable underlying chronic CKD stage III.  His creatinine was 9.44 on presentation.  Nephrology was consulted.  He also had acute metabolic encephalopathy felt to be related to acute renal failure.  Nephrology was consulted and renal ultrasound showed 1 very atrophied kidney.  The AKI was thought to be related  to prerenal insult and hypotension in the setting of solitary kidney functioning (1 kidney is atrophic and likely not functional) and prolonged heat exposure as well as presence of ostomy.  He had suspected sepsis but no source of infection was found.  He was treated with broad-spectrum antibiotics, initially, then discontinued with no further support for sepsis.  Creatinine had improved to 3.34 by discharge.  High-sensitivity troponins were initially 1978 and 2015 on day of presentation, 11/11/2018.  Follow-up high-sensitivity troponin was down to 15 on 11/23/2018.  He also had intermittent bradycardia and beta-blocker was held.  Echocardiogram showed EF 60-65%, no LV wall motion abnormalities.   He was seen in 2020 by my extender.  It was felt that patient was not a good candidate for invasive procedures due to co-morbidities.  Medical management was recommended at that time and he was supposed to followup 6 months later but never did.   He is here today for followup and is doing well.  He really has no SOB but has some DOE at times due to smoking and this is stable. He denies any chest pain or pressure, PND, orthopnea, LE edema, dizziness, palpitations or syncope. He is compliant with his meds and is tolerating meds with no SE.    Past Medical History:  Diagnosis Date  . Acute metabolic encephalopathy 12/21/5174  . Gastric ulcer   . Hyperlipemia   . Hypertension   . Hypospadias   . MI (myocardial infarction) ([place])   . Pulmonary embolism and infarction ([place]) 10/14/2017  .  Stroke ([place])   . Tobacco abuse     Past Surgical History:  Procedure Laterality Date  . APPLICATION OF WOUND VAC N/A 09/04/2017   Procedure: APPLICATION OF WOUND VAC and Exploration of Abdomen.;  Surgeon: Elam Dutch, MD;  Location: [place];  Service: Vascular;  Laterality: N/A;  . APPLICATION OF WOUND VAC N/A 09/06/2017   Procedure: APPLICATION OF WOUND VAC;  Surgeon: Georganna Skeans, MD;  Location: [place];  Service:  General;  Laterality: N/A;  . APPLICATION OF WOUND VAC N/A 09/08/2017   Procedure: APPLICATION OF WOUND VAC;  Surgeon: Judeth Horn, MD;  Location: [place];  Service: General;  Laterality: N/A;  . APPLICATION OF WOUND VAC N/A 09/12/2017   Procedure: APPLICATION OF WOUND VAC  (CHANGE);  Surgeon: Judeth Horn, MD;  Location: [place];  Service: General;  Laterality: N/A;  . APPLICATION OF WOUND VAC N/A 09/14/2017   Procedure: APPLICATION OF WOUND VAC;  Surgeon: Greer Pickerel, MD;  Location: [place];  Service: General;  Laterality: N/A;  . APPLICATION OF WOUND VAC N/A 09/23/2017   Procedure: APPLICATION OF NEGATIVE PRESSURE THERAPY;  Surgeon: Kieth Brightly Arta Bruce, MD;  Location: [place];  Service: General;  Laterality: N/A;  . BYPASS GRAFT AORTA TO AORTA  09/04/2017   Procedure: Aorta to Superior Mesinteric aorta bypass ultrason Left common Femoral/;  Surgeon: Elam Dutch, MD;  Location: [facility];  Service: Vascular;;  . ESOPHAGOGASTRODUODENOSCOPY N/A 08/09/2014   Procedure: ESOPHAGOGASTRODUODENOSCOPY (EGD);  Surgeon: Jerene Bears, MD;  Location: [facility];  Service: Gastroenterology;  Laterality: N/A;  . ESOPHAGOGASTRODUODENOSCOPY N/A 08/10/2014   Procedure: ESOPHAGOGASTRODUODENOSCOPY (EGD);  Surgeon: Jerene Bears, MD;  Location: [facility];  Service: Gastroenterology;  Laterality: N/A;  . ILEOSTOMY Right 09/06/2017   Procedure: POSSIBLE ILEOSTOMY;  Surgeon: Georganna Skeans, MD;  Location: [place];  Service: General;  Laterality: Right;  . INSERTION OF MESH N/A 09/23/2017   Procedure: INSERTION OF MESH;  Surgeon: Kinsinger, Arta Bruce, MD;  Location: [place];  Service: General;  Laterality: N/A;  . IR FLUORO GUIDE CV LINE RIGHT  10/04/2017  . IR US GUIDE VASC ACCESS RIGHT  10/04/2017  . LAPAROTOMY Right 09/04/2017   Procedure: EXPLORATORY LAPAROTOMY Right Colon Resection;  Surgeon: Elam Dutch, MD;  Location: [place];  Service: Vascular;  Laterality: Right;  . LAPAROTOMY N/A 09/06/2017   Procedure: EXPLORATORY  LAPAROTOMY;  Surgeon: Georganna Skeans, MD;  Location: [place];  Service: General;  Laterality: N/A;  . LAPAROTOMY N/A 09/08/2017   Procedure: EXPLORATORY LAPAROTOMY, ABDOMINAL WASHOUT, PARTIAL CLOSURE OF ABDOMEN;  Surgeon: Judeth Horn, MD;  Location: [place];  Service: General;  Laterality: N/A;  . LAPAROTOMY N/A 09/10/2017   Procedure: EXPLORATORY LAPAROTOMY WITH POSSIBLE WOUND CLOSURE ABDOMEN;  Surgeon: Judeth Horn, MD;  Location: [place];  Service: General;  Laterality: N/A;  . LAPAROTOMY N/A 09/12/2017   Procedure: EXPLORATORY LAPAROTOMY;  Surgeon: Judeth Horn, MD;  Location: [place];  Service: General;  Laterality: N/A;  . LAPAROTOMY N/A 09/14/2017   Procedure: EXPLORATORY LAPAROTOMY;  Surgeon: Greer Pickerel, MD;  Location: [place];  Service: General;  Laterality: N/A;  . LAPAROTOMY N/A 09/17/2017   Procedure: EXPLORATORY LAPAROTOMY AND PARTIAL CLOSURE OF ABDOMEN;  Surgeon: Judeth Horn, MD;  Location: [place];  Service: General;  Laterality: N/A;  . LAPAROTOMY N/A 09/19/2017   Procedure: EXPLORATORY LAPAROTOMY & DRESSING CHANGE;  Surgeon: Coralie Keens, MD;  Location: [place];  Service: General;  Laterality: N/A;  . LAPAROTOMY N/A 09/23/2017  Procedure: EXPLORATORY LAPAROTOMY, INCISIONAL HERNIA REPAIR WITH MESH INSERTION, PARTIAL CLOSURE OF SKIN;  Surgeon: Kinsinger, Arta Bruce, MD;  Location: [place];  Service: General;  Laterality: N/A;  . TRACHEOSTOMY TUBE PLACEMENT N/A 09/12/2017   Procedure: TRACHEOSTOMY;  Surgeon: Judeth Horn, MD;  Location: [place];  Service: General;  Laterality: N/A;  . VISCERAL ANGIOGRAM N/A 09/04/2017   Procedure: Non Selective MESENTERIC ANGIOGRAM,;  Surgeon: Elam Dutch, MD;  Location: [facility];  Service: Vascular;  Laterality: N/A;    Current Medications: Current Meds  Medication Sig  . acetaminophen (TYLENOL) 325 MG tablet Place 2 tablets (650 mg total) into feeding tube every 6 (six) hours. (Patient taking differently: Take 650 mg by mouth every 6 (six) hours as needed  for mild pain, fever or headache.)  . albuterol (VENTOLIN HFA) 108 (90 Base) MCG/ACT inhaler Inhale 2 puffs into the lungs every 6 (six) hours as needed for wheezing or shortness of breath.  [name] amoxicillin-clavulanate (AUGMENTIN) 500-125 MG tablet Take 1 tablet by mouth daily as needed.  [name] apixaban (ELIQUIS) 2.5 MG TABS tablet Take 2.5 mg by mouth 2 (two) times daily.  [name] ascorbic acid (VITAMIN C) 500 MG tablet Take 500 mg by mouth daily.  [name] aspirin 81 MG chewable tablet Place 1 tablet (81 mg total) into feeding tube daily. (Patient taking differently: Chew 81 mg by mouth daily.)  . atorvastatin (LIPITOR) 80 MG tablet Place 1 tablet (80 mg total) into feeding tube daily at 6 PM. (Patient taking differently: Take 80 mg by mouth daily at 6 PM.)  . cholestyramine (QUESTRAN) 4 g packet Take 4 g by mouth daily.  . diphenoxylate-atropine (LOMOTIL) 2.5-0.025 MG tablet Take 1 tablet by mouth 4 (four) times daily as needed for diarrhea or loose stools.  . ergocalciferol (VITAMIN D2) 1.25 MG (50000 UT) capsule Take 50,000 Units by mouth once a week. On thursdays  . ferrous sulfate 325 (65 FE) MG tablet Take 325 mg by mouth daily with breakfast.  . folic acid (FOLVITE) 250 MCG tablet Take 400 mcg by mouth daily.  [name] gabapentin (NEURONTIN) 100 MG capsule Take 100 mg by mouth daily.   . mirtazapine (REMERON) 7.5 MG tablet Take 7.5 mg by mouth at bedtime. For appetite stimulation  . omeprazole (PRILOSEC) 20 MG capsule Take 20 mg by mouth daily.  . OXYGEN Place 2 L/min into the nose at bedtime. Nasal cannula  . sertraline (ZOLOFT) 100 MG tablet Take 100 mg by mouth daily.   . sodium hypochlorite (DAKIN'S 1/4 STRENGTH) 0.125 % SOLN Irrigate with 1 application as directed 2 (two) times daily as needed (wound care).  . tamsulosin (FLOMAX) 0.4 MG CAPS capsule Take 0.4 mg by mouth daily.     Allergies:   Patient has no known allergies.   Social History   Socioeconomic History  . Marital status: Single    Spouse  name: Not on file  . Number of children: 1  . Years of education: Not on file  . Highest education level: Not on file  Occupational History  . Not on file  Tobacco Use  . Smoking status: Former Smoker    Packs/day: 2.00    Years: 50.00    Pack years: 100.00    Types: Cigarettes    Quit date: 08/20/2017    Years since quitting: 2.9  . Smokeless tobacco: Never Used  Vaping Use  . Vaping Use: Never used  Substance and Sexual Activity  . Alcohol use: Yes    Alcohol/week:  0.0 standard drinks    Comment: occasional  . Drug use: No  . Sexual activity: Not on file  Other Topics Concern  . Not on file  Social History Narrative   Retired from [employer] se[employer].     Social Determinants of Health   Financial Resource Strain: Not on file  Food Insecurity: Not on file  Transportation Needs: Not on file  Physical Activity: Not on file  Stress: Not on file  Social Connections: Not on file     Family History: The patient's family history includes Heart attack (age of onset: 52) in his father; Pancreatic cancer in his mother.  ROS:   Please see the history of present illness.    ROS  All other systems reviewed and negative.   EKGs/Labs/Other Studies Reviewed:    The following studies were reviewed today: none  EKG:  EKG is  ordered today.  The ekg ordered today demonstrates NSR with PACs and atrial couplets  Recent Labs: No results found for requested labs within last 8760 hours.   Recent Lipid Panel    Component Value Date/Time   CHOL 99 (L) 12/10/2018 1118   TRIG 176 (H) 12/10/2018 1118   HDL 31 (L) 12/10/2018 1118   CHOLHDL 3.2 12/10/2018 1118   CHOLHDL 6.8 05/17/2013 0415   VLDL 40 05/17/2013 0415   LDLCALC 33 12/10/2018 1118   LDLDIRECT 41 12/10/2018 1118    Physical Exam:    VS:  BP (!) 150/72   Pulse 77   Ht 5\' 8"  (1.727 m)   Wt 205 lb 9.6 oz (93.3 kg)   SpO2 98%   BMI 31.26 kg/m     Wt Readings from Last 3 Encounters:  08/17/20 205 lb  9.6 oz (93.3 kg)  12/10/18 177 lb (80.3 kg)  11/24/18 193 lb 6.4 oz (87.7 kg)     GEN:  Well nourished, well developed in no acute distress HEENT: Normal NECK: No JVD; No carotid bruits LYMPHATICS: No lymphadenopathy CARDIAC: RRR, no murmurs, rubs, gallops. Occasional ectopy RESPIRATORY:  Clear to auscultation without rales, wheezing or rhonchi  ABDOMEN: Soft, non-tender, non-distended MUSCULOSKELETAL:  No edema; No deformity  SKIN: Warm and dry NEUROLOGIC:  Alert and oriented x 3 PSYCHIATRIC:  Normal affect   ASSESSMENT:    1. Coronary artery disease involving native coronary artery of native heart without angina pectoris   2. AKI (acute kidney injury) ([place])   3. History of atrial fibrillation   4. Essential (primary) hypertension   5. Hyperlipidemia, unspecified hyperlipidemia type   6. History of pulmonary embolus (PE)    PLAN:    In order of problems listed above:  CAD -Per chart: MI in 2015 without stents in [place] (pt has no recollection), status post V. fib arrest and A. fib in 2019.  Patient was seen in consult by our team in 2019, not felt to be a cath candidate due to an open abdominal wound and no anginal symptoms.   -Presented to the hospital 11/11/2018 with acute renal failure with creatinine up to 9.  Troponins were elevated up to 2000 at presentation, follow-up on 11/23/2018 high-sensitivity troponin was 15. -Echocardiogram showed normal LV systolic function with EF 60-65% with no evidence of left ventricular regional wall motion abnormalities. -Patient has significant chronic comorbidities and was not felt to be a good candidate for invasive cardiac intervention. -he denies any anginal symptoms -continue medical management -continue atorvastatin 80 mg daily as well as aspirin 81 mg.  CKD  stage stage 4 -Patient admitted to the hospital 11/11/2018 with serum creatinine of 9.44.  Last prior labs in 10/2017 showed normal creatinine.  It is unclear the cause of this  however thought to possibly be related to low oral intake and patient being outside a lot. -Nephrology was consulted while in the hospital.  Serum creatinine had stabilized and was 3.34 at hospital discharge.  Dr. Jonnie Finner felt that the AKI was secondary to prerenal insult and hypotension in the setting of solitary kidney functioning (1 kidney is atrophic and likely not functional) and prolonged heat exposure as well as presence of ostomy.  Obstruction component could not be ruled out as his renal function improved status post Foley catheter.  Possibility of dialysis was discussed several times with the patient and he was not interested.  It is felt that his renal function may not get back to his prior baseline of 1.4.  Nephrology signed off. -Avoid ACE inhibitor/ARB/NSAIDs.  The patient is on Questran to slow GI losses. -Neurology advised that hypotension be avoided. -No heart failure symptoms.  -CKD followed by Nephrology  History of atrial fibrillation -Following V. fib arrest in 2019. -he remains in NSR with no palpitations  Hypertension -Bp borderline controlled on exam today -No ACE-I/ARBs due to renal insufficiency.   -Nephrology advises avoid hypotension -treatment of HTN per nephrology  Hyperlipidemia -LDL goal < 70 -LDL was 41 in 2020 -repeat FLP and ALT -continue on atorvastatin 80 mg daily.   -Followed by primary care  History of PE and multiple DVTs -Continues on Eliquis 2.5 mg twice daily -check BMET and CBC   Medication Adjustments/Labs and Tests Ordered: Current medicines are reviewed at length with the patient today.  Concerns regarding medicines are outlined above.  No orders of the defined types were placed in this encounter.  No orders of the defined types were placed in this encounter.   Signed, Fransico Him, MD  08/17/2020 3:11 PM    [place]

## 2020-08-17 NOTE — Patient Instructions (Signed)
Medication Instructions:  Your physician recommends that you continue on your current medications as directed. Please refer to the Current Medication list given to you today.  *If you need a refill on your cardiac medications before your next appointment, please call your pharmacy*  Lab Work: TODAY: Fasting lipids, CMET and CBC If you have labs (blood work) drawn today and your tests are completely normal, you will receive your results only by: [name] MyChart Message (if you have MyChart) OR . A paper copy in the mail If you have any lab test that is abnormal or we need to change your treatment, we will call you to review the results.  Follow-Up: At [facility], you and your health needs are our priority.  As part of our continuing mission to provide you with exceptional heart care, we have created designated Provider Care Teams.  These Care Teams include your primary Cardiologist (physician) and Advanced Practice Providers (APPs -  Physician Assistants and Nurse Practitioners) who all work together to provide you with the care you need, when you need it.  Your next appointment:   1 year(s)  The format for your next appointment:   In Person  Provider:   You may see Fransico Him, MD or one of the following Advanced Practice Providers on your designated Care Team:    Melina Copa, PA-C  Ermalinda Barrios, PA-C

## 2020-08-17 NOTE — Addendum Note (Signed)
Addended by: Antonieta Iba on: 08/17/2020 03:29 PM   Modules accepted: Orders

## 2020-08-18 LAB — COMPREHENSIVE METABOLIC PANEL
ALT: 80 IU/L — ABNORMAL HIGH (ref 0–44)
AST: 60 IU/L — ABNORMAL HIGH (ref 0–40)
Albumin/Globulin Ratio: 1.3 (ref 1.2–2.2)
Albumin: 3.5 g/dL — ABNORMAL LOW (ref 3.8–4.8)
Alkaline Phosphatase: 289 IU/L — ABNORMAL HIGH (ref 44–121)
BUN/Creatinine Ratio: 13 (ref 10–24)
BUN: 38 mg/dL — ABNORMAL HIGH (ref 8–27)
Bilirubin Total: 0.3 mg/dL (ref 0.0–1.2)
CO2: 13 mmol/L — ABNORMAL LOW (ref 20–29)
Calcium: 8.1 mg/dL — ABNORMAL LOW (ref 8.6–10.2)
Chloride: 109 mmol/L — ABNORMAL HIGH (ref 96–106)
Creatinine, Ser: 2.82 mg/dL — ABNORMAL HIGH (ref 0.76–1.27)
Globulin, Total: 2.6 g/dL (ref 1.5–4.5)
Glucose: 109 mg/dL — ABNORMAL HIGH (ref 65–99)
Potassium: 3.5 mmol/L (ref 3.5–5.2)
Sodium: 138 mmol/L (ref 134–144)
Total Protein: 6.1 g/dL (ref 6.0–8.5)
eGFR: 23 mL/min/{1.73_m2} — ABNORMAL LOW (ref >59)

## 2020-08-18 LAB — LIPID PANEL
Chol/HDL Ratio: 1.7 ratio (ref 0.0–5.0)
Cholesterol, Total: 103 mg/dL (ref 100–199)
HDL: 60 mg/dL (ref >39)
LDL Chol Calc (NIH): 30 mg/dL (ref 0–99)
Triglycerides: 54 mg/dL (ref 0–149)
VLDL Cholesterol Cal: 13 mg/dL (ref 5–40)

## 2020-08-18 LAB — CBC
Hematocrit: 31.7 % — ABNORMAL LOW (ref 37.5–51.0)
Hemoglobin: 10.7 g/dL — ABNORMAL LOW (ref 13.0–17.7)
MCH: 30.7 pg (ref 26.6–33.0)
MCHC: 33.8 g/dL (ref 31.5–35.7)
MCV: 91 fL (ref 79–97)
Platelets: 165 10*3/uL (ref 150–450)
RBC: 3.49 x10E6/uL — ABNORMAL LOW (ref 4.14–5.80)
RDW: 15.2 % (ref 11.6–15.4)
WBC: 11.5 10*3/uL — ABNORMAL HIGH (ref 3.4–10.8)

## 2020-08-30 ENCOUNTER — Inpatient Hospital Stay (HOSPITAL_COMMUNITY)
Admission: EM | Admit: 2020-08-30 | Discharge: 2020-09-05 | DRG: 871 | Disposition: A | Discharge status: Skilled Nursing Facility | Payer: Medicare Other | Attending: Family Medicine | Admitting: Family Medicine

## 2020-08-30 ENCOUNTER — Observation Stay (HOSPITAL_COMMUNITY): Payer: Medicare Other

## 2020-08-30 ENCOUNTER — Emergency Department (HOSPITAL_COMMUNITY): Payer: Medicare Other

## 2020-08-30 DIAGNOSIS — N170 Acute kidney failure with tubular necrosis: Secondary | ICD-10-CM

## 2020-08-30 DIAGNOSIS — D696 Thrombocytopenia, unspecified: Secondary | ICD-10-CM | POA: Diagnosis present

## 2020-08-30 DIAGNOSIS — J42 Unspecified chronic bronchitis: Secondary | ICD-10-CM | POA: Diagnosis present

## 2020-08-30 DIAGNOSIS — A419 Sepsis, unspecified organism: Secondary | ICD-10-CM | POA: Diagnosis not present

## 2020-08-30 DIAGNOSIS — I2510 Atherosclerotic heart disease of native coronary artery without angina pectoris: Secondary | ICD-10-CM | POA: Diagnosis present

## 2020-08-30 DIAGNOSIS — Z8674 Personal history of sudden cardiac arrest: Secondary | ICD-10-CM

## 2020-08-30 DIAGNOSIS — R778 Other specified abnormalities of plasma proteins: Secondary | ICD-10-CM

## 2020-08-30 DIAGNOSIS — N179 Acute kidney failure, unspecified: Secondary | ICD-10-CM | POA: Diagnosis present

## 2020-08-30 DIAGNOSIS — E785 Hyperlipidemia, unspecified: Secondary | ICD-10-CM | POA: Diagnosis present

## 2020-08-30 DIAGNOSIS — R0600 Dyspnea, unspecified: Secondary | ICD-10-CM

## 2020-08-30 DIAGNOSIS — Z86711 Personal history of pulmonary embolism: Secondary | ICD-10-CM

## 2020-08-30 DIAGNOSIS — I69354 Hemiplegia and hemiparesis following cerebral infarction affecting left non-dominant side: Secondary | ICD-10-CM

## 2020-08-30 DIAGNOSIS — I129 Hypertensive chronic kidney disease with stage 1 through stage 4 chronic kidney disease, or unspecified chronic kidney disease: Secondary | ICD-10-CM | POA: Diagnosis present

## 2020-08-30 DIAGNOSIS — E872 Acidosis, unspecified: Secondary | ICD-10-CM

## 2020-08-30 DIAGNOSIS — K8001 Calculus of gallbladder with acute cholecystitis with obstruction: Secondary | ICD-10-CM | POA: Diagnosis present

## 2020-08-30 DIAGNOSIS — J690 Pneumonitis due to inhalation of food and vomit: Secondary | ICD-10-CM | POA: Diagnosis present

## 2020-08-30 DIAGNOSIS — Z8249 Family history of ischemic heart disease and other diseases of the circulatory system: Secondary | ICD-10-CM

## 2020-08-30 DIAGNOSIS — I469 Cardiac arrest, cause unspecified: Secondary | ICD-10-CM

## 2020-08-30 DIAGNOSIS — R0609 Other forms of dyspnea: Secondary | ICD-10-CM

## 2020-08-30 DIAGNOSIS — I491 Atrial premature depolarization: Secondary | ICD-10-CM | POA: Diagnosis present

## 2020-08-30 DIAGNOSIS — Z9049 Acquired absence of other specified parts of digestive tract: Secondary | ICD-10-CM

## 2020-08-30 DIAGNOSIS — R0602 Shortness of breath: Secondary | ICD-10-CM | POA: Diagnosis not present

## 2020-08-30 DIAGNOSIS — E876 Hypokalemia: Secondary | ICD-10-CM | POA: Diagnosis present

## 2020-08-30 DIAGNOSIS — J9611 Chronic respiratory failure with hypoxia: Secondary | ICD-10-CM | POA: Diagnosis present

## 2020-08-30 DIAGNOSIS — R7401 Elevation of levels of liver transaminase levels: Secondary | ICD-10-CM

## 2020-08-30 DIAGNOSIS — I341 Nonrheumatic mitral (valve) prolapse: Secondary | ICD-10-CM | POA: Diagnosis present

## 2020-08-30 DIAGNOSIS — Q549 Hypospadias, unspecified: Secondary | ICD-10-CM

## 2020-08-30 DIAGNOSIS — R627 Adult failure to thrive: Secondary | ICD-10-CM | POA: Diagnosis present

## 2020-08-30 DIAGNOSIS — Z932 Ileostomy status: Secondary | ICD-10-CM

## 2020-08-30 DIAGNOSIS — F3341 Major depressive disorder, recurrent, in partial remission: Secondary | ICD-10-CM | POA: Diagnosis present

## 2020-08-30 DIAGNOSIS — R1110 Vomiting, unspecified: Secondary | ICD-10-CM

## 2020-08-30 DIAGNOSIS — I252 Old myocardial infarction: Secondary | ICD-10-CM

## 2020-08-30 DIAGNOSIS — I2699 Other pulmonary embolism without acute cor pulmonale: Secondary | ICD-10-CM

## 2020-08-30 DIAGNOSIS — I739 Peripheral vascular disease, unspecified: Secondary | ICD-10-CM | POA: Diagnosis present

## 2020-08-30 DIAGNOSIS — Z7901 Long term (current) use of anticoagulants: Secondary | ICD-10-CM

## 2020-08-30 DIAGNOSIS — R112 Nausea with vomiting, unspecified: Secondary | ICD-10-CM

## 2020-08-30 DIAGNOSIS — I248 Other forms of acute ischemic heart disease: Secondary | ICD-10-CM | POA: Diagnosis present

## 2020-08-30 DIAGNOSIS — Z9981 Dependence on supplemental oxygen: Secondary | ICD-10-CM

## 2020-08-30 DIAGNOSIS — I471 Supraventricular tachycardia: Secondary | ICD-10-CM | POA: Diagnosis present

## 2020-08-30 DIAGNOSIS — I4819 Other persistent atrial fibrillation: Secondary | ICD-10-CM | POA: Diagnosis present

## 2020-08-30 DIAGNOSIS — I4901 Ventricular fibrillation: Secondary | ICD-10-CM | POA: Diagnosis present

## 2020-08-30 DIAGNOSIS — Z8711 Personal history of peptic ulcer disease: Secondary | ICD-10-CM

## 2020-08-30 DIAGNOSIS — R058 Other specified cough: Secondary | ICD-10-CM

## 2020-08-30 DIAGNOSIS — R7989 Other specified abnormal findings of blood chemistry: Secondary | ICD-10-CM

## 2020-08-30 DIAGNOSIS — E43 Unspecified severe protein-calorie malnutrition: Secondary | ICD-10-CM

## 2020-08-30 DIAGNOSIS — G9341 Metabolic encephalopathy: Secondary | ICD-10-CM

## 2020-08-30 DIAGNOSIS — D631 Anemia in chronic kidney disease: Secondary | ICD-10-CM | POA: Diagnosis present

## 2020-08-30 DIAGNOSIS — Z66 Do not resuscitate: Secondary | ICD-10-CM | POA: Diagnosis not present

## 2020-08-30 DIAGNOSIS — Z79899 Other long term (current) drug therapy: Secondary | ICD-10-CM

## 2020-08-30 DIAGNOSIS — Z86718 Personal history of other venous thrombosis and embolism: Secondary | ICD-10-CM

## 2020-08-30 DIAGNOSIS — N184 Chronic kidney disease, stage 4 (severe): Secondary | ICD-10-CM | POA: Diagnosis present

## 2020-08-30 DIAGNOSIS — Z20822 Contact with and (suspected) exposure to covid-19: Secondary | ICD-10-CM | POA: Diagnosis present

## 2020-08-30 DIAGNOSIS — N189 Chronic kidney disease, unspecified: Secondary | ICD-10-CM | POA: Diagnosis present

## 2020-08-30 DIAGNOSIS — F17210 Nicotine dependence, cigarettes, uncomplicated: Secondary | ICD-10-CM | POA: Diagnosis present

## 2020-08-30 LAB — RESP PANEL BY RT-PCR (FLU A&B, COVID) ARPGX2
Influenza A by PCR: NEGATIVE
Influenza B by PCR: NEGATIVE
SARS Coronavirus 2 by RT PCR: NEGATIVE

## 2020-08-30 LAB — BASIC METABOLIC PANEL
Anion gap: 14 (ref 5–15)
Anion gap: 16 — ABNORMAL HIGH (ref 5–15)
BUN: 73 mg/dL — ABNORMAL HIGH (ref 8–23)
BUN: 75 mg/dL — ABNORMAL HIGH (ref 8–23)
CO2: 13 mmol/L — ABNORMAL LOW (ref 22–32)
CO2: 12 mmol/L — ABNORMAL LOW (ref 22–32)
Calcium: 7.6 mg/dL — ABNORMAL LOW (ref 8.9–10.3)
Calcium: 7.4 mg/dL — ABNORMAL LOW (ref 8.9–10.3)
Chloride: 111 mmol/L (ref 98–111)
Chloride: 111 mmol/L (ref 98–111)
Creatinine, Ser: 8.31 mg/dL — ABNORMAL HIGH (ref 0.61–1.24)
Creatinine, Ser: 7.83 mg/dL — ABNORMAL HIGH (ref 0.61–1.24)
GFR, Estimated: 6 mL/min — ABNORMAL LOW (ref >60)
GFR, Estimated: 7 mL/min — ABNORMAL LOW (ref >60)
Glucose, Bld: 88 mg/dL (ref 70–99)
Glucose, Bld: 80 mg/dL (ref 70–99)
Potassium: 3.7 mmol/L (ref 3.5–5.1)
Potassium: 3.8 mmol/L (ref 3.5–5.1)
Sodium: 138 mmol/L (ref 135–145)
Sodium: 139 mmol/L (ref 135–145)

## 2020-08-30 LAB — I-STAT ARTERIAL BLOOD GAS, ED
Acid-base deficit: 14 mmol/L — ABNORMAL HIGH (ref 0.0–2.0)
Bicarbonate: 13.5 mmol/L — ABNORMAL LOW (ref 20.0–28.0)
Calcium, Ion: 1.14 mmol/L — ABNORMAL LOW (ref 1.15–1.40)
HCT: 29 % — ABNORMAL LOW (ref 39.0–52.0)
Hemoglobin: 9.9 g/dL — ABNORMAL LOW (ref 13.0–17.0)
O2 Saturation: 97 %
Potassium: 3.7 mmol/L (ref 3.5–5.1)
Sodium: 139 mmol/L (ref 135–145)
TCO2: 15 mmol/L — ABNORMAL LOW (ref 22–32)
pCO2 arterial: 35.3 mmHg (ref 32.0–48.0)
pH, Arterial: 7.191 — CL (ref 7.350–7.450)
pO2, Arterial: 105 mmHg (ref 83.0–108.0)

## 2020-08-30 LAB — CBC WITH DIFFERENTIAL/PLATELET
Abs Immature Granulocytes: 0.02 10*3/uL (ref 0.00–0.07)
Basophils Absolute: 0 10*3/uL (ref 0.0–0.1)
Basophils Relative: 1 %
Eosinophils Absolute: 0.1 10*3/uL (ref 0.0–0.5)
Eosinophils Relative: 1 %
HCT: 31.2 % — ABNORMAL LOW (ref 39.0–52.0)
Hemoglobin: 9.9 g/dL — ABNORMAL LOW (ref 13.0–17.0)
Immature Granulocytes: 0 %
Lymphocytes Relative: 4 %
Lymphs Abs: 0.2 10*3/uL — ABNORMAL LOW (ref 0.7–4.0)
MCH: 30.1 pg (ref 26.0–34.0)
MCHC: 31.7 g/dL (ref 30.0–36.0)
MCV: 94.8 fL (ref 80.0–100.0)
Monocytes Absolute: 0.4 10*3/uL (ref 0.1–1.0)
Monocytes Relative: 7 %
Neutro Abs: 5.5 10*3/uL (ref 1.7–7.7)
Neutrophils Relative %: 87 %
Platelets: 129 10*3/uL — ABNORMAL LOW (ref 150–400)
RBC: 3.29 MIL/uL — ABNORMAL LOW (ref 4.22–5.81)
RDW: 15 % (ref 11.5–15.5)
WBC: 6.3 10*3/uL (ref 4.0–10.5)
nRBC: 0 % (ref 0.0–0.2)

## 2020-08-30 LAB — TROPONIN I (HIGH SENSITIVITY)
Troponin I (High Sensitivity): 556 ng/L (ref <18)
Troponin I (High Sensitivity): 802 ng/L (ref <18)
Troponin I (High Sensitivity): 869 ng/L (ref <18)

## 2020-08-30 LAB — URINALYSIS, ROUTINE W REFLEX MICROSCOPIC
Bilirubin Urine: NEGATIVE
Glucose, UA: NEGATIVE mg/dL
Ketones, ur: NEGATIVE mg/dL
Leukocytes,Ua: NEGATIVE
Nitrite: NEGATIVE
Protein, ur: 30 mg/dL — AB
Specific Gravity, Urine: 1.01 (ref 1.005–1.030)
pH: 5 (ref 5.0–8.0)

## 2020-08-30 LAB — LACTIC ACID, PLASMA: Lactic Acid, Venous: 0.7 mmol/L (ref 0.5–1.9)

## 2020-08-30 LAB — CK: Total CK: 1150 U/L — ABNORMAL HIGH (ref 49–397)

## 2020-08-30 LAB — BRAIN NATRIURETIC PEPTIDE: B Natriuretic Peptide: 348.1 pg/mL — ABNORMAL HIGH (ref 0.0–100.0)

## 2020-08-30 MED ORDER — ATORVASTATIN CALCIUM 80 MG PO TABS
80.0000 mg | ORAL_TABLET | Freq: Every day | ORAL | Status: DC
  Administered 2020-08-31: 80 mg via ORAL
  Filled 2020-08-30: qty 1

## 2020-08-30 MED ORDER — CHOLESTYRAMINE 4 G PO PACK
4.0000 g | PACK | Freq: Every day | ORAL | Status: DC
  Administered 2020-09-02 – 2020-09-04 (×3): 4 g via ORAL
  Filled 2020-08-30 (×5): qty 1

## 2020-08-30 MED ORDER — HEPARIN (PORCINE) 25000 UT/250ML-% IV SOLN
1400.0000 [IU]/h | INTRAVENOUS | Status: DC
  Administered 2020-08-31: 1400 [IU]/h via INTRAVENOUS
  Filled 2020-08-30 (×2): qty 250

## 2020-08-30 MED ORDER — SERTRALINE HCL 50 MG PO TABS
50.0000 mg | ORAL_TABLET | Freq: Every day | ORAL | Status: DC
  Administered 2020-08-31 – 2020-09-04 (×5): 50 mg via ORAL
  Filled 2020-08-30 (×5): qty 1

## 2020-08-30 MED ORDER — APIXABAN 2.5 MG PO TABS
2.5000 mg | ORAL_TABLET | Freq: Two times a day (BID) | ORAL | Status: DC
  Filled 2020-08-30: qty 1

## 2020-08-30 MED ORDER — STERILE WATER FOR INJECTION IV SOLN
INTRAVENOUS | Status: DC
  Filled 2020-08-30 (×5): qty 1000

## 2020-08-30 MED ORDER — PANTOPRAZOLE SODIUM 40 MG PO PACK: 40.0000 mg | PACK | Freq: Every day | ORAL | Status: DC

## 2020-08-30 MED ORDER — SODIUM CHLORIDE 0.9 % IV BOLUS
1000.0000 mL | Freq: Once | INTRAVENOUS | Status: AC
  Administered 2020-08-30: 1000 mL via INTRAVENOUS

## 2020-08-30 NOTE — Progress Notes (Signed)
ANTICOAGULATION CONSULT NOTE - Initial Consult  Pharmacy Consult for heparin Indication: hx of DVT and PE, will use therapeutic heparin for now  No Known Allergies  Patient Measurements: Height: 5\' 8"  (172.7 cm) Weight: 93.3 kg (205 lb 9.6 oz) IBW/kg (Calculated) : 68.4 Heparin Dosing Weight: 87 KG   Vital Signs: Temp: 98.6 F (37 C) (05/11 1243) Temp Source: Oral (05/11 1243) BP: 154/70 (05/11 1415) Pulse Rate: 100 (05/11 1415)  Labs: Recent Labs    08/30/20 1231 08/30/20 1241 08/30/20 1425  HGB 9.9* 9.9*  --   HCT 29.0* 31.2*  --   PLT  --  129*  --   CREATININE  --  8.31*  --   TROPONINIHS  --  556* 802*    Estimated Creatinine Clearance: 9.2 mL/min (A) (by C-G formula based on SCr of 8.31 mg/dL (H)).   Medical History: Past Medical History:  Diagnosis Date  . Acute metabolic encephalopathy 9/62/8366  . Gastric ulcer   . Hyperlipemia   . Hypertension   . Hypospadias   . MI (myocardial infarction) ([place])   . Pulmonary embolism and infarction ([place]) 10/14/2017  . Stroke ([place])   . Tobacco abuse     Medications:  Apixaban 2.5 MG BID (last dose 5/11 0900 per nursing home admin)   Assessment: 88 yom from SNF with increased weakness and SOB. Patient has a PMH significant for CAD/MI in 2015, V. Fib arrest and A. Fib 2019, PVD, CVA, HTN, PE, and O2 at home during the night. Patient is on apixaban 2.5 MG BID with last known dose at 0900 on 5/11. Most recent Hgb 9.9, Plt 129, Scr>8 (not interested in dialysis in the past). Will not bolus due to on [facility] PTA.   Goal of Therapy:  Heparin level 0.3-0.7 units/ml Monitor platelets by anticoagulation protocol: Yes   Plan:  Heparin 1400 units/hr starting @2200  8-hour heparin level and aPTT  Trend aPTT and HL until correlated Monitor for s/sx of bleeding    Cephus Slater, PharmD, MBA Pharmacy Resident [phone] 08/30/2020 6:33 PM

## 2020-08-30 NOTE — Hospital Course (Addendum)
Acute on Chronic Renal Failure with Metabolic Acidosis Patient presented with worsening SOB on 5/11 from [facility] and admitted to [facility] after found to have severe acidosis on ABG, pH 7.1 and HCO3 13 with Creatinine of 8.1. Baseline Crt is 2-3. During his admission he received IVF Sodium Bicarbonate from 5/11-5/13. Metabolic acidosis gradually improved with HCO3 of 17 and Creatinine 5.45 at discharge. Urinary Ouput was minimal during admission, foley catheter was placed for 2 days, and removed.Patient does not wishes to undergo HD.  Sepsis 2/2 Aspiration Pneumonia CXR on 5/11 showed new bibasilar infiltrates. MRSA nasal swab was positive. Started on Cefepime and Vancomycin for empiric treatment from 5/12-5/16. During hospitalization patient has used 2-5L [place], as well as HFNC. Patient was breathing on room air at discharge with SpO2 ranging from 90-93%. Patient uses 2-3L at baseline. No complaints of dyspnea at discharge. Patient was discharged with Augmentin 500 daily.   Acute Cholecystis 2/2 Gallstones RUQ U/S showed Gallstones with mild gallbladder distension. HIDA scan was consistent  with acute cholecystis. Empiric treatment with Metronidazole from 5/13-5/16. Gen Surgery consulted and determined that he is not a surgical candidate and recommended continued antibiotic treatment. Patient has remained asymptomatic during admission. WBC increased from 5.6- 15.5 during admission, with AST 114, and 57. IV antibiotics were stopped on 5/16, and patient was discharged on PO Augmentin 500 daily for 3 weeks.  HLD  Taking atovostatin 80mg  at home and continued at admission. Given that his CK was elevated 1150 at admission and found to be 1175 on 5/13, we held Atorvastatin and discontinued at discharge given that patient is returning to SNF with hospice. Patient denies any myalgias.   HTN, stable  BP was normotensive at admission and home medication of Metoprolol 12.5mg  BID was held and subsequenly restarted  5/13.   Patient had one episode of hypotension on 5/14 with BP of 84/52, in which 1 dose of Metoprolol was held, and then restarted the following day. BP was normotensive at discharge, and discharged with Metoprolol 12.5mg  BID.   Atrial Fibrillation, stable History of PAF with RVR. CHA2DS2-VASc score is 5. EKGs have been remarkable for PACs and sinus tachycardia, but no atrial fibrillation. Cardiology was consulted and recommended Metoprolol 12.5mg  BID for rate control. Home anticogulation of Eliquis was held while the patient received Heparin during admission. Eliquis was restarted 5/15. Patient will need follow up with Cardiology outpatient.  Hypokalemia Hypomagnesium Hypocalcemia During admission  he received 5 runs KCl since admission. K + was 3.4 this morning. Hypokalemia most likely iatrogenic due to intracellular shift while patient was receiving Sodium Bicarb IVF. Magnesium was also repleted with IV Magnesium and PO Magnesium Oxide tablets. Electrolytes were within normal limits at discharge.   Anemia of Chronic Disease, stable Hbg 9.9 on admission and 9.6 at discharge. He remained hemodynamically stable during admission.   Goals of Care During admission, patient spoke with Palliative Care to discuss and clarify wishes and goals of care. Given patient's acute renal failure and wishes not receive any dialysis treatment, he would like his code status to be DNR/DNI, and does not wish to return to the hospital in the event he decompensates. He will be discharge with hospice care.   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 Resides in a nursing home. Without new neurological deficit. There were no PT/OT outpatient recommendations.   CAD Hx V. Fib Cardiac arrest  Followed by Dr. Radford Pax. 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  Patient had post-surgical respiratory failure requiring tracheostomy and v-fib cardiac arrest.   Hx DVT and  Pulmonary Embolism  Home medications include Eliquis 2.5mg  BID. Held during admission in the setting of acute renal failure. He received Pharmacy dosed Heparin during his admission Eliquis was renally dosed per Pharmacy and restarted on 5/15. He will be discharged with Eliquis 2.5mg  BID.   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 Patient denies any abdominal pain, non-tender to palpation on PE. He received beside ostomy care and dressing changes.   Depression, chronic, stable Home medications include Sertaline 50mg , and was continued during admission. Patient should continue medications and follow up outpatient.   Follow up Cardiology Nephrology

## 2020-08-30 NOTE — Progress Notes (Signed)
FPTS Interim Progress Note  Patient seen at bedside for PM given watcher status.  Patient resting comfortably.  Rolled with assistance of Dr. Demaris Callander and RN.  No pressure ulcers or erythema noted to sacrum or b/l ischial tuberosities.  Patient returned back to comfortable position and asking for food.  Diet ordered.  Renal US has also resulted without sign of hydronephrosis.  Will continue with current plan.  [place], DO 08/30/2020, 7:45 PM PGY-3, [place] [facility] pager [phone]

## 2020-08-30 NOTE — ED Provider Notes (Signed)
[facility] EMERGENCY DEPARTMENT Provider Note   CSN: [id number] Arrival date & time: 08/30/20  1211     History No chief complaint on file.   Luke Kaufman is a 70 y.o. male.  Pt presents to the ED today with AMS.  Pt is unable to give much hx.  Per EMS, pt is at a SNF and has had increased weakness and sob.  Pt was given 300cc NS by the SNF prior to EMS arrival.  Pt has a significant past medical hx of CAD/MI in 2015 without stents in [facility], status post V. fib arrest in A. fib in 2019, PVD, CVA with left-sided weakness, hypertension, hyperlipidemia, tobacco use, chronic hypoxic respiratory failure on oxygen at night, PUD with GI bleeding history of PE now on Eliquis.The patient also has a history of SMA occlusion s/pright hemicolectomy and ileostomy in 08/2017. He had repeated surgeries following that with failed attempts at closure of his belly. He also had failure to wean off the ventilator and underwent tracheostomy 08/13/2017.  He has had vfib arrest and then afib.  He is on Eliquis. Pt was last admitted 2 years ago for AKI in the setting of CKD.  He had elevated troponins then.        Past Medical History:  Diagnosis Date  . Acute metabolic encephalopathy 1/74/9449  . Gastric ulcer   . Hyperlipemia   . Hypertension   . Hypospadias   . MI (myocardial infarction) ([place])   . Pulmonary embolism and infarction ([place]) 10/14/2017  . Stroke ([place])   . Tobacco abuse     Patient Active Problem List   Diagnosis Date Noted  . Acute kidney injury (AKI) with acute tubular necrosis (ATN) ([place]) 11/11/2018  . Upper airway cough syndrome 03/31/2018  . DOE (dyspnea on exertion) 03/30/2018  . 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 Acute renal failure ([place]) 10/14/2017  . Pulmonary embolism and infarction ([place]) 10/14/2017  . Acute metabolic encephalopathy 67/59/1638  . Cardiac arrest ([place]) 10/14/2017  . Non-STEMI (non-ST elevated myocardial  infarction) ([place])   . Pulmonary embolus, left ([place])   . Acute deep vein thrombosis (DVT) of distal end of left lower extremity ([place])   . Acute deep vein thrombosis (DVT) of radial vein of right upper extremity ([place])   . Left hemiparesis ([place])   . Severe protein-calorie malnutrition ([place])   . Acute encephalopathy   . Elevated troponin   . Acute on chronic respiratory failure with hypoxia ([place])   . Cardiac arrest ([place])   . AKI (acute kidney injury) ([place])   . Acute respiratory failure with hypoxemia ([place])   . Respiratory failure ([place])   . Abdominal pain 09/04/2017  . PUD (peptic ulcer disease) 09/04/2017  . Mesenteric ischemia ([place]) 09/04/2017  . 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 Left arm numbness 12/14/2015  . Numbness 12/14/2015  . Bilateral carotid artery stenosis 11/22/2015  . Recurrent major depressive disorder in partial remission ([place]) 10/19/2015  . Mitral valve prolapse 09/07/2015  . Vitamin D deficiency 08/31/2015  . Arterial atherosclerosis 04/10/2015  . Cataract 04/10/2015  . Cerebrovascular accident, old 04/10/2015  . Gonalgia 04/10/2015  . HLD (hyperlipidemia) 04/10/2015  . 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 Renal artery stenosis ([place]) 04/10/2015  . History of non-ST elevation myocardial infarction (NSTEMI) 04/10/2015  . Anemia associated with acute blood loss 08/24/2014  . Thrombocytopenia ([place])   . Hypokalemia   . Essential hypertension 08/09/2014  . Chest  pain 08/09/2014  . Near syncope 08/09/2014  . CKD (chronic kidney disease) stage 3, GFR 30-59 ml/min (H[facility]) 08/09/2014  . Tobacco abuse 08/09/2014  . Upper GI bleeding   . Gastric ulcer with hemorrhage   . Acute blood loss anemia   . Cerebral infarction ([place]) 05/16/2013  . HTN (hypertension) 05/16/2013  . 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    Past Surgical History:  Procedure Laterality Date  . APPLICATION OF WOUND VAC N/A 09/04/2017   Procedure: APPLICATION OF WOUND VAC  and Exploration of Abdomen.;  Surgeon: Elam Dutch, MD;  Location: [facility];  Service: Vascular;  Laterality: N/A;  . APPLICATION OF WOUND VAC N/A 09/06/2017   Procedure: APPLICATION OF WOUND VAC;  Surgeon: Georganna Skeans, MD;  Location: [facility];  Service: General;  Laterality: N/A;  . APPLICATION OF WOUND VAC N/A 09/08/2017   Procedure: APPLICATION OF WOUND VAC;  Surgeon: Judeth Horn, MD;  Location: [place] [facility];  Service: General;  Laterality: N/A;  . APPLICATION OF WOUND VAC N/A 09/12/2017   Procedure: APPLICATION OF WOUND VAC  (CHANGE);  Surgeon: Judeth Horn, MD;  Location: [facility];  Service: General;  Laterality: N/A;  . APPLICATION OF WOUND VAC N/A 09/14/2017   Procedure: APPLICATION OF WOUND VAC;  Surgeon: Greer Pickerel, MD;  Location: [facility];  Service: General;  Laterality: N/A;  . APPLICATION OF WOUND VAC N/A 09/23/2017   Procedure: APPLICATION OF NEGATIVE PRESSURE THERAPY;  Surgeon: Kieth Brightly Arta Bruce, MD;  Location: [facility];  Service: General;  Laterality: N/A;  . BYPASS GRAFT AORTA TO AORTA  09/04/2017   Procedure: Aorta to Superior Mesinteric aorta bypass ultrason Left common Femoral/;  Surgeon: Elam Dutch, MD;  Location: [facility] OR;  Service: Vascular;;  . ESOPHAGOGASTRODUODENOSCOPY N/A 08/09/2014   Procedure: ESOPHAGOGASTRODUODENOSCOPY (EGD);  Surgeon: Jerene Bears, MD;  Location: [facility];  Service: Gastroenterology;  Laterality: N/A;  . ESOPHAGOGASTRODUODENOSCOPY N/A 08/10/2014   Procedure: ESOPHAGOGASTRODUODENOSCOPY (EGD);  Surgeon: Jerene Bears, MD;  Location: [facility];  Service: Gastroenterology;  Laterality: N/A;  . ILEOSTOMY Right 09/06/2017   Procedure: POSSIBLE ILEOSTOMY;  Surgeon: Georganna Skeans, MD;  Location: [facility];  Service: General;  Laterality: Right;  . INSERTION OF MESH N/A 09/23/2017   Procedure: INSERTION OF MESH;  Surgeon: Kinsinger, Arta Bruce, MD;  Location: [facility];  Service: General;  Laterality: N/A;  . IR FLUORO GUIDE CV LINE RIGHT  10/04/2017  . IR US GUIDE VASC  ACCESS RIGHT  10/04/2017  . LAPAROTOMY Right 09/04/2017   Procedure: EXPLORATORY LAPAROTOMY Right Colon Resection;  Surgeon: Elam Dutch, MD;  Location: [facility];  Service: Vascular;  Laterality: Right;  . LAPAROTOMY N/A 09/06/2017   Procedure: EXPLORATORY LAPAROTOMY;  Surgeon: Georganna Skeans, MD;  Location: [facility];  Service: General;  Laterality: N/A;  . LAPAROTOMY N/A 09/08/2017   Procedure: EXPLORATORY LAPAROTOMY, ABDOMINAL WASHOUT, PARTIAL CLOSURE OF ABDOMEN;  Surgeon: Judeth Horn, MD;  Location: [facility];  Service: General;  Laterality: N/A;  . LAPAROTOMY N/A 09/10/2017   Procedure: EXPLORATORY LAPAROTOMY WITH POSSIBLE WOUND CLOSURE ABDOMEN;  Surgeon: Judeth Horn, MD;  Location: [facility];  Service: General;  Laterality: N/A;  . LAPAROTOMY N/A 09/12/2017   Procedure: EXPLORATORY LAPAROTOMY;  Surgeon: Judeth Horn, MD;  Location: [facility];  Service: General;  Laterality: N/A;  . LAPAROTOMY N/A 09/14/2017   Procedure: EXPLORATORY LAPAROTOMY;  Surgeon: Greer Pickerel, MD;  Location: [facility];  Service: General;  Laterality: N/A;  . LAPAROTOMY N/A 09/17/2017   Procedure: EXPLORATORY LAPAROTOMY AND  PARTIAL CLOSURE OF ABDOMEN;  Surgeon: Judeth Horn, MD;  Location: [facility];  Service: General;  Laterality: N/A;  . LAPAROTOMY N/A 09/19/2017   Procedure: EXPLORATORY LAPAROTOMY & DRESSING CHANGE;  Surgeon: Coralie Keens, MD;  Location: [facility];  Service: General;  Laterality: N/A;  . LAPAROTOMY N/A 09/23/2017   Procedure: EXPLORATORY LAPAROTOMY, INCISIONAL HERNIA REPAIR WITH MESH INSERTION, PARTIAL CLOSURE OF SKIN;  Surgeon: Kinsinger, Arta Bruce, MD;  Location: [facility];  Service: General;  Laterality: N/A;  . TRACHEOSTOMY TUBE PLACEMENT N/A 09/12/2017   Procedure: TRACHEOSTOMY;  Surgeon: Judeth Horn, MD;  Location: [facility] [place];  Service: General;  Laterality: N/A;  . VISCERAL ANGIOGRAM N/A 09/04/2017   Procedure: Non Selective MESENTERIC ANGIOGRAM,;  Surgeon: Elam Dutch, MD;  Location: [facility] OR;  Service: Vascular;   Laterality: N/A;       Family History  Problem Relation Age of Onset  . Pancreatic cancer Mother   . Heart attack Father 1    Social History   Tobacco Use  . Smoking status: Former Smoker    Packs/day: 2.00    Years: 50.00    Pack years: 100.00    Types: Cigarettes    Quit date: 08/20/2017    Years since quitting: 3.0  . Smokeless tobacco: Never Used  Vaping Use  . Vaping Use: Never used  Substance Use Topics  . Alcohol use: Yes    Alcohol/week: 0.0 standard drinks    Comment: occasional  . Drug use: No    Home Medications Prior to Admission medications   Medication Sig Start Date End Date Taking? Authorizing Provider  acetaminophen (TYLENOL) 325 MG tablet Place 2 tablets (650 mg total) into feeding tube every 6 (six) hours. Patient taking differently: Take 650 mg by mouth every 6 (six) hours as needed for mild pain, fever or headache. 10/09/17  Yes Allie Bossier, MD  apixaban (ELIQUIS) 2.5 MG TABS tablet Take 2.5 mg by mouth 2 (two) times daily.   Yes [provider]  ascorbic acid (VITAMIN C) 500 MG tablet Take 500 mg by mouth daily.   Yes [provider]  atorvastatin (LIPITOR) 80 MG tablet Place 1 tablet (80 mg total) into feeding tube daily at 6 PM. Patient taking differently: Take 80 mg by mouth daily at 6 PM. 10/09/17  Yes Allie Bossier, MD  Cholecalciferol (VITAMIN D3 ULTRA POTENCY) 1.25 MG (50000 UT) TABS Take 1.25 mg by mouth once a week. On Thursday's   Yes [provider]  cholestyramine (QUESTRAN) 4 g packet Take 4 g by mouth daily.   Yes [provider]  ergocalciferol (VITAMIN D2) 1.25 MG (50000 UT) capsule Take 50,000 Units by mouth once a week. On thursdays   Yes [provider]  fluticasone (FLONASE) 50 MCG/ACT nasal spray Place 1 spray into both nostrils daily.   Yes [provider]  gabapentin (NEURONTIN) 100 MG capsule Take 100 mg by mouth daily.    Yes [provider]  omeprazole  (PRILOSEC) 20 MG capsule Take 20 mg by mouth daily.   Yes [provider]  ondansetron (ZOFRAN) 4 MG tablet Take 4 mg by mouth every 8 (eight) hours as needed for nausea or vomiting.   Yes [provider]  Probiotic Product (PROBIOTIC PO) Take 1 tablet by mouth daily.   Yes [provider]  sertraline (ZOLOFT) 50 MG tablet Take 50 mg by mouth daily. 08/01/20  Yes [provider]  sodium bicarbonate 650 MG tablet Take 1,300 mg  by mouth 3 (three) times daily.   Yes [provider]  sodium chloride 0.9 % SOLN 75 mLs by CRRT route See admin instructions. 75 ml/hr intravenously every shift for abdominal pain, nausea   Yes [provider]  tamsulosin (FLOMAX) 0.4 MG CAPS capsule Take 0.4 mg by mouth daily.   Yes [provider]  OXYGEN Place 2 L/min into the nose at bedtime. Nasal cannula    [provider]    Allergies    Patient has no known allergies.  Review of Systems   Review of Systems  Respiratory: Positive for cough and shortness of breath.   All other systems reviewed and are negative.   Physical Exam Updated Vital Signs BP (!) 154/70   Pulse 100   Temp 98.6 F (37 C) (Oral)   Resp 16   Ht 5\' 8"  (1.727 m)   Wt 93.3 kg   SpO2 95%   BMI 31.26 kg/m   Physical Exam Vitals and nursing note reviewed.  Constitutional:      General: He is in acute distress.     Appearance: He is ill-appearing.  HENT:     Head: Normocephalic and atraumatic.     Right Ear: External ear normal.     Left Ear: External ear normal.     Nose: Nose normal.     Mouth/Throat:     Mouth: Mucous membranes are dry.  Eyes:     Extraocular Movements: Extraocular movements intact.     Conjunctiva/sclera: Conjunctivae normal.     Pupils: Pupils are equal, round, and reactive to light.  Cardiovascular:     Rate and Rhythm: Normal rate and regular rhythm.     Pulses: Normal pulses.     Heart sounds: Normal heart sounds.  Pulmonary:      Effort: Respiratory distress present.     Breath sounds: Rhonchi present.  Abdominal:     General: Abdomen is flat. Bowel sounds are normal.     Palpations: Abdomen is soft.     Comments: Large midline abdominal wound with colostomy  Musculoskeletal:        General: Normal range of motion.     Cervical back: Normal range of motion and neck supple.  Skin:    General: Skin is warm.     Capillary Refill: Capillary refill takes less than 2 seconds.  Neurological:     Mental Status: He is lethargic.     Comments: Left sided weakness after stroke. Pt will awaken for questions and is oriented, then goes back to asleep.     ED Results / Procedures / Treatments   Labs (all labs ordered are listed, but only abnormal results are displayed) Labs Reviewed  BASIC METABOLIC PANEL - Abnormal; Notable for the following components:      Result Value   CO2 13 (*)    BUN 73 (*)    Creatinine, Ser 8.31 (*)    Calcium 7.6 (*)    GFR, Estimated 6 (*)    All other components within normal limits  CBC WITH DIFFERENTIAL/PLATELET - Abnormal; Notable for the following components:   RBC 3.29 (*)    Hemoglobin 9.9 (*)    HCT 31.2 (*)    Platelets 129 (*)    Lymphs Abs 0.2 (*)    All other components within normal limits  BRAIN NATRIURETIC PEPTIDE - Abnormal; Notable for the following components:   B Natriuretic Peptide 348.1 (*)    All other components within normal limits  URINALYSIS, ROUTINE W REFLEX MICROSCOPIC - Abnormal; Notable for the following components:   Hgb urine dipstick LARGE (*)    Protein, ur 30 (*)    Bacteria, UA RARE (*)    All other components within normal limits  I-STAT ARTERIAL BLOOD GAS, ED - Abnormal; Notable for the following components:   pH, Arterial 7.191 (*)    Bicarbonate 13.5 (*)    TCO2 15 (*)    Acid-base deficit 14.0 (*)    Calcium, Ion 1.14 (*)    HCT 29.0 (*)    Hemoglobin 9.9 (*)    All other components within normal limits  TROPONIN I (HIGH  SENSITIVITY) - Abnormal; Notable for the following components:   Troponin I (High Sensitivity) 556 (*)    All other components within normal limits  RESP PANEL BY RT-PCR (FLU A&B, COVID) ARPGX2  CULTURE, BLOOD (ROUTINE X 2)  CULTURE, BLOOD (ROUTINE X 2)  URINE CULTURE  LACTIC ACID, PLASMA  TROPONIN I (HIGH SENSITIVITY)    EKG EKG Interpretation  Date/Time:  Wednesday Aug 30 2020 12:41:39 EDT Ventricular Rate:  100 PR Interval:  189 QRS Duration: 85 QT Interval:  360 QTC Calculation: 451 R Axis:   7 Text Interpretation: Sinus tachycardia Atrial premature complexes Anterior infarct, old Baseline wander in lead(s) V3 No significant change since last tracing Confirmed by Isla Pence [phone]) on 08/30/2020 1:23:08 PM   Radiology DG Chest Port 1 View  Result Date: 08/30/2020 CLINICAL DATA:  Shortness of breath EXAM: PORTABLE CHEST 1 VIEW COMPARISON:  November 11, 2018 FINDINGS: There is interstitial thickening without edema or consolidation. Heart is upper normal in size with pulmonary vascularity normal. No adenopathy. There is aortic atherosclerosis. No bone lesions. IMPRESSION: Interstitium is somewhat thickened, likely indicative of underlying chronic bronchitis. No edema or airspace opacity. Heart upper normal in size. Aortic Atherosclerosis (ICD10-I70.0). Electronically Signed   By: Lowella Grip III M.D.   On: 08/30/2020 13:01    Procedures Procedures   Medications Ordered in ED Medications  sodium bicarbonate 150 mEq in sterile water 1,150 mL infusion (has no administration in time range)  sodium chloride 0.9 % bolus 1,000 mL (has no administration in time range)    ED Course  I have reviewed the triage vital signs and the nursing notes.  Pertinent labs & imaging results that were available during my care of the patient were reviewed by me and considered in my medical decision making (see chart for details).    MDM Rules/Calculators/A&P                         CXR  is negative.  He said his breathing is at baseline.  Covid negative.  Pt noted to be in AKI.  Pt started on IVFs.  Pt d/w Dr. Moshe Cipro (nephrology) who recommended a bicarb drip.  Pt's troponin is elevated.  No CP.  He had elevated troponins with his last admission with AKI.  Pt d/w FP for admission.  Luke Kaufman was evaluated in Emergency Department on 08/30/2020 for the symptoms described in the history of present illness. He was evaluated in the context of the global COVID-19 pandemic, which necessitated consideration that the patient might be at risk for infection with the SARS-CoV-2 virus that causes COVID-19. Institutional protocols and algorithms that pertain to the evaluation of patients at risk for COVID-19 are in a state of rapid change based on information released by regulatory bodies including the CDC and federal  and state organizations. These policies and algorithms were followed during the patient's care in the ED.  CRITICAL CARE Performed by: Isla Pence   Total critical care time: 30 minutes  Critical care time was exclusive of separately billable procedures and treating other patients.  Critical care was necessary to treat or prevent imminent or life-threatening deterioration.  Critical care was time spent personally by me on the following activities: development of treatment plan with patient and/or surrogate as well as nursing, discussions with consultants, evaluation of patient's response to treatment, examination of patient, obtaining history from patient or surrogate, ordering and performing treatments and interventions, ordering and review of laboratory studies, ordering and review of radiographic studies, pulse oximetry and re-evaluation of patient's condition.   Final Clinical Impression(s) / ED Diagnoses Final diagnoses:  Acute renal failure, unspecified acute renal failure type ([place])  Elevated troponin    Rx / DC Orders ED Discharge Orders    None        Isla Pence, MD 08/30/20 1530

## 2020-08-30 NOTE — Consult Note (Addendum)
Taejon Irani Admit Date: 08/30/2020 08/30/2020 Riki Sheer Simmons-Robinson Requesting Physician:  Isla Pence, MD   Reason for Consult:  Acute renal failure   HPI:  Rameses Ou presented to ED with report of AMS.   Patient reported to have had increased weakness and SOB. He was transported to the ED via EMS from [facility].   Patient reports that for the last week, he has had significantly decreased UOP compared to normal. Patient reports that he has not had any upper respiratory symptoms, nausea, emesis or dysuria. He states that he has had a normal appetite but has been feeling increased weakness. He also reports increased SOB that was at its worst this AM. Patient states that he was recommended to be evaluated in the ED due to concern for his kidneys. Patient reports that he had blood work completed at the SNF recently.   Labs were significant for creatinine elevated from BL ~3 to 8.31 as well as ABG with pH of 7.191, CO2 35 and bicarbonate of 13.5. Troponin levels were also elevated at 556 and increased to 802. BNP was 348.   Chart Review shows patient was admitted in 2020 with acute renal failure with creatinine elevated to 9.47 with similar CC of weakness.   His AKI at that time did improve but was left with fairly significant CKD-  crt around 3.  He saw Dr. Royce Macadamia in late 2020.  Of note, had expressed during that hospitalization and after that dialysis was not something that he desired "I dont want that life"  Had a neighbor on dialysis that he used to help.  Pt was noted to see cardiology on 4/28 and crt was pretty good for him at 2.7.  So unclear what has happened in the last 13 days that caused this A on CRF.  He denies any new meds or illness.  His urine only shows 30 of protein and even though large blood, minimal cells   PMH Incudes:  Chronic Respiratory Failure, 3 L supplemental Oxygen at baseline   CAD, MI in 2015  CKD, Stage 4  Hx of Ventricular  Fibrillation Arrest in 2019  Atrial Fibrillation   PVD  CVA w/ left sided weakness   HTN   HLD   Tobacco Use   PUD w/ GI bleed  Hx of PE on Eliquis   SMA occlusion, s/p R hemicolectomy   Tracheostomy in 2019   Chronic Abdominal Wall Wound     Creatinine, Ser (mg/dL)  Date Value  08/30/2020 8.31 (H)  08/17/2020 2.82 (H)  11/25/2018 3.34 (H)  11/24/2018 3.39 (H)  11/23/2018 3.36 (H)  11/22/2018 3.45 (H)  11/21/2018 3.70 (H)  11/20/2018 4.21 (H)  11/19/2018 4.37 (H)  11/18/2018 5.07 (H)   I/Os: No intake or output data in the 24 hours ending 08/30/20 1701  ROS NSAIDS: patient denies  IV Contrast no recent imaging  TMP/SMX no recent abx via chart review  Hypotension patient denies symptoms of hypotension  Balance of 12 systems is negative w/ exceptions as above  PMH  Past Medical History:  Diagnosis Date  . Acute metabolic encephalopathy 5/99/3570  . Gastric ulcer   . Hyperlipemia   . Hypertension   . Hypospadias   . MI (myocardial infarction) ([place])   . Pulmonary embolism and infarction ([place]) 10/14/2017  . Stroke ([place])   . Tobacco abuse    PSH  Past Surgical History:  Procedure Laterality Date  . APPLICATION OF WOUND VAC N/A 09/04/2017  Procedure: APPLICATION OF WOUND VAC and Exploration of Abdomen.;  Surgeon: Elam Dutch, MD;  Location: [place];  Service: Vascular;  Laterality: N/A;  . APPLICATION OF WOUND VAC N/A 09/06/2017   Procedure: APPLICATION OF WOUND VAC;  Surgeon: Georganna Skeans, MD;  Location: [place];  Service: General;  Laterality: N/A;  . APPLICATION OF WOUND VAC N/A 09/08/2017   Procedure: APPLICATION OF WOUND VAC;  Surgeon: Judeth Horn, MD;  Location: [place];  Service: General;  Laterality: N/A;  . APPLICATION OF WOUND VAC N/A 09/12/2017   Procedure: APPLICATION OF WOUND VAC  (CHANGE);  Surgeon: Judeth Horn, MD;  Location: [place];  Service: General;  Laterality: N/A;  . APPLICATION OF WOUND VAC N/A 09/14/2017   Procedure: APPLICATION  OF WOUND VAC;  Surgeon: Greer Pickerel, MD;  Location: [place];  Service: General;  Laterality: N/A;  . APPLICATION OF WOUND VAC N/A 09/23/2017   Procedure: APPLICATION OF NEGATIVE PRESSURE THERAPY;  Surgeon: Kieth Brightly Arta Bruce, MD;  Location: [place];  Service: General;  Laterality: N/A;  . BYPASS GRAFT AORTA TO AORTA  09/04/2017   Procedure: Aorta to Superior Mesinteric aorta bypass ultrason Left common Femoral/;  Surgeon: Elam Dutch, MD;  Location: [facility] OR;  Service: Vascular;;  . ESOPHAGOGASTRODUODENOSCOPY N/A 08/09/2014   Procedure: ESOPHAGOGASTRODUODENOSCOPY (EGD);  Surgeon: Jerene Bears, MD;  Location: [facility];  Service: Gastroenterology;  Laterality: N/A;  . ESOPHAGOGASTRODUODENOSCOPY N/A 08/10/2014   Procedure: ESOPHAGOGASTRODUODENOSCOPY (EGD);  Surgeon: Jerene Bears, MD;  Location: [facility];  Service: Gastroenterology;  Laterality: N/A;  . ILEOSTOMY Right 09/06/2017   Procedure: POSSIBLE ILEOSTOMY;  Surgeon: Georganna Skeans, MD;  Location: [place];  Service: General;  Laterality: Right;  . INSERTION OF MESH N/A 09/23/2017   Procedure: INSERTION OF MESH;  Surgeon: Kinsinger, Arta Bruce, MD;  Location: [place];  Service: General;  Laterality: N/A;  . IR FLUORO GUIDE CV LINE RIGHT  10/04/2017  . IR US GUIDE VASC ACCESS RIGHT  10/04/2017  . LAPAROTOMY Right 09/04/2017   Procedure: EXPLORATORY LAPAROTOMY Right Colon Resection;  Surgeon: Elam Dutch, MD;  Location: [place];  Service: Vascular;  Laterality: Right;  . LAPAROTOMY N/A 09/06/2017   Procedure: EXPLORATORY LAPAROTOMY;  Surgeon: Georganna Skeans, MD;  Location: [place];  Service: General;  Laterality: N/A;  . LAPAROTOMY N/A 09/08/2017   Procedure: EXPLORATORY LAPAROTOMY, ABDOMINAL WASHOUT, PARTIAL CLOSURE OF ABDOMEN;  Surgeon: Judeth Horn, MD;  Location: [place];  Service: General;  Laterality: N/A;  . LAPAROTOMY N/A 09/10/2017   Procedure: EXPLORATORY LAPAROTOMY WITH POSSIBLE WOUND CLOSURE ABDOMEN;  Surgeon: Judeth Horn, MD;  Location:  [place];  Service: General;  Laterality: N/A;  . LAPAROTOMY N/A 09/12/2017   Procedure: EXPLORATORY LAPAROTOMY;  Surgeon: Judeth Horn, MD;  Location: [place];  Service: General;  Laterality: N/A;  . LAPAROTOMY N/A 09/14/2017   Procedure: EXPLORATORY LAPAROTOMY;  Surgeon: Greer Pickerel, MD;  Location: [place];  Service: General;  Laterality: N/A;  . LAPAROTOMY N/A 09/17/2017   Procedure: EXPLORATORY LAPAROTOMY AND PARTIAL CLOSURE OF ABDOMEN;  Surgeon: Judeth Horn, MD;  Location: [place];  Service: General;  Laterality: N/A;  . LAPAROTOMY N/A 09/19/2017   Procedure: EXPLORATORY LAPAROTOMY & DRESSING CHANGE;  Surgeon: Coralie Keens, MD;  Location: [place];  Service: General;  Laterality: N/A;  . LAPAROTOMY N/A 09/23/2017   Procedure: EXPLORATORY LAPAROTOMY, INCISIONAL HERNIA REPAIR WITH MESH INSERTION, PARTIAL CLOSURE OF SKIN;  Surgeon: Kieth Brightly Arta Bruce, MD;  Location: [place];  Service: General;  Laterality:  N/A;  . TRACHEOSTOMY TUBE PLACEMENT N/A 09/12/2017   Procedure: TRACHEOSTOMY;  Surgeon: Judeth Horn, MD;  Location: [place];  Service: General;  Laterality: N/A;  . VISCERAL ANGIOGRAM N/A 09/04/2017   Procedure: Non Selective MESENTERIC ANGIOGRAM,;  Surgeon: Elam Dutch, MD;  Location: [facility] OR;  Service: Vascular;  Laterality: N/A;   FH  Family History  Problem Relation Age of Onset  . Pancreatic cancer Mother   . Heart attack Father 54   [place]  reports that he quit smoking about 3 years ago. His smoking use included cigarettes. He has a 100.00 pack-year smoking history. He has never used smokeless tobacco. He reports current alcohol use. He reports that he does not use drugs.  Allergies No Known Allergies  Home medications Prior to Admission medications   Medication Sig Start Date End Date Taking? Authorizing Provider  acetaminophen (TYLENOL) 325 MG tablet Place 2 tablets (650 mg total) into feeding tube every 6 (six) hours. Patient taking differently: Take 650 mg by mouth every 6 (six) hours  as needed for mild pain, fever or headache. 10/09/17  Yes Allie Bossier, MD  apixaban (ELIQUIS) 2.5 MG TABS tablet Take 2.5 mg by mouth 2 (two) times daily.   Yes [provider]  ascorbic acid (VITAMIN C) 500 MG tablet Take 500 mg by mouth daily.   Yes [provider]  atorvastatin (LIPITOR) 80 MG tablet Place 1 tablet (80 mg total) into feeding tube daily at 6 PM. Patient taking differently: Take 80 mg by mouth daily at 6 PM. 10/09/17  Yes Allie Bossier, MD  Cholecalciferol (VITAMIN D3 ULTRA POTENCY) 1.25 MG (50000 UT) TABS Take 1.25 mg by mouth once a week. On Thursday's   Yes [provider]  cholestyramine (QUESTRAN) 4 g packet Take 4 g by mouth daily.   Yes [provider]  ergocalciferol (VITAMIN D2) 1.25 MG (50000 UT) capsule Take 50,000 Units by mouth once a week. On thursdays   Yes [provider]  fluticasone (FLONASE) 50 MCG/ACT nasal spray Place 1 spray into both nostrils daily.   Yes [provider]  gabapentin (NEURONTIN) 100 MG capsule Take 100 mg by mouth daily.    Yes [provider]  omeprazole (PRILOSEC) 20 MG capsule Take 20 mg by mouth daily.   Yes [provider]  ondansetron (ZOFRAN) 4 MG tablet Take 4 mg by mouth every 8 (eight) hours as needed for nausea or vomiting.   Yes [provider]  Probiotic Product (PROBIOTIC PO) Take 1 tablet by mouth daily.   Yes [provider]  sertraline (ZOLOFT) 50 MG tablet Take 50 mg by mouth daily. 08/01/20  Yes [provider]  sodium bicarbonate 650 MG tablet Take 1,300 mg by mouth 3 (three) times daily.   Yes [provider]  sodium chloride 0.9 % SOLN 75 mLs by CRRT route See admin instructions. 75 ml/hr intravenously every shift for abdominal pain, nausea   Yes [provider]  tamsulosin (FLOMAX) 0.4 MG CAPS capsule Take 0.4 mg by mouth daily.   Yes [provider]  OXYGEN Place 2 L/min into the nose at  bedtime. Nasal cannula    [provider]    Current Medications Scheduled Meds: Continuous Infusions: .  sodium bicarbonate (isotonic) infusion in sterile water     PRN Meds:.  CBC Recent Labs  Lab 08/30/20 1231 08/30/20 1241  WBC  --  6.3  NEUTROABS  --  5.5  HGB 9.9*  9.9*  HCT 29.0* 31.2*  MCV  --  94.8  PLT  --  175*   Basic Metabolic Panel Recent Labs  Lab 08/30/20 1231 08/30/20 1241  NA 139 138  K 3.7 3.7  CL  --  111  CO2  --  13*  GLUCOSE  --  88  BUN  --  73*  CREATININE  --  8.31*  CALCIUM  --  7.6*    Physical Exam  Blood pressure (!) 154/70, pulse 100, temperature 98.6 F (37 C), temperature source Oral, resp. rate 16, height 5\' 8"  (1.727 m), weight 93.3 kg, SpO2 95 %.  GEN: elderly male, sleepy appearing with [place] in nostrils, lying supine in hospital stretcher  ENT: dry MM and lips  EYES: no scleral icterus CV: radial pulses palpable, heart sounds difficult to assess due to loud respiratory sounds  PULM: upper airway transmitted sounds, unable to appreciate crackles at lung bases increased WOB, on [place] 2.5 L  ABD: bandage for chronic abdominal wound, no tenderness to palpation, bowel sounds appreciated, left abdominal ostomy bag with normal appearing output, abdomen soft  Superficial but soupy middle abdominal wound -  Slightly foul smelling SKIN: dry skin on bilateral LEs  EXT: pitting edema in BLEs    Assessment Phillips Goulette is a 70 y.o. male presenting with malaise found to have acute renal failure & metabolic acidosis with increased respiratory effort likely due to compensation for metabolic derangements in setting of worsening renal failure; renal abnormalities likely due to pre-renal vs post renal etiology.   1. Acute Renal Failure 2. Metabolic Acidosis 3. CAD w/ hx of MI  4. Atrial Fibrillation on Eliquis   5. HTN   Plan 1. Acute Renal Failure: Creatinine significantly elevated at 8.3 from ~3 at BL, no acute indications for HD,  K 3.7, bicarb low at 13 (on bicarb fluids), Patient follows with nephrology and states that he continues to decline beginning HD as he does not want that type of lifestyle 1. Daily monitoring of creatinine 2. Strict I/Os 3. Renal ultrasound  4. IVFs, bicarbonate  5. Ordered foley catheter  2. Metabolic Acidosis: Na bicarb in sterile water 133mL/hr for severely depleted bicarbonate of 13 on admission 3. HTN: BP elevated 102H systolic, no home BP medications on file, will monitor as allow fluid levels to stabilize  4. AF: continue Eliquis  5. Elevated Troponin: 556 initially, continue to trend troponin 6. Anemia: Hgb on admission 9, recommend monitoring,  per primary - may consider ESA  Makiera Simmons-Robinson  08/30/2020, 5:01 PM   Patient seen and examined, agree with above note with above modifications.  Briefly , this is a medically complex SNF patient-  HTN, O2 dep resp failure, CAD, PAD, s/p hemicolectomy with ostomy- history of a cardiac arrest and also a signif episode of AKI in 2020 felt due to ATN where he was discovered to have a solitary functioning kidney.  He presented at that time with a crt of 9 which did improve to 3 without dialysis.  Pt had refused dialysis and still does-  crt at baseline 13 days ago during cardiology visit at 2.7.  He now presents with A on CRF.  He has edema and a SBP of 150 so dont think he has significant volume depletion.  Agree for now with bicarb based IVF to help correct his metabolic acidosis.  Given that urine is fairly bland and pt reports dec UOP will place foley and check renal ultrasound to look for  obstruction.  Hope to support him with meds and again see improvement of renal function.  He does not have acute indications for dialysis and has made it clear to more than one of [place] that a dialysis lifestyle is not what he desires  Corliss Parish, MD 08/30/2020

## 2020-08-30 NOTE — Progress Notes (Signed)
Was present for initial history and physical.  We asked his CODE STATUS and he reported that he wanted to be a full code.  Once reviewing the chart we realized that there was a note stating that he had a DNR and a MOST form at bedside.  Went down to clarify and DNR was on bedside table covered up by other paperwork.  Asked the patient about his CODE STATUS and if he would like a DNR given that he already had filled out the paperwork.  Patient reported that he must of been a in a bad mood when he filled out paperwork out but that he does not wish to be a DNR and is requesting that to be changed to a full code.  I discussed that that would mean having CPR if his heart stopped as well as intubation if needed to help him breathe and patient was agreeable to this.  Patient does not lack capacity at this time and so DNR paperwork was destroyed.  CODE STATUS changed in EHR.

## 2020-08-30 NOTE — ED Triage Notes (Signed)
Pt arrives via EMS from [facility] with complaints of increased weakness. Pt on 3liters basline. Pt breathing labored. Denies pain. Stomach wound present. Pt alert and oriented X4.   DNR and MOST form at bedside.

## 2020-08-30 NOTE — H&P (Addendum)
[facility] Admission History and Physical Service Pager: [phone]  Patient name: Luke Kaufman Medical record number: [id number] Date of birth: 07/28/1950 Age: 70 y.o. Gender: male  Primary Care Provider: Rosaria Ferries, MD Consultants: Nephrology  Code Status: FULL  Preferred Emergency Contact: Ask patient   Chief Complaint: Shortness of breath, weakness   Assessment and Plan: Luke Kaufman is a 70 y.o. male presenting with increased weakness and SOB. PMH is significant for CAD/MI in 2015, , PVD, CVA, hypertension, hyperlipidemia, tobacco use, chronic hypoxic respiratory failure on oxygen at night, PUD with GI bleed, history of PE  Acute on Chronic Renal Failure  Metabolic Acidosis  Patient presented with worsening SOB this morning, prompting him to come in from SNF. Creatinine today 8.31, increased from 2.82 on 4/28. ED work up also notable for bicarb of 13 with arterial pH 7.1. Nephrology consulted and patient started on bicarb drip for metabolic acidosis. Does not have indication for emergent dialysis. CXR with thickened interstitium, likely indicative of underlying chronic bronchitis. No edema or airspace opacity. Renal US with chronic left renal atrophy and sequela of chronic renal disease, and mild increased right renal parenchymal echogenicity consistent with chronic medical renal disease. No hydronephrosis. Of note, patient had previous admission on 8/20 for similar condition. Thought at that time to be related to ATN. Uncertain cause of acute renal failure- patient denies recent medication use. With decreased urine output will need to evaluate for obstruction. Additionally does have several risk factors (HTN, HLD, hx CVA and cardiac arrest) with elevated troponins concerning for infarction. Nephrology consulted in the ED  -Admit to [facility], attending Dr. Thompson Grayer  -Nephrology consulted, appreciate recommendations   Bicarb gtt 135mEq at 125  mL/hr   Foley   Renal U/S to evaluate for obstruction  - Patient appears slightly volume up at this time, monitor fluid status closely -Vitals per floor protocol -PT/OT eval and treat  -BMP, Mg, Phos daily -Check BMP overnight- monitor electrolytes closely  -Cardiac telemetry -Strict I/O -Daily weights   Elevated Troponins Troponins 556>802.  EKG sinus tachycardia heart rate 100, with premature atrial complexes, without ST elevation concerning for MI. Patient without chest pain which is reassuring.  Cardiology not following but did recommend trending troponins but that it is likely due to renal failure.  EKG doesn't show ST changes. Not a candidate for invasive cardiac catheterization -Serial Troponin's -Low threshold to consult cardiology if clinically worsens or if significant elevation in troponin's   Anemia of Chronic Disease Hemoglobin 9.9.  Transfusion threshold 8, baseline appears to be around 8.5. -Daily CBC -Consider starting ESA   HLD: Chronic, stable  Recent lipid panel performed on 4/28.  LDL 30 (goal <70). Home medications include atorvastatin 80 mg, cholestyramine 4mg . -Continue atorvastatin 80 mg  -Continue cholestyramine 4 g daily  HTN: Chronic   BP ranging 137-154/60-84, most recently 154/70. Not on any home medications. -Monitor BP's   CVA: Chronic, stable  With residual chronic left arm flaccid paralysis. Resides in a nursing home. Without new neurological deficit.  -PT/OT consult inpatient -Continue Atorvastatin 80 mg   Hx DVT and Pulmonary Embolism  In 2019, discovered s/p cardiac arrest.  On Eliquis 2.5 mg BID. -Heprin per pharmacy due to acute renal failure   SMA occlusion s/pright hemicolectomy and ileostomy in 08/2017 Had repeated surgeries with failed attempts at closure of abdomen   Hx V. Fib Cardiac arrest  In 2020 after emergent surgery for SMA obstruction and mesenteric ischemia. Patient  had post-surgical respiratory failure requiring  tracheostomy and v-fib cardiac arrest. Following this had afib and was placed on Eliquis  -Tele monitoring  Depression: Chronic, stable -Continue Sertraline 50 mg daily   Tobacco Use Smokes 5-6 cigarettes/day.  -Offer Nicotine patch as needed   FEN/GI: Heart Healthy  Prophylaxis: Eliquis 2.5 mg BID  Disposition: Progressive   History of Present Illness:  Luke Kaufman is a 70 y.o. male presenting with AMS and worsening SOB.   Patient presented with AMS and unable to give much history as he is sleeping during exam.  He states that he has been having increased shortness of breath in the past week and worsened this morning.  Does endorse needing oxygen at night. Denies CP  Endorses decreased urination. Urinated at 9am this morning but did not look at it. Denies burning on urination  Drinks 1 beer a day 5-6 cigarettes a day No recreational drugs   ED Course: Given 1L NS bolus. CXR with chronic bronchitis.  Labs notable for increased troponin 556> 802, creatinine 8.31, bicarb 13 with arterial blood gas pH 7.191. UA with large hemoglobin, rare bacteria, 30 protein.  BNP elevated at 348.1.  Arterial blood gas with pH 7.191.  Hemoglobin 9.9, thrombocytopenia platelets 129.  Nephrology consulted. FPTS consulted for admission.  Review Of Systems: Per HPI. Difficult to obtain ROS.   Review of Systems   Patient Active Problem List   Diagnosis Date Noted  . Acute kidney injury (AKI) with acute tubular necrosis (ATN) ([place]) 11/11/2018  . Upper airway cough syndrome 03/31/2018  . DOE (dyspnea on exertion) 03/30/2018  . 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 Acute renal failure ([place]) 10/14/2017  . Pulmonary embolism and infarction ([place]) 10/14/2017  . Acute metabolic encephalopathy 72/62/0355  . Cardiac arrest ([place]) 10/14/2017  . Non-STEMI (non-ST elevated myocardial infarction) ([place])   . Pulmonary embolus, left ([place])   . Acute deep vein thrombosis (DVT) of distal end  of left lower extremity ([place])   . Acute deep vein thrombosis (DVT) of radial vein of right upper extremity ([place])   . Left hemiparesis ([place])   . Severe protein-calorie malnutrition ([place])   . Acute encephalopathy   . Elevated troponin   . Acute on chronic respiratory failure with hypoxia ([place])   . Cardiac arrest ([place])   . AKI (acute kidney injury) ([place])   . Acute respiratory failure with hypoxemia ([place])   . Respiratory failure ([place])   . Abdominal pain 09/04/2017  . PUD (peptic ulcer disease) 09/04/2017  . Mesenteric ischemia ([place]) 09/04/2017  . 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 Left arm numbness 12/14/2015  . Numbness 12/14/2015  . Bilateral carotid artery stenosis 11/22/2015  . Recurrent major depressive disorder in partial remission ([place]) 10/19/2015  . Mitral valve prolapse 09/07/2015  . Vitamin D deficiency 08/31/2015  . Arterial atherosclerosis 04/10/2015  . Cataract 04/10/2015  . Cerebrovascular accident, old 04/10/2015  . Gonalgia 04/10/2015  . HLD (hyperlipidemia) 04/10/2015  . 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 Renal artery stenosis ([place]) 04/10/2015  . History of non-ST elevation myocardial infarction (NSTEMI) 04/10/2015  . Anemia associated with acute blood loss 08/24/2014  . Thrombocytopenia ([place])   . Hypokalemia   . Essential hypertension 08/09/2014  . Chest pain 08/09/2014  . Near syncope 08/09/2014  . CKD (chronic kidney disease) stage 3, GFR 30-59 ml/min (HCC) 08/09/2014  . Tobacco abuse 08/09/2014  . Upper GI bleeding   . Gastric ulcer with hemorrhage   .  Acute blood loss anemia   . Cerebral infarction ([place]) 05/16/2013  . HTN (hypertension) 05/16/2013  . 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 Acute metabolic encephalopathy 10/16/9483  . Gastric ulcer   . Hyperlipemia   . Hypertension   . Hypospadias   . MI (myocardial infarction) ([place])   . Pulmonary  embolism and infarction ([place]) 10/14/2017  . Stroke ([place])   . Tobacco abuse     Past Surgical History: Past Surgical History:  Procedure Laterality Date  . APPLICATION OF WOUND VAC N/A 09/04/2017   Procedure: APPLICATION OF WOUND VAC and Exploration of Abdomen.;  Surgeon: Elam Dutch, MD;  Location: [place];  Service: Vascular;  Laterality: N/A;  . APPLICATION OF WOUND VAC N/A 09/06/2017   Procedure: APPLICATION OF WOUND VAC;  Surgeon: Georganna Skeans, MD;  Location: [place];  Service: General;  Laterality: N/A;  . APPLICATION OF WOUND VAC N/A 09/08/2017   Procedure: APPLICATION OF WOUND VAC;  Surgeon: Judeth Horn, MD;  Location: [place];  Service: General;  Laterality: N/A;  . APPLICATION OF WOUND VAC N/A 09/12/2017   Procedure: APPLICATION OF WOUND VAC  (CHANGE);  Surgeon: Judeth Horn, MD;  Location: [place];  Service: General;  Laterality: N/A;  . APPLICATION OF WOUND VAC N/A 09/14/2017   Procedure: APPLICATION OF WOUND VAC;  Surgeon: Greer Pickerel, MD;  Location: [place];  Service: General;  Laterality: N/A;  . APPLICATION OF WOUND VAC N/A 09/23/2017   Procedure: APPLICATION OF NEGATIVE PRESSURE THERAPY;  Surgeon: Kieth Brightly Arta Bruce, MD;  Location: [place];  Service: General;  Laterality: N/A;  . BYPASS GRAFT AORTA TO AORTA  09/04/2017   Procedure: Aorta to Superior Mesinteric aorta bypass ultrason Left common Femoral/;  Surgeon: Elam Dutch, MD;  Location[facility];  Service: Vascular;;  . ESOPHAGOGASTRODUODENOSCOPY N/A 08/09/2014   Procedure: ESOPHAGOGASTRODUODENOSCOPY (EGD);  Surgeon: Jerene Bears, MD;  [facility];  Service: Gastroenterology;  Laterality: N/A;  . ESOPHAGOGASTRODUODENOSCOPY N/A 08/10/2014   Procedure: ESOPHAGOGASTRODUODENOSCOPY (EGD);  Surgeon: Jerene Bears, MD;  Location: [facility];  Service: Gastroenterology;  Laterality: N/A;  . ILEOSTOMY Right 09/06/2017   Procedure: POSSIBLE ILEOSTOMY;  Surgeon: Georganna Skeans, MD;  Location: [place];  Service: General;   Laterality: Right;  . INSERTION OF MESH N/A 09/23/2017   Procedure: INSERTION OF MESH;  Surgeon: Kinsinger, Arta Bruce, MD;  Location: [place];  Service: General;  Laterality: N/A;  . IR FLUORO GUIDE CV LINE RIGHT  10/04/2017  . IR US GUIDE VASC ACCESS RIGHT  10/04/2017  . LAPAROTOMY Right 09/04/2017   Procedure: EXPLORATORY LAPAROTOMY Right Colon Resection;  Surgeon: Elam Dutch, MD;  Location: [place];  Service: Vascular;  Laterality: Right;  . LAPAROTOMY N/A 09/06/2017   Procedure: EXPLORATORY LAPAROTOMY;  Surgeon: Georganna Skeans, MD;  Location: [place];  Service: General;  Laterality: N/A;  . LAPAROTOMY N/A 09/08/2017   Procedure: EXPLORATORY LAPAROTOMY, ABDOMINAL WASHOUT, PARTIAL CLOSURE OF ABDOMEN;  Surgeon: Judeth Horn, MD;  Location: [place];  Service: General;  Laterality: N/A;  . LAPAROTOMY N/A 09/10/2017   Procedure: EXPLORATORY LAPAROTOMY WITH POSSIBLE WOUND CLOSURE ABDOMEN;  Surgeon: Judeth Horn, MD;  Location: [place];  Service: General;  Laterality: N/A;  . LAPAROTOMY N/A 09/12/2017   Procedure: EXPLORATORY LAPAROTOMY;  Surgeon: Judeth Horn, MD;  Location: [place];  Service: General;  Laterality: N/A;  . LAPAROTOMY N/A 09/14/2017   Procedure: EXPLORATORY LAPAROTOMY;  Surgeon: Redmond Pulling,  Randall Hiss, MD;  Location: [place];  Service: General;  Laterality: N/A;  . LAPAROTOMY N/A 09/17/2017   Procedure: EXPLORATORY LAPAROTOMY AND PARTIAL CLOSURE OF ABDOMEN;  Surgeon: Judeth Horn, MD;  Location: [place];  Service: General;  Laterality: N/A;  . LAPAROTOMY N/A 09/19/2017   Procedure: EXPLORATORY LAPAROTOMY & DRESSING CHANGE;  Surgeon: Coralie Keens, MD;  Location: [place];  Service: General;  Laterality: N/A;  . LAPAROTOMY N/A 09/23/2017   Procedure: EXPLORATORY LAPAROTOMY, INCISIONAL HERNIA REPAIR WITH MESH INSERTION, PARTIAL CLOSURE OF SKIN;  Surgeon: Kieth Brightly, Arta Bruce, MD;  Location: [place];  Service: General;  Laterality: N/A;  . TRACHEOSTOMY TUBE PLACEMENT N/A 09/12/2017   Procedure: TRACHEOSTOMY;   Surgeon: Judeth Horn, MD;  Location: [place];  Service: General;  Laterality: N/A;  . VISCERAL ANGIOGRAM N/A 09/04/2017   Procedure: Non Selective MESENTERIC ANGIOGRAM,;  Surgeon: Elam Dutch, MD;  [facility] OR;  Service: Vascular;  Laterality: N/A;    Social History: Social History   Tobacco Use  . Smoking status: Former Smoker    Packs/day: 2.00    Years: 50.00    Pack years: 100.00    Types: Cigarettes    Quit date: 08/20/2017    Years since quitting: 3.0  . Smokeless tobacco: Never Used  Vaping Use  . Vaping Use: Never used  Substance Use Topics  . Alcohol use: Yes    Alcohol/week: 0.0 standard drinks    Comment: occasional  . Drug use: No   Family History: Family History  Problem Relation Age of Onset  . Pancreatic cancer Mother   . Heart attack Father 77    Allergies and Medications: No Known Allergies No current facility-administered medications on file prior to encounter.   Current Outpatient Medications on File Prior to Encounter  Medication Sig Dispense Refill  . acetaminophen (TYLENOL) 325 MG tablet Place 2 tablets (650 mg total) into feeding tube every 6 (six) hours. (Patient taking differently: Take 650 mg by mouth every 6 (six) hours as needed for mild pain, fever or headache.) 10 tablet 0  . apixaban (ELIQUIS) 2.5 MG TABS tablet Take 2.5 mg by mouth 2 (two) times daily.    [name] ascorbic acid (VITAMIN C) 500 MG tablet Take 500 mg by mouth daily.    [name] atorvastatin (LIPITOR) 80 MG tablet Place 1 tablet (80 mg total) into feeding tube daily at 6 PM. (Patient taking differently: Take 80 mg by mouth daily at 6 PM.) 30 tablet 0  . Cholecalciferol (VITAMIN D3 ULTRA POTENCY) 1.25 MG (50000 UT) TABS Take 1.25 mg by mouth once a week. On Thursday's    . cholestyramine (QUESTRAN) 4 g packet Take 4 g by mouth daily.    . ergocalciferol (VITAMIN D2) 1.25 MG (50000 UT) capsule Take 50,000 Units by mouth once a week. On thursdays    . fluticasone (FLONASE) 50 MCG/ACT  nasal spray Place 1 spray into both nostrils daily.    [name] gabapentin (NEURONTIN) 100 MG capsule Take 100 mg by mouth daily.     [name] omeprazole (PRILOSEC) 20 MG capsule Take 20 mg by mouth daily.    . ondansetron (ZOFRAN) 4 MG tablet Take 4 mg by mouth every 8 (eight) hours as needed for nausea or vomiting.    . Probiotic Product (PROBIOTIC PO) Take 1 tablet by mouth daily.    . sertraline (ZOLOFT) 50 MG tablet Take 50 mg by mouth daily.    . sodium bicarbonate 650 MG tablet Take 1,300 mg by mouth  3 (three) times daily.    . sodium chloride 0.9 % SOLN 75 mLs by CRRT route See admin instructions. 75 ml/hr intravenously every shift for abdominal pain, nausea    . tamsulosin (FLOMAX) 0.4 MG CAPS capsule Take 0.4 mg by mouth daily.    . OXYGEN Place 2 L/min into the nose at bedtime. Nasal cannula      Objective: BP (!) 165/83 (BP Location: Right Arm)   Pulse 95   Temp 98.6 F (37 C) (Oral)   Resp (!) 21   Ht 5\' 8"  (1.727 m)   Wt 93.3 kg   SpO2 97%   BMI 31.26 kg/m  Exam: General: ill appearing, sleeping, NAD Eyes: EOMI.  ENTM: MMM Neck: Supple. Normal ROM Cardiovascular: RRR no murmurs Respiratory: Diffuse crackles bilaterally. Increased transmitted breath sounds. Increased WOB. Satting 97% on 3L [place]  Gastrointestinal: soft, non distended. Colostomy in place with liquid stool. Wound midline erythematous covered with wound dressing (see image below) MSK: Normal tone and ROM  Derm: warm, dry.  Neuro: alert and oriented x4 Psych: mood and affect appropriate       Labs and Imaging: CBC BMET  Recent Labs  Lab 08/30/20 1241  WBC 6.3  HGB 9.9*  HCT 31.2*  PLT 129*   Recent Labs  Lab 08/30/20 1241  NA 138  K 3.7  CL 111  CO2 13*  BUN 73*  CREATININE 8.31*  GLUCOSE 88  CALCIUM 7.6*     EKG: Sinus tachycardia. Premature atrial complexes   US RENAL Result Date: 08/30/2020 CLINICAL DATA:  Azotemia. EXAM: RENAL / URINARY TRACT ULTRASOUND COMPLETE COMPARISON:  Renal  ultrasound 11/09/2018.  Abdominal CT 10/26/2017 FINDINGS: Right Kidney: Renal measurements: 11.1 x 6.1 x 5.6 cm = volume: 198 mL. Mild increased renal parenchymal echogenicity. Mild thinning of the renal parenchyma. No hydronephrosis. No evidence of focal lesion or stone. Left Kidney: Renal measurements: 8.1 x 4.2 x 3.8 cm = volume: 68 mL. Left kidney is difficult to visualize. There is prominent thinning of the renal parenchyma with increased parenchymal echogenicity. No hydronephrosis. No evidence of focal lesion or stone. Bladder: Appears normal for degree of bladder distention. Other: None. IMPRESSION: 1. Chronic left renal atrophy and sequela of chronic renal disease. 2. Mild increased right renal parenchymal echogenicity consistent with chronic medical renal disease. No hydronephrosis. Electronically Signed   By: Kol Rake M.D.   On: 08/30/2020 17:50   DG Chest Port 1 View Result Date: 08/30/2020 CLINICAL DATA:  Shortness of breath EXAM: PORTABLE CHEST 1 VIEW COMPARISON:  November 11, 2018 FINDINGS: There is interstitial thickening without edema or consolidation. Heart is upper normal in size with pulmonary vascularity normal. No adenopathy. There is aortic atherosclerosis. No bone lesions. IMPRESSION: Interstitium is somewhat thickened, likely indicative of underlying chronic bronchitis. No edema or airspace opacity. Heart upper normal in size. Aortic Atherosclerosis (ICD10-I70.0). Electronically Signed   By: Lowella Grip III M.D.   On: 08/30/2020 13:01    Sonia Side, DO PGY-1, [place] Intern pager: [phone], text pages welcome  FPTS Upper-Level Resident Addendum   I have independently interviewed and examined the patient. I have discussed the above with the original author and agree with their documentation..Please see also any attending notes.   Gifford Shave, MD PGY-2, [place] [facility] 08/30/2020 7:26 PM  [place] Service pager: [phone] (text  pages welcome through [facility])

## 2020-08-31 ENCOUNTER — Observation Stay (HOSPITAL_COMMUNITY): Payer: Medicare Other

## 2020-08-31 ENCOUNTER — Encounter (HOSPITAL_COMMUNITY): Payer: Self-pay | Admitting: Family Medicine

## 2020-08-31 ENCOUNTER — Telehealth: Payer: Self-pay | Admitting: Family Medicine

## 2020-08-31 ENCOUNTER — Other Ambulatory Visit: Payer: Self-pay

## 2020-08-31 ENCOUNTER — Inpatient Hospital Stay (HOSPITAL_COMMUNITY): Payer: Medicare Other

## 2020-08-31 DIAGNOSIS — R112 Nausea with vomiting, unspecified: Secondary | ICD-10-CM | POA: Diagnosis not present

## 2020-08-31 DIAGNOSIS — Z7189 Other specified counseling: Secondary | ICD-10-CM | POA: Diagnosis not present

## 2020-08-31 DIAGNOSIS — E876 Hypokalemia: Secondary | ICD-10-CM | POA: Diagnosis present

## 2020-08-31 DIAGNOSIS — K8001 Calculus of gallbladder with acute cholecystitis with obstruction: Secondary | ICD-10-CM | POA: Diagnosis present

## 2020-08-31 DIAGNOSIS — R0609 Other forms of dyspnea: Secondary | ICD-10-CM

## 2020-08-31 DIAGNOSIS — I4901 Ventricular fibrillation: Secondary | ICD-10-CM | POA: Diagnosis present

## 2020-08-31 DIAGNOSIS — N184 Chronic kidney disease, stage 4 (severe): Secondary | ICD-10-CM | POA: Diagnosis present

## 2020-08-31 DIAGNOSIS — Z515 Encounter for palliative care: Secondary | ICD-10-CM

## 2020-08-31 DIAGNOSIS — Z20822 Contact with and (suspected) exposure to covid-19: Secondary | ICD-10-CM | POA: Diagnosis present

## 2020-08-31 DIAGNOSIS — Z66 Do not resuscitate: Secondary | ICD-10-CM | POA: Diagnosis not present

## 2020-08-31 DIAGNOSIS — D631 Anemia in chronic kidney disease: Secondary | ICD-10-CM | POA: Diagnosis present

## 2020-08-31 DIAGNOSIS — I2510 Atherosclerotic heart disease of native coronary artery without angina pectoris: Secondary | ICD-10-CM | POA: Diagnosis present

## 2020-08-31 DIAGNOSIS — N189 Chronic kidney disease, unspecified: Secondary | ICD-10-CM | POA: Diagnosis present

## 2020-08-31 DIAGNOSIS — J810 Acute pulmonary edema: Secondary | ICD-10-CM | POA: Diagnosis not present

## 2020-08-31 DIAGNOSIS — R0603 Acute respiratory distress: Secondary | ICD-10-CM | POA: Diagnosis not present

## 2020-08-31 DIAGNOSIS — E872 Acidosis: Secondary | ICD-10-CM | POA: Diagnosis present

## 2020-08-31 DIAGNOSIS — E785 Hyperlipidemia, unspecified: Secondary | ICD-10-CM | POA: Diagnosis present

## 2020-08-31 DIAGNOSIS — N179 Acute kidney failure, unspecified: Secondary | ICD-10-CM | POA: Diagnosis present

## 2020-08-31 DIAGNOSIS — J690 Pneumonitis due to inhalation of food and vomit: Secondary | ICD-10-CM | POA: Diagnosis present

## 2020-08-31 DIAGNOSIS — R7401 Elevation of levels of liver transaminase levels: Secondary | ICD-10-CM | POA: Diagnosis not present

## 2020-08-31 DIAGNOSIS — N170 Acute kidney failure with tubular necrosis: Secondary | ICD-10-CM | POA: Diagnosis not present

## 2020-08-31 DIAGNOSIS — R778 Other specified abnormalities of plasma proteins: Secondary | ICD-10-CM | POA: Diagnosis not present

## 2020-08-31 DIAGNOSIS — A419 Sepsis, unspecified organism: Secondary | ICD-10-CM | POA: Diagnosis present

## 2020-08-31 DIAGNOSIS — I248 Other forms of acute ischemic heart disease: Secondary | ICD-10-CM | POA: Diagnosis present

## 2020-08-31 DIAGNOSIS — I471 Supraventricular tachycardia: Secondary | ICD-10-CM | POA: Diagnosis present

## 2020-08-31 DIAGNOSIS — J9611 Chronic respiratory failure with hypoxia: Secondary | ICD-10-CM | POA: Diagnosis present

## 2020-08-31 DIAGNOSIS — N185 Chronic kidney disease, stage 5: Secondary | ICD-10-CM | POA: Diagnosis not present

## 2020-08-31 DIAGNOSIS — Z86718 Personal history of other venous thrombosis and embolism: Secondary | ICD-10-CM | POA: Diagnosis not present

## 2020-08-31 DIAGNOSIS — I4819 Other persistent atrial fibrillation: Secondary | ICD-10-CM | POA: Diagnosis present

## 2020-08-31 DIAGNOSIS — I69354 Hemiplegia and hemiparesis following cerebral infarction affecting left non-dominant side: Secondary | ICD-10-CM | POA: Diagnosis not present

## 2020-08-31 DIAGNOSIS — R0602 Shortness of breath: Secondary | ICD-10-CM | POA: Diagnosis present

## 2020-08-31 DIAGNOSIS — Z8674 Personal history of sudden cardiac arrest: Secondary | ICD-10-CM | POA: Diagnosis not present

## 2020-08-31 DIAGNOSIS — R1110 Vomiting, unspecified: Secondary | ICD-10-CM

## 2020-08-31 DIAGNOSIS — K810 Acute cholecystitis: Secondary | ICD-10-CM | POA: Diagnosis not present

## 2020-08-31 DIAGNOSIS — I129 Hypertensive chronic kidney disease with stage 1 through stage 4 chronic kidney disease, or unspecified chronic kidney disease: Secondary | ICD-10-CM | POA: Diagnosis present

## 2020-08-31 DIAGNOSIS — Z86711 Personal history of pulmonary embolism: Secondary | ICD-10-CM | POA: Diagnosis not present

## 2020-08-31 DIAGNOSIS — R0600 Dyspnea, unspecified: Secondary | ICD-10-CM | POA: Diagnosis not present

## 2020-08-31 DIAGNOSIS — J9621 Acute and chronic respiratory failure with hypoxia: Secondary | ICD-10-CM | POA: Diagnosis not present

## 2020-08-31 DIAGNOSIS — G8194 Hemiplegia, unspecified affecting left nondominant side: Secondary | ICD-10-CM | POA: Diagnosis not present

## 2020-08-31 DIAGNOSIS — R609 Edema, unspecified: Secondary | ICD-10-CM | POA: Diagnosis not present

## 2020-08-31 LAB — ECHOCARDIOGRAM LIMITED
AV Mean grad: 7 mmHg
AV Peak grad: 11.8 mmHg
Ao pk vel: 1.72 m/s
Area-P 1/2: 3.19 cm2
Calc EF: 46.5 %
Height: 68 in
MV M vel: 5.67 m/s
MV Peak grad: 128.6 mmHg
Single Plane A2C EF: 51.6 %
Single Plane A4C EF: 39.5 %
Weight: 3272 oz

## 2020-08-31 LAB — BLOOD GAS, ARTERIAL
Acid-base deficit: 9.4 mmol/L — ABNORMAL HIGH (ref 0.0–2.0)
Bicarbonate: 15.8 mmol/L — ABNORMAL LOW (ref 20.0–28.0)
Drawn by: 336832
FIO2: 36
O2 Saturation: 96.7 %
Patient temperature: 37
pCO2 arterial: 33.3 mmHg (ref 32.0–48.0)
pH, Arterial: 7.297 — ABNORMAL LOW (ref 7.350–7.450)
pO2, Arterial: 89.8 mmHg (ref 83.0–108.0)

## 2020-08-31 LAB — HEPATITIS PANEL, ACUTE
HCV Ab: NONREACTIVE
Hep A IgM: NONREACTIVE
Hep B C IgM: NONREACTIVE
Hepatitis B Surface Ag: NONREACTIVE

## 2020-08-31 LAB — RAPID URINE DRUG SCREEN, HOSP PERFORMED
Amphetamines: NOT DETECTED
Barbiturates: NOT DETECTED
Benzodiazepines: NOT DETECTED
Cocaine: NOT DETECTED
Opiates: POSITIVE — AB
Tetrahydrocannabinol: NOT DETECTED

## 2020-08-31 LAB — COMPREHENSIVE METABOLIC PANEL
ALT: 46 U/L — ABNORMAL HIGH (ref 0–44)
AST: 63 U/L — ABNORMAL HIGH (ref 15–41)
Albumin: 2.4 g/dL — ABNORMAL LOW (ref 3.5–5.0)
Alkaline Phosphatase: 200 U/L — ABNORMAL HIGH (ref 38–126)
Anion gap: 14 (ref 5–15)
BUN: 75 mg/dL — ABNORMAL HIGH (ref 8–23)
CO2: 17 mmol/L — ABNORMAL LOW (ref 22–32)
Calcium: 7.3 mg/dL — ABNORMAL LOW (ref 8.9–10.3)
Chloride: 107 mmol/L (ref 98–111)
Creatinine, Ser: 7.45 mg/dL — ABNORMAL HIGH (ref 0.61–1.24)
GFR, Estimated: 7 mL/min — ABNORMAL LOW (ref >60)
Glucose, Bld: 90 mg/dL (ref 70–99)
Potassium: 3.2 mmol/L — ABNORMAL LOW (ref 3.5–5.1)
Sodium: 138 mmol/L (ref 135–145)
Total Bilirubin: 1.6 mg/dL — ABNORMAL HIGH (ref 0.3–1.2)
Total Protein: 5.5 g/dL — ABNORMAL LOW (ref 6.5–8.1)

## 2020-08-31 LAB — HEPARIN LEVEL (UNFRACTIONATED)
Heparin Unfractionated: >1.1 IU/mL — ABNORMAL HIGH (ref 0.30–0.70)
Heparin Unfractionated: >1.1 IU/mL — ABNORMAL HIGH (ref 0.30–0.70)

## 2020-08-31 LAB — CBC WITH DIFFERENTIAL/PLATELET
Abs Immature Granulocytes: 0 10*3/uL (ref 0.00–0.07)
Basophils Absolute: 0 10*3/uL (ref 0.0–0.1)
Basophils Relative: 0 %
Eosinophils Absolute: 0 10*3/uL (ref 0.0–0.5)
Eosinophils Relative: 0 %
HCT: 30.6 % — ABNORMAL LOW (ref 39.0–52.0)
Hemoglobin: 10.4 g/dL — ABNORMAL LOW (ref 13.0–17.0)
Lymphocytes Relative: 0 %
Lymphs Abs: 0 10*3/uL — ABNORMAL LOW (ref 0.7–4.0)
MCH: 30.1 pg (ref 26.0–34.0)
MCHC: 34 g/dL (ref 30.0–36.0)
MCV: 88.7 fL (ref 80.0–100.0)
Monocytes Absolute: 0.1 10*3/uL (ref 0.1–1.0)
Monocytes Relative: 3 %
Neutro Abs: 4 10*3/uL (ref 1.7–7.7)
Neutrophils Relative %: 97 %
Platelets: 127 10*3/uL — ABNORMAL LOW (ref 150–400)
RBC: 3.45 MIL/uL — ABNORMAL LOW (ref 4.22–5.81)
RDW: 14.6 % (ref 11.5–15.5)
WBC: 4.1 10*3/uL (ref 4.0–10.5)
nRBC: 0 % (ref 0.0–0.2)
nRBC: 0 /100 WBC

## 2020-08-31 LAB — CBC
HCT: 30 % — ABNORMAL LOW (ref 39.0–52.0)
Hemoglobin: 9.8 g/dL — ABNORMAL LOW (ref 13.0–17.0)
MCH: 29.9 pg (ref 26.0–34.0)
MCHC: 32.7 g/dL (ref 30.0–36.0)
MCV: 91.5 fL (ref 80.0–100.0)
Platelets: 128 10*3/uL — ABNORMAL LOW (ref 150–400)
RBC: 3.28 MIL/uL — ABNORMAL LOW (ref 4.22–5.81)
RDW: 14.6 % (ref 11.5–15.5)
WBC: 5.6 10*3/uL (ref 4.0–10.5)
nRBC: 0 % (ref 0.0–0.2)

## 2020-08-31 LAB — MAGNESIUM: Magnesium: 1.4 mg/dL — ABNORMAL LOW (ref 1.7–2.4)

## 2020-08-31 LAB — APTT
aPTT: >200 seconds (ref 24–36)
aPTT: >200 seconds (ref 24–36)

## 2020-08-31 LAB — MRSA PCR SCREENING: MRSA by PCR: POSITIVE — AB

## 2020-08-31 LAB — URINE CULTURE: Culture: NO GROWTH

## 2020-08-31 LAB — TROPONIN I (HIGH SENSITIVITY): Troponin I (High Sensitivity): 827 ng/L (ref <18)

## 2020-08-31 LAB — PROCALCITONIN: Procalcitonin: 1.84 ng/mL

## 2020-08-31 LAB — HIV ANTIBODY (ROUTINE TESTING W REFLEX): HIV Screen 4th Generation wRfx: NONREACTIVE

## 2020-08-31 LAB — LIPASE, BLOOD: Lipase: 33 U/L (ref 11–51)

## 2020-08-31 LAB — LACTIC ACID, PLASMA: Lactic Acid, Venous: 1.6 mmol/L (ref 0.5–1.9)

## 2020-08-31 LAB — PHOSPHORUS: Phosphorus: 8 mg/dL — ABNORMAL HIGH (ref 2.5–4.6)

## 2020-08-31 MED ORDER — VANCOMYCIN HCL 2000 MG/400ML IV SOLN
2000.0000 mg | Freq: Once | INTRAVENOUS | Status: AC
  Administered 2020-08-31: 2000 mg via INTRAVENOUS
  Filled 2020-08-31: qty 400

## 2020-08-31 MED ORDER — MUPIROCIN 2 % EX OINT
1.0000 "application " | TOPICAL_OINTMENT | Freq: Two times a day (BID) | CUTANEOUS | Status: AC
  Administered 2020-08-31 – 2020-09-04 (×9): 1 via NASAL
  Filled 2020-08-31 (×3): qty 22

## 2020-08-31 MED ORDER — POTASSIUM CHLORIDE CRYS ER 20 MEQ PO TBCR
40.0000 meq | EXTENDED_RELEASE_TABLET | Freq: Once | ORAL | Status: AC
  Administered 2020-08-31: 40 meq via ORAL
  Filled 2020-08-31: qty 2

## 2020-08-31 MED ORDER — CHLORHEXIDINE GLUCONATE CLOTH 2 % EX PADS
6.0000 | MEDICATED_PAD | Freq: Every day | CUTANEOUS | Status: DC
  Administered 2020-08-31 – 2020-09-02 (×3): 6 via TOPICAL

## 2020-08-31 MED ORDER — HEPARIN (PORCINE) 25000 UT/250ML-% IV SOLN
900.0000 [IU]/h | INTRAVENOUS | Status: DC
  Administered 2020-08-31: 800 [IU]/h via INTRAVENOUS
  Administered 2020-09-02: 700 [IU]/h via INTRAVENOUS
  Filled 2020-08-31 (×3): qty 250

## 2020-08-31 MED ORDER — MAGNESIUM SULFATE 2 GM/50ML IV SOLN
2.0000 g | Freq: Once | INTRAVENOUS | Status: AC
  Administered 2020-08-31: 2 g via INTRAVENOUS
  Filled 2020-08-31: qty 50

## 2020-08-31 MED ORDER — HEPARIN (PORCINE) 25000 UT/250ML-% IV SOLN
1100.0000 [IU]/h | INTRAVENOUS | Status: DC
  Administered 2020-08-31: 1100 [IU]/h via INTRAVENOUS
  Filled 2020-08-31: qty 250

## 2020-08-31 MED ORDER — SODIUM CHLORIDE 0.9 % IV SOLN
2.0000 g | INTRAVENOUS | Status: DC
  Administered 2020-08-31 – 2020-09-04 (×5): 2 g via INTRAVENOUS
  Filled 2020-08-31 (×5): qty 2

## 2020-08-31 MED ORDER — VANCOMYCIN VARIABLE DOSE PER UNSTABLE RENAL FUNCTION (PHARMACIST DOSING): Status: DC

## 2020-08-31 MED ORDER — PANTOPRAZOLE SODIUM 40 MG PO TBEC
40.0000 mg | DELAYED_RELEASE_TABLET | Freq: Every day | ORAL | Status: DC
  Administered 2020-08-31 – 2020-09-04 (×5): 40 mg via ORAL
  Filled 2020-08-31 (×5): qty 1

## 2020-08-31 NOTE — Progress Notes (Signed)
FPTS Interim Progress Note  Patient seen again at bedside at his request for re-eval.  He was sleeping on arrival with continued increased WOB on [place], but appears unchanged from prior exam.  He easily arouses to verbal stimuli, states that he feels about the same, but not worse.  He would like to not make any changes currently and stay the course.  He remains tachycardic, on bedside monitor appears sinus tachycardia.  He would like to be left alone to rest the remainder of the evening and was requesting water to drink.  He condition remains guarded.  Should the patient significantly decompensate overnight, his wishes are clear that he would like to go comfort care and go back to his LTAC as soon as possible.  Currently, we will continue his current treatment for sepsis likely 2/2 aspiration pneumonia and acute on chronic renal failure.  O: BP 119/65 (BP Location: Right Arm)   Pulse (!) 129   Temp 98.6 F (37 C) (Oral)   Resp (!) 26   Ht 5\' 8"  (1.727 m)   Wt 92.8 kg   SpO2 93%   BMI 31.09 kg/m     Cleophas Dunker, DO 08/31/2020, 9:46 PM PGY-3, [place] Service pager [phone]

## 2020-08-31 NOTE — Progress Notes (Signed)
Pt bladder scanned measuring 94 cc of urine. Provider Dr. Carollee Leitz  notified and stated to hold order of indwelling foley catheter.

## 2020-08-31 NOTE — Progress Notes (Addendum)
Family Medicine Teaching Service Daily Progress Note Intern Pager: [phone]  Patient name: Luke Kaufman Medical record number: [id number] Date of birth: July 15, 1950 Age: 70 y.o. Gender: male  Primary Care Provider: Rosaria Ferries, MD Consultants: Nephrology Code Status: Full  Pt Overview and Major Events to Date:  5/11- patient admittted  Assessment and Plan:  [name] is 70 yo male that presented with increasing weakness and SOB and admitted for Acute Renal Failure/Metabolic Acidosis in the setting of CKD Stage III. 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   Acute Renal Failure Metabolic Acidosis, improving Lab show that acidosis is improving, ABG pH 7.29, pCO2 33, pO2 89, HCO3 15.8 Scr is 7.45, and trending down from 7.83. Baseline Creatinine 2-3. He is currently receiving continuous Sodium Bicarb 170ml/hr. Etiology of acute renal failure is still unclear, no signs of sepsis, hypovolemia, or iatrogenic kidney injury. Given that his troponins were elevated at presentation, ischemia/ hypoperfusion is possible. He also has electrolyte disturbances with low mag, low K+ and hyperphosphotemia. Total intake 1.24L and UOP was ~85cc with condom cathether bag this morning. Nephrology is following, and recommendations are appreciated. We will also get an abdominal u/s and Hepatits panel/LFTs to workup other possible etiologies for metabolic acidosis.  --Nephrology following, recs appreciated. --Continue Sodium Bicarbonate 11mEq --Echocardiogram pending --consider electrolyte replacement  --Strict I/Os  Pulmonary Edema? Exam is significant for course breath sounds and this morning's CXR is significant for new bibasilar opacities. Patient is receiving IVF of sodium bicarbonate 160mL/hr. Total intake 1.24L --consider adding Lasix 40mg  --bladder scan --foley  catether --start BiPAP  Anemia of Chronic Disease, stable  Hbg 9.8 this morning. Transfusion threshold is 8. We will continue to monitor --AM CBC  Atrial Fibrillation History of A.Fib on Eliquis. Tachycardic this morning, HR . Tele-monitoring showed irregular rhythm. We will continue to monitor and possibly consult Cardiology.  --continue Tele-monitoring  Elevated Troponins, improved Troponins [phone]. Patient denies any pain, EKG shows sinus tachycardia, with irregular rhythm. Stopped trending.  --See above  Hypertension BP was 135/77. Not taking any home medications. We will continue to monitor, and avoid hypoperfusion in setting on acute renal failure and CKD.  --monitor vitals  Hx of DVT and Pulmonary Embolism Home medications include Eliquis. Eliquis held at admission due to acute renal failure and instead anticoagulated with Heparin. 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 We placed a WOC consult --ostomy care  Hx. V. Fib Cardiac Arrest V.Fib arrest in 2019.  --tele-monitoring  Hyperlipidemia Taking atovostatin 80mg  at home and continued at admission --continue Atorvastatin 80mg  --continue cholestyramine 4g  Depression Home medications include Sertaline --continue Sertaline 50mg     FEN/GI: Heart Healthy, thin fluid  PPx: POProtonix 40mg     Status is: Observation  The patient will require care spanning > 2 midnights and should be moved to inpatient because: Inpatient level of care appropriate due to severity of illness  Dispo: The patient is from: SNF              Anticipated d/c is to: SNF              Patient currently is not medically stable to d/c.   Difficult to place patient No  Subjective:  [name] reports that his SOB is unchanged, and that he is not  an any pain this morning. He reports that he had not noticed any urinary output last night. He did have 2 episodes of emesis, 1 last night and 1 this morning but denies any nausea or abdominal pain.   Objective: Temp:  [98 F (36.7 C)-99.4 F (37.4 C)] 99.4 F (37.4 C) (05/12 0829) Pulse Rate:  [90-114] 111 (05/12 0829) Resp:  [16-35] 35 (05/12 0829) BP: (102-173)/(60-101) 160/61 (05/12 0829) SpO2:  [95 %-100 %] 98 % (05/12 0829) Weight:  [92.8 kg-93.3 kg] 92.8 kg (05/11 2038) Physical Exam: General: Patient in bed, tachypneic , increased WOB  Cardiovascular: Tachycardia present, no murmurs auscultated  Respiratory: SpO2 97-98% on 3L [place]. Suprasternal tugging, Coarse breath sounds bilaterally.  Abdomen: Midline wound covered with gauze with sanguinous discharge. Normoactive bowel sounds, soft non-distended, non-tender to palpation. No CVA tenderness, no suprapubic tenderness Extremities: Dry, cracked skin on lower extremities b/l.  Mild, non-pitting edema.  Psych: A&Ox3. Possible visual hallucinations, asked for people in hallway to leave when hallway was empty.   Laboratory: Recent Labs  Lab 08/30/20 1231 08/30/20 1241 08/31/20 0645  WBC  --  6.3 5.6  HGB 9.9* 9.9* 9.8*  HCT 29.0* 31.2* 30.0*  PLT  --  129* 128*   Recent Labs  Lab 08/30/20 1241 08/30/20 2207 08/31/20 0645  NA 138 139 138  K 3.7 3.8 3.2*  CL 111 111 107  CO2 13* 12* 17*  BUN 73* 75* 75*  CREATININE 8.31* 7.83* 7.45*  CALCIUM 7.6* 7.4* 7.3*  PROT  --   --  5.5*  BILITOT  --   --  1.6*  ALKPHOS  --   --  200*  ALT  --   --  46*  AST  --   --  63*  GLUCOSE 88 80 90   ABG    Component Value Date/Time   PHART 7.297 (L) 08/31/2020 0813   PCO2ART 33.3 08/31/2020 0813   PO2ART 89.8 08/31/2020 0813   HCO3 15.8 (L) 08/31/2020 0813   TCO2 15 (L) 08/30/2020 1231   ACIDBASEDEF 9.4 (H) 08/31/2020 0813   O2SAT 96.7 08/31/2020 0813     Imaging/Diagnostic Tests:  Chest X-Ray 08/31/20  FINDINGS: Heart  is normal size. Bibasilar opacities, new since prior study. No effusions or pneumothorax. Mild atherosclerotic calcifications. No acute bony abnormality.  IMPRESSION: New bibasilar atelectasis or infiltrates.  Jacklynn Bue, Medical Student 08/31/2020, 7:39 AM MS4, [place] Intern pager: [phone], text pages welcome   FPTS Upper-Level Resident Addendum   I have independently interviewed and examined the patient. I have discussed the above with the original author and agree with their documentation. Please see also any attending notes.   In addition to the above note   FPTS Interim Progress Note  S:Went to assess patient as was reported watcher status overnight.  He reports that he is not feeling too good.  Has had some nausea after eating with emesis.  He endorses that his breathing is not good.  Feels more short of breath with difficulty breathing.  Denies any chest pain or abdominal pain.    O: BP (!) 160/61 (BP Location: Right Arm)   Pulse (!) 111   Temp 99.4 F (37.4 C) (Oral)   Resp (!) 35   Ht 5\' 8"  (1.727 m)   Wt 92.8 kg   SpO2 98%   BMI 31.09 kg/m  Physical Exam:   General: 70 y.o. male in acute respiratory distress, arouses to voice Cardio: Tachycardic, no murmurs Lungs: Coarse crackles throughout lung fields,  IWOB on 3L oxygen and abdominal breathing.  Unable to complete full sentences during conversation.  Abdomen: Soft, round, non-tender to palpation, non-distended, positive bowel sounds.  Right colostomy bag insitu. Midline abdominal abdominal wound dressing dry and intact with old sang drainage.   Skin: warm and dry Extremities: Trace edema  A/P:  Acute Hypoxic Resp Distress/Low urine output Baseline oxygen 2L, now on 3L n/c.  Respirations shallow and 35 rpm.  Coarse crackles on lung exam.  No documented urine output overnight.  Bladder scan 94 cc.  Renal u/s negative for hydronephrosis.  Last ECHO shows LVEF 60-65% with  worsening MVR..  Did have episode of vomiting last night.  Differentials include worsening CHF and Aspiration PNA.  Less likely PE given currently on therapeurtic Heparin drip. -ABG shows some improvement in pH and HCO3 -Chest xray shows new bibasilar atelectasis/infiltrates -Spoke with Nephrology for recommendations given elevated Cr.   -Consider decreasing fluids, possible gentle diuresing today -Trial BiPAP today -ECHO pending -Replete Mag/K per Nephrology   Carollee Leitz, MD 08/31/2020, 8:49 AM PGY-2, [place] Medicine Service pager [phone]

## 2020-08-31 NOTE — Consult Note (Addendum)
Consultation Note Date: 08/31/2020   Patient Name: Luke Kaufman  DOB: 1951/03/13  MRN: [id number]  Age / Sex: 70 y.o., male  PCP: Rosaria Ferries, MD Referring Physician: Zenia Resides, MD  Reason for Consultation: Establishing goals of care  HPI/Patient Profile: 70 y.o. 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 Diagnosed with acute renal failure - initial creatinine 8.31. Creatinine trending down. Some concern for pulmonary edema and respiratory failure. Also with a fib RVR. Procalcitonin found to be 1.84. PMT consulted to discuss [place].   Clinical Assessment and Goals of Care: I have reviewed medical records including EPIC notes, labs and imaging, received report from FMTS, assessed the patient and then met with patient to discuss diagnosis prognosis, GOC, EOL wishes, disposition and options.  I introduced Palliative Medicine as specialized medical care for people living with serious illness. It focuses on providing relief from the symptoms and stress of a serious illness. The goal is to improve quality of life for both the patient and the family.  We discussed a brief life review of the patient. He tells me he has lived in [place] since significant hospitalization in 2019.   As far as functional and nutritional status, he tells me he was able to walk with a walker at [facility]. He did require assistance with bathing and toileting. He reports a good appetite prior to current illness.   When discussing next of kin/surrogate decision makers he tells me "I have no one" - his son, mother, and sister have all  passed away. There are no friends or caregivers he would like to list as surrogate decision makers. He does briefly mention a cousin - Luke Kaufman - though he does not know her contact information.    We discussed patient's current illness and what it means in the larger context of patient's on-going co-morbidities.  Natural disease trajectory and expectations at EOL were discussed. He understands renal failure, respiratory failure, and a fib. He has no questions about his medical status.   I attempted to elicit values and goals of care important to the patient.  He is very clear he would never want HD - he tells me of his experience taking a friend to HD and this experience is what brought him to this decision.   Encouraged patient to consider DNR/DNI status understanding evidenced based poor outcomes in similar hospitalized patients, as the cause of the arrest is likely associated with chronic/terminal disease rather than a reversible acute cardio-pulmonary event. He understands and agrees with DNR/DNI - tells me he would prefer "nature take it's course". Confirms he would not want CPR or intubation. Tells me if he were to worsen he would agree to full comfort measures.   Patient does tell me if he is dying he would prefer to return to [place] to die where he knows people. He is agreeable to staying here for a trial of medical treatments for now.  Discussed with patient the importance of continued conversation with family and the medical providers regarding overall plan of care and treatment options, ensuring decisions are within the context of the patient's values and GOCs.    Questions and concerns were  addressed. The family was encouraged to call with questions or concerns.   Primary Decision Maker PATIENT  no next of kin/surrogate decision maker  SUMMARY OF RECOMMENDATIONS   - code status changed to dnr/dni - patient clear he does not want HD - patient agreeable to continuing  current measures but if he worsens he would like to transition to full comfort measures - he would prefer to die at his LTC facility instead of hospital/he is hopeful to transfer back prior to death but is agreeable to stay here for now for trial of treatments - will reevaluate 5/13 - would recommend dilaudid if needed for dyspnea/avoid morphine in renal failure - no surrogate decision maker/next of kin  Code Status/Advance Care Planning:  DNR     Primary Diagnoses: Present on Admission: . AKI (acute kidney injury) ([place])   I have reviewed the medical record, interviewed the patient and family, and examined the patient. The following aspects are pertinent.  Past Medical History:  Diagnosis Date  . Acute metabolic encephalopathy 5/00/9381  . Gastric ulcer   . Hyperlipemia   . Hypertension   . Hypospadias   . MI (myocardial infarction) ([place])   . Pulmonary embolism and infarction ([place]) 10/14/2017  . Stroke ([place])   . Tobacco abuse    Social History   Socioeconomic History  . Marital status: Single    Spouse name: Not on file  . Number of children: 1  . Years of education: Not on file  . Highest education level: Not on file  Occupational History  . Not on file  Tobacco Use  . Smoking status: Former Smoker    Packs/day: 2.00    Years: 50.00    Pack years: 100.00    Types: Cigarettes    Quit date: 08/20/2017    Years since quitting: 3.0  . Smokeless tobacco: Never Used  Vaping Use  . Vaping Use: Never used  Substance and Sexual Activity  . Alcohol use: Yes    Alcohol/week: 0.0 standard drinks    Comment: occasional  . Drug use: No  . Sexual activity: Not on file  Other Topics Concern  . Not on file  Social History Narrative   Retired from [employer].     Social Determinants of Health   Financial Resource Strain: Not on file  Food Insecurity: Not on file  Transportation Needs: Not on file  Physical Activity: Not on file  Stress: Not on file   Social Connections: Not on file   Family History  Problem Relation Age of Onset  . Pancreatic cancer Mother   . Heart attack Father 73   Scheduled Meds: . atorvastatin  80 mg Oral q1800  . Chlorhexidine Gluconate Cloth  6 each Topical Daily  . cholestyramine  4 g Oral Daily  . pantoprazole  40 mg Oral Daily  . sertraline  50 mg Oral Daily  . vancomycin variable dose per unstable renal function (pharmacist dosing)   Does not apply See admin instructions   Continuous Infusions: . ceFEPime (MAXIPIME) IV    . heparin 1,100 Units/hr (08/31/20 1018)  . magnesium sulfate bolus IVPB 2 g (08/31/20 1249)  .  sodium bicarbonate (isotonic) infusion in sterile water 75 mL/hr at 08/31/20 1247  . vancomycin     PRN Meds:. No Known Allergies Review of Systems  Constitutional: Positive for activity change and fatigue.  Neurological: Positive for tremors and weakness.    Physical Exam Constitutional:      Comments: Able  to converse but closes eyes when not stimulated  Cardiovascular:     Rate and Rhythm: Tachycardia present. Rhythm irregular.  Pulmonary:     Comments: Appears short of breath but denies sensation of SOB, breathing appears slightly labored Musculoskeletal:     Right lower leg: Edema present.     Left lower leg: Edema present.  Skin:    General: Skin is warm and dry.  Neurological:     Mental Status: He is oriented to person, place, and time.     Vital Signs: BP (!) 148/124   Pulse (!) 143   Temp 99 F (37.2 C) (Oral)   Resp 20   Ht _0  (1.727 m)   Wt 92.8 kg   SpO2 95%   BMI 31.09 kg/m  Pain Scale: 0-10   Pain Score: 0-No pain   SpO2: SpO2: 95 % O2 Device:SpO2: 95 % O2 Flow Rate: .O2 Flow Rate (L/min): 3 L/min  IO: Intake/output summary:   Intake/Output Summary (Last 24 hours) at 08/31/2020 1347 Last data filed at 08/31/2020 1302 Gross per 24 hour  Intake 1240 ml  Output 402 ml  Net 838 ml    LBM:   Baseline Weight: Weight: 93.3 kg Most  recent weight: Weight: 92.8 kg     Palliative Assessment/Data: PPS 20%    Time Total: 50 minutes Greater than 50%  of this time was spent counseling and coordinating care related to the above assessment and plan.  Juel Burrow, DNP, AGNP-C Palliative Medicine Team [phone] Pager: [phone]

## 2020-08-31 NOTE — Progress Notes (Signed)
   08/31/20 1234  Assess: MEWS Score  BP (!) 82/58  Pulse Rate (!) 121  ECG Heart Rate (!) 136  Resp (!) 25  SpO2 91 %  Assess: MEWS Score  MEWS Temp 2  MEWS Systolic 1  MEWS Pulse 3  MEWS RR 1  MEWS LOC 0  MEWS Score 7  MEWS Score Color Red  Take Vital Signs  Increase Vital Sign Frequency  Red: Q 1hr X 4 then Q 4hr X 4, if remains red, continue Q 4hrs  Escalate  MEWS: Escalate Red: discuss with charge nurse/RN and provider, consider discussing with RRT  Notify: Charge Nurse/RN  Name of Charge Nurse/RN Notified Liana,RN  Date Charge Nurse/RN Notified 08/31/20  Time Charge Nurse/RN Notified 9604  Notify: Provider  Provider Name/Title Alcus Dad  Date Provider Notified 08/31/20  Time Provider Notified 1245  Notification Type Page  Notification Reason Change in status  Provider response At bedside  Date of Provider Response 08/31/20  Time of Provider Response 1300

## 2020-08-31 NOTE — Consult Note (Signed)
[place] Nurse Consult Note: Patient receiving care in [facility] [phone] Reason for Consult: Abdominal wound and ostomy Wound type: Surgical abdominal wound Pressure Injury POA: NA Measurement: 5 cm x 10 cm x 0.1 cm Wound bed: Pink friable Drainage (amount, consistency, odor) Bloody Periwound: Pink scar tissue Dressing procedure/placement/frequency: Gently clean with NS, pat dry. Apply Xeroform gauze Luke Kaufman # [phone]), secure with ABD pad and tape with medipore. Change daily  Monitor the wound area(s) for worsening of condition such as: Signs/symptoms of infection, increase in size, development of or worsening of odor, development of pain, or increased pain at the affected locations.   Notify the medical team if any of these develop.  [name] Nurse ostomy consult note Patient admitted with ileostomy that he states he has had for 3 years.  Stoma type/location: RLQ ileostomy Stomal assessment/size: 1" pink moist and budded Peristomal assessment: minor crusting from six to eight o'clock.   Output: mushy brown Ostomy pouching: 2pc. 2 1/4" Luke Kaufman # [phone]) Skin Barrier Luke Kaufman # [phone]) Barrier ring Luke Kaufman # [phone]) Education provided: None, not a new ostomy Enrolled patient in [name] program: No  WOC team will not follow this patient. Bedside nurses to change abd dressing daily and colostomy pouch on Tues and Thurs Re-consult if needed.  2 piece pouching system     [name], MSN, RN, [name], [name], [name] Wound Treatment Associate Pager [phone]

## 2020-08-31 NOTE — Progress Notes (Signed)
*  PRELIMINARY RESULTS* Echocardiogram 2D Echocardiogram has been performed.  Luisa Hart RDCS 08/31/2020, 8:11 AM

## 2020-08-31 NOTE — Progress Notes (Signed)
PT Cancellation Note  Patient Details Name: Luke Kaufman MRN: [id number] DOB: 1951-02-27   Cancelled Treatment:    Reason Eval/Treat Not Completed: Medical issues which prohibited therapy Per RN patient being transferred to higher level of care. OT will hold for now and f/u as able.   Lyanne Co, DPT Acute Rehabilitation Services [phone]   [place] 08/31/2020, 9:59 AM

## 2020-08-31 NOTE — Progress Notes (Signed)
FPTS Interim Progress Note  S: Received call from RN that patients HR 120-160's, BP 94/75 and febrile to 100.7.  Went to assess patient. He remains drowsy and responds to voice.  Denies any chest pain or worsening shortness of breath.  Intermittently falls asleep during exam.  Discussed with patient [place] and introduced idea of talking to Palliative care.  [place] agreeable to meeting with team.  O: BP 97/62 (BP Location: Left Arm)   Pulse (!) 143   Temp 99.5 F (37.5 C) (Oral)   Resp 20   Ht 5\' 8"  (1.727 m)   Wt 92.8 kg   SpO2 95%   BMI 31.09 kg/m    Physical Exam:  General: 70 y.o. male in no acute distress Cardio: Tachycardic, regular rhythm.  No murmurs Lungs: Course crackles throughout lung fields, abdominal breathing with  IWOB on 3 L N/C  A/P: Patient now meets Sepsis criteria.  Blood cultures from 05/11 NGTD.  Urine culture shows no growth.  Given that patient is now febrile and recent changes in chest xray, likely infectious source.   Will continue to assess and if BP continues to decline will bolus IVF.  Less likely PE given no increase in O2 requirement and currently on therapeutic Heparin dosing. -Maintain MAP >65 -Maintain O2 sats >88% -Procal 1.87 -Repeat Lactic acid -ECG shows ST with PACs, Replete electrolytes -CBC now -MSRS PCR  -Pharm consult for Vanc and Cefepime, will deescalate as appropriate -Continue NHCO3 138mEq at 75/hr per Nephro -Insert foley catheter and monitor output per Nephrology.  Will likely discontinue 24-48 hrs -Palliative consult- per discussion patient now DNR and wishes to go back to SNF if health continues to decline  Carollee Leitz, MD 08/31/2020, 1:11 PM PGY-2, [employer] Medicine Service pager [phone]

## 2020-08-31 NOTE — Telephone Encounter (Signed)
Created in error

## 2020-08-31 NOTE — Progress Notes (Signed)
   08/31/20 1234  Assess: MEWS Score  BP (!) 82/58  Pulse Rate (!) 121  ECG Heart Rate (!) 136  Resp (!) 25  SpO2 91 %  Assess: MEWS Score  MEWS Temp 2  MEWS Systolic 1  MEWS Pulse 3  MEWS RR 1  MEWS LOC 0  MEWS Score 7  MEWS Score Color Red  Take Vital Signs  Increase Vital Sign Frequency  Red: Q 1hr X 4 then Q 4hr X 4, if remains red, continue Q 4hrs  Escalate  MEWS: Escalate Red: discuss with charge nurse/RN and provider, consider discussing with RRT  Notify: Charge Nurse/RN  Name of Charge Nurse/RN Notified Liana,RN  Date Charge Nurse/RN Notified 08/31/20  Time Charge Nurse/RN Notified 3403  Notify: Provider  Provider Name/Title Alcus Dad  Date Provider Notified 08/31/20  Time Provider Notified 1245  Notification Type Page  Notification Reason Change in status  Provider response At bedside  Date of Provider Response 08/31/20  Time of Provider Response 1300  Notify: Rapid Response  Name of Rapid Response RN Notified Hella,RN  Date Rapid Response Notified 08/31/20  Time Rapid Response Notified 5248  Document  Patient Outcome Stabilized after interventions

## 2020-08-31 NOTE — Progress Notes (Signed)
OT Cancellation Note  Patient Details Name: Luke Kaufman MRN: [id number] DOB: Aug 27, 1950   Cancelled Treatment:    Reason Eval/Treat Not Completed: Medical issues which prohibited therapy. Per RN patient being transferred to higher level of care. OT will hold for now and f/u as able.   [name] [name] 08/31/2020, 8:31 AM

## 2020-08-31 NOTE — Progress Notes (Signed)
ANTICOAGULATION CONSULT NOTE  Pharmacy Consult for heparin Indication: hx of DVT and PE, will use therapeutic heparin for now  No Known Allergies  Patient Measurements: Height: 5\' 8"  (172.7 cm) Weight: 92.8 kg (204 lb 8 oz) IBW/kg (Calculated) : 68.4 Heparin Dosing Weight: 87 KG   Vital Signs: Temp: 98.6 F (37 C) (05/12 2039) Temp Source: Oral (05/12 2039) BP: 119/65 (05/12 2039) Pulse Rate: 111 (05/12 2039)  Labs: Recent Labs    08/30/20 1241 08/30/20 1425 08/30/20 2117 08/30/20 2207 08/30/20 2318 08/31/20 0645 08/31/20 1347 08/31/20 1851  HGB 9.9*  --   --   --   --  9.8* 10.4*  --   HCT 31.2*  --   --   --   --  30.0* 30.6*  --   PLT 129*  --   --   --   --  128* 127*  --   APTT  --   --   --   --   --  >200*  --  >200*  HEPARINUNFRC  --   --   --   --   --  >1.10*  --  >1.10*  CREATININE 8.31*  --   --  7.83*  --  7.45*  --   --   CKTOTAL  --   --  1,150*  --   --   --   --   --   TROPONINIHS 556* 802* 869*  --  827*  --   --   --    Estimated Creatinine Clearance: 10.2 mL/min (A) (by C-G formula based on SCr of 7.45 mg/dL (H)).  Medical History: Past Medical History:  Diagnosis Date  . Acute metabolic encephalopathy 8/52/7782  . Gastric ulcer   . Hyperlipemia   . Hypertension   . Hypospadias   . MI (myocardial infarction) ([place])   . Pulmonary embolism and infarction ([place]) 10/14/2017  . Stroke ([place])   . Tobacco abuse    Medications:  Apixaban 2.5 MG BID (last dose 5/11 0900 per nursing home admin)   Assessment: 31 yom from SNF with increased weakness and SOB. Patient has a PMH significant for CAD/MI in 2015, V. Fib arrest and A. Fib 2019, PVD, CVA, HTN, PE, and O2 at home during the night. Patient is on apixaban 2.5 MG BID with last known dose at 0900 on 5/11. On admit, CBC with Hgb 9.9, Plt 129. Scr>8 (not interested in dialysis in the past).   Heparin level and aPTT both remain significantly elevated. Appears the lab was drawn correctly.  Goal of  Therapy:  Heparin level 0.3-0.7 units/ml Monitor platelets by anticoagulation protocol: Yes   Plan:  -Hold heparin x1h -Restart at 800 units/h -Check 8hr heparin level and aPTT   Arrie Senate, PharmD, BCPS, [facility] Clinical Pharmacist [phone] Please check AMION for all [facility] Pharmacy numbers 08/31/2020

## 2020-08-31 NOTE — Progress Notes (Addendum)
FPTS Interim Progress Note Patient evaluated at bedside for the evening given his watcher status. Patient resting comfortably in bed and was easy to awake. He stated to be transferred back to his LTC facility if his condition worsens and for that would like be re-evaluated in an hour.  Provided with reassurance and plan is to continue the current care.  Honor Junes, MD 08/31/2020, 8:20 PM PGY-1, [place] Medicine Service pager [phone]

## 2020-08-31 NOTE — Progress Notes (Addendum)
Luke Kaufman Admit Date: 08/30/2020 08/31/2020 [name]  Nephrology Progress Note   Subjective:   Patient reports improved respiratory status from admission. Denies presence of pain. He is still pretty somnolent.  UOP not recorded-  Unclear if had any -  Is getting IVF at 125 per hour- BUN and crt actually reading as a little better from admit   Creatinine, Ser (mg/dL)  Date Value  08/31/2020 7.45 (H)  08/30/2020 7.83 (H)  08/30/2020 8.31 (H)  08/17/2020 2.82 (H)  11/25/2018 3.34 (H)  11/24/2018 3.39 (H)  11/23/2018 3.36 (H)  11/22/2018 3.45 (H)  11/21/2018 3.70 (H)  11/20/2018 4.21 (H)   I/Os:  Intake/Output Summary (Last 24 hours) at 08/31/2020 1201 Last data filed at 08/31/2020 0701 Gross per 24 hour  Intake 1240 ml  Output 2 ml  Net 1238 ml    PMH  Past Medical History:  Diagnosis Date  . Acute metabolic encephalopathy 08/03/2438  . Gastric ulcer   . Hyperlipemia   . Hypertension   . Hypospadias   . MI (myocardial infarction) ([place])   . Pulmonary embolism and infarction ([place]) 10/14/2017  . Stroke ([place])   . Tobacco abuse    PSH  Past Surgical History:  Procedure Laterality Date  . APPLICATION OF WOUND VAC N/A 09/04/2017   Procedure: APPLICATION OF WOUND VAC and Exploration of Abdomen.;  Surgeon: Elam Dutch, MD;  Location: [place];  Service: Vascular;  Laterality: N/A;  . APPLICATION OF WOUND VAC N/A 09/06/2017   Procedure: APPLICATION OF WOUND VAC;  Surgeon: Georganna Skeans, MD;  Location: [place];  Service: General;  Laterality: N/A;  . APPLICATION OF WOUND VAC N/A 09/08/2017   Procedure: APPLICATION OF WOUND VAC;  Surgeon: Judeth Horn, MD;  Location: [place];  Service: General;  Laterality: N/A;  . APPLICATION OF WOUND VAC N/A 09/12/2017   Procedure: APPLICATION OF WOUND VAC  (CHANGE);  Surgeon: Judeth Horn, MD;  Location: [place];  Service: General;  Laterality: N/A;  . APPLICATION OF WOUND VAC N/A 09/14/2017   Procedure: APPLICATION OF WOUND  VAC;  Surgeon: Greer Pickerel, MD;  Location: [place];  Service: General;  Laterality: N/A;  . APPLICATION OF WOUND VAC N/A 09/23/2017   Procedure: APPLICATION OF NEGATIVE PRESSURE THERAPY;  Surgeon: Kieth Brightly Arta Bruce, MD;  Location: [place];  Service: General;  Laterality: N/A;  . BYPASS GRAFT AORTA TO AORTA  09/04/2017   Procedure: Aorta to Superior Mesinteric aorta bypass ultrason Left common Femoral/;  Surgeon: Elam Dutch, MD;  Location: [facility] OR;  Service: Vascular;;  . ESOPHAGOGASTRODUODENOSCOPY N/A 08/09/2014   Procedure: ESOPHAGOGASTRODUODENOSCOPY (EGD);  Surgeon: Jerene Bears, MD;  Location[facility];  Service: Gastroenterology;  Laterality: N/A;  . ESOPHAGOGASTRODUODENOSCOPY N/A 08/10/2014   Procedure: ESOPHAGOGASTRODUODENOSCOPY (EGD);  Surgeon: Jerene Bears, MD;  Location: [facility];  Service: Gastroenterology;  Laterality: N/A;  . ILEOSTOMY Right 09/06/2017   Procedure: POSSIBLE ILEOSTOMY;  Surgeon: Georganna Skeans, MD;  Location: [place];  Service: General;  Laterality: Right;  . INSERTION OF MESH N/A 09/23/2017   Procedure: INSERTION OF MESH;  Surgeon: Kinsinger, Arta Bruce, MD;  Location: [place];  Service: General;  Laterality: N/A;  . IR FLUORO GUIDE CV LINE RIGHT  10/04/2017  . IR US GUIDE VASC ACCESS RIGHT  10/04/2017  . LAPAROTOMY Right 09/04/2017   Procedure: EXPLORATORY LAPAROTOMY Right Colon Resection;  Surgeon: Elam Dutch, MD;  Location: [place];  Service: Vascular;  Laterality: Right;  . LAPAROTOMY N/A 09/06/2017  Procedure: EXPLORATORY LAPAROTOMY;  Surgeon: Georganna Skeans, MD;  Location: [place];  Service: General;  Laterality: N/A;  . LAPAROTOMY N/A 09/08/2017   Procedure: EXPLORATORY LAPAROTOMY, ABDOMINAL WASHOUT, PARTIAL CLOSURE OF ABDOMEN;  Surgeon: Judeth Horn, MD;  Location: [place];  Service: General;  Laterality: N/A;  . LAPAROTOMY N/A 09/10/2017   Procedure: EXPLORATORY LAPAROTOMY WITH POSSIBLE WOUND CLOSURE ABDOMEN;  Surgeon: Judeth Horn, MD;  Location: [place];   Service: General;  Laterality: N/A;  . LAPAROTOMY N/A 09/12/2017   Procedure: EXPLORATORY LAPAROTOMY;  Surgeon: Judeth Horn, MD;  Location: [place];  Service: General;  Laterality: N/A;  . LAPAROTOMY N/A 09/14/2017   Procedure: EXPLORATORY LAPAROTOMY;  Surgeon: Greer Pickerel, MD;  Location: [place];  Service: General;  Laterality: N/A;  . LAPAROTOMY N/A 09/17/2017   Procedure: EXPLORATORY LAPAROTOMY AND PARTIAL CLOSURE OF ABDOMEN;  Surgeon: Judeth Horn, MD;  Location: [place];  Service: General;  Laterality: N/A;  . LAPAROTOMY N/A 09/19/2017   Procedure: EXPLORATORY LAPAROTOMY & DRESSING CHANGE;  Surgeon: Coralie Keens, MD;  Location: [place];  Service: General;  Laterality: N/A;  . LAPAROTOMY N/A 09/23/2017   Procedure: EXPLORATORY LAPAROTOMY, INCISIONAL HERNIA REPAIR WITH MESH INSERTION, PARTIAL CLOSURE OF SKIN;  Surgeon: Kieth Brightly, Arta Bruce, MD;  Location: [place];  Service: General;  Laterality: N/A;  . TRACHEOSTOMY TUBE PLACEMENT N/A 09/12/2017   Procedure: TRACHEOSTOMY;  Surgeon: Judeth Horn, MD;  Location: [place];  Service: General;  Laterality: N/A;  . VISCERAL ANGIOGRAM N/A 09/04/2017   Procedure: Non Selective MESENTERIC ANGIOGRAM,;  Surgeon: Elam Dutch, MD;  Location: [facility] OR;  Service: Vascular;  Laterality: N/A;   FH  Family History  Problem Relation Age of Onset  . Pancreatic cancer Mother   . Heart attack Father 32   Luke Kaufman  reports that he quit smoking about 3 years ago. His smoking use included cigarettes. He has a 100.00 pack-year smoking history. He has never used smokeless tobacco. He reports current alcohol use. He reports that he does not use drugs.  Allergies No Known Allergies  Home medications Prior to Admission medications   Medication Sig Start Date End Date Taking? Authorizing Provider  acetaminophen (TYLENOL) 325 MG tablet Place 2 tablets (650 mg total) into feeding tube every 6 (six) hours. Patient taking differently: Take 650 mg by mouth every 6 (six) hours as  needed for mild pain, fever or headache. 10/09/17  Yes Allie Bossier, MD  apixaban (ELIQUIS) 2.5 MG TABS tablet Take 2.5 mg by mouth 2 (two) times daily.   Yes [provider]  ascorbic acid (VITAMIN C) 500 MG tablet Take 500 mg by mouth daily.   Yes [provider]  atorvastatin (LIPITOR) 80 MG tablet Place 1 tablet (80 mg total) into feeding tube daily at 6 PM. Patient taking differently: Take 80 mg by mouth daily at 6 PM. 10/09/17  Yes Allie Bossier, MD  Cholecalciferol (VITAMIN D3 ULTRA POTENCY) 1.25 MG (50000 UT) TABS Take 1.25 mg by mouth once a week. On Thursday's   Yes [provider]  cholestyramine (QUESTRAN) 4 g packet Take 4 g by mouth daily.   Yes [provider]  ergocalciferol (VITAMIN D2) 1.25 MG (50000 UT) capsule Take 50,000 Units by mouth once a week. On thursdays   Yes [provider]  fluticasone (FLONASE) 50 MCG/ACT nasal spray Place 1 spray into both nostrils daily.   Yes [provider]  gabapentin (NEURONTIN) 100 MG capsule Take 100 mg by mouth daily.  Yes [provider]  omeprazole (PRILOSEC) 20 MG capsule Take 20 mg by mouth daily.   Yes [provider]  ondansetron (ZOFRAN) 4 MG tablet Take 4 mg by mouth every 8 (eight) hours as needed for nausea or vomiting.   Yes [provider]  Probiotic Product (PROBIOTIC PO) Take 1 tablet by mouth daily.   Yes [provider]  sertraline (ZOLOFT) 50 MG tablet Take 50 mg by mouth daily. 08/01/20  Yes [provider]  sodium bicarbonate 650 MG tablet Take 1,300 mg by mouth 3 (three) times daily.   Yes [provider]  sodium chloride 0.9 % SOLN 75 mLs by CRRT route See admin instructions. 75 ml/hr intravenously every shift for abdominal pain, nausea   Yes [provider]  tamsulosin (FLOMAX) 0.4 MG CAPS capsule Take 0.4 mg by mouth daily.   Yes [provider]  OXYGEN Place 2 L/min into the nose at bedtime.  Nasal cannula    [provider]    Current Medications Scheduled Meds: . atorvastatin  80 mg Oral q1800  . cholestyramine  4 g Oral Daily  . pantoprazole  40 mg Oral Daily  . sertraline  50 mg Oral Daily   Continuous Infusions: . heparin 1,100 Units/hr (08/31/20 1018)  .  sodium bicarbonate (isotonic) infusion in sterile water 125 mL/hr at 08/31/20 1200   PRN Meds:.  CBC Recent Labs  Lab 08/30/20 1231 08/30/20 1241 08/31/20 0645  WBC  --  6.3 5.6  NEUTROABS  --  5.5  --   HGB 9.9* 9.9* 9.8*  HCT 29.0* 31.2* 30.0*  MCV  --  94.8 91.5  PLT  --  129* 914*   Basic Metabolic Panel Recent Labs  Lab 08/30/20 1231 08/30/20 1241 08/30/20 2207 08/31/20 0645  NA 139 138 139 138  K 3.7 3.7 3.8 3.2*  CL  --  111 111 107  CO2  --  13* 12* 17*  GLUCOSE  --  88 80 90  BUN  --  73* 75* 75*  CREATININE  --  8.31* 7.83* 7.45*  CALCIUM  --  7.6* 7.4* 7.3*  PHOS  --   --   --  8.0*    Physical Exam  Blood pressure (!) 157/119, pulse (!) 107, temperature 100 F (37.8 C), temperature source Oral, resp. rate 18, height 5\' 8"  (1.727 m), weight 92.8 kg, SpO2 98 %.  GEN: elderly male, slightly improved and more alert appearing today  CV: irregular rhythm, radial pulses palpable  PULM: 3L [place], oxygen sats 92% while at bedside, decreased WOB compared to initial exam on 5/11   ABD: leakage through abdominal wound bandage, no abdominal tenderness, BS present  SKIN: dry skin on bilateral LEs, improved from admission  EXT: pitting edema in BLEs extending up to thighs   Assessment Luke Kaufman is a 70 y.o. male with improving metabolic acidosis and acute renal failure, with slightly increased urine output this AM and decreasing creatinine.   Plan 1. Acute Renal Failure: Creatinine improved from 8.3 to 7.4 overnight with bicarbonate based IVFs, remains with no acute indications for HD at this time, renal ultrasound with chronic renal disease changes, no hydronephrosis, left  kidney atrophy  1. Daily monitoring of creatinine 2. Strict I/Os 3. Continue IVFs  But decrease to 26ml/hr from 132ml/hr 4. Foley cathether ordered on admission, patient currently with condom cathether and some urinary output on exam, will leave for now  5. Pt has made it clear  that he is not interested in chronic dialysis  2. Metabolic Acidosis: Na bicarb in sterile water at 47mL/hr  3. Chronic Respiratory failure: improved ABG this AM, on baseline supplemental oxygen 3 L, will monitor for respiratory status, decreased IVF rate as above  4. HTN: BP remains elevated likely 2/2 to some increased volume, will monitor, expect this will normalize with improved renal function and as we decrease fluid volume  5. AF: continue Eliquis  6. Elevated Troponin: reached plateau overnight and now downtrending 7. Anemia: Hgb stable, monitor 8. Hypokalemia: K low at 3.2 this AM, will give K 80mEqx1 dose  9. Hypomagnesemia: 2g IV mag  Makiera [facility]  08/31/2020, 12:01 PM   Patient seen and examined, agree with above note with above modifications. Pt with A on crf in 13 days-  crt from 2.7 to 8.3.  Urine is fairly bland-  No obstruction on u/s.  Suspect somewhere along the line a hemodynamic insult that gave him ATN.  Supportive care with fluids-  Pt does not desire dialysis.  Will dec rate of fluids but continue them.  Try to continue to maximize his support and hope that we continue to see improvement in renal function  Corliss Parish, MD 08/31/2020

## 2020-08-31 NOTE — Progress Notes (Addendum)
FPTS Interim Progress Note  Paged that patient had one episode of emesis and while this occurred, went into A fib.  HR max 114.  Patient has a history of A fib, currently on heparin gtt while inpatient.  He is now asymptomatic, without abdominal or other pain.  HR sustaining in low 110s.  Will continue to monitor this, for now, would not give rate control as prior to vomiting, he had good rates, suspect will improve.  Heparin consult was placed for Hx DVT, PE, not for A fib, so verified with pharmacy that this dose would also cover for A fib, which it does.  If emesis continues, could consider further work up, but for now, will continue to treat for Acute on Chronic Renal Failure.  Will continue to monitor the patient closely.    EKG from time reviewed, sinus tachycardia with rate 106.  No significant change from last tracing.    Troponins have downtrended, will stop trending.   [place], DO 08/31/2020, 12:27 AM PGY-3, [place] Medicine Service pager [phone]

## 2020-08-31 NOTE — Progress Notes (Addendum)
Pharmacy Antibiotic Note  Luke Kaufman is a 70 y.o. male admitted on 08/30/2020 with acute renal failure now concerned for sepsis.  Pharmacy has been consulted for vancomycin and cefepime dosing.  HR 120-160's, BP 94/75, new fever (TM 101.7), WBC 4.1. Pt is drowsy, responds to voice. Denies chest pain or worsening SOB. Scr trending down 8.31 > 7.83 > 7.45. CrCl 10.2 ml/min. CKD stage 5 declining HD and now is DNR/DNI.  Plan: Vancomycin 2g IV x1 dose Cefepime 2g IV q24h - Monitor renal func for need to adjust abx. - f/u vanc random 5/14 AM for additional dosing  - Monitor clinical improvement, LOT, de-escalation as appropriate   Height: 5\' 8"  (172.7 cm) Weight: 92.8 kg (204 lb 8 oz) IBW/kg (Calculated) : 68.4  Temp (24hrs), Avg:99.2 F (37.3 C), Min:98 F (36.7 C), Max:101.7 F (38.7 C)  Recent Labs  Lab 08/30/20 1241 08/30/20 1249 08/30/20 2207 08/31/20 0645 08/31/20 1347  WBC 6.3  --   --  5.6 4.1  CREATININE 8.31*  --  7.83* 7.45*  --   LATICACIDVEN  --  0.7  --   --   --     Estimated Creatinine Clearance: 10.2 mL/min (A) (by C-G formula based on SCr of 7.45 mg/dL (H)).    No Known Allergies  Antimicrobials this admission: Cefepime 5/12 >>  Vanc 5/12 >>   Dose adjustments this admission:   Microbiology results: 5/12 BCx: NGTD 5/12 UCx: negative  Thank you for allowing pharmacy to be a part of this patient's care.  Debria Garret, Pharmacy Student 08/31/2020 2:55 PM

## 2020-08-31 NOTE — Progress Notes (Signed)
[place] for heparin Indication: hx of DVT and PE, will use therapeutic heparin for now  No Known Allergies  Patient Measurements: Height: 5\' 8"  (172.7 cm) Weight: 92.8 kg (204 lb 8 oz) IBW/kg (Calculated) : 68.4 Heparin Dosing Weight: 87 KG   Vital Signs: Temp: 98.4 F (36.9 C) (05/12 0200) Temp Source: Oral (05/12 0200) BP: 102/63 (05/12 0200) Pulse Rate: 92 (05/12 0200)  Labs: Recent Labs    08/30/20 1231 08/30/20 1241 08/30/20 1241 08/30/20 1425 08/30/20 2117 08/30/20 2207 08/30/20 2318  HGB 9.9* 9.9*  --   --   --   --   --   HCT 29.0* 31.2*  --   --   --   --   --   PLT  --  129*  --   --   --   --   --   CREATININE  --  8.31*  --   --   --  7.83*  --   CKTOTAL  --   --   --   --  1,150*  --   --   TROPONINIHS  --  556*   < > 802* 869*  --  827*   < > = values in this interval not displayed.   Estimated Creatinine Clearance: 9.7 mL/min (A) (by C-G formula based on SCr of 7.83 mg/dL (H)).  Medical History: Past Medical History:  Diagnosis Date  . Acute metabolic encephalopathy 08/30/2583  . Gastric ulcer   . Hyperlipemia   . Hypertension   . Hypospadias   . MI (myocardial infarction) ([place])   . Pulmonary embolism and infarction ([place]) 10/14/2017  . Stroke ([place])   . Tobacco abuse    Medications:  Apixaban 2.5 MG BID (last dose 5/11 0900 per nursing home admin)   Assessment: 84 yom from SNF with increased weakness and SOB. Patient has a PMH significant for CAD/MI in 2015, V. Fib arrest and A. Fib 2019, PVD, CVA, HTN, PE, and O2 at home during the night. Patient is on apixaban 2.5 MG BID with last known dose at 0900 on 5/11. On admit, CBC with Hgb 9.9, Plt 129. Scr>8 (not interested in dialysis in the past).   Today, heparin supreatherapeutic (aPTT >200, HL >1.10) on 1400 units/hr. CBC down slightly with Hgb 9.8 and Plt 128. No bleed or issues with line infusing. Confirmed with RN that levels not drawn from same arm as  heparin infusion.   Goal of Therapy:  Heparin level 0.3-0.7 units/ml Monitor platelets by anticoagulation protocol: Yes   Plan:  Hold Heparin x1 hour, then decrease rate to 1100 units/hr - F/u 8-hour heparin level and aPTT  - Trend aPTT and HL until correlated - Monitor for s/sx of bleeding    Claudina Lick, PharmD PGY1 Acute Care Pharmacy Resident 08/31/2020 6:14 AM  Please check [email or web address] for unit-specific pharmacy phone numbers.

## 2020-09-01 ENCOUNTER — Inpatient Hospital Stay (HOSPITAL_COMMUNITY): Payer: Medicare Other

## 2020-09-01 DIAGNOSIS — N184 Chronic kidney disease, stage 4 (severe): Secondary | ICD-10-CM

## 2020-09-01 DIAGNOSIS — R0600 Dyspnea, unspecified: Secondary | ICD-10-CM | POA: Diagnosis not present

## 2020-09-01 DIAGNOSIS — E872 Acidosis: Secondary | ICD-10-CM

## 2020-09-01 DIAGNOSIS — E876 Hypokalemia: Secondary | ICD-10-CM

## 2020-09-01 DIAGNOSIS — I4819 Other persistent atrial fibrillation: Secondary | ICD-10-CM

## 2020-09-01 DIAGNOSIS — N179 Acute kidney failure, unspecified: Secondary | ICD-10-CM | POA: Diagnosis not present

## 2020-09-01 DIAGNOSIS — R7401 Elevation of levels of liver transaminase levels: Secondary | ICD-10-CM | POA: Diagnosis not present

## 2020-09-01 LAB — COMPREHENSIVE METABOLIC PANEL
ALT: 37 U/L (ref 0–44)
AST: 56 U/L — ABNORMAL HIGH (ref 15–41)
Albumin: 1.9 g/dL — ABNORMAL LOW (ref 3.5–5.0)
Alkaline Phosphatase: 131 U/L — ABNORMAL HIGH (ref 38–126)
Anion gap: 17 — ABNORMAL HIGH (ref 5–15)
BUN: 81 mg/dL — ABNORMAL HIGH (ref 8–23)
CO2: 18 mmol/L — ABNORMAL LOW (ref 22–32)
Calcium: 6.9 mg/dL — ABNORMAL LOW (ref 8.9–10.3)
Chloride: 100 mmol/L (ref 98–111)
Creatinine, Ser: 7.14 mg/dL — ABNORMAL HIGH (ref 0.61–1.24)
GFR, Estimated: 8 mL/min — ABNORMAL LOW (ref >60)
Glucose, Bld: 102 mg/dL — ABNORMAL HIGH (ref 70–99)
Potassium: 2.9 mmol/L — ABNORMAL LOW (ref 3.5–5.1)
Sodium: 135 mmol/L (ref 135–145)
Total Bilirubin: 1.2 mg/dL (ref 0.3–1.2)
Total Protein: 5.2 g/dL — ABNORMAL LOW (ref 6.5–8.1)

## 2020-09-01 LAB — CBC
HCT: 28.1 % — ABNORMAL LOW (ref 39.0–52.0)
Hemoglobin: 9.5 g/dL — ABNORMAL LOW (ref 13.0–17.0)
MCH: 30.1 pg (ref 26.0–34.0)
MCHC: 33.8 g/dL (ref 30.0–36.0)
MCV: 88.9 fL (ref 80.0–100.0)
Platelets: 125 10*3/uL — ABNORMAL LOW (ref 150–400)
RBC: 3.16 MIL/uL — ABNORMAL LOW (ref 4.22–5.81)
RDW: 14.8 % (ref 11.5–15.5)
WBC: 8.4 10*3/uL (ref 4.0–10.5)
nRBC: 0 % (ref 0.0–0.2)

## 2020-09-01 LAB — BASIC METABOLIC PANEL
Anion gap: 15 (ref 5–15)
BUN: 84 mg/dL — ABNORMAL HIGH (ref 8–23)
CO2: 17 mmol/L — ABNORMAL LOW (ref 22–32)
Calcium: 7 mg/dL — ABNORMAL LOW (ref 8.9–10.3)
Chloride: 99 mmol/L (ref 98–111)
Creatinine, Ser: 6.76 mg/dL — ABNORMAL HIGH (ref 0.61–1.24)
GFR, Estimated: 8 mL/min — ABNORMAL LOW (ref >60)
Glucose, Bld: 128 mg/dL — ABNORMAL HIGH (ref 70–99)
Potassium: 3.2 mmol/L — ABNORMAL LOW (ref 3.5–5.1)
Sodium: 131 mmol/L — ABNORMAL LOW (ref 135–145)

## 2020-09-01 LAB — HEPARIN LEVEL (UNFRACTIONATED)
Heparin Unfractionated: 0.77 IU/mL — ABNORMAL HIGH (ref 0.30–0.70)
Heparin Unfractionated: 0.52 IU/mL (ref 0.30–0.70)

## 2020-09-01 LAB — RAPID URINE DRUG SCREEN, HOSP PERFORMED
Amphetamines: NOT DETECTED
Barbiturates: NOT DETECTED
Benzodiazepines: NOT DETECTED
Cocaine: NOT DETECTED
Opiates: POSITIVE — AB
Tetrahydrocannabinol: NOT DETECTED

## 2020-09-01 LAB — MAGNESIUM: Magnesium: 1.6 mg/dL — ABNORMAL LOW (ref 1.7–2.4)

## 2020-09-01 LAB — LEGIONELLA PNEUMOPHILA SEROGP 1 UR AG: L. pneumophila Serogp 1 Ur Ag: NEGATIVE

## 2020-09-01 LAB — APTT
aPTT: 165 seconds (ref 24–36)
aPTT: 85 seconds — ABNORMAL HIGH (ref 24–36)

## 2020-09-01 LAB — PHOSPHORUS: Phosphorus: 6.3 mg/dL — ABNORMAL HIGH (ref 2.5–4.6)

## 2020-09-01 LAB — CK: Total CK: 1175 U/L — ABNORMAL HIGH (ref 49–397)

## 2020-09-01 MED ORDER — TECHNETIUM TC 99M MEBROFENIN IV KIT
5.0000 | PACK | Freq: Once | INTRAVENOUS | Status: AC | PRN
  Administered 2020-09-01: 5 via INTRAVENOUS

## 2020-09-01 MED ORDER — METRONIDAZOLE 500 MG/100ML IV SOLN
500.0000 mg | Freq: Three times a day (TID) | INTRAVENOUS | Status: DC
  Administered 2020-09-01 – 2020-09-04 (×11): 500 mg via INTRAVENOUS
  Filled 2020-09-01 (×11): qty 100

## 2020-09-01 MED ORDER — MAGNESIUM OXIDE -MG SUPPLEMENT 400 (240 MG) MG PO TABS
200.0000 mg | ORAL_TABLET | Freq: Every day | ORAL | Status: AC
  Administered 2020-09-01 – 2020-09-03 (×3): 200 mg via ORAL
  Filled 2020-09-01 (×3): qty 1

## 2020-09-01 MED ORDER — POTASSIUM CHLORIDE 10 MEQ/100ML IV SOLN
10.0000 meq | INTRAVENOUS | Status: AC
  Administered 2020-09-01 (×4): 10 meq via INTRAVENOUS
  Filled 2020-09-01 (×4): qty 100

## 2020-09-01 MED ORDER — METOPROLOL TARTRATE 12.5 MG HALF TABLET
12.5000 mg | ORAL_TABLET | Freq: Two times a day (BID) | ORAL | Status: DC
  Administered 2020-09-01 – 2020-09-04 (×6): 12.5 mg via ORAL
  Filled 2020-09-01 (×7): qty 1

## 2020-09-01 MED ORDER — POTASSIUM CHLORIDE CRYS ER 20 MEQ PO TBCR
40.0000 meq | EXTENDED_RELEASE_TABLET | ORAL | Status: AC
  Administered 2020-09-01 – 2020-09-02 (×2): 40 meq via ORAL
  Filled 2020-09-01 (×3): qty 2

## 2020-09-01 NOTE — Progress Notes (Signed)
[place] for IV Heparin Indication: hx of DVT and PE, will use therapeutic heparin for now  No Known Allergies  Patient Measurements: Height: 5\' 8"  (172.7 cm) Weight: 97.6 kg (215 lb 3.2 oz) IBW/kg (Calculated) : 68.4 Heparin Dosing Weight: 87.7 kg   Vital Signs: Temp: 98.9 F (37.2 C) (05/13 1659) Temp Source: Oral (05/13 1659) BP: 126/76 (05/13 1659) Pulse Rate: 107 (05/13 1659)  Labs: Recent Labs    08/30/20 1241 08/30/20 1425 08/30/20 2117 08/30/20 2207 08/30/20 2318 08/31/20 0645 08/31/20 1347 08/31/20 1851 09/01/20 0518 09/01/20 0844 09/01/20 1827  HGB  --   --   --   --   --  9.8* 10.4*  --  9.5*  --   --   HCT  --   --   --   --   --  30.0* 30.6*  --  28.1*  --   --   PLT  --   --   --   --   --  128* 127*  --  125*  --   --   APTT   < >  --   --   --   --  >200*  --  >200* 165*  --  85*  HEPARINUNFRC   < >  --   --   --   --  >1.10*  --  >1.10* 0.77*  --  0.52  CREATININE  --   --   --    < >  --  7.45*  --   --   --  7.14* 6.76*  CKTOTAL  --   --  1,150*  --   --   --   --   --   --   --  1,175*  TROPONINIHS  --  802* 869*  --  827*  --   --   --   --   --   --    < > = values in this interval not displayed.   Estimated Creatinine Clearance: 11.5 mL/min (A) (by C-G formula based on SCr of 6.76 mg/dL (H)).  Medical History: Past Medical History:  Diagnosis Date  . Acute metabolic encephalopathy 2/44/0102  . Gastric ulcer   . Hyperlipemia   . Hypertension   . Hypospadias   . MI (myocardial infarction) ([place])   . Pulmonary embolism and infarction ([place]) 10/14/2017  . Stroke ([place])   . Tobacco abuse    Medications:  Apixaban 2.5 MG BID (last dose 5/11 0900 per nursing home admin)   Assessment: 70 yr old man from SNF with increased weakness and SOB. Patient has a PMH significant for CAD/MI in 2015, V. Fib arrest and A. Fib 2019, PVD, CVA, HTN, PE, and O2 at home during the night. Patient was on apixaban 2.5 MG BID  PTA, with last known dose at 0900 on 5/11. On admit, CBC with Hgb 9.9, Plt 129. Scr>8 (not interested in dialysis in the past).   aPTT and heparin level ~11.5 hrs after heparin infusion reduced to 700 units/hr were both within goal range at 85 sec and 0.52 units/ml, respectively. Appears effects of apixaban on heparin level have worn off, so will utilize heparin levels from now on for monitoring. H/H 9.5/28.1, plt 125. Per RN, no issues with IV or bleeding observed.  Goal of Therapy:  Heparin level 0.3-0.7 units/ml Monitor platelets by anticoagulation protocol: Yes   Plan:  Continue heparin infusion at 700 units/hr Check confirmatory heparin  level with AM labs tomorrow Monitor daily heparin level, CBC Monitor for bleeding  Gillermina Hu, PharmD, BCPS, [facility] Clinical Pharmacist 09/01/2020 8:19 PM

## 2020-09-01 NOTE — Evaluation (Signed)
Physical Therapy Evaluation Patient Details Name: Luke Kaufman MRN: [id number] DOB: 16-Apr-1951 Today's Date: 09/01/2020   History of Present Illness  70yo male admitted 08/30/20 with c/o increasing weakness and SOB. Admitted with acute on chronic renal failure, metabolic acidosis. 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 Took PLOF and functional history, however as we had our discussion, I noted that his HR progressively increased from about 115BPM to 162BPM in A-fib pattern although SpO2 remained stable between 90-93% on 2LPM O2. HR returned to 110BPM once he stopped talking to me. Deferred aggressive mobility due to HR and breathing pattern- do not feel that he could have safely tolerated mobility today. Performed MMT at bed level only during which vitals remained stable, however this was limited as transport tech arrived to take him for additional testing. Certainly recommend return to SNF once medically ready.     Follow Up Recommendations SNF;Supervision/Assistance - 24 hour    Equipment Recommendations  Hospital bed;Wheelchair (measurements PT);Wheelchair cushion (measurements PT);Other (comment) (hoyer lift and appropriate pads)    Recommendations for Other Services       Precautions / Restrictions Precautions Precautions: Fall;Other (comment) Precaution Comments: non-healing abdominal wound, ostomy, L hemi (UE>>LE), watch sats and HR Restrictions Weight Bearing Restrictions: No      Mobility  Bed Mobility               General bed mobility comments: deferred- HR and significant WOB    Transfers                 General transfer comment: deferred- HR and significant WOB  Ambulation/Gait             General  Gait Details: deferred- HR and significant WOB  Stairs            Wheelchair Mobility    Modified Rankin (Stroke Patients Only)       Balance                                             Pertinent Vitals/Pain Pain Assessment: Faces Faces Pain Scale: Hurts a little bit Pain Location: generalized discomfort with breathing Pain Descriptors / Indicators: Discomfort Pain Intervention(s): Limited activity within patient's tolerance;Monitored during session    Home Living Family/patient expects to be discharged to:: Skilled nursing facility                 Additional Comments: reports he was able to walk/move around and transfer no issues at [facility]- able to transfer to the [facility] and mobilized on a wheelchair level    Prior Function Level of [place]: Needs assistance   Gait / Transfers Assistance Needed: uses WC for mobility, able to transfer without assist at baseline  ADL's / Homemaking Assistance Needed: needed MinA from staff at SNF for bathing and dressing        Hand Dominance        Extremity/Trunk Assessment   Upper Extremity Assessment Upper Extremity Assessment: RUE deficits/detail;LUE deficits/detail RUE Deficits / Details: generally 4 to 4+/5 globally with bed level MMT RUE Sensation: WNL RUE Coordination: WNL LUE Deficits / Details:  shoulder flexion 1/5, elbow flexion and extension 3+/5, able to flex DIP joints of left hand but unable to make a fist (very limited ROM in DIP and PIP joints) LUE Sensation: decreased light touch;decreased proprioception LUE Coordination: decreased gross motor;decreased fine motor    Lower Extremity Assessment Lower Extremity Assessment: RLE deficits/detail;LLE deficits/detail RLE Deficits / Details: 4 to 4+/5 globally RLE Coordination: WNL LLE Deficits / Details: 4- to 4/5 globally LLE Coordination: WNL    Cervical / Trunk Assessment Cervical / Trunk Assessment: Kyphotic   Communication   Communication: Expressive difficulties;HOH  Cognition Arousal/Alertness: Awake/alert Behavior During Therapy: WFL for tasks assessed/performed;Flat affect Overall Cognitive Status: Impaired/Different from baseline Area of Impairment: Orientation;Safety/judgement;Awareness                 Orientation Level: Disoriented to;Time       Safety/Judgement: Decreased awareness of deficits Awareness: Emergent   General Comments: able to tell me he is in a hospital but not the city, but when asked the month and year, he only says may, then responds "22" when I ask him what city this is      General Comments General comments (skin integrity, edema, etc.): unable to get EOB to assess balance at eval- significant WOB at rest and HR progressively elevating to as much as 162BPM just speaking to therapist    Exercises     Assessment/Plan    PT Assessment Patient needs continued PT services  PT Problem List Decreased strength;Decreased cognition;Decreased activity tolerance;Decreased safety awareness;Decreased balance;Decreased mobility;Cardiopulmonary status limiting activity;Decreased coordination       PT Treatment Interventions DME instruction;Balance training;Gait training;Cognitive remediation;Functional mobility training;Patient/family education;Therapeutic activities;Therapeutic exercise;Wheelchair mobility training    PT Goals (Current goals can be found in the Care Plan section)  Acute Rehab PT Goals Patient Stated Goal: go back to [facility] PT Goal Formulation: With patient Time For Goal Achievement: 09/15/20 Potential to Achieve Goals: Good    Frequency Min 2X/week   Barriers to discharge        Co-evaluation               AM-PAC PT "6 Clicks" Mobility  Outcome Measure Help needed turning from your back to your side while in a flat bed without using bedrails?: A Lot Help needed moving from lying on your back to sitting on the side  of a flat bed without using bedrails?: Total Help needed moving to and from a bed to a chair (including a wheelchair)?: Total Help needed standing up from a chair using your arms (e.g., wheelchair or bedside chair)?: Total Help needed to walk in hospital room?: Total Help needed climbing 3-5 steps with a railing? : Total 6 Click Score: 7    End of Session   Activity Tolerance: Treatment limited secondary to medical complications (Comment) Patient left: in bed;with call bell/phone within reach;Other (comment) (in care of RN and transport tech) Nurse Communication: Mobility status PT Visit Diagnosis: Unsteadiness on feet (R26.81);Other abnormalities of gait and mobility (R26.89);Muscle weakness (generalized) (M62.81);Difficulty in walking, not elsewhere classified (R26.2);Other symptoms and signs involving the nervous system (R29.898)    Time: 5631-4970 PT Time Calculation (min) (ACUTE ONLY): 9 min   Charges:   PT Evaluation $PT Eval Moderate Complexity: 1 Mod          Windell Norfolk, DPT, PN1   Supplemental Physical Therapist [place]    Pager [phone] Acute Rehab Office [phone]

## 2020-09-01 NOTE — Progress Notes (Addendum)
Daily Progress Note   Patient Name: Luke Kaufman       Date: 09/01/2020 DOB: 1950/07/09  Age: 70 y.o. MRN#: [id number] Attending Physician: Zenia Resides, MD Primary Care Physician: Rosaria Ferries, MD Admit Date: 08/30/2020  Reason for Consultation/Follow-up: Establishing goals of care  Subjective: Feels better today, wants to eat  Length of Stay: 1  Current Medications: Scheduled Meds:  . Chlorhexidine Gluconate Cloth  6 each Topical Daily  . cholestyramine  4 g Oral Daily  . mupirocin ointment  1 application Nasal BID  . pantoprazole  40 mg Oral Daily  . sertraline  50 mg Oral Daily  . vancomycin variable dose per unstable renal function (pharmacist dosing)   Does not apply See admin instructions    Continuous Infusions: . ceFEPime (MAXIPIME) IV Stopped (08/31/20 2045)  . heparin 700 Units/hr (09/01/20 0658)  . metronidazole 500 mg (09/01/20 0933)  . potassium chloride 10 mEq (09/01/20 1114)    PRN Meds:   Physical Exam Constitutional:      General: Luke Kaufman is not in acute distress. Cardiovascular:     Rate and Rhythm: Tachycardia present. Rhythm irregular.  Pulmonary:     Comments: Less labored than yesterday Skin:    General: Skin is warm and dry.  Neurological:     Mental Status: Luke Kaufman is alert and oriented to person, place, and time.             Vital Signs: BP (!) 112/94 (BP Location: Right Arm)   Pulse (!) 111   Temp 98.6 F (37 C) (Oral)   Resp (!) 21   Ht 5\' 8"  (1.727 m)   Wt 97.6 kg   SpO2 93%   BMI 32.72 kg/m  SpO2: SpO2: 93 % O2 Device: O2 Device: Nasal Cannula O2 Flow Rate: O2 Flow Rate (L/min): 3 L/min  Intake/output summary:   Intake/Output Summary (Last 24 hours) at 09/01/2020 1228 Last data filed at 09/01/2020 1114 Gross per 24 hour   Intake 5466.5 ml  Output 1470 ml  Net 3996.5 ml   LBM: Last BM Date: 09/01/20 Baseline Weight: Weight: 93.3 kg Most recent weight: Weight: 97.6 kg       Palliative Assessment/Data: PPS 20%    Flowsheet Rows   Flowsheet Row Most Recent Value  Intake Tab   Referral Department --  [FMTS]  Unit at Time of Referral Intermediate Care Unit  Palliative Care Primary Diagnosis Nephrology  Date Notified 08/31/20  Palliative Care Type New Palliative care  Reason for referral Clarify Goals of Care  Date of Admission 08/30/20  Date first seen by Palliative Care 08/31/20  # of days Palliative referral response time 0 Day(s)  # of days IP prior to Palliative referral 1  Clinical Assessment   Palliative Performance Scale Score 20%  Psychosocial & Spiritual Assessment   Palliative Care Outcomes   Patient/Family meeting held? Yes  Who was at the meeting? patient  Palliative Care Outcomes Clarified goals of care      Patient Active Problem List   Diagnosis Date Noted  . Dyspnea   . Acute renal failure (ARF) ([place]) 08/31/2020  . Transaminitis   . Non-intractable vomiting   . Metabolic acidosis  08/30/2020  . Acute kidney injury (AKI) with acute tubular necrosis (ATN) ([place]) 11/11/2018  . Upper airway cough syndrome 03/31/2018  . DOE (dyspnea on exertion) 03/30/2018  . 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 Acute renal failure ([place]) 10/14/2017  . Pulmonary embolism and infarction ([place]) 10/14/2017  . Acute metabolic encephalopathy 49/70/2637  . Cardiac arrest ([place]) 10/14/2017  . Non-STEMI (non-ST elevated myocardial infarction) ([place])   . Pulmonary embolus, left ([place])   . Acute deep vein thrombosis (DVT) of distal end of left lower extremity ([place])   . Acute deep vein thrombosis (DVT) of radial vein of right upper extremity ([place])   . Left hemiparesis ([place])   . Severe protein-calorie malnutrition ([place])   . Acute encephalopathy   . Elevated troponin   . Acute on  chronic respiratory failure with hypoxia ([place])   . Cardiac arrest ([place])   . AKI (acute kidney injury) ([place])   . Acute respiratory failure with hypoxemia ([place])   . Respiratory failure ([place])   . Abdominal pain 09/04/2017  . PUD (peptic ulcer disease) 09/04/2017  . Mesenteric ischemia ([place]) 09/04/2017  . 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 Left arm numbness 12/14/2015  . Numbness 12/14/2015  . Bilateral carotid artery stenosis 11/22/2015  . Recurrent major depressive disorder in partial remission ([place]) 10/19/2015  . Mitral valve prolapse 09/07/2015  . Vitamin D deficiency 08/31/2015  . Arterial atherosclerosis 04/10/2015  . Cataract 04/10/2015  . Cerebrovascular accident, old 04/10/2015  . Gonalgia 04/10/2015  . HLD (hyperlipidemia) 04/10/2015  . 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 Renal artery stenosis ([place]) 04/10/2015  . History of non-ST elevation myocardial infarction (NSTEMI) 04/10/2015  . Anemia associated with acute blood loss 08/24/2014  . Thrombocytopenia ([place])   . Hypokalemia   . Essential hypertension 08/09/2014  . Chest pain 08/09/2014  . Near syncope 08/09/2014  . CKD (chronic kidney disease) stage 3, GFR 30-59 ml/min (HCC) 08/09/2014  . Tobacco abuse 08/09/2014  . Upper GI bleeding   . Gastric ulcer with hemorrhage   . Acute blood loss anemia   . Cerebral infarction ([place]) 05/16/2013  . HTN (hypertension) 05/16/2013  . 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    Palliative Care Assessment & Plan   HPI: 70 y.o. 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 Diagnosed with acute renal failure - initial creatinine 8.31.  Creatinine trending down. Some concern for pulmonary edema and respiratory failure. Also with a fib RVR. Procalcitonin found to be 1.84. PMT consulted to discuss [place].   Assessment: Patient feeling better today. Appears better - breathing less labored, HR better controlled.  We reviewed our discussion yesterday - Discussed DNR/DNI, does not want to go to ICU. Luke Kaufman continues to be okay with continuing current measures. Luke Kaufman also continues to insist that Luke Kaufman wants to return to [facility] soon - especially if Luke Kaufman appears to be at end of life Luke Kaufman would like to die there instead of hospital.  Recommendations/Plan:  DNR/DNI  Continue current measures - if worsens, transition to comfort measures and hopeful to get him back to [facility] for end of life care  Would recommend dilaudid if needed for comfort measures/ avoid  morphine in renal failure  No surrogate decision maker/next of kin  Recommend completion of MOST prior to discharge as previous MOST was destroyed upon admission  Code Status:  DNR  Care plan was discussed with patient, Dr. Thompson Grayer  Thank you for allowing the Palliative Medicine Team to assist in the care of this patient.   Total Time 20 minutes Prolonged Time Billed  no       Greater than 50%  of this time was spent counseling and coordinating care related to the above assessment and plan.  Juel Burrow, DNP, [facility] Palliative Medicine Team Team Phone # [phone]  Pager [phone]

## 2020-09-01 NOTE — Progress Notes (Signed)
OT Cancellation Note, Discharge, defer to SNF OT as tolerated  Patient Details Name: Luke Kaufman MRN: [id number] DOB: 1950/07/06   Cancelled Treatment:    Reason Eval/Treat Not Completed: PT in room evaluating pt at bed level because even with just talking pt's HR was going from 110's up to 160's just with talking in bed. Feel pt could benefit from OT at SNF as long as his vitals are stable enough to do so. Pt is from SNF with plan to go back to SNF and is Medicare so will defer OT eval back to facility as pt is able to tolerate and staff deem appropriate.   [place], OTR/L Acute Rehab Services Pager [phone] Office [phone]     Almon Register 09/01/2020, 1:13 PM

## 2020-09-01 NOTE — Progress Notes (Addendum)
Estevon Fluke Admit Date: 08/30/2020 09/01/2020 [facility]  Nephrology Progress Note   Subjective: patient easily awakened by voice. States "idk" when asked about respiratory status. Reports no other complaints.   Increased O2 requirement, now up to 5L [place] overnight. Seems more frail to me even from admission- UOP 970 and crt down a little more.  Sounds like had a really productive conversation with palliative care      Creatinine, Ser (mg/dL)  Date Value  09/01/2020 7.14 (H)  08/31/2020 7.45 (H)  08/30/2020 7.83 (H)  08/30/2020 8.31 (H)  08/17/2020 2.82 (H)  11/25/2018 3.34 (H)  11/24/2018 3.39 (H)  11/23/2018 3.36 (H)  11/22/2018 3.45 (H)  11/21/2018 3.70 (H)   I/Os:  Intake/Output Summary (Last 24 hours) at 09/01/2020 1121 Last data filed at 09/01/2020 0617 Gross per 24 hour  Intake 4503.33 ml  Output 1370 ml  Net 3133.33 ml    PMH  Past Medical History:  Diagnosis Date  . Acute metabolic encephalopathy 1/74/0814  . Gastric ulcer   . Hyperlipemia   . Hypertension   . Hypospadias   . MI (myocardial infarction) ([place])   . Pulmonary embolism and infarction ([place]) 10/14/2017  . Stroke ([place])   . Tobacco abuse    PSH  Past Surgical History:  Procedure Laterality Date  . APPLICATION OF WOUND VAC N/A 09/04/2017   Procedure: APPLICATION OF WOUND VAC and Exploration of Abdomen.;  Surgeon: Elam Dutch, MD;  Location: [place];  Service: Vascular;  Laterality: N/A;  . APPLICATION OF WOUND VAC N/A 09/06/2017   Procedure: APPLICATION OF WOUND VAC;  Surgeon: Georganna Skeans, MD;  Location: [place];  Service: General;  Laterality: N/A;  . APPLICATION OF WOUND VAC N/A 09/08/2017   Procedure: APPLICATION OF WOUND VAC;  Surgeon: Judeth Horn, MD;  Location: [place];  Service: General;  Laterality: N/A;  . APPLICATION OF WOUND VAC N/A 09/12/2017   Procedure: APPLICATION OF WOUND VAC  (CHANGE);  Surgeon: Judeth Horn, MD;  Location: [place];  Service: General;  Laterality:  N/A;  . APPLICATION OF WOUND VAC N/A 09/14/2017   Procedure: APPLICATION OF WOUND VAC;  Surgeon: Greer Pickerel, MD;  Location: [place];  Service: General;  Laterality: N/A;  . APPLICATION OF WOUND VAC N/A 09/23/2017   Procedure: APPLICATION OF NEGATIVE PRESSURE THERAPY;  Surgeon: Kieth Brightly Arta Bruce, MD;  Location: [place];  Service: General;  Laterality: N/A;  . BYPASS GRAFT AORTA TO AORTA  09/04/2017   Procedure: Aorta to Superior Mesinteric aorta bypass ultrason Left common Femoral/;  Surgeon: Elam Dutch, MD;  Location[facility];  Service: Vascular;;  . ESOPHAGOGASTRODUODENOSCOPY N/A 08/09/2014   Procedure: ESOPHAGOGASTRODUODENOSCOPY (EGD);  Surgeon: Jerene Bears, MD;  Location[facility];  Service: Gastroenterology;  Laterality: N/A;  . ESOPHAGOGASTRODUODENOSCOPY N/A 08/10/2014   Procedure: ESOPHAGOGASTRODUODENOSCOPY (EGD);  Surgeon: Jerene Bears, MD;  Location: [facility];  Service: Gastroenterology;  Laterality: N/A;  . ILEOSTOMY Right 09/06/2017   Procedure: POSSIBLE ILEOSTOMY;  Surgeon: Georganna Skeans, MD;  Location: [place];  Service: General;  Laterality: Right;  . INSERTION OF MESH N/A 09/23/2017   Procedure: INSERTION OF MESH;  Surgeon: Kinsinger, Arta Bruce, MD;  Location: [place];  Service: General;  Laterality: N/A;  . IR FLUORO GUIDE CV LINE RIGHT  10/04/2017  . IR US GUIDE VASC ACCESS RIGHT  10/04/2017  . LAPAROTOMY Right 09/04/2017   Procedure: EXPLORATORY LAPAROTOMY Right Colon Resection;  Surgeon: Elam Dutch, MD;  Location: [place];  Service:  Vascular;  Laterality: Right;  . LAPAROTOMY N/A 09/06/2017   Procedure: EXPLORATORY LAPAROTOMY;  Surgeon: Georganna Skeans, MD;  Location: [place];  Service: General;  Laterality: N/A;  . LAPAROTOMY N/A 09/08/2017   Procedure: EXPLORATORY LAPAROTOMY, ABDOMINAL WASHOUT, PARTIAL CLOSURE OF ABDOMEN;  Surgeon: Judeth Horn, MD;  Location: [place];  Service: General;  Laterality: N/A;  . LAPAROTOMY N/A 09/10/2017   Procedure: EXPLORATORY LAPAROTOMY  WITH POSSIBLE WOUND CLOSURE ABDOMEN;  Surgeon: Judeth Horn, MD;  Location: [place];  Service: General;  Laterality: N/A;  . LAPAROTOMY N/A 09/12/2017   Procedure: EXPLORATORY LAPAROTOMY;  Surgeon: Judeth Horn, MD;  Location: [place];  Service: General;  Laterality: N/A;  . LAPAROTOMY N/A 09/14/2017   Procedure: EXPLORATORY LAPAROTOMY;  Surgeon: Greer Pickerel, MD;  Location: [place];  Service: General;  Laterality: N/A;  . LAPAROTOMY N/A 09/17/2017   Procedure: EXPLORATORY LAPAROTOMY AND PARTIAL CLOSURE OF ABDOMEN;  Surgeon: Judeth Horn, MD;  Location: [place];  Service: General;  Laterality: N/A;  . LAPAROTOMY N/A 09/19/2017   Procedure: EXPLORATORY LAPAROTOMY & DRESSING CHANGE;  Surgeon: Coralie Keens, MD;  Location: [place];  Service: General;  Laterality: N/A;  . LAPAROTOMY N/A 09/23/2017   Procedure: EXPLORATORY LAPAROTOMY, INCISIONAL HERNIA REPAIR WITH MESH INSERTION, PARTIAL CLOSURE OF SKIN;  Surgeon: Kieth Brightly, Arta Bruce, MD;  Location: [place];  Service: General;  Laterality: N/A;  . TRACHEOSTOMY TUBE PLACEMENT N/A 09/12/2017   Procedure: TRACHEOSTOMY;  Surgeon: Judeth Horn, MD;  Location: [place];  Service: General;  Laterality: N/A;  . VISCERAL ANGIOGRAM N/A 09/04/2017   Procedure: Non Selective MESENTERIC ANGIOGRAM,;  Surgeon: Elam Dutch, MD;  Location: [facility] OR;  Service: Vascular;  Laterality: N/A;   FH  Family History  Problem Relation Age of Onset  . Pancreatic cancer Mother   . Heart attack Father 31   [place]  reports that he quit smoking about 3 years ago. His smoking use included cigarettes. He has a 100.00 pack-year smoking history. He has never used smokeless tobacco. He reports current alcohol use. He reports that he does not use drugs.  Allergies No Known Allergies  Home medications Prior to Admission medications   Medication Sig Start Date End Date Taking? Authorizing Provider  acetaminophen (TYLENOL) 325 MG tablet Place 2 tablets (650 mg total) into feeding tube every 6 (six)  hours. Patient taking differently: Take 650 mg by mouth every 6 (six) hours as needed for mild pain, fever or headache. 10/09/17  Yes Allie Bossier, MD  apixaban (ELIQUIS) 2.5 MG TABS tablet Take 2.5 mg by mouth 2 (two) times daily.   Yes [provider]  ascorbic acid (VITAMIN C) 500 MG tablet Take 500 mg by mouth daily.   Yes [provider]  atorvastatin (LIPITOR) 80 MG tablet Place 1 tablet (80 mg total) into feeding tube daily at 6 PM. Patient taking differently: Take 80 mg by mouth daily at 6 PM. 10/09/17  Yes Allie Bossier, MD  Cholecalciferol (VITAMIN D3 ULTRA POTENCY) 1.25 MG (50000 UT) TABS Take 1.25 mg by mouth once a week. On Thursday's   Yes [provider]  cholestyramine (QUESTRAN) 4 g packet Take 4 g by mouth daily.   Yes [provider]  ergocalciferol (VITAMIN D2) 1.25 MG (50000 UT) capsule Take 50,000 Units by mouth once a week. On thursdays   Yes [provider]  fluticasone (FLONASE) 50 MCG/ACT nasal spray Place 1 spray into both nostrils daily.   Yes [provider]  gabapentin (  NEURONTIN) 100 MG capsule Take 100 mg by mouth daily.    Yes [provider]  omeprazole (PRILOSEC) 20 MG capsule Take 20 mg by mouth daily.   Yes [provider]  ondansetron (ZOFRAN) 4 MG tablet Take 4 mg by mouth every 8 (eight) hours as needed for nausea or vomiting.   Yes [provider]  Probiotic Product (PROBIOTIC PO) Take 1 tablet by mouth daily.   Yes [provider]  sertraline (ZOLOFT) 50 MG tablet Take 50 mg by mouth daily. 08/01/20  Yes [provider]  sodium bicarbonate 650 MG tablet Take 1,300 mg by mouth 3 (three) times daily.   Yes [provider]  sodium chloride 0.9 % SOLN 75 mLs by CRRT route See admin instructions. 75 ml/hr intravenously every shift for abdominal pain, nausea   Yes [provider]  tamsulosin (FLOMAX) 0.4 MG CAPS capsule Take 0.4 mg by mouth daily.    Yes [provider]  OXYGEN Place 2 L/min into the nose at bedtime. Nasal cannula    [provider]    Current Medications Scheduled Meds: . atorvastatin  80 mg Oral q1800  . Chlorhexidine Gluconate Cloth  6 each Topical Daily  . cholestyramine  4 g Oral Daily  . mupirocin ointment  1 application Nasal BID  . pantoprazole  40 mg Oral Daily  . sertraline  50 mg Oral Daily  . vancomycin variable dose per unstable renal function (pharmacist dosing)   Does not apply See admin instructions   Continuous Infusions: . ceFEPime (MAXIPIME) IV Stopped (08/31/20 2045)  . heparin 700 Units/hr (09/01/20 0658)  . metronidazole 500 mg (09/01/20 0933)  . potassium chloride 10 mEq (09/01/20 1114)  .  sodium bicarbonate (isotonic) infusion in sterile water 75 mL/hr at 09/01/20 0311   PRN Meds:.  CBC Recent Labs  Lab 08/30/20 1241 08/31/20 0645 08/31/20 1347 09/01/20 0518  WBC 6.3 5.6 4.1 8.4  NEUTROABS 5.5  --  4.0  --   HGB 9.9* 9.8* 10.4* 9.5*  HCT 31.2* 30.0* 30.6* 28.1*  MCV 94.8 91.5 88.7 88.9  PLT 129* 128* 127* 063*   Basic Metabolic Panel Recent Labs  Lab 08/30/20 1231 08/30/20 1241 08/30/20 2207 08/31/20 0645 09/01/20 0844  NA 139 138 139 138 135  K 3.7 3.7 3.8 3.2* 2.9*  CL  --  111 111 107 100  CO2  --  13* 12* 17* 18*  GLUCOSE  --  88 80 90 102*  BUN  --  73* 75* 75* 81*  CREATININE  --  8.31* 7.83* 7.45* 7.14*  CALCIUM  --  7.6* 7.4* 7.3* 6.9*  PHOS  --   --   --  8.0*  --     Physical Exam  Blood pressure (!) 106/52, pulse (!) 106, temperature 98.4 F (36.9 C), temperature source Oral, resp. rate (!) 21, height _0  (1.727 m), weight 97.6 kg, SpO2 92 %.  GEN: elderly male, sleeping supine in bed initially  CV: irregular rhythm, radial pulses palpable  PULM: 5L [place], oxygen sats 92% while at bedside,slightly increased WOB compared to initial exam yesterday, patient sleepy comfortably   ABD: abdominal wound dressing is dry, NT to palpation,  BS present EXT: edema in LEs bilaterally   Assessment Heraclio Seidman is a medically complex pt with HTN , O2 dep resp failure at baseline, CAD, PAD, CKD with solitary functioning kidney 70 y.o. male admitted with a FTT as well as A on CRF  Plan 1. Acute Renal Failure: Creatinine improved from 7.4 to 7.1 1. Patient met with palliative care and changed code status to DNR, continues to decline HD therapy 2. Patient prefers to transfer to previous facility if declines clinically, will continue with current efforts for treatment for now  3. Slightly volume increased, will DC IVFs, no diuresis at this time  4. Daily monitoring of creatinine 5. Strict I/Os 6. Foley cathether in place, 916m output recorded  7. Pt has made it clear that he is not interested in chronic dialysis  2. Metabolic Acidosis:improved  3. Chronic Respiratory failure: stopping IVFs, increased to 5L [place], per primary  4. HTN: BP measurements lower overnight, 103/56 this AM, continue to monitor  5. AF: continue Eliquis  6. Anemia: Hgb stable, monitor 7. Hypokalemia: low K 2.9, will give additional replacement  8. Abx therapy with Cefepime and Vanc per primary  Makiera [facility]  09/01/2020, 11:21 AM   Patient seen and examined, agree with above note with above modifications.  Patient continues to do poorly from a functionality standpoint-  He appears to be making urine and crt has improved since admit but is still poor.  His tank is full so IVF stopped.  K is low-  Being repleted. Patient seems appropriate regarding his goals.  I suspect from what I have seen that he may be close to end of life.  Renal does not have much to offer, will sign off but feel free to call with questions   KCorliss Parish MD 09/01/2020

## 2020-09-01 NOTE — Progress Notes (Signed)
FPTS Interim Progress Note  S: Patient evaluated at bedside for PM given watcher status.  Patient resting comfortably in bed.  No new complaints at this point.  Plan is to continue the current care.  O: BP 116/80 (BP Location: Right Arm)   Pulse (!) 115   Temp 98.6 F (37 C) (Oral)   Resp (!) 21   Ht 5\' 8"  (1.727 m)   Wt 215 lb 3.2 oz (97.6 kg)   SpO2 92%   BMI 32.72 kg/m     Honor Junes, MD 09/01/2020, 11:52 PM PGY-1, [place] Medicine Service pager [phone]

## 2020-09-01 NOTE — Consult Note (Addendum)
Cardiology Consultation:   Patient ID: Luke Kaufman; [id number]; 01-10-1951   Admit date: 08/30/2020 Date of Consult: 09/01/2020  Primary Care Provider: Rosaria Ferries, MD Primary Cardiologist: Fransico Him, MD Primary Electrophysiologist: None   Patient Profile:   Luke Kaufman is a 70 y.o. male with a history of CAD and previous infarct in 2015, V. 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 Thompson Grayer.  History of Present Illness:   Luke Kaufman is currently admitted to the hospital with acute on chronic renal failure, possible aspiration pneumonia, multiple electrolyte abnormalities including hypokalemia, hypomagnesemia, and hypocalcemia, gallstones with HIDA scan concerning for acute cholecystitis, and recurring atrial fibrillation.  He has had palliative care consultation for discussion of goals of care, currently DNR/DNI and would ultimately like to return to [facility].  He has had paroxysmal atrial fibrillation with RVR as well as atrial tachycardia and salvos on baseline sinus rhythm and sinus tachycardia by telemetry review.  This evening he is in sinus rhythm with frequent PACs and bursts of probable atrial fibrillation.  He is not aware of any sense of palpitations and is not having any chest pain. CHA2DS2-VASc score is 5.  Echocardiogram from May 12 reported LVEF 60 to 65% without regional wall motion abnormalities, also normal RV contraction.  Peak high-sensitivity troponin I 869 during hospital stay, most likely consistent with demand ischemia.  Past Medical History:  Diagnosis Date  . Acute metabolic encephalopathy 10/11/2977  . Gastric ulcer   . Hyperlipemia   .  Hypertension   . Hypospadias   . MI (myocardial infarction) ([place])   . Pulmonary embolism and infarction ([place]) 10/14/2017  . Stroke ([place])   . Tobacco abuse     Past Surgical History:  Procedure Laterality Date  . APPLICATION OF WOUND VAC N/A 09/04/2017   Procedure: APPLICATION OF WOUND VAC and Exploration of Abdomen.;  Surgeon: Elam Dutch, MD;  Location: [place];  Service: Vascular;  Laterality: N/A;  . APPLICATION OF WOUND VAC N/A 09/06/2017   Procedure: APPLICATION OF WOUND VAC;  Surgeon: Georganna Skeans, MD;  Location: [place];  Service: General;  Laterality: N/A;  . APPLICATION OF WOUND VAC N/A 09/08/2017   Procedure: APPLICATION OF WOUND VAC;  Surgeon: Judeth Horn, MD;  Location: [place];  Service: General;  Laterality: N/A;  . APPLICATION OF WOUND VAC N/A 09/12/2017   Procedure: APPLICATION OF WOUND VAC  (CHANGE);  Surgeon: Judeth Horn, MD;  Location: [place];  Service: General;  Laterality: N/A;  . APPLICATION OF WOUND VAC N/A 09/14/2017   Procedure: APPLICATION OF WOUND VAC;  Surgeon: Greer Pickerel, MD;  Location: [place];  Service: General;  Laterality: N/A;  . APPLICATION OF WOUND VAC N/A 09/23/2017   Procedure: APPLICATION OF NEGATIVE PRESSURE THERAPY;  Surgeon: Kieth Brightly Arta Bruce, MD;  Location: [place];  Service: General;  Laterality: N/A;  . BYPASS GRAFT AORTA TO AORTA  09/04/2017   Procedure: Aorta to Superior Mesinteric aorta bypass ultrason Left common Femoral/;  Surgeon: Elam Dutch, MD;  Location: [facility] OR;  Service: Vascular;;  . ESOPHAGOGASTRODUODENOSCOPY N/A 08/09/2014   Procedure: ESOPHAGOGASTRODUODENOSCOPY (EGD);  Surgeon: Jerene Bears, MD;  Location: [facility];  Service: Gastroenterology;  Laterality: N/A;  . ESOPHAGOGASTRODUODENOSCOPY N/A 08/10/2014  Procedure: ESOPHAGOGASTRODUODENOSCOPY (EGD);  Surgeon: Jerene Bears, MD;  Location: [facility];  Service: Gastroenterology;  Laterality: N/A;  . ILEOSTOMY Right 09/06/2017   Procedure: POSSIBLE ILEOSTOMY;  Surgeon:  Georganna Skeans, MD;  Location: [place];  Service: General;  Laterality: Right;  . INSERTION OF MESH N/A 09/23/2017   Procedure: INSERTION OF MESH;  Surgeon: Kinsinger, Arta Bruce, MD;  Location: [place];  Service: General;  Laterality: N/A;  . IR FLUORO GUIDE CV LINE RIGHT  10/04/2017  . IR US GUIDE VASC ACCESS RIGHT  10/04/2017  . LAPAROTOMY Right 09/04/2017   Procedure: EXPLORATORY LAPAROTOMY Right Colon Resection;  Surgeon: Elam Dutch, MD;  Location: [place];  Service: Vascular;  Laterality: Right;  . LAPAROTOMY N/A 09/06/2017   Procedure: EXPLORATORY LAPAROTOMY;  Surgeon: Georganna Skeans, MD;  Location: [place];  Service: General;  Laterality: N/A;  . LAPAROTOMY N/A 09/08/2017   Procedure: EXPLORATORY LAPAROTOMY, ABDOMINAL WASHOUT, PARTIAL CLOSURE OF ABDOMEN;  Surgeon: Judeth Horn, MD;  Location: [place];  Service: General;  Laterality: N/A;  . LAPAROTOMY N/A 09/10/2017   Procedure: EXPLORATORY LAPAROTOMY WITH POSSIBLE WOUND CLOSURE ABDOMEN;  Surgeon: Judeth Horn, MD;  Location: [place];  Service: General;  Laterality: N/A;  . LAPAROTOMY N/A 09/12/2017   Procedure: EXPLORATORY LAPAROTOMY;  Surgeon: Judeth Horn, MD;  Location: [place];  Service: General;  Laterality: N/A;  . LAPAROTOMY N/A 09/14/2017   Procedure: EXPLORATORY LAPAROTOMY;  Surgeon: Greer Pickerel, MD;  Location: [place];  Service: General;  Laterality: N/A;  . LAPAROTOMY N/A 09/17/2017   Procedure: EXPLORATORY LAPAROTOMY AND PARTIAL CLOSURE OF ABDOMEN;  Surgeon: Judeth Horn, MD;  Location: [place];  Service: General;  Laterality: N/A;  . LAPAROTOMY N/A 09/19/2017   Procedure: EXPLORATORY LAPAROTOMY & DRESSING CHANGE;  Surgeon: Coralie Keens, MD;  Location: [place];  Service: General;  Laterality: N/A;  . LAPAROTOMY N/A 09/23/2017   Procedure: EXPLORATORY LAPAROTOMY, INCISIONAL HERNIA REPAIR WITH MESH INSERTION, PARTIAL CLOSURE OF SKIN;  Surgeon: Kieth Brightly, Arta Bruce, MD;  Location: [place];  Service: General;  Laterality: N/A;  . TRACHEOSTOMY  TUBE PLACEMENT N/A 09/12/2017   Procedure: TRACHEOSTOMY;  Surgeon: Judeth Horn, MD;  Location: [place];  Service: General;  Laterality: N/A;  . VISCERAL ANGIOGRAM N/A 09/04/2017   Procedure: Non Selective MESENTERIC ANGIOGRAM,;  Surgeon: Elam Dutch, MD;  Location: [facility] OR;  Service: Vascular;  Laterality: N/A;     Inpatient Medications: Scheduled Meds: . Chlorhexidine Gluconate Cloth  6 each Topical Daily  . cholestyramine  4 g Oral Daily  . magnesium oxide  200 mg Oral Daily  . mupirocin ointment  1 application Nasal BID  . pantoprazole  40 mg Oral Daily  . sertraline  50 mg Oral Daily  . vancomycin variable dose per unstable renal function (pharmacist dosing)   Does not apply See admin instructions   Continuous Infusions: . ceFEPime (MAXIPIME) IV 2 g (09/01/20 1823)  . heparin 700 Units/hr (09/01/20 0658)  . metronidazole 500 mg (09/01/20 0933)    Allergies:   No Known Allergies  Social History:   Social History   Tobacco Use  . Smoking status: Former Smoker    Packs/day: 2.00    Years: 50.00    Pack years: 100.00    Types: Cigarettes    Quit date: 08/20/2017    Years since quitting: 3.0  . Smokeless tobacco: Never Used  Substance Use Topics  . Alcohol use: Yes    Alcohol/week: 0.0 standard drinks    Comment: occasional  Family History:   The patient's family history includes Heart attack (age of onset: 88) in his father; Pancreatic cancer in his mother.  ROS:  Please see the history of present illness.  No current abdominal pain, no nausea or emesis.  Limited appetite.  All other ROS reviewed and negative.     Physical Exam/Data:   Vitals:   09/01/20 0840 09/01/20 0939 09/01/20 1201 09/01/20 1659  BP: 115/77 (!) 106/52 (!) 112/94 126/76  Pulse: (!) 117 (!) 106 (!) 111 (!) 107  Resp: 20 (!) 21 (!) 21 19  Temp: 98.4 F (36.9 C) 98.4 F (36.9 C) 98.6 F (37 C) 98.9 F (37.2 C)  TempSrc: Oral Oral Oral Oral  SpO2: 93% 92% 93% 91%  Weight:      Height:         Intake/Output Summary (Last 24 hours) at 09/01/2020 1918 Last data filed at 09/01/2020 1114 Gross per 24 hour  Intake 2660.61 ml  Output 620 ml  Net 2040.61 ml   Filed Weights   02-Sep-2020 1251 Sep 02, 2020 2038 09/01/20 0400  Weight: 93.3 kg 92.8 kg 97.6 kg   Body mass index is 32.72 kg/m.   Gen: Chronically ill-appearing male in no distress. HEENT: Conjunctiva and lids normal. Neck: Supple, no elevated JVP or carotid bruits, no thyromegaly. Lungs: Diffuse rhonchi with prolonged expiratory phase. Cardiac: Irregularly irregular, no S3, 1/6 systolic murmur, no pericardial rub. Abdomen: Soft, nontender, bowel sounds present. Extremities: Mild ankle edema, distal pulses 2+. Skin: Warm and dry. Musculoskeletal: No kyphosis. Neuropsychiatric: Alert and oriented x3, affect grossly appropriate.  EKG:  An ECG dated 09/01/2020 was personally reviewed today and demonstrated:  Sinus rhythm with salvos of rapid atrial fibrillation, decreased R wave progression, nonspecific T wave changes.  Telemetry:  I personally reviewed telemetry which shows sinus rhythm with frequent PACs and bursts of atrial fibrillation.  Laboratory Data:  Chemistry Recent Labs  Lab 09/02/20 2207 08/31/20 0645 09/01/20 0844  NA 139 138 135  K 3.8 3.2* 2.9*  CL 111 107 100  CO2 12* 17* 18*  GLUCOSE 80 90 102*  BUN 75* 75* 81*  CREATININE 7.83* 7.45* 7.14*  CALCIUM 7.4* 7.3* 6.9*  GFRNONAA 7* 7* 8*  ANIONGAP 16* 14 17*    Recent Labs  Lab 08/31/20 0645 09/01/20 0844  PROT 5.5* 5.2*  ALBUMIN 2.4* 1.9*  AST 63* 56*  ALT 46* 37  ALKPHOS 200* 131*  BILITOT 1.6* 1.2   Hematology Recent Labs  Lab 08/31/20 0645 08/31/20 1347 09/01/20 0518  WBC 5.6 4.1 8.4  RBC 3.28* 3.45* 3.16*  HGB 9.8* 10.4* 9.5*  HCT 30.0* 30.6* 28.1*  MCV 91.5 88.7 88.9  MCH 29.9 30.1 30.1  MCHC 32.7 34.0 33.8  RDW 14.6 14.6 14.8  PLT 128* 127* 125*   Cardiac Enzymes Recent Labs  Lab 02-Sep-2020 1241 09/02/20 1425  09/02/20 2117 09-02-2020 2318  TROPONINIHS 556* 802* 869* 827*   BNP Recent Labs  Lab Sep 02, 2020 1241  BNP 348.1*    Radiology/Studies:  US RENAL  Result Date: Sep 02, 2020 CLINICAL DATA:  Azotemia. EXAM: RENAL / URINARY TRACT ULTRASOUND COMPLETE COMPARISON:  Renal ultrasound 11/09/2018.  Abdominal CT 10/26/2017 FINDINGS: Right Kidney: Renal measurements: 11.1 x 6.1 x 5.6 cm = volume: 198 mL. Mild increased renal parenchymal echogenicity. Mild thinning of the renal parenchyma. No hydronephrosis. No evidence of focal lesion or stone. Left Kidney: Renal measurements: 8.1 x 4.2 x 3.8 cm = volume: 68 mL. Left kidney is difficult to visualize. There  is prominent thinning of the renal parenchyma with increased parenchymal echogenicity. No hydronephrosis. No evidence of focal lesion or stone. Bladder: Appears normal for degree of bladder distention. Other: None. IMPRESSION: 1. Chronic left renal atrophy and sequela of chronic renal disease. 2. Mild increased right renal parenchymal echogenicity consistent with chronic medical renal disease. No hydronephrosis. Electronically Signed   By: Kolbi Rake M.D.   On: 08/30/2020 17:50   NM Hepato W/EF  Result Date: 09/01/2020 CLINICAL DATA:  In cholelithiasis with distended gallbladder and upper normal gallbladder wall thickness, question cholecystitis EXAM: NUCLEAR MEDICINE HEPATOBILIARY IMAGING TECHNIQUE: Sequential images of the abdomen were obtained out to 120 minutes following intravenous administration of radiopharmaceutical. RADIOPHARMACEUTICALS:  5 mCi Tc-43m  Choletec IV COMPARISON:  Ultrasound abdomen 08/31/2020 FINDINGS: Normal tracer extraction from bloodstream indicating normal hepatocellular function. Prompt excretion of tracer into biliary tree. Small bowel visualized at 41 minutes. At 1 hour gallbladder had not visualized. Additional 60 minutes of imaging performed. Gallbladder failed to visualize despite delayed imaging. Findings are consistent  with cystic duct obstruction and acute cholecystitis. IMPRESSION: Nonvisualization of the gallbladder at 2 hours consistent with acute cholecystitis. Electronically Signed   By: Lavonia Dana M.D.   On: 09/01/2020 16:54   DG CHEST PORT 1 VIEW  Result Date: 08/31/2020 CLINICAL DATA:  Shortness of breath EXAM: PORTABLE CHEST 1 VIEW COMPARISON:  08/30/2020 FINDINGS: Heart is normal size. Bibasilar opacities, new since prior study. No effusions or pneumothorax. Mild atherosclerotic calcifications. No acute bony abnormality. IMPRESSION: New bibasilar atelectasis or infiltrates. Electronically Signed   By: Rolm Baptise M.D.   On: 08/31/2020 08:35   DG Chest Port 1 View  Result Date: 08/30/2020 CLINICAL DATA:  Shortness of breath EXAM: PORTABLE CHEST 1 VIEW COMPARISON:  November 11, 2018 FINDINGS: There is interstitial thickening without edema or consolidation. Heart is upper normal in size with pulmonary vascularity normal. No adenopathy. There is aortic atherosclerosis. No bone lesions. IMPRESSION: Interstitium is somewhat thickened, likely indicative of underlying chronic bronchitis. No edema or airspace opacity. Heart upper normal in size. Aortic Atherosclerosis (ICD10-I70.0). Electronically Signed   By: Lowella Grip III M.D.   On: 08/30/2020 13:01   ECHOCARDIOGRAM LIMITED  Result Date: 08/31/2020    ECHOCARDIOGRAM LIMITED REPORT   Patient Name:   MERCER PEIFER Date of Exam: 08/31/2020 Medical Rec #:  [id number]       Height:       68.0 in Accession #:    [id number]      Weight:       204.5 lb Date of Birth:  [birth year]       BSA:          2.063 m Patient Age:    38 years        BP:           135/77 mmHg Patient Gender: M               HR:           93 bpm. Exam Location:  Inpatient Procedure: Limited Echo, Limited Color Doppler and Cardiac Doppler Indications:    Dyspnea  History:        Patient has prior history of Echocardiogram examinations, most                 recent 11/12/2018. Previous Myocardial  Infarction, Stroke; Risk                 Factors:Hypertension and Dyslipidemia.  Sonographer:  TAMARA CROWN RDCS Referring Phys: [id number] WILLIAM A HENSEL  Sonographer Comments: No parasternal window and Technically challenging study due to limited acoustic windows. Image acquisition challenging due to respiratory motion and Image acquisition challenging due to patient body habitus. IMPRESSIONS  1. Technically difficult echo with poor image quality.  2. Left ventricular ejection fraction, by estimation, is 60 to 65%. The left ventricle has normal function. The left ventricle has no regional wall motion abnormalities. Left ventricular diastolic parameters are consistent with Grade I diastolic dysfunction (impaired relaxation).  3. Right ventricular systolic function is normal. The right ventricular size is normal. There is normal pulmonary artery systolic pressure.  4. The mitral valve was not well visualized. Trivial mitral valve regurgitation. No evidence of mitral stenosis. FINDINGS  Left Ventricle: Left ventricular ejection fraction, by estimation, is 60 to 65%. The left ventricle has normal function. The left ventricle has no regional wall motion abnormalities. The left ventricular internal cavity size was normal in size. Left ventricular diastolic parameters are consistent with Grade I diastolic dysfunction (impaired relaxation). Right Ventricle: The right ventricular size is normal. Right vetricular wall thickness was not well visualized. Right ventricular systolic function is normal. There is normal pulmonary artery systolic pressure. The tricuspid regurgitant velocity is 2.44 m/s, and with an assumed right atrial pressure of 8 mmHg, the estimated right ventricular systolic pressure is 70.3 mmHg. Pericardium: Trivial pericardial effusion is present. Mitral Valve: The mitral valve was not well visualized. Trivial mitral valve regurgitation. No evidence of mitral valve stenosis. Tricuspid Valve: The tricuspid  valve is not well visualized. Tricuspid valve regurgitation is trivial. Aortic Valve: Aortic valve mean gradient measures 7.0 mmHg. Aortic valve peak gradient measures 11.8 mmHg. Additional Comments: Technically difficult echo with poor image quality.  LV Volumes (MOD) LV vol d, MOD A2C: 56.6 ml LV vol d, MOD A4C: 102.0 ml LV vol s, MOD A2C: 27.4 ml LV vol s, MOD A4C: 61.7 ml LV SV MOD A2C:     29.2 ml LV SV MOD A4C:     102.0 ml LV SV MOD BP:      36.3 ml RIGHT VENTRICLE RV S prime:     17.10 cm/s TAPSE (M-mode): 2.2 cm LEFT ATRIUM             Index LA Vol (A2C):   42.6 ml 20.65 ml/m LA Vol (A4C):   33.6 ml 16.29 ml/m LA Biplane Vol: 39.3 ml 19.05 ml/m  AORTIC VALVE AV Vmax:           172.00 cm/s AV Vmean:          123.000 cm/s AV VTI:            0.285 m AV Peak Grad:      11.8 mmHg AV Mean Grad:      7.0 mmHg LVOT Vmax:         103.00 cm/s LVOT Vmean:        72.100 cm/s LVOT VTI:          0.213 m LVOT/AV VTI ratio: 0.75 MITRAL VALVE                TRICUSPID VALVE MV Area (PHT): 3.19 cm     TR Peak grad:   23.8 mmHg MV Decel Time: 238 msec     TR Vmax:        244.00 cm/s MR Peak grad: 128.6 mmHg MR Mean grad: 92.0 mmHg     SHUNTS MR Vmax:  567.00 cm/s   Systemic VTI: 0.21 m MR Vmean:     456.0 cm/s MV E velocity: 97.70 cm/s MV A velocity: 136.00 cm/s MV E/A ratio:  0.72 Mertie Moores MD Electronically signed by Mertie Moores MD Signature Date/Time: 08/31/2020/10:15:42 AM    Final    US Abdomen Limited RUQ (LIVER/GB)  Result Date: 08/31/2020 CLINICAL DATA:  Nausea and vomiting. EXAM: ULTRASOUND ABDOMEN LIMITED RIGHT UPPER QUADRANT COMPARISON:  Abdominal CT 10/26/2017 FINDINGS: Gallbladder: Distended containing intraluminal sludge and stones. Upper normal gallbladder thickness of 3 mm. No pericholecystic fluid. No sonographic Murphy sign noted by sonographer. Common bile duct: Diameter: 6 mm, normal Liver: Limited assessment due to difficulties with positioning. No obvious liver lesion is seen. Normal  parenchymal echogenicity. Portal vein is patent on color Doppler imaging with normal direction of blood flow towards the liver. Other: No right upper quadrant ascites. IMPRESSION: 1. Gallstones with mild gallbladder distension. Upper normal gallbladder thickness of 3 mm, but negative sonographic Murphy sign. If there is clinical concern for acute cholecystitis, consider nuclear medicine HIDA scan. 2. No biliary dilatation. 3. Limited sonographic assessment of the liver, grossly normal where visualized. Electronically Signed   By: Domique Rake M.D.   On: 08/31/2020 19:55    Assessment and Plan:   1.  Paroxysmal to persistent atrial fibrillation with additional bursts of atrial tachycardia.  Heart rate is in the low 100s this evening, predominantly in sinus rhythm.  CHA2DS2-VASc score is 5.  He is currently on IV heparin, transitioned from outpatient Eliquis in the setting of acute on chronic renal failure.  He was actually anticoagulated more for history of recurrent DVT and pulmonary embolus with no recent atrial fibrillation at least per chart review.  LVEF 60 to 65% by follow-up echocardiogram.  2.  Electrolyte abnormalities including hypomagnesemia and hypokalemia.  3.  Acute on chronic renal failure with CKD stage IV at baseline.  Present creatinine 6.76.  Dialysis is not planned.  He is followed by Nephrology.  4.  Possible aspiration pneumonia, currently on cefepime and vancomycin.  5.  Cholelithiasis with HIDA scan suggestive of cystic duct obstruction and acute cholecystitis.  6.  Palliative care discussion ongoing.  Patient currently DNR/DNI.  For now would start Lopressor 12.5 mg twice daily, he is taking p.o.'s and his blood pressure has been stable.  Could consider addition of amiodarone temporarily if rhythm remains uncontrolled, however his heart rate is reasonable this evening and he is not reporting any active symptoms.  Would replete magnesium and potassium levels.  As it  relates to acute cholecystitis, he is obviously a high risk candidate for surgery.  Signed, Rozann Lesches, MD  09/01/2020 7:18 PM

## 2020-09-01 NOTE — Consult Note (Signed)
Reason for Consult: gallbladder disease Referring Physician: Madison Hickman  Shannan Garfinkel is an 70 y.o. male.  HPI: 70 yo male admitted for AKI. During admission he has developed fevers, tachycardia going in and out of a fib with Rvr. Today he was able to eat a poor tasting pizza. He denies nausea. 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 The nurse noted it was more liquid like today.  Past Medical History:  Diagnosis Date  . Acute metabolic encephalopathy 1/60/1093  . Gastric ulcer   . Hyperlipemia   . Hypertension   . Hypospadias   . MI (myocardial infarction) ([place])   . Pulmonary embolism and infarction ([place]) 10/14/2017  . Stroke ([place])   . Tobacco abuse     Past Surgical History:  Procedure Laterality Date  . APPLICATION OF WOUND VAC N/A 09/04/2017   Procedure: APPLICATION OF WOUND VAC and Exploration of Abdomen.;  Surgeon: Elam Dutch, MD;  Location: [facility];  Service: Vascular;  Laterality: N/A;  . APPLICATION OF WOUND VAC N/A 09/06/2017   Procedure: APPLICATION OF WOUND VAC;  Surgeon: Georganna Skeans, MD;  Location: [facility];  Service: General;  Laterality: N/A;  . APPLICATION OF WOUND VAC N/A 09/08/2017   Procedure: APPLICATION OF WOUND VAC;  Surgeon: Judeth Horn, MD;  Location: [facility];  Service: General;  Laterality: N/A;  . APPLICATION OF WOUND VAC N/A 09/12/2017   Procedure: APPLICATION OF WOUND VAC  (CHANGE);  Surgeon: Judeth Horn, MD;  Location: [facility];  Service: General;  Laterality: N/A;  . APPLICATION OF WOUND VAC N/A 09/14/2017   Procedure: APPLICATION OF WOUND VAC;  Surgeon: Greer Pickerel, MD;  Location: [facility];  Service: General;  Laterality: N/A;  . APPLICATION OF WOUND VAC N/A 09/23/2017   Procedure: APPLICATION OF NEGATIVE PRESSURE THERAPY;  Surgeon: Kieth Brightly Arta Bruce, MD;  Location: [facility];  Service: General;  Laterality: N/A;  . BYPASS GRAFT AORTA TO AORTA  09/04/2017   Procedure:  Aorta to Superior Mesinteric aorta bypass ultrason Left common Femoral/;  Surgeon: Elam Dutch, MD;  Location: [facility];  Service: Vascular;;  . ESOPHAGOGASTRODUODENOSCOPY N/A 08/09/2014   Procedure: ESOPHAGOGASTRODUODENOSCOPY (EGD);  Surgeon: Jerene Bears, MD;  Location[facility];  Service: Gastroenterology;  Laterality: N/A;  . ESOPHAGOGASTRODUODENOSCOPY N/A 08/10/2014   Procedure: ESOPHAGOGASTRODUODENOSCOPY (EGD);  Surgeon: Jerene Bears, MD;  Location: [facility];  Service: Gastroenterology;  Laterality: N/A;  . ILEOSTOMY Right 09/06/2017   Procedure: POSSIBLE ILEOSTOMY;  Surgeon: Georganna Skeans, MD;  Location: [facility];  Service: General;  Laterality: Right;  . INSERTION OF MESH N/A 09/23/2017   Procedure: INSERTION OF MESH;  Surgeon: Sadeel Fiddler, Arta Bruce, MD;  Location: [facility];  Service: General;  Laterality: N/A;  . IR FLUORO GUIDE CV LINE RIGHT  10/04/2017  . IR US GUIDE VASC ACCESS RIGHT  10/04/2017  . LAPAROTOMY Right 09/04/2017   Procedure: EXPLORATORY LAPAROTOMY Right Colon Resection;  Surgeon: Elam Dutch, MD;  Location: [facility];  Service: Vascular;  Laterality: Right;  . LAPAROTOMY N/A 09/06/2017   Procedure: EXPLORATORY LAPAROTOMY;  Surgeon: Georganna Skeans, MD;  Location: [facility];  Service: General;  Laterality: N/A;  . LAPAROTOMY N/A 09/08/2017   Procedure: EXPLORATORY LAPAROTOMY, ABDOMINAL WASHOUT, PARTIAL CLOSURE OF ABDOMEN;  Surgeon: Judeth Horn, MD;  Location: [facility];  Service: General;  Laterality: N/A;  . LAPAROTOMY N/A 09/10/2017   Procedure: EXPLORATORY LAPAROTOMY WITH POSSIBLE WOUND CLOSURE  ABDOMEN;  Surgeon: Judeth Horn, MD;  Location: [facility];  Service: General;  Laterality: N/A;  . LAPAROTOMY N/A 09/12/2017   Procedure: EXPLORATORY LAPAROTOMY;  Surgeon: Judeth Horn, MD;  Location: [facility];  Service: General;  Laterality: N/A;  . LAPAROTOMY N/A 09/14/2017   Procedure: EXPLORATORY LAPAROTOMY;  Surgeon: Greer Pickerel, MD;  Location: [facility];  Service: General;  Laterality: N/A;   . LAPAROTOMY N/A 09/17/2017   Procedure: EXPLORATORY LAPAROTOMY AND PARTIAL CLOSURE OF ABDOMEN;  Surgeon: Judeth Horn, MD;  Location: [facility];  Service: General;  Laterality: N/A;  . LAPAROTOMY N/A 09/19/2017   Procedure: EXPLORATORY LAPAROTOMY & DRESSING CHANGE;  Surgeon: Coralie Keens, MD;  Location: [facility];  Service: General;  Laterality: N/A;  . LAPAROTOMY N/A 09/23/2017   Procedure: EXPLORATORY LAPAROTOMY, INCISIONAL HERNIA REPAIR WITH MESH INSERTION, PARTIAL CLOSURE OF SKIN;  Surgeon: Kieth Brightly, Arta Bruce, MD;  Location: [facility];  Service: General;  Laterality: N/A;  . TRACHEOSTOMY TUBE PLACEMENT N/A 09/12/2017   Procedure: TRACHEOSTOMY;  Surgeon: Judeth Horn, MD;  Location: [facility];  Service: General;  Laterality: N/A;  . VISCERAL ANGIOGRAM N/A 09/04/2017   Procedure: Non Selective MESENTERIC ANGIOGRAM,;  Surgeon: Elam Dutch, MD;  Location: [facility] OR;  Service: Vascular;  Laterality: N/A;    Family History  Problem Relation Age of Onset  . Pancreatic cancer Mother   . Heart attack Father 61    Social History:  reports that he quit smoking about 3 years ago. His smoking use included cigarettes. He has a 100.00 pack-year smoking history. He has never used smokeless tobacco. He reports current alcohol use. He reports that he does not use drugs.  Allergies: No Known Allergies  Medications: I have reviewed the patient's current medications.  Results for orders placed or performed during the hospital encounter of 08/30/20 (from the past 48 hour(s))  HIV Antibody (routine testing w rflx)     Status: None   Collection Time: 08/30/20  9:17 PM  Result Value Ref Range   HIV Screen 4th Generation wRfx Non Reactive Non Reactive    Comment: Performed at [facility], [place] [address], [place], [place] [address]  Troponin I (High Sensitivity)     Status: Abnormal   Collection Time: 08/30/20  9:17 PM  Result Value Ref Range   Troponin I (High Sensitivity) 869 (HH) <18 ng/L    Comment:  CRITICAL VALUE NOTED.  VALUE IS CONSISTENT WITH PREVIOUSLY REPORTED AND CALLED VALUE. (NOTE) Elevated high sensitivity troponin I (hsTnI) values and significant  changes across serial measurements may suggest ACS but many other  chronic and acute conditions are known to elevate hsTnI results.  Refer to the Links section for chest pain algorithms and additional  guidance. Performed at [facility], [place] [address], [place], [place] [address]   CK     Status: Abnormal   Collection Time: 08/30/20  9:17 PM  Result Value Ref Range   Total CK 1,150 (H) 49 - 397 U/L    Comment: Performed at [facility], [place] [address] [place], [place] [address]  Basic metabolic panel     Status: Abnormal   Collection Time: 08/30/20 10:07 PM  Result Value Ref Range   Sodium 139 135 - 145 mmol/L   Potassium 3.8 3.5 - 5.1 mmol/L   Chloride 111 98 - 111 mmol/L   CO2 12 (L) 22 - 32 mmol/L   Glucose, Bld 80 70 - 99 mg/dL    Comment: Glucose  reference range applies only to samples taken after fasting for at least 8 hours.   BUN 75 (H) 8 - 23 mg/dL   Creatinine, Ser 7.83 (H) 0.61 - 1.24 mg/dL   Calcium 7.4 (L) 8.9 - 10.3 mg/dL   GFR, Estimated 7 (L) >60 mL/min    Comment: (NOTE) Calculated using the CKD-EPI Creatinine Equation (2021)    Anion gap 16 (H) 5 - 15    Comment: Performed at [facility] [address] [place], [place] [address]  Troponin I (High Sensitivity)     Status: Abnormal   Collection Time: 08/30/20 11:18 PM  Result Value Ref Range   Troponin I (High Sensitivity) 827 (HH) <18 ng/L    Comment: CRITICAL VALUE NOTED.  VALUE IS CONSISTENT WITH PREVIOUSLY REPORTED AND CALLED VALUE. (NOTE) Elevated high sensitivity troponin I (hsTnI) values and significant  changes across serial measurements may suggest ACS but many other  chronic and acute conditions are known to elevate hsTnI results.  Refer to the Links section for chest pain algorithms and additional   guidance. Performed at [facility], [place] [address] [place], [place] [address]   Comprehensive metabolic panel     Status: Abnormal   Collection Time: 08/31/20  6:45 AM  Result Value Ref Range   Sodium 138 135 - 145 mmol/L   Potassium 3.2 (L) 3.5 - 5.1 mmol/L   Chloride 107 98 - 111 mmol/L   CO2 17 (L) 22 - 32 mmol/L   Glucose, Bld 90 70 - 99 mg/dL    Comment: Glucose reference range applies only to samples taken after fasting for at least 8 hours.   BUN 75 (H) 8 - 23 mg/dL   Creatinine, Ser 7.45 (H) 0.61 - 1.24 mg/dL   Calcium 7.3 (L) 8.9 - 10.3 mg/dL   Total Protein 5.5 (L) 6.5 - 8.1 g/dL   Albumin 2.4 (L) 3.5 - 5.0 g/dL   AST 63 (H) 15 - 41 U/L   ALT 46 (H) 0 - 44 U/L   Alkaline Phosphatase 200 (H) 38 - 126 U/L   Total Bilirubin 1.6 (H) 0.3 - 1.2 mg/dL   GFR, Estimated 7 (L) >60 mL/min    Comment: (NOTE) Calculated using the CKD-EPI Creatinine Equation (2021)    Anion gap 14 5 - 15    Comment: Performed at [facility], [place] [address], [place], [place] [address]  CBC     Status: Abnormal   Collection Time: 08/31/20  6:45 AM  Result Value Ref Range   WBC 5.6 4.0 - 10.5 K/uL   RBC 3.28 (L) 4.22 - 5.81 MIL/uL   Hemoglobin 9.8 (L) 13.0 - 17.0 g/dL   HCT 30.0 (L) 39.0 - 52.0 %   MCV 91.5 80.0 - 100.0 fL   MCH 29.9 26.0 - 34.0 pg   MCHC 32.7 30.0 - 36.0 g/dL   RDW 14.6 11.5 - 15.5 %   Platelets 128 (L) 150 - 400 K/uL   nRBC 0.0 0.0 - 0.2 %    Comment: Performed at [facility], [place] [address], [place], [place] [address]  Heparin level (unfractionated)     Status: Abnormal   Collection Time: 08/31/20  6:45 AM  Result Value Ref Range   Heparin Unfractionated >1.10 (H) 0.30 - 0.70 IU/mL    Comment: (NOTE) The clinical reportable range upper limit is being lowered to >1.10 to align with the FDA approved guidance for the current laboratory assay.  If heparin results are below expected values, and patient dosage has  been confirmed, suggest follow up  testing of antithrombin III levels. Performed at [facility], [place] [address], [place], [place] [address]   APTT     Status: Abnormal   Collection Time: 08/31/20  6:45 AM  Result Value Ref Range   aPTT >200 (HH) 24 - 36 seconds    Comment:        IF BASELINE aPTT IS ELEVATED, SUGGEST PATIENT RISK ASSESSMENT BE USED TO DETERMINE APPROPRIATE ANTICOAGULANT THERAPY. REPEATED TO VERIFY CRITICAL RESULT CALLED TO, READ BACK BY AND VERIFIED WITH: BERNARDO,M RN @ [phone] 08/31/20 LEONARD,A Performed at [facility], [place] [address] [place], [place] [address]   Lipase, blood     Status: None   Collection Time: 08/31/20  6:45 AM  Result Value Ref Range   Lipase 33 11 - 51 U/L    Comment: Performed at [facility] [address], [place], [place] [address]  Magnesium     Status: Abnormal   Collection Time: 08/31/20  6:45 AM  Result Value Ref Range   Magnesium 1.4 (L) 1.7 - 2.4 mg/dL    Comment: Performed at [facility] [address], [place], [place] [address]  Phosphorus     Status: Abnormal   Collection Time: 08/31/20  6:45 AM  Result Value Ref Range   Phosphorus 8.0 (H) 2.5 - 4.6 mg/dL    Comment: Performed at [facility] [address], [place], [place] [address]  Blood gas, arterial     Status: Abnormal   Collection Time: 08/31/20  8:13 AM  Result Value Ref Range   FIO2 36.00    pH, Arterial 7.297 (L) 7.350 - 7.450   pCO2 arterial 33.3 32.0 - 48.0 mmHg   pO2, Arterial 89.8 83.0 - 108.0 mmHg   Bicarbonate 15.8 (L) 20.0 - 28.0 mmol/L   Acid-base deficit 9.4 (H) 0.0 - 2.0 mmol/L   O2 Saturation 96.7 %   Patient temperature 37.0    Collection site RIGHT RADIAL    Drawn by [id number]     Comment: COLLECTED BY RT   Sample type ARTERIAL DRAW    Allens test (pass/fail) PASS PASS    Comment: Performed at [facility], [place] [address] [place], [place] [address]  Procalcitonin - Baseline     Status: None   Collection Time: 08/31/20 12:01 PM  Result  Value Ref Range   Procalcitonin 1.84 ng/mL    Comment:        Interpretation: PCT > 0.5 ng/mL and <= 2 ng/mL: Systemic infection (sepsis) is possible, but other conditions are known to elevate PCT as well. (NOTE)       Sepsis PCT Algorithm           Lower Respiratory Tract                                      Infection PCT Algorithm    ----------------------------     ----------------------------         PCT < 0.25 ng/mL                PCT < 0.10 ng/mL          Strongly encourage             Strongly discourage   discontinuation of  antibiotics    initiation of antibiotics    ----------------------------     -----------------------------       PCT 0.25 - 0.50 ng/mL            PCT 0.10 - 0.25 ng/mL               OR       >80% decrease in PCT            Discourage initiation of                                            antibiotics      Encourage discontinuation           of antibiotics    ----------------------------     -----------------------------         PCT >= 0.50 ng/mL              PCT 0.26 - 0.50 ng/mL                AND       <80% decrease in PCT             Encourage initiation of                                             antibiotics       Encourage continuation           of antibiotics    ----------------------------     -----------------------------        PCT >= 0.50 ng/mL                  PCT > 0.50 ng/mL               AND         increase in PCT                  Strongly encourage                                      initiation of antibiotics    Strongly encourage escalation           of antibiotics                                     -----------------------------                                           PCT <= 0.25 ng/mL                                                 OR                                        >  80% decrease in PCT                                      Discontinue / Do not initiate                                              antibiotics  Performed at [facility], [place] [address], [place], [place] [address]   CBC with Differential/Platelet     Status: Abnormal   Collection Time: 08/31/20  1:47 PM  Result Value Ref Range   WBC 4.1 4.0 - 10.5 K/uL   RBC 3.45 (L) 4.22 - 5.81 MIL/uL   Hemoglobin 10.4 (L) 13.0 - 17.0 g/dL   HCT 30.6 (L) 39.0 - 52.0 %   MCV 88.7 80.0 - 100.0 fL   MCH 30.1 26.0 - 34.0 pg   MCHC 34.0 30.0 - 36.0 g/dL   RDW 14.6 11.5 - 15.5 %   Platelets 127 (L) 150 - 400 K/uL   nRBC 0.0 0.0 - 0.2 %   Neutrophils Relative % 97 %   Neutro Abs 4.0 1.7 - 7.7 K/uL   Lymphocytes Relative 0 %   Lymphs Abs 0.0 (L) 0.7 - 4.0 K/uL   Monocytes Relative 3 %   Monocytes Absolute 0.1 0.1 - 1.0 K/uL   Eosinophils Relative 0 %   Eosinophils Absolute 0.0 0.0 - 0.5 K/uL   Basophils Relative 0 %   Basophils Absolute 0.0 0.0 - 0.1 K/uL   WBC Morphology See Note     Comment: Increased Bands. >20% Bands  Mild Left Shift. 1 to 5% Metas and Myelos, Occ Pro Noted.  Toxic Granulation  Vaculated Neutrophils    nRBC 0 0 /100 WBC   Abs Immature Granulocytes 0.00 0.00 - 0.07 K/uL    Comment: Performed at [facility], [place] [address] [place], [place] [address]  Lactic acid, plasma     Status: None   Collection Time: 08/31/20  1:47 PM  Result Value Ref Range   Lactic Acid, Venous 1.6 0.5 - 1.9 mmol/L    Comment: Performed at [facility] [address], [place], [place] [address]  MRSA PCR Screening     Status: Abnormal   Collection Time: 08/31/20  2:14 PM   Specimen: Nasopharyngeal  Result Value Ref Range   MRSA by PCR POSITIVE (A) NEGATIVE    Comment:        The GeneXpert MRSA Assay (FDA approved for NASAL specimens only), is one component of a comprehensive MRSA colonization surveillance program. It is not intended to diagnose MRSA infection nor to guide or monitor treatment for MRSA infections. RESULT CALLED TO, READ BACK BY AND VERIFIED WITH: RN Leeanne Mannan [phone]  FCP Performed at [facility], [place] [address], [place], [place] [address]   Legionella Pneumophila Serogp 1 Ur Ag     Status: None   Collection Time: 08/31/20  2:21 PM  Result Value Ref Range   L. pneumophila Serogp 1 Ur Ag Negative Negative    Comment: (NOTE) Presumptive negative for L. pneumophila serogroup 1 antigen in urine, suggesting no recent or current infection. Legionnaires' disease cannot be ruled out since other serogroups and species may also cause disease. Performed At: [facility]  [place], [place] [id number] Rush Farmer MD [id number]    Source of Sample URINE, RANDOM     Comment: Performed at [facility], [place] [address], [place], [place] [address]  Urine rapid drug screen (hosp performed)     Status: Abnormal   Collection Time: 08/31/20  2:47 PM  Result Value Ref Range   Opiates POSITIVE (A) NONE DETECTED   Cocaine NONE DETECTED NONE DETECTED   Benzodiazepines NONE DETECTED NONE DETECTED   Amphetamines NONE DETECTED NONE DETECTED   Tetrahydrocannabinol NONE DETECTED NONE DETECTED   Barbiturates NONE DETECTED NONE DETECTED    Comment: (NOTE) DRUG SCREEN FOR MEDICAL PURPOSES ONLY.  IF CONFIRMATION IS NEEDED FOR ANY PURPOSE, NOTIFY LAB WITHIN 5 DAYS.  LOWEST DETECTABLE LIMITS FOR URINE DRUG SCREEN Drug Class                     Cutoff (ng/mL) Amphetamine and metabolites    1000 Barbiturate and metabolites    200 Benzodiazepine                 397 Tricyclics and metabolites     300 Opiates and metabolites        300 Cocaine and metabolites        300 THC                            50 Performed at [facility], [place] [address], [place], [place] [address]   Heparin level (unfractionated)     Status: Abnormal   Collection Time: 08/31/20  6:51 PM  Result Value Ref Range   Heparin Unfractionated >1.10 (H) 0.30 - 0.70 IU/mL    Comment: (NOTE) The clinical reportable range upper limit is being lowered to >1.10 to  align with the FDA approved guidance for the current laboratory assay.  If heparin results are below expected values, and patient dosage has  been confirmed, suggest follow up testing of antithrombin III levels. Performed at [facility], [place] [address] [place], [place] [address]   APTT     Status: Abnormal   Collection Time: 08/31/20  6:51 PM  Result Value Ref Range   aPTT >200 (HH) 24 - 36 seconds    Comment:        IF BASELINE aPTT IS ELEVATED, SUGGEST PATIENT RISK ASSESSMENT BE USED TO DETERMINE APPROPRIATE ANTICOAGULANT THERAPY. REPEATED TO VERIFY CRITICAL RESULT CALLED TO, READ BACK BY AND VERIFIED WITHFrankey Poot RN [phone] [id number] K FORSYTH Performed at [facility], [place] [address], [place], [place] [address]   Hepatitis panel, acute     Status: None   Collection Time: 08/31/20  6:51 PM  Result Value Ref Range   Hepatitis B Surface Ag NON REACTIVE NON REACTIVE   HCV Ab NON REACTIVE NON REACTIVE    Comment: (NOTE) Nonreactive HCV antibody screen is consistent with no HCV infections,  unless recent infection is suspected or other evidence exists to indicate HCV infection.     Hep A IgM NON REACTIVE NON REACTIVE   Hep B C IgM NON REACTIVE NON REACTIVE    Comment: Performed at [facility], [place] [address] [place] [address]  CBC     Status: Abnormal   Collection Time: 09/01/20  5:18 AM  Result Value Ref Range   WBC 8.4 4.0 - 10.5 K/uL   RBC 3.16 (L) 4.22 - 5.81  MIL/uL   Hemoglobin 9.5 (L) 13.0 - 17.0 g/dL   HCT 28.1 (L) 39.0 - 52.0 %   MCV 88.9 80.0 - 100.0 fL   MCH 30.1 26.0 - 34.0 pg   MCHC 33.8 30.0 - 36.0 g/dL   RDW 14.8 11.5 - 15.5 %   Platelets 125 (L) 150 - 400 K/uL   nRBC 0.0 0.0 - 0.2 %    Comment: Performed at [facility] [address] [place], [place] [address]  Heparin level (unfractionated)     Status: Abnormal   Collection Time: 09/01/20  5:18 AM  Result Value Ref Range   Heparin Unfractionated 0.77 (H) 0.30 -  0.70 IU/mL    Comment: (NOTE) The clinical reportable range upper limit is being lowered to >1.10 to align with the FDA approved guidance for the current laboratory assay.  If heparin results are below expected values, and patient dosage has  been confirmed, suggest follow up testing of antithrombin III levels. Performed at [facility], [place] [address], [place], [place] [address] CORRECTED ON 05/13 AT 9678: PREVIOUSLY REPORTED AS 0.77   APTT     Status: Abnormal   Collection Time: 09/01/20  5:18 AM  Result Value Ref Range   aPTT 165 (HH) 24 - 36 seconds    Comment:        IF BASELINE aPTT IS ELEVATED, SUGGEST PATIENT RISK ASSESSMENT BE USED TO DETERMINE APPROPRIATE ANTICOAGULANT THERAPY. REPEATED TO VERIFY CRITICAL RESULT CALLED TO, READ BACK BY AND VERIFIED WITH: Quinn Axe, RN 09/01/2020 [id number] Performed at [facility] [address], [place], [place] [address]   Magnesium     Status: Abnormal   Collection Time: 09/01/20  5:18 AM  Result Value Ref Range   Magnesium 1.6 (L) 1.7 - 2.4 mg/dL    Comment: Performed at [facility] [address], [place], [place] [address]  Phosphorus     Status: Abnormal   Collection Time: 09/01/20  5:18 AM  Result Value Ref Range   Phosphorus 6.3 (H) 2.5 - 4.6 mg/dL    Comment: Performed at [facility] [address], [place], [place] [address]  Comprehensive metabolic panel     Status: Abnormal   Collection Time: 09/01/20  8:44 AM  Result Value Ref Range   Sodium 135 135 - 145 mmol/L   Potassium 2.9 (L) 3.5 - 5.1 mmol/L   Chloride 100 98 - 111 mmol/L   CO2 18 (L) 22 - 32 mmol/L   Glucose, Bld 102 (H) 70 - 99 mg/dL    Comment: Glucose reference range applies only to samples taken after fasting for at least 8 hours.   BUN 81 (H) 8 - 23 mg/dL   Creatinine, Ser 7.14 (H) 0.61 - 1.24 mg/dL   Calcium 6.9 (L) 8.9 - 10.3 mg/dL   Total Protein 5.2 (L) 6.5 - 8.1 g/dL   Albumin 1.9 (L) 3.5 - 5.0 g/dL   AST  56 (H) 15 - 41 U/L   ALT 37 0 - 44 U/L   Alkaline Phosphatase 131 (H) 38 - 126 U/L   Total Bilirubin 1.2 0.3 - 1.2 mg/dL   GFR, Estimated 8 (L) >60 mL/min    Comment: (NOTE) Calculated using the CKD-EPI Creatinine Equation (2021)    Anion gap 17 (H) 5 - 15    Comment: Performed at [facility], [place] [address] [place],  [address]  Urine rapid drug screen (hosp  performed)     Status: Abnormal   Collection Time: 09/01/20 10:00 AM  Result Value Ref Range   Opiates POSITIVE (A) NONE DETECTED   Cocaine NONE DETECTED NONE DETECTED   Benzodiazepines NONE DETECTED NONE DETECTED   Amphetamines NONE DETECTED NONE DETECTED   Tetrahydrocannabinol NONE DETECTED NONE DETECTED   Barbiturates NONE DETECTED NONE DETECTED    Comment: (NOTE) DRUG SCREEN FOR MEDICAL PURPOSES ONLY.  IF CONFIRMATION IS NEEDED FOR ANY PURPOSE, NOTIFY LAB WITHIN 5 DAYS.  LOWEST DETECTABLE LIMITS FOR URINE DRUG SCREEN Drug Class                     Cutoff (ng/mL) Amphetamine and metabolites    1000 Barbiturate and metabolites    200 Benzodiazepine                 546 Tricyclics and metabolites     300 Opiates and metabolites        300 Cocaine and metabolites        300 THC                            50 Performed at [facility], [place] [address], [place], [place] [address]   Heparin level (unfractionated)     Status: None   Collection Time: 09/01/20  6:27 PM  Result Value Ref Range   Heparin Unfractionated 0.52 0.30 - 0.70 IU/mL    Comment: (NOTE) The clinical reportable range upper limit is being lowered to >1.10 to align with the FDA approved guidance for the current laboratory assay.  If heparin results are below expected values, and patient dosage has  been confirmed, suggest follow up testing of antithrombin III levels. Performed at [facility], [place] [address] [place], [place] [address]   APTT     Status: Abnormal   Collection Time: 09/01/20  6:27 PM  Result Value Ref Range    aPTT 85 (H) 24 - 36 seconds    Comment:        IF BASELINE aPTT IS ELEVATED, SUGGEST PATIENT RISK ASSESSMENT BE USED TO DETERMINE APPROPRIATE ANTICOAGULANT THERAPY. Performed at [facility], [place] [address] [place], [place] [address]   Basic metabolic panel     Status: Abnormal   Collection Time: 09/01/20  6:27 PM  Result Value Ref Range   Sodium 131 (L) 135 - 145 mmol/L   Potassium 3.2 (L) 3.5 - 5.1 mmol/L   Chloride 99 98 - 111 mmol/L   CO2 17 (L) 22 - 32 mmol/L   Glucose, Bld 128 (H) 70 - 99 mg/dL    Comment: Glucose reference range applies only to samples taken after fasting for at least 8 hours.   BUN 84 (H) 8 - 23 mg/dL   Creatinine, Ser 6.76 (H) 0.61 - 1.24 mg/dL   Calcium 7.0 (L) 8.9 - 10.3 mg/dL   GFR, Estimated 8 (L) >60 mL/min    Comment: (NOTE) Calculated using the CKD-EPI Creatinine Equation (2021)    Anion gap 15 5 - 15    Comment: Performed at [facility] [address], [place], [place] [address]  CK     Status: Abnormal   Collection Time: 09/01/20  6:27 PM  Result Value Ref Range   Total CK 1,175 (H) 49 - 397 U/L    Comment: Performed at [facility], [place] [address] [place], [place] [address]  NM Hepato W/EF  Result Date: 09/01/2020 CLINICAL DATA:  In cholelithiasis with distended gallbladder and upper normal gallbladder wall thickness, question cholecystitis EXAM: NUCLEAR MEDICINE HEPATOBILIARY IMAGING TECHNIQUE: Sequential images of the abdomen were obtained out to 120 minutes following intravenous administration of radiopharmaceutical. RADIOPHARMACEUTICALS:  5 mCi Tc-63m  Choletec IV COMPARISON:  Ultrasound abdomen 08/31/2020 FINDINGS: Normal tracer extraction from bloodstream indicating normal hepatocellular function. Prompt excretion of tracer into biliary tree. Small bowel visualized at 41 minutes. At 1 hour gallbladder had not visualized. Additional 60 minutes of imaging performed. Gallbladder failed to visualize despite delayed  imaging. Findings are consistent with cystic duct obstruction and acute cholecystitis. IMPRESSION: Nonvisualization of the gallbladder at 2 hours consistent with acute cholecystitis. Electronically Signed   By: Lavonia Dana M.D.   On: 09/01/2020 16:54   DG CHEST PORT 1 VIEW  Result Date: 08/31/2020 CLINICAL DATA:  Shortness of breath EXAM: PORTABLE CHEST 1 VIEW COMPARISON:  08/30/2020 FINDINGS: Heart is normal size. Bibasilar opacities, new since prior study. No effusions or pneumothorax. Mild atherosclerotic calcifications. No acute bony abnormality. IMPRESSION: New bibasilar atelectasis or infiltrates. Electronically Signed   By: Rolm Baptise M.D.   On: 08/31/2020 08:35   ECHOCARDIOGRAM LIMITED  Result Date: 08/31/2020    ECHOCARDIOGRAM LIMITED REPORT   Patient Name:   Luke Kaufman Date of Exam: 08/31/2020 Medical Rec #:  [id number]       Height:       68.0 in Accession #:    [id number]      Weight:       204.5 lb Date of Birth:  02/20/1951       BSA:          2.063 m Patient Age:    72 years        BP:           135/77 mmHg Patient Gender: M               HR:           93 bpm. Exam Location:  Inpatient Procedure: Limited Echo, Limited Color Doppler and Cardiac Doppler Indications:    Dyspnea  History:        Patient has prior history of Echocardiogram examinations, most                 recent 11/12/2018. Previous Myocardial Infarction, Stroke; Risk                 Factors:Hypertension and Dyslipidemia.  Sonographer:    Luisa Hart RDCS Referring Phys: Princella Ion A HENSEL  Sonographer Comments: No parasternal window and Technically challenging study due to limited acoustic windows. Image acquisition challenging due to respiratory motion and Image acquisition challenging due to patient body habitus. IMPRESSIONS  1. Technically difficult echo with poor image quality.  2. Left ventricular ejection fraction, by estimation, is 60 to 65%. The left ventricle has normal function. The left ventricle has no  regional wall motion abnormalities. Left ventricular diastolic parameters are consistent with Grade I diastolic dysfunction (impaired relaxation).  3. Right ventricular systolic function is normal. The right ventricular size is normal. There is normal pulmonary artery systolic pressure.  4. The mitral valve was not well visualized. Trivial mitral valve regurgitation. No evidence of mitral stenosis. FINDINGS  Left Ventricle: Left ventricular ejection fraction, by estimation, is 60 to 65%. The left ventricle has normal function. The left ventricle has no regional wall motion abnormalities. The left ventricular internal cavity size was normal  in size. Left ventricular diastolic parameters are consistent with Grade I diastolic dysfunction (impaired relaxation). Right Ventricle: The right ventricular size is normal. Right vetricular wall thickness was not well visualized. Right ventricular systolic function is normal. There is normal pulmonary artery systolic pressure. The tricuspid regurgitant velocity is 2.44 m/s, and with an assumed right atrial pressure of 8 mmHg, the estimated right ventricular systolic pressure is 58.5 mmHg. Pericardium: Trivial pericardial effusion is present. Mitral Valve: The mitral valve was not well visualized. Trivial mitral valve regurgitation. No evidence of mitral valve stenosis. Tricuspid Valve: The tricuspid valve is not well visualized. Tricuspid valve regurgitation is trivial. Aortic Valve: Aortic valve mean gradient measures 7.0 mmHg. Aortic valve peak gradient measures 11.8 mmHg. Additional Comments: Technically difficult echo with poor image quality.  LV Volumes (MOD) LV vol d, MOD A2C: 56.6 ml LV vol d, MOD A4C: 102.0 ml LV vol s, MOD A2C: 27.4 ml LV vol s, MOD A4C: 61.7 ml LV SV MOD A2C:     29.2 ml LV SV MOD A4C:     102.0 ml LV SV MOD BP:      36.3 ml RIGHT VENTRICLE RV S prime:     17.10 cm/s TAPSE (M-mode): 2.2 cm LEFT ATRIUM             Index LA Vol (A2C):   42.6 ml 20.65  ml/m LA Vol (A4C):   33.6 ml 16.29 ml/m LA Biplane Vol: 39.3 ml 19.05 ml/m  AORTIC VALVE AV Vmax:           172.00 cm/s AV Vmean:          123.000 cm/s AV VTI:            0.285 m AV Peak Grad:      11.8 mmHg AV Mean Grad:      7.0 mmHg LVOT Vmax:         103.00 cm/s LVOT Vmean:        72.100 cm/s LVOT VTI:          0.213 m LVOT/AV VTI ratio: 0.75 MITRAL VALVE                TRICUSPID VALVE MV Area (PHT): 3.19 cm     TR Peak grad:   23.8 mmHg MV Decel Time: 238 msec     TR Vmax:        244.00 cm/s MR Peak grad: 128.6 mmHg MR Mean grad: 92.0 mmHg     SHUNTS MR Vmax:      567.00 cm/s   Systemic VTI: 0.21 m MR Vmean:     456.0 cm/s MV E velocity: 97.70 cm/s MV A velocity: 136.00 cm/s MV E/A ratio:  0.72 Mertie Moores MD Electronically signed by Mertie Moores MD Signature Date/Time: 08/31/2020/10:15:42 AM    Final    US Abdomen Limited RUQ (LIVER/GB)  Result Date: 08/31/2020 CLINICAL DATA:  Nausea and vomiting. EXAM: ULTRASOUND ABDOMEN LIMITED RIGHT UPPER QUADRANT COMPARISON:  Abdominal CT 10/26/2017 FINDINGS: Gallbladder: Distended containing intraluminal sludge and stones. Upper normal gallbladder thickness of 3 mm. No pericholecystic fluid. No sonographic Murphy sign noted by sonographer. Common bile duct: Diameter: 6 mm, normal Liver: Limited assessment due to difficulties with positioning. No obvious liver lesion is seen. Normal parenchymal echogenicity. Portal vein is patent on color Doppler imaging with normal direction of blood flow towards the liver. Other: No right upper quadrant ascites. IMPRESSION: 1. Gallstones with mild gallbladder distension. Upper normal gallbladder thickness of 3 mm, but  negative sonographic Murphy sign. If there is clinical concern for acute cholecystitis, consider nuclear medicine HIDA scan. 2. No biliary dilatation. 3. Limited sonographic assessment of the liver, grossly normal where visualized. Electronically Signed   By: Amadou Rake M.D.   On: 08/31/2020 19:55   Review  of Systems  All other systems reviewed and are negative.  PE Blood pressure 116/80, pulse (!) 115, temperature 98.6 F (37 C), temperature source Oral, resp. rate (!) 21, height 5\' 8"  (1.727 m), weight 97.6 kg, SpO2 92 %. Constitutional: NAD; conversant; no deformities Eyes: Moist conjunctiva; no lid lag; anicteric; PERRL Neck: Trachea midline; no thyromegaly Lungs: Normal respiratory effort; no tactile fremitus CV: a fib; no palpable thrills; no pitting edema GI: Abd left sided ileostomy patent and pink, no tenderness on exam, central portion of abdomen has a thin layer of epithelium overlying scar/matrix from absorbable mesh with some weeping; no palpable hepatosplenomegaly MSK: unable to assess gate; no clubbing/cyanosis Psychiatric: Appropriate affect; alert and oriented x3 Lymphatic: No palpable cervical or axillary lymphadenopathy Skin: No major subcutaneous nodules. Warm and dry   Assessment/Plan: 70 yo male with fevers earlier in hospitalization but none today, no leukocytosis, HIDA scan with non-filling of gallbladder. He is not focally tender in the area and has been eating classically greasy food today. He is not a surgical candidate both from cardiac risk and because of the catastrophic injury and repairs done in 2019. Each time I operated on him then I assumed he would die during the hospitalization. Surprisingly, he is still alive today. I discussed the findings with the patient. I gave the recommendation to continue with antibiotics for 7 days and avoid a cholecystostomy tube unless he begins to have symptoms of food intolerance, nausea, vomiting, or abdominal pain.  Arta Bruce Danikah Budzik 09/01/2020, 9:12 PM

## 2020-09-01 NOTE — Progress Notes (Signed)
FPTS Interim Progress Note  S: Patient evaluated at bedside this AM after receiving page from RN.  He was sleeping but was easily arousable to verbal stimuli.  Patient denies any new complaints and states " I am doing as great as I can".  He would like to continue same plan of care.  He remains tachycardic.  He again wishes to go to comfort care If he decompensates.  We will continue same plan of care. O: BP (!) 103/56 (BP Location: Right Arm)   Pulse (!) 110   Temp 98.5 F (36.9 C) (Oral)   Resp (!) 25   Ht 5\' 8"  (1.727 m)   Wt 215 lb 3.2 oz (97.6 kg)   SpO2 (!) 89%   BMI 32.72 kg/m      Honor Junes, MD 09/01/2020, 6:14 AM PGY-1, [facility] Service pager [phone]

## 2020-09-01 NOTE — Progress Notes (Addendum)
Family Medicine Teaching Service Daily Progress Note Intern Pager: [phone]  Patient name: Luke Kaufman Medical record number: [id number] Date of birth: 09/23/1950 Age: 70 y.o. Gender: male  Primary Care Provider: Rosaria Ferries, MD Consultants: Nephrology, Palliative Care, Pharmacy  Code Status: DNR/DNI    Pt Overview and Major Events to Date:  5/11- patient admitted  Assessment and Plan:  [name] is 70 yo male that presented with increasing weakness and SOB and admitted for Acute Renal Failure/Metabolic Acidosis in the setting of CKD Stage III. 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    Acute Renal Failure Metabolic Acidosis Scr 1.4>7.8>2.95>6.21>3.08, gradually improving. Basline 2-3 HCO3 13>12>17>18, acidosis is gradually improving, he has been on NaBicarb gtt since admission given acidosis. He still has minimal urinary output at 968mL (0.69ml/kg/hr) Nephrology is following and have discontinued IVF given fluid status.. We will continue to monitor renal function. --Nephrology following, recs appreciated --discontinue Sodium Bicarbonate gtt -Consider restartin home sodium bicarb --Strict I/Os   Possible Aspiration Pneumonia Patient became febrile yesterday afternoon, after CXR showed new bibasilar infiltrates. Fever resolved without intervention. Given that he he had 2 episodes of emesis yesterday, and new findings on CXR.Started on Cefepime and Vancomycin for empiric treatment. WBC is WNL although it has doubled in the last 24 hours.   He has remained afebrile overnight. He developed new oxygen requirement of 4-5L overnight. We will continue antibiotics and monitor labs and physical exam.  --continue abx with Cefepime and Vancomycin per Pharmacy consult  Hypokalemia Hypomagnesium Hypocalcemia Received 59mEq of K,K + was 2.9 this morning.  Hypokalemia most likely iatrogenic due to intracellular shift with Sodium Bicarb IVF. We will continue to replete. Magnesium Sulfate 2g was also replaced yesterday, we will recheck this morning. This morning, Ca2+ is  6.9, but corrected to 8.6 to Albumin of 1.9.  --KCl 75mEq in 114mL x4, may need to schedule it --AM CMP --Mag/Phos labs  Gallstones  RUQ showed Gallstones with mild gallbladder distension. WBC is WNL but has increased from 4.1 to 8.4 over the last 24hrs. Given that he remains afebrile and negative Murphy's sign, cholecystis is lower on the differential. Patient is scheduled for a HIDA scan this afternoon to rule out. We will also empirically treat with Flagyl. --HIDA scan scheduled --start Metronidazole  Atrial Fibrillation  He has been tachycardic since admission. EKGs have been remarkable for PACs, but no atrial fibrillation. He remains hemodynamically stable, and given acute renal failure and pressures we would like to avoid beta blockers. We will consult with Cardiology about arrhythmia and possible pharmacological therapies.  We will continue to monitor. He will need outpatient follow up with his Cardiologist --Cardiology consult  --continuous Tele-monitoring --EKG   Anemia of Chronic Disease, stable Hb 9.9>9.8>9.5 during admission. Transfusion threshold is 8. We will continue to monitor --AM CBC   Goals of Care Patient spoke with Palliative Care yesterday about wishes and continued goals of care.   Given patient's acute renal failure and wishes not receive any dialysis treatment, he changed code status to DNR/DNI and if he decompensates, he would like to return to [facility]. We will keep him updated and try to honor his wishes.    Hypertension BP this morning is 103/56. No home medications. We will continue to monitor --monitor vitals  Hx of DVT and Pulmonary Embolism Eliquis was held at admission. Anticogulation with  Heparin.  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 Patient denies any abdominal pain, non-tender to palpation on PE. Given  --bedside ostomy care   Hx. V. Fib Cardiac Arrest V.Fib arrest in 2019.  --tele-monitoring  Hyperlipidemia Taking atovostatin 80mg  at home and continued at admission --continue Atorvastatin 80mg  --continue cholestyramine 4g  Depression, chronic, stable Home medications include Sertaline --continue Sertaline 50mg   FEN/GI: Heart Healthy PPx: PO Protonix 40mg     Status is: Inpatient  Remains inpatient appropriate because:Inpatient level of care appropriate due to severity of illness   Dispo: The patient is from: SNF              Anticipated d/c is to: SNF              Patient currently is not medically stable to d/c.   Difficult to place patient No   Subjective:  [name] reports that he is feeling okay this morning. He reports that he was worried that he wouldn't make it overnight, but feels better this AM. He denies any pain and states that his breathing is at baseline. He denies any nausea or vomiting. He would like to continue with antibiotic therapy for now. He does not have any complaints today. Patient was also informed of UDS positive for opiates and denies any known use.   Objective: Temp:  [98.5 F (36.9 C)-101.7 F (38.7 C)] 98.5 F (36.9 C) (05/13 0400) Pulse Rate:  [106-143] 110 (05/13 0551) Resp:  [18-35] 25 (05/13 0551) BP: (69-160)/(51-119) 103/56 (05/13 0400) SpO2:  [88 %-98 %] 92 % (05/13 0649) Weight:  [97.6 kg] 97.6 kg (05/13 0400) Physical Exam: General: Sitting up in bed. SpO2 at 94% on 5L [place]. Alert and conversant this AM, decreased to 3 L nasal cannula and continued to remain in the 90s Cardiovascular: Tachycardic, heart sounds difficult to auscultate over breath sounds Respiratory: Mouthing breathing, O2 reduced to 3L, with SpO2. Course breath  sounds bilaterally Abdomen: Clean mid-line gauze bandage, colostomy bag with air and brown stool output. Normoactive bowel sounds. Soft, not distended, non-tender to palpation.  Extremities:Warm/Dry, cracked skin on lower extremities b/l. Trace but increased from previous exam LLE edema.   Laboratory: Recent Labs  Lab 08/31/20 0645 08/31/20 1347 09/01/20 0518  WBC 5.6 4.1 8.4  HGB 9.8* 10.4* 9.5*  HCT 30.0* 30.6* 28.1*  PLT 128* 127* 125*   Recent Labs  Lab 08/30/20 1241 08/30/20 2207 08/31/20 0645  NA 138 139 138  K 3.7 3.8 3.2*  CL 111 111 107  CO2 13* 12* 17*  BUN 73* 75* 75*  CREATININE 8.31* 7.83* 7.45*  CALCIUM 7.6* 7.4* 7.3*  PROT  --   --  5.5*  BILITOT  --   --  1.6*  ALKPHOS  --   --  200*  ALT  --   --  46*  AST  --   --  63*  GLUCOSE 88 80 90    Imaging/Diagnostic Tests: EXAM: ULTRASOUND ABDOMEN LIMITED RIGHT UPPER QUADRANT  COMPARISON:  Abdominal CT 10/26/2017  FINDINGS: Gallbladder:  Distended containing intraluminal sludge and stones. Upper normal gallbladder thickness of 3 mm. No pericholecystic fluid. No sonographic Murphy sign noted by sonographer.  Common bile duct:  Diameter: 6 mm, normal  Liver:  Limited assessment due to difficulties with positioning. No obvious liver lesion is seen. Normal parenchymal echogenicity. Portal vein is patent on color  Doppler imaging with normal direction of blood flow towards the liver.  Other: No right upper quadrant ascites.  IMPRESSION: 1. Gallstones with mild gallbladder distension. Upper normal gallbladder thickness of 3 mm, but negative sonographic Murphy sign. If there is clinical concern for acute cholecystitis, consider nuclear medicine HIDA scan. 2. No biliary dilatation. 3. Limited sonographic assessment of the liver, grossly normal where visualized.  Jacklynn Bue, Medical Student 09/01/2020, 7:22 AM MS4, [place] Intern pager: [phone], text pages  welcome  Resident Attestation  I saw and evaluated the patient, performing the key elements of the service.I  personally performed or re-performed the history, physical exam, and medical decision making activities of this service and have verified that the service and findings are accurately documented in the student's note. I developed the management plan that is described in the medical student's note, and I agree with the content, with my edits above.    Gifford Shave, PGY2

## 2020-09-01 NOTE — Progress Notes (Signed)
Late Entry   HIDA scan returned with findings suggestive of acute cholecystitis.  Paged general surgeon, Dr. Kieth Brightly, for formal consult. Will evaluate patient.

## 2020-09-01 NOTE — Progress Notes (Signed)
[place] for heparin Indication: hx of DVT and PE, will use therapeutic heparin for now  No Known Allergies  Patient Measurements: Height: 5\' 8"  (172.7 cm) Weight: 97.6 kg (215 lb 3.2 oz) IBW/kg (Calculated) : 68.4 Heparin Dosing Weight: 87 KG   Vital Signs: Temp: 98.5 F (36.9 C) (05/13 0400) Temp Source: Oral (05/13 0400) BP: 103/56 (05/13 0400) Pulse Rate: 110 (05/13 0551)  Labs: Recent Labs    08/30/20 1241 08/30/20 1425 08/30/20 2117 08/30/20 2207 08/30/20 2318 08/31/20 0645 08/31/20 1347 08/31/20 1851 09/01/20 0518  HGB 9.9*  --   --   --   --  9.8* 10.4*  --  9.5*  HCT 31.2*  --   --   --   --  30.0* 30.6*  --  28.1*  PLT 129*  --   --   --   --  128* 127*  --  125*  APTT  --   --   --   --   --  >200*  --  >200* 165*  HEPARINUNFRC  --   --   --   --   --  >1.10*  --  >1.10* 0.77*  CREATININE 8.31*  --   --  7.83*  --  7.45*  --   --   --   CKTOTAL  --   --  1,150*  --   --   --   --   --   --   TROPONINIHS 556* 802* 869*  --  827*  --   --   --   --    Estimated Creatinine Clearance: 10.5 mL/min (A) (by C-G formula based on SCr of 7.45 mg/dL (H)).  Medical History: Past Medical History:  Diagnosis Date  . Acute metabolic encephalopathy 10/19/5282  . Gastric ulcer   . Hyperlipemia   . Hypertension   . Hypospadias   . MI (myocardial infarction) ([place])   . Pulmonary embolism and infarction ([place]) 10/14/2017  . Stroke ([place])   . Tobacco abuse    Medications:  Apixaban 2.5 MG BID (last dose 5/11 0900 per nursing home admin)   Assessment: 57 yom from SNF with increased weakness and SOB. Patient has a PMH significant for CAD/MI in 2015, V. Fib arrest and A. Fib 2019, PVD, CVA, HTN, PE, and O2 at home during the night. Patient is on apixaban 2.5 MG BID with last known dose at 0900 on 5/11. On admit, CBC with Hgb 9.9, Plt 129. Scr>8 (not interested in dialysis in the past).   Heparin level slightly supratherapeutic (0.77)  on gtt at 800 units/hr. PTT 165 sec. Appears effects of apixaban have worn off and will utilize heparin levels from now on for monitoring. No bleeding noted.  Goal of Therapy:  Heparin level 0.3-0.7 units/ml Monitor platelets by anticoagulation protocol: Yes   Plan:  Decrease heparin to 700 units/h F/u 8hr heparin level D/c PTT  Sherlon Handing, PharmD, BCPS Please see amion for complete clinical pharmacist phone list 09/01/2020 6:52 AM

## 2020-09-02 ENCOUNTER — Inpatient Hospital Stay (HOSPITAL_COMMUNITY): Payer: Medicare Other

## 2020-09-02 DIAGNOSIS — N179 Acute kidney failure, unspecified: Secondary | ICD-10-CM | POA: Diagnosis not present

## 2020-09-02 DIAGNOSIS — R7401 Elevation of levels of liver transaminase levels: Secondary | ICD-10-CM | POA: Diagnosis not present

## 2020-09-02 DIAGNOSIS — R0600 Dyspnea, unspecified: Secondary | ICD-10-CM | POA: Diagnosis not present

## 2020-09-02 LAB — CBC
HCT: 27.2 % — ABNORMAL LOW (ref 39.0–52.0)
Hemoglobin: 9.1 g/dL — ABNORMAL LOW (ref 13.0–17.0)
MCH: 29.7 pg (ref 26.0–34.0)
MCHC: 33.5 g/dL (ref 30.0–36.0)
MCV: 88.9 fL (ref 80.0–100.0)
Platelets: 132 10*3/uL — ABNORMAL LOW (ref 150–400)
RBC: 3.06 MIL/uL — ABNORMAL LOW (ref 4.22–5.81)
RDW: 14.7 % (ref 11.5–15.5)
WBC: 10.7 10*3/uL — ABNORMAL HIGH (ref 4.0–10.5)
nRBC: 0 % (ref 0.0–0.2)

## 2020-09-02 LAB — COMPREHENSIVE METABOLIC PANEL
ALT: 43 U/L (ref 0–44)
AST: 80 U/L — ABNORMAL HIGH (ref 15–41)
Albumin: 1.8 g/dL — ABNORMAL LOW (ref 3.5–5.0)
Alkaline Phosphatase: 127 U/L — ABNORMAL HIGH (ref 38–126)
Anion gap: 15 (ref 5–15)
BUN: 87 mg/dL — ABNORMAL HIGH (ref 8–23)
CO2: 18 mmol/L — ABNORMAL LOW (ref 22–32)
Calcium: 7.1 mg/dL — ABNORMAL LOW (ref 8.9–10.3)
Chloride: 100 mmol/L (ref 98–111)
Creatinine, Ser: 6.54 mg/dL — ABNORMAL HIGH (ref 0.61–1.24)
GFR, Estimated: 9 mL/min — ABNORMAL LOW (ref >60)
Glucose, Bld: 107 mg/dL — ABNORMAL HIGH (ref 70–99)
Potassium: 3.4 mmol/L — ABNORMAL LOW (ref 3.5–5.1)
Sodium: 133 mmol/L — ABNORMAL LOW (ref 135–145)
Total Bilirubin: 0.9 mg/dL (ref 0.3–1.2)
Total Protein: 5.1 g/dL — ABNORMAL LOW (ref 6.5–8.1)

## 2020-09-02 LAB — MAGNESIUM: Magnesium: 1.7 mg/dL (ref 1.7–2.4)

## 2020-09-02 LAB — VANCOMYCIN, RANDOM: Vancomycin Rm: 20

## 2020-09-02 LAB — HEPARIN LEVEL (UNFRACTIONATED): Heparin Unfractionated: 0.48 IU/mL (ref 0.30–0.70)

## 2020-09-02 MED ORDER — VANCOMYCIN HCL 500 MG/100ML IV SOLN
500.0000 mg | Freq: Once | INTRAVENOUS | Status: AC
  Administered 2020-09-02: 500 mg via INTRAVENOUS
  Filled 2020-09-02: qty 100

## 2020-09-02 MED ORDER — POTASSIUM CHLORIDE CRYS ER 20 MEQ PO TBCR
40.0000 meq | EXTENDED_RELEASE_TABLET | ORAL | Status: AC
  Administered 2020-09-02 (×2): 40 meq via ORAL
  Filled 2020-09-02 (×2): qty 2

## 2020-09-02 MED ORDER — IPRATROPIUM-ALBUTEROL 0.5-2.5 (3) MG/3ML IN SOLN: 3.0000 mL | RESPIRATORY_TRACT | Status: DC | PRN

## 2020-09-02 MED ORDER — HYDROMORPHONE HCL 1 MG/ML IJ SOLN: 0.2500 mg | INTRAMUSCULAR | Status: DC | PRN

## 2020-09-02 MED ORDER — TAMSULOSIN HCL 0.4 MG PO CAPS
0.4000 mg | ORAL_CAPSULE | Freq: Every day | ORAL | Status: DC
  Administered 2020-09-02 – 2020-09-04 (×3): 0.4 mg via ORAL
  Filled 2020-09-02 (×3): qty 1

## 2020-09-02 NOTE — Progress Notes (Signed)
Subjective/Chief Complaint: No complaints of abdominal pain, no n/v, eating pancakes   Objective: Vital signs in last 24 hours: Temp:  [98.2 F (36.8 C)-98.9 F (37.2 C)] 98.5 F (36.9 C) (05/14 0734) Pulse Rate:  [85-117] 85 (05/14 0734) Resp:  [19-21] 21 (05/14 0734) BP: (102-126)/(52-94) 102/56 (05/14 0734) SpO2:  [90 %-93 %] 92 % (05/14 0734) Last BM Date: 09/01/20  Intake/Output from previous day: 05/13 0701 - 05/14 0700 In: 1907 [P.O.:720; I.V.:887; IV Piggyback:300] Out: 835 [Urine:735; Stool:100] Intake/Output this shift: No intake/output data recorded.  GI soft ileostomy functional, large hernia soft, no tenderness, no ruq tenderness  Lab Results:  Recent Labs    09/01/20 0518 09/02/20 0117  WBC 8.4 10.7*  HGB 9.5* 9.1*  HCT 28.1* 27.2*  PLT 125* 132*   BMET Recent Labs    09/01/20 1827 09/02/20 0117  NA 131* 133*  K 3.2* 3.4*  CL 99 100  CO2 17* 18*  GLUCOSE 128* 107*  BUN 84* 87*  CREATININE 6.76* 6.54*  CALCIUM 7.0* 7.1*   PT/INR No results for input(s): LABPROT, INR in the last 72 hours. ABG Recent Labs    08/30/20 1231 08/31/20 0813  PHART 7.191* 7.297*  HCO3 13.5* 15.8*    Studies/Results: NM Hepato W/EF  Result Date: 09/01/2020 CLINICAL DATA:  In cholelithiasis with distended gallbladder and upper normal gallbladder wall thickness, question cholecystitis EXAM: NUCLEAR MEDICINE HEPATOBILIARY IMAGING TECHNIQUE: Sequential images of the abdomen were obtained out to 120 minutes following intravenous administration of radiopharmaceutical. RADIOPHARMACEUTICALS:  5 mCi Tc-58m  Choletec IV COMPARISON:  Ultrasound abdomen 08/31/2020 FINDINGS: Normal tracer extraction from bloodstream indicating normal hepatocellular function. Prompt excretion of tracer into biliary tree. Small bowel visualized at 41 minutes. At 1 hour gallbladder had not visualized. Additional 60 minutes of imaging performed. Gallbladder failed to visualize despite delayed  imaging. Findings are consistent with cystic duct obstruction and acute cholecystitis. IMPRESSION: Nonvisualization of the gallbladder at 2 hours consistent with acute cholecystitis. Electronically Signed   By: Lavonia Dana M.D.   On: 09/01/2020 16:54   US Abdomen Limited RUQ (LIVER/GB)  Result Date: 08/31/2020 CLINICAL DATA:  Nausea and vomiting. EXAM: ULTRASOUND ABDOMEN LIMITED RIGHT UPPER QUADRANT COMPARISON:  Abdominal CT 10/26/2017 FINDINGS: Gallbladder: Distended containing intraluminal sludge and stones. Upper normal gallbladder thickness of 3 mm. No pericholecystic fluid. No sonographic Murphy sign noted by sonographer. Common bile duct: Diameter: 6 mm, normal Liver: Limited assessment due to difficulties with positioning. No obvious liver lesion is seen. Normal parenchymal echogenicity. Portal vein is patent on color Doppler imaging with normal direction of blood flow towards the liver. Other: No right upper quadrant ascites. IMPRESSION: 1. Gallstones with mild gallbladder distension. Upper normal gallbladder thickness of 3 mm, but negative sonographic Murphy sign. If there is clinical concern for acute cholecystitis, consider nuclear medicine HIDA scan. 2. No biliary dilatation. 3. Limited sonographic assessment of the liver, grossly normal where visualized. Electronically Signed   By: Brinley Rake M.D.   On: 08/31/2020 19:55    Anti-infectives: Anti-infectives (From admission, onward)   Start     Dose/Rate Route Frequency Ordered Stop   09/01/20 0900  metroNIDAZOLE (FLAGYL) IVPB 500 mg        500 mg 100 mL/hr over 60 Minutes Intravenous Every 8 hours 09/01/20 0801     08/31/20 1415  vancomycin (VANCOREADY) IVPB 2000 mg/400 mL        2,000 mg 200 mL/hr over 120 Minutes Intravenous  Once 08/31/20 1319 08/31/20 2045  08/31/20 1415  ceFEPIme (MAXIPIME) 2 g in sodium chloride 0.9 % 100 mL IVPB        2 g 200 mL/hr over 30 Minutes Intravenous Every 24 hours 08/31/20 1319     08/31/20 1343   vancomycin variable dose per unstable renal function (pharmacist dosing)         Does not apply See admin instructions 08/31/20 1343        Assessment/Plan: Cholelithiasis, ? Cholecystitis -I agree with Dr Kieth Brightly note and would treat with abx -he has no clinical evidence despite radiology of cholecystitis -would reserve perc chole as stated in his note  Rolm Bookbinder 09/02/2020

## 2020-09-02 NOTE — Progress Notes (Addendum)
Family Medicine Teaching Service Daily Progress Note Intern Pager: [phone]  Patient name: Luke Kaufman Medical record number: [id number] Date of birth: [birth year] Age: 70 y.o. Gender: male  Primary Care Provider: Rosaria Ferries, MD Consultants: Nephrology Code Status: DNR/DNI  Pt Overview and Major Events to Date:  5/11- patient admitted  5/12- Cefepime and Vancomycin started 5/13- Flagyl started, HIDA scan consistent with acute cholecystitis   Assessment and Plan:  [name] is 70 yo male that presented with increasing weakness and SOB and admitted for Acute Renal Failure/Metabolic Acidosis in the setting of CKD Stage IV. 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   Acute Renal Failure Metabolic Acidosis, improving Basline creatine is 2-3. Presented with Scr 8.1 and HCO3 of 13. This morning Scr is 6.54 and HCO3 is 18. His status is gradually improving, NaBicarb drip was stopped yesterday afternoon. UOP is 726mL overnight, we will remove foley catether to decrease infection risk and get a Bladder scan. We will also restart Flomax with goal of increased UOP as renal function improves. --discontinue Sodium Bicarbonate gtt -Consider restarting home sodium bicarb --Strict I/Os --remove foley catheter --Bladder U/S --restart Flomax 0.4mg  daily  Gallstones Acute Cholecystis  RUQ U/S showed Gallstones with mild gallbladder distension. HIDA scan was consistent  with acute cholecystis.  WBC has increased 4.1>8.4>10.7 over the last 48hrs. We started Flagyl yesterday. He remains afebrile. Gen Surgery consulted and determined that he is not a surgical candidate and recommended continued abx treatment for 7 days. We will continue to evaluate physical exam for changes and monitor labs.  --continue IV Metronidazole  500mg  Q8H   Sepsis 2/2 Aspiration  Pneumonia CXR on 5/11 showed new bibasilar infiltrates. Patient has remained afebrile over 48 hours. Started on Cefepime and Vancomycin for empiric treatment on 5/12. He is showing significant clinical improvement. We will discontinue Vancomycin as MRSA infection is unlikely and continue Cefepime, most likely switch to PO as we prepare for dispo.  --continue IV Cefepime  --consider switch to PO abx in 24-48hrs depending on clinical status/labs   Hypokalemia Hypomagnesium Hypocalcemia Since admission he has received 5 runs KCl since admission. K + was 3.4 this morning. Hypokalemia most likely iatrogenic due to intracellular shift with Sodium Bicarb IVF, and should most likely improve now that IVF have been stopped. We will continue to replete as needed .  This morning, Ca2+ is  7.1, but corrected to 8.5 to Albumin of 1.8. Magnesium was replaced yesterday and is 1.7, goal >2 , we will schedule Mag-ox 200mg  during admission --KCl 35mEq tablet x2 --AM CMP --Mag Oxide 200mg  BID --AM Mag    Atrial Fibrillation   History of PAF with RVR. CHA2DS2-VASc score is 5. EKGs have been remarkable for PACs and sinus tachycardia, but no atrial fibrillation. He remains hemodynamically stable. Cardiology following and recommendations are appreciated.  Cardiology recommended adding Metoprolol.  --Cardiology following, recs appreciated  --Metoprolol 12.5mg  BID  --continuous Tele-monitoring --EKG   Anemia of Chronic Disease, stable Hb 9.1 this morning and 9.9 on admission. Dropping Hbg could be iatrogenic due to blood draws, patient remains hemodynamically stable. we will continue to monitor.Transfusion threshold is 8. --AM CBC   Goals of Care Patient spoke with Palliative Care yesterday about wishes and continued goals of care.   Given patient's acute renal failure and wishes not receive any dialysis treatment, he changed code status to  DNR/DNI and if he decompensates, he would like to return to  [facility]. We will keep him updated and try to honor his wishes.    Hypertension BP this morning is 102/56. HR 85. He was started on Metoprolol yesterday per Cardiology recommendations. We will continue to monitor --monitor vitals   Hx of DVT and Pulmonary Embolism Eliquis was held at admission. Anticogulation with Heparin.  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 Patient denies any abdominal pain, non-tender to palpation on PE. --bedside ostomy care   Hx. V. Fib Cardiac Arrest V.Fib arrest in 2019. --tele-monitoring  Hyperlipidemia Taking atovostatin 80mg  at home and continued at admission. Given that his CK was elevated 1150 at admission and yesterday was 1175, we held Atorvastatin.  --hold Atorvastatin 80mg  --continue cholestyramine 4g  Depression, chronic, stable Home medications include Sertaline --continue Sertaline 50mg     FEN/GI: Heart healthy PPx: Prontonix 40mg     Status is: Inpatient  Remains inpatient appropriate because:IV treatments appropriate due to intensity of illness or inability to take PO and Inpatient level of care appropriate due to severity of illness   Dispo: The patient is from: SNF              Anticipated d/c is to: SNF              Patient currently is not medically stable to d/c.   Difficult to place patient No        Subjective:  [name] reports that he is doing well this morning. He denies any chest pain or SOB. He reports a brief period of nausea this morning that passed. No vomiting or abdominal pain. He inquires about discharge plans.   Objective: Temp:  [98.2 F (36.8 C)-98.9 F (37.2 C)] 98.5 F (36.9 C) (05/14 0734) Pulse Rate:  [85-117] 85 (05/14 0734) Resp:  [19-21] 21 (05/14 0734) BP: (102-126)/(52-94) 102/56 (05/14 0734) SpO2:  [90 %-93 %] 92 % (05/14 0734) Physical  Exam: General: Sitting up in bed. Awake, alert and conversant. Foley cath in place. 3L [place] Cardiovascular: RRR, no mumurs. Respiratory: Breathing well on 3LNC, continued when turned down to 2L. SpO2 remained 90-91%. Normal WOBCourse breath sounds bilaterally. Abdomen: Normoactive bowel sounds. Non-tender to palpation Extremities: Trace lower extremity edema.   Laboratory: Recent Labs  Lab 08/31/20 1347 09/01/20 0518 09/02/20 0117  WBC 4.1 8.4 10.7*  HGB 10.4* 9.5* 9.1*  HCT 30.6* 28.1* 27.2*  PLT 127* 125* 132*   Recent Labs  Lab 08/31/20 0645 09/01/20 0844 09/01/20 1827 09/02/20 0117  NA 138 135 131* 133*  K 3.2* 2.9* 3.2* 3.4*  CL 107 100 99 100  CO2 17* 18* 17* 18*  BUN 75* 81* 84* 87*  CREATININE 7.45* 7.14* 6.76* 6.54*  CALCIUM 7.3* 6.9* 7.0* 7.1*  PROT 5.5* 5.2*  --  5.1*  BILITOT 1.6* 1.2  --  0.9  ALKPHOS 200* 131*  --  127*  ALT 46* 37  --  43  AST 63* 56*  --  80*  GLUCOSE 90 102* 128* 107*    Imaging/Diagnostic Tests: EXAM: NUCLEAR MEDICINE HEPATOBILIARY IMAGING  TECHNIQUE: Sequential images of the abdomen were obtained out to 120 minutes following intravenous administration of radiopharmaceutical.  RADIOPHARMACEUTICALS:  5 mCi Tc-65m  Choletec IV  COMPARISON:  Ultrasound abdomen 08/31/2020  FINDINGS: Normal tracer extraction from bloodstream indicating normal hepatocellular function.  Prompt excretion of tracer  into biliary tree.  Small bowel visualized at 41 minutes.  At 1 hour gallbladder had not visualized.  Additional 60 minutes of imaging performed.  Gallbladder failed to visualize despite delayed imaging.  Findings are consistent with cystic duct obstruction and acute cholecystitis.  IMPRESSION: Nonvisualization of the gallbladder at 2 hours consistent with acute cholecystitis.  Jacklynn Bue, Medical Student 09/02/2020, 7:48 AM MS4, [place] Intern pager: [phone], text pages welcome  FPTS  Upper-Level Resident Addendum   I have independently interviewed and examined the patient. I have discussed the above with the original author and agree with their documentation.  Please see also any attending notes.    Carollee Leitz MD PGY-2, [place] [facility] 09/02/2020 9:05 PM  FPTS Service pager: [phone] (text pages welcome through [place])

## 2020-09-02 NOTE — Progress Notes (Addendum)
FPTS Interim Progress Note  CXR on 5/14 significant for worsening pneumonia in left lobe and bilateral pleural effusions.   S:Went to see paitent to evaluate respiratory status and suggest overnight BiPAP. Patient declined BiPAP and states breathing has not changed. States that he would like to go home soon. Patient also has not been eating breakfast or lunch, denies N/V/abdominal pain.   O: Breathing well on 2L [place]. Course breath sounds b/l. Respiratory exam unchanged.  A/P: We will continue to monitor respiratory status.   FPTS Upper-Level Resident Addendum   I have independently interviewed and examined the patient. I have discussed the above with the original author and agree with their documentation. Please see also any attending notes.    Carollee Leitz MD PGY-2, [place] [facility] 09/02/2020 9:06 PM  FPTS Service pager: [phone] (text pages welcome through [facility])

## 2020-09-02 NOTE — Progress Notes (Signed)
[place] for IV Heparin Indication: hx of DVT and PE, will use therapeutic heparin for now  No Known Allergies  Patient Measurements: Height: 5\' 8"  (172.7 cm) Weight: 97.6 kg (215 lb 3.2 oz) IBW/kg (Calculated) : 68.4 Heparin Dosing Weight: 87.7 kg   Vital Signs: Temp: 98.5 F (36.9 C) (05/14 0734) Temp Source: Axillary (05/14 0734) BP: 102/56 (05/14 0734) Pulse Rate: 85 (05/14 0734)  Labs: Recent Labs    08/30/20 1425 08/30/20 2117 08/30/20 2207 08/30/20 2318 08/31/20 0645 08/31/20 1347 08/31/20 1851 09/01/20 0518 09/01/20 0844 09/01/20 1827 09/02/20 0117  HGB  --   --   --   --    < > 10.4*  --  9.5*  --   --  9.1*  HCT  --   --   --   --    < > 30.6*  --  28.1*  --   --  27.2*  PLT  --   --   --   --    < > 127*  --  125*  --   --  132*  APTT  --   --   --   --    < >  --  >200* 165*  --  85*  --   HEPARINUNFRC  --   --   --   --    < >  --  >1.10* 0.77*  --  0.52 0.48  CREATININE  --   --    < >  --    < >  --   --   --  7.14* 6.76* 6.54*  CKTOTAL  --  1,150*  --   --   --   --   --   --   --  1,175*  --   TROPONINIHS 802* 869*  --  827*  --   --   --   --   --   --   --    < > = values in this interval not displayed.   Estimated Creatinine Clearance: 11.9 mL/min (A) (by C-G formula based on SCr of 6.54 mg/dL (H)).  Medical History: Past Medical History:  Diagnosis Date  . Acute metabolic encephalopathy 2/91/9166  . Gastric ulcer   . Hyperlipemia   . Hypertension   . Hypospadias   . MI (myocardial infarction) ([place])   . Pulmonary embolism and infarction ([place]) 10/14/2017  . Stroke ([place])   . Tobacco abuse    Medications:  Apixaban 2.5 MG BID (last dose 5/11 0900 per nursing home admin)   Assessment: 70 yr old man from SNF with increased weakness and SOB. Patient has a PMH significant for CAD/MI in 2015, V. Fib arrest and A. Fib 2019, PVD, CVA, HTN, PE, and O2 at home during the night. Patient was on apixaban 2.5 MG  BID PTA, with last known dose at 0900 on 5/11.   HL 0.48 (therapeutic) CBC low stable: Hgb 9.1, Plt 132  Goal of Therapy:  Heparin level 0.3-0.7 units/ml Monitor platelets by anticoagulation protocol: Yes   Plan:  Continue heparin infusion at 700 units/hr Monitor daily heparin level, CBC Monitor for bleeding F/u transition to oral anticoagulation as able/pending Willodean Rosenthal, PharmD PGY1 Pharmacy Resident 09/02/2020 7:50 AM

## 2020-09-02 NOTE — Progress Notes (Signed)
FPTS Interim Progress Note  S: Went to bedside to check in on patient with BP noted at 84/52. Patient reports feeling fine. He was sleeping on arrival to room, but is in and out of sleep watching TV.    O: BP (!) 89/55 (BP Location: Left Arm)   Pulse 83   Temp 97.6 F (36.4 C) (Axillary)   Resp 16   Ht 5\' 8"  (1.727 m)   Wt 97.6 kg   SpO2 96%   BMI 32.72 kg/m   General: non-toxic Lungs: breath sounds coarse. 80% on arrival to room with patient on 5L. Increased to 10L with improvement of O2 to 86% without evidence of respiratory distress.   A/P: BP improved with MAP of 72 on arrival to room. No changes in mental status.  Spoke to RN about increase in O2. Switching to HFNC. Continue to monitor O2 sats.  No other changes to plan at this time.   Wilber Oliphant, MD 09/02/2020, 11:53 PM PGY-3, [place] Medicine Service pager [phone]

## 2020-09-02 NOTE — Progress Notes (Signed)
Daily Progress Note   Patient Name: Luke Kaufman       Date: 09/02/2020 DOB: [birth year]  Age: 70 y.o. MRN#: [id number] Attending Physician: Lenoria Chime, MD Primary Care Physician: Rosaria Ferries, MD Admit Date: 08/30/2020  Reason for Consultation/Follow-up:  To discuss complex medical decision making related to patient's goals of care  Subjective: Met with Mr. Bagheri at bedside.  He seems reflective and very pleasant.  He has increased work of breathing but still chooses to chat with me.     When his time comes he wants to pass away at [facility].  They are his family.   We completed a MOST form together.  Mr. Coronado made the following choices:   Cardiopulmonary Resuscitation: Do Not Attempt Resuscitation (DNR/No CPR)  Medical Interventions: Comfort Measures: Keep clean, warm, and dry. Use medication by any route, positioning, wound care, and other measures to relieve pain and suffering. Use oxygen, suction and manual treatment of airway obstruction as needed for comfort. Do not transfer to the hospital unless comfort needs cannot be met in current location.  Antibiotics: Determine use of limitation of antibiotics when infection occurs  IV Fluids: IV fluids for a defined trial period  Feeding Tube: No feeding tube   We discussed returning to [facility] with either Palliative or Hospice support.   Mr. Kouba is familiar with [facility] and welcomes their support.   Assessment:   Patient with advanced renal disease and multiple co-morbidities (CAD, CVA, chronic hypoxic respiratory failure). Currently having significant work of breathing.   Patient Profile/HPI:  70 y.o.malewith past medical history of CAD/MIin 2015, PVD, CVAw/ L side weakness,  hypertension, hyperlipidemia, tobacco use, chronic hypoxic respiratory failure on oxygen at night, PUD with GI bleed, history of PE, v fib arrest in 2019 w/trach, andSMA occulusion s/p right hemicolectomy and ileostomy in 5/19admitted on 5/11/2022with weakness and shortness of breath.Diagnosed with acute renal failure - initial creatinine 8.31. Creatinine trending down. Some concern for pulmonary edema and respiratory failure. Also with a fib RVR. Procalcitonin found to be 1.84. PMT consulted to discuss [facility]   Length of Stay: 2   Vital Signs: BP (!) 102/56 (BP Location: Right Arm)   Pulse 85   Temp 98.5 F (36.9 C) (Axillary)   Resp (!) 21   Ht 5' 8" (  1.727 m)   Wt 97.6 kg   SpO2 92%   BMI 32.72 kg/m  SpO2: SpO2: 92 % O2 Device: O2 Device: Nasal Cannula O2 Flow Rate: O2 Flow Rate (L/min): 3 L/min       Palliative Assessment/Data:  20 - 30%     Palliative Care Plan    Recommendations/Plan:  MOST completed.  Please medically optimize and then discharge to [facility]  TOC order placed for patient to be followed by [facility] upon return to [facility]  Code Status:  DNR  Prognosis:   < 6 months.   Patient at high risk of acute decline and death.   Discharge Planning:  [place] with Hospice  Care plan was discussed with patient.  Thank you for allowing the Palliative Medicine Team to assist in the care of this patient.  Total time spent:  35 min.     Greater than 50%  of this time was spent counseling and coordinating care related to the above assessment and plan.  Florentina Jenny, PA-C Palliative Medicine  Please contact Palliative MedicineTeam phone at [phone] for questions and concerns between 7 am - 7 pm.   Please see AMION for individual provider pager numbers.

## 2020-09-02 NOTE — Progress Notes (Signed)
Pharmacy Antibiotic Note  Luke Kaufman is a 70 y.o. male admitted on 08/30/2020 with acute renal failure with concern for sepsis and intraabdominal infection.  Pharmacy has been consulted for vancomycin and cefepime dosing. This is day 3 of antibiotics. Patient also continues on metronidazole per MD. Per surgery, plan for 7 days of antibiotics with no surgical management.  CrCl 11.53ml/min and patient prefers no iHD.  WBC 10.7, afebrile  VR 20 on 5/14 at 0117  Plan: Vancomycin 500mg  IV x1 Cefepime 2g IV q24h - Monitor renal func for need to adjust abx. - Monitor clinical improvement, LOT, de-escalation as appropriate   Height: 5\' 8"  (172.7 cm) Weight: 97.6 kg (215 lb 3.2 oz) IBW/kg (Calculated) : 68.4  Temp (24hrs), Avg:98.5 F (36.9 C), Min:98.2 F (36.8 C), Max:98.9 F (37.2 C)  Recent Labs  Lab 08/30/20 1241 08/30/20 1249 08/30/20 2207 08/31/20 0645 08/31/20 1347 09/01/20 0518 09/01/20 0844 09/01/20 1827 09/02/20 0117  WBC 6.3  --   --  5.6 4.1 8.4  --   --  10.7*  CREATININE 8.31*  --  7.83* 7.45*  --   --  7.14* 6.76* 6.54*  LATICACIDVEN  --  0.7  --   --  1.6  --   --   --   --   VANCORANDOM  --   --   --   --   --   --   --   --  20    Estimated Creatinine Clearance: 11.9 mL/min (A) (by C-G formula based on SCr of 6.54 mg/dL (H)).    No Known Allergies  Antimicrobials this admission: Cefepime 5/12 >>  Vanc 5/12 >>  Metronidazole 5/13>>   Microbiology results: 5/12 BCx: NGTD 5/12 UCx: negative 5/12 MRSA: positive   Thank you for allowing pharmacy to be a part of this patient's care.  Wilson Singer, PharmD PGY1 Pharmacy Resident 09/02/2020 7:56 AM

## 2020-09-03 DIAGNOSIS — N179 Acute kidney failure, unspecified: Secondary | ICD-10-CM | POA: Diagnosis not present

## 2020-09-03 DIAGNOSIS — R7401 Elevation of levels of liver transaminase levels: Secondary | ICD-10-CM | POA: Diagnosis not present

## 2020-09-03 DIAGNOSIS — R0600 Dyspnea, unspecified: Secondary | ICD-10-CM | POA: Diagnosis not present

## 2020-09-03 DIAGNOSIS — R778 Other specified abnormalities of plasma proteins: Secondary | ICD-10-CM | POA: Diagnosis not present

## 2020-09-03 LAB — COMPREHENSIVE METABOLIC PANEL
ALT: 48 U/L — ABNORMAL HIGH (ref 0–44)
AST: 92 U/L — ABNORMAL HIGH (ref 15–41)
Albumin: 1.7 g/dL — ABNORMAL LOW (ref 3.5–5.0)
Alkaline Phosphatase: 172 U/L — ABNORMAL HIGH (ref 38–126)
Anion gap: 14 (ref 5–15)
BUN: 94 mg/dL — ABNORMAL HIGH (ref 8–23)
CO2: 17 mmol/L — ABNORMAL LOW (ref 22–32)
Calcium: 7.8 mg/dL — ABNORMAL LOW (ref 8.9–10.3)
Chloride: 103 mmol/L (ref 98–111)
Creatinine, Ser: 6.01 mg/dL — ABNORMAL HIGH (ref 0.61–1.24)
GFR, Estimated: 9 mL/min — ABNORMAL LOW (ref >60)
Glucose, Bld: 105 mg/dL — ABNORMAL HIGH (ref 70–99)
Potassium: 4.2 mmol/L (ref 3.5–5.1)
Sodium: 134 mmol/L — ABNORMAL LOW (ref 135–145)
Total Bilirubin: 0.9 mg/dL (ref 0.3–1.2)
Total Protein: 5.2 g/dL — ABNORMAL LOW (ref 6.5–8.1)

## 2020-09-03 LAB — CBC
HCT: 27.2 % — ABNORMAL LOW (ref 39.0–52.0)
Hemoglobin: 9 g/dL — ABNORMAL LOW (ref 13.0–17.0)
MCH: 29.8 pg (ref 26.0–34.0)
MCHC: 33.1 g/dL (ref 30.0–36.0)
MCV: 90.1 fL (ref 80.0–100.0)
Platelets: 121 10*3/uL — ABNORMAL LOW (ref 150–400)
RBC: 3.02 MIL/uL — ABNORMAL LOW (ref 4.22–5.81)
RDW: 14.8 % (ref 11.5–15.5)
WBC: 14.5 10*3/uL — ABNORMAL HIGH (ref 4.0–10.5)
nRBC: 0 % (ref 0.0–0.2)

## 2020-09-03 LAB — HEPARIN LEVEL (UNFRACTIONATED): Heparin Unfractionated: 0.2 IU/mL — ABNORMAL LOW (ref 0.30–0.70)

## 2020-09-03 LAB — MAGNESIUM: Magnesium: 1.9 mg/dL (ref 1.7–2.4)

## 2020-09-03 MED ORDER — SODIUM BICARBONATE 650 MG PO TABS
1300.0000 mg | ORAL_TABLET | Freq: Three times a day (TID) | ORAL | Status: DC
  Administered 2020-09-03 – 2020-09-04 (×6): 1300 mg via ORAL
  Filled 2020-09-03 (×6): qty 2

## 2020-09-03 MED ORDER — APIXABAN 2.5 MG PO TABS
2.5000 mg | ORAL_TABLET | Freq: Two times a day (BID) | ORAL | Status: DC
  Administered 2020-09-03 – 2020-09-04 (×4): 2.5 mg via ORAL
  Filled 2020-09-03 (×4): qty 1

## 2020-09-03 NOTE — Progress Notes (Signed)
ANTICOAGULATION CONSULT NOTE - Follow Up Consult  Pharmacy Consult for heparin Indication: h/o VTE  Labs: Recent Labs    08/31/20 1851 09/01/20 0518 09/01/20 0844 09/01/20 1827 09/02/20 0117 09/03/20 0243  HGB  --  9.5*  --   --  9.1* 9.0*  HCT  --  28.1*  --   --  27.2* 27.2*  PLT  --  125*  --   --  132* 121*  APTT >200* 165*  --  85*  --   --   HEPARINUNFRC >1.10* 0.77*  --  0.52 0.48 0.20*  CREATININE  --   --  7.14* 6.76* 6.54*  --   CKTOTAL  --   --   --  1,175*  --   --     Assessment: 70yo male subtherapeutic on heparin after two levels at goal, likely from Eliquis clearing.; no gtt issues or signs of bleeding per RN.  Goal of Therapy:  Heparin level 0.3-0.7 units/ml   Plan:  Will increase heparin gtt by 2 units/kg/hr to 900 units/hr and check level in 8 hours.    Wynona Neat, PharmD, BCPS  09/03/2020,4:01 AM

## 2020-09-03 NOTE — Progress Notes (Signed)
Subjective/Chief Complaint: No complaints of abdominal pain, no n/v  Objective: Vital signs in last 24 hours: Temp:  [97.6 F (36.4 C)-98.3 F (36.8 C)] 98.3 F (36.8 C) (05/15 0800) Pulse Rate:  [82-114] 114 (05/15 0800) Resp:  [15-20] 20 (05/15 0800) BP: (84-127)/(52-85) 127/85 (05/15 0800) SpO2:  [87 %-96 %] 96 % (05/15 0800) Last BM Date: 09/02/20  Intake/Output from previous day: 05/14 0701 - 05/15 0700 In: 731.4 [P.O.:240; I.V.:162; IV Piggyback:329.4] Out: 1375 [Urine:1075; Stool:300] Intake/Output this shift: No intake/output data recorded.  GI soft ileostomy functional, large hernia soft, no tenderness, no ruq tenderness  Lab Results:  Recent Labs    09/02/20 0117 09/03/20 0243  WBC 10.7* 14.5*  HGB 9.1* 9.0*  HCT 27.2* 27.2*  PLT 132* 121*   BMET Recent Labs    09/02/20 0117 09/03/20 0243  NA 133* 134*  K 3.4* 4.2  CL 100 103  CO2 18* 17*  GLUCOSE 107* 105*  BUN 87* 94*  CREATININE 6.54* 6.01*  CALCIUM 7.1* 7.8*   PT/INR No results for input(s): LABPROT, INR in the last 72 hours. ABG No results for input(s): PHART, HCO3 in the last 72 hours.  Invalid input(s): PCO2, PO2  Studies/Results: DG Chest 1 View  Result Date: 09/02/2020 CLINICAL DATA:  Shortness of breath. Follow-up bibasilar atelectasis and/or pneumonia. EXAM: PORTABLE CHEST 1 VIEW COMPARISON:  08/31/2020 and earlier, including CTA chest 10/01/2017. FINDINGS: Cardiac silhouette upper normal in size to slightly enlarged for AP portable technique, unchanged. Dense atelectasis in the RIGHT MIDDLE LOBE and RIGHT LOWER LOBE, new since the examination 2 days ago. Increasing patchy airspace opacities in the LEFT mid lung and LEFT lung base. BILATERAL pleural effusions are suspected. IMPRESSION: 1. New dense atelectasis involving the RIGHT MIDDLE LOBE and RIGHT LOWER LOBE since the examination 2 days ago. Query mucous plugging. 2. Worsening pneumonia involving the LEFT lung. 3. Probable  BILATERAL pleural effusions. Electronically Signed   By: Evangeline Dakin M.D.   On: 09/02/2020 13:53   NM Hepato W/EF  Result Date: 09/01/2020 CLINICAL DATA:  In cholelithiasis with distended gallbladder and upper normal gallbladder wall thickness, question cholecystitis EXAM: NUCLEAR MEDICINE HEPATOBILIARY IMAGING TECHNIQUE: Sequential images of the abdomen were obtained out to 120 minutes following intravenous administration of radiopharmaceutical. RADIOPHARMACEUTICALS:  5 mCi Tc-16m  Choletec IV COMPARISON:  Ultrasound abdomen 08/31/2020 FINDINGS: Normal tracer extraction from bloodstream indicating normal hepatocellular function. Prompt excretion of tracer into biliary tree. Small bowel visualized at 41 minutes. At 1 hour gallbladder had not visualized. Additional 60 minutes of imaging performed. Gallbladder failed to visualize despite delayed imaging. Findings are consistent with cystic duct obstruction and acute cholecystitis. IMPRESSION: Nonvisualization of the gallbladder at 2 hours consistent with acute cholecystitis. Electronically Signed   By: Lavonia Dana M.D.   On: 09/01/2020 16:54    Anti-infectives: Anti-infectives (From admission, onward)   Start     Dose/Rate Route Frequency Ordered Stop   09/02/20 1200  vancomycin (VANCOREADY) IVPB 500 mg/100 mL        500 mg 100 mL/hr over 60 Minutes Intravenous  Once 09/02/20 0841 09/02/20 2135   09/01/20 0900  metroNIDAZOLE (FLAGYL) IVPB 500 mg        500 mg 100 mL/hr over 60 Minutes Intravenous Every 8 hours 09/01/20 0801     08/31/20 1415  vancomycin (VANCOREADY) IVPB 2000 mg/400 mL        2,000 mg 200 mL/hr over 120 Minutes Intravenous  Once 08/31/20 1319 08/31/20 2045  08/31/20 1415  ceFEPIme (MAXIPIME) 2 g in sodium chloride 0.9 % 100 mL IVPB        2 g 200 mL/hr over 30 Minutes Intravenous Every 24 hours 08/31/20 1319     08/31/20 1343  vancomycin variable dose per unstable renal function (pharmacist dosing)  Status:  Discontinued          Does not apply See admin instructions 08/31/20 1343 09/02/20 1302      Assessment/Plan: Cholelithiasis, ? Cholecystitis -I agree with Dr Kieth Brightly note and would treat with abx -he has no clinical evidence despite radiology of cholecystitis -would reserve perc chole as stated in his note -I think he can leave from my standpoint on course of oral abx, per palliative note -he would never want surgery    Rolm Bookbinder 09/03/2020

## 2020-09-03 NOTE — Progress Notes (Signed)
Family Medicine Teaching Service Daily Progress Note Intern Pager: [phone]  Patient name: Luke Kaufman Medical record number: [id number] Date of birth: Nov 09, 1950 Age: 70 y.o. Gender: male  Primary Care Provider: Rosaria Ferries, MD Consultants: Nephrology Code Status: DNR/DNI  Pt Overview and Major Events to Date:  5/11- patient admitted  5/12- Cefepime and Vancomycin started 5/13- Flagyl started, HIDA scan consistent with acute cholecystitis   Assessment and Plan:  [name] is 70 yo male that presented with increasing weakness and SOB and admitted for Acute Renal Failure/Metabolic Acidosis in the setting of CKD Stage IV. 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   Acute on Chronic Renal Failure Metabolic Acidosis, improving Basline creatine is 2-3. Serum creatine 6 from 8.1 on admission.  Bicarb 17-18 in the past few days. UOP ~1.1 L yesterday. Per nephro, declined chronic HD.  --Nephrology signed off, appreciated recommendations  -Restart home sodium bicarb  --Discontinue Heparin and restart renally dosed Eliquis  --Strict I/Os --Flomax 0.4mg  daily  Gallstones  Acute Cholecystis  HIDA scan c/w acute cholecystitis. General surgeon determined he was not a surgical candidate and recommended continue antibiotics.  --Per Gen Surg: continue IV Metronidazole (5/13- ), Cefepime (5/12 -) --Discontinue Vancomycin  --Monitor abdominal exam for signs of acute abdomen  --AM CBC, CMP  Sepsis 2/2 Aspiration Pneumonia WBC trending up after discontining Vanc from 10.7 to 14.5. Will restart. CXR yesterday showing new right middle and lower lobe densities and worsening left lung pneumonia. Patient using 4-7 L of HFNC this morning as he O2 sats dropped to 87-89% this morning. Declined BiPAP. Considered low dose Lasix if BP allows.  --continue IV Cefepime   --consider switch to PO abx in 24-48hrs depending on clinical status/labs --Restart Vanc    Hypokalemia Hypomagnesium Hypocalcemia Potassium, corrected calcium and magnesium are WNL this morning.  --AM CMP, Mg  --Replete as needed    Atrial Fibrillation  Intermittently tachycardic. Held metoprolol last night due to hypotension. CHA2DS2-VASc score is 5. Restart Eliquis.  --Cardiology consulted, appreciate recommendations --Metoprolol 12.5mg  BID  --continuous Tele-monitoring --EKG   Anemia of Chronic Disease, stable Hgb 9.0 from 9.1 yesterday though declined overall since admission. Likely iatrogenic. --Transfusion threshold is 8. --AM CBC   Goals of Care Patient spoke with Palliative Care yesterday about wishes and continued goals of care.   Given patient's acute renal failure and wishes not receive any dialysis treatment, he changed code status to DNR/DNI and if he decompensates, he would like to return to [facility]. We will keep him updated and try to honor his wishes.    Hypertension Intermittently hypotensive last. Cardiology started Metoprolol. Held last night due to hypotension.  --monitor vitals   Hx of DVT and Pulmonary Embolism Discontinue heparin and restart Eliquis.   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 Patient denies any abdominal pain, non-tender to palpation on PE. --bedside ostomy care   Hx. V. Fib Cardiac Arrest --cardiac monitoring  Hyperlipidemia --hold home Atorvastatin 80mg  in setting of elevated CK --continue cholestyramine 4g  Depression, chronic, stable Home medications include Sertaline --continue Sertaline 50mg     FEN/GI: Heart healthy VTE PPx: Eliquis    Status is: Inpatient  Remains inpatient appropriate because:IV treatments appropriate due to intensity of illness or inability to take PO and Inpatient level of care  appropriate due to severity of illness   Dispo: The patient is from: SNF              Anticipated d/c is to: SNF              Patient currently is not medically stable to d/c.   Difficult to place patient No        Subjective:  Patient's O2 sats dropped to 87-89% and he was placed on HFNC. He denies change in his breathing and all pain this morning. He would like to return to his facility.   Objective: Temp:  [97.6 F (36.4 C)-98.3 F (36.8 C)] 98.3 F (36.8 C) (05/15 0800) Pulse Rate:  [82-114] 114 (05/15 0800) Resp:  [15-20] 20 (05/15 0800) BP: (84-127)/(52-85) 127/85 (05/15 0800) SpO2:  [87 %-96 %] 96 % (05/15 0800)  Physical Exam: GEN: pleasant male, sitting upright in bed  CV: regular rate and rhythm, no murmurs appreciated  RESP:  Diffuse rales and rhonchi bilaterally, tachypneic, no wheezing ABD: Bowel sounds present. Soft, Nontender, Nondistended.  MSK: no appreciable LE edema SKIN: warm, dry    Laboratory: Recent Labs  Lab 09/01/20 0518 09/02/20 0117 09/03/20 0243  WBC 8.4 10.7* 14.5*  HGB 9.5* 9.1* 9.0*  HCT 28.1* 27.2* 27.2*  PLT 125* 132* 121*   Recent Labs  Lab 09/01/20 0844 09/01/20 1827 09/02/20 0117 09/03/20 0243  NA 135 131* 133* 134*  K 2.9* 3.2* 3.4* 4.2  CL 100 99 100 103  CO2 18* 17* 18* 17*  BUN 81* 84* 87* 94*  CREATININE 7.14* 6.76* 6.54* 6.01*  CALCIUM 6.9* 7.0* 7.1* 7.8*  PROT 5.2*  --  5.1* 5.2*  BILITOT 1.2  --  0.9 0.9  ALKPHOS 131*  --  127* 172*  ALT 37  --  43 48*  AST 56*  --  80* 92*  GLUCOSE 102* 128* 107* 105*    Imaging/Diagnostic Tests:  DG Chest 1 View  Result Date: 09/02/2020 CLINICAL DATA:  Shortness of breath. Follow-up bibasilar atelectasis and/or pneumonia. EXAM: PORTABLE CHEST 1 VIEW COMPARISON:  08/31/2020 and earlier, including CTA chest 10/01/2017. FINDINGS: Cardiac silhouette upper normal in size to slightly enlarged for AP portable technique, unchanged. Dense atelectasis in the RIGHT MIDDLE LOBE  and RIGHT LOWER LOBE, new since the examination 2 days ago. Increasing patchy airspace opacities in the LEFT mid lung and LEFT lung base. BILATERAL pleural effusions are suspected. IMPRESSION: 1. New dense atelectasis involving the RIGHT MIDDLE LOBE and RIGHT LOWER LOBE since the examination 2 days ago. Query mucous plugging. 2. Worsening pneumonia involving the LEFT lung. 3. Probable BILATERAL pleural effusions. Electronically Signed   By: Evangeline Dakin M.D.   On: 09/02/2020 13:53     Lyndee Hensen, DO PGY-2, [place] [facility] 09/03/2020 8:50 AM  FPTS Service pager: [phone] (text pages welcome through [facility])

## 2020-09-03 NOTE — Progress Notes (Addendum)
[facility])  Referral received for hospice at [facility].  Met with pt in the room. Confirmed desire to dc back to his facility with hospice support.  He currently has in place: hospital bed, O2 and wheelchair. Does not need any additional DME to dc back to his facility.   [name] RN, BSN, [facility] Liaison   **approved for hospice at facility.

## 2020-09-03 NOTE — Plan of Care (Signed)

## 2020-09-03 NOTE — NC FL2 (Signed)
[place] LEVEL OF CARE SCREENING TOOL     IDENTIFICATION  Patient Name: Luke Kaufman Birthdate: 09-09-1950 Sex: male Admission Date (Current Location): 08/30/2020  [place] and [place] Number:  Kathleen Argue [phone] [place] and Address:  [facility] [place] [place] [address] [place], [place] [address]      Provider Number: [id number]  Attending Physician Name and Address:  Lenoria Chime, MD  Relative Name and Phone Number:  none listed    Current Level of Care: Hospital Recommended Level of Care: [place] Prior Approval Number:    Date Approved/Denied:   PASRR Number: [id number]  Discharge Plan: SNF    Current Diagnoses: Patient Active Problem List   Diagnosis Date Noted  . Dyspnea   . Acute renal failure (ARF) ([place]) 08/31/2020  . Transaminitis   . Non-intractable vomiting   . Metabolic acidosis 61/95/0932  . Acute kidney injury (AKI) with acute tubular necrosis (ATN) ([place]) 11/11/2018  . Upper airway cough syndrome 03/31/2018  . DOE (dyspnea on exertion) 03/30/2018  . 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 Acute renal failure ([place]) 10/14/2017  . Pulmonary embolism and infarction ([place]) 10/14/2017  . Acute metabolic encephalopathy 67/03/4579  . Cardiac arrest ([place]) 10/14/2017  . Non-STEMI (non-ST elevated myocardial infarction) ([place])   . Pulmonary embolus, left ([place])   . Acute deep vein thrombosis (DVT) of distal end of left lower extremity ([place])   . Acute deep vein thrombosis (DVT) of radial vein of right upper extremity ([place])   . Left hemiparesis ([place])   . Severe protein-calorie malnutrition ([place])   . Acute encephalopathy   . Elevated troponin   . Acute on chronic respiratory failure with hypoxia ([place])   . Cardiac arrest ([place])   . AKI (acute kidney injury) ([place])   . Acute respiratory failure with hypoxemia ([place])   . Respiratory failure ([place])   . Abdominal pain 09/04/2017  . PUD (peptic  ulcer disease) 09/04/2017  . Mesenteric ischemia ([place]) 09/04/2017  . 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 Left arm numbness 12/14/2015  . Numbness 12/14/2015  . Bilateral carotid artery stenosis 11/22/2015  . Recurrent major depressive disorder in partial remission ([place]) 10/19/2015  . Mitral valve prolapse 09/07/2015  . Vitamin D deficiency 08/31/2015  . Arterial atherosclerosis 04/10/2015  . Cataract 04/10/2015  . Cerebrovascular accident, old 04/10/2015  . Gonalgia 04/10/2015  . HLD (hyperlipidemia) 04/10/2015  . 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 Renal artery stenosis ([place]) 04/10/2015  . History of non-ST elevation myocardial infarction (NSTEMI) 04/10/2015  . Anemia associated with acute blood loss 08/24/2014  . Thrombocytopenia ([place])   . Hypokalemia   . Essential hypertension 08/09/2014  . Chest pain 08/09/2014  . Near syncope 08/09/2014  . CKD (chronic kidney disease) stage 3, GFR 30-59 ml/min (HCC) 08/09/2014  . Tobacco abuse 08/09/2014  . Upper GI bleeding   . Gastric ulcer with hemorrhage   . Acute blood loss anemia   . Cerebral infarction ([place]) 05/16/2013  . HTN (hypertension) 05/16/2013  . 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    Orientation RESPIRATION BLADDER Height & Weight     Self,Time,Situation,Place  O2 Continent Weight: 215 lb 3.2 oz (97.6 kg) Height:  5\' 8"  (172.7 cm)  BEHAVIORAL SYMPTOMS/MOOD NEUROLOGICAL BOWEL NUTRITION STATUS      Continent Diet (see discharge summary)  AMBULATORY STATUS COMMUNICATION OF NEEDS Skin   Total Care Verbally Other (Comment) (ecchymosis)  Personal Care Assistance Level of Assistance  Bathing,Feeding,Dressing,Total care       Total Care Assistance: Maximum assistance   Functional Limitations Info  Sight,Hearing,Speech Sight Info: Adequate Hearing Info: Adequate Speech Info: Adequate    SPECIAL CARE FACTORS FREQUENCY                        Contractures Contractures Info: Not present    Additional Factors Info  Code Status,Allergies Code Status Info: DNR Allergies Info: NKA           Current Medications (09/03/2020):  This is the current hospital active medication list Current Facility-Administered Medications  Medication Dose Route Frequency Provider Last Rate Last Admin  . ceFEPIme (MAXIPIME) 2 g in sodium chloride 0.9 % 100 mL IVPB  2 g Intravenous Q24H Thea Gist, RPH   Stopped at 09/02/20 2255  . Chlorhexidine Gluconate Cloth 2 % PADS 6 each  6 each Topical Daily Zenia Resides, MD   6 each at 09/02/20 [phone]  . cholestyramine (QUESTRAN) packet 4 g  4 g Oral Daily Gifford Shave, MD   4 g at 09/03/20 0905  . heparin ADULT infusion 100 units/mL (25000 units/238mL)  900 Units/hr Intravenous Continuous Laren Everts, RPH 9 mL/hr at 09/03/20 0415 900 Units/hr at 09/03/20 0415  . HYDROmorphone (DILAUDID) injection 0.25 mg  0.25 mg Intravenous Q4H PRN Dellinger, Haynes Dage L, PA-C      . ipratropium-albuterol (DUONEB) 0.5-2.5 (3) MG/3ML nebulizer solution 3 mL  3 mL Nebulization Q2H PRN Dellinger, Bobby Rumpf, PA-C      . metoprolol tartrate (LOPRESSOR) tablet 12.5 mg  12.5 mg Oral BID Satira Sark, MD   12.5 mg at 09/03/20 0858  . metroNIDAZOLE (FLAGYL) IVPB 500 mg  500 mg Intravenous Q8H Carollee Leitz, MD 100 mL/hr at 09/03/20 0859 500 mg at 09/03/20 0859  . mupirocin ointment (BACTROBAN) 2 % 1 application  1 application Nasal BID Zenia Resides, MD   1 application at 96/04/54 0859  . pantoprazole (PROTONIX) EC tablet 40 mg  40 mg Oral Daily Carollee Leitz, MD   40 mg at 09/03/20 0858  . sertraline (ZOLOFT) tablet 50 mg  50 mg Oral Daily Gifford Shave, MD   50 mg at 09/03/20 0859  . sodium bicarbonate tablet 1,300 mg  1,300 mg Oral TID Lyndee Hensen, DO      . tamsulosin (FLOMAX) capsule 0.4 mg  0.4 mg Oral Daily Carollee Leitz, MD   0.4 mg at 09/03/20 0981     Discharge Medications: Please see  discharge summary for a list of discharge medications.  Relevant Imaging Results:  Relevant Lab Results:   Additional Information SSN: [id number]  Joanne Chars, LCSW

## 2020-09-03 NOTE — TOC Initial Note (Signed)
Transition of Care [facility]) - Initial/Assessment Note    Patient Details  Name: Luke Kaufman MRN: [id number] Date of Birth: 10-Feb-1951  Transition of Care [facility]) CM/SW Contact:    Joanne Chars, LCSW Phone Number: 09/03/2020, 11:46 AM  Clinical Narrative:     CSW met with pt regarding return to [facility]  Pt confirms this choice, also confirms that he met with palliative care yesterday and would like to work with [facility] for hospice.  Pt reports he has no living relatives, his last sister died last year.  Pt is vaccinated for covid and boosted.  CSW updated him that will contact [facility] regarding his return when medically ready.   CSW spoke with [name] at [facility] and made referral.                Expected Discharge Plan: Skilled Nursing Facility Barriers to Discharge: Continued Medical Work up   Patient Goals and CMS Choice Patient states their goals for this hospitalization and ongoing recovery are:: "stay healthy" CMS Medicare.gov Compare Post Acute Care list provided to::  (NA-pt wanting to return to [employer] [place])    Expected Discharge Plan and Services Expected Discharge Plan: [place] In-house Referral: Clinical Social Work   Post Acute Care Choice: [place] Living arrangements for the past 2 months: [place]                                      Prior Living Arrangements/Services Living arrangements for the past 2 months: [place] Lives with:: Facility Resident Patient language and need for interpreter reviewed:: Yes Do you feel safe going back to the place where you live?: Yes      Need for Family Participation in Patient Care: No (Comment) Care giver support system in place?: Yes (comment) Current home services: Other (comment) (na) Criminal Activity/Legal Involvement Pertinent to Current Situation/Hospitalization: No - Comment as needed  Activities of  Daily Living Home Assistive Devices/Equipment: Ostomy supplies,Eyeglasses ADL Screening (condition at time of admission) Patient's cognitive ability adequate to safely complete daily activities?: No Is the patient deaf or have difficulty hearing?: No Does the patient have difficulty seeing, even when wearing glasses/contacts?: No Does the patient have difficulty concentrating, remembering, or making decisions?: No Patient able to express need for assistance with ADLs?: No Does the patient have difficulty dressing or bathing?: Yes Independently performs ADLs?: No Communication: Needs assistance Is this a change from baseline?: Pre-admission baseline Dressing (OT): Needs assistance Is this a change from baseline?: Pre-admission baseline Grooming: Needs assistance Is this a change from baseline?: Pre-admission baseline Feeding: Needs assistance Is this a change from baseline?: Pre-admission baseline Bathing: Dependent Toileting: Needs assistance Is this a change from baseline?: Pre-admission baseline In/Out Bed: Dependent Is this a change from baseline?: Pre-admission baseline Walks in Home: Dependent Is this a change from baseline?: Pre-admission baseline Does the patient have difficulty walking or climbing stairs?: Yes Weakness of Legs: Both Weakness of Arms/Hands: Right  Permission Sought/Granted Permission sought to share information with : Investment banker, corporate granted to share info w AGENCY: [employer], Gaffer        Emotional Assessment Appearance:: Appears stated age Attitude/Demeanor/Rapport: Engaged Affect (typically observed): Appropriate,Pleasant Orientation: : Oriented to Self,Oriented to Place,Oriented to  Time,Oriented to Situation Alcohol / Substance Use: Not Applicable Psych Involvement: No (comment)  Admission  diagnosis:  Azotemia [R79.89] Elevated troponin [R77.8] Acute renal failure (ARF) (HCC) [N17.9] Acute  renal failure, unspecified acute renal failure type [facility]) [N17.9] Patient Active Problem List   Diagnosis Date Noted  . Dyspnea   . Acute renal failure (ARF) ([place]) 08/31/2020  . Transaminitis   . Non-intractable vomiting   . Metabolic acidosis 38/88/2800  . Acute kidney injury (AKI) with acute tubular necrosis (ATN) ([place]) 11/11/2018  . Upper airway cough syndrome 03/31/2018  . DOE (dyspnea on exertion) 03/30/2018  . 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 Acute renal failure ([place]) 10/14/2017  . Pulmonary embolism and infarction ([place]) 10/14/2017  . Acute metabolic encephalopathy 34/91/7915  . Cardiac arrest ([place]) 10/14/2017  . Non-STEMI (non-ST elevated myocardial infarction) ([place])   . Pulmonary embolus, left ([place])   . Acute deep vein thrombosis (DVT) of distal end of left lower extremity ([place])   . Acute deep vein thrombosis (DVT) of radial vein of right upper extremity ([place])   . Left hemiparesis ([place])   . Severe protein-calorie malnutrition ([place])   . Acute encephalopathy   . Elevated troponin   . Acute on chronic respiratory failure with hypoxia ([place])   . Cardiac arrest ([place])   . AKI (acute kidney injury) ([place])   . Acute respiratory failure with hypoxemia ([place])   . Respiratory failure ([place])   . Abdominal pain 09/04/2017  . PUD (peptic ulcer disease) 09/04/2017  . Mesenteric ischemia ([place]) 09/04/2017  . 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 Left arm numbness 12/14/2015  . Numbness 12/14/2015  . Bilateral carotid artery stenosis 11/22/2015  . Recurrent major depressive disorder in partial remission ([place]) 10/19/2015  . Mitral valve prolapse 09/07/2015  . Vitamin D deficiency 08/31/2015  . Arterial atherosclerosis 04/10/2015  . Cataract 04/10/2015  . Cerebrovascular accident, old 04/10/2015  . Gonalgia 04/10/2015  . HLD (hyperlipidemia) 04/10/2015  . 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 Renal artery stenosis ([place]) 04/10/2015  .  History of non-ST elevation myocardial infarction (NSTEMI) 04/10/2015  . Anemia associated with acute blood loss 08/24/2014  . Thrombocytopenia ([place])   . Hypokalemia   . Essential hypertension 08/09/2014  . Chest pain 08/09/2014  . Near syncope 08/09/2014  . CKD (chronic kidney disease) stage 3, GFR 30-59 ml/min (HCC) 08/09/2014  . Tobacco abuse 08/09/2014  . Upper GI bleeding   . Gastric ulcer with hemorrhage   . Acute blood loss anemia   . Cerebral infarction ([place]) 05/16/2013  . HTN (hypertension) 05/16/2013  . 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   PCP:  Rosaria Ferries, MD Pharmacy:   [facility], [address] [place] [address] Phone: [phone] Fax: [phone]     Social Determinants of Health (SDOH) Interventions    Readmission Risk Interventions Readmission Risk Prevention Plan 11/16/2018  Transportation Screening Complete  PCP or Specialist Appt within 3-5 Days Not Complete  Not Complete comments not ready for d/c, from [place] or [place] Complete  Social Work Consult for [place] Planning/Counseling Complete  Palliative Care Screening Not Applicable  Medication Review Press photographer) Complete  Some recent data might be hidden

## 2020-09-04 DIAGNOSIS — N179 Acute kidney failure, unspecified: Secondary | ICD-10-CM | POA: Diagnosis not present

## 2020-09-04 DIAGNOSIS — E872 Acidosis: Secondary | ICD-10-CM | POA: Diagnosis not present

## 2020-09-04 DIAGNOSIS — R7401 Elevation of levels of liver transaminase levels: Secondary | ICD-10-CM | POA: Diagnosis not present

## 2020-09-04 LAB — CULTURE, BLOOD (ROUTINE X 2): Culture: NO GROWTH

## 2020-09-04 LAB — CBC
HCT: 27.8 % — ABNORMAL LOW (ref 39.0–52.0)
Hemoglobin: 9.2 g/dL — ABNORMAL LOW (ref 13.0–17.0)
MCH: 29.8 pg (ref 26.0–34.0)
MCHC: 33.1 g/dL (ref 30.0–36.0)
MCV: 90 fL (ref 80.0–100.0)
Platelets: 131 10*3/uL — ABNORMAL LOW (ref 150–400)
RBC: 3.09 MIL/uL — ABNORMAL LOW (ref 4.22–5.81)
RDW: 14.7 % (ref 11.5–15.5)
WBC: 15.5 10*3/uL — ABNORMAL HIGH (ref 4.0–10.5)
nRBC: 0 % (ref 0.0–0.2)

## 2020-09-04 LAB — COMPREHENSIVE METABOLIC PANEL
ALT: 57 U/L — ABNORMAL HIGH (ref 0–44)
AST: 114 U/L — ABNORMAL HIGH (ref 15–41)
Albumin: 1.7 g/dL — ABNORMAL LOW (ref 3.5–5.0)
Alkaline Phosphatase: 192 U/L — ABNORMAL HIGH (ref 38–126)
Anion gap: 12 (ref 5–15)
BUN: 94 mg/dL — ABNORMAL HIGH (ref 8–23)
CO2: 17 mmol/L — ABNORMAL LOW (ref 22–32)
Calcium: 7.9 mg/dL — ABNORMAL LOW (ref 8.9–10.3)
Chloride: 105 mmol/L (ref 98–111)
Creatinine, Ser: 5.45 mg/dL — ABNORMAL HIGH (ref 0.61–1.24)
GFR, Estimated: 11 mL/min — ABNORMAL LOW (ref >60)
Glucose, Bld: 97 mg/dL (ref 70–99)
Potassium: 4 mmol/L (ref 3.5–5.1)
Sodium: 134 mmol/L — ABNORMAL LOW (ref 135–145)
Total Bilirubin: 1.1 mg/dL (ref 0.3–1.2)
Total Protein: 5 g/dL — ABNORMAL LOW (ref 6.5–8.1)

## 2020-09-04 LAB — VANCOMYCIN, RANDOM: Vancomycin Rm: 17

## 2020-09-04 LAB — MAGNESIUM: Magnesium: 1.8 mg/dL (ref 1.7–2.4)

## 2020-09-04 LAB — SARS CORONAVIRUS 2 (TAT 6-24 HRS): SARS Coronavirus 2: NEGATIVE

## 2020-09-04 MED ORDER — AMOXICILLIN-POT CLAVULANATE 500-125 MG PO TABS: 1.0000 | ORAL_TABLET | Freq: Every day | ORAL | Status: DC

## 2020-09-04 MED ORDER — AMOXICILLIN-POT CLAVULANATE 500-125 MG PO TABS: 1.0000 | ORAL_TABLET | Freq: Every day | ORAL | 0 refills | Status: DC

## 2020-09-04 MED ORDER — IPRATROPIUM-ALBUTEROL 0.5-2.5 (3) MG/3ML IN SOLN: 3.0000 mL | Freq: Four times a day (QID) | RESPIRATORY_TRACT | Status: AC | PRN

## 2020-09-04 MED ORDER — METOPROLOL TARTRATE 25 MG PO TABS: 12.5000 mg | ORAL_TABLET | Freq: Two times a day (BID) | ORAL | Status: DC

## 2020-09-04 NOTE — Progress Notes (Signed)
FPTS Interim Progress Note  S: Patient is informed about his blood culture growing gram-positive rods, anaerobic bottle only.  He states that " I do not care about it and I would like to go to my group home now".  He was explained again and he repeated after the provider that he has bacteria growing in his blood, detected by his blood culture but patient states that he would like to go back and would not further stay in the hospital.  O: BP (!) 134/59 (BP Location: Right Arm)   Pulse 91   Temp 98.7 F (37.1 C) (Oral)   Resp 20   Ht 5\' 8"  (1.727 m)   Wt 216 lb 0.8 oz (98 kg)   SpO2 91%   BMI 32.85 kg/m       Honor Junes, MD 09/04/2020, 2:17 PM PGY-1, [facility] Service pager [phone]

## 2020-09-04 NOTE — Progress Notes (Signed)
[facility] Progress Note     Subjective: CC-  Feels the same as yesterday. Denies abdominal pain, nausea, vomiting. Ostomy productive. Only took a few bites of breakfast. WBC up 15.5, afebrile. LFTs also up from yesterday. Patient just wants to be discharged.  Objective: Vital signs in last 24 hours: Temp:  [98.1 F (36.7 C)-98.7 F (37.1 C)] 98.7 F (37.1 C) (05/16 0802) Pulse Rate:  [82-96] 92 (05/16 0802) Resp:  [14-20] 16 (05/16 0802) BP: (95-145)/(66-96) 145/96 (05/16 0802) SpO2:  [90 %-98 %] 91 % (05/16 0802) Weight:  [98 kg] 98 kg (05/16 0334) Last BM Date: 09/03/20  Intake/Output from previous day: 05/15 0701 - 05/16 0700 In: -  Out: 400 [Urine:200; Stool:200] Intake/Output this shift: Total I/O In: 60 [P.O.:60] Out: -   PE: Gen:  Alert, NAD, pleasant Pulm: rate and effort normal Abd: Soft, NT/ND, ostomy with soft/liquid brown stool in pouch  Lab Results:  Recent Labs    09/03/20 0243 09/04/20 0100  WBC 14.5* 15.5*  HGB 9.0* 9.2*  HCT 27.2* 27.8*  PLT 121* 131*   BMET Recent Labs    09/03/20 0243 09/04/20 0100  NA 134* 134*  K 4.2 4.0  CL 103 105  CO2 17* 17*  GLUCOSE 105* 97  BUN 94* 94*  CREATININE 6.01* 5.45*  CALCIUM 7.8* 7.9*   PT/INR No results for input(s): LABPROT, INR in the last 72 hours. CMP     Component Value Date/Time   NA 134 (L) 09/04/2020 0100   NA 138 08/17/2020 1532   K 4.0 09/04/2020 0100   CL 105 09/04/2020 0100   CO2 17 (L) 09/04/2020 0100   GLUCOSE 97 09/04/2020 0100   BUN 94 (H) 09/04/2020 0100   BUN 38 (H) 08/17/2020 1532   CREATININE 5.45 (H) 09/04/2020 0100   CALCIUM 7.9 (L) 09/04/2020 0100   PROT 5.0 (L) 09/04/2020 0100   PROT 6.1 08/17/2020 1532   ALBUMIN 1.7 (L) 09/04/2020 0100   ALBUMIN 3.5 (L) 08/17/2020 1532   AST 114 (H) 09/04/2020 0100   ALT 57 (H) 09/04/2020 0100   ALKPHOS 192 (H) 09/04/2020 0100   BILITOT 1.1 09/04/2020 0100   BILITOT 0.3 08/17/2020 1532   GFRNONAA 11 (L)  09/04/2020 0100   GFRAA 21 (L) 11/25/2018 0415   Lipase     Component Value Date/Time   LIPASE 33 08/31/2020 0645       Studies/Results: DG Chest 1 View  Result Date: 09/02/2020 CLINICAL DATA:  Shortness of breath. Follow-up bibasilar atelectasis and/or pneumonia. EXAM: PORTABLE CHEST 1 VIEW COMPARISON:  08/31/2020 and earlier, including CTA chest 10/01/2017. FINDINGS: Cardiac silhouette upper normal in size to slightly enlarged for AP portable technique, unchanged. Dense atelectasis in the RIGHT MIDDLE LOBE and RIGHT LOWER LOBE, new since the examination 2 days ago. Increasing patchy airspace opacities in the LEFT mid lung and LEFT lung base. BILATERAL pleural effusions are suspected. IMPRESSION: 1. New dense atelectasis involving the RIGHT MIDDLE LOBE and RIGHT LOWER LOBE since the examination 2 days ago. Query mucous plugging. 2. Worsening pneumonia involving the LEFT lung. 3. Probable BILATERAL pleural effusions. Electronically Signed   By: Evangeline Dakin M.D.   On: 09/02/2020 13:53    Anti-infectives: Anti-infectives (From admission, onward)   Start     Dose/Rate Route Frequency Ordered Stop   09/02/20 1200  vancomycin (VANCOREADY) IVPB 500 mg/100 mL        500 mg 100 mL/hr over 60 Minutes Intravenous  Once 09/02/20 0841  09/02/20 2135   09/01/20 0900  metroNIDAZOLE (FLAGYL) IVPB 500 mg        500 mg 100 mL/hr over 60 Minutes Intravenous Every 8 hours 09/01/20 0801     08/31/20 1415  vancomycin (VANCOREADY) IVPB 2000 mg/400 mL        2,000 mg 200 mL/hr over 120 Minutes Intravenous  Once 08/31/20 1319 08/31/20 2045   08/31/20 1415  ceFEPIme (MAXIPIME) 2 g in sodium chloride 0.9 % 100 mL IVPB        2 g 200 mL/hr over 30 Minutes Intravenous Every 24 hours 08/31/20 1319     08/31/20 1343  vancomycin variable dose per unstable renal function (pharmacist dosing)  Status:  Discontinued         Does not apply See admin instructions 08/31/20 1343 09/02/20 1302        Assessment/Plan CAD/MI in 2015 PVD CVA HTN HLD A.fib, 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 Cholecystitis - Present for goals of care discussion today with Dr. Thompson Grayer. Patient desires to leave the hospital. He is clinically improving and has no abdominal pain on exam, however, his WBC and LFTs are uptrending today. He understands that he could potentially get worse by stopping IV antibiotics and leaving the hospital, and he would still like to return to [facility] today. Primary team plans to respect his wishes and discharge today. Recommend oral antibiotics x2 weeks (augmentin).    LOS: 4 days    Wellington Hampshire, [facility] 09/04/2020, 9:56 AM Please see Amion for pager number during day hours 7:00am-4:30pm

## 2020-09-04 NOTE — Progress Notes (Signed)
PHARMACY - PHYSICIAN COMMUNICATION CRITICAL VALUE ALERT - BLOOD CULTURE IDENTIFICATION (BCID)  Abdelaziz Westenberger is an 70 y.o. male who presented to [facility] on 08/30/2020 with a chief complaint of AKI. Team concerned for aspiration pneumonia on admission, and patient was started on antibiotics. Now 5/11 bcx growing GPR in 1/4 bottles - likely contamination.  Assessment: Bcx + 1/4 GPR - no BCID - likely contamination  Name of physician (or Provider) Contacted: Dr. Berna Spare  Current antibiotics: vanc/cefepime > amox/clav on discharge  Changes to prescribed antibiotics recommended:  No changes - likely contamination  No results found for this or any previous visit.  Esmond Plants 09/04/2020  12:22 PM

## 2020-09-04 NOTE — Progress Notes (Signed)
Pharmacy Antibiotic Note  Luke Kaufman is a 70 y.o. male admitted on 08/30/2020 with acute renal failure with concern for cholecystitis.  Pharmacy has been consulted for vancomycin and cefepime dosing.   5/16 VR 17, last dose 5/14. Patient wants to go back to SNF, likely with hospice. Changing to amox/clav.   Plan:  Stop vanc/cefepime at discharge Start amox/clav 500mg -125 QD   Height: 5\' 8"  (172.7 cm) Weight: 98 kg (216 lb 0.8 oz) IBW/kg (Calculated) : 68.4  Temp (24hrs), Avg:98.5 F (36.9 C), Min:98.1 F (36.7 C), Max:98.7 F (37.1 C)  Recent Labs  Lab 08/30/20 1249 08/30/20 2207 08/31/20 1347 09/01/20 0518 09/01/20 0844 09/01/20 1827 09/02/20 0117 09/03/20 0243 09/04/20 0100  WBC  --    < > 4.1 8.4  --   --  10.7* 14.5* 15.5*  CREATININE  --    < >  --   --  7.14* 6.76* 6.54* 6.01* 5.45*  LATICACIDVEN 0.7  --  1.6  --   --   --   --   --   --   VANCORANDOM  --   --   --   --   --   --  20  --  17   < > = values in this interval not displayed.    Estimated Creatinine Clearance: 14.3 mL/min (A) (by C-G formula based on SCr of 5.45 mg/dL (H)).    No Known Allergies  Antimicrobials this admission: Cefepime 5/12 >>  Vanc 5/12 >>  Metronidazole 5/13>>   Microbiology results: 5/12 BCx: NGTD 5/12 UCx: negative 5/12 MRSA: positive   Thank you for allowing pharmacy to be a part of this patient's care.  Benetta Spar, PharmD, BCPS, BCCP Clinical Pharmacist  Please check AMION for all [place] phone numbers After 10:00 PM, call [place] [phone]

## 2020-09-04 NOTE — Progress Notes (Signed)
[facility] will sign off.    Telemetry with sinus rhythm and PACs.  Medication Recommendations:  Continue Lopressor 12.5mg  BID and Eliquis 2.5mg  BID Other recommendations (labs, testing, etc):  Can use amiodarone if recurrent afib RVR or up-titrate BB (see consult note) Follow up as an outpatient:  As scheduled

## 2020-09-04 NOTE — TOC Transition Note (Signed)
Transition of Care [facility]) - CM/SW Discharge Note   Patient Details  Name: Luke Kaufman MRN: [id number] Date of Birth: 06-20-1950  [facility]) CM/SW Contact:  Joanne Chars, LCSW Phone Number: 09/04/2020, 3:42 PM   Clinical Narrative:  Pt discharging to [facility] today.  RN please call report to [phone].   If covid test not back in time for CSW to call, please call PTAR at [phone].    Final next level of care: Skilled Nursing Facility Barriers to Discharge: Barriers Resolved   Patient Goals and CMS Choice Patient states their goals for this hospitalization and ongoing recovery are:: "stay healthy" CMS Medicare.gov Compare Post Acute Care list provided to::  (NA-pt wanting to return to [facility])    Discharge Placement              Patient chooses bed at: [facility] Patient to be transferred to facility by: [place] Name of family member notified: none available    Discharge Plan and Services In-house Referral: Clinical Social Work   Post Acute Care Choice: [place]:  (hospice) [facility] Agency: Hospice and [place] Date [place]: 09/03/20 Time [place]: 1030 Representative spoke with at [place]: [place] (SDOH) Interventions     Readmission Risk Interventions Readmission Risk Prevention Plan 11/16/2018  Transportation Screening Complete  PCP or Specialist Appt within 3-5 Days Not Complete  Not Complete comments not ready for d/c, from [place] [facility] SNF  [place] or [place] Complete  Social Work Consult for [place] Planning/Counseling Complete  Palliative Care Screening Not Applicable  Medication Review Press photographer) Complete  Some recent data might be hidden

## 2020-09-04 NOTE — Progress Notes (Signed)
Patient COVID  test negative. PTAR updated and states pt can be picked up in the next 2 hours. CN notified.

## 2020-09-04 NOTE — Discharge Summary (Addendum)
[facility] Discharge Summary  Patient name: Luke Kaufman Medical record number: [id number] Date of birth: April 21, 1951 Age: 70 y.o. Gender: male Date of Admission: 08/30/2020  Date of Discharge: 09/04/2020 Admitting Physician: Zenia Resides, MD  Primary Care Provider: Rosaria Ferries, MD Consultants: Nephrology, surgery  Indication for Hospitalization: Acute on chronic renal failure with metabolic acidosis.  Discharge Diagnoses/Problem List:  1.  Acute on chronic renal failure I metabolic acidosis-improving 2.  Acute cholecystitis 3.  Sepsis -Resolved 4.  Aspiration Pneumonia 5.  Hypokalemia, hypomagnesemia, hypocalcemia-resolved 6.  Atrial fibrillation  Disposition: SNF  Discharge Condition: Stable  Discharge Exam:Temp:  [98.1 F (36.7 C)-98.7 F (37.1 C)] 98.7 F (37.1 C) (05/16 0438) Pulse Rate:  [82-114] 93 (05/16 0438) Resp:  [14-20] 17 (05/16 0438) BP: (95-138)/(66-90) 138/69 (05/16 0438) SpO2:  [90 %-98 %] 90 % (05/16 0438) Weight:  [98 kg] 98 kg (05/16 0334)  Completed by Dr. Caron Presume General: Sitting up in bed, alert and conversant Cardiovascular: RRR, DP 2+ bilaterally Respiratory: Breathing on room air, SPO2 90%, no increased WOB.  Mildly coarse breath sounds throughout, no wheezes Abdomen: Midline bandage post changed to 5/16.  Ostomy bag with air and brown stool.  Normoactive bowel sounds.  Nontender to palpation Extremities: Dry, cracked skin on lower extremities bilaterally, mild nonpitting edema bilaterally  Brief Hospital Course:  Acute on Chronic Renal Failure with Metabolic Acidosis Patient presented with worsening SOB on 5/11 from [facility] and admitted to [facility] after found to have severe acidosis on ABG, pH 7.1 and HCO3 13 with Creatinine of 8.1. Baseline Crt is 2-3. During his admission he received IVF Sodium Bicarbonate from 5/11-5/13. Metabolic acidosis gradually improved with HCO3 of 17 and Creatinine  5.45 at discharge. Urinary Ouput was minimal during admission, foley catheter was placed for 2 days, and removed.Patient does not wishes to undergo HD.  Sepsis 2/2 Aspiration Pneumonia CXR on 5/11 showed new bibasilar infiltrates. MRSA nasal swab was positive. Started on Cefepime and Vancomycin for empiric treatment from 5/12-5/16. During hospitalization patient has used 2-5L [place], as well as HFNC. Patient was breathing on room air at discharge with SpO2 ranging from 90-93%. Patient uses 2-3L at baseline. No complaints of dyspnea at discharge. Patient was discharged with Augmentin 500 daily.   Acute Cholecystis 2/2 Gallstones RUQ U/S showed Gallstones with mild gallbladder distension. HIDA scan was consistent  with acute cholecystis. Empiric treatment with Metronidazole from 5/13-5/16. Gen Surgery consulted and determined that he is not a surgical candidate and recommended continued antibiotic treatment. Patient has remained asymptomatic during admission. WBC increased from 5.6- 15.5 during admission, with AST 114, and 57. IV antibiotics were stopped on 5/16, and patient was discharged on PO Augmentin 500 daily for 3 weeks.  HLD  Taking atovostatin 80mg  at home and continued at admission. Given that his CK was elevated 1150 at admission and found to be 1175 on 5/13, we held Atorvastatin and discontinued at discharge given that patient is returning to SNF with hospice. Patient denies any myalgias.   HTN, stable  BP was normotensive at admission and home medication of Metoprolol 12.5mg  BID was held and subsequenly restarted 5/13.   Patient had one episode of hypotension on 5/14 with BP of 84/52, in which 1 dose of Metoprolol was held, and then restarted the following day. BP was normotensive at discharge, and discharged with Metoprolol 12.5mg  BID.   Atrial Fibrillation, stable History of PAF with RVR. CHA2DS2-VASc score is 5. EKGs have been remarkable  for PACs and sinus tachycardia, but no atrial  fibrillation. Cardiology was consulted and recommended Metoprolol 12.5mg  BID for rate control. Home anticogulation of Eliquis was held while the patient received Heparin during admission. Eliquis was restarted 5/15. Patient will need follow up with Cardiology outpatient.  Hypokalemia Hypomagnesium Hypocalcemia During admission  he received 5 runs KCl since admission. K + was 3.4 this morning. Hypokalemia most likely iatrogenic due to intracellular shift while patient was receiving Sodium Bicarb IVF. Magnesium was also repleted with IV Magnesium and PO Magnesium Oxide tablets. Electrolytes were within normal limits at discharge.   Anemia of Chronic Disease, stable Hbg 9.9 on admission and 9.6 at discharge. He remained hemodynamically stable during admission.   Goals of Care During admission, patient spoke with Palliative Care to discuss and clarify wishes and goals of care. Given patient's acute renal failure and wishes not receive any dialysis treatment, he would like his code status to be DNR/DNI, and does not wish to return to the hospital in the event he decompensates. He will be discharge with hospice care.   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 Resides in a nursing home. Without new neurological deficit. There were no PT/OT outpatient recommendations.   CAD Hx V. Fib Cardiac arrest  Followed by Dr. Radford Pax. 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 Patient had post-surgical respiratory failure requiring tracheostomy and v-fib cardiac arrest.   Hx DVT and Pulmonary Embolism  Home medications include Eliquis 2.5mg  BID. Held during admission in the setting of acute renal failure. He received Pharmacy dosed Heparin during his admission Eliquis was renally dosed per Pharmacy and restarted on 5/15. He will be discharged with Eliquis 2.5mg  BID.   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 Patient denies any abdominal pain, non-tender to palpation on PE. He received beside ostomy care and dressing changes.   Depression, chronic, stable Home medications include Sertaline 50mg , and was continued during admission. Patient should continue medications and follow up outpatient.   Follow up 1. Cardiology 2. Nephrology      Issues for Follow Up:  1. CBC, BMP at PCP appointment 2. Cardiology f/u 3. Nephrology f/u  Significant Procedures: None  Significant Labs and Imaging:  Recent Labs  Lab 09/02/20 0117 09/03/20 0243 09/04/20 0100  WBC 10.7* 14.5* 15.5*  HGB 9.1* 9.0* 9.2*  HCT 27.2* 27.2* 27.8*  PLT 132* 121* 131*   Recent Labs  Lab 08/31/20 0645 09/01/20 0518 09/01/20 0844 09/01/20 1827 09/02/20 0117 09/03/20 0243 09/04/20 0100  NA 138  --  135 131* 133* 134* 134*  K 3.2*  --  2.9* 3.2* 3.4* 4.2 4.0  CL 107  --  100 99 100 103 105  CO2 17*  --  18* 17* 18* 17* 17*  GLUCOSE 90  --  102* 128* 107* 105* 97  BUN 75*  --  81* 84* 87* 94* 94*  CREATININE 7.45*  --  7.14* 6.76* 6.54* 6.01* 5.45*  CALCIUM 7.3*  --  6.9* 7.0* 7.1* 7.8* 7.9*  MG 1.4* 1.6*  --   --  1.7 1.9 1.8  PHOS 8.0* 6.3*  --   --   --   --   --   ALKPHOS 200*  --  131*  --  127* 172* 192*  AST 63*  --  56*  --  80* 92* 114*  ALT 46*  --  37  --  43 48* 57*  ALBUMIN 2.4*  --  1.9*  --  1.8* 1.7* 1.7*    DG Chest 1 View  Result Date: 09/02/2020 CLINICAL DATA:  Shortness of breath. Follow-up bibasilar atelectasis and/or pneumonia. EXAM: PORTABLE CHEST 1 VIEW COMPARISON:  08/31/2020 and earlier, including CTA chest 10/01/2017. FINDINGS: Cardiac silhouette upper normal in size to slightly enlarged for AP portable technique, unchanged. Dense atelectasis in the RIGHT MIDDLE LOBE and RIGHT LOWER LOBE, new since the examination 2 days ago. Increasing patchy airspace opacities in the LEFT mid lung and LEFT lung base. BILATERAL pleural effusions are  suspected. IMPRESSION: 1. New dense atelectasis involving the RIGHT MIDDLE LOBE and RIGHT LOWER LOBE since the examination 2 days ago. Query mucous plugging. 2. Worsening pneumonia involving the LEFT lung. 3. Probable BILATERAL pleural effusions. Electronically Signed   By: Evangeline Dakin M.D.   On: 09/02/2020 13:53   NM Hepato W/EF  Result Date: 09/01/2020 CLINICAL DATA:  In cholelithiasis with distended gallbladder and upper normal gallbladder wall thickness, question cholecystitis EXAM: NUCLEAR MEDICINE HEPATOBILIARY IMAGING TECHNIQUE: Sequential images of the abdomen were obtained out to 120 minutes following intravenous administration of radiopharmaceutical. RADIOPHARMACEUTICALS:  5 mCi Tc-5m  Choletec IV COMPARISON:  Ultrasound abdomen 08/31/2020 FINDINGS: Normal tracer extraction from bloodstream indicating normal hepatocellular function. Prompt excretion of tracer into biliary tree. Small bowel visualized at 41 minutes. At 1 hour gallbladder had not visualized. Additional 60 minutes of imaging performed. Gallbladder failed to visualize despite delayed imaging. Findings are consistent with cystic duct obstruction and acute cholecystitis. IMPRESSION: Nonvisualization of the gallbladder at 2 hours consistent with acute cholecystitis. Electronically Signed   By: Lavonia Dana M.D.   On: 09/01/2020 16:54   US Abdomen Limited RUQ (LIVER/GB)  Result Date: 08/31/2020 CLINICAL DATA:  Nausea and vomiting. EXAM: ULTRASOUND ABDOMEN LIMITED RIGHT UPPER QUADRANT COMPARISON:  Abdominal CT 10/26/2017 FINDINGS: Gallbladder: Distended containing intraluminal sludge and stones. Upper normal gallbladder thickness of 3 mm. No pericholecystic fluid. No sonographic Murphy sign noted by sonographer. Common bile duct: Diameter: 6 mm, normal Liver: Limited assessment due to difficulties with positioning. No obvious liver lesion is seen. Normal parenchymal echogenicity. Portal vein is patent on color Doppler imaging with  normal direction of blood flow towards the liver. Other: No right upper quadrant ascites. IMPRESSION: 1. Gallstones with mild gallbladder distension. Upper normal gallbladder thickness of 3 mm, but negative sonographic Murphy sign. If there is clinical concern for acute cholecystitis, consider nuclear medicine HIDA scan. 2. No biliary dilatation. 3. Limited sonographic assessment of the liver, grossly normal where visualized. Electronically Signed   By: Raquel Rake M.D.   On: 08/31/2020 19:55    Results/Tests Pending at Time of Discharge: Blood culture -- Patient informed that out of 5 culture tubes 1 is growing gram-positive rods, anaerobic but patient still adamant to return to his SNF.  Discharge Medications:  Allergies as of 09/04/2020   No Known Allergies     Medication List    STOP taking these medications   atorvastatin 80 MG tablet Commonly known as: LIPITOR   gabapentin 100 MG capsule Commonly known as: NEURONTIN   omeprazole 20 MG capsule Commonly known as: PRILOSEC   sodium chloride 0.9 % Soln     TAKE these medications   acetaminophen 325 MG tablet Commonly known as: TYLENOL Place 2 tablets (650 mg total) into feeding tube every 6 (six) hours. What changed:   how to take this  when to take this  reasons to take this   amoxicillin-clavulanate 500-125 MG tablet Commonly known as: Augmentin Take 1 tablet (500 mg total) by mouth daily for 21 days.   apixaban 2.5 MG Tabs tablet Commonly known as: ELIQUIS Take 2.5 mg by mouth 2 (two) times daily.   ascorbic acid 500 MG tablet Commonly known as: VITAMIN C Take 500 mg by mouth daily.   cholestyramine 4 g packet Commonly known as: QUESTRAN Take 4 g by mouth daily.   ergocalciferol 1.25 MG (50000 UT) capsule Commonly known as: VITAMIN D2 Take 50,000 Units by mouth once a week. On thursdays   fluticasone 50 MCG/ACT nasal spray Commonly known as: FLONASE Place 1 spray into both nostrils daily.    ipratropium-albuterol 0.5-2.5 (3) MG/3ML Soln Commonly known as: DUONEB Take 3 mLs by nebulization every 6 (six) hours as needed.   metoprolol tartrate 25 MG tablet Commonly known as: LOPRESSOR Take 0.5 tablets (12.5 mg total) by mouth 2 (two) times daily.   ondansetron 4 MG tablet Commonly known as: ZOFRAN Take 4 mg by mouth every 8 (eight) hours as needed for nausea or vomiting.   OXYGEN Place 2 L/min into the nose at bedtime. Nasal cannula   PROBIOTIC PO Take 1 tablet by mouth daily.   sertraline 50 MG tablet Commonly known as: ZOLOFT Take 50 mg by mouth daily.   sodium bicarbonate 650 MG tablet Take 1,300 mg by mouth 3 (three) times daily.   tamsulosin 0.4 MG Caps capsule Commonly known as: FLOMAX Take 0.4 mg by mouth daily.   Vitamin D3 Ultra Potency 1.25 MG (50000 UT) Tabs Generic drug: Cholecalciferol Take 1.25 mg by mouth once a week. On Thursday's       Discharge Instructions: Please refer to Patient Instructions section of EMR for full details.  Patient was counseled important signs and symptoms that should prompt return to medical care, changes in medications, dietary instructions, activity restrictions, and follow up appointments.   Follow-Up Appointments: At [facility]   Delailah Spieth, Meredith Staggers, MD 09/04/2020, 2:10 PM PGY-1, [place]

## 2020-09-04 NOTE — Progress Notes (Incomplete)
Family Medicine Teaching Service Daily Progress Note Intern Pager: [phone]  Patient name: Luke Kaufman Medical record number: [id number] Date of birth: [birth year] Age: 71 y.o. Gender: male  Primary Care Provider: Rosaria Ferries, MD Consultants: Nephrology, Surgery Code Status: DNR/DNI  Pt Overview and Major Events to Date:  5/11- patient admitted  5/12- Cefepime and Vancomycin started 5/13- Flagyl started, HIDA scan consistent with acute cholecystitis   Assessment and Plan: [name] is 70 yo male that presented with increasing weakness and SOB and admitted for Acute Renal Failure/Metabolic Acidosis in the setting of CKD Stage IV. 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   Acute on Chronic Renal Failure Metabolic Acidosis, improving Basline creatine is 2-3. Serum creatine 5.45 from 8.1 on admission, continually down trending.  Bicarb 17-18 in the past few days. UOP ~227m L yesterday. Per nephro, declined chronic HD. We restarted home sodium bicarb yesterday. Flomax and renally dosed Eliquis were started on 5/14. --Nephrology signed off, appreciated recommendations  -continue home sodium bicarb 1300mg  --Strict I/Os --Flomax 0.4mg  daily  Gallstones  Acute Cholecystis  HIDA scan c/w acute cholecystitis. General surgeon determined he was not a surgical candidate and recommended continue antibiotics.  Patient remains asymptomatic.  --Per Gen Surg: continue IV Metronidazole (5/13- ), Cefepime (5/12 -)  --Monitor abdominal exam for signs of acute abdomen  --AM CBC, CMP  Sepsis 2/2 Aspiration Pneumonia WBC trending up after discontining Vanc from 10.7>14.5>15.5 . We restarted Vancomycin on 5/15. Patient is breathing well and sating >90% on room air.  Declined BiPAP.  --continue IV Cefepime  --consider switch to PO abx in 24-48hrs depending on  clinical status/labs    Hypokalemia Hypomagnesium Hypocalcemia, resolved Potassium, corrected calcium and magnesium are WNL this morning.   --Replete as needed    Atrial Fibrillation, stable  HR ranges 80-90s over the last 24hrs.  Metoprolol was restarted yesterday. --Cardiology consulted, appreciate recommendations  --Metoprolol 12.5mg  BID  --continuous Tele-monitoring   Anemia of Chronic Disease, stable Hgb 9.2 and remained stable on the last 72hrs.   --Transfusion threshold is 8. --AM CBC   Goals of Care Patient spoke with Palliative Care yesterday about wishes and continued goals of care. Given patient's acute renal failure and wishes not receive any dialysis treatment,he changed code status to DNR/DNI and if he decompensates, he would like to return to [facility].We will keep him updated and try to honor his wishes.    Hypertension BP 138/69 this morning. Cardiology started Metoprolol, but was held on 5/14 due to asymptomatic hypotension and restarted yesterday. He has remained normotensive. --monitor vitals   Hx of DVT and Pulmonary Embolism Restarted home Eliquis 2.5mg  on 5/15.  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 Patient denies any abdominal pain, non-tender to palpation on PE. --bedside ostomy care   Hx. V. Fib Cardiac Arrest --cardiac monitoring  Hyperlipidemia --hold home Atorvastatin 80mg  in setting of elevated CK --continue cholestyramine 4g  Depression, chronic, stable Home medications include Sertaline --continue Sertaline 50mg   FEN/GI: Heart Healthy PPx: Protonix 40mg     Status is: Inpatient  {Inpatient:23812}  Dispo: The patient is from: {From:23814}              Anticipated d/c is to: {To:23815}  Patient currently {Medically stable:23817}   Difficult to place patient  {Yes/No:25151}        Subjective:  [name] reports that he is feeling well this morning with no complaints. Denies any SOB, chest pain, or abdominal pain. Inquires about discharge soon.   Objective: Temp:  [98.1 F (36.7 C)-98.7 F (37.1 C)] 98.7 F (37.1 C) (05/16 0438) Pulse Rate:  [82-114] 93 (05/16 0438) Resp:  [14-20] 17 (05/16 0438) BP: (95-138)/(66-90) 138/69 (05/16 0438) SpO2:  [90 %-98 %] 90 % (05/16 0438) Weight:  [98 kg] 98 kg (05/16 0334) Physical Exam: General: Sitting up in bed, alert and conversant.  Cardiovascular: RRR, DP2+ b/l Respiratory: Breathing on room air. SpO2 90%, no increased WOB. Mildly course breath sounds throughout, no wheezes.  Abdomen: Mid-line Bandage gauze changed 5/16. Ostomy bag with air and brown stool. Normoactive bowel sounds. Non-tender to palpation.  Extremities: Dry, cracked skin on lower extremities bilaterally, mild non-pitting edema b/l.   Laboratory: Recent Labs  Lab 09/02/20 0117 09/03/20 0243 09/04/20 0100  WBC 10.7* 14.5* 15.5*  HGB 9.1* 9.0* 9.2*  HCT 27.2* 27.2* 27.8*  PLT 132* 121* 131*   Recent Labs  Lab 09/02/20 0117 09/03/20 0243 09/04/20 0100  NA 133* 134* 134*  K 3.4* 4.2 4.0  CL 100 103 105  CO2 18* 17* 17*  BUN 87* 94* 94*  CREATININE 6.54* 6.01* 5.45*  CALCIUM 7.1* 7.8* 7.9*  PROT 5.1* 5.2* 5.0*  BILITOT 0.9 0.9 1.1  ALKPHOS 127* 172* 192*  ALT 43 48* 57*  AST 80* 92* 114*  GLUCOSE 107* 105* 97     Imaging/Diagnostic Tests: Jacklynn Bue, Medical Student 09/04/2020, 7:55 AM PGY-***, [place] Intern pager: [phone]*, text pages welcome

## 2020-09-05 ENCOUNTER — Encounter (HOSPITAL_COMMUNITY): Payer: Self-pay

## 2020-09-05 ENCOUNTER — Other Ambulatory Visit: Payer: Self-pay

## 2020-09-05 ENCOUNTER — Emergency Department (HOSPITAL_COMMUNITY): Payer: Medicare Other

## 2020-09-05 ENCOUNTER — Inpatient Hospital Stay (HOSPITAL_COMMUNITY)
Admission: EM | Admit: 2020-09-05 | Discharge: 2020-09-11 | DRG: 871 | Disposition: A | Discharge status: Skilled Nursing Facility | Payer: Medicare Other | Attending: Family Medicine | Admitting: Family Medicine

## 2020-09-05 ENCOUNTER — Other Ambulatory Visit (HOSPITAL_COMMUNITY): Payer: Self-pay

## 2020-09-05 DIAGNOSIS — Z66 Do not resuscitate: Secondary | ICD-10-CM

## 2020-09-05 DIAGNOSIS — I120 Hypertensive chronic kidney disease with stage 5 chronic kidney disease or end stage renal disease: Secondary | ICD-10-CM | POA: Diagnosis present

## 2020-09-05 DIAGNOSIS — N170 Acute kidney failure with tubular necrosis: Secondary | ICD-10-CM | POA: Diagnosis not present

## 2020-09-05 DIAGNOSIS — Z9981 Dependence on supplemental oxygen: Secondary | ICD-10-CM

## 2020-09-05 DIAGNOSIS — J690 Pneumonitis due to inhalation of food and vomit: Secondary | ICD-10-CM

## 2020-09-05 DIAGNOSIS — I480 Paroxysmal atrial fibrillation: Secondary | ICD-10-CM | POA: Diagnosis present

## 2020-09-05 DIAGNOSIS — Z86718 Personal history of other venous thrombosis and embolism: Secondary | ICD-10-CM

## 2020-09-05 DIAGNOSIS — N189 Chronic kidney disease, unspecified: Secondary | ICD-10-CM

## 2020-09-05 DIAGNOSIS — I69334 Monoplegia of upper limb following cerebral infarction affecting left non-dominant side: Secondary | ICD-10-CM

## 2020-09-05 DIAGNOSIS — J9601 Acute respiratory failure with hypoxia: Secondary | ICD-10-CM

## 2020-09-05 DIAGNOSIS — J810 Acute pulmonary edema: Secondary | ICD-10-CM | POA: Diagnosis not present

## 2020-09-05 DIAGNOSIS — G4733 Obstructive sleep apnea (adult) (pediatric): Secondary | ICD-10-CM | POA: Diagnosis present

## 2020-09-05 DIAGNOSIS — J811 Chronic pulmonary edema: Secondary | ICD-10-CM | POA: Insufficient documentation

## 2020-09-05 DIAGNOSIS — R0600 Dyspnea, unspecified: Secondary | ICD-10-CM | POA: Diagnosis not present

## 2020-09-05 DIAGNOSIS — I252 Old myocardial infarction: Secondary | ICD-10-CM

## 2020-09-05 DIAGNOSIS — J9621 Acute and chronic respiratory failure with hypoxia: Secondary | ICD-10-CM | POA: Diagnosis present

## 2020-09-05 DIAGNOSIS — D72828 Other elevated white blood cell count: Secondary | ICD-10-CM

## 2020-09-05 DIAGNOSIS — I2510 Atherosclerotic heart disease of native coronary artery without angina pectoris: Secondary | ICD-10-CM | POA: Diagnosis present

## 2020-09-05 DIAGNOSIS — I341 Nonrheumatic mitral (valve) prolapse: Secondary | ICD-10-CM

## 2020-09-05 DIAGNOSIS — A419 Sepsis, unspecified organism: Principal | ICD-10-CM | POA: Diagnosis present

## 2020-09-05 DIAGNOSIS — K8000 Calculus of gallbladder with acute cholecystitis without obstruction: Secondary | ICD-10-CM | POA: Diagnosis present

## 2020-09-05 DIAGNOSIS — K810 Acute cholecystitis: Secondary | ICD-10-CM

## 2020-09-05 DIAGNOSIS — Z8674 Personal history of sudden cardiac arrest: Secondary | ICD-10-CM

## 2020-09-05 DIAGNOSIS — Z86711 Personal history of pulmonary embolism: Secondary | ICD-10-CM

## 2020-09-05 DIAGNOSIS — G8194 Hemiplegia, unspecified affecting left nondominant side: Secondary | ICD-10-CM

## 2020-09-05 DIAGNOSIS — R600 Localized edema: Secondary | ICD-10-CM | POA: Diagnosis present

## 2020-09-05 DIAGNOSIS — F17210 Nicotine dependence, cigarettes, uncomplicated: Secondary | ICD-10-CM | POA: Diagnosis present

## 2020-09-05 DIAGNOSIS — Z20822 Contact with and (suspected) exposure to covid-19: Secondary | ICD-10-CM | POA: Diagnosis present

## 2020-09-05 DIAGNOSIS — R0609 Other forms of dyspnea: Secondary | ICD-10-CM | POA: Diagnosis present

## 2020-09-05 DIAGNOSIS — I959 Hypotension, unspecified: Secondary | ICD-10-CM | POA: Diagnosis present

## 2020-09-05 DIAGNOSIS — N179 Acute kidney failure, unspecified: Secondary | ICD-10-CM

## 2020-09-05 DIAGNOSIS — D72829 Elevated white blood cell count, unspecified: Secondary | ICD-10-CM | POA: Diagnosis present

## 2020-09-05 DIAGNOSIS — Z800 Family history of malignant neoplasm of digestive organs: Secondary | ICD-10-CM

## 2020-09-05 DIAGNOSIS — N400 Enlarged prostate without lower urinary tract symptoms: Secondary | ICD-10-CM | POA: Diagnosis present

## 2020-09-05 DIAGNOSIS — T17500A Unspecified foreign body in bronchus causing asphyxiation, initial encounter: Secondary | ICD-10-CM

## 2020-09-05 DIAGNOSIS — E785 Hyperlipidemia, unspecified: Secondary | ICD-10-CM | POA: Diagnosis present

## 2020-09-05 DIAGNOSIS — Z7189 Other specified counseling: Secondary | ICD-10-CM

## 2020-09-05 DIAGNOSIS — F32A Depression, unspecified: Secondary | ICD-10-CM | POA: Diagnosis present

## 2020-09-05 DIAGNOSIS — D631 Anemia in chronic kidney disease: Secondary | ICD-10-CM | POA: Diagnosis present

## 2020-09-05 DIAGNOSIS — N185 Chronic kidney disease, stage 5: Secondary | ICD-10-CM | POA: Diagnosis present

## 2020-09-05 DIAGNOSIS — R0603 Acute respiratory distress: Secondary | ICD-10-CM | POA: Diagnosis not present

## 2020-09-05 DIAGNOSIS — E8770 Fluid overload, unspecified: Secondary | ICD-10-CM | POA: Diagnosis present

## 2020-09-05 DIAGNOSIS — Z79899 Other long term (current) drug therapy: Secondary | ICD-10-CM

## 2020-09-05 DIAGNOSIS — I739 Peripheral vascular disease, unspecified: Secondary | ICD-10-CM | POA: Diagnosis present

## 2020-09-05 DIAGNOSIS — Z8249 Family history of ischemic heart disease and other diseases of the circulatory system: Secondary | ICD-10-CM

## 2020-09-05 DIAGNOSIS — Z7901 Long term (current) use of anticoagulants: Secondary | ICD-10-CM

## 2020-09-05 DIAGNOSIS — Z515 Encounter for palliative care: Secondary | ICD-10-CM

## 2020-09-05 HISTORY — DX: Encephalopathy, unspecified: G93.40

## 2020-09-05 HISTORY — DX: Acute embolism and thrombosis of deep veins of right upper extremity: I82.621

## 2020-09-05 HISTORY — DX: Acute embolism and thrombosis of unspecified deep veins of left distal lower extremity: I82.4Z2

## 2020-09-05 LAB — CBC WITH DIFFERENTIAL/PLATELET
Abs Immature Granulocytes: 0.37 10*3/uL — ABNORMAL HIGH (ref 0.00–0.07)
Basophils Absolute: 0 10*3/uL (ref 0.0–0.1)
Basophils Relative: 0 %
Eosinophils Absolute: 0 10*3/uL (ref 0.0–0.5)
Eosinophils Relative: 0 %
HCT: 29.9 % — ABNORMAL LOW (ref 39.0–52.0)
Hemoglobin: 9.7 g/dL — ABNORMAL LOW (ref 13.0–17.0)
Immature Granulocytes: 2 %
Lymphocytes Relative: 2 %
Lymphs Abs: 0.3 10*3/uL — ABNORMAL LOW (ref 0.7–4.0)
MCH: 29.7 pg (ref 26.0–34.0)
MCHC: 32.4 g/dL (ref 30.0–36.0)
MCV: 91.4 fL (ref 80.0–100.0)
Monocytes Absolute: 0.9 10*3/uL (ref 0.1–1.0)
Monocytes Relative: 5 %
Neutro Abs: 16.3 10*3/uL — ABNORMAL HIGH (ref 1.7–7.7)
Neutrophils Relative %: 91 %
Platelets: 155 10*3/uL (ref 150–400)
RBC: 3.27 MIL/uL — ABNORMAL LOW (ref 4.22–5.81)
RDW: 14.9 % (ref 11.5–15.5)
WBC: 17.9 10*3/uL — ABNORMAL HIGH (ref 4.0–10.5)
nRBC: 0 % (ref 0.0–0.2)

## 2020-09-05 LAB — COMPREHENSIVE METABOLIC PANEL
ALT: 70 U/L — ABNORMAL HIGH (ref 0–44)
AST: 154 U/L — ABNORMAL HIGH (ref 15–41)
Albumin: 1.6 g/dL — ABNORMAL LOW (ref 3.5–5.0)
Alkaline Phosphatase: 207 U/L — ABNORMAL HIGH (ref 38–126)
Anion gap: 10 (ref 5–15)
BUN: 100 mg/dL — ABNORMAL HIGH (ref 8–23)
CO2: 20 mmol/L — ABNORMAL LOW (ref 22–32)
Calcium: 8 mg/dL — ABNORMAL LOW (ref 8.9–10.3)
Chloride: 105 mmol/L (ref 98–111)
Creatinine, Ser: 5.02 mg/dL — ABNORMAL HIGH (ref 0.61–1.24)
GFR, Estimated: 12 mL/min — ABNORMAL LOW (ref >60)
Glucose, Bld: 105 mg/dL — ABNORMAL HIGH (ref 70–99)
Potassium: 4.3 mmol/L (ref 3.5–5.1)
Sodium: 135 mmol/L (ref 135–145)
Total Bilirubin: 1.6 mg/dL — ABNORMAL HIGH (ref 0.3–1.2)
Total Protein: 5.1 g/dL — ABNORMAL LOW (ref 6.5–8.1)

## 2020-09-05 LAB — PHOSPHORUS: Phosphorus: 4.7 mg/dL — ABNORMAL HIGH (ref 2.5–4.6)

## 2020-09-05 LAB — MAGNESIUM: Magnesium: 1.7 mg/dL (ref 1.7–2.4)

## 2020-09-05 MED ORDER — METRONIDAZOLE 500 MG/100ML IV SOLN
500.0000 mg | Freq: Three times a day (TID) | INTRAVENOUS | Status: DC
  Administered 2020-09-05: 500 mg via INTRAVENOUS
  Filled 2020-09-05: qty 100

## 2020-09-05 MED ORDER — APIXABAN 2.5 MG PO TABS
2.5000 mg | ORAL_TABLET | Freq: Two times a day (BID) | ORAL | Status: DC
  Administered 2020-09-05 – 2020-09-11 (×12): 2.5 mg via ORAL
  Filled 2020-09-05 (×15): qty 1

## 2020-09-05 MED ORDER — ACETAMINOPHEN 325 MG PO TABS
650.0000 mg | ORAL_TABLET | Freq: Four times a day (QID) | ORAL | Status: DC | PRN
  Administered 2020-09-10 – 2020-09-11 (×2): 650 mg via ORAL
  Filled 2020-09-05 (×3): qty 2

## 2020-09-05 MED ORDER — AMOXICILLIN-POT CLAVULANATE 500-125 MG PO TABS
1.0000 | ORAL_TABLET | Freq: Every day | ORAL | Status: DC
  Filled 2020-09-05: qty 1

## 2020-09-05 MED ORDER — ACETAMINOPHEN 650 MG RE SUPP: 650.0000 mg | Freq: Four times a day (QID) | RECTAL | Status: DC | PRN

## 2020-09-05 MED ORDER — WHITE PETROLATUM EX OINT
TOPICAL_OINTMENT | CUTANEOUS | Status: AC
  Filled 2020-09-05: qty 28.35

## 2020-09-05 MED ORDER — FUROSEMIDE 10 MG/ML IJ SOLN
20.0000 mg | Freq: Once | INTRAMUSCULAR | Status: AC
  Administered 2020-09-05: 20 mg via INTRAVENOUS
  Filled 2020-09-05: qty 2

## 2020-09-05 MED ORDER — VANCOMYCIN HCL 500 MG/100ML IV SOLN
500.0000 mg | Freq: Once | INTRAVENOUS | Status: AC
  Administered 2020-09-05: 500 mg via INTRAVENOUS
  Filled 2020-09-05: qty 100

## 2020-09-05 MED ORDER — SERTRALINE HCL 50 MG PO TABS
50.0000 mg | ORAL_TABLET | Freq: Every day | ORAL | Status: DC
  Administered 2020-09-05 – 2020-09-11 (×7): 50 mg via ORAL
  Filled 2020-09-05 (×7): qty 1

## 2020-09-05 MED ORDER — SPIRITUS FRUMENTI
1.0000 | Freq: Two times a day (BID) | ORAL | Status: DC | PRN
  Filled 2020-09-05: qty 1

## 2020-09-05 MED ORDER — POLYETHYLENE GLYCOL 3350 17 G PO PACK: 17.0000 g | PACK | Freq: Every day | ORAL | Status: DC | PRN

## 2020-09-05 MED ORDER — TAMSULOSIN HCL 0.4 MG PO CAPS: 0.4000 mg | ORAL_CAPSULE | Freq: Every day | ORAL | Status: DC

## 2020-09-05 MED ORDER — IPRATROPIUM-ALBUTEROL 0.5-2.5 (3) MG/3ML IN SOLN
3.0000 mL | Freq: Four times a day (QID) | RESPIRATORY_TRACT | Status: DC | PRN
  Administered 2020-09-10: 3 mL via RESPIRATORY_TRACT
  Filled 2020-09-05 (×2): qty 3

## 2020-09-05 MED ORDER — VANCOMYCIN HCL 1750 MG/350ML IV SOLN
1750.0000 mg | Freq: Once | INTRAVENOUS | Status: DC
  Filled 2020-09-05: qty 350

## 2020-09-05 MED ORDER — NICOTINE 7 MG/24HR TD PT24
7.0000 mg | MEDICATED_PATCH | Freq: Every day | TRANSDERMAL | Status: DC | PRN
  Filled 2020-09-05: qty 1

## 2020-09-05 MED ORDER — SODIUM BICARBONATE 650 MG PO TABS
1300.0000 mg | ORAL_TABLET | Freq: Three times a day (TID) | ORAL | Status: DC
  Administered 2020-09-05 – 2020-09-11 (×19): 1300 mg via ORAL
  Filled 2020-09-05 (×22): qty 2

## 2020-09-05 MED ORDER — SODIUM CHLORIDE 0.9 % IV SOLN
2.0000 g | Freq: Once | INTRAVENOUS | Status: AC
  Administered 2020-09-05: 2 g via INTRAVENOUS
  Filled 2020-09-05: qty 2

## 2020-09-05 NOTE — ED Notes (Signed)
Provider DO Espinoza would like to wean pt off of bipap. Will alert dayshift RN and RT

## 2020-09-05 NOTE — Progress Notes (Signed)
Spoke with [facility] which this patient's SNF.  A Education officer, museum at this facility reached out to the patient's roommate Izell [place] and got his cell phone number.  [place] phone number is [phone].  Will let the patient know and help him contact his roommate.

## 2020-09-05 NOTE — ED Notes (Addendum)
Spoke with RT will switch pt to nasal canula from BiPAP

## 2020-09-05 NOTE — ED Notes (Signed)
Providers are at the bedside. Approved pt to have diet and beverage.

## 2020-09-05 NOTE — ED Triage Notes (Signed)
Pt arrives with EMS after PTAR called from [facility] after acute onset rep distress. Pt was placed on CPAP and noted to have O2 sat 87%. Pt was just discharged from hospital. Pt has DNR and MOST form.

## 2020-09-05 NOTE — H&P (Addendum)
[facility] Admission History and Physical Service Pager: [phone]  Patient name: Luke Kaufman Medical record number: [id number] Date of birth: 11-Dec-1950 Age: 70 y.o. Gender: male  Primary Care Provider: Rosaria Ferries, MD Consultants: None Code Status: DNR Preferred Emergency Contact: [facility] ([facility] who is a minister and Luke Kaufman her father) Will need to contact facility for contact information as patient did not know.  Chief Complaint: Shortness of Breath  Assessment and Plan: Luke Kaufman is a 70 y.o. male presenting with shortness of breath. 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 Fib arrest, PVD, CVA, hypertension, hyperlipidemia, tobacco use, chronic hypoxic respiratory failure on oxygen at night, PUD with GI bleed, history of DVT and PE.  Pulmonary Edema likely 2/2 Aspiration PNA Hypoxic to 87% and in mild respiratory distress on arrival to ED, placed on Bi-PAP with improvement in oxygen saturations. CXR concerning for possible re-expansion of pulmonary edema. Labs work deferred in ED, however patient is amenable to this. Patient was discharged a few hours prior to arrival. He was hospitalized for acute renal failure and sepsis likely from aspiration pneumonia. He required supplemental oxygen and declined BiPAP at that time. MRSA nasal swab positive at that time and he was treated with cefepime and vanc from 5/12-5/16.  Suspect that this is an exacerbation of prior symptoms.  We will continue back on IV antibiotics and continue respiratory support. -Admit to FPTS observation, attending Dr. McDiarmid -Vitals per floor protocol -CBC with diff, BMP, Mg, Phos -Continue BiPAP -N.p.o. while on BiPAP -Duoneb 53mL q6h PRN wheezing, shortness of breath -IV abx: Vancomycin, cefepime per pharmacy -Cardiac telemetry  Goals of Care Palliative Care consulted during most recent admission  and clarified goals of care. Goals of care also discussed further with inpatient team. Patient decided on comfort measures only and declined dialysis, pressors, surgery or escalation of care if he decompensates. He was discharged back to his long-term care facility with hospice but returned via [facility] prior to arriving at his facility.  Patient tells me that he is agreeable to be admitted for continued respiratory support.  He is also agreeable to IV antibiotics and blood work.  He is aware of his chronic conditions and likelihood of poor prognosis. -Offer chaplain visit -Requests to speak with [facility] from Hospice  CKD Stage V, not on HD Recently admitted for acute renal failure and metabolic acidosis.  Creatinine on discharge 5.45 (baseline appears to be 2-3).  Patient was started on sodium bicarb last admission.  He does not desire starting hemodialysis. -Continue sodium bicarb 1300 mg TID -BMP, Mg, Phos daily -Strict I/O -Daily weights  Hypotension BP ranging 78-145/44/96. -Monitor BP -Hold Metoprolol  ? Bacteremia, likely contaminant  1/4 of the blood cultures drawn on 5/11 grew gram positive rods. This is likely a contaminant. The other set of blood cultures showed no growth in 5 days.  Paroxysmal Atrial Fibrillation CHA2DS2-VASc score of 5. Cardiology consulted on prior admission and patient was started on Metoprolol 12.5 mg BID for rate control. Discharged on Eliquis. Cardiology recommended amiodarone if recurrent afib with RVR or to up-titrate beta blocker. During my encounter HR irregularly irregular in 90's and low 100's. He is asymptomatic. Given his hypotension, will hold metoprolol at this time.  -Continue Eliquis -Hold Metoprolol   Gallstones  Acute Cholecystitis  Diagnosed by HIDA on recent admission. Surgery consulted at that time and plan was to finish 14-day course of abx. Patient  is not a surgical candidate nor does he desire surgery. Started on IV Metronidazole 5/13 but  ultimately discharged with instructions to take Augmentin x2 weeks.  -Start IV Metronidazole   HLD: Chronic, stable  Atorvastatin was recently discontinued at discharge due to elevated CK.  -Hold Atorvastatin  CVA: Chronic, stable  With residual chronic left arm flaccid paralysis. Resides in a nursing home. Without new neurological deficit.   Hx DVT and Pulmonary Embolism  In 2019, discovered s/p cardiac arrest.  On Eliquis 2.5 mg BID. -Eliquis 2.5 BID  SMA occlusion s/pright hemicolectomy and ileostomy in 08/2017 Had repeated surgeries with failed attempts at closure of abdomen.  -Wound care; ostomy care and dressing changes  Hx V. Fib Cardiac arrest  In 2020 after emergent surgery for SMA obstruction and mesenteric ischemia. Patient had post-surgical respiratory failure requiring tracheostomy and v-fib cardiac arrest. Following this had afib and was placed on Eliquis.   Depression: Chronic, stable -Continue Sertraline 50 mg daily   ? BPH On Tamsulosin.  -Holding Tamsulosin given soft BP's  Tobacco Use Smokes 5-6 cigarettes/day.  -Offer Nicotine patch as needed   FEN/GI: NPO while on BiPAP Prophylaxis: Eliquis 2.5 mg BID  Disposition: Med-telemetry   History of Present Illness:  Luke Kaufman is a 70 y.o. male presenting with shortness of breath.  History limited given patient is on BiPAP and difficulty talking.  He denies chest pain.  Feels that his breathing is improved on the BiPAP. His wish is to be comfortable though he is amenable to being admitted and starting back on IV antibiotics.  He is amenable to blood work as well.  He confirms that he desires to be DNR/DNI still.  He requests that he meet with [facility] from hospice.  Would also like CC and Luke Kaufman from his facility to be notified.  He does state "I think I am expiring, and that is okay".   Review Of Systems: Per HPI with the following additions:   Review of Systems  Respiratory: Positive for shortness of  breath.   Cardiovascular: Negative for chest pain.     Patient Active Problem List   Diagnosis Date Noted  . Dyspnea   . Acute renal failure (ARF) ([place]) 08/31/2020  . Transaminitis   . Non-intractable vomiting   . Metabolic acidosis 24/40/1027  . Acute kidney injury (AKI) with acute tubular necrosis (ATN) ([place]) 11/11/2018  . Upper airway cough syndrome 03/31/2018  . DOE (dyspnea on exertion) 03/30/2018  . 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 Acute renal failure ([place]) 10/14/2017  . Pulmonary embolism and infarction ([place]) 10/14/2017  . Acute metabolic encephalopathy 25/36/6440  . Cardiac arrest ([place]) 10/14/2017  . Non-STEMI (non-ST elevated myocardial infarction) ([place])   . Pulmonary embolus, left ([place])   . Acute deep vein thrombosis (DVT) of distal end of left lower extremity ([place])   . Acute deep vein thrombosis (DVT) of radial vein of right upper extremity ([place])   . Left hemiparesis ([place])   . Severe protein-calorie malnutrition ([place])   . Acute encephalopathy   . Elevated troponin   . Acute on chronic respiratory failure with hypoxia ([place])   . Cardiac arrest ([place])   . AKI (acute kidney injury) ([place])   . Acute respiratory failure with hypoxemia ([place])   . Respiratory failure ([place])   . Abdominal pain 09/04/2017  . PUD (peptic ulcer disease) 09/04/2017  . Mesenteric ischemia ([place]) 09/04/2017  . 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 Left  arm numbness 12/14/2015  . Numbness 12/14/2015  . Bilateral carotid artery stenosis 11/22/2015  . Recurrent major depressive disorder in partial remission ([place]) 10/19/2015  . Mitral valve prolapse 09/07/2015  . Vitamin D deficiency 08/31/2015  . Arterial atherosclerosis 04/10/2015  . Cataract 04/10/2015  . Cerebrovascular accident, old 04/10/2015  . Gonalgia 04/10/2015  . HLD (hyperlipidemia) 04/10/2015  . 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 Renal artery stenosis ([place]) 04/10/2015  . History of  non-ST elevation myocardial infarction (NSTEMI) 04/10/2015  . Anemia associated with acute blood loss 08/24/2014  . Thrombocytopenia ([place])   . Hypokalemia   . Essential hypertension 08/09/2014  . Chest pain 08/09/2014  . Near syncope 08/09/2014  . CKD (chronic kidney disease) stage 3, GFR 30-59 ml/min (HCC) 08/09/2014  . Tobacco abuse 08/09/2014  . Upper GI bleeding   . Gastric ulcer with hemorrhage   . Acute blood loss anemia   . Cerebral infarction ([place]) 05/16/2013  . HTN (hypertension) 05/16/2013  . 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 Acute metabolic encephalopathy 8/34/1962  . Gastric ulcer   . Hyperlipemia   . Hypertension   . Hypospadias   . MI (myocardial infarction) ([place])   . Pulmonary embolism and infarction ([place]) 10/14/2017  . Stroke ([place])   . Tobacco abuse     Past Surgical History: Past Surgical History:  Procedure Laterality Date  . APPLICATION OF WOUND VAC N/A 09/04/2017   Procedure: APPLICATION OF WOUND VAC and Exploration of Abdomen.;  Surgeon: Elam Dutch, MD;  Location: [place];  Service: Vascular;  Laterality: N/A;  . APPLICATION OF WOUND VAC N/A 09/06/2017   Procedure: APPLICATION OF WOUND VAC;  Surgeon: Georganna Skeans, MD;  Location: [place];  Service: General;  Laterality: N/A;  . APPLICATION OF WOUND VAC N/A 09/08/2017   Procedure: APPLICATION OF WOUND VAC;  Surgeon: Judeth Horn, MD;  Location: [place];  Service: General;  Laterality: N/A;  . APPLICATION OF WOUND VAC N/A 09/12/2017   Procedure: APPLICATION OF WOUND VAC  (CHANGE);  Surgeon: Judeth Horn, MD;  Location: [place];  Service: General;  Laterality: N/A;  . APPLICATION OF WOUND VAC N/A 09/14/2017   Procedure: APPLICATION OF WOUND VAC;  Surgeon: Greer Pickerel, MD;  Location: [place];  Service: General;  Laterality: N/A;  . APPLICATION OF WOUND VAC N/A 09/23/2017   Procedure: APPLICATION OF NEGATIVE PRESSURE THERAPY;  Surgeon:  Kieth Brightly Arta Bruce, MD;  Location: [place];  Service: General;  Laterality: N/A;  . BYPASS GRAFT AORTA TO AORTA  09/04/2017   Procedure: Aorta to Superior Mesinteric aorta bypass ultrason Left common Femoral/;  Surgeon: Elam Dutch, MD;  Location: [facility];  Service: Vascular;;  . ESOPHAGOGASTRODUODENOSCOPY N/A 08/09/2014   Procedure: ESOPHAGOGASTRODUODENOSCOPY (EGD);  Surgeon: Jerene Bears, MD;  [facility];  Service: Gastroenterology;  Laterality: N/A;  . ESOPHAGOGASTRODUODENOSCOPY N/A 08/10/2014   Procedure: ESOPHAGOGASTRODUODENOSCOPY (EGD);  Surgeon: Jerene Bears, MD;  Location[facility];  Service: Gastroenterology;  Laterality: N/A;  . ILEOSTOMY Right 09/06/2017   Procedure: POSSIBLE ILEOSTOMY;  Surgeon: Georganna Skeans, MD;  Location: [place];  Service: General;  Laterality: Right;  . INSERTION OF MESH N/A 09/23/2017   Procedure: INSERTION OF MESH;  Surgeon: Kinsinger, Arta Bruce, MD;  Location: [place];  Service: General;  Laterality: N/A;  . IR FLUORO GUIDE CV LINE RIGHT  10/04/2017  . IR US GUIDE VASC ACCESS RIGHT  10/04/2017  . LAPAROTOMY  Right 09/04/2017   Procedure: EXPLORATORY LAPAROTOMY Right Colon Resection;  Surgeon: Elam Dutch, MD;  Location: [place];  Service: Vascular;  Laterality: Right;  . LAPAROTOMY N/A 09/06/2017   Procedure: EXPLORATORY LAPAROTOMY;  Surgeon: Georganna Skeans, MD;  Location: [place];  Service: General;  Laterality: N/A;  . LAPAROTOMY N/A 09/08/2017   Procedure: EXPLORATORY LAPAROTOMY, ABDOMINAL WASHOUT, PARTIAL CLOSURE OF ABDOMEN;  Surgeon: Judeth Horn, MD;  Location: [place];  Service: General;  Laterality: N/A;  . LAPAROTOMY N/A 09/10/2017   Procedure: EXPLORATORY LAPAROTOMY WITH POSSIBLE WOUND CLOSURE ABDOMEN;  Surgeon: Judeth Horn, MD;  Location: [place];  Service: General;  Laterality: N/A;  . LAPAROTOMY N/A 09/12/2017   Procedure: EXPLORATORY LAPAROTOMY;  Surgeon: Judeth Horn, MD;  Location: [place];  Service: General;  Laterality: N/A;  .  LAPAROTOMY N/A 09/14/2017   Procedure: EXPLORATORY LAPAROTOMY;  Surgeon: Greer Pickerel, MD;  Location: [place];  Service: General;  Laterality: N/A;  . LAPAROTOMY N/A 09/17/2017   Procedure: EXPLORATORY LAPAROTOMY AND PARTIAL CLOSURE OF ABDOMEN;  Surgeon: Judeth Horn, MD;  Location: [place];  Service: General;  Laterality: N/A;  . LAPAROTOMY N/A 09/19/2017   Procedure: EXPLORATORY LAPAROTOMY & DRESSING CHANGE;  Surgeon: Coralie Keens, MD;  Location: [place];  Service: General;  Laterality: N/A;  . LAPAROTOMY N/A 09/23/2017   Procedure: EXPLORATORY LAPAROTOMY, INCISIONAL HERNIA REPAIR WITH MESH INSERTION, PARTIAL CLOSURE OF SKIN;  Surgeon: Kieth Brightly, Arta Bruce, MD;  Location: [place];  Service: General;  Laterality: N/A;  . TRACHEOSTOMY TUBE PLACEMENT N/A 09/12/2017   Procedure: TRACHEOSTOMY;  Surgeon: Judeth Horn, MD;  Location: [place];  Service: General;  Laterality: N/A;  . VISCERAL ANGIOGRAM N/A 09/04/2017   Procedure: Non Selective MESENTERIC ANGIOGRAM,;  Surgeon: Elam Dutch, MD;  Location: [facility] OR;  Service: Vascular;  Laterality: N/A;    Social History: Social History   Tobacco Use  . Smoking status: Former Smoker    Packs/day: 2.00    Years: 50.00    Pack years: 100.00    Types: Cigarettes    Quit date: 08/20/2017    Years since quitting: 3.0  . Smokeless tobacco: Never Used  Vaping Use  . Vaping Use: Never used  Substance Use Topics  . Alcohol use: Yes    Alcohol/week: 0.0 standard drinks    Comment: occasional  . Drug use: No    Please also refer to relevant sections of EMR.  Family History: Family History  Problem Relation Age of Onset  . Pancreatic cancer Mother   . Heart attack Father 66    Allergies and Medications: No Known Allergies No current facility-administered medications on file prior to encounter.   Current Outpatient Medications on File Prior to Encounter  Medication Sig Dispense Refill  . acetaminophen (TYLENOL) 325 MG tablet Place 2 tablets (650 mg  total) into feeding tube every 6 (six) hours. (Patient taking differently: Take 650 mg by mouth every 6 (six) hours as needed for mild pain, fever or headache.) 10 tablet 0  . amoxicillin-clavulanate (AUGMENTIN) 500-125 MG tablet Take 1 tablet (500 mg total) by mouth daily for 14 days. 14 tablet 0  . apixaban (ELIQUIS) 2.5 MG TABS tablet Take 2.5 mg by mouth 2 (two) times daily.    [name] ascorbic acid (VITAMIN C) 500 MG tablet Take 500 mg by mouth daily.    . Cholecalciferol (VITAMIN D3 ULTRA POTENCY) 1.25 MG (50000 UT) TABS Take 1.25 mg by mouth once a week. On Thursday's    . cholestyramine (  QUESTRAN) 4 g packet Take 4 g by mouth daily.    . ergocalciferol (VITAMIN D2) 1.25 MG (50000 UT) capsule Take 50,000 Units by mouth once a week. On thursdays    . fluticasone (FLONASE) 50 MCG/ACT nasal spray Place 1 spray into both nostrils daily.    [name] ipratropium-albuterol (DUONEB) 0.5-2.5 (3) MG/3ML SOLN Take 3 mLs by nebulization every 6 (six) hours as needed. 360 mL   . metoprolol tartrate (LOPRESSOR) 25 MG tablet Take 0.5 tablets (12.5 mg total) by mouth 2 (two) times daily.    . ondansetron (ZOFRAN) 4 MG tablet Take 4 mg by mouth every 8 (eight) hours as needed for nausea or vomiting.    . OXYGEN Place 2 L/min into the nose at bedtime. Nasal cannula    . Probiotic Product (PROBIOTIC PO) Take 1 tablet by mouth daily.    . sertraline (ZOLOFT) 50 MG tablet Take 50 mg by mouth daily.    . sodium bicarbonate 650 MG tablet Take 1,300 mg by mouth 3 (three) times daily.    . tamsulosin (FLOMAX) 0.4 MG CAPS capsule Take 0.4 mg by mouth daily.      Objective: BP (!) 78/44 (BP Location: Left Arm)   Pulse 65   Temp (!) 97.3 F (36.3 C) (Temporal)   Resp 20   Ht 5\' 8"  (1.727 m)   Wt 98 kg   SpO2 93%   BMI 32.85 kg/m  Exam: General: Awake, chronically ill-appearing, on BiPAP, speaking softly in short 1-2 word phrases Eyes: Sclera anicteric ENTM: Dry mucous membranes Neck: Supple Cardiovascular:  Irregularly irregular rate and rhythm Respiratory: Anterior lung fields examined, inspiratory rhonchi, decreased air movement Gastrointestinal: Large dressing on abdomen  MSK: Moves right side of body spontaneously, no obvious deformities Derm: Warm and dry Neuro: Chronic left arm flaccid paralysis from prior CVA Psych: Appropriate mood  Labs and Imaging: CBC BMET  Recent Labs  Lab 09/04/20 0100  WBC 15.5*  HGB 9.2*  HCT 27.8*  PLT 131*   Recent Labs  Lab 09/04/20 0100  NA 134*  K 4.0  CL 105  CO2 17*  BUN 94*  CREATININE 5.45*  GLUCOSE 97  CALCIUM 7.9*     EKG: Sinus rhythm, rate 92, PVC's present   Sharion Settler, DO 09/05/2020, 4:28 AM PGY-1, [place] Intern pager: [phone], text pages welcome

## 2020-09-05 NOTE — Progress Notes (Signed)
[facility] arrived to transport patient to rehab.VSS stable and IVs have been removed. DO has been made aware of patient's departure.

## 2020-09-05 NOTE — Hospital Course (Addendum)
Luke Kaufman is a 70 y.o. male presenting with shortness of breath and admitted for acute on chronic hypoxic respiratory failure secondary to pulmonary edema.  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 Fib arrest, PVD, CVA, hypertension, hyperlipidemia, tobacco use, chronic hypoxic respiratory failure on oxygen at night, PUD with GI bleed, history of DVT and PE.  Acute on chronic hypoxic respiratory failure (2L at home prn) 2/2 pulmonary edema Hypoxic to 87% and in mild respiratory distress on arrival to ED, placed on Bi-PAP with improvement in oxygen saturations. CXR concerning for possible re-expansion of pulmonary edema. Labs work deferred in ED, however patient is amenable to this. Patient was discharged a few hours prior to arrival. He was hospitalized for acute renal failure and sepsis likely from aspiration pneumonia. He required supplemental oxygen and declined BiPAP at that time. MRSA nasal swab positive at that time and he was treated with cefepime and vanc from 5/12-5/16.  Suspect that this is an exacerbation of prior symptoms. Continued back on IV antibiotics and continue respiratory support.    Goals of Care Palliative Care consulted during most recent admission and clarified goals of care. Goals of care also discussed further with inpatient team. Patient decided on comfort measures only and declined dialysis, pressors, surgery or escalation of care if he decompensates. He was discharged back to his long-term care facility with hospice but returned via [facility] prior to arriving at his facility.  Patient tells me that he is agreeable to be admitted for continued respiratory support.  He is also agreeable to IV antibiotics and blood work.  He is aware of his chronic conditions and likelihood of poor prognosis.  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  Creatinine on discharge 5.45 (baseline  appears to be 2-3).  Patient was started on sodium bicarb last admission.  He does not desire starting hemodialysis.    Hypotension BP ranging 78-145/44/96. Held Metoprolol 12.5mg  during admission.    ? Bacteremia, likely contaminant  1/4 of the blood cultures drawn on 5/11 grew gram positive rods. This is likely a contaminant. The other set of blood cultures showed no growth in 5 days.   Paroxysmal Atrial Fibrillation CHA2DS2-VASc score of 5. Cardiology consulted on prior admission and patient was started on Metoprolol 12.5 mg BID for rate control. Discharged on Eliquis. Cardiology recommended amiodarone if recurrent afib with RVR or to up-titrate beta blocker. During my encounter HR irregularly irregular in 90's and low 100's. He is asymptomatic. Given his hypotension, held metoprolol at this time.     Gallstones  Acute Cholecystitis  Diagnosed by HIDA on recent admission. Surgery consulted at that time and plan was to finish 14-day course of abx. Patient is not a surgical candidate nor does he desire surgery. Started on IV Metronidazole 5/13 but ultimately discharged with instructions to take Augmentin x2 weeks. Restarted Metronidazole at admission.   All other chronic conditions stable

## 2020-09-05 NOTE — Consult Note (Signed)
Consultation Note Date: 09/05/2020   Patient Name: [facility]  DOB: 16-Oct-1950  MRN: [id number]  Age / Sex: 70 y.o., male  PCP: [facility] Referring Physician: McDiarmid, Blane Ohara, [facility]  Reason for Consultation: goals of care, "end of life planning"  HPI/Patient Profile: 70 y.o. 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 He presented to the emergency department on 09/05/2020 with shortness of breath. He was just discharged back to SNF after being hospitalized 5/11--5/17 with acute on chronic renal failure, acute cholecystitis, sepsis, and aspiration pneumonia.   Of note, patient is followed by [facility] services.   Clinical Assessment and Goals of Care: I have reviewed medical records including EPIC notes, labs and imaging, and met at bedside with patient to discuss diagnosis, prognosis, GOC, EOL wishes, disposition, and options. He denies pain shortness of breath. However, his breathing appears somewhat labored during my visit.   Patient is known to PMT from his recent hospitalization. I re-introduced Palliative Medicine as specialized medical care for people living with serious illness. It focuses on providing relief from the symptoms and stress of a serious illness.   We discussed a brief life review of the patient. He has resided at [facility] since [facility]. He shares that he has no family - his son, mother, and sister have all passed away. He does mention his roommate, [facility], has become a very close friend.   As far as functional status, he was able to ambulate with a walker. He does require assistance with bathing and  toileting.    We discussed his current illness and what it means in the larger context of his ongoing co-morbidities.  Natural trajectory at EOL was discussed.  Patient shares that he thought he was going to die last night. I asked him how that felt - he states that he did not feel scared. He states that he has lived a good life with no regrets.   The difference between aggressive medical intervention and comfort care was discussed. I reviewed the concept of a comfort path, emphasizing it means stopping or de-escalating full scope medical interventions with the goal of comfort rather than prolonging life. Reviewed hospice philosophy and provided information on services at SNF vs residential hospice services - answered all questions.   Patient clearly understands he is at EOL. He states "this is an old conversation" and he is tired of talking about it. For now, he prefers to continue all current treatments, stating "leave things as they are". If he can be (relatively) medically stabilized, he would prefer to return to [facility] with hospice care for his EOL transition.    Patient does request that someone from the medical team speak with his roommate [facility] - per Dr. Hulen Kaufman note, he has obtained [facility] phone  number and will contact him.   Primary decision maker: patient    SUMMARY OF RECOMMENDATIONS    DNR/DNI as previously documented  Continue current medical care - oxygen, IV antibiotics  No escalation of care  Goal of care is to return to SNF with hospice  PMT will continue to follow  Code Status/Advance Care Planning:  DNR  Symptom Management:   Per primary team  Palliative Prophylaxis:   Frequent Pain Assessment and Turn Reposition  Additional Recommendations (Limitations, Scope, Preferences):  No Hemodialysis  Psycho-social/Spiritual:   Created space and opportunity for patient and family to express thoughts and feelings regarding patient's current  medical situation.   Emotional support provided  Prognosis:   poor  Discharge Planning: goal is SNF with hospice      Primary Diagnoses: Present on Admission: . Aspiration pneumonia ([place]) . Acute cholecystitis . Leukocytosis . History of pulmonary embolism . DOE (dyspnea on exertion) . Edema of left upper arm . Acute kidney injury (AKI) with acute tubular necrosis (ATN) (HCC) . Left hemiparesis ([facility]) . Possible mucus plugging of bronchi   I have reviewed the medical record, interviewed the patient and family, and examined the patient. The following aspects are pertinent.  Past Medical History:  Diagnosis Date  . Acute deep vein thrombosis (DVT) of distal end of left lower extremity ([facility])   . Acute deep vein thrombosis (DVT) of radial vein of right upper extremity ([facility])   . Acute encephalopathy   . Acute metabolic encephalopathy 3/53/6144  . Gastric ulcer   . History of pulmonary embolism 10/14/2017  . Hyperlipemia   . Hypertension   . Hypospadias   . MI (myocardial infarction) ([facility])   . Pulmonary embolism and infarction ([facility]) 10/14/2017  . Stroke ([facility])   . Tobacco abuse    Social History    Family History  Problem Relation Age of Onset  . Pancreatic cancer Mother   . Heart attack Father 59   Scheduled Meds: . [START ON 09/06/2020] amoxicillin-clavulanate  1 tablet Oral Daily  . apixaban  2.5 mg Oral BID  . sertraline  50 mg Oral Daily  . sodium bicarbonate  1,300 mg Oral TID   Continuous Infusions:  PRN Meds:.acetaminophen **OR** acetaminophen, ipratropium-albuterol, nicotine, polyethylene glycol, spiritus frumenti Medications Prior to Admission:  Prior to Admission medications   Medication Sig Start Date End Date Taking? Authorizing Provider  acetaminophen (TYLENOL) 325 MG tablet Place 2 tablets (650 mg total) into feeding tube every 6 (six) hours. Patient taking differently: Take 650 mg by mouth every 6 (six) hours as needed for mild pain, fever or  headache. 10/09/17   Allie Bossier, [facility]  amoxicillin-clavulanate (AUGMENTIN) 500-125 MG tablet Take 1 tablet (500 mg total) by mouth daily for 14 days. 09/04/20 09/18/20  Carollee Leitz, [facility]  apixaban (ELIQUIS) 2.5 MG TABS tablet Take 2.5 mg by mouth 2 (two) times daily.    Provider, Historical, [facility]  ascorbic acid (VITAMIN C) 500 MG tablet Take 500 mg by mouth daily.    Provider, Historical, [facility]  Cholecalciferol (VITAMIN D3 ULTRA POTENCY) 1.25 MG (50000 UT) TABS Take 1.25 mg by mouth once a week. On Thursday's    Provider, Historical, [facility]  cholestyramine (QUESTRAN) 4 g packet Take 4 g by mouth daily.    Provider, Historical, [facility]  ergocalciferol (VITAMIN D2) 1.25 MG (50000 UT) capsule Take 50,000 Units by mouth once a week. On thursdays    Provider, Historical, [facility]  fluticasone (FLONASE) 50 MCG/ACT nasal spray Place  1 spray into both nostrils daily.    Provider, Historical, [facility]  ipratropium-albuterol (DUONEB) 0.5-2.5 (3) MG/3ML SOLN Take 3 mLs by nebulization every 6 (six) hours as needed. 09/04/20   Gifford Shave, [facility]  metoprolol tartrate (LOPRESSOR) 25 MG tablet Take 0.5 tablets (12.5 mg total) by mouth 2 (two) times daily. 09/04/20   Gifford Shave, [facility]  ondansetron (ZOFRAN) 4 MG tablet Take 4 mg by mouth every 8 (eight) hours as needed for nausea or vomiting.    Provider, Historical, [facility]aufman  OXYGEN Place 2 L/min into the nose at bedtime. Nasal cannula    Provider, Historical, [facility]  Probiotic Product (PROBIOTIC PO) Take 1 tablet by mouth daily.    Provider, Historical, [facility]  sertraline (ZOLOFT) 50 MG tablet Take 50 mg by mouth daily. 08/01/20   Provider, Historical, [facility]  sodium bicarbonate 650 MG tablet Take 1,300 mg by mouth 3 (three) times daily.    Provider, Historical, [facility]  tamsulosin (FLOMAX) 0.4 MG CAPS capsule Take 0.4 mg by mouth daily.    Provider, Historical, [facility]   No Known Allergies Review of Systems  Respiratory: Negative for shortness of breath.     Physical Exam Vitals reviewed.  Constitutional:       General: He is not in acute distress.    Appearance: He is ill-appearing.  Cardiovascular:     Rate and Rhythm: Normal rate.  Pulmonary:     Effort: Accessory muscle usage present.     Comments: Mildly labored breathing Neurological:     Mental Status: He is alert and oriented to person, place, and time.     Motor: Weakness present.     Vital Signs: BP (!) 110/42 (BP Location: Right Arm)   Pulse 100   Temp 98 F (36.7 C) (Oral)   Resp (!) 22   Ht '5\' 8"'  (1.727 m)   Wt 98 kg   SpO2 93%   BMI 32.85 kg/m  Pain Scale: 0-10   Pain Score: 0-No pain   SpO2: SpO2: 93 % O2 Device:SpO2: 93 % O2 Flow Rate: .   IO: Intake/output summary:   Intake/Output Summary (Last 24 hours) at 09/05/2020 1946 Last data filed at 09/05/2020 1803 Gross per 24 hour  Intake 780 ml  Output 200 ml  Net 580 ml    LBM: Last BM Date: 09/05/20 Baseline Weight: Weight: 98 kg Most recent weight: Weight: 98 kg      Palliative Assessment/Data: PPS 30%      Time In: 12:00 Time Out: 12:52 Time Total: 52 minutes Greater than 50%  of this time was spent counseling and coordinating care related to the above assessment and plan.  Signed by: Lavena Bullion, NP   Please contact Palliative Medicine Team phone at [phone] for questions and concerns.  For individual provider: See Shea Evans

## 2020-09-05 NOTE — Progress Notes (Signed)
Manufacturing engineer [facility])  Met with [facility] at the bedside. He is alert and oriented. He states when he arrived at his facility, he became increasingly SOB and asked the facility to send him back to the hospital.  [facility] previously indicated that he wanted to leave and not finish the recommended course of abx so he could be at his facility with his family. He has no living family. His roommate is [facility], whom he considers family. Luke [facility]man has a dtr names Luke [facility] who is [facility].  For now, Luke K[facility]man is agreeable to be re-admitted and medically optimized before returning to his facility. Inquired if he had completed a MOST form in the past, he indicated he had but was not sure what was on it.  He may benefit from a PMT consult.  [facility] will continue to follow and support/help plan for dc.  [name] RN, BSN, [facility] Liaison

## 2020-09-05 NOTE — ED Notes (Signed)
Messaged pharmacy for replacement dose of apaxiban.

## 2020-09-05 NOTE — ED Provider Notes (Signed)
[facility] EMERGENCY DEPARTMENT Provider Note   CSN: [id number] Arrival date & time: 09/05/20  0316     History Chief Complaint  Patient presents with  . Respiratory Distress    Zacari Stiff is a 70 y.o. male.  Patient presents to the emergency department for evaluation of shortness of breath.  Patient was just hospitalized for acute kidney injury and discharged tonight.  When he arrived home with PTAR he developed sudden shortness of breath.  EMS was called and found the patient hypoxic.  He was brought back to the hospital on CPAP.  Oxygenation has improved.        Past Medical History:  Diagnosis Date  . Acute metabolic encephalopathy 6/37/8588  . Gastric ulcer   . Hyperlipemia   . Hypertension   . Hypospadias   . MI (myocardial infarction) ([place])   . Pulmonary embolism and infarction ([place]) 10/14/2017  . Stroke ([place])   . Tobacco abuse     Patient Active Problem List   Diagnosis Date Noted  . Dyspnea   . Acute renal failure (ARF) ([place]) 08/31/2020  . Transaminitis   . Non-intractable vomiting   . Metabolic acidosis 50/27/7412  . Acute kidney injury (AKI) with acute tubular necrosis (ATN) ([place]) 11/11/2018  . Upper airway cough syndrome 03/31/2018  . DOE (dyspnea on exertion) 03/30/2018  . 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 Acute renal failure ([place]) 10/14/2017  . Pulmonary embolism and infarction ([place]) 10/14/2017  . Acute metabolic encephalopathy 87/86/7672  . Cardiac arrest ([place]) 10/14/2017  . Non-STEMI (non-ST elevated myocardial infarction) ([place])   . Pulmonary embolus, left ([place])   . Acute deep vein thrombosis (DVT) of distal end of left lower extremity ([place])   . Acute deep vein thrombosis (DVT) of radial vein of right upper extremity ([place])   . Left hemiparesis ([place])   . Severe protein-calorie malnutrition ([place])   . Acute encephalopathy   . Elevated troponin   . Acute on chronic respiratory failure with hypoxia  ([place])   . Cardiac arrest ([place])   . AKI (acute kidney injury) ([place])   . Acute respiratory failure with hypoxemia ([place])   . Respiratory failure ([place])   . Abdominal pain 09/04/2017  . PUD (peptic ulcer disease) 09/04/2017  . Mesenteric ischemia ([place]) 09/04/2017  . 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 Left arm numbness 12/14/2015  . Numbness 12/14/2015  . Bilateral carotid artery stenosis 11/22/2015  . Recurrent major depressive disorder in partial remission ([place]) 10/19/2015  . Mitral valve prolapse 09/07/2015  . Vitamin D deficiency 08/31/2015  . Arterial atherosclerosis 04/10/2015  . Cataract 04/10/2015  . Cerebrovascular accident, old 04/10/2015  . Gonalgia 04/10/2015  . HLD (hyperlipidemia) 04/10/2015  . 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 Renal artery stenosis ([employer]) 04/10/2015  . History of non-ST elevation myocardial infarction (NSTEMI) 04/10/2015  . Anemia associated with acute blood loss 08/24/2014  . Thrombocytopenia ([place])   . Hypokalemia   . Essential hypertension 08/09/2014  . Chest pain 08/09/2014  . Near syncope 08/09/2014  . CKD (chronic kidney disease) stage 3, GFR 30-59 ml/min (HCC) 08/09/2014  . Tobacco abuse 08/09/2014  . Upper GI bleeding   . Gastric ulcer with hemorrhage   . Acute blood loss anemia   . Cerebral infarction ([place]) 05/16/2013  . HTN (hypertension) 05/16/2013  . 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    Past Surgical History:  Procedure Laterality Date  . APPLICATION OF WOUND VAC N/A 09/04/2017  Procedure: APPLICATION OF WOUND VAC and Exploration of Abdomen.;  Surgeon: Elam Dutch, MD;  Location: [facility];  Service: Vascular;  Laterality: N/A;  . APPLICATION OF WOUND VAC N/A 09/06/2017   Procedure: APPLICATION OF WOUND VAC;  Surgeon: Georganna Skeans, MD;  Location: [place];  Service: General;  Laterality: N/A;  . APPLICATION OF WOUND VAC N/A 09/08/2017   Procedure: APPLICATION OF WOUND VAC;  Surgeon: Judeth Horn, MD;   Location: [place];  Service: General;  Laterality: N/A;  . APPLICATION OF WOUND VAC N/A 09/12/2017   Procedure: APPLICATION OF WOUND VAC  (CHANGE);  Surgeon: Judeth Horn, MD;  Location: [place];  Service: General;  Laterality: N/A;  . APPLICATION OF WOUND VAC N/A 09/14/2017   Procedure: APPLICATION OF WOUND VAC;  Surgeon: Greer Pickerel, MD;  Location: [place];  Service: General;  Laterality: N/A;  . APPLICATION OF WOUND VAC N/A 09/23/2017   Procedure: APPLICATION OF NEGATIVE PRESSURE THERAPY;  Surgeon: Kieth Brightly Arta Bruce, MD;  Location: [place];  Service: General;  Laterality: N/A;  . BYPASS GRAFT AORTA TO AORTA  09/04/2017   Procedure: Aorta to Superior Mesinteric aorta bypass ultrason Left common Femoral/;  Surgeon: Elam Dutch, MD;  Location: [facility] OR;  Service: Vascular;;  . ESOPHAGOGASTRODUODENOSCOPY N/A 08/09/2014   Procedure: ESOPHAGOGASTRODUODENOSCOPY (EGD);  Surgeon: Jerene Bears, MD;  [facility];  Service: Gastroenterology;  Laterality: N/A;  . ESOPHAGOGASTRODUODENOSCOPY N/A 08/10/2014   Procedure: ESOPHAGOGASTRODUODENOSCOPY (EGD);  Surgeon: Jerene Bears, MD;  Location: [facility];  Service: Gastroenterology;  Laterality: N/A;  . ILEOSTOMY Right 09/06/2017   Procedure: POSSIBLE ILEOSTOMY;  Surgeon: Georganna Skeans, MD;  Location: [place];  Service: General;  Laterality: Right;  . INSERTION OF MESH N/A 09/23/2017   Procedure: INSERTION OF MESH;  Surgeon: Kinsinger, Arta Bruce, MD;  Location: [place];  Service: General;  Laterality: N/A;  . IR FLUORO GUIDE CV LINE RIGHT  10/04/2017  . IR US GUIDE VASC ACCESS RIGHT  10/04/2017  . LAPAROTOMY Right 09/04/2017   Procedure: EXPLORATORY LAPAROTOMY Right Colon Resection;  Surgeon: Elam Dutch, MD;  Location: [place];  Service: Vascular;  Laterality: Right;  . LAPAROTOMY N/A 09/06/2017   Procedure: EXPLORATORY LAPAROTOMY;  Surgeon: Georganna Skeans, MD;  Location: [place];  Service: General;  Laterality: N/A;  . LAPAROTOMY N/A 09/08/2017    Procedure: EXPLORATORY LAPAROTOMY, ABDOMINAL WASHOUT, PARTIAL CLOSURE OF ABDOMEN;  Surgeon: Judeth Horn, MD;  Location: [place];  Service: General;  Laterality: N/A;  . LAPAROTOMY N/A 09/10/2017   Procedure: EXPLORATORY LAPAROTOMY WITH POSSIBLE WOUND CLOSURE ABDOMEN;  Surgeon: Judeth Horn, MD;  Location: [place];  Service: General;  Laterality: N/A;  . LAPAROTOMY N/A 09/12/2017   Procedure: EXPLORATORY LAPAROTOMY;  Surgeon: Judeth Horn, MD;  Location: [place];  Service: General;  Laterality: N/A;  . LAPAROTOMY N/A 09/14/2017   Procedure: EXPLORATORY LAPAROTOMY;  Surgeon: Greer Pickerel, MD;  Location: [place];  Service: General;  Laterality: N/A;  . LAPAROTOMY N/A 09/17/2017   Procedure: EXPLORATORY LAPAROTOMY AND PARTIAL CLOSURE OF ABDOMEN;  Surgeon: Judeth Horn, MD;  Location: [place];  Service: General;  Laterality: N/A;  . LAPAROTOMY N/A 09/19/2017   Procedure: EXPLORATORY LAPAROTOMY & DRESSING CHANGE;  Surgeon: Coralie Keens, MD;  Location: [place];  Service: General;  Laterality: N/A;  . LAPAROTOMY N/A 09/23/2017   Procedure: EXPLORATORY LAPAROTOMY, INCISIONAL HERNIA REPAIR WITH MESH INSERTION, PARTIAL CLOSURE OF SKIN;  Surgeon: Kieth Brightly Arta Bruce, MD;  Location: [place];  Service: General;  Laterality:  N/A;  . TRACHEOSTOMY TUBE PLACEMENT N/A 09/12/2017   Procedure: TRACHEOSTOMY;  Surgeon: Judeth Horn, MD;  Location: [place];  Service: General;  Laterality: N/A;  . VISCERAL ANGIOGRAM N/A 09/04/2017   Procedure: Non Selective MESENTERIC ANGIOGRAM,;  Surgeon: Elam Dutch, MD;  Location[facility] OR;  Service: Vascular;  Laterality: N/A;       Family History  Problem Relation Age of Onset  . Pancreatic cancer Mother   . Heart attack Father 52    Social History   Tobacco Use  . Smoking status: Former Smoker    Packs/day: 2.00    Years: 50.00    Pack years: 100.00    Types: Cigarettes    Quit date: 08/20/2017    Years since quitting: 3.0  . Smokeless tobacco: Never Used  Vaping Use  . Vaping  Use: Never used  Substance Use Topics  . Alcohol use: Yes    Alcohol/week: 0.0 standard drinks    Comment: occasional  . Drug use: No    Home Medications Prior to Admission medications   Medication Sig Start Date End Date Taking? Authorizing Provider  acetaminophen (TYLENOL) 325 MG tablet Place 2 tablets (650 mg total) into feeding tube every 6 (six) hours. Patient taking differently: Take 650 mg by mouth every 6 (six) hours as needed for mild pain, fever or headache. 10/09/17   Allie Bossier, MD  amoxicillin-clavulanate (AUGMENTIN) 500-125 MG tablet Take 1 tablet (500 mg total) by mouth daily for 14 days. 09/04/20 09/18/20  Carollee Leitz, MD  apixaban (ELIQUIS) 2.5 MG TABS tablet Take 2.5 mg by mouth 2 (two) times daily.    [provider]  ascorbic acid (VITAMIN C) 500 MG tablet Take 500 mg by mouth daily.    [provider]  Cholecalciferol (VITAMIN D3 ULTRA POTENCY) 1.25 MG (50000 UT) TABS Take 1.25 mg by mouth once a week. On Thursday's    [provider]  cholestyramine (QUESTRAN) 4 g packet Take 4 g by mouth daily.    [provider]  ergocalciferol (VITAMIN D2) 1.25 MG (50000 UT) capsule Take 50,000 Units by mouth once a week. On thursdays    [provider]  fluticasone (FLONASE) 50 MCG/ACT nasal spray Place 1 spray into both nostrils daily.    [provider]  ipratropium-albuterol (DUONEB) 0.5-2.5 (3) MG/3ML SOLN Take 3 mLs by nebulization every 6 (six) hours as needed. 09/04/20   Gifford Shave, MD  metoprolol tartrate (LOPRESSOR) 25 MG tablet Take 0.5 tablets (12.5 mg total) by mouth 2 (two) times daily. 09/04/20   Gifford Shave, MD  ondansetron (ZOFRAN) 4 MG tablet Take 4 mg by mouth every 8 (eight) hours as needed for nausea or vomiting.    [provider]  OXYGEN Place 2 L/min into the nose at bedtime. Nasal cannula    [provider]  Probiotic Product (PROBIOTIC PO) Take 1 tablet by mouth daily.     [provider]  sertraline (ZOLOFT) 50 MG tablet Take 50 mg by mouth daily. 08/01/20   [provider]  sodium bicarbonate 650 MG tablet Take 1,300 mg by mouth 3 (three) times daily.    [provider]  tamsulosin (FLOMAX) 0.4 MG CAPS capsule Take 0.4 mg by mouth daily.    [provider]    Allergies    Patient has no known allergies.  Review of Systems   Review of Systems  Respiratory: Positive for shortness of breath.   All other systems reviewed and  are negative.   Physical Exam Updated Vital Signs BP (!) 78/44 (BP Location: Left Arm)   Pulse 65   Temp (!) 97.3 F (36.3 C) (Temporal)   Resp 20   Ht 5\' 8"  (1.727 m)   Wt 98 kg   SpO2 93%   BMI 32.85 kg/m   Physical Exam Vitals and nursing note reviewed.  Constitutional:      General: He is in acute distress.     Appearance: Normal appearance. He is well-developed.  HENT:     Head: Normocephalic and atraumatic.     Right Ear: Hearing normal.     Left Ear: Hearing normal.     Nose: Nose normal.  Eyes:     Conjunctiva/sclera: Conjunctivae normal.     Pupils: Pupils are equal, round, and reactive to light.  Cardiovascular:     Rate and Rhythm: Regular rhythm.     Heart sounds: S1 normal and S2 normal. No murmur heard. No friction rub. No gallop.   Pulmonary:     Effort: Accessory muscle usage and respiratory distress present.     Breath sounds: Decreased breath sounds, rhonchi and rales present.  Chest:     Chest wall: No tenderness.  Abdominal:     General: Bowel sounds are normal.     Palpations: Abdomen is soft.     Tenderness: There is no abdominal tenderness. There is no guarding or rebound. Negative signs include Murphy's sign and McBurney's sign.     Hernia: No hernia is present.  Musculoskeletal:        General: Normal range of motion.     Cervical back: Normal range of motion and neck supple.     Right lower leg: 1+ Pitting Edema present.     Left lower leg: 1+  Pitting Edema present.  Skin:    General: Skin is warm and dry.     Findings: No rash.  Neurological:     Mental Status: He is alert and oriented to person, place, and time.     GCS: GCS eye subscore is 4. GCS verbal subscore is 5. GCS motor subscore is 6.     Cranial Nerves: No cranial nerve deficit.     Sensory: No sensory deficit.     Coordination: Coordination normal.  Psychiatric:        Speech: Speech normal.        Behavior: Behavior normal.        Thought Content: Thought content normal.     ED Results / Procedures / Treatments   Labs (all labs ordered are listed, but only abnormal results are displayed) Labs Reviewed - No data to display  EKG None  Radiology DG Chest [facility] 1 View  Result Date: 09/05/2020 CLINICAL DATA:  Shortness of breath EXAM: PORTABLE CHEST 1 VIEW COMPARISON:  09/02/2020 FINDINGS: Decreased size of right pleural effusion. Opacities at the right lung base may indicate re-expansion pulmonary edema. Small left pleural effusion is unchanged. Cardiomediastinal contours are normal. IMPRESSION: Decreased size of right pleural effusion with possible re-expansion pulmonary edema. Electronically Signed   By: Ulyses Jarred M.D.   On: 09/05/2020 03:50    Procedures Procedures   Medications Ordered in ED Medications  furosemide (LASIX) injection 20 mg (has no administration in time range)    ED Course  I have reviewed the triage vital signs and the nursing notes.  Pertinent labs & imaging results that were available during my care of the patient were reviewed by me and  considered in my medical decision making (see chart for details).    MDM Rules/Calculators/A&P                          Patient just discharged from the hospital, upon arrival at his home became short of breath.  Brought back to the hospital on CPAP because of respiratory distress.  Patient with diffuse rales at arrival.  X-ray consistent with pulmonary edema.  Patient has a MOST form  that was filled out during his hospitalization.  He is comfort measures only.  Because of this, no labs obtained.  We will give low-dose of Lasix to help with his dyspnea, continue BiPAP.  Final Clinical Impression(s) / ED Diagnoses Final diagnoses:  Acute pulmonary edema [facility])    Rx / DC Orders ED Discharge Orders    None       Kiko Ripp, Gwenyth Allegra, MD 09/05/20 0405

## 2020-09-05 NOTE — Progress Notes (Signed)
Manufacturing engineer [facility])  Noted pt was d/c'd back to his facility and returned to hospital.  Will come by and see pt.  [name] RN, BSN, [facility] Liaison

## 2020-09-05 NOTE — Progress Notes (Signed)
Pharmacy Antibiotic Note  Luke Kaufman is a 70 y.o. male admitted on 09/05/2020 with sepsis.  Pharmacy has been consulted for vancomycin and cefepime dosing.Patient completed 5 days IV Abx 5-12 to 5/16.  Random vanc level 17 mcg/ml on 5/16 @0100   Plan: Cefepime 2gm IV x 1 Vancomyin 500 mg IV x 1 F/u admission orders  Height: 5\' 8"  (172.7 cm) Weight: 98 kg (216 lb 0.8 oz) IBW/kg (Calculated) : 68.4  Temp (24hrs), Avg:98.4 F (36.9 C), Min:97.3 F (36.3 C), Max:98.7 F (37.1 C)  Recent Labs  Lab 08/30/20 1249 08/30/20 2207 08/31/20 1347 09/01/20 0518 09/01/20 0844 09/01/20 1827 09/02/20 0117 09/03/20 0243 09/04/20 0100  WBC  --    < > 4.1 8.4  --   --  10.7* 14.5* 15.5*  CREATININE  --    < >  --   --  7.14* 6.76* 6.54* 6.01* 5.45*  LATICACIDVEN 0.7  --  1.6  --   --   --   --   --   --   VANCORANDOM  --   --   --   --   --   --  20  --  17   < > = values in this interval not displayed.    Estimated Creatinine Clearance: 14.3 mL/min (A) (by C-G formula based on SCr of 5.45 mg/dL (H)).    No Known Allergies  Thank you for allowing pharmacy to be a part of this patient's care.  Excell Seltzer Poteet 09/05/2020 6:14 AM

## 2020-09-05 NOTE — ED Notes (Signed)
Attempted to give reportx1 

## 2020-09-05 NOTE — ED Notes (Signed)
I called [facility] to inform them Dr McDiarmid would like pt weaned off of bipap and they said they would come and trial him off.

## 2020-09-06 ENCOUNTER — Other Ambulatory Visit: Payer: Self-pay

## 2020-09-06 DIAGNOSIS — I69334 Monoplegia of upper limb following cerebral infarction affecting left non-dominant side: Secondary | ICD-10-CM | POA: Diagnosis not present

## 2020-09-06 DIAGNOSIS — I2510 Atherosclerotic heart disease of native coronary artery without angina pectoris: Secondary | ICD-10-CM | POA: Diagnosis present

## 2020-09-06 DIAGNOSIS — J690 Pneumonitis due to inhalation of food and vomit: Secondary | ICD-10-CM | POA: Diagnosis present

## 2020-09-06 DIAGNOSIS — I739 Peripheral vascular disease, unspecified: Secondary | ICD-10-CM | POA: Diagnosis present

## 2020-09-06 DIAGNOSIS — J9621 Acute and chronic respiratory failure with hypoxia: Secondary | ICD-10-CM | POA: Diagnosis present

## 2020-09-06 DIAGNOSIS — Z20822 Contact with and (suspected) exposure to covid-19: Secondary | ICD-10-CM | POA: Diagnosis present

## 2020-09-06 DIAGNOSIS — J810 Acute pulmonary edema: Secondary | ICD-10-CM | POA: Diagnosis present

## 2020-09-06 DIAGNOSIS — N400 Enlarged prostate without lower urinary tract symptoms: Secondary | ICD-10-CM | POA: Diagnosis present

## 2020-09-06 DIAGNOSIS — Z9981 Dependence on supplemental oxygen: Secondary | ICD-10-CM | POA: Diagnosis not present

## 2020-09-06 DIAGNOSIS — N185 Chronic kidney disease, stage 5: Secondary | ICD-10-CM | POA: Diagnosis present

## 2020-09-06 DIAGNOSIS — I120 Hypertensive chronic kidney disease with stage 5 chronic kidney disease or end stage renal disease: Secondary | ICD-10-CM | POA: Diagnosis present

## 2020-09-06 DIAGNOSIS — Z7901 Long term (current) use of anticoagulants: Secondary | ICD-10-CM | POA: Diagnosis not present

## 2020-09-06 DIAGNOSIS — R609 Edema, unspecified: Secondary | ICD-10-CM | POA: Diagnosis not present

## 2020-09-06 DIAGNOSIS — F17210 Nicotine dependence, cigarettes, uncomplicated: Secondary | ICD-10-CM | POA: Diagnosis present

## 2020-09-06 DIAGNOSIS — A419 Sepsis, unspecified organism: Secondary | ICD-10-CM | POA: Diagnosis present

## 2020-09-06 DIAGNOSIS — G8194 Hemiplegia, unspecified affecting left nondominant side: Secondary | ICD-10-CM | POA: Diagnosis not present

## 2020-09-06 DIAGNOSIS — K810 Acute cholecystitis: Secondary | ICD-10-CM | POA: Diagnosis not present

## 2020-09-06 DIAGNOSIS — I480 Paroxysmal atrial fibrillation: Secondary | ICD-10-CM | POA: Diagnosis present

## 2020-09-06 DIAGNOSIS — Z66 Do not resuscitate: Secondary | ICD-10-CM | POA: Diagnosis present

## 2020-09-06 DIAGNOSIS — R0600 Dyspnea, unspecified: Secondary | ICD-10-CM | POA: Diagnosis not present

## 2020-09-06 DIAGNOSIS — G4733 Obstructive sleep apnea (adult) (pediatric): Secondary | ICD-10-CM | POA: Diagnosis present

## 2020-09-06 DIAGNOSIS — I959 Hypotension, unspecified: Secondary | ICD-10-CM | POA: Diagnosis present

## 2020-09-06 DIAGNOSIS — N179 Acute kidney failure, unspecified: Secondary | ICD-10-CM | POA: Diagnosis not present

## 2020-09-06 DIAGNOSIS — R0603 Acute respiratory distress: Secondary | ICD-10-CM | POA: Diagnosis present

## 2020-09-06 DIAGNOSIS — Z7189 Other specified counseling: Secondary | ICD-10-CM | POA: Diagnosis not present

## 2020-09-06 DIAGNOSIS — K8000 Calculus of gallbladder with acute cholecystitis without obstruction: Secondary | ICD-10-CM | POA: Diagnosis present

## 2020-09-06 DIAGNOSIS — D631 Anemia in chronic kidney disease: Secondary | ICD-10-CM | POA: Diagnosis present

## 2020-09-06 DIAGNOSIS — Z515 Encounter for palliative care: Secondary | ICD-10-CM | POA: Diagnosis not present

## 2020-09-06 DIAGNOSIS — E8770 Fluid overload, unspecified: Secondary | ICD-10-CM | POA: Diagnosis present

## 2020-09-06 DIAGNOSIS — F32A Depression, unspecified: Secondary | ICD-10-CM | POA: Diagnosis present

## 2020-09-06 DIAGNOSIS — I341 Nonrheumatic mitral (valve) prolapse: Secondary | ICD-10-CM | POA: Diagnosis present

## 2020-09-06 DIAGNOSIS — E785 Hyperlipidemia, unspecified: Secondary | ICD-10-CM | POA: Diagnosis present

## 2020-09-06 LAB — COMPREHENSIVE METABOLIC PANEL WITH GFR
ALT: 67 U/L — ABNORMAL HIGH (ref 0–44)
AST: 110 U/L — ABNORMAL HIGH (ref 15–41)
Albumin: 1.6 g/dL — ABNORMAL LOW (ref 3.5–5.0)
Alkaline Phosphatase: 250 U/L — ABNORMAL HIGH (ref 38–126)
Anion gap: 17 — ABNORMAL HIGH (ref 5–15)
BUN: 101 mg/dL — ABNORMAL HIGH (ref 8–23)
CO2: 15 mmol/L — ABNORMAL LOW (ref 22–32)
Calcium: 8.2 mg/dL — ABNORMAL LOW (ref 8.9–10.3)
Chloride: 103 mmol/L (ref 98–111)
Creatinine, Ser: 4.45 mg/dL — ABNORMAL HIGH (ref 0.61–1.24)
GFR, Estimated: 13 mL/min — ABNORMAL LOW
Glucose, Bld: 101 mg/dL — ABNORMAL HIGH (ref 70–99)
Potassium: 3.5 mmol/L (ref 3.5–5.1)
Sodium: 135 mmol/L (ref 135–145)
Total Bilirubin: 1.2 mg/dL (ref 0.3–1.2)
Total Protein: 5.2 g/dL — ABNORMAL LOW (ref 6.5–8.1)

## 2020-09-06 LAB — CULTURE, BLOOD (ROUTINE X 2): Special Requests: ADEQUATE

## 2020-09-06 LAB — CBC
HCT: 29.8 % — ABNORMAL LOW (ref 39.0–52.0)
Hemoglobin: 9.8 g/dL — ABNORMAL LOW (ref 13.0–17.0)
MCH: 29.9 pg (ref 26.0–34.0)
MCHC: 32.9 g/dL (ref 30.0–36.0)
MCV: 90.9 fL (ref 80.0–100.0)
Platelets: 177 10*3/uL (ref 150–400)
RBC: 3.28 MIL/uL — ABNORMAL LOW (ref 4.22–5.81)
RDW: 15 % (ref 11.5–15.5)
WBC: 15.7 10*3/uL — ABNORMAL HIGH (ref 4.0–10.5)
nRBC: 0 % (ref 0.0–0.2)

## 2020-09-06 MED ORDER — SODIUM CHLORIDE 0.9 % IV SOLN
2.0000 g | INTRAVENOUS | Status: DC
  Administered 2020-09-06: 2 g via INTRAVENOUS
  Filled 2020-09-06: qty 2

## 2020-09-06 MED ORDER — VANCOMYCIN HCL 500 MG/100ML IV SOLN
500.0000 mg | INTRAVENOUS | Status: DC
  Administered 2020-09-06: 500 mg via INTRAVENOUS
  Filled 2020-09-06 (×2): qty 100

## 2020-09-06 MED ORDER — METRONIDAZOLE 500 MG/100ML IV SOLN
500.0000 mg | Freq: Three times a day (TID) | INTRAVENOUS | Status: DC
  Administered 2020-09-06 – 2020-09-07 (×3): 500 mg via INTRAVENOUS
  Filled 2020-09-06 (×3): qty 100

## 2020-09-06 MED ORDER — MAGNESIUM SULFATE IN D5W 1-5 GM/100ML-% IV SOLN
1.0000 g | Freq: Once | INTRAVENOUS | Status: AC
  Administered 2020-09-06: 1 g via INTRAVENOUS
  Filled 2020-09-06: qty 100

## 2020-09-06 MED ORDER — MELATONIN 3 MG PO TABS
3.0000 mg | ORAL_TABLET | Freq: Every day | ORAL | Status: DC
  Administered 2020-09-06 – 2020-09-10 (×5): 3 mg via ORAL
  Filled 2020-09-06 (×5): qty 1

## 2020-09-06 NOTE — Progress Notes (Signed)
Daily Progress Note   Patient Name: Luke Kaufman       Date: 09/06/2020 DOB: 1950-08-29  Age: 70 y.o. MRN#: [id number] Attending Physician: McDiarmid, Blane Ohara, MD Primary Care Physician: Rosaria Ferries, MD Admit Date: 09/05/2020  Reason for Consultation/Follow-up: goals of care  Subjective: Patient states he is doing "ok" and feels better than yesterday. Denies pain or shortness of breath. His breathing appears less labored than yesterday. His lunch tray is still on the bedside table - note that it is untouched. He does endorse having a poor appetite.   Luke Kaufman shares that he has decided to give his body a chance to improve and "heal". He again expresses that he is not afraid to die when it's his time, but for now wants to get better. He wants to continue IV antibiotics, Eliquis, and daily labs as previously discussed with Dr. Caron Presume.  RN is at bedside and expresses concern that he has only one peripheral IV that had to be placed by vascular access team, and is ordered to receive 3 antibiotics, including vancomycin.   Length of Stay: 0  Current Medications: Scheduled Meds:  . apixaban  2.5 mg Oral BID  . sertraline  50 mg Oral Daily  . sodium bicarbonate  1,300 mg Oral TID    Continuous Infusions: . ceFEPime (MAXIPIME) IV    . magnesium sulfate bolus IVPB    . metronidazole    . vancomycin 500 mg (09/06/20 1602)    PRN Meds: acetaminophen **OR** acetaminophen, ipratropium-albuterol, nicotine, polyethylene glycol  Physical Exam Vitals reviewed.  Constitutional:      General: He is not in acute distress.    Appearance: He is ill-appearing.  Pulmonary:     Effort: Pulmonary effort is normal.  Musculoskeletal:     Comments: Left upper extremity swelling   Neurological:     Mental Status: He is alert and oriented to person, place, and time.     Motor: Weakness present.             Vital Signs: BP (!) 123/92 (BP Location: Right Leg)   Pulse 91   Temp 98.2 F (36.8 C) (Oral)   Resp 15   Ht 5\' 8"  (1.727 m)   Wt 98 kg   SpO2 96%   BMI 32.85 kg/m  SpO2: SpO2:  96 % O2 Device: O2 Device: Room Air O2 Flow Rate: O2 Flow Rate (L/min): 3 L/min  Intake/output summary:   Intake/Output Summary (Last 24 hours) at 09/06/2020 1609 Last data filed at 09/06/2020 0800 Gross per 24 hour  Intake 580 ml  Output 650 ml  Net -70 ml   LBM: Last BM Date: 09/05/20 Baseline Weight: Weight: 98 kg Most recent weight: Weight: 98 kg       Palliative Assessment/Data: PPS 30%       Palliative Care Assessment & Plan   HPI/Patient Profile: 70 y.o. 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 He presented to the emergency department on 09/05/2020 with shortness of breath. He was just discharged back to SNF after being hospitalized 5/11--5/17 with acute on chronic renal failure, acute cholecystitis, sepsis, and aspiration pneumonia.   Of note, patient is followed by [facility] services.   Assessment:  - sepsis, possible bacteremia - acute cholecystitis - pulmonary edema - aspiration pneumonia - CKD stage V - left upper extremity swelling  Recommendations/Plan:  DNR/DNI as previously documented  Continue current medical care - oxygen, antibiotics, labs  No bipap or hemodialysis  Goal of care is to improve/recover from his acute illness, then return to SNF with hospice  Would consider PICC placement - poor PIV access, multiple antibiotics, vancomycin  PMT will continue to follow  Goals  of Care and Additional Recommendations:  Limitations on Scope of Treatment: no hemodialysis, no surgical procedures  Prognosis:  Poor overall, less than 6 months  Discharge Planning:  SNF with hospice    Thank you for allowing the Palliative Medicine Team to assist in the care of this patient.   Total Time 25 minutes Prolonged Time Billed  no       Greater than 50%  of this time was spent counseling and coordinating care related to the above assessment and plan.  Lavena Bullion, NP  Please contact Palliative Medicine Team phone at [phone] for questions and concerns.

## 2020-09-06 NOTE — Progress Notes (Signed)
Manufacturing engineer (ACC)  Met with Mr. Thayne at the bedside who confirms that he wants to medically optimize while here at [facility] but wants to have hospice support after discharge.  TOC Heather made aware. Pt may benefit from a PMT consult to formalize a MOST.  ACC will continue to follow and support/help plan for dc.  Thank you for the opportunity to participate in this pt's care.  Domenic Moras, BSN, RN [facility] Liaison [phone] (24h on call)

## 2020-09-06 NOTE — Progress Notes (Signed)
Pharmacy Antibiotic Note  Luke Kaufman is a 70 y.o. male admitted on 09/05/2020 with sepsis.  Pharmacy has been consulted to resume vancomycin and cefepime dosing.Patient completed 5 days IV Abx 5-12 to 5/16.  Random vanc level 17 mcg/ml on 5/16 @0100 . Patient initially decided to transition to comfort care, thus IV abx were switched to PO Augmentin on 5/17 with plans to continue through 5/29 per surgery. Today, patient no longer wishes comfort care and desires prior abx treatment.   Plan: Metronidazole 500mg  IV q8h per MD  Cefepime 2gm IV q24h  Vancomyin 500 mg IV q24h  - Monitor renal function, clinical improvement, and de-escalation    Height: 5\' 8"  (172.7 cm) Weight: 98 kg (216 lb 0.8 oz) IBW/kg (Calculated) : 68.4  Temp (24hrs), Avg:98.1 F (36.7 C), Min:97.6 F (36.4 C), Max:98.5 F (36.9 C)  Recent Labs  Lab 08/30/20 1249 08/30/20 2207 08/31/20 1347 09/01/20 0518 09/01/20 0844 09/01/20 1827 09/02/20 0117 09/03/20 0243 09/04/20 0100 09/05/20 0606  WBC  --    < > 4.1 8.4  --   --  10.7* 14.5* 15.5* 17.9*  CREATININE  --    < >  --   --    < > 6.76* 6.54* 6.01* 5.45* 5.02*  LATICACIDVEN 0.7  --  1.6  --   --   --   --   --   --   --   VANCORANDOM  --   --   --   --   --   --  20  --  17  --    < > = values in this interval not displayed.    Estimated Creatinine Clearance: 15.5 mL/min (A) (by C-G formula based on SCr of 5.02 mg/dL (H)).    No Known Allergies  Antimicrobials: Cefepime 5/12 >> 5/16; 5/18 >>  Vanc 5/12 >> 5/16;  5/18 >>  Metronidazole 5/13>> 5/16;  5/18 >>  Augmentin 5/16 >> 5/18     Thank you for allowing pharmacy to be a part of this patient's care.  Claudina Lick, PharmD PGY1 Acute Care Pharmacy Resident 09/06/2020 11:14 AM  Please check [email or web address] for unit-specific pharmacy phone numbers.

## 2020-09-06 NOTE — Progress Notes (Signed)
   09/05/20 1230  Assess: MEWS Score  Level of Consciousness Alert  Assess: MEWS Score  MEWS Temp 0  MEWS Systolic 0  MEWS Pulse 2  MEWS RR 2  MEWS LOC 0  MEWS Score 4  MEWS Score Color Red  Assess: if the MEWS score is Yellow or Red  Were vital signs taken at a resting state? No  Focused Assessment Change from prior assessment (see assessment flowsheet)  Early Detection of Sepsis Score *See Row Information* High  MEWS guidelines implemented *See Row Information* No, vital signs rechecked  Treat  MEWS Interventions Other (Comment) (rechecked VS after patient in room and rested a few minutes)  Pain Scale 0-10  Pain Score 0  Document  Patient Outcome Stabilized after interventions  Progress note created (see row info) Yes    Patient a red mews after VS taken when patient first arrived to room. Patient returned to green MEWS within one hour of arrival and VS rechecked. Stable at this point.

## 2020-09-06 NOTE — Progress Notes (Addendum)
Family Medicine Teaching Service Daily Progress Note Intern Pager: [phone]  Patient name: Luke Kaufman Medical record number: [id number] Date of birth: November 01, 1950 Age: 70 y.o. Gender: male  Primary Care Provider: Rosaria Ferries, MD Consultants: Palliative Code Status: DNR/DNI  Pt Overview and Major Events to Date:  5/17- admitted  Assessment and Plan: Luke Kaufman is 70 yo male that admitted for respiratory distress after recent discharge on 5/16. 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   SepsisBactermia? 2/2 Acute Cholecystis Diagnosed by HIDA on recent admission. Surgery consulted at that time and plan was to finish 14-day course of abx. Received 6 days of Metronidazole (5-13-), Cefepime (5/12-), and Vancomycin (5/12-) before discharge on 5/16, missing 2 doses of Metronidazole.  Patient is not a surgical candidate nor does he desire surgery. We will continue IV antibiotics, and monitor labs and physical exam. If labs or symptoms worsen will consider reaching back out to Surgery for additional recommendations. --Start IV Metronidazole  --Start IV Cefepime --Start IV Vancomycin --CBC --CMP  Pulmonary Edema likely 2/2 Aspiration PNA Patient presented with respiratory distress. Reports breathing comfortably on 4LNC. Received treatment with IV antibiotics and PO Augementin. We will continue IV antibiotics during this hospitalization. --see above  Goals of Care Palliative Care consulted during most recent admission and clarified goals of care. Goals of care also discussed further with inpatient team. At this current time patient states that he wants to continue with optimal management, including IV antibiotics, lab draws... He still declines HD, and remains DNR. We re-consulted Palliative to discuss GOC. --f/u Palliative Consult  L.Upper  Extremity Swelling LUE noted to have increase swelling over the last 2 days. Patient reports chronic weakness in LUE, but normally no swelling. Asked nurse to keep elevated and we will continue to monitor.  Consider U/S if worsens or no improvement over the next 24-48hrs.  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  Creatinine on discharge 5.45 (baseline appears to be 2-3).  Patient was started on sodium bicarb last admission.  He does not desire starting hemodialysis. -Continue sodium bicarb 1300 mg TID -BMP, Mg, Phos daily -Strict I/O -Daily weights  Hypotension, resolved BP is 151/97 this morning. Pressures have been raning from 110s-150s/40s-90s over the last 24hrs. We will continue to monitor and consider adding Metoprolol if pressures continue to increase.  -Monitor BP -Hold Metoprolol  Paroxysmal Atrial Fibrillation CHA2DS2-VASc score of 5. Cardiology consulted on prior admission and patient was started on Metoprolol 12.5 mg BID for rate control. Discharged on Eliquis. Cardiology recommended amiodarone if recurrent afib with RVR or to up-titrate beta blocker. During my encounter HR irregularly irregular in 90's and low 100's. He is asymptomatic. Given his hypotension, will hold metoprolol at this time.  -Continue Eliquis -Hold Metoprolol   HLD: Chronic, stable Atorvastatin was recently discontinued at discharge due to elevated CK.  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 Resides in a nursing home. Without new neurological deficit.   Hx DVT and Pulmonary Embolism In 2019,discovered s/p cardiac arrest.On Eliquis2.5 mg BID. -Eliquis 2.5 BID  SMA occlusion s/pright hemicolectomy and ileostomy in 08/2017 Had repeated surgeries with failed attempts at closure of abdomen. -Wound care; ostomy care and dressing changes  Hx V. Fib Cardiac arrest  In 2020 after emergent  surgery  for SMA obstruction and mesenteric ischemia. Patient had post-surgical respiratory failure requiring tracheostomy and v-fib cardiac arrest.Following this had afib and was placed on Eliquis.  Depression: Chronic, stable -Continue Sertraline 50 mg daily    BPH On Tamsulosin.  -Holding Tamsulosin given soft BP's   FEN/GI: Regular  PPx:    Status is: Observation  The patient will require care spanning > 2 midnights and should be moved to inpatient because: Inpatient level of care appropriate due to severity of illness  Dispo: The patient is from: SNF              Anticipated d/c is to: SNF              Patient currently is not medically stable to d/c.   Difficult to place patient No  Subjective:  Luke Kaufman reports that he is feeling better than he was over the last 3 days. After giving some time and thought he would like to continue with antibiotics and "improving his health". Dr. Thompson Grayer and Dr. Caron Presume at bedside and expressed continuing with patient's wishes and keeping him updated with status/progress. Patient also reports that some pain   Objective: Temp:  [97.6 F (36.4 C)-98.5 F (36.9 C)] 98.2 F (36.8 C) (05/18 0612) Pulse Rate:  [53-110] 89 (05/18 0612) Resp:  [15-27] 17 (05/18 0612) BP: (99-157)/(42-104) 151/97 (05/18 0612) SpO2:  [90 %-95 %] 92 % (05/18 0612) Physical Exam: General: Laying in bed comfortably. Cardiovascular: RRR, no murmurs Respiratory: Breathing on 4LNC. Normal WOB. Course breath sounds bilaterally Extremities: Pitting edema in Left Upper extremity, warm, shiny and mild erythematous. Mild tenderness with palpation, with reduced flexion of fingers.   Laboratory: Recent Labs  Lab 09/03/20 0243 09/04/20 0100 09/05/20 0606  WBC 14.5* 15.5* 17.9*  HGB 9.0* 9.2* 9.7*  HCT 27.2* 27.8* 29.9*  PLT 121* 131* 155   Recent Labs  Lab 09/03/20 0243 09/04/20 0100 09/05/20 0606  NA 134* 134* 135  K 4.2 4.0 4.3  CL 103 105 105  CO2  17* 17* 20*  BUN 94* 94* 100*  CREATININE 6.01* 5.45* 5.02*  CALCIUM 7.8* 7.9* 8.0*  PROT 5.2* 5.0* 5.1*  BILITOT 0.9 1.1 1.6*  ALKPHOS 172* 192* 207*  ALT 48* 57* 70*  AST 92* 114* 154*  GLUCOSE 105* 97 105*    Imaging/Diagnostic Tests:   Jacklynn Bue, Medical Student 09/06/2020, 7:20 AM MS4, [place] Intern pager: [phone], text pages welcome  Resident Attestation  I saw and evaluated the patient, performing the key elements of the service.I  personally performed or re-performed the history, physical exam, and medical decision making activities of this service and have verified that the service and findings are accurately documented in the student's note. I developed the management plan that is described in the medical student's note, and I agree with the content, with my edits above.    Gifford Shave, PGY2

## 2020-09-07 ENCOUNTER — Inpatient Hospital Stay (HOSPITAL_COMMUNITY): Payer: Medicare Other

## 2020-09-07 DIAGNOSIS — N179 Acute kidney failure, unspecified: Secondary | ICD-10-CM

## 2020-09-07 DIAGNOSIS — N185 Chronic kidney disease, stage 5: Secondary | ICD-10-CM

## 2020-09-07 LAB — CBC WITH DIFFERENTIAL/PLATELET
Abs Immature Granulocytes: 0.36 10*3/uL — ABNORMAL HIGH (ref 0.00–0.07)
Basophils Absolute: 0 10*3/uL (ref 0.0–0.1)
Basophils Relative: 0 %
Eosinophils Absolute: 0.1 10*3/uL (ref 0.0–0.5)
Eosinophils Relative: 1 %
HCT: 29.9 % — ABNORMAL LOW (ref 39.0–52.0)
Hemoglobin: 9.7 g/dL — ABNORMAL LOW (ref 13.0–17.0)
Immature Granulocytes: 2 %
Lymphocytes Relative: 3 %
Lymphs Abs: 0.5 10*3/uL — ABNORMAL LOW (ref 0.7–4.0)
MCH: 29.8 pg (ref 26.0–34.0)
MCHC: 32.4 g/dL (ref 30.0–36.0)
MCV: 92 fL (ref 80.0–100.0)
Monocytes Absolute: 0.6 10*3/uL (ref 0.1–1.0)
Monocytes Relative: 4 %
Neutro Abs: 14.1 10*3/uL — ABNORMAL HIGH (ref 1.7–7.7)
Neutrophils Relative %: 90 %
Platelets: 185 10*3/uL (ref 150–400)
RBC: 3.25 MIL/uL — ABNORMAL LOW (ref 4.22–5.81)
RDW: 15.2 % (ref 11.5–15.5)
WBC: 15.6 10*3/uL — ABNORMAL HIGH (ref 4.0–10.5)
nRBC: 0 % (ref 0.0–0.2)

## 2020-09-07 LAB — COMPREHENSIVE METABOLIC PANEL
ALT: 61 U/L — ABNORMAL HIGH (ref 0–44)
AST: 89 U/L — ABNORMAL HIGH (ref 15–41)
Albumin: 1.5 g/dL — ABNORMAL LOW (ref 3.5–5.0)
Alkaline Phosphatase: 297 U/L — ABNORMAL HIGH (ref 38–126)
Anion gap: 13 (ref 5–15)
BUN: 97 mg/dL — ABNORMAL HIGH (ref 8–23)
CO2: 19 mmol/L — ABNORMAL LOW (ref 22–32)
Calcium: 8.3 mg/dL — ABNORMAL LOW (ref 8.9–10.3)
Chloride: 103 mmol/L (ref 98–111)
Creatinine, Ser: 4.05 mg/dL — ABNORMAL HIGH (ref 0.61–1.24)
GFR, Estimated: 15 mL/min — ABNORMAL LOW (ref >60)
Glucose, Bld: 98 mg/dL (ref 70–99)
Potassium: 3.3 mmol/L — ABNORMAL LOW (ref 3.5–5.1)
Sodium: 135 mmol/L (ref 135–145)
Total Bilirubin: 1.2 mg/dL (ref 0.3–1.2)
Total Protein: 5.2 g/dL — ABNORMAL LOW (ref 6.5–8.1)

## 2020-09-07 LAB — MAGNESIUM: Magnesium: 1.9 mg/dL (ref 1.7–2.4)

## 2020-09-07 MED ORDER — FUROSEMIDE 10 MG/ML IJ SOLN
40.0000 mg | Freq: Once | INTRAMUSCULAR | Status: AC
  Administered 2020-09-07: 40 mg via INTRAVENOUS
  Filled 2020-09-07: qty 4

## 2020-09-07 MED ORDER — AMOXICILLIN-POT CLAVULANATE 500-125 MG PO TABS
1.0000 | ORAL_TABLET | Freq: Two times a day (BID) | ORAL | Status: DC
  Administered 2020-09-07 – 2020-09-11 (×9): 500 mg via ORAL
  Filled 2020-09-07 (×10): qty 1

## 2020-09-07 MED ORDER — POTASSIUM CHLORIDE CRYS ER 20 MEQ PO TBCR
40.0000 meq | EXTENDED_RELEASE_TABLET | Freq: Once | ORAL | Status: AC
  Administered 2020-09-07: 40 meq via ORAL
  Filled 2020-09-07: qty 2

## 2020-09-07 MED ORDER — MAGNESIUM SULFATE IN D5W 1-5 GM/100ML-% IV SOLN
1.0000 g | Freq: Once | INTRAVENOUS | Status: AC
  Administered 2020-09-07: 1 g via INTRAVENOUS
  Filled 2020-09-07: qty 100

## 2020-09-07 NOTE — Progress Notes (Signed)
Family Medicine Teaching Service Daily Progress Note Intern Pager: [phone]  Patient name: Luke Kaufman Medical record number: [id number] Date of birth: 05-Mar-1951 Age: 70 y.o. Gender: male  Primary Care Provider: Rosaria Ferries, MD Consultants: Palliative Care Code Status: DNR  Pt Overview and Major Events to Date:  5/13  Assessment and Plan: Luke Kaufman is 70 yo male that admitted for respiratory distress after recent discharge on 5/16. 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  Sepsis 2/2 Acute Cholecystis, improving Diagnosed by HIDA on recent admission. Surgery consulted at that time and plan was to finish14-day course of abx. Received 6 days of Metronidazole (5-13-), Cefepime (5/12-), and Vancomycin (5/12-).  Antibiotics were restarted yesterday. Patient is not a surgical candidate nor does he desire surgery. Patient remains asymptomatic.. WBC count is 15.6, stable from 15.7 at previous discharge. We will de-escalate antibiotics to PO Augmentin 500mg  BID with improved renal function, and monitor labs and physical exam.   --discontinue IV Metronidazole --discontinue  IV Cefepime --discontinue Vancomycin --start PO Augmentin 500 BID  --CBC --CMP  Pulmonary Edemalikely 2/2 Aspiration PNA, resolved Patient presented with respiratory distress. Physical exam is unchanged.Reports breathing comfortably on 4LNC. He has received 7 days of IV Cefepime and Vancomycin, so PNA is most likely resolved. We will stop IV antibiotics and order a repeat CXR to re-evaluate. --CXR 2 view --see above  L.Upper Extremity Swelling, ongoing LUE noted to have increase swelling over the last 3 days. Patient reports chronic weakness in LUE, but normally no swelling.  Patient has been on anticoagulation with Eliquis so this is less likely, but we will order a  LUE Ultrasound with doppler to rule out  DVT. --U/S with doppler of LUE   Goals of Care, ongoing Palliative Care saw Luke Kaufman again yesterday and he re-iterated GOC/ wishes to continue with treatment of acute illness, then return to SNF. We appreciated Palliative Medicine Team for continuing to follow.    CKD Stage V, not on HD, improving Scr is 4.05, discharged with 5.45. Mg is 1.9. Renal function appears to be continually improving. Currently taking NaHCO3 1300mg . Patient does not desire HD.  -Continue sodium bicarb 1300 mg TID -BMP, Mg, Phos daily   Hypotension, resolved BP is 105/82  this morning. We will continue to monitor and continue holding metoprolol given low systolic pressures. --Monitor BP --Hold Metoprolol  ParoxysmalAtrial Fibrillation CHA2DS2-VASc score of 5. HR has been stable in 70-90s. We were holding Metoprolol given previous hypotension. Currently taking Eliquis 2.5mg  BID. He is asymptomatic.   --Continue Eliquis --Hold Metoprolol 12.5mg  BID  HLD: Chronic, stable Atorvastatin was recently discontinued at discharge due to elevated CK.    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 Resides in a nursing home. Without new neurological deficit.   Hx DVT and Pulmonary Embolism In 2019,discovered s/p cardiac arrest.On Eliquis2.5 mg BID. -continue Eliquis 2.5 BID  SMA occlusion s/pright hemicolectomy and ileostomy in 08/2017 Had repeated surgeries with failed attempts at closure of abdomen. -Wound care; ostomy care and dressing changes  Hx V. Fib Cardiac arrest  In 2020 after emergent surgery for SMA obstruction and mesenteric ischemia. Patient had post-surgical respiratory failure requiring tracheostomy and v-fib cardiac arrest.Following this had afib and was placed on Eliquis.  Depression: Chronic, stable -Continue Sertraline 50 mg daily   BPH Home Tamsulosin was held at  admission given hypotension.   --Holding Tamsulosin given soft BP's  FEN/GI: Regular  PPx:    Status is: Inpatient  Remains inpatient appropriate because:IV treatments appropriate due to intensity of illness or inability to take PO   Dispo: The patient is from: SNF              Anticipated d/c is to: SNF              Patient currently is not medically stable to d/c.   Difficult to place patient No        Subjective:  Luke Kaufman reports that he is feeling "okay" this morning and has not noticed a change in his symptoms. Eating breakfast during our encounter and denies any N/V or abdominal pain. He reports that his breathing is the same. In regards to his left arm swelling he reports no pain, but states "it's a little uncomfortable".  No additional concerns or complaints this morning.   Objective: Temp:  [97.6 F (36.4 C)-98.3 F (36.8 C)] 97.6 F (36.4 C) (05/19 0539) Pulse Rate:  [60-91] 85 (05/19 0539) Resp:  [15-17] 17 (05/19 0539) BP: (105-127)/(82-97) 105/82 (05/19 0539) SpO2:  [96 %-97 %] 97 % (05/19 0539) Physical Exam: General: Sitting in bed, eating breakfast and watching TV. Cardiovascular: Regular rate and rhythm. No mumurs heard. Respiratory: Breathing on 4LNC. Normal WOB. Course breath sounds bilaterally Abdomen: Normoactive bowel sounds, mildly distended on left side. Mild tenderness to palpation of LUQ.  Extremities: LUE pitting edema. Non-tender to palpation, unchanged from yesterday's exam.   Laboratory: Recent Labs  Lab 09/05/20 0606 09/06/20 1050 09/07/20 0306  WBC 17.9* 15.7* 15.6*  HGB 9.7* 9.8* 9.7*  HCT 29.9* 29.8* 29.9*  PLT 155 177 185   Recent Labs  Lab 09/05/20 0606 09/06/20 1050 09/07/20 0306  NA 135 135 135  K 4.3 3.5 3.3*  CL 105 103 103  CO2 20* 15* 19*  BUN 100* 101* 97*  CREATININE 5.02* 4.45* 4.05*  CALCIUM 8.0* 8.2* 8.3*  PROT 5.1* 5.2* 5.2*  BILITOT 1.6* 1.2 1.2  ALKPHOS 207* 250* 297*  ALT 70* 67* 61*  AST 154* 110* 89*  GLUCOSE 105* 101*  98    Imaging/Diagnostic Tests:   Jacklynn Bue, Medical Student 09/07/2020, 9:08 AM MS4, [place] Intern pager: [phone], text pages welcome

## 2020-09-07 NOTE — Progress Notes (Signed)
Daily Progress Note   Patient Name: Luke Kaufman       Date: 09/07/2020 DOB: [birth year]  Age: 70 y.o. MRN#: [id number] Attending Physician: Martyn Malay, MD Primary Care Physician: Rosaria Ferries, MD Admit Date: 09/05/2020  Reason for Consultation/Follow-up: Establishing goals of care  Subjective: Feels okay, better than before. Feels as though he is breathing easier though he does appears slightly short of breath - he denies.   Length of Stay: 1  Current Medications: Scheduled Meds:  . amoxicillin-clavulanate  1 tablet Oral q12n4p  . apixaban  2.5 mg Oral BID  . melatonin  3 mg Oral QHS  . sertraline  50 mg Oral Daily  . sodium bicarbonate  1,300 mg Oral TID    Continuous Infusions:   PRN Meds: acetaminophen **OR** acetaminophen, ipratropium-albuterol, nicotine, polyethylene glycol  Physical Exam Constitutional:      General: He is not in acute distress. Pulmonary:     Comments: Appears slightly short of breath Skin:    General: Skin is warm and dry.  Neurological:     Mental Status: He is alert and oriented to person, place, and time.  Psychiatric:     Comments: Slightly withdrawn             Vital Signs: BP 105/82 (BP Location: Right Leg)   Pulse 85   Temp 97.6 F (36.4 C) (Oral)   Resp 17   Ht 5\' 8"  (1.727 m)   Wt 98 kg   SpO2 97%   BMI 32.85 kg/m  SpO2: SpO2: 97 % O2 Device: O2 Device: Nasal Cannula O2 Flow Rate: O2 Flow Rate (L/min): 4 L/min  Intake/output summary:   Intake/Output Summary (Last 24 hours) at 09/07/2020 1232 Last data filed at 09/07/2020 0900 Gross per 24 hour  Intake 340 ml  Output 1350 ml  Net -1010 ml   LBM: Last BM Date: 09/05/20 (ostomy bag) Baseline Weight: Weight: 98 kg Most recent weight: Weight: 98 kg        Palliative Assessment/Data: PPS 30%      Patient Active Problem List   Diagnosis Date Noted  . Pulmonary edema 09/05/2020  . Aspiration pneumonia ([place]) 09/05/2020  . Acute cholecystitis 09/05/2020  . Leukocytosis 09/05/2020  . Edema of left upper arm 09/05/2020  . Possible mucus plugging of bronchi 09/05/2020  . Dyspnea   . Acute on chronic kidney failure ([place]) 08/31/2020  . Transaminitis   . Non-intractable vomiting   . Metabolic acidosis 70/04/7492  . Acute kidney injury (AKI) with acute tubular necrosis (ATN) ([place]) 11/11/2018  . Upper airway cough syndrome 03/31/2018  . DOE (dyspnea on exertion) 03/30/2018  . 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 Acute renal failure ([place]) 10/14/2017  . History of pulmonary embolism 10/14/2017  . Cardiac arrest ([place]) 10/14/2017  . Non-STEMI (non-ST elevated myocardial infarction) ([place])   . Pulmonary embolus, left ([place])   . Left hemiparesis ([place])   . Severe protein-calorie malnutrition ([place])   . Elevated troponin   . Acute on chronic respiratory failure with hypoxia ([place])   . Cardiac arrest ([place])   . AKI (acute kidney injury) ([place])   . Acute respiratory failure with hypoxemia ([place])   .  Respiratory failure ([place])   . PUD (peptic ulcer disease) 09/04/2017  . Mesenteric ischemia ([place]) 09/04/2017  . 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 Left arm numbness 12/14/2015  . Numbness 12/14/2015  . Bilateral carotid artery stenosis 11/22/2015  . 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 Mitral valve prolapse 09/07/2015  . Vitamin D deficiency 08/31/2015  . Arterial atherosclerosis 04/10/2015  . Cataract 04/10/2015  . Cerebrovascular accident, old 04/10/2015  . Gonalgia 04/10/2015  . HLD (hyperlipidemia) 04/10/2015  . 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 Renal artery stenosis ([place]) 04/10/2015  . History of non-ST elevation myocardial infarction (NSTEMI)  04/10/2015  . Anemia associated with acute blood loss 08/24/2014  . Thrombocytopenia ([place])   . Hypokalemia   . Essential hypertension 08/09/2014  . Chest pain 08/09/2014  . Near syncope 08/09/2014  . CKD (chronic kidney disease) stage 3, GFR 30-59 ml/min (HCC) 08/09/2014  . Tobacco abuse 08/09/2014  . Upper GI bleeding   . Gastric ulcer with hemorrhage   . Cerebral infarction ([place]) 05/16/2013  . HTN (hypertension) 05/16/2013  . 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 He presented to the emergency departmenton5/17/2022with shortness of breath.He was just discharged back to SNF after being hospitalized 5/11--5/17 with acute on chronic renal failure, acute cholecystitis, sepsis, and aspiration pneumonia.  Of note, patient is followed by [facility] services.   Assessment: Patient reports feeling better. He tells me he wants to continue current treatment then return to his LTC facility. We discussed involvement of hospice and he is agreeable to this.  However, as we started discussed MOST form his goals were not in line with hospice care.   I completed a MOST form today. The patient and family outlined their wishes for the following treatment decisions:  Cardiopulmonary Resuscitation: Do Not Attempt Resuscitation (DNR/No CPR)  Medical Interventions: Limited Additional Interventions: Use medical treatment, IV fluids and cardiac monitoring as indicated, DO NOT USE intubation or mechanical ventilation. May consider use of less invasive airway support such as BiPAP or CPAP. Also provide comfort measures. Transfer to the hospital if  indicated. Avoid intensive care.   Antibiotics: Antibiotics if indicated  IV Fluids: IV fluids if indicated  Feeding Tube: Feeding tube for a defined trial period   He shares he would want to come back to the hospital if needed - tells me "I want them to do what they can to keep me alive". He also shares he would be open to a feeding tube if needed.  I detailed for him an aggressive care path and a comfort care path and asked which one was more in line with his goals of care and he tells me the aggressive path is - including returning to the hospital and continuing aggressive medical interventions that would prolong life. We discuss that this is not really in line with hospice support - as hospice allows the body to progress through the natural disease course and provide supportive care to promote comfort and quality of life. He expresses understanding.   We discuss that we expect that he will return to the hospital because of his advanced chronic diseases. We discuss that  at some point his wishes documented on MOST form may change, he may no longer want to return to hospital and at that point he can receive extra support through hospice at his facility.   We discuss that in an earlier conversation with PMT he expressed that his most important goal was dying at his LTC facility. I ask him if that is still true and he tells me "that would be nice" but is not necessarily most important goal to him.    I notified hospice of patient's wishes, will shift to palliative referral outpatient.  Recommendations/Plan:  Goals have shifted for patient - hospice support no longer appropriate, will ask palliative to see at SNF upon discharge  MOST completed - DNR, would want to return to hospital if needed, antibiotics and fluids okay, feeding tube for trial  Continue current treatment plan  Code Status:  DNR  Prognosis:   Unable to determine  Discharge Planning:  [facility] for rehab  with Palliative care service follow-up  Care plan was discussed with patient, hospice liaison  Thank you for allowing the Palliative Medicine Team to assist in the care of this patient.   Total Time 40 minutes Prolonged Time Billed  no       Greater than 50%  of this time was spent counseling and coordinating care related to the above assessment and plan.  Juel Burrow, DNP, [facility] Palliative Medicine Team Team Phone # [phone]  Pager [phone]

## 2020-09-07 NOTE — Plan of Care (Signed)

## 2020-09-07 NOTE — Evaluation (Signed)
Physical Therapy Evaluation Patient Details Name: Luke Kaufman MRN: [id number] DOB: 01/24/1951 Today's Date: 09/07/2020   History of Present Illness  Pt is a 70 y/o male admitted 5/17 from SNF secondary to increased DOE and respiratory distress. Thought to be secondary to pulmonary edema from aspiration PNA. Pt with recent admission for SOB. PMH includes CAD, PVD, CVA, HTN, tobacco use, PE, colostomy and chronic abdominal wound.  Clinical Impression  Pt admitted secondary to problem above with deficits below. Noted mild SOB throughout this session. Pt only agreeable to bed level assessment this session. REquired total A +2 for repositioning. Required assist to perform BLE HEP this session as well. Recommend return to SNF at d/c. Will continue to follow acutely to maximize functional mobility independence and safety.     Follow Up Recommendations SNF;Supervision/Assistance - 24 hour    Equipment Recommendations  None recommended by PT    Recommendations for Other Services       Precautions / Restrictions Precautions Precautions: Fall;Other (comment) Precaution Comments: non-healing abdominal wound, colostomy Restrictions Weight Bearing Restrictions: No      Mobility  Bed Mobility Overal bed mobility: Needs Assistance             General bed mobility comments: total A +2  for repositioning in bed. Pt did not want to perform further mobility. Had both arms propped on pillows at end of session.    Transfers                    Ambulation/Gait                Stairs            Wheelchair Mobility    Modified Rankin (Stroke Patients Only)       Balance                                             Pertinent Vitals/Pain Pain Assessment: Faces Faces Pain Scale: Hurts a little bit Pain Location: generalized Pain Descriptors / Indicators: Grimacing;Guarding Pain Intervention(s): Monitored during session;Limited activity within  patient's tolerance;Repositioned    Home Living Family/patient expects to be discharged to:: Skilled nursing facility                      Prior Function Level of Independence: Needs assistance   Gait / Transfers Assistance Needed: Reports he is normally able to transfer to his WC by himself. Since last hospital admission, has required help.  ADL's / Homemaking Assistance Needed: Staff assists with ADL tasks.        Hand Dominance        Extremity/Trunk Assessment   Upper Extremity Assessment Upper Extremity Assessment: Generalized weakness (weakness in LUE at baseline)    Lower Extremity Assessment Lower Extremity Assessment: Generalized weakness       Communication   Communication: HOH  Cognition Arousal/Alertness: Awake/alert Behavior During Therapy: Flat affect Overall Cognitive Status: No family/caregiver present to determine baseline cognitive functioning                                 General Comments: Flat affect throughout. Initially agreeable to mobility, however, when it came to moving, did not want to get out of bed.      General Comments  Exercises General Exercises - Lower Extremity Ankle Circles/Pumps: AAROM;Both;10 reps Heel Slides: AAROM;Both;10 reps;Supine   Assessment/Plan    PT Assessment Patient needs continued PT services  PT Problem List Decreased strength;Decreased cognition;Decreased activity tolerance;Decreased safety awareness;Decreased balance;Decreased mobility;Cardiopulmonary status limiting activity;Decreased coordination       PT Treatment Interventions DME instruction;Balance training;Gait training;Cognitive remediation;Functional mobility training;Patient/family education;Therapeutic activities;Therapeutic exercise;Wheelchair mobility training    PT Goals (Current goals can be found in the Care Plan section)  Acute Rehab PT Goals Patient Stated Goal: go back to [facility] PT Goal  Formulation: With patient Time For Goal Achievement: 09/21/20 Potential to Achieve Goals: Fair    Frequency Min 2X/week   Barriers to discharge        Co-evaluation               AM-PAC PT "6 Clicks" Mobility  Outcome Measure Help needed turning from your back to your side while in a flat bed without using bedrails?: A Lot Help needed moving from lying on your back to sitting on the side of a flat bed without using bedrails?: Total Help needed moving to and from a bed to a chair (including a wheelchair)?: Total Help needed standing up from a chair using your arms (e.g., wheelchair or bedside chair)?: Total Help needed to walk in hospital room?: Total Help needed climbing 3-5 steps with a railing? : Total 6 Click Score: 7    End of Session Equipment Utilized During Treatment: Gait belt Activity Tolerance: Patient limited by fatigue Patient left: in bed;with call bell/phone within reach;with bed alarm set Nurse Communication: Mobility status PT Visit Diagnosis: Unsteadiness on feet (R26.81);Muscle weakness (generalized) (M62.81);Difficulty in walking, not elsewhere classified (R26.2)    Time: 3154-0086 PT Time Calculation (min) (ACUTE ONLY): 16 min   Charges:   PT Evaluation $PT Eval Moderate Complexity: 1 Mod          Reuel Derby, PT, DPT  Acute Rehabilitation Services  Pager: [phone] Office: [phone]   Rudean Hitt 09/07/2020, 1:11 PM

## 2020-09-07 NOTE — Progress Notes (Signed)
[facility]) Hospital Liaison note:  Notified of request for [facility] services. Will continue to follow for disposition.  Please call with any outpatient palliative questions or concerns.  Thank you, Lorelee Market, LPN [facility] Liaison [phone]

## 2020-09-07 NOTE — Progress Notes (Addendum)
Family Medicine Teaching Service Daily Progress Note Intern Pager: [phone]  Patient name: Luke Kaufman Medical record number: [id number] Date of birth: 23-Jul-1950 Age: 70 y.o. Gender: male  Primary Care Provider: Rosaria Ferries, MD Consultants: Palliative Medicine Code Status: DNR/DNI  Pt Overview and Major Events to Date:  5/17- admitted  Assessment and Plan: [name] is 70 yo male that admitted for respiratory distress after recent discharge on 5/16. 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   Sepsis2/2 Acute Cholecystis Received 8 days of Metronidazole (5-13-), Cefepime (5/12-), and Vancomycin (5/12-)  Patient is not a surgical candidate nor does he desire surgery. WBC is stable at 15.6, 15.7 from previous discharge. We will discontinue IV antibiotics, and start PO Augmentin 500mg  BID.  We will monitor labs and physical exam. We will also encourage patient to get up with PT/OT --discontinue IV Metronidazole --discontinue IV Cefepime --discontinue IV Vancomycin --PO Augmentin 500mg  BID --CBC --CMP --PT/OT   L.Upper Extremity Swelling, ongoing LUE noted to have increase swelling over the last 2 days. Patient reports chronic weakness in LUE, but normally no swelling. Physical exam is unchanged, and patient has been on anticoagulation with Eliquis, but we will get a U/S with doppler to rule out DVT. --LUE U/S with doppler  Pulmonary Edem[place]ikely 2/2 Aspiration PNA, resolved Reports breathing comfortably on 4LNC. Hospitalization. Given 8 days of antibiotic treatment per above, aspiration pneumonia should be resolved. We will order a repeat chest-xray this afternoon. --repeat CXR 2-view  --see above --Lasix 40 mg IV x 1 dose.   --Consider IR consult for thoracentesis  Goals of Care Palliative Careconsulted during most recent  admission and clarified goals of care.Goals of care also discussed further withinpatientteam. At this current time patient states that he wants to continue with optimal management, including IV antibiotics, lab draws... He still declines HD, and remains DNR. 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 Currently taking NaHCO3 1300mg . He does not desire starting hemodialysis. -Continue sodium bicarb 1300 mg TID -BMP, Mg, Phos daily -Strict I/O -Daily weights  Hypotension, resolved BP is 105/82 this morning. We will continue to monitor and continue holding BB with low systolic pressures. --Monitor BP --Hold Metoprolol  ParoxysmalAtrial Fibrillation, stable CHA2DS2-VASc score of 5. On anticoagulation with Eliquis 2.5mg  BID --Continue Eliquis 2.5mg  BID --continue holding Metoprolol   HLD: Chronic, stable Not taking a statin.   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 Resides in a nursing home. Without new neurological deficit.   Hx DVT and Pulmonary Embolism In 2019,discovered s/p cardiac arrest.On Eliquis2.5 mg BID. --Eliquis 2.5 BID  SMA occlusion s/pright hemicolectomy and ileostomy in 08/2017 Had repeated surgeries with failed attempts at closure of abdomen. --Wound care; ostomy care and dressing changes  Hx V. Fib Cardiac arrest  In 2020 after emergent surgery for SMA obstruction and mesenteric ischemia. Patient had post-surgical respiratory failure requiring tracheostomy and v-fib cardiac arrest.Following this had afib and was placed on Eliquis.  Depression: Chronic, stable --Continue Sertraline 50 mg daily   BP Home medications include Flomax 0.4mg  and not restarted at admission given hypotension on presentation. We will continue holding for now. --Hold Tamsulosin    FEN/GI: Regular PPx:    Status is:  Inpatient  Remains inpatient appropriate  because:Ongoing diagnostic testing needed not appropriate for outpatient work up and IV treatments appropriate due to intensity of illness or inability to take PO   Dispo: The patient is from: SNF              Anticipated d/c is to: SNF              Patient currently is not medically stable to d/c.   Difficult to place patient No   Subjective:  [name] reports that he is feeling "okay" this morning. He reports no change in his breathing. He is eating and drinking well, with no nausea, vomiting or abdominal pain.   Objective: Temp:  [97.5 F (36.4 C)-98.3 F (36.8 C)] 97.5 F (36.4 C) (05/19 1329) Pulse Rate:  [60-91] 66 (05/19 1329) Resp:  [15-17] 16 (05/19 1329) BP: (105-127)/(69-97) 124/69 (05/19 1329) SpO2:  [94 %-97 %] 94 % (05/19 1329) Physical Exam: General: Sitting in bed comfortably, eating breakfast Cardiovascular: RRR, no murmurs Respiratory: Breathing on 4LNC, normal WOB. Course breath sounds b/l Abdomen: Normoactive bowel sounds, mildly distended in LUQ.  Extremities: LUE pitting edema. Mildly erythematous, shiny skin with no tenderness to palpation.   Laboratory: Recent Labs  Lab 09/05/20 0606 09/06/20 1050 09/07/20 0306  WBC 17.9* 15.7* 15.6*  HGB 9.7* 9.8* 9.7*  HCT 29.9* 29.8* 29.9*  PLT 155 177 185   Recent Labs  Lab 09/05/20 0606 09/06/20 1050 09/07/20 0306  NA 135 135 135  K 4.3 3.5 3.3*  CL 105 103 103  CO2 20* 15* 19*  BUN 100* 101* 97*  CREATININE 5.02* 4.45* 4.05*  CALCIUM 8.0* 8.2* 8.3*  PROT 5.1* 5.2* 5.2*  BILITOT 1.6* 1.2 1.2  ALKPHOS 207* 250* 297*  ALT 70* 67* 61*  AST 154* 110* 89*  GLUCOSE 105* 101* 98    Imaging/Diagnostic Tests:   Jacklynn Bue, Medical Student 09/07/2020, 2:21 PM MS4, [place] Intern pager: [phone], text pages welcome   FPTS Upper-Level Resident Addendum   I have independently interviewed and examined the patient. I have  discussed the above with the original author and agree with their documentation. Please see also any attending notes.    Carollee Leitz MD PGY-2, [place] [facility] 09/07/2020 5:12 PM  [place] Service pager: [phone] (text pages welcome through [facility])

## 2020-09-08 ENCOUNTER — Inpatient Hospital Stay (HOSPITAL_COMMUNITY): Payer: Medicare Other

## 2020-09-08 DIAGNOSIS — R609 Edema, unspecified: Secondary | ICD-10-CM

## 2020-09-08 LAB — CBC
HCT: 29.9 % — ABNORMAL LOW (ref 39.0–52.0)
Hemoglobin: 9.7 g/dL — ABNORMAL LOW (ref 13.0–17.0)
MCH: 29.8 pg (ref 26.0–34.0)
MCHC: 32.4 g/dL (ref 30.0–36.0)
MCV: 91.7 fL (ref 80.0–100.0)
Platelets: 218 10*3/uL (ref 150–400)
RBC: 3.26 MIL/uL — ABNORMAL LOW (ref 4.22–5.81)
RDW: 15.3 % (ref 11.5–15.5)
WBC: 15.3 10*3/uL — ABNORMAL HIGH (ref 4.0–10.5)
nRBC: 0 % (ref 0.0–0.2)

## 2020-09-08 LAB — COMPREHENSIVE METABOLIC PANEL
ALT: 57 U/L — ABNORMAL HIGH (ref 0–44)
AST: 74 U/L — ABNORMAL HIGH (ref 15–41)
Albumin: 1.6 g/dL — ABNORMAL LOW (ref 3.5–5.0)
Alkaline Phosphatase: 355 U/L — ABNORMAL HIGH (ref 38–126)
Anion gap: 10 (ref 5–15)
BUN: 97 mg/dL — ABNORMAL HIGH (ref 8–23)
CO2: 20 mmol/L — ABNORMAL LOW (ref 22–32)
Calcium: 8.4 mg/dL — ABNORMAL LOW (ref 8.9–10.3)
Chloride: 104 mmol/L (ref 98–111)
Creatinine, Ser: 3.91 mg/dL — ABNORMAL HIGH (ref 0.61–1.24)
GFR, Estimated: 16 mL/min — ABNORMAL LOW (ref >60)
Glucose, Bld: 94 mg/dL (ref 70–99)
Potassium: 3.7 mmol/L (ref 3.5–5.1)
Sodium: 134 mmol/L — ABNORMAL LOW (ref 135–145)
Total Bilirubin: 1.1 mg/dL (ref 0.3–1.2)
Total Protein: 5.5 g/dL — ABNORMAL LOW (ref 6.5–8.1)

## 2020-09-08 MED ORDER — TAMSULOSIN HCL 0.4 MG PO CAPS
0.4000 mg | ORAL_CAPSULE | Freq: Every day | ORAL | Status: DC
  Administered 2020-09-08 – 2020-09-11 (×4): 0.4 mg via ORAL
  Filled 2020-09-08 (×4): qty 1

## 2020-09-08 MED ORDER — FUROSEMIDE 10 MG/ML IJ SOLN
40.0000 mg | Freq: Once | INTRAMUSCULAR | Status: AC
  Administered 2020-09-08: 40 mg via INTRAVENOUS
  Filled 2020-09-08: qty 4

## 2020-09-08 NOTE — TOC Initial Note (Signed)
Transition of Care [facility]) - Initial/Assessment Note    Patient Details  Name: Luke Kaufman MRN: [id number] Date of Birth: [birth year]  Transition of Care [facility]) CM/SW Contact:    Emeterio Reeve, [place] Phone Number: 09/08/2020, 3:58 PM  Clinical Narrative:                  CSW met with pt at bedside. CSW introduced self and explained her role at the hospital. Pt reports he is a long term care resident at [facility]. Pt reports he has been there about 3 years. Pt reports they provide all of his care. Pt reports he has no family, just some good friends. Pt plans on returning to [facility] at [place].    Expected Discharge Plan: Skilled Nursing Facility Barriers to Discharge: Continued Medical Work up   Patient Goals and CMS Choice Patient states their goals for this hospitalization and ongoing recovery are:: stay healthy      Expected Discharge Plan and Services Expected Discharge Plan: [place] arrangements for the past 2 months: Single Family Home                                      Prior Living Arrangements/Services Living arrangements for the past 2 months: Single Family Home Lives with:: Facility Resident Patient language and need for interpreter reviewed:: Yes Do you feel safe going back to the place where you live?: Yes      Need for Family Participation in Patient Care: Yes (Comment) Care giver support system in place?: No (comment)   Criminal Activity/Legal Involvement Pertinent to Current Situation/Hospitalization: No - Comment as needed  Activities of Daily Living Home Assistive Devices/Equipment: Ostomy supplies,Wheelchair,Oxygen ADL Screening (condition at time of admission) Patient's cognitive ability adequate to safely complete daily activities?: Yes Is the patient deaf or have difficulty hearing?: No Does the patient have difficulty seeing, even when wearing glasses/contacts?: No Does the patient have difficulty  concentrating, remembering, or making decisions?: No Patient able to express need for assistance with ADLs?: Yes Does the patient have difficulty dressing or bathing?: No Independently performs ADLs?: Yes (appropriate for developmental age) Communication: Independent Does the patient have difficulty walking or climbing stairs?: Yes Weakness of Legs: Both Weakness of Arms/Hands: Right  Permission Sought/Granted Permission sought to share information with : Chartered certified accountant granted to share information with : Yes, Verbal Permission Granted     Permission granted to share info w AGENCY: SNF        Emotional Assessment Appearance:: Appears stated age Attitude/Demeanor/Rapport: Engaged Affect (typically observed): Appropriate Orientation: : Oriented to Self,Oriented to Place,Oriented to  Time,Oriented to Situation Alcohol / Substance Use: Not Applicable Psych Involvement: No (comment)  Admission diagnosis:  Acute pulmonary edema (HCC) [J81.0] Pulmonary edema [J81.1] Acute on chronic respiratory failure (HCC) [J96.20] Acute on chronic respiratory failure with hypoxia (Index) [J96.21] Patient Active Problem List   Diagnosis Date Noted  . Pulmonary edema 09/05/2020  . Aspiration pneumonia ([place]) 09/05/2020  . Acute cholecystitis 09/05/2020  . Leukocytosis 09/05/2020  . Edema of left upper arm 09/05/2020  . Possible mucus plugging of bronchi 09/05/2020  . Dyspnea   . Acute on chronic kidney failure ([place]) 08/31/2020  . Transaminitis   . Non-intractable vomiting   . Metabolic acidosis 81/82/9937  . Acute kidney injury (AKI) with acute tubular necrosis (ATN) ([place]) 11/11/2018  .  Upper airway cough syndrome 03/31/2018  . DOE (dyspnea on exertion) 03/30/2018  . 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 Acute renal failure ([place]) 10/14/2017  . History of pulmonary embolism 10/14/2017  . Cardiac arrest ([place]) 10/14/2017  . Non-STEMI (non-ST  elevated myocardial infarction) ([place])   . Pulmonary embolus, left ([place])   . Left hemiparesis ([place])   . Severe protein-calorie malnutrition ([place])   . Elevated troponin   . Acute on chronic respiratory failure with hypoxia ([place])   . Cardiac arrest ([place])   . AKI (acute kidney injury) ([place])   . Acute respiratory failure with hypoxemia ([place])   . Respiratory failure ([place])   . PUD (peptic ulcer disease) 09/04/2017  . Mesenteric ischemia ([place]) 09/04/2017  . 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 Left arm numbness 12/14/2015  . Numbness 12/14/2015  . Bilateral carotid artery stenosis 11/22/2015  . Recurrent major depressive disorder in partial remission ([place]) 10/19/2015  . Mitral valve prolapse 09/07/2015  . Vitamin D deficiency 08/31/2015  . Arterial atherosclerosis 04/10/2015  . Cataract 04/10/2015  . Cerebrovascular accident, old 04/10/2015  . Gonalgia 04/10/2015  . HLD (hyperlipidemia) 04/10/2015  . 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 Renal artery stenosis ([place]) 04/10/2015  . History of non-ST elevation myocardial infarction (NSTEMI) 04/10/2015  . Anemia associated with acute blood loss 08/24/2014  . Thrombocytopenia ([place])   . Hypokalemia   . Essential hypertension 08/09/2014  . Chest pain 08/09/2014  . Near syncope 08/09/2014  . CKD (chronic kidney disease) stage 3, GFR 30-59 ml/min (HCC) 08/09/2014  . Tobacco abuse 08/09/2014  . Upper GI bleeding   . Gastric ulcer with hemorrhage   . Cerebral infarction ([place]) 05/16/2013  . HTN (hypertension) 05/16/2013  . 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   PCP:  Rosaria Ferries, MD Pharmacy:   [facility] [place] [address] Phone: [phone] Fax: [phone]     Social Determinants of Health (SDOH) Interventions    Readmission Risk Interventions Readmission Risk Prevention Plan 11/16/2018  Transportation Screening  Complete  PCP or Specialist Appt within 3-5 Days Not Complete  Not Complete comments not ready for d/c, from [place] or [place] Complete  Social Work Consult for [place] Planning/Counseling Complete  Palliative Care Screening Not Applicable  Medication Review (RN Care Manager) Complete  Some recent data might be hidden   Emeterio Reeve, Latanya Presser, [place] Social Worker [phone]

## 2020-09-08 NOTE — Plan of Care (Signed)

## 2020-09-08 NOTE — Progress Notes (Signed)
Upper extremity venous has been completed.   Preliminary results in CV Proc.   Abram Sander 09/08/2020 9:07 AM

## 2020-09-08 NOTE — Progress Notes (Signed)
Pt has a stage 2 abdominal wound that covers a large section of his abdomen , covers an area of approximately 8 by 8cm. Wound cleaned with normal saline and redressed with petroleum gauze ad covered with ABD pad.

## 2020-09-08 NOTE — Care Management Important Message (Signed)
Important Message  Patient Details  Name: Luke Kaufman MRN: [id number] Date of Birth: 1950-05-11   Medicare Important Message Given:  Yes     Shatha Hooser 09/08/2020, 2:11 PM

## 2020-09-08 NOTE — NC FL2 (Signed)
[place] LEVEL OF CARE SCREENING TOOL     IDENTIFICATION  Patient Name: Luke Kaufman Birthdate: [birth year] Sex: male Admission Date (Current Location): 09/05/2020  [place] and [place] Number:  Kathleen Argue [id number] [place] and Address:  [facility] [place], [place] [address], [place], [place] [address]      Provider Number: [id number]  Attending Physician Name and Address:  Martyn Malay, MD  Relative Name and Phone Number:  none listed    Current Level of Care: Hospital Recommended Level of Care: [place] Prior Approval Number:    Date Approved/Denied:   PASRR Number: [id number]  Discharge Plan: SNF    Current Diagnoses: Patient Active Problem List   Diagnosis Date Noted  . Pulmonary edema 09/05/2020  . Aspiration pneumonia ([place]) 09/05/2020  . Acute cholecystitis 09/05/2020  . Leukocytosis 09/05/2020  . Edema of left upper arm 09/05/2020  . Possible mucus plugging of bronchi 09/05/2020  . Dyspnea   . Acute on chronic kidney failure ([place]) 08/31/2020  . Transaminitis   . Non-intractable vomiting   . Metabolic acidosis 37/34/2876  . Acute kidney injury (AKI) with acute tubular necrosis (ATN) ([place]) 11/11/2018  . Upper airway cough syndrome 03/31/2018  . DOE (dyspnea on exertion) 03/30/2018  . 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 Acute renal failure ([place]) 10/14/2017  . History of pulmonary embolism 10/14/2017  . Cardiac arrest ([place]) 10/14/2017  . Non-STEMI (non-ST elevated myocardial infarction) ([place])   . Pulmonary embolus, left ([place])   . Left hemiparesis ([place])   . Severe protein-calorie malnutrition ([place])   . Elevated troponin   . Acute on chronic respiratory failure with hypoxia ([place])   . Cardiac arrest ([place])   . AKI (acute kidney injury) ([place])   . Acute respiratory failure with hypoxemia ([place])   . Respiratory failure ([place])   . PUD (peptic ulcer disease) 09/04/2017  . Mesenteric  ischemia ([place]) 09/04/2017  . 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 Left arm numbness 12/14/2015  . Numbness 12/14/2015  . Bilateral carotid artery stenosis 11/22/2015  . Recurrent major depressive disorder in partial remission ([place]) 10/19/2015  . Mitral valve prolapse 09/07/2015  . Vitamin D deficiency 08/31/2015  . Arterial atherosclerosis 04/10/2015  . Cataract 04/10/2015  . Cerebrovascular accident, old 04/10/2015  . Gonalgia 04/10/2015  . HLD (hyperlipidemia) 04/10/2015  . 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 Renal artery stenosis ([place]) 04/10/2015  . History of non-ST elevation myocardial infarction (NSTEMI) 04/10/2015  . Anemia associated with acute blood loss 08/24/2014  . Thrombocytopenia ([place])   . Hypokalemia   . Essential hypertension 08/09/2014  . Chest pain 08/09/2014  . Near syncope 08/09/2014  . CKD (chronic kidney disease) stage 3, GFR 30-59 ml/min (HCC) 08/09/2014  . Tobacco abuse 08/09/2014  . Upper GI bleeding   . Gastric ulcer with hemorrhage   . Cerebral infarction ([place]) 05/16/2013  . HTN (hypertension) 05/16/2013  . 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    Orientation RESPIRATION BLADDER Height & Weight     Self,Time,Situation,Place  O2 Incontinent Weight: 222 lb 10.6 oz (101 kg) Height:  5\' 8"  (172.7 cm)  BEHAVIORAL SYMPTOMS/MOOD NEUROLOGICAL BOWEL NUTRITION STATUS      Incontinent Diet (See dc summary)  AMBULATORY STATUS COMMUNICATION OF NEEDS Skin   Total Care Verbally Other (Comment)                       Personal Care Assistance Level of Assistance  Bathing,Feeding,Dressing,Total care Bathing Assistance: Maximum assistance Feeding assistance: Limited assistance Dressing Assistance: Maximum assistance Total Care Assistance: Maximum assistance   Functional Limitations Info  Sight,Hearing,Speech Sight Info: Adequate Hearing Info: Adequate Speech Info: Adequate    SPECIAL CARE FACTORS FREQUENCY                        Contractures Contractures Info: Not present    Additional Factors Info  Code Status,Allergies Code Status Info: DNR Allergies Info: NKA           Current Medications (09/08/2020):  This is the current hospital active medication list Current Facility-Administered Medications  Medication Dose Route Frequency Provider Last Rate Last Admin  . acetaminophen (TYLENOL) tablet 650 mg  650 mg Oral Q6H PRN Sharion Settler, DO       Or  . acetaminophen (TYLENOL) suppository 650 mg  650 mg Rectal Q6H PRN Espinoza, Alejandra, DO      . amoxicillin-clavulanate (AUGMENTIN) 500-125 MG per tablet 500 mg  1 tablet Oral q12n4p Carollee Leitz, MD   500 mg at 09/08/20 1017  . apixaban (ELIQUIS) tablet 2.5 mg  2.5 mg Oral BID Sharion Settler, DO   2.5 mg at 09/08/20 1017  . ipratropium-albuterol (DUONEB) 0.5-2.5 (3) MG/3ML nebulizer solution 3 mL  3 mL Nebulization Q6H PRN Sharion Settler, DO      . melatonin tablet 3 mg  3 mg Oral QHS Espinoza, Alejandra, DO   3 mg at 09/07/20 2116  . nicotine (NICODERM CQ - dosed in mg/24 hr) patch 7 mg  7 mg Transdermal Daily PRN Sharion Settler, DO      . polyethylene glycol (MIRALAX / GLYCOLAX) packet 17 g  17 g Oral Daily PRN Sharion Settler, DO      . sertraline (ZOLOFT) tablet 50 mg  50 mg Oral Daily Espinoza, Alejandra, DO   50 mg at 09/08/20 1018  . sodium bicarbonate tablet 1,300 mg  1,300 mg Oral TID Sharion Settler, DO   1,300 mg at 09/08/20 1018  . tamsulosin (FLOMAX) capsule 0.4 mg  0.4 mg Oral Daily Paige, Victoria J, DO   0.4 mg at 09/08/20 1339     Discharge Medications: Please see discharge summary for a list of discharge medications.  Relevant Imaging Results:  Relevant Lab Results:   Additional Information SSN: [id number]  Emeterio Reeve, [place]

## 2020-09-08 NOTE — Progress Notes (Addendum)
FMTS Attending Daily Note: Dorris Singh, MD  Team Pager [phone] Pager [phone]  I have seen and examined this patient, reviewed their chart. I have discussed this patient with the resident physician.Edits within note.   I agree with the remainder of the findings, exam, and plan below.   Disposition: Can return to  SNF possibly 5/22 or 5/23 pending volume status/oxygen--needs addition diuresis     Family Medicine Teaching Service Daily Progress Note Intern Pager: [phone]  Patient name: Luke Kaufman Medical record number: [id number] Date of birth: 1951/04/16 Age: 70 y.o. Gender: male  Primary Care Provider: Rosaria Ferries, MD Consultants: Palliative  Code Status: DNR/DNI  Pt Overview and Major Events to Date:  5/17-readmitted for pulmonary edema/pneumonia   Assessment and Plan: 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  Acute Hypoxic Respiratory Failure Pulmonary Edema Patient received IV Lasix 40mg  and had UOP 2.2L. Today his respiratory status remains unchanged and he still is breathing on 4LNC. Uses 2LNC at home. We will wean down today and give an additional dose of IV Lasix 40mg  . He has been treated for hospital acquired pneumonia with vancomycin (MRSA swab positive)  --wean down O2 as tolerated --IV Lasix 40mg  x1  Sepsis2/2 Acute Cholecystis, resolved WBC is stable from admission, 15.7>15.6>15.3 . We transitioned from IV antibiotics to PO Augmentin yesterday. In total he has received 8 days of antibiotics for treatment of his cholecystis  We will continue for 5 more days of treatment.  -- continue PO Augmentin 500mg  BID for total 14 days   L.Upper Extremity Swelling, ongoing LUE noted to have increase swelling over the last 2  days. Patient reports chronic weakness in LUE, but normally no swelling. LUE U/S doppler showed no evidence of DVT.Edema most likely from venous insufficiency. We will elevate and use compression wrap and continue to monitor.  --compression wrap for LUE --keep LUE elevated  CKD Stage IV, not on HD Scr is 3.91, and continues to improve. Patient is currently taking home medication of Sodium Bicarbonate.  --continue NaHCO3 1300mg    ParoxysmalAtrial Fibrillation, stable Hx of DVT/PE Hx V. Fib Cardiac arrest  In 2020 after emergent surgery for SMA obstruction and mesenteric ischemia. Patient had post-surgical respiratory failure requiring tracheostomy and v-fib cardiac arrest CHA2DS2-VASc score of 5. On anticoagulation with Eliquis 2.5mg  BID, but we will increase Eliquis to therapeutic dose. Home medication of Metoprolol 12.5mg  was held in the setting of hypotension at presentation.  --Eliquis 5mg  BID --continue holding Metoprolol   HTN, stable Blood Pressure have ranged between normotensive over the last 24hrs. He will need outpatient follow up.   HLD: Chronic, stable Currently not taking a statin, atorvastatin was held at last discharge due to elevated CK. He should follow up outpatient about management.   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 Resides in a nursing home. Without new neurological deficit.   SMA occlusion s/pright hemicolectomy and ileostomy in 08/2017 Had repeated surgeries with failed attempts at closure of abdomen. --Wound care; ostomy care and dressing changes  Depression: Chronic, stable Home medication of Sertraline 50 mg daily has been continued during admission. --continue Sertraline 50mg  daily  BP Home medications include Flomax 0.4mg  and not restarted at admission given hypotension on presentation. BP is 143/78 and stable.  We will restart today. --start Tamsulosin 0.4mg   Goals of Care Palliative Careconsulted  during most recent admission and clarified goals of care.Goals of care also discussed further withinpatientteam. At this current time patient states that he wants to continue with optimal management, including IV antibiotics, lab draws... He still declines HD, and remains DNR. --f/u Palliative Consult   FEN/GI: Regular  PPx: Eliquis 5mg  BID   Status is: Inpatient  Remains inpatient appropriate because:Inpatient level of care appropriate due to severity of illness   Dispo: The patient is from: SNF              Anticipated d/c is to: SNF              Patient currently is medically stable to d/c.   Difficult to place patient No  Subjective:  [name] reports that he is feeling okay this morning. No change in respiratory status. He is okay with going home soon.   Objective: Temp:  [97.5 F (36.4 C)-98.7 F (37.1 C)] 98 F (36.7 C) (05/20 0445) Pulse Rate:  [66-97] 70 (05/20 0445) Resp:  [16-22] 20 (05/20 0445) BP: (124-143)/(69-78) 143/78 (05/20 0445) SpO2:  [93 %-94 %] 94 % (05/20 0445) Weight:  [101 kg] 101 kg (05/20 0500) Physical Exam: General: Laying in bed comfortably. Cardiovascular: RRR, no mumurs Respiratory: Breathing comfortably on 4LNC. Normal WOB. Course breath sounds.  Abdomen- soft, non tender, ostomy pink with + Luke Kaufman output  Extremities: Dry cracked skin on lower extremities b/l. Swelling in LUE, no tenderness to palpation.   Laboratory: Recent Labs  Lab 09/06/20 1050 09/07/20 0306 09/08/20 0047  WBC 15.7* 15.6* 15.3*  HGB 9.8* 9.7* 9.7*  HCT 29.8* 29.9* 29.9*  PLT 177 185 218   Recent Labs  Lab 09/06/20 1050 09/07/20 0306 09/08/20 0047  NA 135 135 134*  K 3.5 3.3* 3.7  CL 103 103 104  CO2 15* 19* 20*  BUN 101* 97* 97*  CREATININE 4.45* 4.05* 3.91*  CALCIUM 8.2* 8.3* 8.4*  PROT 5.2* 5.2* 5.5*  BILITOT 1.2 1.2 1.1  ALKPHOS 250* 297* 355*  ALT 67* 61* 57*  AST 110* 89* 74*  GLUCOSE 101* 98 94      Imaging/Diagnostic  Tests:   Luke Kaufman, Medical Student 09/08/2020, 7:18 AM MS4, [place] Intern pager: [phone], text pages welcome  Resident Attestation  I saw and evaluated the patient, performing the key elements of the service.I  personally performed or re-performed the history, physical exam, and medical decision making activities of this service and have verified that the service and findings are accurately documented in the student's note. I developed the management plan that is described in the medical student's note, and I agree with the content, with my edits above.    Gifford Shave, PGY2

## 2020-09-09 LAB — CBC
HCT: 27.3 % — ABNORMAL LOW (ref 39.0–52.0)
Hemoglobin: 8.9 g/dL — ABNORMAL LOW (ref 13.0–17.0)
MCH: 29.9 pg (ref 26.0–34.0)
MCHC: 32.6 g/dL (ref 30.0–36.0)
MCV: 91.6 fL (ref 80.0–100.0)
Platelets: 223 10*3/uL (ref 150–400)
RBC: 2.98 MIL/uL — ABNORMAL LOW (ref 4.22–5.81)
RDW: 15.4 % (ref 11.5–15.5)
WBC: 13.3 10*3/uL — ABNORMAL HIGH (ref 4.0–10.5)
nRBC: 0 % (ref 0.0–0.2)

## 2020-09-09 LAB — COMPREHENSIVE METABOLIC PANEL
ALT: 45 U/L — ABNORMAL HIGH (ref 0–44)
AST: 48 U/L — ABNORMAL HIGH (ref 15–41)
Albumin: 1.6 g/dL — ABNORMAL LOW (ref 3.5–5.0)
Alkaline Phosphatase: 315 U/L — ABNORMAL HIGH (ref 38–126)
Anion gap: 13 (ref 5–15)
BUN: 92 mg/dL — ABNORMAL HIGH (ref 8–23)
CO2: 21 mmol/L — ABNORMAL LOW (ref 22–32)
Calcium: 8.6 mg/dL — ABNORMAL LOW (ref 8.9–10.3)
Chloride: 100 mmol/L (ref 98–111)
Creatinine, Ser: 3.78 mg/dL — ABNORMAL HIGH (ref 0.61–1.24)
GFR, Estimated: 16 mL/min — ABNORMAL LOW (ref >60)
Glucose, Bld: 98 mg/dL (ref 70–99)
Potassium: 3.5 mmol/L (ref 3.5–5.1)
Sodium: 134 mmol/L — ABNORMAL LOW (ref 135–145)
Total Bilirubin: 0.8 mg/dL (ref 0.3–1.2)
Total Protein: 5.2 g/dL — ABNORMAL LOW (ref 6.5–8.1)

## 2020-09-09 LAB — SARS CORONAVIRUS 2 (TAT 6-24 HRS): SARS Coronavirus 2: NEGATIVE

## 2020-09-09 MED ORDER — FUROSEMIDE 10 MG/ML IJ SOLN
40.0000 mg | Freq: Four times a day (QID) | INTRAMUSCULAR | Status: AC
  Administered 2020-09-09 (×2): 40 mg via INTRAVENOUS
  Filled 2020-09-09 (×2): qty 4

## 2020-09-09 NOTE — Consult Note (Addendum)
[place] Nurse ostomy consult note Stoma type/location: RLQ ileostomy since 2019. Last seen by my associate Marlou Porch, RN, WTA  on 08/31/20. Stomal assessment/size: 1 inch, round, raised, red, moist. Lumen in center. Peristomal assessment: Intact Treatment options for stomal/peristomal skin: skin barrier ring Output: brown liquid Ostomy pouching: 2pc. 2 and 1/4 inch pouching system with skin barrier ring added Education provided: None today Enrolled patient in [place] program: No. Patient resides in a facility and is not independent.   WOC Nurse Consult Note: Reason for Consult:Chronic, nonhealing abdominal wound with mesh Wound type: SUrgical Pressure Injury POA: N/A Measurement:5cm x 10cm x 0.2cm Wound bed:red, moist, friable Drainage (amount, consistency, odor) small amount serosanguinoius Periwound:intact with evidence of previous wound healing (scarring) Dressing procedure/placement/frequency: I will implement a conservative POC using twice daily NS dressings topped with an ABD pad and secured with paper tape. A sacral bordered form is ordered for pressure injury prophylaxis and heels are to be floated to prevent Pressure Injury in those locations.  Turning and repositioning per house protocol is already in place.   [place] nursing team will not follow, but will remain available to this patient, the nursing and medical teams.  Please re-consult if needed. Thanks, Maudie Flakes, MSN, RN, [place], Arther Abbott  Pager# [phone]

## 2020-09-09 NOTE — Progress Notes (Addendum)
Family Medicine Teaching Service Daily Progress Note Intern Pager: [phone]  Patient name: Sena Clouatre Medical record number: [id number] Date of birth: 1950-11-16 Age: 70 y.o. Gender: male  Primary Care Provider: Rosaria Ferries, MD Consultants: Palliative Code Status: DNR/DNI  Pt Overview and Major Events to Date:  5/17 readmit for pulm edema / pnumonia  Assessment and Plan: Mr.Olano is a 70 yo M w/ PMH of CAD/MI in 2015, PVD, CVA, A.fib, DVT/PE on Eliquis, SMA occlusion s/p R hemicolectomy and ileostomy in 5/19, and CKD 4 admit for resp distress after recent d/c on 5/16 due to pulm edema and pneumonia.  Acute on chronic hypoxic resp failure (2L at home) 2/2 pulmonary edema Has hx of V.fib arrest but most recent Echo with EF 60-65%. Pulm edema likely due to worsening renal fx. 2.1L urine output overnight. Currently on IV furosemide 40mg . Creatinine improving (4.05->3.91->3.78). Weight improving 101kg->98kg. Still mildly hypervolemic on exam. O2 sat 92 on 3L this am -  C/w gentle diuresis at IV furosemide 40mg  - Trend renal fx - Strict I&Os - Daily weights - Fluid restriction - Keep O2 sat >88, wean to  Home O2 as tolerated - COVID testing in anticipation of upcoming d/c to SNF  Acute kidney injury on chronic kidney disease stage 4 Baseline creatinine 2.8. Admit creatinine 6.0. Improving with diuresis (4.05->3.91->3.78). K 3.5 - C/w gentle diuresis - Trend renal fx - C/w home meds: bicarb 1300mg  TID  Acute cholecystitis, resolving Abx course: IV Flagyl 5/12-> Augmentin 5/16 -> vanc/cefepime/flagyl 5/17-> Augmentin 5/19. Day 9 / 14. Afebrile overnight. Denies any abdominal pain. Leukocytosis improving 15.6->15.3->13.3. Significant stool output yesterday (1.5L) from stoma. Has hx of hemicolectomy and isolestomy. - C/w Augmentin 500mg  BID (end date 09/14/20) - Trend cbc  LUE extremity swelling Noted mild swelling on LUE. Mentions hx of chronic weakness. US/doppler  w/o evidence of DVT. - C/w compression - Keep LUE elevated  Paroxysmal A.fib Hx of DVT/PE Hx of CVA Am HR 69. CHADSVASC of 5. 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 MCV 92. Cbc downtrend. 9.7->8.9. No evidence of bleed - Trend cbc - C/w monitor  Goals of Care Palliative care on board. Continues to decline HD. Full scope of care for other treatments. Remains DNR.  - Appreciate palliative recs  FEN/GI: Regular PPx: Eliquis  Status is: Inpatient  Remains inpatient appropriate because:Inpatient level of care appropriate due to severity of illness   Dispo: The patient is from: SNF              Anticipated d/c is to: SNF              Patient currently is not medically stable to d/c.   Difficult to place patient No   Subjective:  Mr.Drumwright was examined and evaluated at bedside this am. Denies any complaints overnight. Has been having no respiratory distress or productive cough. Mentions no change in sensation / strength / pain on LUE  Objective: Temp:  [98.2 F (36.8 C)-98.5 F (36.9 C)] 98.4 F (36.9 C) (05/21 0538) Pulse Rate:  [69-78] 69 (05/21 0538) Resp:  [16] 16 (05/21 0538) BP: (120-124)/(57-79) 124/57 (05/21 0538) SpO2:  [92 %-93 %] 92 % (05/21 0538) Physical Exam:  Gen: Well-developed, NAD HEENT: NCAT head, MMM CV: RRR, S1, S2 normal, No rubs, no  murmurs, no gallops Pulm: Breathing comfortably on 3L, Course breath sounds, Mild bibasilar rales, Abd: Soft, NTND, Ostomy site intact with brown output, chronic wound covered with bandaging Extm: ROM intact, Peripheral pulses intact, + non-pitting edmea LUE  Laboratory: Recent Labs  Lab 09/07/20 0306 09/08/20 0047 09/09/20 0239  WBC 15.6* 15.3* 13.3*  HGB 9.7* 9.7* 8.9*  HCT 29.9* 29.9* 27.3*  PLT 185 218 223   Recent  Labs  Lab 09/07/20 0306 09/08/20 0047 09/09/20 0239  NA 135 134* 134*  K 3.3* 3.7 3.5  CL 103 104 100  CO2 19* 20* 21*  BUN 97* 97* 92*  CREATININE 4.05* 3.91* 3.78*  CALCIUM 8.3* 8.4* 8.6*  PROT 5.2* 5.5* 5.2*  BILITOT 1.2 1.1 0.8  ALKPHOS 297* 355* 315*  ALT 61* 57* 45*  AST 89* 74* 48*  GLUCOSE 98 94 98   Imaging/Diagnostic Tests:  Mosetta Anis, MD 09/09/2020, 6:40 AM PGY-3, [place] Intern pager: [phone], text pages welcome

## 2020-09-09 NOTE — Progress Notes (Signed)
Covid test sent to Lab awaiting for results

## 2020-09-10 LAB — CBC
HCT: 30 % — ABNORMAL LOW (ref 39.0–52.0)
Hemoglobin: 9.8 g/dL — ABNORMAL LOW (ref 13.0–17.0)
MCH: 29.8 pg (ref 26.0–34.0)
MCHC: 32.7 g/dL (ref 30.0–36.0)
MCV: 91.2 fL (ref 80.0–100.0)
Platelets: 260 10*3/uL (ref 150–400)
RBC: 3.29 MIL/uL — ABNORMAL LOW (ref 4.22–5.81)
RDW: 15.6 % — ABNORMAL HIGH (ref 11.5–15.5)
WBC: 15.7 10*3/uL — ABNORMAL HIGH (ref 4.0–10.5)
nRBC: 0 % (ref 0.0–0.2)

## 2020-09-10 LAB — COMPREHENSIVE METABOLIC PANEL
ALT: 45 U/L — ABNORMAL HIGH (ref 0–44)
AST: 46 U/L — ABNORMAL HIGH (ref 15–41)
Albumin: 1.7 g/dL — ABNORMAL LOW (ref 3.5–5.0)
Alkaline Phosphatase: 339 U/L — ABNORMAL HIGH (ref 38–126)
Anion gap: 12 (ref 5–15)
BUN: 88 mg/dL — ABNORMAL HIGH (ref 8–23)
CO2: 25 mmol/L (ref 22–32)
Calcium: 8.7 mg/dL — ABNORMAL LOW (ref 8.9–10.3)
Chloride: 97 mmol/L — ABNORMAL LOW (ref 98–111)
Creatinine, Ser: 3.77 mg/dL — ABNORMAL HIGH (ref 0.61–1.24)
GFR, Estimated: 16 mL/min — ABNORMAL LOW (ref >60)
Glucose, Bld: 104 mg/dL — ABNORMAL HIGH (ref 70–99)
Potassium: 3.4 mmol/L — ABNORMAL LOW (ref 3.5–5.1)
Sodium: 134 mmol/L — ABNORMAL LOW (ref 135–145)
Total Bilirubin: 1.2 mg/dL (ref 0.3–1.2)
Total Protein: 6 g/dL — ABNORMAL LOW (ref 6.5–8.1)

## 2020-09-10 LAB — MAGNESIUM: Magnesium: 1.5 mg/dL — ABNORMAL LOW (ref 1.7–2.4)

## 2020-09-10 MED ORDER — FUROSEMIDE 10 MG/ML IJ SOLN
40.0000 mg | Freq: Once | INTRAMUSCULAR | Status: AC
  Administered 2020-09-10: 40 mg via INTRAVENOUS
  Filled 2020-09-10: qty 4

## 2020-09-10 MED ORDER — MAGNESIUM SULFATE 2 GM/50ML IV SOLN
2.0000 g | Freq: Once | INTRAVENOUS | Status: AC
  Administered 2020-09-10: 2 g via INTRAVENOUS
  Filled 2020-09-10: qty 50

## 2020-09-10 MED ORDER — POTASSIUM CHLORIDE CRYS ER 20 MEQ PO TBCR
40.0000 meq | EXTENDED_RELEASE_TABLET | ORAL | Status: AC
Start: 2020-09-10 — End: 2020-09-10
  Administered 2020-09-10 (×2): 40 meq via ORAL
  Filled 2020-09-10 (×2): qty 2

## 2020-09-10 NOTE — Discharge Summary (Signed)
[facility] Discharge Summary  Patient name: Luke Kaufman Medical record number: [id number] Date of birth: 1951-02-08 Age: 70 y.o. Gender: male Date of Admission: 09/05/2020  Date of Discharge: 09/11/2020 Admitting Physician: Blane Ohara McDiarmid, MD  Primary Care Provider: Rosaria Ferries, MD Consultants: Palliative  Indication for Hospitalization: Acute on chronic hypoxic respiratory failure secondary to pulmonary edema  Discharge Diagnoses/Problem List:  1.  Acute on chronic hypoxic respiratory failure 2/2 pulmonary edema 2.  Acute kidney injury on chronic kidney disease stage IV, not on HD 3.  Acute cholecystitis, resolving 4.  LUE extremity swelling 5.  Paroxysmal A. fib  Disposition: SNF  Discharge Condition: Stable  Discharge Exam:  General: Elderly male lying comfortably in bed, NAD Cardiology: Regular rate and rhythm, no murmurs rubs or gallops Pulmonary: Mild crackles at bases bilaterally Abdomen: Soft, nontender, nondistended, bowel sounds present Skin: Warm and dry Extremities: No edema appreciated  Brief Hospital Course:  Luke Kaufman is a 70 y.o. male presenting with shortness of breath and admitted for acute on chronic hypoxic respiratory failure secondary to pulmonary edema.  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 Fib arrest, PVD, CVA, hypertension, hyperlipidemia, tobacco use, chronic hypoxic respiratory failure on oxygen at night, PUD with GI bleed, history of DVT and PE.  Acute on chronic hypoxic respiratory failure (2L at home prn) 2/2 pulmonary edema Hypoxic to 87% and in mild respiratory distress on arrival to ED, placed on Bi-PAP with improvement in oxygen saturations. CXR concerning for possible re-expansion of pulmonary edema. Labs work deferred in ED, however patient is amenable to this. Patient was discharged a few hours prior to arrival. He was hospitalized for  acute renal failure and sepsis likely from aspiration pneumonia. He required supplemental oxygen and declined BiPAP at that time. MRSA nasal swab positive at that time and he was treated with cefepime and vanc from 5/12-5/16.  Suspect that this is an exacerbation of prior symptoms. Continued back on IV antibiotics and continue respiratory support.    Goals of Care Palliative Care consulted during most recent admission and clarified goals of care. Goals of care also discussed further with inpatient team. Patient decided on comfort measures only and declined dialysis, pressors, surgery or escalation of care if he decompensates. He was discharged back to his long-term care facility with hospice but returned via [facility] prior to arriving at his facility.  Patient tells me that he is agreeable to be admitted for continued respiratory support.  He is also agreeable to IV antibiotics and blood work.  He is aware of his chronic conditions and likelihood of poor prognosis.  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  Creatinine on discharge 5.45 (baseline appears to be 2-3).  Patient was started on sodium bicarb last admission.  He does not desire starting hemodialysis.    Hypotension BP ranging 78-145/44/96. Held Metoprolol 12.5mg  during admission.    ? Bacteremia, likely contaminant  1/4 of the blood cultures drawn on 5/11 grew gram positive rods. This is likely a contaminant. The other set of blood cultures showed no growth in 5 days.   Paroxysmal Atrial Fibrillation CHA2DS2-VASc score of 5. Cardiology consulted on prior admission and patient was started on Metoprolol 12.5 mg BID for rate control. Discharged on Eliquis. Cardiology recommended amiodarone if recurrent afib with RVR or to up-titrate beta blocker. During my encounter HR irregularly irregular in 90's and low  100's. He is asymptomatic. Given his hypotension, held metoprolol at this time.      Gallstones  Acute Cholecystitis  Diagnosed by HIDA on recent admission. Surgery consulted at that time and plan was to finish 14-day course of abx. Patient is not a surgical candidate nor does he desire surgery. Started on IV Metronidazole 5/13 but ultimately discharged with instructions to take Augmentin x2 weeks. Restarted Metronidazole at admission.   All other chronic conditions stable     Issues for Follow Up:  1. CBC and BMP in 1 week by PCP 2. Cardiology f/u, started on Lasix and K+ 3. Nephrology f/u for scr   Significant Procedures: None  Significant Labs and Imaging:  Recent Labs  Lab 09/09/20 0239 09/10/20 0205 09/11/20 0200  WBC 13.3* 15.7* 13.5*  HGB 8.9* 9.8* 9.7*  HCT 27.3* 30.0* 29.9*  PLT 223 260 259   Recent Labs  Lab 09/05/20 0606 09/06/20 1050 09/07/20 0306 09/08/20 0047 09/09/20 0239 09/10/20 0205 09/11/20 0200  NA 135 135 135 134* 134* 134* 136  K 4.3 3.5 3.3* 3.7 3.5 3.4* 3.6  CL 105 103 103 104 100 97* 98  CO2 20* 15* 19* 20* 21* 25 27  GLUCOSE 105* 101* 98 94 98 104* 113*  BUN 100* 101* 97* 97* 92* 88* 83*  CREATININE 5.02* 4.45* 4.05* 3.91* 3.78* 3.77* 3.63*  CALCIUM 8.0* 8.2* 8.3* 8.4* 8.6* 8.7* 8.8*  MG 1.7  --  1.9  --   --  1.5* 1.9  PHOS 4.7*  --   --   --   --   --   --   ALKPHOS 207* 250* 297* 355* 315* 339*  --   AST 154* 110* 89* 74* 48* 46*  --   ALT 70* 67* 61* 57* 45* 45*  --   ALBUMIN 1.6* 1.6* 1.5* 1.6* 1.6* 1.7*  --    DG Chest 2 View  Result Date: 09/07/2020 CLINICAL DATA:  Respiratory distress press EXAM: CHEST - 2 VIEW COMPARISON:  09/05/2020 FINDINGS: Cardiac shadow is stable. Increasing bilateral pleural effusions and likely underlying basilar atelectasis is present right slightly greater than left. No pneumothorax is noted. No bony abnormality is seen. IMPRESSION: Increasing basilar opacities and effusions right greater than left. Electronically Signed   By: Inez Catalina M.D.   On: 09/07/2020 15:50   VAS [place] UPPER  EXTREMITY VENOUS DUPLEX  Result Date: 09/08/2020 UPPER VENOUS STUDY  Patient Name:  Luke Kaufman  Date of Exam:   09/08/2020 Medical Rec #: [id number]        Accession #:    [id number] Date of Birth: 29-May-1950        Patient Gender: M Patient Age:   70Y Exam Location:  [facility] Procedure:      VAS [place] UPPER EXTREMITY VENOUS DUPLEX Referring Phys: [id number] CARINA M BROWN --------------------------------------------------------------------------------  Indications: Edema Limitations: Patient cooperation. Comparison Study: 10/01/17 prior Performing Technologist: Abram Sander RVS  Examination Guidelines: A complete evaluation includes B-mode imaging, spectral Doppler, color Doppler, and power Doppler as needed of all accessible portions of each vessel. Bilateral testing is considered an integral part of a complete examination. Limited examinations for reoccurring indications may be performed as noted.  Right Findings: +-----+------------+---------+-----------+----------+--------------+ RIGHTCompressiblePhasicitySpontaneousProperties   Summary     +-----+------------+---------+-----------+----------+--------------+ IJV  Not visualized +-----+------------+---------+-----------+----------+--------------+  Left Findings: +----------+------------+---------+-----------+----------+--------------+ LEFT      CompressiblePhasicitySpontaneousProperties   Summary     +----------+------------+---------+-----------+----------+--------------+ IJV           Full       Yes       Yes                             +----------+------------+---------+-----------+----------+--------------+ Subclavian    Full       Yes       Yes                             +----------+------------+---------+-----------+----------+--------------+ Axillary      Full       Yes       Yes                              +----------+------------+---------+-----------+----------+--------------+ Brachial      Full       Yes       Yes                             +----------+------------+---------+-----------+----------+--------------+ Radial        Full                                                 +----------+------------+---------+-----------+----------+--------------+ Ulnar                                               Not visualized +----------+------------+---------+-----------+----------+--------------+ Cephalic      Full                                                 +----------+------------+---------+-----------+----------+--------------+ Basilic       Full                                                 +----------+------------+---------+-----------+----------+--------------+  Summary:  Left: No evidence of deep vein thrombosis in the upper extremity. No evidence of superficial vein thrombosis in the upper extremity. No evidence of thrombosis in the subclavian.  *See table(s) above for measurements and observations.  Diagnosing physician: Deitra Mayo MD Electronically signed by Deitra Mayo MD on 09/08/2020 at 8:20:11 PM.    Final     Results/Tests Pending at Time of Discharge: N/A  Discharge Medications:  Allergies as of 09/11/2020   No Known Allergies     Medication List    STOP taking these medications   metoprolol tartrate 25 MG tablet Commonly known as: LOPRESSOR     TAKE these medications   acetaminophen 325 MG tablet Commonly known as: TYLENOL Place 2 tablets (650 mg total) into feeding tube every 6 (six) hours. What changed:   how to take this  when to take this  reasons  to take this   amoxicillin-clavulanate 500-125 MG tablet Commonly known as: AUGMENTIN Take 1 tablet (500 mg total) by mouth 2 times daily at 12 noon and 4 pm for 4 days. What changed: when to take this   apixaban 2.5 MG Tabs tablet Commonly known as: ELIQUIS Take 2.5  mg by mouth 2 (two) times daily.   ascorbic acid 500 MG tablet Commonly known as: VITAMIN C Take 500 mg by mouth daily.   cholestyramine 4 g packet Commonly known as: QUESTRAN Take 4 g by mouth daily.   ergocalciferol 1.25 MG (50000 UT) capsule Commonly known as: VITAMIN D2 Take 50,000 Units by mouth once a week. On thursdays   fluticasone 50 MCG/ACT nasal spray Commonly known as: FLONASE Place 1 spray into both nostrils daily.   furosemide 20 MG tablet Commonly known as: LASIX Take 1 tablet (20 mg total) by mouth daily.   ipratropium-albuterol 0.5-2.5 (3) MG/3ML Soln Commonly known as: DUONEB Take 3 mLs by nebulization every 6 (six) hours as needed.   melatonin 3 MG Tabs tablet Take 1 tablet (3 mg total) by mouth at bedtime.   nicotine 7 mg/24hr patch Commonly known as: NICODERM CQ - dosed in mg/24 hr Place 1 patch (7 mg total) onto the skin daily as needed (nicotine withdrawal).   ondansetron 4 MG tablet Commonly known as: ZOFRAN Take 4 mg by mouth every 8 (eight) hours as needed for nausea or vomiting.   OXYGEN Place 2 L/min into the nose at bedtime. Nasal cannula   polyethylene glycol 17 g packet Commonly known as: MIRALAX / GLYCOLAX Take 17 g by mouth daily as needed for mild constipation.   potassium chloride SA 20 MEQ tablet Commonly known as: KLOR-CON Take 1 tablet (20 mEq total) by mouth daily.   PROBIOTIC PO Take 1 tablet by mouth daily.   sertraline 50 MG tablet Commonly known as: ZOLOFT Take 50 mg by mouth daily.   sodium bicarbonate 650 MG tablet Take 1,300 mg by mouth 3 (three) times daily.   tamsulosin 0.4 MG Caps capsule Commonly known as: FLOMAX Take 0.4 mg by mouth daily.   Vitamin D3 Ultra Potency 1.25 MG (50000 UT) Tabs Generic drug: Cholecalciferol Take 1.25 mg by mouth once a week. On Thursday's            Discharge Care Instructions  (From admission, onward)         Start     Ordered   09/11/20 0000  Discharge wound  care:       Comments: Follow wound care recommendations   09/11/20 1142          Discharge Instructions: Please refer to Patient Instructions section of EMR for full details.  Patient was counseled important signs and symptoms that should prompt return to medical care, changes in medications, dietary instructions, activity restrictions, and follow up appointments.   Follow-Up Appointments: D/C to SNF   Gracyn Santillanes, Meredith Staggers, MD 09/11/2020, 11:42 AM PGY-1, [place]

## 2020-09-10 NOTE — TOC Progression Note (Signed)
Transition of Care [facility]) - Progression Note    Patient Details  Name: Luke Kaufman MRN: [id number] Date of Birth: Apr 21, 1951  Transition of [facility]) CM/SW [place], [place] Phone Number: 09/10/2020, 4:11 PM  Clinical Narrative:    CSW attempted to make contact with pt's SNF for possible discharging to facility today.  CSW not able to reached facility.  Pt may be able to discharge on Monday. Pt is a long term resident of facility. TOC will follow up with facility on Monday for disposition planning.   Expected Discharge Plan: Skilled Nursing Facility Barriers to Discharge: Continued Medical Work up  Expected Discharge Plan and Services Expected Discharge Plan: [place]       Living arrangements for the past 2 months: Single Family Home                                       Social Determinants of Health (SDOH) Interventions    Readmission Risk Interventions Readmission Risk Prevention Plan 11/16/2018  Transportation Screening Complete  PCP or Specialist Appt within 3-5 Days Not Complete  Not Complete comments not ready for d/c, from [place] or [place] Complete  Social Work Consult for [place] Planning/Counseling Complete  Palliative Care Screening Not Applicable  Medication Review Press photographer) Complete  Some recent data might be hidden

## 2020-09-10 NOTE — Progress Notes (Signed)
Family Medicine Teaching Service Daily Progress Note Intern Pager: [phone]  Patient name: Anna Livers Medical record number: [id number] Date of birth: 01/05/1951 Age: 70 y.o. 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  Acute on chronic hypoxic resp failure (2L at home prn) 2/2 pulmonary edema.  Improving. Most recent EF 60-65%. Urine output 2.45L in last 24 hrs. Weight back to admission weight. Diuresed with Lasix 40 IV daily.  Received extra dose yesterday.  Cr remains stable.  On exam he is on room air.  Coarse lung sounds at bases but improving.   -Diurese with Lasix 40mg  IV x 1 today -Daily weights -Strict I&O's -Fluid restrict -[place] respiratory status  -Maintain O2 >88 % -Wean oxygen to baseline home O2 of 2-3L at night -OOB daily -PT/OT -Incentive spirometer q2h while awake -Turn q2h while in bed -BMP in am -COVID testing for SNF discharge possibly today or tomorrow  Acute kidney injury on chronic kidney disease stage 4 Improving Baseline Cr 2.8. On admission was 6.0.  Today 3.77 and has been trending downward with diuresing.   -Continue to diurese with Lasix 40 mg IV  -Continue home sodium bicarb 1300 mg TID -BMP in am -Daily weights -Strict I&O's -Avoid nephrotoxic agents  Acute cholecystitis, resolving Incidental finding.  Patient has never reported any signigicant abdominal pain.   Received antibiotic course of: IV Flagyl 05/12->Augmentin 05/16 ->Vanc/Cefepime/Flagyl 05/17->Augmentin 05/19.  Now on day 10 of 14 day course.  Stop date 05/26. Afebrile  overnight.  Slight increase in WBC today.  Stool volume has increased, 2.55L in last 24 hrs. Has ileostomy s/p hemicolectomy. On exam green liquid stool noted in bag.   -Continue Augmentin -Monitior fever curve -CBC in am  LUE extremity swelling Chronic weakness in left upper extremity.  Recent doppler negative for DVT. On exam no change in edema.  Pulses present. -Continue to keep extremity elevated -Continue upper extremity compression stocking  Paroxysmal A.fib H/O DVT/PE/CVA Rate controlled with am HR 69.  On Eliquis 2.5mg  at home -Continue Eliquis 2.5 mg BID, consider increasing to 5 mg BID now that kidney functions improving.  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  Benign abdominal exam -WOCN following, appreciate recommendations  Anemia of Chronic Dz Stable. Hbg 9.8 today, trended upward from yesterday. No signs of bleeding -CBC q2d  Goals of Care Palliative following. Patient is DNR but wants Full scope of medical care.  Plan for palliative to follow at SNF -Appreciate recommendations -Possible discharge in the next 1-2 days.   FEN/GI: Regular PPx: Eliquis   Status is: Inpatient  Remains inpatient appropriate because:[place] for discharge   Dispo: The patient is from: SNF              Anticipated d/c is to: SNF              Patient currently is medically stable to d/c.   Difficult to place patient No  Subjective:  No acute events overnight.  Feeling better.  No complaints.  Waiting to go home.  Currently on room air.  Has not  been out of bed since admission.  Appetite ok.    Objective: Temp:  [97.9 F (36.6 C)-98.7 F (37.1 C)] 97.9 F (36.6 C) (05/22 0534) Pulse Rate:  [61-76] 69 (05/22 0534) Resp:  [17-18] 17 (05/22 0534) BP: (112-121)/(53-64) 121/64 (05/22 0534) SpO2:  [90 %-91 %] 91 % (05/22 0534) Weight:  [98 kg] 98 kg (05/22 0500)  Physical Exam:   General: 70 y.o. male in NAD Cardio: RRR no  m/r/g Lungs: Coarse crackles at bases but has improved,  no IWOB on room air Abdomen: Soft, non-tender to palpation, non-distended, positive bowel sounds Skin: warm and dry Extremities: Mild LUE chronic dependent edema, no lower extremity edema  Laboratory: Recent Labs  Lab 09/08/20 0047 09/09/20 0239 09/10/20 0205  WBC 15.3* 13.3* 15.7*  HGB 9.7* 8.9* 9.8*  HCT 29.9* 27.3* 30.0*  PLT 218 223 260   Recent Labs  Lab 09/08/20 0047 09/09/20 0239 09/10/20 0205  NA 134* 134* 134*  K 3.7 3.5 3.4*  CL 104 100 97*  CO2 20* 21* 25  BUN 97* 92* 88*  CREATININE 3.91* 3.78* 3.77*  CALCIUM 8.4* 8.6* 8.7*  PROT 5.5* 5.2* 6.0*  BILITOT 1.1 0.8 1.2  ALKPHOS 355* 315* 339*  ALT 57* 45* 45*  AST 74* 48* 46*  GLUCOSE 94 98 104*      Imaging/Diagnostic Tests: No results found.  Carollee Leitz, MD 09/10/2020, 6:53 AM PGY-2, [place] Intern pager: [phone], text pages welcome

## 2020-09-11 LAB — CBC
HCT: 29.9 % — ABNORMAL LOW (ref 39.0–52.0)
Hemoglobin: 9.7 g/dL — ABNORMAL LOW (ref 13.0–17.0)
MCH: 30 pg (ref 26.0–34.0)
MCHC: 32.4 g/dL (ref 30.0–36.0)
MCV: 92.6 fL (ref 80.0–100.0)
Platelets: 259 10*3/uL (ref 150–400)
RBC: 3.23 MIL/uL — ABNORMAL LOW (ref 4.22–5.81)
RDW: 15.6 % — ABNORMAL HIGH (ref 11.5–15.5)
WBC: 13.5 10*3/uL — ABNORMAL HIGH (ref 4.0–10.5)
nRBC: 0 % (ref 0.0–0.2)

## 2020-09-11 LAB — BASIC METABOLIC PANEL
Anion gap: 11 (ref 5–15)
BUN: 83 mg/dL — ABNORMAL HIGH (ref 8–23)
CO2: 27 mmol/L (ref 22–32)
Calcium: 8.8 mg/dL — ABNORMAL LOW (ref 8.9–10.3)
Chloride: 98 mmol/L (ref 98–111)
Creatinine, Ser: 3.63 mg/dL — ABNORMAL HIGH (ref 0.61–1.24)
GFR, Estimated: 17 mL/min — ABNORMAL LOW (ref >60)
Glucose, Bld: 113 mg/dL — ABNORMAL HIGH (ref 70–99)
Potassium: 3.6 mmol/L (ref 3.5–5.1)
Sodium: 136 mmol/L (ref 135–145)

## 2020-09-11 LAB — GLUCOSE, CAPILLARY
Glucose-Capillary: 95 mg/dL (ref 70–99)
Glucose-Capillary: 131 mg/dL — ABNORMAL HIGH (ref 70–99)

## 2020-09-11 LAB — MAGNESIUM: Magnesium: 1.9 mg/dL (ref 1.7–2.4)

## 2020-09-11 MED ORDER — MELATONIN 3 MG PO TABS: 3.0000 mg | ORAL_TABLET | Freq: Every day | ORAL | 0 refills | Status: AC

## 2020-09-11 MED ORDER — NICOTINE 7 MG/24HR TD PT24: 7.0000 mg | MEDICATED_PATCH | Freq: Every day | TRANSDERMAL | 0 refills | Status: AC | PRN

## 2020-09-11 MED ORDER — APIXABAN 5 MG PO TABS: 5.0000 mg | ORAL_TABLET | Freq: Two times a day (BID) | ORAL | 0 refills | Status: AC

## 2020-09-11 MED ORDER — AMOXICILLIN-POT CLAVULANATE 500-125 MG PO TABS: 1.0000 | ORAL_TABLET | Freq: Two times a day (BID) | ORAL | 0 refills | Status: AC

## 2020-09-11 MED ORDER — FUROSEMIDE 20 MG PO TABS: 20.0000 mg | ORAL_TABLET | Freq: Every day | ORAL | 0 refills | Status: AC

## 2020-09-11 MED ORDER — FUROSEMIDE 20 MG PO TABS
20.0000 mg | ORAL_TABLET | Freq: Every day | ORAL | Status: DC
  Administered 2020-09-11: 20 mg via ORAL
  Filled 2020-09-11: qty 1

## 2020-09-11 MED ORDER — POTASSIUM CHLORIDE CRYS ER 20 MEQ PO TBCR
40.0000 meq | EXTENDED_RELEASE_TABLET | Freq: Once | ORAL | Status: AC
  Administered 2020-09-11: 40 meq via ORAL
  Filled 2020-09-11: qty 2

## 2020-09-11 MED ORDER — POTASSIUM CHLORIDE CRYS ER 20 MEQ PO TBCR: 20.0000 meq | EXTENDED_RELEASE_TABLET | Freq: Every day | ORAL | Status: AC

## 2020-09-11 MED ORDER — POLYETHYLENE GLYCOL 3350 17 G PO PACK: 17.0000 g | PACK | Freq: Every day | ORAL | 0 refills | Status: AC | PRN

## 2020-09-11 MED ORDER — APIXABAN 5 MG PO TABS: 5.0000 mg | ORAL_TABLET | Freq: Two times a day (BID) | ORAL | Status: DC

## 2020-09-11 MED ORDER — MAGNESIUM SULFATE IN D5W 1-5 GM/100ML-% IV SOLN
1.0000 g | Freq: Once | INTRAVENOUS | Status: AC
  Administered 2020-09-11: 1 g via INTRAVENOUS
  Filled 2020-09-11: qty 100

## 2020-09-11 NOTE — Progress Notes (Signed)
Discharge instructions provided to the pt, then given to ambulance services. IV removed. Patient discharged.

## 2020-09-11 NOTE — Progress Notes (Signed)
Physical Therapy Treatment Patient Details Name: Luke Kaufman MRN: [id number] DOB: [birth year] Today's Date: 09/11/2020    History of Present Illness Pt is a 70 y/o male admitted 5/17 from SNF secondary to increased DOE and respiratory distress. Thought to be secondary to pulmonary edema from aspiration PNA. Pt with recent admission for SOB. PMH includes CAD, PVD, CVA, HTN, tobacco use, PE, colostomy and chronic abdominal wound.    PT Comments    Pt appeared more alert and attentive than previous sessions. Pt assisted with transfer to edge of bed and was able to sit EOB with min guard assist. Pt complete LAQs at EOB. Pt then became nauseous, pale, and was requesting to lay down. Pts BP taken and was 57/29. Pt immediately returned to supine in bed. BP 112/58. RN notified. Acute PT to continue to work with patient to improved functional mobility.   Follow Up Recommendations  SNF;Supervision/Assistance - 24 hour     Equipment Recommendations  None recommended by PT    Recommendations for Other Services       Precautions / Restrictions Precautions Precautions: Fall;Other (comment) Precaution Comments: non-healing abdominal wound, colostomy Restrictions Weight Bearing Restrictions: No    Mobility  Bed Mobility Overal bed mobility: Needs Assistance Bed Mobility: Rolling;Sidelying to Sit;Sit to Supine Rolling: Mod assist Sidelying to sit: Max assist;HOB elevated   Sit to supine: Total assist;+2 for physical assistance   General bed mobility comments: with step by step verbal cues pt able to reach across body with with R UE to reach for L bedrail and initiated pulling self over into L sidelying, maxA for trunk elevation to sit EOB    Transfers                 General transfer comment: deferred due to pt report "I am going to be sick" BP 57/29 at EOB x 3 min, had to return to supine  Ambulation/Gait             General Gait Details: unable this date   Stairs              Wheelchair Mobility    Modified Rankin (Stroke Patients Only)       Balance Overall balance assessment: Needs assistance Sitting-balance support: Feet supported;Single extremity supported Sitting balance-Leahy Scale: Fair Sitting balance - Comments: no physical assist to maintain EOB balance, pt with kyphosis and no L UE functional use                                    Cognition Arousal/Alertness: Awake/alert Behavior During Therapy: Flat affect Overall Cognitive Status: No family/caregiver present to determine baseline cognitive functioning                                 General Comments: pt engaging in coversation but with delayed response time, was able to follow simple 1 step commands      Exercises General Exercises - Lower Extremity Long Arc Quad: AROM;Both;5 reps;Seated (prior to the beginning of feeling sick)    General Comments General comments (skin integrity, edema, etc.): SpO2 >92% on 4LO2      Pertinent Vitals/Pain Pain Assessment: Faces Faces Pain Scale: No hurt    Home Living                      Prior  Function            PT Goals (current goals can now be found in the care plan section) Progress towards PT goals: Progressing toward goals    Frequency    Min 2X/week      PT Plan Current plan remains appropriate    Co-evaluation              AM-PAC PT "6 Clicks" Mobility   Outcome Measure  Help needed turning from your back to your side while in a flat bed without using bedrails?: A Lot Help needed moving from lying on your back to sitting on the side of a flat bed without using bedrails?: A Lot Help needed moving to and from a bed to a chair (including a wheelchair)?: Total Help needed standing up from a chair using your arms (e.g., wheelchair or bedside chair)?: Total Help needed to walk in hospital room?: Total Help needed climbing 3-5 steps with a railing? : Total 6 Click  Score: 8    End of Session Equipment Utilized During Treatment: Gait belt Activity Tolerance: Other (comment) (limited by significant drop in BP) Patient left: in bed Nurse Communication: Mobility status (drop in BP to 57/29) PT Visit Diagnosis: Unsteadiness on feet (R26.81);Muscle weakness (generalized) (M62.81);Difficulty in walking, not elsewhere classified (R26.2)     Time: 4765-4650 PT Time Calculation (min) (ACUTE ONLY): 19 min  Charges:  $Therapeutic Activity: 8-22 mins                     Kittie Plater, PT, DPT Acute Rehabilitation Services Pager #: [phone] Office #: [phone]    Berline Lopes 09/11/2020, 10:26 AM

## 2020-09-11 NOTE — Progress Notes (Signed)
Report given to nurse at [facility]. AVS given to ambulance services

## 2020-09-11 NOTE — TOC Transition Note (Signed)
Transition of Care [facility]) - CM/SW Discharge Note   Patient Details  Name: Luke Kaufman MRN: [id number] Date of Birth: 10/26/1950  Transition [facility]) CM/SW Contact:  Emeterio Reeve, [place] Phone Number: 09/11/2020, 11:16 AM   Clinical Narrative:     Patient will DC to: [name] Anticipated DC date: 09/11/20 Family notified: None  Transport by: Corey Harold     Per MD patient ready for DC to [name] care. RN, patient, patient's family, and facility notified of DC. Discharge Summary and FL2 sent to facility. Pt is covid negative. DC packet on chart. Ambulance transport requested for patient.    RN to call report to [phone].   CSW will sign off for now as social work intervention is no longer needed. Please consult [place] again if new needs arise.   Final next level of care: Skilled Nursing Facility Barriers to Discharge: Barriers Resolved   Patient Goals and CMS Choice Patient states their goals for this hospitalization and ongoing recovery are:: stay healthy      Discharge Placement              Patient chooses bed at: [name] Patient to be transferred to facility by: [facility] Name of family member notified: none Patient and family notified of of transfer: 09/11/20  Discharge Plan and Services                                     Social Determinants of Health ([place]) Interventions     Readmission Risk Interventions Readmission Risk Prevention Plan 11/16/2018  Transportation Screening Complete  PCP or Specialist Appt within 3-5 Days Not Complete  Not Complete comments not ready for d/c, from [place] [facility] SNF  [place] or [place] Complete  Social Work Consult for [place] Planning/Counseling Complete  Palliative Care Screening Not Applicable  Medication Review Press photographer) Complete  Some recent data might be hidden    Emeterio Reeve, Latanya Presser, [place] Social Worker [phone]

## 2020-09-26 ENCOUNTER — Other Ambulatory Visit: Payer: Self-pay

## 2020-09-26 ENCOUNTER — Non-Acute Institutional Stay: Payer: Medicare Other | Admitting: Hospice

## 2020-09-26 DIAGNOSIS — F339 Major depressive disorder, recurrent, unspecified: Secondary | ICD-10-CM

## 2020-09-26 DIAGNOSIS — Z515 Encounter for palliative care: Secondary | ICD-10-CM

## 2020-09-26 DIAGNOSIS — N185 Chronic kidney disease, stage 5: Secondary | ICD-10-CM

## 2020-09-26 DIAGNOSIS — R0600 Dyspnea, unspecified: Secondary | ICD-10-CM

## 2020-09-26 DIAGNOSIS — R0609 Other forms of dyspnea: Secondary | ICD-10-CM

## 2020-09-26 NOTE — Progress Notes (Addendum)
Luke Kaufman Consult Note Telephone: [phone]  Fax: [phone]  PATIENT NAME: Luke Kaufman [address] [place] [address] [phone] (home)  DOB: 1950-08-04 MRN: [id number]  PRIMARY CARE PROVIDER:    Rosaria Ferries, MD,  [place] [address] [phone]  REFERRING PROVIDER:   Dr. Garwin Brothers RESPONSIBLE PARTY:   Self Patient reports he does not have family Contact Information   None on File      I met face to face with patient at facility. Palliative Care was asked to follow this patient by consultation request of  Dr. Garwin Brothers to address advance care planning, complex medical decision making and goals of care clarification. This is the initial visit.    ASSESSMENT AND / RECOMMENDATIONS:   Advance Care Planning: Our advance care planning conversation included a discussion about:     The value and importance of advance care planning   Difference between Hospice and Palliative care  Exploration of goals of care in the event of a sudden injury or illness   Identification and preparation of a healthcare agent   Review and updating or creation of an  advance directive document .  Decision not to resuscitate or to de-escalate disease focused treatments due to poor prognosis.  CODE STATUS: Patient affirmed he is a Do Not Resuscitate.  Goals of Care: Goals include to maximize quality of life and symptom management.  MOST form selections include feeding tube for defined trial period, IV fluids long-term if indicated, antibiotics if indicated, limited additional intervention.  Both DNR and MOST forms in epic.  I spent 25 minutes providing this initial consultation. More than 50% of the time in this consultation was spent on counseling patient and coordinating  communication. -----------------------------------------------------------------------------------------------------------------  Symptom Management/Plan: Dyspnea: Oxygen supplementation 2 L/min as needed for dyspnea.  Smoking cessation discussed. Depression: Followed by psych: Continue sertraline as ordered.  Encourage participation in facility activities. CKD 5: Patient declines hemodialysis; elects no aggressive care. Continue sodium bicarbonate as ordered.  Avoid nephrotoxic substances. Routine CBC BMP. Follow up: Palliative care will continue to follow for complex medical decision making, advance care planning, and clarification of goals. Return 6 weeks or prn.Encouraged to call provider sooner with any concerns.   Family /Caregiver/Community Supports: Patient in SNF for ongoing care  PPS: 40%   HOSPICE ELIGIBILITY/DIAGNOSIS: TBD  Chief Complaint: Initial Palliative care visit: Dyspnea  HISTORY OF PRESENT ILLNESS:  Luke Kaufman is a 70 y.o. year old male  with multiple medical conditions including dyspnea which is acute on chronic, with recent hospitalization 70/17- 70/23/2022 for acute on chronic hypoxic respiratory failure Secondary to pulmonary edema.  Dyspnea impairs independence in ADLs, and is worse on exertion and helped with rest.  Patient is currently off continuous oxygen now on oxygen supplementation 2 L/min as needed.  History of CAD/MI in 2015, A. fib, tobacco use, SMA occlusion status post right hemicolectomy and ileostomy, CKD stage V, not on hemodialysis. History obtained from review of EMR, discussion with primary team, caregiver, family and/or Mr. Alford Highland.  Review and summarization of Epic records shows history from other than patient. Rest of 10 point ROS asked and negative.     Review of lab tests/diagnostics   Results for BRYTEN, MAHER (MRN [id number]) as of 09/26/2020 14:40  Ref. Range 09/11/2020 02:00  Sodium Latest Ref Range: 135 - 145 mmol/L 136  Potassium  Latest Ref Range: 3.5 - 5.1 mmol/L 3.6  Chloride Latest Ref  Range: 98 - 111 mmol/L 98  CO2 Latest Ref Range: 22 - 32 mmol/L 27  Glucose Latest Ref Range: 70 - 99 mg/dL 113 (H)  BUN Latest Ref Range: 8 - 23 mg/dL 83 (H)  Creatinine Latest Ref Range: 0.61 - 1.24 mg/dL 3.63 (H)  Calcium Latest Ref Range: 8.9 - 10.3 mg/dL 8.8 (L)  Anion gap Latest Ref Range: 5 - 15  11  Magnesium Latest Ref Range: 1.7 - 2.4 mg/dL 1.9  GFR, Estimated Latest Ref Range: >60 mL/min 17 (L)    ROS General: NAD EYES: denies vision changes ENMT: denies dysphagia Cardiovascular: denies chest pain/discomfort Pulmonary: denies cough, denies SOB at rest; endorses dyspnea on exertion Abdomen: endorses good appetite, denies constipation/diarrhea GU: denies dysuria, urinary frequency MSK:  denies weakness,  no falls reported Skin: denies rashes or wounds Neurological: denies pain, denies insomnia Psych: Endorses positive mood Heme/lymph/immuno: denies bruises, abnormal bleeding  Physical Exam: Constitutional: NAD General: Well groomed, cooperative EYES: anicteric sclera, lids intact, no discharge  ENMT: Moist mucous membrane CV: S1 S2, RRR, no LE edema Pulmonary: Oxygen supplementation as needed for dyspnea Abdomen: active BS + 4 quadrants, colostomy present; clean intact dressing to chronic wound followed by facility wound nurse GU: no suprapubic tenderness MSK: Left hemiparesis  limited ROM Skin: warm and dry, no rashes or wounds on visible skin Neuro:  weakness, otherwise non focal Psych: non-anxious affect Hem/lymph/immuno: no widespread bruising   PAST MEDICAL HISTORY:  Active Ambulatory Problems    Diagnosis Date Noted  . Cerebral infarction ([place]) 05/16/2013  . HTN (hypertension) 05/16/2013  . 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 Essential hypertension 08/09/2014  . Chest pain 08/09/2014  . Near syncope 08/09/2014  . CKD (chronic kidney disease) stage 3, GFR 30-59 ml/min (HCC) 08/09/2014  .  Tobacco abuse 08/09/2014  . Upper GI bleeding   . Gastric ulcer with hemorrhage   . Thrombocytopenia ([place])   . Hypokalemia   . Anemia associated with acute blood loss 08/24/2014  . Arterial atherosclerosis 04/10/2015  . Cataract 04/10/2015  . Cerebrovascular accident, old 04/10/2015  . Gonalgia 04/10/2015  . HLD (hyperlipidemia) 04/10/2015  . 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 Renal artery stenosis ([place]) 04/10/2015  . Bilateral carotid artery stenosis 11/22/2015  . History of non-ST elevation myocardial infarction (NSTEMI) 04/10/2015  . Left arm numbness 12/14/2015  . Mitral valve prolapse 09/07/2015  . Numbness 12/14/2015  . Recurrent major depressive disorder in partial remission ([place]) 10/19/2015  . Vitamin D deficiency 08/31/2015  . PUD (peptic ulcer disease) 09/04/2017  . Mesenteric ischemia ([place]) 09/04/2017  . 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 Respiratory failure ([place])   . Acute respiratory failure with hypoxemia ([place])   . AKI (acute kidney injury) ([place])   . Acute on chronic respiratory failure with hypoxia ([place])   . Cardiac arrest ([place])   . Elevated troponin   . Non-STEMI (non-ST elevated myocardial infarction) ([place])   . Pulmonary embolus, left ([place])   . Left hemiparesis ([place])   . Severe protein-calorie malnutrition ([place])   . 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 Acute renal failure ([place]) 10/14/2017  . History of pulmonary embolism 10/14/2017  . Cardiac arrest ([place]) 10/14/2017  . DOE (dyspnea on exertion) 03/30/2018  . Upper airway cough syndrome 03/31/2018  . Acute kidney injury (AKI) with acute tubular necrosis (ATN) ([place]) 11/11/2018  . Metabolic acidosis 95/12/3265  . Transaminitis   . Non-intractable vomiting   . Acute on chronic  kidney failure ([place]) 08/31/2020  . Dyspnea   . Pulmonary edema 09/05/2020  . Aspiration pneumonia ([place]) 09/05/2020  . Acute cholecystitis 09/05/2020  . Leukocytosis 09/05/2020  .  Edema of left upper arm 09/05/2020  . Possible mucus plugging of bronchi 09/05/2020   Resolved Ambulatory Problems    Diagnosis Date Noted  . Upper GI bleed 08/09/2014  . Hematemesis with nausea   . Acute blood loss anemia   . Abdominal pain 09/04/2017  . Acute deep vein thrombosis (DVT) of distal end of left lower extremity ([place])   . Acute deep vein thrombosis (DVT) of radial vein of right upper extremity ([place])   . Acute encephalopathy   . Acute metabolic encephalopathy 49/44/9675   Past Medical History:  Diagnosis Date  . Gastric ulcer   . Hyperlipemia   . Hypertension   . Hypospadias   . MI (myocardial infarction) ([place])   . Pulmonary embolism and infarction ([place]) 10/14/2017  . Stroke [facility])     SOCIAL HX:  Social History   Tobacco Use  . Smoking status: Former Smoker    Packs/day: 2.00    Years: 50.00    Pack years: 100.00    Types: Cigarettes    Quit date: 08/20/2017    Years since quitting: 3.1  . Smokeless tobacco: Never Used  Substance Use Topics  . Alcohol use: Yes    Alcohol/week: 0.0 standard drinks    Comment: occasional     FAMILY HX:  Family History  Problem Relation Age of Onset  . Pancreatic cancer Mother   . Heart attack Father 71      ALLERGIES: No Known Allergies    PERTINENT MEDICATIONS:  Outpatient Encounter Medications as of 09/26/2020  Medication Sig  . acetaminophen (TYLENOL) 325 MG tablet Place 2 tablets (650 mg total) into feeding tube every 6 (six) hours. (Patient taking differently: Take 650 mg by mouth every 6 (six) hours as needed for mild pain, fever or headache.)  . apixaban (ELIQUIS) 5 MG TABS tablet Take 1 tablet (5 mg total) by mouth 2 (two) times daily.  [name] ascorbic acid (VITAMIN C) 500 MG tablet Take 500 mg by mouth daily.  . Cholecalciferol (VITAMIN D3 ULTRA POTENCY) 1.25 MG (50000 UT) TABS Take 1.25 mg by mouth once a week. On Thursday's  . cholestyramine (QUESTRAN) 4 g packet Take 4 g by mouth daily.  . ergocalciferol  (VITAMIN D2) 1.25 MG (50000 UT) capsule Take 50,000 Units by mouth once a week. On thursdays  . fluticasone (FLONASE) 50 MCG/ACT nasal spray Place 1 spray into both nostrils daily.  . furosemide (LASIX) 20 MG tablet Take 1 tablet (20 mg total) by mouth daily.  [name] ipratropium-albuterol (DUONEB) 0.5-2.5 (3) MG/3ML SOLN Take 3 mLs by nebulization every 6 (six) hours as needed.  . melatonin 3 MG TABS tablet Take 1 tablet (3 mg total) by mouth at bedtime.  . nicotine (NICODERM CQ - DOSED IN MG/24 HR) 7 mg/24hr patch Place 1 patch (7 mg total) onto the skin daily as needed (nicotine withdrawal).  . ondansetron (ZOFRAN) 4 MG tablet Take 4 mg by mouth every 8 (eight) hours as needed for nausea or vomiting.  . OXYGEN Place 2 L/min into the nose at bedtime. Nasal cannula  . polyethylene glycol (MIRALAX / GLYCOLAX) 17 g packet Take 17 g by mouth daily as needed for mild constipation.  . potassium chloride SA (KLOR-CON) 20 MEQ tablet Take 1 tablet (20 mEq total) by mouth daily.  . Probiotic  Product (PROBIOTIC PO) Take 1 tablet by mouth daily.  . sertraline (ZOLOFT) 50 MG tablet Take 50 mg by mouth daily.  . sodium bicarbonate 650 MG tablet Take 1,300 mg by mouth 3 (three) times daily.  . tamsulosin (FLOMAX) 0.4 MG CAPS capsule Take 0.4 mg by mouth daily.   No facility-administered encounter medications on file as of 09/26/2020.     Thank you for the opportunity to participate in the care of Mr. Bianchini.  The palliative care team will continue to follow. Please call our office at [phone] if we can be of additional assistance.   Note: Portions of this note were generated with Lobbyist. Dictation errors may occur despite best attempts at proofreading.  Teodoro Spray, NP

## 2023-02-20 ENCOUNTER — Encounter (HOSPITAL_COMMUNITY): Payer: Self-pay
